# Patient Record
Sex: Female | Born: 1950
Health system: Southern US, Community
[De-identification: ages and names within clinical notes are randomized; demographics above are authoritative.]

## PROBLEM LIST (undated history)

## (undated) DIAGNOSIS — N189 Chronic kidney disease, unspecified: Secondary | ICD-10-CM

## (undated) DIAGNOSIS — E039 Hypothyroidism, unspecified: Secondary | ICD-10-CM

## (undated) DIAGNOSIS — F419 Anxiety disorder, unspecified: Secondary | ICD-10-CM

## (undated) DIAGNOSIS — I10 Essential (primary) hypertension: Secondary | ICD-10-CM

## (undated) DIAGNOSIS — R011 Cardiac murmur, unspecified: Secondary | ICD-10-CM

## (undated) DIAGNOSIS — K219 Gastro-esophageal reflux disease without esophagitis: Secondary | ICD-10-CM

## (undated) DIAGNOSIS — M109 Gout, unspecified: Secondary | ICD-10-CM

## (undated) DIAGNOSIS — K859 Acute pancreatitis without necrosis or infection, unspecified: Secondary | ICD-10-CM

## (undated) DIAGNOSIS — Z972 Presence of dental prosthetic device (complete) (partial): Secondary | ICD-10-CM

## (undated) DIAGNOSIS — Z973 Presence of spectacles and contact lenses: Secondary | ICD-10-CM

## (undated) DIAGNOSIS — M1990 Unspecified osteoarthritis, unspecified site: Secondary | ICD-10-CM

## (undated) DIAGNOSIS — E119 Type 2 diabetes mellitus without complications: Secondary | ICD-10-CM

## (undated) DIAGNOSIS — I350 Nonrheumatic aortic (valve) stenosis: Secondary | ICD-10-CM

## (undated) HISTORY — DX: Nonrheumatic aortic (valve) stenosis: I35.0

## (undated) HISTORY — DX: Type 2 diabetes mellitus without complications: E11.9

## (undated) HISTORY — PX: COLONOSCOPY: SHX174

## (undated) HISTORY — PX: BACK SURGERY: SHX140

## (undated) HISTORY — PX: CHOLECYSTECTOMY: SHX55

## (undated) HISTORY — DX: Morbid (severe) obesity due to excess calories: E66.01

---

## 1998-12-20 ENCOUNTER — Other Ambulatory Visit: Admission: RE | Admit: 1998-12-20 | Discharge: 1998-12-20 | Payer: Self-pay | Admitting: Gynecology

## 1999-11-08 ENCOUNTER — Other Ambulatory Visit: Admission: RE | Admit: 1999-11-08 | Discharge: 1999-11-08 | Payer: Self-pay | Admitting: Gynecology

## 2002-12-31 ENCOUNTER — Ambulatory Visit (HOSPITAL_COMMUNITY): Admission: RE | Admit: 2002-12-31 | Discharge: 2002-12-31 | Payer: Self-pay | Admitting: Gastroenterology

## 2005-02-03 ENCOUNTER — Ambulatory Visit: Payer: Self-pay | Admitting: *Deleted

## 2005-02-03 ENCOUNTER — Ambulatory Visit (HOSPITAL_COMMUNITY): Admission: RE | Admit: 2005-02-03 | Discharge: 2005-02-03 | Payer: Self-pay | Admitting: Family Medicine

## 2005-02-07 ENCOUNTER — Ambulatory Visit: Payer: Self-pay | Admitting: *Deleted

## 2005-02-16 ENCOUNTER — Encounter (HOSPITAL_COMMUNITY): Admission: RE | Admit: 2005-02-16 | Discharge: 2005-02-17 | Payer: Self-pay | Admitting: *Deleted

## 2005-02-17 ENCOUNTER — Ambulatory Visit: Payer: Self-pay | Admitting: Cardiology

## 2005-02-28 ENCOUNTER — Encounter (HOSPITAL_COMMUNITY): Admission: RE | Admit: 2005-02-28 | Discharge: 2005-03-01 | Payer: Self-pay | Admitting: Family Medicine

## 2005-03-03 ENCOUNTER — Ambulatory Visit: Payer: Self-pay | Admitting: *Deleted

## 2005-03-31 ENCOUNTER — Encounter (HOSPITAL_COMMUNITY): Admission: RE | Admit: 2005-03-31 | Discharge: 2005-06-29 | Payer: Self-pay | Admitting: Endocrinology

## 2008-01-24 ENCOUNTER — Ambulatory Visit (HOSPITAL_COMMUNITY): Admission: RE | Admit: 2008-01-24 | Discharge: 2008-01-24 | Payer: Self-pay | Admitting: Family Medicine

## 2009-10-29 ENCOUNTER — Ambulatory Visit (HOSPITAL_COMMUNITY): Admission: RE | Admit: 2009-10-29 | Discharge: 2009-10-29 | Payer: Self-pay | Admitting: Family Medicine

## 2009-11-10 ENCOUNTER — Ambulatory Visit: Admission: RE | Admit: 2009-11-10 | Discharge: 2009-11-10 | Payer: Self-pay | Admitting: Family Medicine

## 2009-11-17 ENCOUNTER — Ambulatory Visit (HOSPITAL_COMMUNITY): Admission: RE | Admit: 2009-11-17 | Discharge: 2009-11-17 | Payer: Self-pay | Admitting: Family Medicine

## 2010-08-05 NOTE — Op Note (Signed)
   NAME:  Sabrina Deleon, Sabrina Deleon                           ACCOUNT NO.:  [id number]   MEDICAL RECORD NO.:  [id number]                   PATIENT TYPE:  AMB   LOCATION:  ENDO                                 FACILITY:  [facility]   PHYSICIAN:  Graylin Shiver, M.D.                DATE OF BIRTH:  12/30/1950   DATE OF PROCEDURE:  12/31/2002  DATE OF DISCHARGE:                                 OPERATIVE REPORT   PROCEDURE:  Colonoscopy.   INDICATIONS FOR PROCEDURE:  Rectal bleeding, screening.   Informed consent was obtained after explanation of the risks of bleeding,  infection and perforation.   PREMEDICATION:  Fentanyl 75 mcg IV, Versed 9 mg IV.   DESCRIPTION OF PROCEDURE:  With the patient in the left lateral decubitus  position, a rectal exam was performed and no masses were felt. The Olympus  colonoscope was then inserted into the rectum and advanced around the colon  to the cecum. Cecal landmarks were identified. The cecum and ascending colon  were normal. The transverse colon was normal.  The descending colon, sigmoid  and rectum were normal. The scope was retroflexed in the rectum, some small  internal hemorrhoids were noted. The patient also had a couple of small  external hemorrhoidal tags. She tolerated the procedure well without  complications.   IMPRESSION:  Normal colonoscopy with the presence of hemorrhoids.                                               Graylin Shiver, M.D.    [name]  D:  12/31/2002  T:  12/31/2002  Job:  [id number]   cc:   Meredith Staggers, M.D.  [address], [address]  [place]  [place] [address]  Fax: [phone]   Scott A. Gerda Diss, M.D.  [address], [address]  [place]  [place] [address]  Fax: [phone]

## 2010-08-05 NOTE — Procedures (Signed)
[name], [name]                 ACCOUNT NO.:  [id number]   MEDICAL RECORD NO.:  [id number]          PATIENT TYPE:  OUT   LOCATION:  RAD                           FACILITY:  [facility]   PHYSICIAN:  Vida Roller, M.D.   DATE OF BIRTH:  [birth year]   DATE OF PROCEDURE:  02/03/2005  DATE OF DISCHARGE:                                  ECHOCARDIOGRAM   PRIMARY CARE PHYSICIAN:  Scott A. Gerda Diss, M.D.   Tape number [id number].  Tape count [id number].   INDICATIONS:  This is a 60 year old woman with shortness of breath.  Technical of the study is poor.  There is multiple nonstandard views.   M-MODE TRACINGS:  The aorta is 33 mm.   Left atrium 46 mm.   Septum is 13 mm.   Posterior wall 13 mm.   Left ventricular diastolic dimension is 56 mm.   Left ventricular systolic dimension 38 mm.   Two-dimensional echocardiogram and Doppler imaging:  The left ventricle is  normal size.  There is preserved LV systolic function and estimated ejection  fraction 55-60%.  No wall motion abnormalities.  Mild LVH is seen which is  concentric.   Right ventricle is normal size with normal systolic function.   Both atria are enlarged.   The aortic valve is sclerotic with mild aortic stenosis peak gradient of 22  mmHg.  Technical quality of the study was poor and therefore an aortic valve  area was not obtained.   The mitral valve has and moderate to severe annular calcification with mild  regurgitation.   Tricuspid valve with moderate regurgitation.  No RVSP was calculated.   There is no pericardial effusion.   Inferior vena cava appeared to be normal size.      Vida Roller, M.D.  Electronically Signed     JH/MEDQ  D:  02/03/2005  T:  02/03/2005  Job:  [id number]

## 2011-10-27 ENCOUNTER — Other Ambulatory Visit: Payer: Self-pay | Admitting: Orthopedic Surgery

## 2011-10-30 ENCOUNTER — Encounter (HOSPITAL_BASED_OUTPATIENT_CLINIC_OR_DEPARTMENT_OTHER): Payer: Self-pay | Admitting: *Deleted

## 2011-10-30 NOTE — Progress Notes (Signed)
To come in for labs and ekg-to bring all meds dos Does not use a cpap-moderate osa

## 2011-10-31 ENCOUNTER — Ambulatory Visit (HOSPITAL_BASED_OUTPATIENT_CLINIC_OR_DEPARTMENT_OTHER)
Admission: RE | Admit: 2011-10-31 | Discharge: 2011-10-31 | Disposition: A | Discharge status: Home / Self Care | Payer: BC Managed Care – PPO | Source: Ambulatory Visit | Attending: Orthopedic Surgery | Admitting: Orthopedic Surgery

## 2011-10-31 DIAGNOSIS — Z01810 Encounter for preprocedural cardiovascular examination: Secondary | ICD-10-CM | POA: Insufficient documentation

## 2011-10-31 LAB — BASIC METABOLIC PANEL
BUN: 31 mg/dL — ABNORMAL HIGH (ref 6–23)
CO2: 29 mEq/L (ref 19–32)
Chloride: 99 mEq/L (ref 96–112)
Creatinine, Ser: 1.16 mg/dL — ABNORMAL HIGH (ref 0.50–1.10)
GFR calc non Af Amer: 50 mL/min — ABNORMAL LOW (ref >90)
Glucose, Bld: 127 mg/dL — ABNORMAL HIGH (ref 70–99)
Potassium: 4.2 mEq/L (ref 3.5–5.1)
Sodium: 137 mEq/L (ref 135–145)

## 2011-11-02 ENCOUNTER — Encounter (HOSPITAL_BASED_OUTPATIENT_CLINIC_OR_DEPARTMENT_OTHER): Payer: Self-pay | Admitting: *Deleted

## 2011-11-02 ENCOUNTER — Ambulatory Visit (HOSPITAL_BASED_OUTPATIENT_CLINIC_OR_DEPARTMENT_OTHER)
Admission: RE | Admit: 2011-11-02 | Discharge: 2011-11-02 | Disposition: A | Discharge status: Home / Self Care | Payer: BC Managed Care – PPO | Source: Ambulatory Visit | Attending: Orthopedic Surgery | Admitting: Orthopedic Surgery

## 2011-11-02 ENCOUNTER — Ambulatory Visit (HOSPITAL_BASED_OUTPATIENT_CLINIC_OR_DEPARTMENT_OTHER): Payer: BC Managed Care – PPO | Admitting: *Deleted

## 2011-11-02 ENCOUNTER — Encounter (HOSPITAL_BASED_OUTPATIENT_CLINIC_OR_DEPARTMENT_OTHER)
Admission: RE | Disposition: A | Discharge status: Home / Self Care | Payer: Self-pay | Source: Ambulatory Visit | Attending: Orthopedic Surgery

## 2011-11-02 ENCOUNTER — Encounter (HOSPITAL_BASED_OUTPATIENT_CLINIC_OR_DEPARTMENT_OTHER): Payer: Self-pay

## 2011-11-02 DIAGNOSIS — G4730 Sleep apnea, unspecified: Secondary | ICD-10-CM | POA: Insufficient documentation

## 2011-11-02 DIAGNOSIS — Z01812 Encounter for preprocedural laboratory examination: Secondary | ICD-10-CM | POA: Insufficient documentation

## 2011-11-02 DIAGNOSIS — I10 Essential (primary) hypertension: Secondary | ICD-10-CM | POA: Insufficient documentation

## 2011-11-02 DIAGNOSIS — G5600 Carpal tunnel syndrome, unspecified upper limb: Secondary | ICD-10-CM | POA: Insufficient documentation

## 2011-11-02 DIAGNOSIS — E119 Type 2 diabetes mellitus without complications: Secondary | ICD-10-CM | POA: Insufficient documentation

## 2011-11-02 HISTORY — DX: Gout, unspecified: M10.9

## 2011-11-02 HISTORY — DX: Gastro-esophageal reflux disease without esophagitis: K21.9

## 2011-11-02 HISTORY — DX: Presence of dental prosthetic device (complete) (partial): Z97.2

## 2011-11-02 HISTORY — DX: Unspecified osteoarthritis, unspecified site: M19.90

## 2011-11-02 HISTORY — DX: Essential (primary) hypertension: I10

## 2011-11-02 HISTORY — DX: Hypothyroidism, unspecified: E03.9

## 2011-11-02 HISTORY — DX: Presence of spectacles and contact lenses: Z97.3

## 2011-11-02 HISTORY — PX: CARPAL TUNNEL RELEASE: SHX101

## 2011-11-02 SURGERY — CARPAL TUNNEL RELEASE
Anesthesia: Choice | Site: Hand | Laterality: Right | Wound class: Clean

## 2011-11-02 MED ORDER — HYDROMORPHONE HCL PF 1 MG/ML IJ SOLN: 0.2500 mg | INTRAMUSCULAR | Status: DC | PRN

## 2011-11-02 MED ORDER — MIDAZOLAM HCL 5 MG/5ML IJ SOLN
INTRAMUSCULAR | Status: DC | PRN
  Administered 2011-11-02: 1 mg via INTRAVENOUS

## 2011-11-02 MED ORDER — BUPIVACAINE HCL (PF) 0.25 % IJ SOLN
INTRAMUSCULAR | Status: DC | PRN
  Administered 2011-11-02: 10 mL

## 2011-11-02 MED ORDER — ONDANSETRON HCL 4 MG/2ML IJ SOLN
INTRAMUSCULAR | Status: DC | PRN
  Administered 2011-11-02: 4 mg via INTRAVENOUS

## 2011-11-02 MED ORDER — PROPOFOL 10 MG/ML IV EMUL
INTRAVENOUS | Status: DC | PRN
  Administered 2011-11-02: 200 ug/kg/min via INTRAVENOUS

## 2011-11-02 MED ORDER — ONDANSETRON HCL 4 MG/2ML IJ SOLN: 4.0000 mg | Freq: Once | INTRAMUSCULAR | Status: DC | PRN

## 2011-11-02 MED ORDER — FENTANYL CITRATE 0.05 MG/ML IJ SOLN
INTRAMUSCULAR | Status: DC | PRN
  Administered 2011-11-02: 50 ug via INTRAVENOUS
  Administered 2011-11-02: 25 ug via INTRAVENOUS

## 2011-11-02 MED ORDER — OXYCODONE-ACETAMINOPHEN 5-325 MG PO TABS: ORAL_TABLET | ORAL | Status: AC

## 2011-11-02 MED ORDER — LIDOCAINE HCL (CARDIAC) 20 MG/ML IV SOLN
INTRAVENOUS | Status: DC | PRN
  Administered 2011-11-02: 20 mg via INTRAVENOUS

## 2011-11-02 MED ORDER — CEFAZOLIN SODIUM-DEXTROSE 2-3 GM-% IV SOLR
2.0000 g | Freq: Once | INTRAVENOUS | Status: AC
  Administered 2011-11-02: 2 g via INTRAVENOUS

## 2011-11-02 MED ORDER — LACTATED RINGERS IV SOLN
INTRAVENOUS | Status: DC
  Administered 2011-11-02: 09:00:00 via INTRAVENOUS

## 2011-11-02 SURGICAL SUPPLY — 36 items
BANDAGE ELASTIC 3 VELCRO ST LF (GAUZE/BANDAGES/DRESSINGS) ×2 IMPLANT
BANDAGE GAUZE ELAST BULKY 4 IN (GAUZE/BANDAGES/DRESSINGS) ×2 IMPLANT
BLADE MINI RND TIP GREEN BEAV (BLADE) IMPLANT
BLADE SURG 15 STRL LF DISP TIS (BLADE) ×2 IMPLANT
BLADE SURG 15 STRL SS (BLADE) ×4
BNDG CMPR 9X4 STRL LF SNTH (GAUZE/BANDAGES/DRESSINGS)
BNDG ESMARK 4X9 LF (GAUZE/BANDAGES/DRESSINGS) IMPLANT
CHLORAPREP W/TINT 26ML (MISCELLANEOUS) ×2 IMPLANT
CLOTH BEACON ORANGE TIMEOUT ST (SAFETY) ×2 IMPLANT
CORDS BIPOLAR (ELECTRODE) ×2 IMPLANT
COVER MAYO STAND STRL (DRAPES) ×2 IMPLANT
COVER TABLE BACK 60X90 (DRAPES) ×2 IMPLANT
CUFF TOURNIQUET SINGLE 18IN (TOURNIQUET CUFF) ×2 IMPLANT
DRAPE EXTREMITY T 121X128X90 (DRAPE) ×2 IMPLANT
DRAPE SURG 17X23 STRL (DRAPES) ×2 IMPLANT
DRSG PAD ABDOMINAL 8X10 ST (GAUZE/BANDAGES/DRESSINGS) ×2 IMPLANT
GAUZE XEROFORM 1X8 LF (GAUZE/BANDAGES/DRESSINGS) ×2 IMPLANT
GLOVE BIO SURGEON STRL SZ7.5 (GLOVE) ×2 IMPLANT
GLOVE BIOGEL PI IND STRL 8 (GLOVE) IMPLANT
GLOVE BIOGEL PI INDICATOR 8 (GLOVE) ×1
GOWN PREVENTION PLUS XLARGE (GOWN DISPOSABLE) ×2 IMPLANT
GOWN STRL REIN XL XLG (GOWN DISPOSABLE) ×2 IMPLANT
NDL HYPO 25X1 1.5 SAFETY (NEEDLE) IMPLANT
NEEDLE HYPO 25X1 1.5 SAFETY (NEEDLE) ×2 IMPLANT
NS IRRIG 1000ML POUR BTL (IV SOLUTION) ×2 IMPLANT
PACK BASIN DAY SURGERY FS (CUSTOM PROCEDURE TRAY) ×2 IMPLANT
PADDING CAST ABS 4INX4YD NS (CAST SUPPLIES)
PADDING CAST ABS COTTON 4X4 ST (CAST SUPPLIES) ×1 IMPLANT
SPONGE GAUZE 4X4 12PLY (GAUZE/BANDAGES/DRESSINGS) ×2 IMPLANT
STOCKINETTE 4X48 STRL (DRAPES) ×2 IMPLANT
SUT ETHILON 4 0 PS 2 18 (SUTURE) ×2 IMPLANT
SYR BULB 3OZ (MISCELLANEOUS) ×2 IMPLANT
SYR CONTROL 10ML LL (SYRINGE) ×1 IMPLANT
TOWEL OR 17X24 6PK STRL BLUE (TOWEL DISPOSABLE) ×4 IMPLANT
UNDERPAD 30X30 INCONTINENT (UNDERPADS AND DIAPERS) ×2 IMPLANT
WATER STERILE IRR 1000ML POUR (IV SOLUTION) ×1 IMPLANT

## 2011-11-02 NOTE — Anesthesia Procedure Notes (Addendum)
Procedure Name: MAC Date/Time: 11/02/2011 10:12 AM Performed by: Meyer Russel Pre-anesthesia Checklist: Patient identified, Emergency Drugs available, Suction available, Patient being monitored and Timeout performed Patient Re-evaluated:Patient Re-evaluated prior to inductionOxygen Delivery Method: Simple face mask Preoxygenation: Pre-oxygenation with 100% oxygen Ventilation: Mask ventilation without difficulty   Anesthesia Regional Block:  Bier block (IV Regional)  Pre-Anesthetic Checklist: ,, timeout performed, Correct Patient, Correct Site, Correct Laterality, Correct Procedure,, site marked, surgical consent,, at surgeon's request   Prep: chloraprep       Needles:  Injection technique: Single-shot  Needle Type: Other   (20 ga IV cath)    Needle Gauge: 22 and 22 G    Additional Needles: Bier block (IV Regional) Narrative:   Performed by: Personally

## 2011-11-02 NOTE — Anesthesia Preprocedure Evaluation (Addendum)
Anesthesia Evaluation  Patient identified by MRN, date of birth, ID band Patient awake    Reviewed: Allergy & Precautions, H&P , NPO status , Patient's Chart, lab work & pertinent test results  Airway Mallampati: I TM Distance: >3 FB Neck ROM: Full    Dental  (+) Teeth Intact, Partial Upper and Dental Advisory Given   Pulmonary sleep apnea (no cpap) ,  breath sounds clear to auscultation        Cardiovascular hypertension, Pt. on medications Rhythm:Regular Rate:Normal     Neuro/Psych    GI/Hepatic   Endo/Other  Morbid obesity  Renal/GU      Musculoskeletal   Abdominal   Peds  Hematology   Anesthesia Other Findings   Reproductive/Obstetrics                           Anesthesia Physical Anesthesia Plan  ASA: III  Anesthesia Plan: General   Post-op Pain Management:    Induction: Intravenous  Airway Management Planned: Simple Face Mask  Additional Equipment:   Intra-op Plan:   Post-operative Plan: Extubation in OR  Informed Consent: I have reviewed the patients History and Physical, chart, labs and discussed the procedure including the risks, benefits and alternatives for the proposed anesthesia with the patient or authorized representative who has indicated his/her understanding and acceptance.   Dental advisory given  Plan Discussed with: CRNA, Anesthesiologist and Surgeon  Anesthesia Plan Comments:         Anesthesia Quick Evaluation

## 2011-11-02 NOTE — H&P (Signed)
Sabrina Deleon is an 61 y.o. female.   Chief Complaint: carpal tunnel syndrome HPI: 61 yo female with pins and needles sensation in bilateral hands x several years.  Nocturnal symptoms nightly.  Nerve conduction studies positive for bilateral carpal tunnel syndrome.  Past Medical History  Diagnosis Date  . Hypertension   . Diabetes mellitus   . Hypothyroidism   . Arthritis   . GERD (gastroesophageal reflux disease)   . Sleep apnea     moderate -does not use a cpap-is supposed to  . Gout   . Shortness of breath   . Wears glasses   . Wears partial dentures     upper    Past Surgical History  Procedure Date  . Colonoscopy   . Cholecystectomy   . Back surgery     lumb lam    History reviewed. No pertinent family history. Social History:  reports that she quit smoking about 30 years ago. She does not have any smokeless tobacco history on file. She reports that she drinks alcohol. She reports that she does not use illicit drugs.  Allergies: No Known Allergies  Medications Prior to Admission  Medication Sig Dispense Refill  . allopurinol (ZYLOPRIM) 100 MG tablet Take 100 mg by mouth daily.      Marland Kitchen aspirin 81 MG tablet Take 81 mg by mouth daily.      . citalopram (CELEXA) 20 MG tablet Take 20 mg by mouth daily.      . furosemide (LASIX) 40 MG tablet Take 40 mg by mouth daily.      Marland Kitchen glipiZIDE (GLUCOTROL) 10 MG tablet Take 10 mg by mouth 2 (two) times daily before a meal.      . HYDROcodone-acetaminophen (VICODIN) 5-500 MG per tablet Take 1 tablet by mouth every 6 (six) hours as needed.      . indomethacin (INDOCIN) 50 MG capsule Take 50 mg by mouth as needed.      Marland Kitchen levothyroxine (SYNTHROID, LEVOTHROID) 150 MCG tablet Take 150 mcg by mouth daily.      Marland Kitchen lisinopril (PRINIVIL,ZESTRIL) 20 MG tablet Take 20 mg by mouth daily.      . metFORMIN (GLUCOPHAGE) 500 MG tablet Take 500 mg by mouth 2 (two) times daily with a meal.      . nabumetone (RELAFEN) 500 MG tablet Take 500 mg by mouth  daily.      Marland Kitchen omeprazole (PRILOSEC) 20 MG capsule Take 20 mg by mouth daily.      . pravastatin (PRAVACHOL) 20 MG tablet Take 40 mg by mouth daily.       Marland Kitchen zolpidem (AMBIEN) 10 MG tablet Take 10 mg by mouth at bedtime as needed. Takes 1/2        Results for orders placed during the hospital encounter of 11/02/11 (from the past 48 hour(s))  GLUCOSE, CAPILLARY     Status: Abnormal   Collection Time   11/02/11  8:57 AM      Component Value Range Comment   Glucose-Capillary 128 (*) 70 - 99 mg/dL   POCT HEMOGLOBIN-HEMACUE     Status: Normal   Collection Time   11/02/11  8:59 AM      Component Value Range Comment   Hemoglobin 13.4  12.0 - 15.0 g/dL     No results found.   A comprehensive review of systems was negative except for: Eyes: positive for contacts/glasses Ears, nose, mouth, throat, and face: positive for tinnitus Behavioral/Psych: positive for depression  Blood pressure 136/85,  pulse 79, temperature 98.2 F (36.8 C), temperature source Oral, resp. rate 20, height 5\' 9"  (1.753 m), weight 131.543 kg (290 lb), SpO2 98.00%.  General appearance: alert, cooperative and appears stated age Head: Normocephalic, without obvious abnormality, atraumatic Neck: supple, symmetrical, trachea midline Resp: clear to auscultation bilaterally Cardio: regular rate and rhythm GI: soft, non-tender; bowel sounds normal; no masses,  no organomegaly Extremities: light touch sensation and capillary refill intact all digits.  +epl/fpl/io.  positive tinels, phalens, durkins. Pulses: 2+ and symmetric Skin: Skin color, texture, turgor normal. No rashes or lesions Neurologic: Grossly normal Incision/Wound: na  Assessment/Plan Bilateral carpal tunnel syndrome.  Discussed non operative and operative treatment options with patient.  She wishes to proceed with right carpal tunnel release.  Risks, benefits, and alternatives of surgery were discussed and the patient agrees with the plan of  care.   Sheilyn Boehlke R 11/02/2011, 9:53 AM

## 2011-11-02 NOTE — Transfer of Care (Signed)
Immediate Anesthesia Transfer of Care Note  Patient: Sabrina Deleon  Procedure(s) Performed: Procedure(s) (LRB): CARPAL TUNNEL RELEASE (Right)  Patient Location: PACU  Anesthesia Type: Bier block  Level of Consciousness: awake, alert  and oriented  Airway & Oxygen Therapy: Patient Spontanous Breathing  Post-op Assessment: Report given to PACU RN, Post -op Vital signs reviewed and stable and Patient moving all extremities  Post vital signs: Reviewed and stable  Complications: No apparent anesthesia complications

## 2011-11-02 NOTE — Op Note (Signed)
Dictation [phone]

## 2011-11-02 NOTE — Anesthesia Postprocedure Evaluation (Signed)
  Anesthesia Post-op Note  Patient: Sabrina Deleon  Procedure(s) Performed: Procedure(s) (LRB): CARPAL TUNNEL RELEASE (Right)  Patient Location: PACU  Anesthesia Type: General  Level of Consciousness: awake, alert  and oriented  Airway and Oxygen Therapy: Patient Spontanous Breathing and Patient connected to face mask oxygen  Post-op Pain: mild  Post-op Assessment: Post-op Vital signs reviewed  Post-op Vital Signs: Reviewed  Complications: No apparent anesthesia complications

## 2011-11-03 NOTE — Op Note (Signed)
Sabrina Deleon, Sabrina Deleon                ACCOUNT NO.:  [id number]  MEDICAL RECORD NO.:  [id number]  LOCATION:                                 FACILITY:  PHYSICIAN:  Betha Loa, MD             DATE OF BIRTH:  DATE OF PROCEDURE:  11/02/2011 DATE OF DISCHARGE:                              OPERATIVE REPORT   PREOPERATIVE DIAGNOSIS:  Right carpal tunnel syndrome.  POSTOPERATIVE DIAGNOSIS:  Right carpal tunnel syndrome.  PROCEDURE:  Right carpal tunnel release.  SURGEON:  Betha Loa, MD  ASSISTANT:  None.  ANESTHESIA:  General, Bier block with sedation.  IV FLUIDS:  Per anesthesia flow sheet.  ESTIMATED BLOOD LOSS:  Minimal.  COMPLICATIONS:  None.  SPECIMENS:  None.  TOURNIQUET TIME:  26 minutes.  DISPOSITION:  Stable to PACU.  INDICATIONS:  Sabrina Deleon is a 61 year old right-hand-dominant female, who has noted pins and needle sensation in bilateral hands for many years. She has nocturnal symptoms every night.  It wakes her and cause her to shake her hand to get relief.  She has positive nerve conduction studies for carpal tunnel syndrome.  She wishes to have a carpal tunnel release for management of symptoms of the right hand.  Risks, benefits, and alternatives pf surgery were discussed including risk of blood loss, infection, damage to nerves, vessels, tendons, ligaments, bone; failure of surgery; need for additional surgery, complications, wound healing, continued pain, continued carpal tunnel syndrome.  She voiced understanding of these risks and elected to proceed.  OPERATIVE COURSE:  After being identified preoperatively by myself, the patient and I agreed upon procedure and site procedure.  The surgical site was marked.  The risks, benefits, and alternatives of surgery were reviewed and she wished to proceed.  Surgical consent had been signed. She was given 2 g of IV Ancef as preoperative antibiotic prophylaxis. She was transferred to the operating room and placed on  the operating table in supine position with the right upper extremity on arm board. Bier block anesthesia was induced by anesthesiologist.  The right upper extremity was prepped and draped in normal sterile orthopedic fashion. Surgical pause was performed between surgeons, anesthesia, operating staff, and all were in agreement as to the patient, procedure, and site of procedure.  Tourniquet at the proximal aspect of the forearm had been inflated for the Bier block.  The patient was under sedation.  A incision was made over the transverse carpal ligament and carried into subcutaneous tissues by spreading technique.  Bipolar electrocautery was used to obtain hemostasis.  The transverse carpal ligament was identified and incised sharply.  It was incised in its distal direction. Care was taken to ensure complete release of the transverse carpal ligament.  It was incised in its proximal direction and the distal aspect of the volar antebrachial fascia was split using the scissors.  A finger was placed into the wound to ensure appropriate decompression, which was the case.  The ulnar nerve was hyperemic and irritated looking.  The motor branch was identified and was intact.  The wound was copiously irrigated with sterile saline.  It was closed with 4-0 nylon in  a horizontal mattress fashion.  It was injected with 10 mL of 0.25% plain Marcaine to aid in postoperative analgesia.  The wound was then dressed with sterile Xeroform, 4x4s, and ABD and wrapped with Kerlix and Ace bandage.  Tourniquet deflated at 26 minutes.  The fingertips were pink with brisk capillary refill after deflation of tourniquet.  Operative drapes were broken down.  The patient was awoken from anesthesia safely. She was transferred back to stretcher and taken to PACU in stable condition.  I will see her back in the office in 1 week for postoperative followup.  I will give Percocet 5/325 mg 1-2 p.o. q.6 hours p.r.n. pain,  dispensed.     Betha Loa, MD     KK/MEDQ  D:  11/02/2011  T:  11/03/2011  Job:  [id number]

## 2011-11-06 ENCOUNTER — Encounter (HOSPITAL_BASED_OUTPATIENT_CLINIC_OR_DEPARTMENT_OTHER): Payer: Self-pay | Admitting: Orthopedic Surgery

## 2011-12-26 ENCOUNTER — Other Ambulatory Visit: Payer: Self-pay | Admitting: Orthopedic Surgery

## 2012-01-25 ENCOUNTER — Encounter (HOSPITAL_BASED_OUTPATIENT_CLINIC_OR_DEPARTMENT_OTHER): Admission: RE | Payer: Self-pay | Source: Ambulatory Visit

## 2012-01-25 ENCOUNTER — Ambulatory Visit (HOSPITAL_BASED_OUTPATIENT_CLINIC_OR_DEPARTMENT_OTHER)
Admission: RE | Admit: 2012-01-25 | Payer: BC Managed Care – PPO | Source: Ambulatory Visit | Admitting: Orthopedic Surgery

## 2012-01-25 SURGERY — CARPAL TUNNEL RELEASE
Anesthesia: Regional | Laterality: Left

## 2012-02-12 ENCOUNTER — Encounter (HOSPITAL_BASED_OUTPATIENT_CLINIC_OR_DEPARTMENT_OTHER): Payer: Self-pay

## 2012-02-12 ENCOUNTER — Ambulatory Visit (HOSPITAL_BASED_OUTPATIENT_CLINIC_OR_DEPARTMENT_OTHER): Admit: 2012-02-12 | Payer: Self-pay | Admitting: Orthopedic Surgery

## 2012-02-12 SURGERY — CARPAL TUNNEL RELEASE
Anesthesia: Choice | Laterality: Right

## 2012-06-04 ENCOUNTER — Encounter: Payer: Self-pay | Admitting: Nurse Practitioner

## 2012-06-04 ENCOUNTER — Ambulatory Visit (HOSPITAL_COMMUNITY)
Admission: RE | Admit: 2012-06-04 | Discharge: 2012-06-04 | Disposition: A | Discharge status: Home / Self Care | Payer: 59 | Source: Ambulatory Visit | Attending: Nurse Practitioner | Admitting: Nurse Practitioner

## 2012-06-04 ENCOUNTER — Ambulatory Visit (INDEPENDENT_AMBULATORY_CARE_PROVIDER_SITE_OTHER): Payer: 59 | Admitting: Nurse Practitioner

## 2012-06-04 VITALS — BP 132/80 | HR 70 | Temp 98.3°F

## 2012-06-04 DIAGNOSIS — M25522 Pain in left elbow: Secondary | ICD-10-CM

## 2012-06-04 DIAGNOSIS — M109 Gout, unspecified: Secondary | ICD-10-CM

## 2012-06-04 DIAGNOSIS — M25529 Pain in unspecified elbow: Secondary | ICD-10-CM | POA: Insufficient documentation

## 2012-06-04 DIAGNOSIS — M25629 Stiffness of unspecified elbow, not elsewhere classified: Secondary | ICD-10-CM | POA: Insufficient documentation

## 2012-06-04 NOTE — Patient Instructions (Addendum)
Your physician recommends that you continue on your current medications as directed. Please refer to the Current Medication list given to you today.     

## 2012-06-04 NOTE — Progress Notes (Signed)
  Subjective:    Patient ID: Sabrina Deleon, female    DOB: May 19, 1950, 62 y.o.   MRN: [id number]  HPI presents with complaints of severe left elbow pain for the past 2 days. No specific history of injury. Has gradually gotten worse, now cannot move elbow at all. No neck or shoulder pain. No wrist pain. Short-term relief with indomethacin and hydrocodone.    Review of Systems has a history of gout. Currently on allopurinol. No erythema warmth of the joint. No fever. No other joint involvement.     Objective:   Physical Exam NAD. Alert, oriented. Lungs clear. Heart regular rate rhythm. Good ROM of the left wrist without tenderness. Limited ROM of the left shoulder due to elbow pain. No shoulder joint line tenderness. Generalized tenderness in the elbow, more so towards the lateral epicondyle. Very limited movement of the elbow. Cannot fully extend.       Assessment & Plan:  Assessment: #1 left elbow pain #2 gout Plan: #1 obtain uric acid level. X-ray of the left elbow. Continue indomethacin and Vicodin as directed (patient has refills). Further followup based on test results.

## 2012-06-05 ENCOUNTER — Other Ambulatory Visit: Payer: Self-pay | Admitting: Nurse Practitioner

## 2012-06-05 LAB — URIC ACID: Uric Acid, Serum: 9.2 mg/dL — ABNORMAL HIGH (ref 2.4–7.0)

## 2012-06-05 MED ORDER — PREDNISONE 20 MG PO TABS: ORAL_TABLET | ORAL | Status: DC

## 2012-06-05 NOTE — Addendum Note (Signed)
Addended by: Campbell Riches on: 06/05/2012 11:11 AM   Modules accepted: Orders

## 2012-06-10 ENCOUNTER — Encounter: Payer: Self-pay | Admitting: *Deleted

## 2012-06-10 ENCOUNTER — Other Ambulatory Visit: Payer: Self-pay | Admitting: *Deleted

## 2012-06-10 DIAGNOSIS — E785 Hyperlipidemia, unspecified: Secondary | ICD-10-CM | POA: Insufficient documentation

## 2012-06-10 DIAGNOSIS — IMO0001 Reserved for inherently not codable concepts without codable children: Secondary | ICD-10-CM

## 2012-06-10 DIAGNOSIS — E038 Other specified hypothyroidism: Secondary | ICD-10-CM

## 2012-06-10 DIAGNOSIS — E039 Hypothyroidism, unspecified: Secondary | ICD-10-CM | POA: Insufficient documentation

## 2012-06-10 DIAGNOSIS — I10 Essential (primary) hypertension: Secondary | ICD-10-CM

## 2012-06-10 MED ORDER — METFORMIN HCL 500 MG PO TABS: 500.0000 mg | ORAL_TABLET | Freq: Two times a day (BID) | ORAL | Status: DC

## 2012-07-10 ENCOUNTER — Other Ambulatory Visit: Payer: Self-pay | Admitting: Family Medicine

## 2012-07-17 ENCOUNTER — Other Ambulatory Visit (HOSPITAL_COMMUNITY): Payer: Self-pay | Admitting: Family Medicine

## 2012-08-02 ENCOUNTER — Other Ambulatory Visit (HOSPITAL_COMMUNITY): Payer: Self-pay | Admitting: Family Medicine

## 2012-08-14 ENCOUNTER — Other Ambulatory Visit (HOSPITAL_COMMUNITY): Payer: Self-pay | Admitting: Family Medicine

## 2012-08-14 MED ORDER — LEVOTHYROXINE SODIUM 112 MCG PO TABS: 112.0000 ug | ORAL_TABLET | Freq: Every day | ORAL | Status: DC

## 2012-08-30 ENCOUNTER — Other Ambulatory Visit (HOSPITAL_COMMUNITY): Payer: Self-pay | Admitting: Family Medicine

## 2012-08-31 ENCOUNTER — Other Ambulatory Visit: Payer: Self-pay | Admitting: Family Medicine

## 2012-08-31 ENCOUNTER — Other Ambulatory Visit (HOSPITAL_COMMUNITY): Payer: Self-pay | Admitting: Family Medicine

## 2012-09-09 ENCOUNTER — Other Ambulatory Visit (HOSPITAL_COMMUNITY): Payer: Self-pay | Admitting: Family Medicine

## 2012-09-12 ENCOUNTER — Other Ambulatory Visit (HOSPITAL_COMMUNITY): Payer: Self-pay | Admitting: Family Medicine

## 2012-09-15 ENCOUNTER — Other Ambulatory Visit (HOSPITAL_COMMUNITY): Payer: Self-pay | Admitting: Family Medicine

## 2012-09-27 ENCOUNTER — Other Ambulatory Visit: Payer: Self-pay | Admitting: Family Medicine

## 2012-09-27 NOTE — Telephone Encounter (Signed)
Ok this and 4 refills 

## 2012-09-27 NOTE — Telephone Encounter (Signed)
RX called in .

## 2012-10-28 ENCOUNTER — Other Ambulatory Visit (HOSPITAL_COMMUNITY): Payer: Self-pay | Admitting: Family Medicine

## 2012-12-13 ENCOUNTER — Emergency Department (HOSPITAL_COMMUNITY): Payer: 59

## 2012-12-13 ENCOUNTER — Encounter (HOSPITAL_COMMUNITY): Payer: Self-pay

## 2012-12-13 ENCOUNTER — Other Ambulatory Visit: Payer: Self-pay

## 2012-12-13 ENCOUNTER — Other Ambulatory Visit: Payer: Self-pay | Admitting: Family Medicine

## 2012-12-13 ENCOUNTER — Emergency Department (HOSPITAL_COMMUNITY)
Admission: EM | Admit: 2012-12-13 | Discharge: 2012-12-13 | Disposition: A | Discharge status: Home / Self Care | Payer: 59 | Attending: Emergency Medicine | Admitting: Emergency Medicine

## 2012-12-13 DIAGNOSIS — Z79899 Other long term (current) drug therapy: Secondary | ICD-10-CM | POA: Insufficient documentation

## 2012-12-13 DIAGNOSIS — Z7982 Long term (current) use of aspirin: Secondary | ICD-10-CM | POA: Insufficient documentation

## 2012-12-13 DIAGNOSIS — K219 Gastro-esophageal reflux disease without esophagitis: Secondary | ICD-10-CM | POA: Insufficient documentation

## 2012-12-13 DIAGNOSIS — Z87891 Personal history of nicotine dependence: Secondary | ICD-10-CM | POA: Insufficient documentation

## 2012-12-13 DIAGNOSIS — M109 Gout, unspecified: Secondary | ICD-10-CM | POA: Insufficient documentation

## 2012-12-13 DIAGNOSIS — E119 Type 2 diabetes mellitus without complications: Secondary | ICD-10-CM | POA: Insufficient documentation

## 2012-12-13 DIAGNOSIS — Y92410 Unspecified street and highway as the place of occurrence of the external cause: Secondary | ICD-10-CM | POA: Insufficient documentation

## 2012-12-13 DIAGNOSIS — S20219A Contusion of unspecified front wall of thorax, initial encounter: Secondary | ICD-10-CM | POA: Insufficient documentation

## 2012-12-13 DIAGNOSIS — S20222A Contusion of left back wall of thorax, initial encounter: Secondary | ICD-10-CM

## 2012-12-13 DIAGNOSIS — Y9389 Activity, other specified: Secondary | ICD-10-CM | POA: Insufficient documentation

## 2012-12-13 DIAGNOSIS — I10 Essential (primary) hypertension: Secondary | ICD-10-CM | POA: Insufficient documentation

## 2012-12-13 DIAGNOSIS — G4730 Sleep apnea, unspecified: Secondary | ICD-10-CM | POA: Insufficient documentation

## 2012-12-13 MED ORDER — HYDROCODONE-ACETAMINOPHEN 5-325 MG PO TABS: 1.0000 | ORAL_TABLET | ORAL | Status: DC | PRN

## 2012-12-13 MED ORDER — HYDROCODONE-ACETAMINOPHEN 5-325 MG PO TABS
1.0000 | ORAL_TABLET | Freq: Once | ORAL | Status: AC
  Administered 2012-12-13: 1 via ORAL
  Filled 2012-12-13: qty 1

## 2012-12-13 NOTE — ED Notes (Addendum)
Alert, talking, Pain lt ribs, shoulder, NO LOC, no N/V  MAE.  Contusion to lt upper chest and upper lt back

## 2012-12-13 NOTE — ED Notes (Signed)
Pt became sweaty during EKG,, thinks her  bs is low,  Family gave her drink and crackers
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
## 2012-12-13 NOTE — ED Notes (Signed)
Alert, talking, Dr Manus Gunning  In to see pt with  [name].  Family at bedside.
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
## 2012-12-13 NOTE — ED Notes (Signed)
Sitting up in chair , no distress

## 2012-12-14 ENCOUNTER — Other Ambulatory Visit: Payer: Self-pay | Admitting: Nurse Practitioner

## 2012-12-16 NOTE — Telephone Encounter (Signed)
May refill times one 

## 2012-12-17 ENCOUNTER — Encounter: Payer: Self-pay | Admitting: Family Medicine

## 2012-12-17 DIAGNOSIS — E11319 Type 2 diabetes mellitus with unspecified diabetic retinopathy without macular edema: Secondary | ICD-10-CM | POA: Insufficient documentation

## 2013-01-23 ENCOUNTER — Other Ambulatory Visit (HOSPITAL_COMMUNITY): Payer: Self-pay | Admitting: Family Medicine

## 2013-02-13 ENCOUNTER — Other Ambulatory Visit (HOSPITAL_COMMUNITY): Payer: Self-pay | Admitting: Family Medicine

## 2013-02-24 ENCOUNTER — Telehealth: Payer: Self-pay | Admitting: Family Medicine

## 2013-02-24 ENCOUNTER — Other Ambulatory Visit: Payer: Self-pay | Admitting: *Deleted

## 2013-02-24 MED ORDER — HYDROCODONE-ACETAMINOPHEN 5-325 MG PO TABS: ORAL_TABLET | ORAL | Status: DC

## 2013-02-24 NOTE — Telephone Encounter (Signed)
#  1-she may have a refill of hydrocodone. #2 explained to the patient that hydrocodone is now a schedule 2 drugs he does require office visit every 3 months when new prescriptions are needed. #3 she is overdue for a standard office visit she ought to do this within the next 30-60 days.

## 2013-02-24 NOTE — Telephone Encounter (Signed)
Patient needs refill on hydrocodone 5/325. °

## 2013-02-24 NOTE — Telephone Encounter (Signed)
Pt notified needs appt for further refills

## 2013-03-10 ENCOUNTER — Other Ambulatory Visit (HOSPITAL_COMMUNITY): Payer: Self-pay | Admitting: Family Medicine

## 2013-03-14 ENCOUNTER — Other Ambulatory Visit (HOSPITAL_COMMUNITY): Payer: Self-pay | Admitting: Family Medicine

## 2013-04-03 ENCOUNTER — Other Ambulatory Visit: Payer: Self-pay | Admitting: Family Medicine

## 2013-04-04 NOTE — Telephone Encounter (Signed)
May approve x2 this patient is a diabetic it is in her best interest to have an appointment within the next 4-6 weeks.

## 2013-04-06 ENCOUNTER — Other Ambulatory Visit (HOSPITAL_COMMUNITY): Payer: Self-pay | Admitting: Family Medicine

## 2013-05-01 ENCOUNTER — Other Ambulatory Visit (HOSPITAL_COMMUNITY): Payer: Self-pay | Admitting: Family Medicine

## 2013-05-01 ENCOUNTER — Other Ambulatory Visit: Payer: Self-pay | Admitting: *Deleted

## 2013-05-12 ENCOUNTER — Encounter: Payer: Self-pay | Admitting: Family Medicine

## 2013-05-12 ENCOUNTER — Ambulatory Visit (INDEPENDENT_AMBULATORY_CARE_PROVIDER_SITE_OTHER): Payer: BC Managed Care – PPO | Admitting: Family Medicine

## 2013-05-12 VITALS — BP 120/78 | Ht 68.5 in | Wt 280.5 lb

## 2013-05-12 DIAGNOSIS — IMO0001 Reserved for inherently not codable concepts without codable children: Secondary | ICD-10-CM

## 2013-05-12 DIAGNOSIS — E119 Type 2 diabetes mellitus without complications: Secondary | ICD-10-CM

## 2013-05-12 DIAGNOSIS — E038 Other specified hypothyroidism: Secondary | ICD-10-CM

## 2013-05-12 DIAGNOSIS — E1165 Type 2 diabetes mellitus with hyperglycemia: Secondary | ICD-10-CM

## 2013-05-12 DIAGNOSIS — I059 Rheumatic mitral valve disease, unspecified: Secondary | ICD-10-CM

## 2013-05-12 DIAGNOSIS — Z79899 Other long term (current) drug therapy: Secondary | ICD-10-CM

## 2013-05-12 DIAGNOSIS — I340 Nonrheumatic mitral (valve) insufficiency: Secondary | ICD-10-CM

## 2013-05-12 DIAGNOSIS — E785 Hyperlipidemia, unspecified: Secondary | ICD-10-CM

## 2013-05-12 DIAGNOSIS — M109 Gout, unspecified: Secondary | ICD-10-CM | POA: Insufficient documentation

## 2013-05-12 DIAGNOSIS — I10 Essential (primary) hypertension: Secondary | ICD-10-CM

## 2013-05-12 LAB — POCT GLYCOSYLATED HEMOGLOBIN (HGB A1C): HEMOGLOBIN A1C: 6.5

## 2013-05-12 NOTE — Progress Notes (Signed)
Subjective:    Patient ID: Sabrina Deleon, female    DOB: 08/02/1950, 63 y.o.   MRN: [id number]  Diabetes She presents for her follow-up diabetic visit. She has type 2 diabetes mellitus. Her disease course has been stable. There are no hypoglycemic associated symptoms. Pertinent negatives for hypoglycemia include no headaches. There are no diabetic associated symptoms. Pertinent negatives for diabetes include no blurred vision, no chest pain, no fatigue and no weakness. There are no hypoglycemic complications. Symptoms are stable. There are no diabetic complications. Pertinent negatives for diabetic complications include no CVA. There are no known risk factors for coronary artery disease. Current diabetic treatment includes oral agent (monotherapy). She is compliant with treatment all of the time.  Hyperlipidemia This is a chronic problem. The current episode started more than 1 year ago. The problem is controlled. Recent lipid tests were reviewed and are variable. Exacerbating diseases include diabetes, hypothyroidism and obesity. She has no history of liver disease. There are no known factors aggravating her hyperlipidemia. Pertinent negatives include no chest pain, focal weakness or leg pain. Current antihyperlipidemic treatment includes diet change, exercise and statins. The current treatment provides moderate improvement of lipids. There are no compliance problems.   Hypertension This is a chronic problem. The current episode started more than 1 year ago. The problem is unchanged. The problem is controlled. Pertinent negatives include no anxiety, blurred vision, chest pain, headaches or neck pain. There are no associated agents to hypertension. Risk factors for coronary artery disease include diabetes mellitus, dyslipidemia, family history, obesity and sedentary lifestyle. Past treatments include ACE inhibitors. The current treatment provides mild improvement. There are no compliance problems.  There  is no history of angina, kidney disease, CVA, heart failure or left ventricular hypertrophy.  Arthritis Presents for follow-up visit. The disease course has been stable. She complains of pain. Affected locations include the right knee and left knee. Her pain is at a severity of 5/10. Associated symptoms include pain while resting. Pertinent negatives include no diarrhea, dry eyes, fatigue, fever, pain at night or rash. Her past medical history is significant for osteoarthritis. There is no history of rheumatoid arthritis.  Past treatments include NSAIDs and an opioid. The treatment provided significant relief. Compliance with prior treatments has been good. Compliance with total regimen is 76-100%.  Patient has no concerns at this time. A1C today is 6.5.    Review of Systems  Constitutional: Negative for fever, activity change, appetite change and fatigue.  HENT: Negative for congestion, ear discharge and rhinorrhea.   Eyes: Negative for blurred vision and discharge.  Respiratory: Negative for cough, chest tightness and wheezing.   Cardiovascular: Negative for chest pain.  Gastrointestinal: Negative for vomiting, abdominal pain and diarrhea.  Genitourinary: Negative for frequency and difficulty urinating.  Musculoskeletal: Positive for arthritis. Negative for neck pain.  Skin: Negative for rash.  Allergic/Immunologic: Negative for environmental allergies and food allergies.  Neurological: Negative for focal weakness, weakness and headaches.  Psychiatric/Behavioral: Negative for behavioral problems and agitation.       Objective:   Physical Exam  Constitutional: She is oriented to person, place, and time. She appears well-developed and well-nourished.  HENT:  Head: Normocephalic.  Neck: Normal range of motion. No thyromegaly present.  Cardiovascular: Normal rate, regular rhythm, normal heart sounds and intact distal pulses.   No murmur heard. Pulmonary/Chest: Effort normal and breath  sounds normal. No respiratory distress. She has no wheezes.  Abdominal: Soft. Bowel sounds are normal. She exhibits no distension  and no mass. There is no tenderness.  Musculoskeletal: Normal range of motion. She exhibits no edema and no tenderness.  Lymphadenopathy:    She has no cervical adenopathy.  Neurological: She is alert and oriented to person, place, and time. She exhibits normal muscle tone.  Skin: Skin is warm and dry.  Psychiatric: She has a normal mood and affect. Her behavior is normal.   It should be noted on physical examination there was a murmur. It was a harsh murmur 3/6 holosystolic. She had a previous echo done in 2006 which showed mild mitral regurgitation       Assessment & Plan:  Colonoscopy sheet-patient was encouraged to get this done by fall of this year DM-her A1c looks good she is continue current measures I encourage her to increase activity also watching diet. She is also get yearly eye exam. HTN-under good control Will check lab work including urine micro-protein continue ACE inhibitors watch diet minimize salt increase activity Hyperlip-she is to continue to watch her diet stay physically active and do her lab work Hypothyr-she is instructed to continue her medication we'll check lab work Heart murmur- Echo, this will be done followup on results Gout-stable but check uric acid level  Followup in 6 months

## 2013-05-16 ENCOUNTER — Ambulatory Visit (HOSPITAL_COMMUNITY)
Admission: RE | Admit: 2013-05-16 | Discharge: 2013-05-16 | Disposition: A | Discharge status: Home / Self Care | Payer: BC Managed Care – PPO | Source: Ambulatory Visit | Attending: Family Medicine | Admitting: Family Medicine

## 2013-05-16 ENCOUNTER — Encounter: Payer: Self-pay | Admitting: Family Medicine

## 2013-05-16 DIAGNOSIS — I059 Rheumatic mitral valve disease, unspecified: Secondary | ICD-10-CM | POA: Insufficient documentation

## 2013-05-16 DIAGNOSIS — I359 Nonrheumatic aortic valve disorder, unspecified: Secondary | ICD-10-CM

## 2013-05-16 DIAGNOSIS — I340 Nonrheumatic mitral (valve) insufficiency: Secondary | ICD-10-CM

## 2013-05-16 DIAGNOSIS — I519 Heart disease, unspecified: Secondary | ICD-10-CM | POA: Insufficient documentation

## 2013-05-16 DIAGNOSIS — E119 Type 2 diabetes mellitus without complications: Secondary | ICD-10-CM | POA: Insufficient documentation

## 2013-05-16 DIAGNOSIS — E785 Hyperlipidemia, unspecified: Secondary | ICD-10-CM | POA: Insufficient documentation

## 2013-05-16 DIAGNOSIS — R011 Cardiac murmur, unspecified: Secondary | ICD-10-CM | POA: Insufficient documentation

## 2013-05-16 DIAGNOSIS — I10 Essential (primary) hypertension: Secondary | ICD-10-CM | POA: Insufficient documentation

## 2013-05-16 DIAGNOSIS — Z87891 Personal history of nicotine dependence: Secondary | ICD-10-CM | POA: Insufficient documentation

## 2013-05-16 DIAGNOSIS — E669 Obesity, unspecified: Secondary | ICD-10-CM | POA: Insufficient documentation

## 2013-05-16 DIAGNOSIS — Z6841 Body Mass Index (BMI) 40.0 and over, adult: Secondary | ICD-10-CM | POA: Insufficient documentation

## 2013-05-16 DIAGNOSIS — I350 Nonrheumatic aortic (valve) stenosis: Secondary | ICD-10-CM | POA: Insufficient documentation

## 2013-05-16 NOTE — Progress Notes (Signed)
Dr. Nicki Reaper spoke with pt about results.

## 2013-05-16 NOTE — Progress Notes (Signed)
*  PRELIMINARY RESULTS* Echocardiogram 2D Echocardiogram has been performed.  [place], Parrott 05/16/2013, 11:28 AM

## 2013-05-18 ENCOUNTER — Other Ambulatory Visit: Payer: Self-pay | Admitting: Family Medicine

## 2013-05-18 DIAGNOSIS — I350 Nonrheumatic aortic (valve) stenosis: Secondary | ICD-10-CM

## 2013-05-19 ENCOUNTER — Encounter: Payer: Self-pay | Admitting: Family Medicine

## 2013-06-05 ENCOUNTER — Ambulatory Visit: Payer: BC Managed Care – PPO | Admitting: Cardiology

## 2013-06-05 ENCOUNTER — Encounter: Payer: Self-pay | Admitting: Cardiology

## 2013-06-05 ENCOUNTER — Ambulatory Visit (INDEPENDENT_AMBULATORY_CARE_PROVIDER_SITE_OTHER): Payer: BC Managed Care – PPO | Admitting: Cardiology

## 2013-06-05 VITALS — BP 119/63 | HR 75 | Ht 69.0 in | Wt 279.0 lb

## 2013-06-05 DIAGNOSIS — I350 Nonrheumatic aortic (valve) stenosis: Secondary | ICD-10-CM

## 2013-06-05 DIAGNOSIS — I359 Nonrheumatic aortic valve disorder, unspecified: Secondary | ICD-10-CM

## 2013-06-05 DIAGNOSIS — I519 Heart disease, unspecified: Secondary | ICD-10-CM

## 2013-06-05 DIAGNOSIS — I10 Essential (primary) hypertension: Secondary | ICD-10-CM

## 2013-06-05 NOTE — Patient Instructions (Signed)
Your physician wants you to follow-up in: 1 year You will receive a reminder letter in the mail two months in advance. If you don't receive a letter, please call our office to schedule the follow-up appointment.    Your physician recommends that you continue on your current medications as directed. Please refer to the Current Medication list given to you today.     Thank you for choosing [facility] !  

## 2013-06-05 NOTE — Assessment & Plan Note (Signed)
Overall mild, appears to be a trileaflet valve, mean gradient 17 mm mercury. She is asymptomatic at this point. Murmur on examination consistent with this diagnosis. For now continue basic risk factor modification strategies. We will see her back in one year for clinical exam.

## 2013-06-05 NOTE — Assessment & Plan Note (Signed)
Mild, grade 1, would be consistent with hypertension and aortic stenosis. Continue medical treatment and basic risk factor modification.

## 2013-06-05 NOTE — Assessment & Plan Note (Signed)
Blood pressure normal today. Keep followup with Dr. Wolfgang Phoenix.

## 2013-06-05 NOTE — Progress Notes (Signed)
Clinical Summary Ms. Mcquown is a 63 y.o.female referred for cardiology consultation by Dr. Wolfgang Phoenix. She is here with her husband. She reports a history of heart murmur for a least the last 20-25 years. Recently she was referred for echocardiogram for further assessment.  Echocardiogram from February of this year reported severe LVH with LVEF 18-29%, grade 1 diastolic dysfunction, mild aortic stenosis with mean gradient 17 mm of mercury, MAC, mild left atrial enlargement. I reviewed the images myself, LVH is in the mild to moderate range, aortic valve is trileaflet with mild to moderate calcification and mild stenosis. There is moderate MAC. ECG from September 2014 showed sinus rhythm with poor R-wave progression.  Today we discussed the results of her echocardiogram, at this point it is unlikely that her aortic stenosis is of any major clinical significance. We did discuss serial examinations and eventually plan for a followup echocardiogram down the road. She reports NYHA class II dyspnea at baseline, no exertional chest pain, no palpitations or syncope.   No Known Allergies  Current Outpatient Prescriptions  Medication Sig Dispense Refill  . allopurinol (ZYLOPRIM) 100 MG tablet Take 100 mg by mouth daily.      Marland Kitchen aspirin 81 MG tablet Take 81 mg by mouth daily.      . citalopram (CELEXA) 20 MG tablet Take 20 mg by mouth daily.      . furosemide (LASIX) 40 MG tablet Take 40 mg by mouth daily.      Marland Kitchen glipiZIDE (GLUCOTROL) 10 MG tablet Take 10 mg by mouth 2 (two) times daily with a meal.      . HYDROcodone-acetaminophen (NORCO/VICODIN) 5-325 MG per tablet TAKE 1 TABLET BY MOUTH EVERY 4 TO 6 HOURS AS NEEDED FOR PAIN  30 tablet  0  . indomethacin (INDOCIN) 50 MG capsule Take 50 mg by mouth as needed.      Marland Kitchen levothyroxine (SYNTHROID, LEVOTHROID) 112 MCG tablet TAKE 1 TABLET BY MOUTH EVERY DAY  90 tablet  5  . lisinopril (PRINIVIL,ZESTRIL) 20 MG tablet Take 20 mg by mouth daily.      . metFORMIN  (GLUCOPHAGE) 500 MG tablet Take 500 mg by mouth 2 (two) times daily.      . nabumetone (RELAFEN) 500 MG tablet TAKE 1 TABLET BY MOUTH TWICE A DAY DO NOT TAKE WITH INDOMETHACIN  60 tablet  3  . omeprazole (PRILOSEC) 20 MG capsule Take 20 mg by mouth daily.      . pravastatin (PRAVACHOL) 40 MG tablet Take 40 mg by mouth daily.      Marland Kitchen zolpidem (AMBIEN) 10 MG tablet TAKE 1 TABLET AT BEDTIME  30 tablet  2   No current facility-administered medications for this visit.    Past Medical History  Diagnosis Date  . Hypertension   . Type 2 diabetes mellitus   . Hypothyroidism   . Arthritis   . GERD (gastroesophageal reflux disease)   . Sleep apnea     Moderate - noncompliant with CPAP  . Gout   . Wears glasses   . Wears partial dentures     Upper  . Aortic stenosis     Past Surgical History  Procedure Laterality Date  . Colonoscopy    . Cholecystectomy    . Back surgery      Lumbar  . Carpal tunnel release  11/02/2011    Procedure: CARPAL TUNNEL RELEASE;  Surgeon: Tennis Must, MD;  Location: [facility];  Service: Orthopedics;  Laterality: Right;  History reviewed. No pertinent family history.  Social History Ms. Rydberg reports that she quit smoking about 31 years ago. Her smoking use included Cigarettes. She smoked 0.00 packs per day. She does not have any smokeless tobacco history on file. Ms. Gusman reports that she drinks alcohol.  Review of Systems No orthopnea or PND, no palpitations or syncope. Stable appetite. Otherwise negative.  Physical Examination Filed Vitals:   06/05/13 1304  BP: 119/63  Pulse: 75   Filed Weights   06/05/13 1304  Weight: 279 lb (126.554 kg)   Obese woman, appears comfortable at rest. HEENT: Conjunctiva and lids normal, oropharynx clear. Neck: Supple, no elevated JVP or carotid bruits, no thyromegaly. Lungs: Clear to auscultation, nonlabored breathing at rest. Cardiac: Regular rate and rhythm, no S3, 3-7/8 systolic murmur at  the base, no pericardial rub. Abdomen: Soft, nontender, bowel sounds present, no guarding or rebound. Extremities: No pitting edema, distal pulses 2+. Skin: Warm and dry. Musculoskeletal: No kyphosis. Neuropsychiatric: Alert and oriented x3, affect grossly appropriate.   Problem List and Plan   Calcific aortic stenosis Overall mild, appears to be a trileaflet valve, mean gradient 17 mm mercury. She is asymptomatic at this point. Murmur on examination consistent with this diagnosis. For now continue basic risk factor modification strategies. We will see her back in one year for clinical exam.  Hypertension Blood pressure normal today. Keep followup with Dr. Wolfgang Phoenix.  Diastolic dysfunction, left ventricle Mild, grade 1, would be consistent with hypertension and aortic stenosis. Continue medical treatment and basic risk factor modification.    Satira Sark, M.D., F.A.C.C.

## 2013-06-25 ENCOUNTER — Telehealth: Payer: Self-pay | Admitting: Family Medicine

## 2013-06-25 NOTE — Telephone Encounter (Signed)
Patient had labs done at [facility] .please review labs.

## 2013-06-26 NOTE — Telephone Encounter (Signed)
Notified patient labs overall good, slight increase in 1 liver enzyme, continue all meds recheck A1C and Lipid/ liver in 3 months with follow up ov. Patient transferred to front desk to schedule appointment.

## 2013-06-26 NOTE — Telephone Encounter (Signed)
Labs overall good, slight increase in 1 liver enzyme, continue all meds recheck A1C and Lipid/ liver in 3 months with follow up ov

## 2013-07-04 ENCOUNTER — Other Ambulatory Visit (HOSPITAL_COMMUNITY): Payer: Self-pay | Admitting: Family Medicine

## 2013-07-14 ENCOUNTER — Encounter: Payer: Self-pay | Admitting: Family Medicine

## 2013-07-18 ENCOUNTER — Telehealth: Payer: Self-pay | Admitting: *Deleted

## 2013-07-18 MED ORDER — ZOLPIDEM TARTRATE 10 MG PO TABS: ORAL_TABLET | ORAL | Status: DC

## 2013-07-18 NOTE — Telephone Encounter (Signed)
Ok plus three ref 

## 2013-07-18 NOTE — Telephone Encounter (Signed)
Last seen 05/12/13. Requesting refill on ambien. [facility] [place]

## 2013-07-30 ENCOUNTER — Other Ambulatory Visit: Payer: Self-pay | Admitting: Nurse Practitioner

## 2013-07-30 ENCOUNTER — Telehealth: Payer: Self-pay | Admitting: *Deleted

## 2013-07-30 MED ORDER — HYDROCODONE-ACETAMINOPHEN 5-325 MG PO TABS: ORAL_TABLET | ORAL | Status: DC

## 2013-07-30 NOTE — Telephone Encounter (Signed)
Needs refill on hydrocodone. Last seen 06/04/13. Please give script to autumn if you approve refill.

## 2013-08-01 ENCOUNTER — Other Ambulatory Visit: Payer: Self-pay | Admitting: Family Medicine

## 2013-09-02 ENCOUNTER — Telehealth: Payer: Self-pay | Admitting: *Deleted

## 2013-09-02 ENCOUNTER — Other Ambulatory Visit: Payer: Self-pay | Admitting: *Deleted

## 2013-09-02 DIAGNOSIS — Z79899 Other long term (current) drug therapy: Secondary | ICD-10-CM

## 2013-09-02 DIAGNOSIS — M109 Gout, unspecified: Secondary | ICD-10-CM

## 2013-09-02 DIAGNOSIS — E119 Type 2 diabetes mellitus without complications: Secondary | ICD-10-CM

## 2013-09-02 MED ORDER — INDOMETHACIN 50 MG PO CAPS: 50.0000 mg | ORAL_CAPSULE | Freq: Two times a day (BID) | ORAL | Status: DC | PRN

## 2013-09-02 NOTE — Telephone Encounter (Signed)
Does pt need bloodwork.  06/13/13 liver, lipid, bmp 02/23 lipid, liver, bmp, tsh, microalb urine, a1c, uric acid

## 2013-09-02 NOTE — Telephone Encounter (Signed)
Patient needs uric acid level, metabolic 7, hemoglobin G9M, liver profile. Cholesterol does not need to be repeated currently. It would be best that the patient does her blood work before office visit if possible.

## 2013-09-03 NOTE — Addendum Note (Signed)
Addended by: Carmelina Noun on: 09/03/2013 09:21 AM   Modules accepted: Orders

## 2013-09-03 NOTE — Telephone Encounter (Signed)
bloodwork orders ready. Pt notified.  

## 2013-09-23 LAB — HEPATIC FUNCTION PANEL
ALT: 25 U/L (ref 0–35)
AST: 39 U/L — AB (ref 0–37)
Albumin: 4.3 g/dL (ref 3.5–5.2)
Alkaline Phosphatase: 58 U/L (ref 39–117)
BILIRUBIN INDIRECT: 0.4 mg/dL (ref 0.2–1.2)
BILIRUBIN TOTAL: 0.5 mg/dL (ref 0.2–1.2)
Bilirubin, Direct: 0.1 mg/dL (ref 0.0–0.3)
Total Protein: 7 g/dL (ref 6.0–8.3)

## 2013-09-23 LAB — BASIC METABOLIC PANEL
BUN: 27 mg/dL — AB (ref 6–23)
CALCIUM: 9.3 mg/dL (ref 8.4–10.5)
CO2: 28 mEq/L (ref 19–32)
Chloride: 101 mEq/L (ref 96–112)
Creat: 1.47 mg/dL — ABNORMAL HIGH (ref 0.50–1.10)
Glucose, Bld: 109 mg/dL — ABNORMAL HIGH (ref 70–99)
POTASSIUM: 5.3 meq/L (ref 3.5–5.3)
SODIUM: 139 meq/L (ref 135–145)

## 2013-09-23 LAB — URIC ACID: URIC ACID, SERUM: 8.8 mg/dL — AB (ref 2.4–7.0)

## 2013-09-23 LAB — HEMOGLOBIN A1C
Hgb A1c MFr Bld: 7.1 % — ABNORMAL HIGH (ref <5.7)
Mean Plasma Glucose: 157 mg/dL — ABNORMAL HIGH (ref <117)

## 2013-09-24 ENCOUNTER — Ambulatory Visit: Payer: BC Managed Care – PPO | Admitting: Family Medicine

## 2013-10-03 ENCOUNTER — Ambulatory Visit (INDEPENDENT_AMBULATORY_CARE_PROVIDER_SITE_OTHER): Payer: BC Managed Care – PPO | Admitting: Family Medicine

## 2013-10-03 ENCOUNTER — Encounter: Payer: Self-pay | Admitting: Family Medicine

## 2013-10-03 VITALS — BP 134/76 | Ht 68.0 in | Wt 274.0 lb

## 2013-10-03 DIAGNOSIS — M109 Gout, unspecified: Secondary | ICD-10-CM

## 2013-10-03 DIAGNOSIS — E785 Hyperlipidemia, unspecified: Secondary | ICD-10-CM

## 2013-10-03 DIAGNOSIS — M542 Cervicalgia: Secondary | ICD-10-CM

## 2013-10-03 DIAGNOSIS — I10 Essential (primary) hypertension: Secondary | ICD-10-CM

## 2013-10-03 DIAGNOSIS — IMO0001 Reserved for inherently not codable concepts without codable children: Secondary | ICD-10-CM

## 2013-10-03 DIAGNOSIS — E038 Other specified hypothyroidism: Secondary | ICD-10-CM

## 2013-10-03 DIAGNOSIS — E1165 Type 2 diabetes mellitus with hyperglycemia: Secondary | ICD-10-CM

## 2013-10-03 MED ORDER — ALLOPURINOL 300 MG PO TABS: 300.0000 mg | ORAL_TABLET | Freq: Every day | ORAL | Status: DC

## 2013-10-03 NOTE — Progress Notes (Signed)
   Subjective:    Patient ID: Sabrina Deleon, female    DOB: 1951-02-16, 63 y.o.   MRN: [id number]  Diabetes She presents for her follow-up diabetic visit. She has type 2 diabetes mellitus. Pertinent negatives for hypoglycemia include no confusion. Pertinent negatives for diabetes include no chest pain, no fatigue, no polydipsia, no polyphagia and no weakness. Current diabetic treatment includes oral agent (dual therapy). She is following a diabetic diet. She rarely participates in exercise. She does not see a podiatrist.Eye exam is current.   Long discussion held with patient regarding dietary measures her lab results as well as the importance of exercise and weight reduction.   Review of Systems  Constitutional: Negative for activity change, appetite change and fatigue.  HENT: Negative for congestion.   Respiratory: Negative for shortness of breath and wheezing.   Cardiovascular: Positive for leg swelling. Negative for chest pain.  Gastrointestinal: Negative for abdominal pain.  Endocrine: Negative for polydipsia and polyphagia.  Genitourinary: Negative for frequency and difficulty urinating.  Musculoskeletal: Positive for arthralgias, back pain and neck pain. Negative for joint swelling and myalgias.  Neurological: Negative for weakness.  Psychiatric/Behavioral: Negative for confusion.       Objective:   Physical Exam  Vitals reviewed. Constitutional: She appears well-nourished. No distress.  Cardiovascular: Normal rate, regular rhythm and normal heart sounds.   No murmur heard. Pulmonary/Chest: Effort normal and breath sounds normal. No respiratory distress.  Musculoskeletal: She exhibits no edema.  Lymphadenopathy:    She has no cervical adenopathy.  Neurological: She is alert. She exhibits normal muscle tone.  Psychiatric: Her behavior is normal.          Assessment & Plan:  Legs restless-certainly if this continues we need to add medication today we've chosen not to  add any additional medicine   Diabetes-her diabetes overall decent control. She is to continue medication watch diet. Try to get A1c below 7 if possible.   Renal insuff-stop anti-inflammatory. Recheck metabolic 7 and 8 weeks. Tylenol for pain may end up having he uses hydrocodone.  Gout-she needs to check her uric acid level. May need to be on a different medicine increase allopurinol 300 mg a day she will check this lab work in 8 weeks'   Neuropathy-I truly doubt she has neuropathy her diabetic foot exam is good.  Post cervical pain-patient relates she gets pain in the back of her head when she seems fine no because of the effort to take. On examination just tenderness if this persists may need MRI but for now plain x-ray would be fine

## 2013-10-04 LAB — MICROALBUMIN, URINE: Microalb, Ur: 0.5 mg/dL (ref 0.00–1.89)

## 2013-10-17 ENCOUNTER — Other Ambulatory Visit (HOSPITAL_COMMUNITY): Payer: Self-pay | Admitting: Family Medicine

## 2013-10-17 ENCOUNTER — Telehealth: Payer: Self-pay | Admitting: *Deleted

## 2013-10-17 MED ORDER — HYDROCODONE-ACETAMINOPHEN 5-325 MG PO TABS: ORAL_TABLET | ORAL | Status: DC

## 2013-10-17 NOTE — Telephone Encounter (Signed)
Ok times one 

## 2013-10-17 NOTE — Telephone Encounter (Signed)
Requesting refill on vicodan. Seen this month for check up. Did not get script at visit.

## 2013-10-17 NOTE — Telephone Encounter (Signed)
Script ready for pickup. Pt notiifed.

## 2013-11-05 ENCOUNTER — Other Ambulatory Visit (HOSPITAL_COMMUNITY): Payer: Self-pay | Admitting: Family Medicine

## 2013-11-05 ENCOUNTER — Telehealth: Payer: Self-pay

## 2013-11-05 NOTE — Telephone Encounter (Signed)
Patient states that ever since her Relafen was stopped, she has been experiencing pain from head to toe all the time. States when she was taking the Relafen, she never had problems with pain. What else do you recommend at this point to help her pain?

## 2013-11-05 NOTE — Telephone Encounter (Signed)
Patient said she will stick with Tylenol for now and will call back if she changes her mind.

## 2013-11-05 NOTE — Telephone Encounter (Signed)
Reason Relafen was stopped was because her kidney functions are abnormal. #1 please change her med list in the record to reflect that this medicine was stopped. #2 she doesn't have many choices plane Tylenol 500 mg 2 by mouth q. 8 hours can be tried, other options tramadol 3 times a day or hydrocodone 3 times a day. Both of those can cause potential habitual use. Anti-inflammatories are not wise because of her kidney functions. If we use controlled medicines I will issue a prescription and follow her up in a few weeks

## 2013-11-21 ENCOUNTER — Telehealth: Payer: Self-pay | Admitting: Family Medicine

## 2013-11-21 ENCOUNTER — Other Ambulatory Visit: Payer: Self-pay | Admitting: Nurse Practitioner

## 2013-11-21 MED ORDER — HYDROCODONE-ACETAMINOPHEN 5-325 MG PO TABS: ORAL_TABLET | ORAL | Status: DC

## 2013-11-21 NOTE — Telephone Encounter (Signed)
Patient needs refill hydrocodone 5/325

## 2014-01-01 ENCOUNTER — Other Ambulatory Visit: Payer: Self-pay | Admitting: *Deleted

## 2014-01-01 ENCOUNTER — Telehealth: Payer: Self-pay | Admitting: Nurse Practitioner

## 2014-01-01 MED ORDER — PREDNISONE 20 MG PO TABS: ORAL_TABLET | ORAL | Status: DC

## 2014-01-01 NOTE — Telephone Encounter (Signed)
Prednisone can help but it will make her sugars go up significantly while she is on it. Another option is colchicine 0.6 mg twice daily until relief. That would not cause her sugar to go up. Please discuss it with her see what she would like to do.

## 2014-01-01 NOTE — Telephone Encounter (Signed)
Patient had a flare up of her gout in her left foot. Wants to know if we can call in prednisone.    [facility]

## 2014-01-01 NOTE — Telephone Encounter (Signed)
Pred, 20 mg, #18, 3qd for 3d then 2qd for 3d then 1qd for 3d

## 2014-01-01 NOTE — Telephone Encounter (Signed)
Discussed with pt. She would like prednisone.

## 2014-01-01 NOTE — Telephone Encounter (Signed)
Med sent to pharm. Pt notified.  

## 2014-01-09 ENCOUNTER — Other Ambulatory Visit: Payer: Self-pay | Admitting: Nurse Practitioner

## 2014-01-09 ENCOUNTER — Ambulatory Visit: Payer: BC Managed Care – PPO | Admitting: Family Medicine

## 2014-01-09 ENCOUNTER — Telehealth: Payer: Self-pay | Admitting: *Deleted

## 2014-01-09 ENCOUNTER — Ambulatory Visit: Payer: BC Managed Care – PPO | Admitting: Nurse Practitioner

## 2014-01-09 MED ORDER — PREDNISONE 20 MG PO TABS: ORAL_TABLET | ORAL | Status: DC

## 2014-01-09 NOTE — Telephone Encounter (Signed)
Spoke with husband; repeat prednisone but office visit if persists. Continue Allopurinol for now

## 2014-01-09 NOTE — Telephone Encounter (Signed)
Last Friday, pt was put on Prednisone for gout. Last pill was yesterday and pain is back. She can hardly walk on it.  What do you advise?

## 2014-01-10 ENCOUNTER — Other Ambulatory Visit (HOSPITAL_COMMUNITY): Payer: Self-pay | Admitting: Family Medicine

## 2014-01-11 ENCOUNTER — Other Ambulatory Visit: Payer: Self-pay | Admitting: Family Medicine

## 2014-01-12 ENCOUNTER — Other Ambulatory Visit: Payer: Self-pay | Admitting: *Deleted

## 2014-01-12 MED ORDER — GLIPIZIDE 10 MG PO TABS: ORAL_TABLET | ORAL | Status: DC

## 2014-01-12 MED ORDER — METFORMIN HCL 500 MG PO TABS: ORAL_TABLET | ORAL | Status: DC

## 2014-01-14 ENCOUNTER — Other Ambulatory Visit (HOSPITAL_COMMUNITY): Payer: Self-pay | Admitting: Family Medicine

## 2014-01-15 ENCOUNTER — Telehealth: Payer: Self-pay

## 2014-01-15 ENCOUNTER — Other Ambulatory Visit: Payer: Self-pay | Admitting: Nurse Practitioner

## 2014-01-15 MED ORDER — HYDROCODONE-ACETAMINOPHEN 5-325 MG PO TABS: ORAL_TABLET | ORAL | Status: DC

## 2014-01-15 NOTE — Telephone Encounter (Signed)
Patient needs a refill on her hydrocodone 5/325 mg 1 tab q 4-6 hours prn. Patient has an appointment next week with Hoyle Sauer.

## 2014-01-16 ENCOUNTER — Ambulatory Visit (INDEPENDENT_AMBULATORY_CARE_PROVIDER_SITE_OTHER): Payer: BC Managed Care – PPO | Admitting: Nurse Practitioner

## 2014-01-16 ENCOUNTER — Ambulatory Visit (HOSPITAL_COMMUNITY)
Admission: RE | Admit: 2014-01-16 | Discharge: 2014-01-16 | Disposition: A | Discharge status: Home / Self Care | Payer: BC Managed Care – PPO | Source: Ambulatory Visit | Attending: Nurse Practitioner | Admitting: Nurse Practitioner

## 2014-01-16 ENCOUNTER — Encounter: Payer: Self-pay | Admitting: Nurse Practitioner

## 2014-01-16 VITALS — BP 122/80 | Ht 68.0 in | Wt 262.0 lb

## 2014-01-16 DIAGNOSIS — M25572 Pain in left ankle and joints of left foot: Secondary | ICD-10-CM

## 2014-01-16 DIAGNOSIS — M1000 Idiopathic gout, unspecified site: Secondary | ICD-10-CM

## 2014-01-16 DIAGNOSIS — M7989 Other specified soft tissue disorders: Secondary | ICD-10-CM | POA: Diagnosis not present

## 2014-01-16 DIAGNOSIS — M109 Gout, unspecified: Secondary | ICD-10-CM | POA: Insufficient documentation

## 2014-01-16 DIAGNOSIS — M79672 Pain in left foot: Secondary | ICD-10-CM

## 2014-01-16 MED ORDER — COLCHICINE 0.6 MG PO TABS: ORAL_TABLET | ORAL | Status: DC

## 2014-01-19 ENCOUNTER — Other Ambulatory Visit: Payer: Self-pay | Admitting: Family Medicine

## 2014-01-20 ENCOUNTER — Telehealth: Payer: Self-pay

## 2014-01-20 DIAGNOSIS — M79672 Pain in left foot: Secondary | ICD-10-CM

## 2014-01-20 LAB — BASIC METABOLIC PANEL
BUN: 27 mg/dL — ABNORMAL HIGH (ref 6–23)
CHLORIDE: 94 meq/L — AB (ref 96–112)
CO2: 27 mEq/L (ref 19–32)
Calcium: 9.6 mg/dL (ref 8.4–10.5)
Creat: 1.38 mg/dL — ABNORMAL HIGH (ref 0.50–1.10)
GLUCOSE: 182 mg/dL — AB (ref 70–99)
POTASSIUM: 5.3 meq/L (ref 3.5–5.3)
Sodium: 137 mEq/L (ref 135–145)

## 2014-01-20 LAB — MICROALBUMIN, URINE: MICROALB UR: 1.7 mg/dL (ref <2.0)

## 2014-01-20 LAB — URIC ACID: Uric Acid, Serum: 6.4 mg/dL (ref 2.4–7.0)

## 2014-01-20 NOTE — Telephone Encounter (Signed)
Patient is having severe foot pain due to the erosion of her bone due to gout. Unable to walk and pain is constant. Current treatments are not working. Please advise.

## 2014-01-20 NOTE — Telephone Encounter (Signed)
The patient has ongoing pain from gout and bone erosion. I would recommend Colcyrs serial 0.6 mg should be done twice a day ongoing until pain relief, if she once to do this you can call in 60 pills. As for her uric acid level it is reasonable but she would have less frequency of occurrence if the uric acid level was lower than 5.5. A low purine diet is very important. In addition to this we could switch her from allopurinol to Uloric which typically can do better at lowering her uric acid level.the x-ray shows bony erosion which means she will always have arthritis in that joint. Certainly it will hurt worse with a flareup of gout. If she is interested in the switch let us now. I was informed that this patient is seen podiatry today which is perfectly fine. I would recommend that you discussed with the patient these recommendations then she can choose what she would like to do.

## 2014-01-20 NOTE — Telephone Encounter (Signed)
Patient was notified and she will call back if she decides to do these changes after seeing the podiatrist today.

## 2014-01-21 ENCOUNTER — Encounter: Payer: Self-pay | Admitting: Nurse Practitioner

## 2014-01-21 NOTE — Progress Notes (Signed)
Subjective:  Presents complaints of pain in the left ankle/foot area increasingly severe over the past several days. To can nine-day taper of prednisone about 3 weeks ago for gout, symptoms much improved. On 01/01/2014 began having symptoms again. Note her allopurinol dose was increased and July due to gout flareup. Having severe pain in the same place as her previous gout exacerbation. No fever. No other joint involvement. No history of injury. Currently completing a second prednisone taper started on 10/23. Has been drinking cherry juice and eating cherries to see if this will help her symptoms.  Objective:   BP 122/80 mmHg  Ht 5\' 8"  (1.727 m)  Wt 262 lb (118.842 kg)  BMI 39.85 kg/m2 NAD. Alert, oriented. Lungs clear. Heart regular rate rhythm. Extreme tenderness to palpation noted along the lateral edge of the left foot into the small toe area.  See x-ray reports. X-ray of the foot shows suspected gout arthropathy with some erosion in the metatarsals. Ankle x-ray unremarkable.  Assessment:  Problem List Items Addressed This Visit      Other   Gout    Other Visit Diagnoses    Left foot pain    -  Primary    Relevant Orders       DG Foot Complete Left (Completed)    Left ankle pain        Relevant Orders       DG Ankle Complete Left (Completed)      Plan:  Meds ordered this encounter  Medications  . colchicine 0.6 MG tablet    Sig: 2 po first dose then one po 1 hr later    Dispense:  3 tablet    Refill:  0    Order Specific Question:  Supervising Provider    Answer:  Maggie Font   Encourage patient to get uric acid level as previously ordered by Dr. Nicki Reaper. Take colchicine as directed. Further follow-up based on lab results.

## 2014-01-23 ENCOUNTER — Other Ambulatory Visit: Payer: Self-pay | Admitting: *Deleted

## 2014-01-23 ENCOUNTER — Ambulatory Visit: Payer: BC Managed Care – PPO | Admitting: Nurse Practitioner

## 2014-01-23 MED ORDER — ZOLPIDEM TARTRATE 10 MG PO TABS: ORAL_TABLET | ORAL | Status: DC

## 2014-01-23 MED ORDER — FEBUXOSTAT 40 MG PO TABS: 40.0000 mg | ORAL_TABLET | Freq: Every day | ORAL | Status: DC

## 2014-01-23 NOTE — Telephone Encounter (Signed)
Ok 6 mo worth 

## 2014-01-23 NOTE — Telephone Encounter (Signed)
Last seen 01/16/14 for gout Last med check 10/03/13

## 2014-01-28 ENCOUNTER — Other Ambulatory Visit: Payer: Self-pay | Admitting: Family Medicine

## 2014-01-28 ENCOUNTER — Other Ambulatory Visit: Payer: Self-pay | Admitting: *Deleted

## 2014-01-28 MED ORDER — PRAVASTATIN SODIUM 40 MG PO TABS: ORAL_TABLET | ORAL | Status: DC

## 2014-02-15 ENCOUNTER — Other Ambulatory Visit: Payer: Self-pay | Admitting: Family Medicine

## 2014-03-01 ENCOUNTER — Other Ambulatory Visit: Payer: Self-pay | Admitting: Family Medicine

## 2014-03-02 NOTE — Telephone Encounter (Signed)
Patient states that this medication is $180 a month and she can't afford this. Patient wants to know can something cheaper be prescribed?

## 2014-03-16 ENCOUNTER — Encounter: Payer: Self-pay | Admitting: Family Medicine

## 2014-03-16 ENCOUNTER — Ambulatory Visit (INDEPENDENT_AMBULATORY_CARE_PROVIDER_SITE_OTHER): Payer: BC Managed Care – PPO | Admitting: Nurse Practitioner

## 2014-03-16 ENCOUNTER — Encounter: Payer: Self-pay | Admitting: Nurse Practitioner

## 2014-03-16 VITALS — BP 132/82 | Temp 98.7°F | Ht 68.0 in | Wt 267.1 lb

## 2014-03-16 DIAGNOSIS — J209 Acute bronchitis, unspecified: Secondary | ICD-10-CM

## 2014-03-16 DIAGNOSIS — J329 Chronic sinusitis, unspecified: Secondary | ICD-10-CM

## 2014-03-16 DIAGNOSIS — H6502 Acute serous otitis media, left ear: Secondary | ICD-10-CM

## 2014-03-16 MED ORDER — AMOXICILLIN-POT CLAVULANATE 875-125 MG PO TABS: 1.0000 | ORAL_TABLET | Freq: Two times a day (BID) | ORAL | Status: DC

## 2014-03-16 NOTE — Progress Notes (Signed)
Subjective:  Presents for complaints of cough for more than 2 weeks. Worse at night. Runny nose. Now producing yellow mucus. Minimal wheezing at times. According to patient does not need inhaler. No fever. No headache. No ear pain. Sore throat. Acid reflux stable. No abdominal pain. Hoarseness. No difficulty swallowing.  Objective:   BP 132/82 mmHg  Temp(Src) 98.7 F (37.1 C) (Oral)  Ht 5\' 8"  (1.727 m)  Wt 267 lb 2 oz (121.167 kg)  BMI 40.63 kg/m2 NAD. Alert, oriented. Right TM clear effusion. Left TM dull with moderate erythema. Pharynx mildly erythematous with PND noted. Neck supple with mild soft anterior adenopathy. Lungs scattered faint extra crackles, no wheezing or tachypnea. Heart regular rate rhythm.  Assessment: Rhinosinusitis  Acute bronchitis, unspecified organism  Acute serous otitis media of left ear, recurrence not specified   Plan: Meds ordered this encounter  Medications  . amoxicillin-clavulanate (AUGMENTIN) 875-125 MG per tablet    Sig: Take 1 tablet by mouth 2 (two) times daily.    Dispense:  20 tablet    Refill:  0    Order Specific Question:  Supervising Provider    Answer:  Mikey Kirschner [[id number]]   OTC meds as directed. Call back if worsens or persists.

## 2014-03-19 ENCOUNTER — Telehealth: Payer: Self-pay

## 2014-03-19 NOTE — Telephone Encounter (Signed)
Pt notified and verbalized understanding.

## 2014-03-19 NOTE — Telephone Encounter (Signed)
Patient on Amoxil since Monday. Throat still very sore, hurts worse at night, very hoarse. Please advise.

## 2014-03-19 NOTE — Telephone Encounter (Signed)
On Augmentin which is a very strong antibiotic great for strep throat and sinus. Recommend OTC meds as directed for head congestion; this is most likely due to drainage and possibly reflux.

## 2014-03-30 ENCOUNTER — Other Ambulatory Visit: Payer: Self-pay

## 2014-03-30 ENCOUNTER — Telehealth: Payer: Self-pay

## 2014-03-30 MED ORDER — MELOXICAM 15 MG PO TABS: 15.0000 mg | ORAL_TABLET | Freq: Every day | ORAL | Status: DC

## 2014-03-30 NOTE — Telephone Encounter (Signed)
Last seen 03/16/14

## 2014-04-04 ENCOUNTER — Other Ambulatory Visit: Payer: Self-pay | Admitting: *Deleted

## 2014-04-04 MED ORDER — FEBUXOSTAT 40 MG PO TABS: 40.0000 mg | ORAL_TABLET | Freq: Every day | ORAL | Status: DC

## 2014-04-16 ENCOUNTER — Telehealth: Payer: Self-pay | Admitting: Family Medicine

## 2014-04-16 DIAGNOSIS — M109 Gout, unspecified: Secondary | ICD-10-CM

## 2014-04-16 NOTE — Telephone Encounter (Signed)
2 choices: try Uloric at lower dose or restart Allopurinol at lower dose. Let me know which she wants to do.

## 2014-04-16 NOTE — Telephone Encounter (Signed)
Patient states that she wants to go back on the allopurinol and colchicine.

## 2014-04-16 NOTE — Telephone Encounter (Signed)
Pt's ULORIC is being denied, tried to get covered last week, insurance states med is covered but due to being a step therapy drug, pt must use a lower strength first, please advise.

## 2014-04-17 MED ORDER — COLCHICINE 0.6 MG PO TABS: 0.6000 mg | ORAL_TABLET | Freq: Every day | ORAL | Status: DC

## 2014-04-17 MED ORDER — ALLOPURINOL 100 MG PO TABS: 100.0000 mg | ORAL_TABLET | Freq: Every day | ORAL | Status: DC

## 2014-04-17 NOTE — Telephone Encounter (Signed)
Start with allopurinol 100 mg qd, #30, 2 refills. And colchicine 0.6 mg daily, #30, 1 refill. Check Met 7 and uric acid in 2 to 3  weeks.

## 2014-04-17 NOTE — Telephone Encounter (Signed)
Family notified

## 2014-04-18 ENCOUNTER — Other Ambulatory Visit: Payer: Self-pay | Admitting: Family Medicine

## 2014-04-20 NOTE — Telephone Encounter (Signed)
Pt's COLCHICINE is requiring Prior Auth, please see form & denial in red folder, please advise

## 2014-04-24 ENCOUNTER — Other Ambulatory Visit: Payer: Self-pay | Admitting: *Deleted

## 2014-04-24 LAB — HM DIABETES EYE EXAM

## 2014-04-24 MED ORDER — KETOCONAZOLE 2 % EX CREA
1.0000 | TOPICAL_CREAM | Freq: Two times a day (BID) | CUTANEOUS | Status: DC | PRN
Start: 2014-04-24 — End: 2016-03-07

## 2014-04-26 NOTE — Telephone Encounter (Signed)
Completion of the letter was done on the second page of the request. Sabrina Deleon will forward to insurance company.

## 2014-04-27 NOTE — Telephone Encounter (Signed)
Faxed new PA to [facility]

## 2014-04-28 ENCOUNTER — Telehealth: Payer: Self-pay

## 2014-04-28 ENCOUNTER — Telehealth: Payer: Self-pay | Admitting: Family Medicine

## 2014-04-28 MED ORDER — HYDROCODONE-ACETAMINOPHEN 5-325 MG PO TABS: ORAL_TABLET | ORAL | Status: DC

## 2014-04-28 NOTE — Telephone Encounter (Signed)
Patient needs a refill on her hydrocodone  

## 2014-04-28 NOTE — Telephone Encounter (Signed)
Patient was notified.

## 2014-04-28 NOTE — Telephone Encounter (Signed)
Amazingly the colchicine was turned down. But apparently Mitigare is covered. Apparently this is a different brand name for the same medicine. Had we been notified by the insurance company of this then the situation could've been resolved sooner. Please send in 30 with 3 refills. The patient is to start this with the allopurinol for for the first 4 weeks. I would recommend checking metabolic 7 as well as uric acid level in approximately 3 weeks. (If following their directions results in this being denied as well, then I will pull my hair out.)

## 2014-04-28 NOTE — Telephone Encounter (Signed)
System is saying the Mitigare is the same thing as colchicine. Please advise.

## 2014-04-28 NOTE — Telephone Encounter (Signed)
Rx was DENIED, sent denial letter to Dr. Nicki Reaper, ? Suggestions

## 2014-04-28 NOTE — Telephone Encounter (Signed)
Script ready and patient was notified.

## 2014-04-28 NOTE — Telephone Encounter (Signed)
Hand write script hopefully will approve but insurance company has the last say so

## 2014-04-28 NOTE — Telephone Encounter (Signed)
May refill times one 

## 2014-04-29 ENCOUNTER — Other Ambulatory Visit: Payer: Self-pay | Admitting: Family Medicine

## 2014-04-29 NOTE — Telephone Encounter (Signed)
May refill each 1 with 5 additional refills

## 2014-04-30 NOTE — Telephone Encounter (Signed)
Per Dr. Nicki Reaper - Mitigare was hand written on Rx and given to patient to have filled at her pharmacy.  Could not add to med list due to it kept coming up as colchicine (which apparently is the same thing but Mitigare requires failure before Rx plan will cover colchicine)

## 2014-05-09 LAB — BASIC METABOLIC PANEL
BUN: 30 mg/dL — ABNORMAL HIGH (ref 6–23)
CALCIUM: 9.6 mg/dL (ref 8.4–10.5)
CHLORIDE: 103 meq/L (ref 96–112)
CO2: 28 mEq/L (ref 19–32)
Creat: 1.35 mg/dL — ABNORMAL HIGH (ref 0.50–1.10)
Glucose, Bld: 101 mg/dL — ABNORMAL HIGH (ref 70–99)
Potassium: 5 mEq/L (ref 3.5–5.3)
SODIUM: 141 meq/L (ref 135–145)

## 2014-05-09 LAB — URIC ACID: URIC ACID, SERUM: 9 mg/dL — AB (ref 2.4–7.0)

## 2014-05-15 ENCOUNTER — Ambulatory Visit (INDEPENDENT_AMBULATORY_CARE_PROVIDER_SITE_OTHER): Payer: 59 | Admitting: Nurse Practitioner

## 2014-05-15 ENCOUNTER — Encounter: Payer: Self-pay | Admitting: Nurse Practitioner

## 2014-05-15 VITALS — BP 128/80 | Ht 68.0 in | Wt 266.0 lb

## 2014-05-15 DIAGNOSIS — E119 Type 2 diabetes mellitus without complications: Secondary | ICD-10-CM | POA: Diagnosis not present

## 2014-05-15 DIAGNOSIS — M1000 Idiopathic gout, unspecified site: Secondary | ICD-10-CM | POA: Diagnosis not present

## 2014-05-15 LAB — POCT GLYCOSYLATED HEMOGLOBIN (HGB A1C): HEMOGLOBIN A1C: 6.2

## 2014-05-15 MED ORDER — ALLOPURINOL 100 MG PO TABS: 100.0000 mg | ORAL_TABLET | Freq: Every day | ORAL | Status: DC

## 2014-05-15 NOTE — Patient Instructions (Signed)
Increase Allopurinol to 2 pills per day (200 mg) Repeat lab in 7-10 days All regular labs in 3 months Mitigare is the same colchicine

## 2014-05-19 ENCOUNTER — Encounter: Payer: Self-pay | Admitting: Nurse Practitioner

## 2014-05-19 NOTE — Progress Notes (Signed)
Subjective:  Presents to discuss her recent lab work for her gout. At this point flareup's have calmed down. Could not afford Uloric. Currently on allopurinol 100 mg per day. When patient was on 300 mg or more per day, cause a flareup of her gout. Also taking mitigare daily. Questions whether she should continue this.  Objective:   BP 128/80 mmHg  Ht 5' 8"  (1.727 m)  Wt 266 lb (120.657 kg)  BMI 40.45 kg/m2 NAD. Alert, oriented. Lungs clear. Heart regular rate rhythm. Results for orders placed or performed in visit on 05/15/14  POCT glycosylated hemoglobin (Hb A1C)  Result Value Ref Range   Hemoglobin A1C 6.2    Uric acid level on 2/19 was 9.0. Creatinine and BUN elevated but stable.  Assessment: Acute idiopathic gout, unspecified site - Plan: Basic metabolic panel  Type 2 diabetes mellitus without complication - Plan: POCT glycosylated hemoglobin (Hb A1C)  Plan:  Meds ordered this encounter  Medications  . Colchicine 0.6 MG CAPS    Sig: Take by mouth.  Marland Kitchen allopurinol (ZYLOPRIM) 100 MG tablet    Sig: Take 1 tablet (100 mg total) by mouth daily.    Dispense:  60 tablet    Refill:  2    Order Specific Question:  Supervising Provider    Answer:  Mikey Kirschner [[id number]]   Increase allopurinol to 100 mg twice a day. Our goal is to get her uric acid level below 6 if possible. Since patient cannot afford other meds, will try to slowly increase allopurinol if tolerated. Repeat met 7 and 7-10 days because of increase in allopurinol dose. Plan to repeat uric acid level at next visit. Also continue Mitigare as directed, explained this is the same medicine as colchicine which is what her insurance will cover. Return in about 3 months (around 08/13/2014), or if symptoms worsen or fail to improve.

## 2014-06-09 ENCOUNTER — Other Ambulatory Visit: Payer: Self-pay

## 2014-06-09 MED ORDER — ALLOPURINOL 100 MG PO TABS: 100.0000 mg | ORAL_TABLET | Freq: Two times a day (BID) | ORAL | Status: DC

## 2014-07-14 ENCOUNTER — Encounter: Payer: Self-pay | Admitting: Family Medicine

## 2014-07-20 ENCOUNTER — Other Ambulatory Visit: Payer: Self-pay | Admitting: Family Medicine

## 2014-07-26 ENCOUNTER — Other Ambulatory Visit: Payer: Self-pay | Admitting: Family Medicine

## 2014-07-28 ENCOUNTER — Other Ambulatory Visit: Payer: Self-pay | Admitting: Family Medicine

## 2014-08-03 ENCOUNTER — Other Ambulatory Visit: Payer: Self-pay | Admitting: *Deleted

## 2014-08-03 ENCOUNTER — Telehealth: Payer: Self-pay | Admitting: Family Medicine

## 2014-08-03 MED ORDER — HYDROCODONE-ACETAMINOPHEN 5-325 MG PO TABS
ORAL_TABLET | ORAL | Status: DC
Start: 2014-08-03 — End: 2014-12-10

## 2014-08-03 NOTE — Telephone Encounter (Signed)
Script ready. Pt notified.  

## 2014-08-03 NOTE — Telephone Encounter (Signed)
May have one refill 

## 2014-08-03 NOTE — Telephone Encounter (Signed)
Patient needs refill on hydrocodone.she last seen [place].

## 2014-08-20 ENCOUNTER — Other Ambulatory Visit: Payer: Self-pay | Admitting: Family Medicine

## 2014-08-20 NOTE — Telephone Encounter (Signed)
Ok for 6 mo worth

## 2014-08-24 ENCOUNTER — Other Ambulatory Visit: Payer: Self-pay | Admitting: *Deleted

## 2014-08-24 ENCOUNTER — Telehealth: Payer: Self-pay | Admitting: *Deleted

## 2014-08-24 MED ORDER — ZOLPIDEM TARTRATE 10 MG PO TABS: ORAL_TABLET | ORAL | Status: DC

## 2014-08-24 NOTE — Telephone Encounter (Signed)
Med faxed to pharm. Pt notified.  

## 2014-08-24 NOTE — Telephone Encounter (Signed)
May have this +4 additional refills 

## 2014-08-24 NOTE — Telephone Encounter (Signed)
Refill on [employer]. Last seen 05/15/14

## 2014-09-22 ENCOUNTER — Other Ambulatory Visit: Payer: Self-pay | Admitting: *Deleted

## 2014-09-25 ENCOUNTER — Other Ambulatory Visit: Payer: Self-pay | Admitting: Nurse Practitioner

## 2014-09-25 ENCOUNTER — Telehealth: Payer: Self-pay | Admitting: *Deleted

## 2014-09-25 MED ORDER — MUPIROCIN 2 % EX OINT
1.0000 | TOPICAL_OINTMENT | Freq: Two times a day (BID) | CUTANEOUS | Status: DC
Start: 2014-09-25 — End: 2014-12-04

## 2014-09-25 NOTE — Telephone Encounter (Signed)
Requesting [employer].

## 2014-10-10 ENCOUNTER — Other Ambulatory Visit: Payer: Self-pay | Admitting: Nurse Practitioner

## 2014-10-10 ENCOUNTER — Other Ambulatory Visit: Payer: Self-pay | Admitting: Family Medicine

## 2014-10-23 ENCOUNTER — Other Ambulatory Visit: Payer: Self-pay | Admitting: Family Medicine

## 2014-10-30 ENCOUNTER — Other Ambulatory Visit: Payer: Self-pay | Admitting: Family Medicine

## 2014-11-03 ENCOUNTER — Other Ambulatory Visit: Payer: Self-pay | Admitting: Family Medicine

## 2014-11-03 MED ORDER — ALLOPURINOL 100 MG PO TABS: ORAL_TABLET | ORAL | Status: DC

## 2014-11-03 NOTE — Telephone Encounter (Signed)
allopurinol (ZYLOPRIM) 100 MG tablet  Pt states this [facility] [place] [facility] But for what ever reason they did not fill it for BID it was filled For qday, can we call or resend a corrected [facility] for her?

## 2014-11-03 NOTE — Telephone Encounter (Signed)
Med sent to pharmacy correctly. Patient was notified.

## 2014-11-05 ENCOUNTER — Telehealth: Payer: Self-pay

## 2014-11-05 MED ORDER — PREDNISONE 20 MG PO TABS: ORAL_TABLET | ORAL | Status: DC

## 2014-11-05 NOTE — Telephone Encounter (Signed)
Patient is having flare of sciatica in right leg. Can you call in Prednisone or does she need to be seen?

## 2014-11-05 NOTE — Telephone Encounter (Signed)
Notified patient med sent to pharmacy.  

## 2014-11-05 NOTE — Telephone Encounter (Signed)
Short course prednisone, #12, 3 daily for 2 days, 2 daily's for 2 days, 1 daily for 2 days

## 2014-11-09 ENCOUNTER — Telehealth: Payer: Self-pay | Admitting: Family Medicine

## 2014-11-09 ENCOUNTER — Other Ambulatory Visit: Payer: Self-pay | Admitting: Nurse Practitioner

## 2014-11-09 DIAGNOSIS — I10 Essential (primary) hypertension: Secondary | ICD-10-CM

## 2014-11-09 DIAGNOSIS — Z79899 Other long term (current) drug therapy: Secondary | ICD-10-CM

## 2014-11-09 DIAGNOSIS — E119 Type 2 diabetes mellitus without complications: Secondary | ICD-10-CM

## 2014-11-09 DIAGNOSIS — E785 Hyperlipidemia, unspecified: Secondary | ICD-10-CM

## 2014-11-09 DIAGNOSIS — E039 Hypothyroidism, unspecified: Secondary | ICD-10-CM

## 2014-11-09 DIAGNOSIS — M1000 Idiopathic gout, unspecified site: Secondary | ICD-10-CM

## 2014-11-09 NOTE — Telephone Encounter (Signed)
Patient needs BW ordered for upcoming appointment on 12/04/2014.

## 2014-11-09 NOTE — Telephone Encounter (Signed)
done

## 2014-11-10 NOTE — Telephone Encounter (Signed)
[facility] 11/10/14 

## 2014-11-10 NOTE — Telephone Encounter (Signed)
Called patient and informed patient per Pearson Forster, NP that labs were ordered. Patient verbalized understanding.

## 2014-11-12 LAB — BASIC METABOLIC PANEL
BUN/Creatinine Ratio: 26 (ref 11–26)
BUN: 40 mg/dL — AB (ref 8–27)
CALCIUM: 9.8 mg/dL (ref 8.7–10.3)
CHLORIDE: 97 mmol/L (ref 97–108)
CO2: 24 mmol/L (ref 18–29)
Creatinine, Ser: 1.56 mg/dL — ABNORMAL HIGH (ref 0.57–1.00)
GFR calc Af Amer: 40 mL/min/{1.73_m2} — ABNORMAL LOW (ref >59)
GFR calc non Af Amer: 35 mL/min/{1.73_m2} — ABNORMAL LOW (ref >59)
GLUCOSE: 102 mg/dL — AB (ref 65–99)
Potassium: 4.9 mmol/L (ref 3.5–5.2)
Sodium: 140 mmol/L (ref 134–144)

## 2014-11-12 LAB — LIPID PANEL
CHOL/HDL RATIO: 5.6 ratio — AB (ref 0.0–4.4)
CHOLESTEROL TOTAL: 202 mg/dL — AB (ref 100–199)
HDL: 36 mg/dL — AB (ref >39)
LDL CALC: 121 mg/dL — AB (ref 0–99)
TRIGLYCERIDES: 227 mg/dL — AB (ref 0–149)
VLDL Cholesterol Cal: 45 mg/dL — ABNORMAL HIGH (ref 5–40)

## 2014-11-12 LAB — HEPATIC FUNCTION PANEL
ALBUMIN: 4.7 g/dL (ref 3.6–4.8)
ALT: 29 IU/L (ref 0–32)
AST: 35 IU/L (ref 0–40)
Alkaline Phosphatase: 62 IU/L (ref 39–117)
Bilirubin Total: 0.4 mg/dL (ref 0.0–1.2)
Bilirubin, Direct: 0.12 mg/dL (ref 0.00–0.40)
TOTAL PROTEIN: 7.4 g/dL (ref 6.0–8.5)

## 2014-11-12 LAB — HEMOGLOBIN A1C
ESTIMATED AVERAGE GLUCOSE: 143 mg/dL
HEMOGLOBIN A1C: 6.6 % — AB (ref 4.8–5.6)

## 2014-11-12 LAB — TSH: TSH: 3.31 u[IU]/mL (ref 0.450–4.500)

## 2014-11-27 ENCOUNTER — Ambulatory Visit: Payer: 59 | Admitting: Family Medicine

## 2014-11-29 ENCOUNTER — Other Ambulatory Visit: Payer: Self-pay | Admitting: Family Medicine

## 2014-12-02 ENCOUNTER — Other Ambulatory Visit: Payer: Self-pay | Admitting: Family Medicine

## 2014-12-04 ENCOUNTER — Encounter: Payer: Self-pay | Admitting: Nurse Practitioner

## 2014-12-04 ENCOUNTER — Ambulatory Visit (INDEPENDENT_AMBULATORY_CARE_PROVIDER_SITE_OTHER): Payer: 59 | Admitting: Nurse Practitioner

## 2014-12-04 VITALS — BP 122/84 | Ht 70.0 in | Wt 268.0 lb

## 2014-12-04 DIAGNOSIS — F418 Other specified anxiety disorders: Secondary | ICD-10-CM

## 2014-12-04 DIAGNOSIS — G5602 Carpal tunnel syndrome, left upper limb: Secondary | ICD-10-CM

## 2014-12-04 DIAGNOSIS — Z23 Encounter for immunization: Secondary | ICD-10-CM | POA: Diagnosis not present

## 2014-12-04 DIAGNOSIS — F329 Major depressive disorder, single episode, unspecified: Secondary | ICD-10-CM

## 2014-12-04 DIAGNOSIS — F419 Anxiety disorder, unspecified: Principal | ICD-10-CM

## 2014-12-04 MED ORDER — MUPIROCIN 2 % EX OINT: 1.0000 "application " | TOPICAL_OINTMENT | Freq: Two times a day (BID) | CUTANEOUS | Status: DC

## 2014-12-04 NOTE — Patient Instructions (Signed)
Tanzeum or Bydureon once a week

## 2014-12-05 ENCOUNTER — Encounter: Payer: Self-pay | Admitting: Nurse Practitioner

## 2014-12-05 NOTE — Progress Notes (Signed)
Subjective:  Presents for c/o emotional lability, fatigue, moodiness, short term memory loss and difficulty focusing. Very busy lifestyle with minimal down time. No suicidal or homicidal thoughts or ideation. Currently on Celexa 20 mg qd. Also c/o left wrist pain. Waking her up at night. Works on [employer]. Has had surgery on right wrist for CTS.  Objective:   BP 122/84 mmHg  Ht 5\' 10"  (1.778 m)  Wt 268 lb (121.564 kg)  BMI 38.45 kg/m2 NAD. Alert, oriented. Lungs clear. Heart RRR. Tenderness with ROM of lef wrist. Phalen and Tinel's positive.   Assessment: Anxiety and depression  Carpal tunnel syndrome of left wrist  Encounter for immunization  Plan:  Meds ordered this encounter  Medications  . mupirocin ointment (BACTROBAN) 2 %    Sig: Place 1 application into the nose 2 (two) times daily.    Dispense:  22 g    Refill:  0    Order Specific Question:  Supervising Karlis Cregg    Answer:  Mikey Kirschner [[id number]]  . ONE TOUCH ULTRA TEST test strip    Sig:     Refill:  0   Refer to Dr. Fredna Dow for evaluation. Increase Celexa to 1 1/2 tabs po qd. Lengthy discussion on stress reduction. Over 40 minutes spent with patient. Return in about 1 month (around 01/03/2015) for diabetes check up. Call back sooner if any problems.

## 2014-12-08 ENCOUNTER — Other Ambulatory Visit: Payer: Self-pay | Admitting: *Deleted

## 2014-12-08 DIAGNOSIS — G5602 Carpal tunnel syndrome, left upper limb: Secondary | ICD-10-CM

## 2014-12-10 ENCOUNTER — Other Ambulatory Visit: Payer: Self-pay | Admitting: Nurse Practitioner

## 2014-12-10 ENCOUNTER — Telehealth: Payer: Self-pay | Admitting: Family Medicine

## 2014-12-10 MED ORDER — HYDROCODONE-ACETAMINOPHEN 5-325 MG PO TABS: ORAL_TABLET | ORAL | Status: DC

## 2014-12-10 NOTE — Telephone Encounter (Signed)
(  Message for Carolyn)Patient needs refill on hydrocodone 5/325.

## 2014-12-19 ENCOUNTER — Other Ambulatory Visit: Payer: Self-pay | Admitting: Family Medicine

## 2014-12-25 ENCOUNTER — Other Ambulatory Visit: Payer: Self-pay | Admitting: Family Medicine

## 2014-12-30 ENCOUNTER — Other Ambulatory Visit: Payer: Self-pay | Admitting: Family Medicine

## 2014-12-30 ENCOUNTER — Other Ambulatory Visit: Payer: Self-pay | Admitting: Orthopedic Surgery

## 2015-01-01 ENCOUNTER — Encounter: Payer: Self-pay | Admitting: Nurse Practitioner

## 2015-01-01 ENCOUNTER — Ambulatory Visit (INDEPENDENT_AMBULATORY_CARE_PROVIDER_SITE_OTHER): Payer: 59 | Admitting: Nurse Practitioner

## 2015-01-01 VITALS — BP 134/80 | Ht 70.0 in | Wt 267.0 lb

## 2015-01-01 DIAGNOSIS — E118 Type 2 diabetes mellitus with unspecified complications: Secondary | ICD-10-CM | POA: Diagnosis not present

## 2015-01-01 DIAGNOSIS — E785 Hyperlipidemia, unspecified: Secondary | ICD-10-CM | POA: Diagnosis not present

## 2015-01-01 MED ORDER — CITALOPRAM HYDROBROMIDE 20 MG PO TABS: ORAL_TABLET | ORAL | Status: DC

## 2015-01-01 NOTE — Progress Notes (Signed)
Subjective:  Presents for routine follow-up. No chest pain/ischemic type pain or shortness of breath. Has not had her Zostavax vaccine. Has had her flu vaccine, has had a pneumonia vaccine in the past but unsure about date. Fairly active with her grandchildren. No changes in her diet.  Objective:   BP 134/80 mmHg  Ht 5\' 10"  (1.778 m)  Wt 267 lb (121.11 kg)  BMI 38.31 kg/m2 NAD. Alert, oriented. Lungs clear. Heart regular rate rhythm. Diabetic Foot Exam - Simple   Simple Foot Form  Diabetic Foot exam was performed with the following findings:  Yes 01/01/2015  9:30 AM  Visual Inspection  No deformities, no ulcerations, no other skin breakdown bilaterally:  Yes  Sensation Testing  Intact to touch and monofilament testing bilaterally:  Yes  Pulse Check  See comments:  Yes  Comments  DP pulse present bilat; normal cap refill. Several hammertoes on right foot.        Assessment:  Problem List Items Addressed This Visit      Endocrine   Type 2 diabetes mellitus ([place]) - Primary   Relevant Orders   Microalbumin / creatinine urine ratio     Other   Hyperlipidemia   Relevant Orders   Lipid panel   Hepatic function panel     Plan:  Meds ordered this encounter  Medications  . citalopram (CELEXA) 20 MG tablet    Sig: 1 1/2 tabs po qd    Dispense:  45 tablet    Refill:  5    Order Specific Question:  Supervising Provider    Answer:  Maggie Font   Last prescription for Celexa was not correct amount, new prescription sent in. Encourage weight loss and regular activity. Given prescription for Zostavax. Recommend daily omega-3 supplement. Repeat lab work including urine microalbumin late November/early December. Return in about 3 months (around 04/03/2015) for recheck.

## 2015-01-01 NOTE — Patient Instructions (Signed)
Take 2 Omega Red's with Pravastatin daily.

## 2015-01-07 ENCOUNTER — Encounter (HOSPITAL_BASED_OUTPATIENT_CLINIC_OR_DEPARTMENT_OTHER): Payer: Self-pay | Admitting: *Deleted

## 2015-01-08 ENCOUNTER — Encounter (HOSPITAL_BASED_OUTPATIENT_CLINIC_OR_DEPARTMENT_OTHER)
Admission: RE | Admit: 2015-01-08 | Discharge: 2015-01-08 | Disposition: A | Discharge status: Home / Self Care | Payer: 59 | Source: Ambulatory Visit | Attending: Orthopedic Surgery | Admitting: Orthopedic Surgery

## 2015-01-08 DIAGNOSIS — Z7984 Long term (current) use of oral hypoglycemic drugs: Secondary | ICD-10-CM | POA: Diagnosis not present

## 2015-01-08 DIAGNOSIS — Z7982 Long term (current) use of aspirin: Secondary | ICD-10-CM | POA: Diagnosis not present

## 2015-01-08 DIAGNOSIS — I10 Essential (primary) hypertension: Secondary | ICD-10-CM | POA: Diagnosis not present

## 2015-01-08 DIAGNOSIS — Z87891 Personal history of nicotine dependence: Secondary | ICD-10-CM | POA: Diagnosis not present

## 2015-01-08 DIAGNOSIS — Z79891 Long term (current) use of opiate analgesic: Secondary | ICD-10-CM | POA: Diagnosis not present

## 2015-01-08 DIAGNOSIS — E039 Hypothyroidism, unspecified: Secondary | ICD-10-CM | POA: Diagnosis not present

## 2015-01-08 DIAGNOSIS — M1990 Unspecified osteoarthritis, unspecified site: Secondary | ICD-10-CM | POA: Diagnosis not present

## 2015-01-08 DIAGNOSIS — K219 Gastro-esophageal reflux disease without esophagitis: Secondary | ICD-10-CM | POA: Diagnosis not present

## 2015-01-08 DIAGNOSIS — Z6838 Body mass index (BMI) 38.0-38.9, adult: Secondary | ICD-10-CM | POA: Diagnosis not present

## 2015-01-08 DIAGNOSIS — E119 Type 2 diabetes mellitus without complications: Secondary | ICD-10-CM | POA: Diagnosis not present

## 2015-01-08 DIAGNOSIS — Z79899 Other long term (current) drug therapy: Secondary | ICD-10-CM | POA: Diagnosis not present

## 2015-01-08 DIAGNOSIS — G5602 Carpal tunnel syndrome, left upper limb: Secondary | ICD-10-CM | POA: Diagnosis not present

## 2015-01-08 DIAGNOSIS — M109 Gout, unspecified: Secondary | ICD-10-CM | POA: Diagnosis not present

## 2015-01-08 LAB — BASIC METABOLIC PANEL
Anion gap: 8 (ref 5–15)
BUN: 29 mg/dL — AB (ref 6–20)
CHLORIDE: 101 mmol/L (ref 101–111)
CO2: 28 mmol/L (ref 22–32)
Calcium: 9.4 mg/dL (ref 8.9–10.3)
Creatinine, Ser: 1.51 mg/dL — ABNORMAL HIGH (ref 0.44–1.00)
GFR calc Af Amer: 41 mL/min — ABNORMAL LOW (ref >60)
GFR calc non Af Amer: 36 mL/min — ABNORMAL LOW (ref >60)
GLUCOSE: 115 mg/dL — AB (ref 65–99)
POTASSIUM: 4.8 mmol/L (ref 3.5–5.1)
Sodium: 137 mmol/L (ref 135–145)

## 2015-01-14 ENCOUNTER — Encounter (HOSPITAL_BASED_OUTPATIENT_CLINIC_OR_DEPARTMENT_OTHER)
Admission: RE | Disposition: A | Discharge status: Home / Self Care | Payer: Self-pay | Source: Ambulatory Visit | Attending: Orthopedic Surgery

## 2015-01-14 ENCOUNTER — Ambulatory Visit (HOSPITAL_BASED_OUTPATIENT_CLINIC_OR_DEPARTMENT_OTHER): Payer: 59 | Admitting: Anesthesiology

## 2015-01-14 ENCOUNTER — Ambulatory Visit (HOSPITAL_BASED_OUTPATIENT_CLINIC_OR_DEPARTMENT_OTHER)
Admission: RE | Admit: 2015-01-14 | Discharge: 2015-01-14 | Disposition: A | Discharge status: Home / Self Care | Payer: 59 | Source: Ambulatory Visit | Attending: Orthopedic Surgery | Admitting: Orthopedic Surgery

## 2015-01-14 ENCOUNTER — Encounter (HOSPITAL_BASED_OUTPATIENT_CLINIC_OR_DEPARTMENT_OTHER): Payer: Self-pay

## 2015-01-14 DIAGNOSIS — I10 Essential (primary) hypertension: Secondary | ICD-10-CM | POA: Insufficient documentation

## 2015-01-14 DIAGNOSIS — M109 Gout, unspecified: Secondary | ICD-10-CM | POA: Insufficient documentation

## 2015-01-14 DIAGNOSIS — E119 Type 2 diabetes mellitus without complications: Secondary | ICD-10-CM | POA: Insufficient documentation

## 2015-01-14 DIAGNOSIS — Z79891 Long term (current) use of opiate analgesic: Secondary | ICD-10-CM | POA: Insufficient documentation

## 2015-01-14 DIAGNOSIS — E039 Hypothyroidism, unspecified: Secondary | ICD-10-CM | POA: Insufficient documentation

## 2015-01-14 DIAGNOSIS — Z79899 Other long term (current) drug therapy: Secondary | ICD-10-CM | POA: Insufficient documentation

## 2015-01-14 DIAGNOSIS — Z87891 Personal history of nicotine dependence: Secondary | ICD-10-CM | POA: Insufficient documentation

## 2015-01-14 DIAGNOSIS — G5602 Carpal tunnel syndrome, left upper limb: Secondary | ICD-10-CM | POA: Diagnosis not present

## 2015-01-14 DIAGNOSIS — Z6838 Body mass index (BMI) 38.0-38.9, adult: Secondary | ICD-10-CM | POA: Insufficient documentation

## 2015-01-14 DIAGNOSIS — M1990 Unspecified osteoarthritis, unspecified site: Secondary | ICD-10-CM | POA: Insufficient documentation

## 2015-01-14 DIAGNOSIS — K219 Gastro-esophageal reflux disease without esophagitis: Secondary | ICD-10-CM | POA: Insufficient documentation

## 2015-01-14 DIAGNOSIS — Z7984 Long term (current) use of oral hypoglycemic drugs: Secondary | ICD-10-CM | POA: Insufficient documentation

## 2015-01-14 DIAGNOSIS — Z7982 Long term (current) use of aspirin: Secondary | ICD-10-CM | POA: Insufficient documentation

## 2015-01-14 HISTORY — PX: CARPAL TUNNEL RELEASE: SHX101

## 2015-01-14 LAB — GLUCOSE, CAPILLARY
GLUCOSE-CAPILLARY: 108 mg/dL — AB (ref 65–99)
Glucose-Capillary: 107 mg/dL — ABNORMAL HIGH (ref 65–99)

## 2015-01-14 SURGERY — CARPAL TUNNEL RELEASE
Anesthesia: Monitor Anesthesia Care | Site: Wrist | Laterality: Left

## 2015-01-14 MED ORDER — ONDANSETRON HCL 4 MG/2ML IJ SOLN
INTRAMUSCULAR | Status: AC
  Filled 2015-01-14: qty 2

## 2015-01-14 MED ORDER — SCOPOLAMINE 1 MG/3DAYS TD PT72: 1.0000 | MEDICATED_PATCH | Freq: Once | TRANSDERMAL | Status: DC | PRN

## 2015-01-14 MED ORDER — FENTANYL CITRATE (PF) 100 MCG/2ML IJ SOLN
INTRAMUSCULAR | Status: DC | PRN
  Administered 2015-01-14 (×2): 50 ug via INTRAVENOUS

## 2015-01-14 MED ORDER — HYDROCODONE-ACETAMINOPHEN 5-325 MG PO TABS: ORAL_TABLET | ORAL | Status: DC

## 2015-01-14 MED ORDER — ONDANSETRON HCL 4 MG/2ML IJ SOLN
INTRAMUSCULAR | Status: DC | PRN
  Administered 2015-01-14: 4 mg via INTRAVENOUS

## 2015-01-14 MED ORDER — FENTANYL CITRATE (PF) 100 MCG/2ML IJ SOLN
INTRAMUSCULAR | Status: AC
  Filled 2015-01-14: qty 4

## 2015-01-14 MED ORDER — PROPOFOL 500 MG/50ML IV EMUL
INTRAVENOUS | Status: DC | PRN
  Administered 2015-01-14: 75 ug/kg/min via INTRAVENOUS

## 2015-01-14 MED ORDER — LACTATED RINGERS IV SOLN
INTRAVENOUS | Status: DC
  Administered 2015-01-14: 08:00:00 via INTRAVENOUS

## 2015-01-14 MED ORDER — CEFAZOLIN SODIUM-DEXTROSE 2-3 GM-% IV SOLR
2.0000 g | INTRAVENOUS | Status: AC
  Administered 2015-01-14: 2 g via INTRAVENOUS

## 2015-01-14 MED ORDER — CHLORHEXIDINE GLUCONATE 4 % EX LIQD: 60.0000 mL | Freq: Once | CUTANEOUS | Status: DC

## 2015-01-14 MED ORDER — MIDAZOLAM HCL 5 MG/5ML IJ SOLN
INTRAMUSCULAR | Status: DC | PRN
  Administered 2015-01-14 (×2): 1 mg via INTRAVENOUS

## 2015-01-14 MED ORDER — PROMETHAZINE HCL 25 MG/ML IJ SOLN: 6.2500 mg | INTRAMUSCULAR | Status: DC | PRN

## 2015-01-14 MED ORDER — HYDROMORPHONE HCL 1 MG/ML IJ SOLN: 0.2500 mg | INTRAMUSCULAR | Status: DC | PRN

## 2015-01-14 MED ORDER — MIDAZOLAM HCL 2 MG/2ML IJ SOLN
INTRAMUSCULAR | Status: AC
  Filled 2015-01-14: qty 4

## 2015-01-14 MED ORDER — PROPOFOL 10 MG/ML IV BOLUS
INTRAVENOUS | Status: AC
  Filled 2015-01-14: qty 20

## 2015-01-14 MED ORDER — HYDROCODONE-ACETAMINOPHEN 7.5-325 MG PO TABS: 1.0000 | ORAL_TABLET | Freq: Once | ORAL | Status: DC | PRN

## 2015-01-14 MED ORDER — GLYCOPYRROLATE 0.2 MG/ML IJ SOLN: 0.2000 mg | Freq: Once | INTRAMUSCULAR | Status: DC | PRN

## 2015-01-14 MED ORDER — CEFAZOLIN SODIUM-DEXTROSE 2-3 GM-% IV SOLR
INTRAVENOUS | Status: AC
  Filled 2015-01-14: qty 50

## 2015-01-14 MED ORDER — BUPIVACAINE HCL (PF) 0.25 % IJ SOLN
INTRAMUSCULAR | Status: DC | PRN
  Administered 2015-01-14: 10 mL

## 2015-01-14 SURGICAL SUPPLY — 36 items
BANDAGE ELASTIC 3 VELCRO ST LF (GAUZE/BANDAGES/DRESSINGS) ×3 IMPLANT
BLADE SURG 15 STRL LF DISP TIS (BLADE) ×2 IMPLANT
BLADE SURG 15 STRL SS (BLADE) ×6
BNDG CMPR 9X4 STRL LF SNTH (GAUZE/BANDAGES/DRESSINGS) ×1
BNDG ESMARK 4X9 LF (GAUZE/BANDAGES/DRESSINGS) ×2 IMPLANT
BNDG GAUZE ELAST 4 BULKY (GAUZE/BANDAGES/DRESSINGS) ×3 IMPLANT
CHLORAPREP W/TINT 26ML (MISCELLANEOUS) ×3 IMPLANT
CORDS BIPOLAR (ELECTRODE) ×3 IMPLANT
COVER BACK TABLE 60X90IN (DRAPES) ×3 IMPLANT
COVER MAYO STAND STRL (DRAPES) ×3 IMPLANT
CUFF TOURNIQUET SINGLE 18IN (TOURNIQUET CUFF) ×3 IMPLANT
DRAPE EXTREMITY T 121X128X90 (DRAPE) ×3 IMPLANT
DRAPE SURG 17X23 STRL (DRAPES) ×3 IMPLANT
DRSG PAD ABDOMINAL 8X10 ST (GAUZE/BANDAGES/DRESSINGS) ×3 IMPLANT
GAUZE SPONGE 4X4 12PLY STRL (GAUZE/BANDAGES/DRESSINGS) ×3 IMPLANT
GAUZE XEROFORM 1X8 LF (GAUZE/BANDAGES/DRESSINGS) ×3 IMPLANT
GLOVE BIO SURGEON STRL SZ7.5 (GLOVE) ×3 IMPLANT
GLOVE BIOGEL M STRL SZ7.5 (GLOVE) ×2 IMPLANT
GLOVE BIOGEL PI IND STRL 8 (GLOVE) ×1 IMPLANT
GLOVE BIOGEL PI INDICATOR 8 (GLOVE) ×2
GLOVE EXAM NITRILE EXT CUFF MD (GLOVE) ×2 IMPLANT
GOWN STRL REUS W/ TWL LRG LVL3 (GOWN DISPOSABLE) ×1 IMPLANT
GOWN STRL REUS W/TWL LRG LVL3 (GOWN DISPOSABLE) ×3
GOWN STRL REUS W/TWL XL LVL3 (GOWN DISPOSABLE) ×3 IMPLANT
NDL HYPO 25X1 1.5 SAFETY (NEEDLE) IMPLANT
NEEDLE HYPO 25X1 1.5 SAFETY (NEEDLE) ×3 IMPLANT
NS IRRIG 1000ML POUR BTL (IV SOLUTION) ×3 IMPLANT
PACK BASIN DAY SURGERY FS (CUSTOM PROCEDURE TRAY) ×3 IMPLANT
PADDING CAST ABS 4INX4YD NS (CAST SUPPLIES) ×2
PADDING CAST ABS COTTON 4X4 ST (CAST SUPPLIES) ×1 IMPLANT
STOCKINETTE 4X48 STRL (DRAPES) ×3 IMPLANT
SUT ETHILON 4 0 PS 2 18 (SUTURE) ×3 IMPLANT
SYR BULB 3OZ (MISCELLANEOUS) ×3 IMPLANT
SYR CONTROL 10ML LL (SYRINGE) ×2 IMPLANT
TOWEL OR 17X24 6PK STRL BLUE (TOWEL DISPOSABLE) ×6 IMPLANT
UNDERPAD 30X30 (UNDERPADS AND DIAPERS) ×3 IMPLANT

## 2015-01-14 NOTE — Transfer of Care (Signed)
Immediate Anesthesia Transfer of Care Note  Patient: Sabrina Deleon  Procedure(s) Performed: Procedure(s): LEFT CARPAL TUNNEL RELEASE (Left)  Patient Location: PACU  Anesthesia Type:Bier block  Level of Consciousness: awake, alert  and oriented  Airway & Oxygen Therapy: Patient Spontanous Breathing and Patient connected to face mask oxygen  Post-op Assessment: Report given to RN and Post -op Vital signs reviewed and stable  Post vital signs: Reviewed and stable  Last Vitals:  Filed Vitals:   01/14/15 0805  BP: 135/71  Pulse: 70  Temp: 36.8 C  Resp: 18    Complications: No apparent anesthesia complications

## 2015-01-14 NOTE — Op Note (Signed)
[id number] 

## 2015-01-14 NOTE — H&P (Signed)
Sabrina Deleon is an 64 y.o. female.   Chief Complaint: left carpal tunnel syndrome HPI: 64 yo female with pins and needles sensation in left hand x many years.  This wakes her at night.  Positive nerve conduction studies.  She has had right carpal tunnel release and wishes to have left carpal tunnel release.  Past Medical History  Diagnosis Date  . Hypertension   . Type 2 diabetes mellitus ([place])   . Hypothyroidism   . Arthritis   . GERD (gastroesophageal reflux disease)   . Gout   . Wears glasses   . Wears partial dentures     Upper  . Aortic stenosis     Past Surgical History  Procedure Laterality Date  . Colonoscopy    . Cholecystectomy    . Back surgery      Lumbar  . Carpal tunnel release  11/02/2011    Procedure: CARPAL TUNNEL RELEASE;  Surgeon: Tennis Must, MD;  Location: [place];  Service: Orthopedics;  Laterality: Right;    History reviewed. No pertinent family history. Social History:  reports that she quit smoking about 33 years ago. Her smoking use included Cigarettes. She does not have any smokeless tobacco history on file. She reports that she drinks alcohol. She reports that she does not use illicit drugs.  Allergies: No Known Allergies  Medications Prior to Admission  Medication Sig Dispense Refill  . allopurinol (ZYLOPRIM) 100 MG tablet TAKE 1 TABLET (100 MG TOTAL) BY MOUTH 2 (TWO) TIMES DAILY. 60 tablet 2  . aspirin 81 MG tablet Take 81 mg by mouth daily.    . citalopram (CELEXA) 20 MG tablet 1 1/2 tabs po qd 45 tablet 5  . furosemide (LASIX) 40 MG tablet take 1 tablet once daily 30 tablet 0  . glipiZIDE (GLUCOTROL) 10 MG tablet take 1 tablet twice a day with food 180 tablet 1  . HYDROcodone-acetaminophen (NORCO/VICODIN) 5-325 MG per tablet TAKE 1 TABLET BY MOUTH EVERY 4 TO 6 HOURS AS NEEDED FOR PAIN 30 tablet 0  . ketoconazole (NIZORAL) 2 % cream Apply 1 application topically 2 (two) times daily as needed for irritation. 60 g 2  .  levothyroxine (SYNTHROID, LEVOTHROID) 112 MCG tablet take 1 tablet by mouth once daily 30 tablet 2  . lisinopril (PRINIVIL,ZESTRIL) 20 MG tablet take 1 tablet once daily 90 tablet 1  . meloxicam (MOBIC) 15 MG tablet TAKE 1 TABLET (15 MG TOTAL) BY MOUTH DAILY. 30 tablet 5  . metFORMIN (GLUCOPHAGE) 500 MG tablet take 1 tablet by mouth twice a day with food 180 tablet 1  . MITIGARE 0.6 MG CAPS TAKE ONE CAPSULE BY MOUTH EVERY DAY 30 capsule 3  . mupirocin ointment (BACTROBAN) 2 % Place 1 application into the nose 2 (two) times daily. 22 g 0  . omeprazole (PRILOSEC) 20 MG capsule Take 20 mg by mouth daily.    . ONE TOUCH ULTRA TEST test strip   0  . pravastatin (PRAVACHOL) 40 MG tablet take 1 tablet once daily 90 tablet 1  . zolpidem (AMBIEN) 10 MG tablet TAKE 1 TABLET AT BEDTIME 30 tablet 4    Results for orders placed or performed during the hospital encounter of 01/14/15 (from the past 48 hour(s))  Glucose, capillary     Status: Abnormal   Collection Time: 01/14/15  8:22 AM  Result Value Ref Range   Glucose-Capillary 108 (H) 65 - 99 mg/dL    No results found.   A  comprehensive review of systems was negative except for: Eyes: positive for contacts/glasses Ears, nose, mouth, throat, and face: positive for tinnitus Behavioral/Psych: positive for depression  Blood pressure 135/71, pulse 70, temperature 98.3 F (36.8 C), temperature source Oral, resp. rate 18, height 5\' 10"  (1.778 m), weight 121.281 kg (267 lb 6 oz), SpO2 100 %.  General appearance: alert, cooperative and appears stated age Head: Normocephalic, without obvious abnormality, atraumatic Neck: supple, symmetrical, trachea midline Resp: clear to auscultation bilaterally Cardio: regular rate and rhythm GI: non tender Extremities: intact sensation and capillary refill all digits.  +epl/fpl/io.  no wounds. Pulses: 2+ and symmetric Skin: Skin color, texture, turgor normal. No rashes or lesions Neurologic: Grossly  normal Incision/Wound: none  Assessment/Plan Left carpal tunnel syndrome.  Non operative and operative treatment options were discussed with the patient and patient wishes to proceed with operative treatment. Risks, benefits, and alternatives of surgery were discussed and the patient agrees with the plan of care.   Lyan Holck R 01/14/2015, 8:31 AM

## 2015-01-14 NOTE — Brief Op Note (Signed)
01/14/2015  9:15 AM  PATIENT:  Sabrina Deleon  64 y.o. female  PRE-OPERATIVE DIAGNOSIS:  LEFT CARPAL TUNNEL SYNDROME  POST-OPERATIVE DIAGNOSIS:  LEFT CARPAL TUNNEL SYNDROME  PROCEDURE:  Procedure(s): LEFT CARPAL TUNNEL RELEASE (Left)  SURGEON:  Surgeon(s) and Role:    * Leanora Cover, MD - Primary  PHYSICIAN ASSISTANT:   ASSISTANTS: none   ANESTHESIA:   Bier block with sedation  EBL:     BLOOD ADMINISTERED:none  DRAINS: none   LOCAL MEDICATIONS USED:  MARCAINE     SPECIMEN:  No Specimen  DISPOSITION OF SPECIMEN:  N/A  COUNTS:  YES  TOURNIQUET:   Total Tourniquet Time Documented: Forearm (Left) - 25 minutes Total: Forearm (Left) - 25 minutes   DICTATION: .Other Dictation: Dictation Number [id number]  PLAN OF CARE: Discharge to home after PACU  PATIENT DISPOSITION:  PACU - hemodynamically stable.

## 2015-01-14 NOTE — Anesthesia Postprocedure Evaluation (Signed)
  Anesthesia Post-op Note  Patient: Sabrina Deleon  Procedure(s) Performed: Procedure(s): LEFT CARPAL TUNNEL RELEASE (Left)  Patient Location: PACU  Anesthesia Type:MAC and Bier block  Level of Consciousness: awake and alert   Airway and Oxygen Therapy: Patient Spontanous Breathing  Post-op Pain: none  Post-op Assessment: Post-op Vital signs reviewed              Post-op Vital Signs: Reviewed  Last Vitals:  Filed Vitals:   01/14/15 0933  BP:   Pulse: 61  Temp:   Resp: 16    Complications: No apparent anesthesia complications

## 2015-01-14 NOTE — Op Note (Signed)
Sabrina Deleon, Sabrina Deleon                ACCOUNT NO.:  [id number]  MEDICAL RECORD NO.:  [id number]  LOCATION:                                 FACILITY:  PHYSICIAN:  Leanora Cover, MD             DATE OF BIRTH:  DATE OF PROCEDURE:  01/14/2015 DATE OF DISCHARGE:                              OPERATIVE REPORT   PREOPERATIVE DIAGNOSIS:  Left carpal tunnel syndrome.  POSTOPERATIVE DIAGNOSIS:  Left carpal tunnel syndrome.  PROCEDURE:  Left carpal tunnel release.  SURGEON:  Leanora Cover, MD  ASSISTANT:  None.  ANESTHESIA:  Bier block with sedation.  IV FLUIDS:  Per anesthesia flow sheet.  ESTIMATED BLOOD LOSS:  Minimal.  COMPLICATIONS:  None.  SPECIMENS:  None.  TOURNIQUET TIME:  25 minutes.  DISPOSITION:  Stable to PACU.  INDICATIONS:  Sabrina Deleon is a 64 year old female who has had pins and needle sensation in her fingers for several years.  She has had a right carpal tunnel release.  She wishes to have a left carpal tunnel release. Risks, benefits, and alternatives of surgery were reviewed including the risk of blood loss; infection; damage to nerves, vessels, tendons, ligaments, bone; failure of surgery; need for additional surgery; complications with wound healing, continued pain, recurrence of carpal tunnel syndrome; and damage to motor branch.  She voiced understanding of these risks and elected to proceed.  OPERATIVE COURSE:  After being identified preoperatively by myself, the patient and I agreed upon procedure and site of procedure.  Surgical site was marked.  The risks, benefits, and alternatives of surgery were reviewed and she wished to proceed.  Surgical consent had been signed. She was given IV Ancef as preoperative antibiotic prophylaxis.  She was transferred to the operating room and placed on the operating table in a supine position with the left upper extremity on arm board.  Bier block anesthesia was induced by Anesthesiology.  Left upper extremity was prepped  and draped in normal sterile orthopedic fashion.  Surgical pause was performed between surgeons, anesthesia, and operating room staff, and all were in agreement as to the patient, procedure, and site of procedure.  Tourniquet at the proximal aspect of the forearm had been inflated for the Bier block.  Incision was made over the transverse carpal ligament and carried into subcutaneous tissues by spreading technique.  Bipolar electrocautery was used to obtain hemostasis.  The palmar fascia was sharply incised.  The transverse carpal ligament was identified and incised sharply.  It was incised distally first.  Care was taken to ensure complete decompression distally.  It was then incised proximally.  Scissors were used to split the distal aspect of the volar antebrachial fascia.  A finger was placed into the wound to ensure complete decompression, which was the case.  The nerve was inspected.  It was adherent to the radial leaflet.  The motor branch was identified and it was intact.  The wound was copiously irrigated with sterile saline and closed with 4-0 nylon in a horizontal mattress fashion.  It was injected with 10 mL of 0.25% plain Marcaine to aid in postoperative analgesia.  It was then dressed with  sterile Xeroform, 4x4s, and ABD and wrapped with Kerlix and an Ace bandage.  Tourniquet was deflated at 25 minutes.  Fingertips were pink with brisk capillary refill after deflation of tourniquet.  Operative drapes were broken down.  The patient was awoken from anesthesia safely.  She was transferred back to stretcher and taken to PACU in stable condition.  I will see her back in the office in 1 week for postoperative followup.  I will give her [place], one to two p.o. q.6 hours p.r.n. pain, dispensed #30.     Leanora Cover, MD     KK/MEDQ  D:  01/14/2015  T:  01/14/2015  Job:  [id number]

## 2015-01-14 NOTE — Anesthesia Preprocedure Evaluation (Addendum)
Anesthesia Evaluation  Patient identified by MRN, date of birth, ID band Patient awake    Reviewed: Allergy & Precautions, NPO status , Patient's Chart, lab work & pertinent test results  Airway Mallampati: II  TM Distance: >3 FB Neck ROM: Full    Dental   Pulmonary neg pulmonary ROS, former smoker,    breath sounds clear to auscultation       Cardiovascular hypertension, Pt. on medications + Valvular Problems/Murmurs (Mild AS) AS  Rhythm:Regular Rate:Normal + Systolic murmurs    Neuro/Psych negative neurological ROS     GI/Hepatic Neg liver ROS, GERD  ,  Endo/Other  diabetes, Type 2, Oral Hypoglycemic AgentsHypothyroidism Morbid obesity  Renal/GU Renal InsufficiencyRenal disease     Musculoskeletal   Abdominal   Peds  Hematology negative hematology ROS (+)   Anesthesia Other Findings   Reproductive/Obstetrics                           Anesthesia Physical Anesthesia Plan  ASA: III  Anesthesia Plan: MAC and Bier Block   Post-op Pain Management:    Induction: Intravenous  Airway Management Planned: Simple Face Mask and Natural Airway  Additional Equipment:   Intra-op Plan:   Post-operative Plan:   Informed Consent: I have reviewed the patients History and Physical, chart, labs and discussed the procedure including the risks, benefits and alternatives for the proposed anesthesia with the patient or authorized representative who has indicated his/her understanding and acceptance.     Plan Discussed with: CRNA  Anesthesia Plan Comments:         Anesthesia Quick Evaluation

## 2015-01-14 NOTE — Discharge Instructions (Addendum)

## 2015-01-15 ENCOUNTER — Other Ambulatory Visit: Payer: Self-pay | Admitting: Nurse Practitioner

## 2015-01-15 ENCOUNTER — Encounter (HOSPITAL_BASED_OUTPATIENT_CLINIC_OR_DEPARTMENT_OTHER): Payer: Self-pay | Admitting: Orthopedic Surgery

## 2015-01-15 MED ORDER — CITALOPRAM HYDROBROMIDE 20 MG PO TABS: ORAL_TABLET | ORAL | Status: DC

## 2015-01-30 ENCOUNTER — Other Ambulatory Visit: Payer: Self-pay | Admitting: Family Medicine

## 2015-02-12 ENCOUNTER — Other Ambulatory Visit: Payer: Self-pay | Admitting: Family Medicine

## 2015-02-15 ENCOUNTER — Other Ambulatory Visit: Payer: Self-pay | Admitting: *Deleted

## 2015-02-15 ENCOUNTER — Other Ambulatory Visit: Payer: Self-pay | Admitting: Family Medicine

## 2015-02-15 MED ORDER — ALLOPURINOL 100 MG PO TABS: 100.0000 mg | ORAL_TABLET | Freq: Two times a day (BID) | ORAL | Status: DC

## 2015-02-25 ENCOUNTER — Other Ambulatory Visit: Payer: Self-pay | Admitting: Family Medicine

## 2015-02-26 NOTE — Telephone Encounter (Signed)
This and 4 refills 

## 2015-03-20 LAB — LIPID PANEL
CHOLESTEROL TOTAL: 179 mg/dL (ref 100–199)
Chol/HDL Ratio: 5.4 ratio units — ABNORMAL HIGH (ref 0.0–4.4)
HDL: 33 mg/dL — ABNORMAL LOW (ref >39)
LDL CALC: 102 mg/dL — AB (ref 0–99)
Triglycerides: 221 mg/dL — ABNORMAL HIGH (ref 0–149)
VLDL Cholesterol Cal: 44 mg/dL — ABNORMAL HIGH (ref 5–40)

## 2015-03-20 LAB — HEPATIC FUNCTION PANEL
ALBUMIN: 4.2 g/dL (ref 3.6–4.8)
ALT: 22 IU/L (ref 0–32)
AST: 41 IU/L — AB (ref 0–40)
Alkaline Phosphatase: 63 IU/L (ref 39–117)
BILIRUBIN TOTAL: 0.3 mg/dL (ref 0.0–1.2)
Bilirubin, Direct: 0.08 mg/dL (ref 0.00–0.40)
TOTAL PROTEIN: 6.6 g/dL (ref 6.0–8.5)

## 2015-03-20 LAB — MICROALBUMIN / CREATININE URINE RATIO
Creatinine, Urine: 112.5 mg/dL
Microalbumin, Urine: <3 ug/mL

## 2015-04-09 ENCOUNTER — Ambulatory Visit: Payer: 59 | Admitting: Family Medicine

## 2015-04-13 ENCOUNTER — Other Ambulatory Visit: Payer: Self-pay | Admitting: Nurse Practitioner

## 2015-04-13 MED ORDER — COLCHICINE 0.6 MG PO TABS: 0.6000 mg | ORAL_TABLET | Freq: Every day | ORAL | Status: DC

## 2015-04-20 ENCOUNTER — Telehealth: Payer: Self-pay

## 2015-04-20 MED ORDER — HYDROCODONE-ACETAMINOPHEN 5-325 MG PO TABS: ORAL_TABLET | ORAL | Status: DC

## 2015-04-20 NOTE — Telephone Encounter (Signed)
May have refill

## 2015-04-20 NOTE — Telephone Encounter (Signed)
Patient was notified.

## 2015-04-20 NOTE — Telephone Encounter (Signed)
Patient needs a refill on hydrocodone. Last filled #30 11/2014 and has appointment with Hoyle Sauer 04/23/15.

## 2015-04-21 ENCOUNTER — Other Ambulatory Visit: Payer: Self-pay | Admitting: Family Medicine

## 2015-04-23 ENCOUNTER — Ambulatory Visit (INDEPENDENT_AMBULATORY_CARE_PROVIDER_SITE_OTHER): Payer: BLUE CROSS/BLUE SHIELD | Admitting: Nurse Practitioner

## 2015-04-23 ENCOUNTER — Encounter: Payer: Self-pay | Admitting: Nurse Practitioner

## 2015-04-23 VITALS — BP 122/84 | Ht 68.5 in | Wt 265.0 lb

## 2015-04-23 DIAGNOSIS — Z780 Asymptomatic menopausal state: Secondary | ICD-10-CM

## 2015-04-23 DIAGNOSIS — M791 Myalgia, unspecified site: Secondary | ICD-10-CM

## 2015-04-23 DIAGNOSIS — M2550 Pain in unspecified joint: Secondary | ICD-10-CM | POA: Diagnosis not present

## 2015-04-23 DIAGNOSIS — R252 Cramp and spasm: Secondary | ICD-10-CM | POA: Diagnosis not present

## 2015-04-23 DIAGNOSIS — Z79899 Other long term (current) drug therapy: Secondary | ICD-10-CM | POA: Diagnosis not present

## 2015-04-23 DIAGNOSIS — E118 Type 2 diabetes mellitus with unspecified complications: Secondary | ICD-10-CM

## 2015-04-23 DIAGNOSIS — I10 Essential (primary) hypertension: Secondary | ICD-10-CM

## 2015-04-23 DIAGNOSIS — R5382 Chronic fatigue, unspecified: Secondary | ICD-10-CM

## 2015-04-23 NOTE — Progress Notes (Signed)
Subjective:  Presents for routine follow-up. Limited activity. Migratory diffuse joint pain. Worse in the left hip and knee area. Has a history of osteoarthritis. Also diffuse muscle aches and fatigue. Her gout has been stable. Takes daily vitamin D. Continues to have stress, doing fairly well on Celexa. Some cramping in the lower extremities at times. Takes omeprazole. Is due for a DEXA scan.  Objective:   BP 122/84 mmHg  Ht 5' 8.5" (1.74 m)  Wt 265 lb (120.203 kg)  BMI 39.70 kg/m2  NAD. Alert, oriented. Mildly fatigued in appearance. Lungs clear. Heart regular rate rhythm. Pressure points positive upper and lower body both sides. Significant osteoarthritis changes to both hands.  Assessment:  Problem List Items Addressed This Visit      Cardiovascular and Mediastinum   Hypertension - Primary   Relevant Orders   Basic metabolic panel     Endocrine   Type 2 diabetes mellitus ([place])   Relevant Orders   Hemoglobin A1c    Other Visit Diagnoses    Myalgia        Relevant Orders    Sedimentation rate    Rheumatoid factor    ANA    Magnesium    Arthralgia        Relevant Orders    Sedimentation rate    Rheumatoid factor    ANA    Magnesium    Chronic fatigue        Cramp of both lower extremities        Relevant Orders    Magnesium    Basic metabolic panel    Post-menopause        Relevant Orders    DG Bone Density    High risk medication use        Relevant Orders    Basic metabolic panel      Plan:  Labs pending. Strongly encourage activity as tolerated. Stress reduction. Consider orthopedic referral if arthritic pain continues. Return in about 3 months (around 07/21/2015).

## 2015-04-24 LAB — BASIC METABOLIC PANEL
BUN / CREAT RATIO: 21 (ref 11–26)
BUN: 37 mg/dL — AB (ref 8–27)
CO2: 24 mmol/L (ref 18–29)
CREATININE: 1.78 mg/dL — AB (ref 0.57–1.00)
Calcium: 9.6 mg/dL (ref 8.7–10.3)
Chloride: 100 mmol/L (ref 96–106)
GFR calc Af Amer: 35 mL/min/{1.73_m2} — ABNORMAL LOW (ref >59)
GFR, EST NON AFRICAN AMERICAN: 30 mL/min/{1.73_m2} — AB (ref >59)
GLUCOSE: 111 mg/dL — AB (ref 65–99)
Potassium: 4.9 mmol/L (ref 3.5–5.2)
Sodium: 140 mmol/L (ref 134–144)

## 2015-04-24 LAB — RHEUMATOID FACTOR: Rhuematoid fact SerPl-aCnc: <10 IU/mL (ref 0.0–13.9)

## 2015-04-24 LAB — SEDIMENTATION RATE: SED RATE: 11 mm/h (ref 0–40)

## 2015-04-24 LAB — HEMOGLOBIN A1C
ESTIMATED AVERAGE GLUCOSE: 140 mg/dL
Hgb A1c MFr Bld: 6.5 % — ABNORMAL HIGH (ref 4.8–5.6)

## 2015-04-24 LAB — MAGNESIUM: Magnesium: 2 mg/dL (ref 1.6–2.3)

## 2015-04-24 LAB — ANA: Anti Nuclear Antibody(ANA): NEGATIVE

## 2015-04-28 ENCOUNTER — Other Ambulatory Visit: Payer: Self-pay | Admitting: Nurse Practitioner

## 2015-04-28 ENCOUNTER — Telehealth: Payer: Self-pay | Admitting: Nurse Practitioner

## 2015-04-28 ENCOUNTER — Other Ambulatory Visit: Payer: Self-pay

## 2015-04-28 DIAGNOSIS — R7989 Other specified abnormal findings of blood chemistry: Secondary | ICD-10-CM

## 2015-04-28 DIAGNOSIS — R799 Abnormal finding of blood chemistry, unspecified: Secondary | ICD-10-CM

## 2015-04-28 NOTE — Telephone Encounter (Signed)
Received verbal orders from [facility] for patient to do 24hr urine Creatinine clearance. Orders in epic and patient notified.

## 2015-04-29 ENCOUNTER — Other Ambulatory Visit: Payer: Self-pay | Admitting: *Deleted

## 2015-04-29 MED ORDER — GLUCOSE BLOOD VI STRP: ORAL_STRIP | Status: DC

## 2015-04-30 ENCOUNTER — Ambulatory Visit (HOSPITAL_COMMUNITY)
Admission: RE | Admit: 2015-04-30 | Discharge: 2015-04-30 | Disposition: A | Discharge status: Home / Self Care | Payer: BLUE CROSS/BLUE SHIELD | Source: Ambulatory Visit | Attending: Nurse Practitioner | Admitting: Nurse Practitioner

## 2015-04-30 DIAGNOSIS — Z780 Asymptomatic menopausal state: Secondary | ICD-10-CM | POA: Diagnosis present

## 2015-05-01 ENCOUNTER — Other Ambulatory Visit: Payer: Self-pay | Admitting: Family Medicine

## 2015-05-05 ENCOUNTER — Telehealth: Payer: Self-pay | Admitting: Family Medicine

## 2015-05-05 MED ORDER — PREDNISONE 20 MG PO TABS: ORAL_TABLET | ORAL | Status: DC

## 2015-05-05 MED ORDER — LEVOTHYROXINE SODIUM 112 MCG PO TABS: 112.0000 ug | ORAL_TABLET | Freq: Every day | ORAL | Status: DC

## 2015-05-05 NOTE — Telephone Encounter (Signed)
done

## 2015-05-05 NOTE — Telephone Encounter (Signed)
May give 4 refills on medication, may go ahead with prescription and testing supplies as requested

## 2015-05-05 NOTE — Telephone Encounter (Signed)
Needs script for contour Next Monitor & testing supplies, strips (insurance will pay for this brand)  Also needs refill on Levothyroxine   And requesting a script for prednisone for a flare up of gout  [facility]

## 2015-05-14 ENCOUNTER — Other Ambulatory Visit: Payer: Self-pay | Admitting: *Deleted

## 2015-05-14 DIAGNOSIS — Z79899 Other long term (current) drug therapy: Secondary | ICD-10-CM

## 2015-05-14 MED ORDER — HYDROCODONE-ACETAMINOPHEN 10-325 MG PO TABS: 1.0000 | ORAL_TABLET | ORAL | Status: DC | PRN

## 2015-05-19 LAB — CREATININE CLEARANCE, URINE, 24 HOUR
CREATININE: 1.49 mg/dL — AB (ref 0.57–1.00)
Creatinine Clearance: 64 mL/min — ABNORMAL LOW (ref 88–128)
Creatinine, 24H Ur: 1378 mg/24 hr (ref 800–1800)
Creatinine, Urine: 55.1 mg/dL
GFR, EST AFRICAN AMERICAN: 43 mL/min/{1.73_m2} — AB (ref >59)
GFR, EST NON AFRICAN AMERICAN: 37 mL/min/{1.73_m2} — AB (ref >59)

## 2015-05-19 LAB — BASIC METABOLIC PANEL
BUN/Creatinine Ratio: 16 (ref 11–26)
BUN: 24 mg/dL (ref 8–27)
CALCIUM: 9.6 mg/dL (ref 8.7–10.3)
CHLORIDE: 98 mmol/L (ref 96–106)
CO2: 25 mmol/L (ref 18–29)
CREATININE: 1.54 mg/dL — AB (ref 0.57–1.00)
GFR, EST AFRICAN AMERICAN: 41 mL/min/{1.73_m2} — AB (ref >59)
GFR, EST NON AFRICAN AMERICAN: 36 mL/min/{1.73_m2} — AB (ref >59)
Glucose: 160 mg/dL — ABNORMAL HIGH (ref 65–99)
Potassium: 4.9 mmol/L (ref 3.5–5.2)
Sodium: 138 mmol/L (ref 134–144)

## 2015-06-01 ENCOUNTER — Telehealth: Payer: Self-pay

## 2015-06-01 NOTE — Telephone Encounter (Signed)
Sure (plz avoid this in the future)

## 2015-06-01 NOTE — Telephone Encounter (Signed)
Patient accidentally lost her hydrocodone prescription. Can another one be printed for the patient?

## 2015-06-02 MED ORDER — HYDROCODONE-ACETAMINOPHEN 10-325 MG PO TABS: 1.0000 | ORAL_TABLET | ORAL | Status: DC | PRN

## 2015-06-02 NOTE — Telephone Encounter (Signed)
Done

## 2015-06-03 ENCOUNTER — Encounter: Payer: Self-pay | Admitting: Nurse Practitioner

## 2015-06-03 DIAGNOSIS — N1832 Chronic kidney disease, stage 3b: Secondary | ICD-10-CM | POA: Insufficient documentation

## 2015-06-03 DIAGNOSIS — N183 Chronic kidney disease, stage 3 (moderate): Secondary | ICD-10-CM

## 2015-06-04 LAB — HM DIABETES EYE EXAM

## 2015-06-08 ENCOUNTER — Encounter: Payer: Self-pay | Admitting: *Deleted

## 2015-06-15 ENCOUNTER — Other Ambulatory Visit: Payer: Self-pay | Admitting: Family Medicine

## 2015-06-23 ENCOUNTER — Other Ambulatory Visit: Payer: Self-pay | Admitting: Family Medicine

## 2015-06-27 ENCOUNTER — Other Ambulatory Visit: Payer: Self-pay | Admitting: Family Medicine

## 2015-07-08 ENCOUNTER — Other Ambulatory Visit: Payer: Self-pay | Admitting: Family Medicine

## 2015-07-09 ENCOUNTER — Other Ambulatory Visit: Payer: Self-pay | Admitting: *Deleted
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
## 2015-07-23 ENCOUNTER — Ambulatory Visit: Payer: BLUE CROSS/BLUE SHIELD | Admitting: Nurse Practitioner

## 2015-07-23 ENCOUNTER — Ambulatory Visit: Payer: BLUE CROSS/BLUE SHIELD | Admitting: Family Medicine

## 2015-08-09 ENCOUNTER — Encounter: Payer: Self-pay | Admitting: Family Medicine

## 2015-08-12 ENCOUNTER — Other Ambulatory Visit: Payer: Self-pay | Admitting: Family Medicine

## 2015-08-23 ENCOUNTER — Other Ambulatory Visit: Payer: Self-pay | Admitting: *Deleted

## 2015-08-23 ENCOUNTER — Telehealth: Payer: Self-pay | Admitting: Family Medicine

## 2015-08-23 MED ORDER — HYDROCODONE-ACETAMINOPHEN 5-325 MG PO TABS: ORAL_TABLET | ORAL | Status: DC

## 2015-08-23 MED ORDER — PREDNISONE 20 MG PO TABS: ORAL_TABLET | ORAL | Status: DC

## 2015-08-23 NOTE — Telephone Encounter (Signed)
May have 1 refill each But will need ov before further vicodin rells bcz schedule 2 dryg

## 2015-08-23 NOTE — Telephone Encounter (Signed)
Patient needs refill on hydrocodone 10/325 and prednisone 20 mg for gout.

## 2015-08-23 NOTE — Telephone Encounter (Signed)
Discussed with pt. Prednisone sent to pharm. Hydrocodone ready for pickup.

## 2015-09-17 ENCOUNTER — Encounter: Payer: Self-pay | Admitting: Nurse Practitioner

## 2015-09-17 ENCOUNTER — Ambulatory Visit (INDEPENDENT_AMBULATORY_CARE_PROVIDER_SITE_OTHER): Payer: BLUE CROSS/BLUE SHIELD | Admitting: Nurse Practitioner

## 2015-09-17 VITALS — BP 124/80 | Ht 68.0 in | Wt 256.0 lb

## 2015-09-17 DIAGNOSIS — E119 Type 2 diabetes mellitus without complications: Secondary | ICD-10-CM

## 2015-09-17 DIAGNOSIS — N1832 Chronic kidney disease, stage 3b: Secondary | ICD-10-CM

## 2015-09-17 DIAGNOSIS — E039 Hypothyroidism, unspecified: Secondary | ICD-10-CM

## 2015-09-17 DIAGNOSIS — K921 Melena: Secondary | ICD-10-CM

## 2015-09-17 DIAGNOSIS — E785 Hyperlipidemia, unspecified: Secondary | ICD-10-CM | POA: Diagnosis not present

## 2015-09-17 DIAGNOSIS — I10 Essential (primary) hypertension: Secondary | ICD-10-CM

## 2015-09-17 DIAGNOSIS — N183 Chronic kidney disease, stage 3 (moderate): Secondary | ICD-10-CM

## 2015-09-17 LAB — POCT GLYCOSYLATED HEMOGLOBIN (HGB A1C): Hemoglobin A1C: 6

## 2015-09-17 MED ORDER — HYDROCODONE-ACETAMINOPHEN 5-325 MG PO TABS: ORAL_TABLET | ORAL | Status: DC

## 2015-09-19 LAB — CBC WITH DIFFERENTIAL/PLATELET
BASOS ABS: 0 10*3/uL (ref 0.0–0.2)
Basos: 0 %
EOS (ABSOLUTE): 0.3 10*3/uL (ref 0.0–0.4)
EOS: 4 %
HEMATOCRIT: 28.5 % — AB (ref 34.0–46.6)
HEMOGLOBIN: 9.3 g/dL — AB (ref 11.1–15.9)
IMMATURE GRANS (ABS): 0 10*3/uL (ref 0.0–0.1)
Immature Granulocytes: 1 %
LYMPHS: 37 %
Lymphocytes Absolute: 2.8 10*3/uL (ref 0.7–3.1)
MCH: 29.4 pg (ref 26.6–33.0)
MCHC: 32.6 g/dL (ref 31.5–35.7)
MCV: 90 fL (ref 79–97)
MONOCYTES: 7 %
Monocytes Absolute: 0.5 10*3/uL (ref 0.1–0.9)
NEUTROS ABS: 3.9 10*3/uL (ref 1.4–7.0)
Neutrophils: 51 %
Platelets: 253 10*3/uL (ref 150–379)
RBC: 3.16 x10E6/uL — AB (ref 3.77–5.28)
RDW: 14.6 % (ref 12.3–15.4)
WBC: 7.6 10*3/uL (ref 3.4–10.8)

## 2015-09-19 LAB — LIPID PANEL
CHOLESTEROL TOTAL: 157 mg/dL (ref 100–199)
Chol/HDL Ratio: 3.8 ratio units (ref 0.0–4.4)
HDL: 41 mg/dL (ref >39)
LDL CALC: 89 mg/dL (ref 0–99)
TRIGLYCERIDES: 135 mg/dL (ref 0–149)
VLDL CHOLESTEROL CAL: 27 mg/dL (ref 5–40)

## 2015-09-19 LAB — HEPATIC FUNCTION PANEL
ALBUMIN: 4.2 g/dL (ref 3.6–4.8)
ALT: 17 IU/L (ref 0–32)
AST: 32 IU/L (ref 0–40)
Alkaline Phosphatase: 70 IU/L (ref 39–117)
Bilirubin Total: 0.3 mg/dL (ref 0.0–1.2)
Bilirubin, Direct: 0.1 mg/dL (ref 0.00–0.40)
Total Protein: 6.5 g/dL (ref 6.0–8.5)

## 2015-09-19 LAB — TSH: TSH: 5.21 u[IU]/mL — ABNORMAL HIGH (ref 0.450–4.500)

## 2015-09-20 ENCOUNTER — Encounter: Payer: Self-pay | Admitting: Nurse Practitioner

## 2015-09-20 ENCOUNTER — Encounter: Payer: Self-pay | Admitting: Family Medicine

## 2015-09-20 NOTE — Progress Notes (Signed)
Subjective:  Presents for complaints of very dark black stools for the past 1-2 weeks. No abdominal pain. No fever. Is also due for her regular labs. Is due for a colonoscopy. Takes daily omeprazole for reflux which has been stable. Compliant with medications. No chest pain/ischemic type pain or shortness of breath. Requesting a refill on her pain medication, has had increased pain recently after prolonged walking during her vacation.  Objective:   BP 124/80 mmHg  Ht 5\' 8"  (1.727 m)  Wt 256 lb (116.121 kg)  BMI 38.93 kg/m2 NAD. Alert, oriented. Lungs clear. Heart regular rate rhythm. Abdomen mildly obese soft nondistended nontender. No obvious masses but exam limited due to abdominal girth.  Assessment:  Problem List Items Addressed This Visit      Cardiovascular and Mediastinum   Hypertension   Relevant Orders   CBC with Differential/Platelet (Completed)   Lipid panel (Completed)   Hepatic function panel (Completed)   TSH (Completed)   POC Hemoccult Bld/Stl (3-Cd Home Screen)     Endocrine   Hypothyroidism   Relevant Orders   CBC with Differential/Platelet (Completed)   Lipid panel (Completed)   Hepatic function panel (Completed)   TSH (Completed)   POC Hemoccult Bld/Stl (3-Cd Home Screen)   Type 2 diabetes mellitus ([facility]) - Primary   Relevant Orders   POCT glycosylated hemoglobin (Hb A1C) (Completed)   CBC with Differential/Platelet (Completed)   Lipid panel (Completed)   Hepatic function panel (Completed)   TSH (Completed)   POC Hemoccult Bld/Stl (3-Cd Home Screen)     Genitourinary   Chronic kidney disease (CKD) stage G3b/A1, moderately decreased glomerular filtration rate (GFR) between 30-44 mL/min/1.73 square meter and albuminuria creatinine ratio less than 30 mg/g   Relevant Orders   CBC with Differential/Platelet (Completed)   Lipid panel (Completed)   Hepatic function panel (Completed)   TSH (Completed)   POC Hemoccult Bld/Stl (3-Cd Home Screen)     Other   Hyperlipidemia   Relevant Orders   CBC with Differential/Platelet (Completed)   Lipid panel (Completed)   Hepatic function panel (Completed)   TSH (Completed)   POC Hemoccult Bld/Stl (3-Cd Home Screen)    Other Visit Diagnoses    Melena        Relevant Orders    CBC with Differential/Platelet (Completed)    Lipid panel (Completed)    Hepatic function panel (Completed)    TSH (Completed)    POC Hemoccult Bld/Stl (3-Cd Home Screen)    Ambulatory referral to Gastroenterology      Plan:  Meds ordered this encounter  Medications  . HYDROcodone-acetaminophen (NORCO/VICODIN) 5-325 MG tablet    Sig: TAKE 1 TABLET BY MOUTH EVERY 4 TO 6 HOURS AS NEEDED FOR PAIN    Dispense:  30 tablet    Refill:  0    Order Specific Question:  Supervising Provider    Answer:  Maggie Font   Lab work pending. Refer back to gastroenterology for evaluation of her stools as well as to discuss her colonoscopy. Warning signs reviewed. Patient to call back sooner if any problems. Return in about 4 months (around 01/17/2016) for recheck.

## 2015-09-21 ENCOUNTER — Encounter: Payer: Self-pay | Admitting: Nurse Practitioner

## 2015-09-22 ENCOUNTER — Other Ambulatory Visit: Payer: Self-pay | Admitting: Nurse Practitioner

## 2015-09-22 ENCOUNTER — Other Ambulatory Visit: Payer: Self-pay

## 2015-09-22 DIAGNOSIS — E119 Type 2 diabetes mellitus without complications: Secondary | ICD-10-CM

## 2015-09-22 DIAGNOSIS — E039 Hypothyroidism, unspecified: Secondary | ICD-10-CM

## 2015-09-22 DIAGNOSIS — K921 Melena: Secondary | ICD-10-CM

## 2015-09-22 DIAGNOSIS — E785 Hyperlipidemia, unspecified: Secondary | ICD-10-CM

## 2015-09-22 DIAGNOSIS — I10 Essential (primary) hypertension: Secondary | ICD-10-CM

## 2015-09-22 DIAGNOSIS — N183 Chronic kidney disease, stage 3 (moderate): Secondary | ICD-10-CM

## 2015-09-22 DIAGNOSIS — N1832 Chronic kidney disease, stage 3b: Secondary | ICD-10-CM

## 2015-09-22 LAB — POC HEMOCCULT BLD/STL (HOME/3-CARD/SCREEN)
FECAL OCCULT BLD: NEGATIVE
FECAL OCCULT BLD: NEGATIVE
FECAL OCCULT BLD: NEGATIVE

## 2015-09-22 MED ORDER — LEVOTHYROXINE SODIUM 125 MCG PO TABS: 125.0000 ug | ORAL_TABLET | Freq: Every day | ORAL | Status: DC

## 2015-09-23 ENCOUNTER — Ambulatory Visit (INDEPENDENT_AMBULATORY_CARE_PROVIDER_SITE_OTHER): Payer: BLUE CROSS/BLUE SHIELD | Admitting: Internal Medicine

## 2015-09-23 ENCOUNTER — Other Ambulatory Visit (INDEPENDENT_AMBULATORY_CARE_PROVIDER_SITE_OTHER): Payer: Self-pay | Admitting: Internal Medicine

## 2015-09-23 ENCOUNTER — Encounter (INDEPENDENT_AMBULATORY_CARE_PROVIDER_SITE_OTHER): Payer: Self-pay | Admitting: *Deleted

## 2015-09-23 ENCOUNTER — Encounter (INDEPENDENT_AMBULATORY_CARE_PROVIDER_SITE_OTHER): Payer: Self-pay | Admitting: Internal Medicine

## 2015-09-23 ENCOUNTER — Other Ambulatory Visit (INDEPENDENT_AMBULATORY_CARE_PROVIDER_SITE_OTHER): Payer: Self-pay | Admitting: *Deleted

## 2015-09-23 VITALS — BP 138/52 | HR 60 | Temp 96.8°F | Ht 68.0 in | Wt 255.4 lb

## 2015-09-23 DIAGNOSIS — K921 Melena: Secondary | ICD-10-CM

## 2015-09-23 DIAGNOSIS — R195 Other fecal abnormalities: Secondary | ICD-10-CM | POA: Diagnosis not present

## 2015-09-23 MED ORDER — PEG 3350-KCL-NA BICARB-NACL 420 G PO SOLR: 4000.0000 mL | Freq: Once | ORAL | Status: DC

## 2015-09-23 NOTE — Telephone Encounter (Signed)
Patient needs trilyte 

## 2015-09-23 NOTE — Patient Instructions (Signed)
EGD/Colonoscopy. The risks and benefits such as perforation, bleeding, and infection were reviewed with the patient and is agreeable. 

## 2015-09-23 NOTE — Progress Notes (Addendum)
Subjective:    Patient ID: Sabrina Deleon, female    DOB: 05/06/1950, 65 y.o.   MRN: [id number]  HPI Referred by Dr. Sallee Lange for melena. No prior hx of melena.  She says 2 weeks ago she had black stool for 2 weeks. She was not taking Iron during this time. She says she had epigastric pain after she had the black stools. Pain lasted 2 days and then resolved. She just recently started Iron this week.  Three stool cards were negative. Recent Hemoglobin 71/2017 showed hemoglobin of 9.3. No iron studies.  Acid reflux controlled with Omeprazole.   Rarely takes Meloxican unless she has a gout flare. Her last colonoscopy was unknown by Dr. Laural Golden. Appetite has not been good. She has lost about 8 pound over the past 3 weeks. Stools are Popescu in color.            Review of Systems Past Medical History  Diagnosis Date  . Hypertension   . Type 2 diabetes mellitus ([place])   . Hypothyroidism   . Arthritis   . GERD (gastroesophageal reflux disease)   . Gout   . Wears glasses   . Wears partial dentures     Upper  . Aortic stenosis     Past Surgical History  Procedure Laterality Date  . Colonoscopy    . Cholecystectomy    . Back surgery      Lumbar  . Carpal tunnel release  11/02/2011    Procedure: CARPAL TUNNEL RELEASE;  Surgeon: Tennis Must, MD;  Location: [place];  Service: Orthopedics;  Laterality: Right;  . Carpal tunnel release Left 01/14/2015    Procedure: LEFT CARPAL TUNNEL RELEASE;  Surgeon: Leanora Cover, MD;  Location: [place];  Service: Orthopedics;  Laterality: Left;    No Known Allergies  Current Outpatient Prescriptions on File Prior to Visit  Medication Sig Dispense Refill  . allopurinol (ZYLOPRIM) 100 MG tablet TAKE ONE TABLET TWICE A DAY 60 tablet 5  . aspirin 81 MG tablet Take 81 mg by mouth daily.    . citalopram (CELEXA) 20 MG tablet 1 1/2 tabs po qd 45 tablet 5  . COLCRYS 0.6 MG tablet take 1 tablet by mouth once  daily 30 tablet 5  . furosemide (LASIX) 40 MG tablet take 1 tablet once daily 30 tablet 0  . glipiZIDE (GLUCOTROL) 10 MG tablet take 1 tablet by mouth twice a day with food 180 tablet 1  . HYDROcodone-acetaminophen (NORCO/VICODIN) 5-325 MG tablet TAKE 1 TABLET BY MOUTH EVERY 4 TO 6 HOURS AS NEEDED FOR PAIN 30 tablet 0  . ketoconazole (NIZORAL) 2 % cream Apply 1 application topically 2 (two) times daily as needed for irritation. 60 g 2  . levothyroxine (SYNTHROID, LEVOTHROID) 125 MCG tablet Take 1 tablet (125 mcg total) by mouth daily. (Patient taking differently: Take 112 mcg by mouth daily. ) 90 tablet 1  . lisinopril (PRINIVIL,ZESTRIL) 20 MG tablet take 1 tablet by mouth once daily 90 tablet 1  . meloxicam (MOBIC) 15 MG tablet take 1 tablet by mouth once daily (Patient taking differently: as needed) 30 tablet 3  . metFORMIN (GLUCOPHAGE) 500 MG tablet take 1 tablet by mouth twice a day with food 180 tablet 1  . mupirocin ointment (BACTROBAN) 2 % Place 1 application into the nose 2 (two) times daily. 22 g 0  . omeprazole (PRILOSEC) 20 MG capsule Take 20 mg by mouth daily.    . pravastatin (  PRAVACHOL) 40 MG tablet take 1 tablet by mouth once daily 90 tablet 1  . zolpidem (AMBIEN) 10 MG tablet take 1 tablet by mouth at bedtime 30 tablet 4   No current facility-administered medications on file prior to visit.        Objective:   Physical Exam Blood pressure 138/52, pulse 60, temperature 96.8 F (36 C), height 5\' 8"  (1.727 m), weight 255 lb 6.4 oz (115.849 kg).  Alert and oriented. Skin warm and dry. Oral mucosa is moist.   . Sclera anicteric, conjunctivae is pink. Thyroid not enlarged. No cervical lymphadenopathy. Lungs clear. Heart regular rate and rhythm. Loud murmur heard.  Abdomen is soft. Bowel sounds are positive. No hepatomegaly. No abdominal masses felt. No tenderness.  No edema to lower extremities.  Stool Dusza and guaiac positive.        Assessment & Plan:  Melena: PUD needs to  be ruled. Guaiac positive stool: Colonic neoplasm needs to be ruled out. The risks and benefits such as perforation, bleeding, and infection were reviewed with the patient and is agreeable. CBC today

## 2015-09-24 LAB — CBC WITH DIFFERENTIAL/PLATELET
BASOS PCT: 0 %
Basophils Absolute: 0 cells/uL (ref 0–200)
EOS ABS: 268 {cells}/uL (ref 15–500)
Eosinophils Relative: 4 %
HCT: 27.6 % — ABNORMAL LOW (ref 35.0–45.0)
Hemoglobin: 9 g/dL — ABNORMAL LOW (ref 11.7–15.5)
Lymphocytes Relative: 25 %
Lymphs Abs: 1675 cells/uL (ref 850–3900)
MCH: 28.8 pg (ref 27.0–33.0)
MCHC: 32.6 g/dL (ref 32.0–36.0)
MCV: 88.2 fL (ref 80.0–100.0)
MONO ABS: 402 {cells}/uL (ref 200–950)
MONOS PCT: 6 %
MPV: 10.3 fL (ref 7.5–12.5)
NEUTROS ABS: 4355 {cells}/uL (ref 1500–7800)
Neutrophils Relative %: 65 %
PLATELETS: 269 10*3/uL (ref 140–400)
RBC: 3.13 MIL/uL — AB (ref 3.80–5.10)
RDW: 14.5 % (ref 11.0–15.0)
WBC: 6.7 10*3/uL (ref 3.8–10.8)

## 2015-09-25 ENCOUNTER — Other Ambulatory Visit: Payer: Self-pay | Admitting: Family Medicine

## 2015-09-27 ENCOUNTER — Other Ambulatory Visit: Payer: Self-pay | Admitting: Nurse Practitioner

## 2015-09-27 DIAGNOSIS — Z79899 Other long term (current) drug therapy: Secondary | ICD-10-CM

## 2015-09-27 MED ORDER — MELOXICAM 15 MG PO TABS: 15.0000 mg | ORAL_TABLET | Freq: Every day | ORAL | Status: DC

## 2015-09-27 NOTE — Progress Notes (Signed)
Patient still having significant pain in hip. Has received injections. Had no problem while on Mobic. Restart med and recheck kidney function in 2-3 weeks.

## 2015-09-27 NOTE — Telephone Encounter (Signed)
May have this and 4 refills 

## 2015-09-27 NOTE — Addendum Note (Signed)
Addended by: Butch Penny on: 09/27/2015 04:29 PM   Modules accepted: Medications

## 2015-09-28 ENCOUNTER — Other Ambulatory Visit: Payer: Self-pay | Admitting: Orthopedic Surgery

## 2015-09-28 DIAGNOSIS — M545 Low back pain: Secondary | ICD-10-CM

## 2015-10-01 ENCOUNTER — Ambulatory Visit
Admission: RE | Admit: 2015-10-01 | Discharge: 2015-10-01 | Disposition: A | Discharge status: Home / Self Care | Payer: BLUE CROSS/BLUE SHIELD | Source: Ambulatory Visit | Attending: Orthopedic Surgery | Admitting: Orthopedic Surgery

## 2015-10-01 ENCOUNTER — Other Ambulatory Visit: Payer: Self-pay | Admitting: *Deleted

## 2015-10-01 ENCOUNTER — Telehealth: Payer: Self-pay | Admitting: Family Medicine

## 2015-10-01 DIAGNOSIS — M545 Low back pain: Secondary | ICD-10-CM

## 2015-10-01 MED ORDER — HYDROCODONE-ACETAMINOPHEN 5-325 MG PO TABS: ORAL_TABLET | ORAL | Status: DC

## 2015-10-01 NOTE — Telephone Encounter (Signed)
Script ready. Pt notified.  

## 2015-10-01 NOTE — Telephone Encounter (Signed)
Pt is requesting a refill on her hydrocodone.  

## 2015-10-01 NOTE — Telephone Encounter (Signed)
May have refill

## 2015-10-04 ENCOUNTER — Encounter (INDEPENDENT_AMBULATORY_CARE_PROVIDER_SITE_OTHER): Payer: Self-pay | Admitting: *Deleted

## 2015-10-04 ENCOUNTER — Telehealth (INDEPENDENT_AMBULATORY_CARE_PROVIDER_SITE_OTHER): Payer: Self-pay | Admitting: *Deleted

## 2015-10-04 DIAGNOSIS — D649 Anemia, unspecified: Secondary | ICD-10-CM

## 2015-10-04 NOTE — Telephone Encounter (Signed)
.  Per Sabrina Deleon patient is to have labs in 2 weeks.

## 2015-10-15 ENCOUNTER — Telehealth: Payer: Self-pay | Admitting: Family Medicine

## 2015-10-15 NOTE — Telephone Encounter (Signed)
Patient needs lancets and strips for contour next glucometer.     [name]

## 2015-10-15 NOTE — Telephone Encounter (Signed)
Please do one year refill

## 2015-10-15 NOTE — Telephone Encounter (Signed)
Script faxed to pharm. Pt notified.  

## 2015-10-15 NOTE — Telephone Encounter (Signed)
Script printed awaiting signature.  

## 2015-10-18 LAB — HEMOGLOBIN AND HEMATOCRIT, BLOOD
HCT: 28.8 % — ABNORMAL LOW (ref 35.0–45.0)
Hemoglobin: 9.5 g/dL — ABNORMAL LOW (ref 11.7–15.5)

## 2015-10-20 ENCOUNTER — Other Ambulatory Visit (INDEPENDENT_AMBULATORY_CARE_PROVIDER_SITE_OTHER): Payer: Self-pay | Admitting: *Deleted

## 2015-10-20 DIAGNOSIS — D649 Anemia, unspecified: Secondary | ICD-10-CM

## 2015-10-20 DIAGNOSIS — R195 Other fecal abnormalities: Secondary | ICD-10-CM

## 2015-10-22 ENCOUNTER — Encounter (INDEPENDENT_AMBULATORY_CARE_PROVIDER_SITE_OTHER): Payer: Self-pay | Admitting: *Deleted

## 2015-10-26 LAB — BASIC METABOLIC PANEL
BUN/Creatinine Ratio: 19 (ref 12–28)
BUN: 30 mg/dL — ABNORMAL HIGH (ref 8–27)
CALCIUM: 9.5 mg/dL (ref 8.7–10.3)
CHLORIDE: 97 mmol/L (ref 96–106)
CO2: 27 mmol/L (ref 18–29)
Creatinine, Ser: 1.61 mg/dL — ABNORMAL HIGH (ref 0.57–1.00)
GFR, EST AFRICAN AMERICAN: 39 mL/min/{1.73_m2} — AB (ref >59)
GFR, EST NON AFRICAN AMERICAN: 34 mL/min/{1.73_m2} — AB (ref >59)
Glucose: 92 mg/dL (ref 65–99)
POTASSIUM: 4.8 mmol/L (ref 3.5–5.2)
SODIUM: 140 mmol/L (ref 134–144)

## 2015-10-29 ENCOUNTER — Encounter: Payer: Self-pay | Admitting: Nurse Practitioner

## 2015-10-29 ENCOUNTER — Ambulatory Visit (INDEPENDENT_AMBULATORY_CARE_PROVIDER_SITE_OTHER): Payer: BLUE CROSS/BLUE SHIELD | Admitting: Nurse Practitioner

## 2015-10-29 VITALS — BP 128/74 | Ht 68.0 in | Wt 262.1 lb

## 2015-10-29 DIAGNOSIS — N1832 Chronic kidney disease, stage 3b: Secondary | ICD-10-CM

## 2015-10-29 DIAGNOSIS — E039 Hypothyroidism, unspecified: Secondary | ICD-10-CM | POA: Diagnosis not present

## 2015-10-29 DIAGNOSIS — N183 Chronic kidney disease, stage 3 (moderate): Secondary | ICD-10-CM

## 2015-10-29 DIAGNOSIS — M25551 Pain in right hip: Secondary | ICD-10-CM

## 2015-10-29 DIAGNOSIS — G8929 Other chronic pain: Secondary | ICD-10-CM | POA: Diagnosis not present

## 2015-10-29 MED ORDER — HYDROCODONE-ACETAMINOPHEN 7.5-325 MG PO TABS: 1.0000 | ORAL_TABLET | Freq: Four times a day (QID) | ORAL | 0 refills | Status: DC | PRN

## 2015-10-29 NOTE — Progress Notes (Signed)
Subjective:  Presents for routine follow-up. Has an EGD and colonoscopy scheduled later this month. Seeing her GI specialist for anemia. Blood sugars have been running very well, sometimes a little low at nighttime. Has her machine with her today. Unsure about what time the sugars were taken by all or below 200 most of them are below 130. Is not taking her glipizide. Still struggling with her weight. Taking her levothyroxine. Very limited activity due to severe right hip pain. States she's been told by her orthopedic specialist that she will need surgery but is trying to lose weight first. Also has to get anemia corrected. States she has been told that her hip joint is "bone on bone". Causing severe pain with difficulty sleeping and performing normal daily activities.  Objective:   BP 128/74   Ht 5\' 8"  (1.727 m)   Wt 262 lb 2 oz (118.9 kg)   BMI 39.86 kg/m  NAD. Alert, oriented. Lungs clear. Heart regular rate rhythm. Hemoglobin A1c on 7/3 was 6.0. Using a cane to help her balance due to hip pain.  Assessment:  Problem List Items Addressed This Visit      Endocrine   Hypothyroidism     Genitourinary   Chronic kidney disease (CKD) stage G3b/A1, moderately decreased glomerular filtration rate (GFR) between 30-44 mL/min/1.73 square meter and albuminuria creatinine ratio less than 30 mg/g - Primary     Other   Chronic right hip pain    Other Visit Diagnoses   None.    Plan:  Meds ordered this encounter  Medications  . HYDROcodone-acetaminophen (NORCO) 7.5-325 MG tablet    Sig: Take 1 tablet by mouth every 6 (six) hours as needed for moderate pain.    Dispense:  30 tablet    Refill:  0    Order Specific Question:   Supervising Provider    Answer:   Mikey Kirschner [[id number]]   Reminded patient that she is due for a repeat TSH in October. Hold on glipizide for now. Reviewed signs and symptoms with treatment for hypoglycemia. Encourage small frequent meals. Will refer to dietitian for  further education for diabetes and weight loss. The visit was complete with patient approached me in the hallway for a refill on her hydrocodone, pain has been very intense and she has to limit anti-inflammatory medicines due to her kidney function. We will increase the strength 7.5 mg but same number of pills per month at this point. To keep [place] posted on how well this controls her pain. Return if symptoms worsen or fail to improve. A review of the [place] CSRS shows patient filled 2 Rx of hydrocodone 5 mg; first 7/10 and second 7/18. Will observe to see how long current new Rx lasts with increase in strength.Marland Kitchen

## 2015-11-03 ENCOUNTER — Other Ambulatory Visit (INDEPENDENT_AMBULATORY_CARE_PROVIDER_SITE_OTHER): Payer: Self-pay | Admitting: *Deleted

## 2015-11-03 DIAGNOSIS — R195 Other fecal abnormalities: Secondary | ICD-10-CM

## 2015-11-03 DIAGNOSIS — D649 Anemia, unspecified: Secondary | ICD-10-CM

## 2015-11-03 LAB — CBC
HCT: 31.8 % — ABNORMAL LOW (ref 35.0–45.0)
Hemoglobin: 10.3 g/dL — ABNORMAL LOW (ref 11.7–15.5)
MCH: 28.1 pg (ref 27.0–33.0)
MCHC: 32.4 g/dL (ref 32.0–36.0)
MCV: 86.9 fL (ref 80.0–100.0)
MPV: 10.8 fL (ref 7.5–12.5)
PLATELETS: 233 10*3/uL (ref 140–400)
RBC: 3.66 MIL/uL — AB (ref 3.80–5.10)
RDW: 14.5 % (ref 11.0–15.0)
WBC: 5.9 10*3/uL (ref 3.8–10.8)

## 2015-11-04 ENCOUNTER — Telehealth (INDEPENDENT_AMBULATORY_CARE_PROVIDER_SITE_OTHER): Payer: Self-pay | Admitting: Internal Medicine

## 2015-11-04 DIAGNOSIS — D500 Iron deficiency anemia secondary to blood loss (chronic): Secondary | ICD-10-CM

## 2015-11-04 NOTE — Telephone Encounter (Signed)
Results of CBC given to patient. Am going to get iron studies.

## 2015-11-05 LAB — IRON AND TIBC
%SAT: 13 % (ref 11–50)
Iron: 52 ug/dL (ref 45–160)
TIBC: 393 ug/dL (ref 250–450)
UIBC: 341 ug/dL (ref 125–400)

## 2015-11-05 LAB — FERRITIN: Ferritin: 24 ng/mL (ref 20–288)

## 2015-11-12 ENCOUNTER — Encounter (HOSPITAL_COMMUNITY): Payer: Self-pay | Admitting: *Deleted

## 2015-11-12 ENCOUNTER — Encounter (HOSPITAL_COMMUNITY)
Admission: RE | Disposition: A | Discharge status: Home / Self Care | Payer: Self-pay | Source: Ambulatory Visit | Attending: Internal Medicine

## 2015-11-12 ENCOUNTER — Ambulatory Visit (HOSPITAL_COMMUNITY)
Admission: RE | Admit: 2015-11-12 | Discharge: 2015-11-12 | Disposition: A | Discharge status: Home / Self Care | Payer: BLUE CROSS/BLUE SHIELD | Source: Ambulatory Visit | Attending: Internal Medicine | Admitting: Internal Medicine

## 2015-11-12 DIAGNOSIS — Z9049 Acquired absence of other specified parts of digestive tract: Secondary | ICD-10-CM | POA: Diagnosis not present

## 2015-11-12 DIAGNOSIS — I10 Essential (primary) hypertension: Secondary | ICD-10-CM | POA: Insufficient documentation

## 2015-11-12 DIAGNOSIS — Z79899 Other long term (current) drug therapy: Secondary | ICD-10-CM | POA: Diagnosis not present

## 2015-11-12 DIAGNOSIS — Z7984 Long term (current) use of oral hypoglycemic drugs: Secondary | ICD-10-CM | POA: Diagnosis not present

## 2015-11-12 DIAGNOSIS — D649 Anemia, unspecified: Secondary | ICD-10-CM | POA: Diagnosis not present

## 2015-11-12 DIAGNOSIS — R195 Other fecal abnormalities: Secondary | ICD-10-CM | POA: Diagnosis not present

## 2015-11-12 DIAGNOSIS — E039 Hypothyroidism, unspecified: Secondary | ICD-10-CM | POA: Diagnosis not present

## 2015-11-12 DIAGNOSIS — E119 Type 2 diabetes mellitus without complications: Secondary | ICD-10-CM | POA: Insufficient documentation

## 2015-11-12 DIAGNOSIS — D62 Acute posthemorrhagic anemia: Secondary | ICD-10-CM | POA: Insufficient documentation

## 2015-11-12 DIAGNOSIS — Z87891 Personal history of nicotine dependence: Secondary | ICD-10-CM | POA: Insufficient documentation

## 2015-11-12 DIAGNOSIS — K317 Polyp of stomach and duodenum: Secondary | ICD-10-CM | POA: Insufficient documentation

## 2015-11-12 DIAGNOSIS — K219 Gastro-esophageal reflux disease without esophagitis: Secondary | ICD-10-CM | POA: Insufficient documentation

## 2015-11-12 DIAGNOSIS — Z982 Presence of cerebrospinal fluid drainage device: Secondary | ICD-10-CM | POA: Diagnosis not present

## 2015-11-12 DIAGNOSIS — M109 Gout, unspecified: Secondary | ICD-10-CM | POA: Diagnosis not present

## 2015-11-12 DIAGNOSIS — K921 Melena: Secondary | ICD-10-CM

## 2015-11-12 DIAGNOSIS — K644 Residual hemorrhoidal skin tags: Secondary | ICD-10-CM | POA: Insufficient documentation

## 2015-11-12 DIAGNOSIS — K228 Other specified diseases of esophagus: Secondary | ICD-10-CM | POA: Diagnosis not present

## 2015-11-12 DIAGNOSIS — Q438 Other specified congenital malformations of intestine: Secondary | ICD-10-CM | POA: Diagnosis not present

## 2015-11-12 HISTORY — PX: COLONOSCOPY: SHX5424

## 2015-11-12 HISTORY — PX: ESOPHAGOGASTRODUODENOSCOPY: SHX5428

## 2015-11-12 LAB — GLUCOSE, CAPILLARY: Glucose-Capillary: 161 mg/dL — ABNORMAL HIGH (ref 65–99)

## 2015-11-12 SURGERY — COLONOSCOPY
Anesthesia: Moderate Sedation

## 2015-11-12 MED ORDER — MEPERIDINE HCL 50 MG/ML IJ SOLN
INTRAMUSCULAR | Status: DC | PRN
  Administered 2015-11-12 (×4): 25 mg via INTRAVENOUS

## 2015-11-12 MED ORDER — MEPERIDINE HCL 50 MG/ML IJ SOLN
INTRAMUSCULAR | Status: DC
Start: 2015-11-12 — End: 2015-11-12
  Filled 2015-11-12: qty 1

## 2015-11-12 MED ORDER — SODIUM CHLORIDE 0.9 % IV SOLN
INTRAVENOUS | Status: DC
  Administered 2015-11-12: 1000 mL via INTRAVENOUS

## 2015-11-12 MED ORDER — MIDAZOLAM HCL 5 MG/5ML IJ SOLN
INTRAMUSCULAR | Status: AC
  Filled 2015-11-12: qty 10

## 2015-11-12 MED ORDER — MIDAZOLAM HCL 5 MG/5ML IJ SOLN
INTRAMUSCULAR | Status: DC | PRN
  Administered 2015-11-12 (×7): 2 mg via INTRAVENOUS

## 2015-11-12 MED ORDER — MIDAZOLAM HCL 5 MG/5ML IJ SOLN
INTRAMUSCULAR | Status: AC
  Filled 2015-11-12: qty 5

## 2015-11-12 MED ORDER — STERILE WATER FOR IRRIGATION IR SOLN
Status: DC | PRN
  Administered 2015-11-12: 11:00:00

## 2015-11-12 NOTE — Op Note (Signed)
[facility] Patient Name: Sabrina Deleon Procedure Date: 11/12/2015 10:52 AM MRN: [id number] Date of Birth: 1951/01/27 Attending MD: Hildred Laser , MD CSN: [id number] Age: 65 Admit Type: Outpatient Procedure:                Colonoscopy Indications:              Heme positive stool, Anemia Providers:                Hildred Laser, MD, Gwenlyn Fudge RN, RN, Randa Spike, Technician Referring MD:             Emilee Hero, MD Medicines:                Meperidine 25 mg IV, Midazolam 6 mg IV Complications:            No immediate complications. Estimated Blood Loss:     Estimated blood loss: none. Procedure:                Pre-Anesthesia Assessment:                           - Prior to the procedure, a History and Physical                            was performed, and patient medications and                            allergies were reviewed. The patient's tolerance of                            previous anesthesia was also reviewed. The risks                            and benefits of the procedure and the sedation                            options and risks were discussed with the patient.                            All questions were answered, and informed consent                            was obtained. Prior Anticoagulants: The patient                            last took aspirin 3 days and previous NSAID                            medication 1 day prior to the procedure. ASA Grade                            Assessment: II - A patient with mild systemic  disease. After reviewing the risks and benefits,                            the patient was deemed in satisfactory condition to                            undergo the procedure.                           After obtaining informed consent, the colonoscope                            was passed under direct vision. Throughout the                            procedure, the patient's  blood pressure, pulse, and                            oxygen saturations were monitored continuously. The                            [id number] scope was introduced through                            the anus and advanced to the the cecum, identified                            by appendiceal orifice and ileocecal valve. The                            colonoscopy was technically difficult and complex                            due to a tortuous colon. Successful completion of                            the procedure was aided by changing the patient to                            a supine position and applying abdominal pressure.                            The patient tolerated the procedure well. The                            quality of the bowel preparation was adequate. The                            ileocecal valve and rectum were photographed.                            appendiceal orifice picture not taken. Scope In: 10:55:03 AM Scope Out: 11:35:20 AM Scope Withdrawal Time: 0 hours 7 minutes 32 seconds  Total  Procedure Duration: 0 hours 40 minutes 17 seconds  Findings:      The sigmoid colon and hepatic flexure were tortuous.      The colon (entire examined portion) appeared normal.      External hemorrhoids were found during retroflexion. The hemorrhoids       were small. Impression:               - Tortuous colon.                           - The entire examined colon is normal.                           - Small external hemorrhoids.                           - No specimens collected. Moderate Sedation:      Moderate (conscious) sedation was administered by the endoscopy nurse       and supervised by the endoscopist. The following parameters were       monitored: oxygen saturation, heart rate, blood pressure, CO2       capnography and response to care. Total physician intraservice time was       40 minutes. Recommendation:           - Patient has a contact number  available for                            emergencies. The signs and symptoms of potential                            delayed complications were discussed with the                            patient. Return to normal activities tomorrow.                            Written discharge instructions were provided to the                            patient.                           - Resume previous diet today.                           - Continue present medications.                           - Resume aspirin at prior dose today.                           - Repeat colonoscopy in 10 years for screening                            purposes. Procedure Code(s):        --- Professional ---  [phone], Colonoscopy, flexible; diagnostic, including                            collection of specimen(s) by brushing or washing,                            when performed (separate procedure)                           99152, Moderate sedation services provided by the                            same physician or other qualified health care                            professional performing the diagnostic or                            therapeutic service that the sedation supports,                            requiring the presence of an independent trained                            observer to assist in the monitoring of the                            patient's level of consciousness and physiological                            status; initial 15 minutes of intraservice time,                            patient age 8 years or older                           [phone], Moderate sedation services; each additional                            15 minutes intraservice time                           99153, Moderate sedation services; each additional                            15 minutes intraservice time Diagnosis Code(s):        --- Professional ---                           R19.5, Other fecal  abnormalities                           D64.9, Anemia, unspecified                           Q43.8, Other specified congenital malformations  of                            intestine CPT copyright 2016 American [employer]. All rights reserved. The codes documented in this report are preliminary and upon coder review may  be revised to meet current compliance requirements. Hildred Laser, MD Hildred Laser, MD 11/12/2015 12:00:11 PM This report has been signed electronically. Number of Addenda: 0

## 2015-11-12 NOTE — H&P (Signed)
Sabrina Deleon is an 65 y.o. female.   Chief Complaint: Patient is here for EGD and colonoscopy. HPI: She is 65 year old Caucasian female who presents with recent history of melena which has cleared. She was also noted to have heme-positive stool. She denies abdominal pain nausea vomiting dysphagia or rectal bleeding. She says heartburns well controlled with therapy. Last colonoscopy was in October 2004. Family history is negative for CRC. Hemoglobin was 9 g on 09/23/2015 Hemoglobin is 10.3 g on 11/03/2015.  Past Medical History:  Diagnosis Date  . Aortic stenosis   . Arthritis   . GERD (gastroesophageal reflux disease)   . Gout   . Hypertension   . Hypothyroidism   . Type 2 diabetes mellitus ([place])   . Wears glasses   . Wears partial dentures    Upper    Past Surgical History:  Procedure Laterality Date  . BACK SURGERY     Lumbar  . CARPAL TUNNEL RELEASE  11/02/2011   Procedure: CARPAL TUNNEL RELEASE;  Surgeon: Tennis Must, MD;  Location: [facility];  Service: Orthopedics;  Laterality: Right;  . CARPAL TUNNEL RELEASE Left 01/14/2015   Procedure: LEFT CARPAL TUNNEL RELEASE;  Surgeon: Leanora Cover, MD;  Location: [facility];  Service: Orthopedics;  Laterality: Left;  . CHOLECYSTECTOMY    . COLONOSCOPY      History reviewed. No pertinent family history. Social History:  reports that she quit smoking about 34 years ago. Her smoking use included Cigarettes. She has never used smokeless tobacco. She reports that she drinks alcohol. She reports that she does not use drugs.  Allergies: No Known Allergies  Medications Prior to Admission  Medication Sig Dispense Refill  . allopurinol (ZYLOPRIM) 100 MG tablet TAKE ONE TABLET TWICE A DAY 60 tablet 5  . aspirin 81 MG tablet Take 81 mg by mouth daily.    . citalopram (CELEXA) 20 MG tablet 1 1/2 tabs po qd 45 tablet 5  . COLCRYS 0.6 MG tablet take 1 tablet by mouth once daily 30 tablet 5  . ferrous sulfate  325 (65 FE) MG tablet Take 325 mg by mouth daily with breakfast.    . furosemide (LASIX) 40 MG tablet take 1 tablet once daily 30 tablet 0  . HYDROcodone-acetaminophen (NORCO) 7.5-325 MG tablet Take 1 tablet by mouth every 6 (six) hours as needed for moderate pain. 30 tablet 0  . levothyroxine (SYNTHROID, LEVOTHROID) 125 MCG tablet Take 1 tablet (125 mcg total) by mouth daily. 90 tablet 1  . lisinopril (PRINIVIL,ZESTRIL) 20 MG tablet take 1 tablet by mouth once daily 90 tablet 1  . meloxicam (MOBIC) 15 MG tablet Take 1 tablet (15 mg total) by mouth daily. 30 tablet 2  . metFORMIN (GLUCOPHAGE) 500 MG tablet take 1 tablet by mouth twice a day with food 180 tablet 1  . omeprazole (PRILOSEC) 20 MG capsule Take 20 mg by mouth daily.    . pravastatin (PRAVACHOL) 40 MG tablet take 1 tablet by mouth once daily 90 tablet 1  . zolpidem (AMBIEN) 10 MG tablet take 1 tablet by mouth at bedtime 30 tablet 4  . ketoconazole (NIZORAL) 2 % cream Apply 1 application topically 2 (two) times daily as needed for irritation. 60 g 2    Results for orders placed or performed during the hospital encounter of 11/12/15 (from the past 48 hour(s))  Glucose, capillary     Status: Abnormal   Collection Time: 11/12/15  9:51 AM  Result Value  Ref Range   Glucose-Capillary 161 (H) 65 - 99 mg/dL   No results found.  ROS  Blood pressure (!) 171/95, pulse 85, temperature 97.6 F (36.4 C), temperature source Oral, resp. rate 17, height 5\' 8"  (1.727 m), weight 262 lb (118.8 kg), SpO2 100 %. Physical Exam  Constitutional:  Well-developed obese Caucasian female in NAD.  HENT:  Mouth/Throat: Oropharynx is clear and moist.  Eyes: Conjunctivae are normal. No scleral icterus.  Neck: No thyromegaly present.  Cardiovascular: Normal rate and regular rhythm.   Murmur (grade 3/6 systolic ejection murmur heard at aortic area and left sternal border.) heard. Respiratory: Effort normal and breath sounds normal.  GI: Soft. She exhibits  no distension and no mass. There is no tenderness.  Musculoskeletal: She exhibits no edema.  Lymphadenopathy:    She has no cervical adenopathy.  Neurological: She is alert.  Skin: Skin is warm and dry.     Assessment/Plan Melena and anemia. Diagnostic EGD and colonoscopy.  Hildred Laser, MD 11/12/2015, 10:25 AM

## 2015-11-12 NOTE — Op Note (Signed)
[facility] Patient Name: Sabrina Deleon Procedure Date: 11/12/2015 10:06 AM MRN: [id number] Date of Birth: 1950/11/07 Attending MD: Hildred Laser , MD CSN: [id number] Age: 65 Admit Type: Outpatient Procedure:                Upper GI endoscopy Indications:              Acute post hemorrhagic anemia, Melena Providers:                Hildred Laser, MD, [place] B. Gwenlyn Perking RN, RN, Randa Spike, Technician Referring MD:              Medicines:                Cetacaine spray, Meperidine 75 mg IV, Midazolam 8                            mg IV Complications:            No immediate complications. Estimated Blood Loss:     Estimated blood loss was minimal. Procedure:                Pre-Anesthesia Assessment:                           - Prior to the procedure, a History and Physical                            was performed, and patient medications and                            allergies were reviewed. The patient's tolerance of                            previous anesthesia was also reviewed. The risks                            and benefits of the procedure and the sedation                            options and risks were discussed with the patient.                            All questions were answered, and informed consent                            was obtained. Prior Anticoagulants: The patient                            last took aspirin 3 days and previous NSAID                            medication 1 day prior to the procedure. ASA Grade  Assessment: II - A patient with mild systemic                            disease. After reviewing the risks and benefits,                            the patient was deemed in satisfactory condition to                            undergo the procedure.                           After obtaining informed consent, the endoscope was                            passed under direct vision. Throughout the                             procedure, the patient's blood pressure, pulse, and                            oxygen saturations were monitored continuously. The                            [id number]) scope was introduced through the                            mouth, and advanced to the second part of duodenum.                            The upper GI endoscopy was accomplished without                            difficulty. The patient tolerated the procedure                            well. Scope In: 10:43:06 AM Scope Out: 10:49:37 AM Total Procedure Duration: 0 hours 6 minutes 31 seconds  Findings:      The examined esophagus was normal.      The Z-line was irregular and was found 39 cm from the incisors.      Multiple 3 to 6 mm sessile polyps with no stigmata of recent bleeding       were found in the gastric fundus and in the gastric body. Biopsies were       taken with a cold forceps for histology.      The exam of the stomach was otherwise normal.      The duodenal bulb and second portion of the duodenum were normal. Impression:               - Normal esophagus.                           - Z-line irregular, 39 cm from the incisors.                           -  Multiple gastric polyps. Biopsied.                           - Normal duodenal bulb and second portion of the                            duodenum. Moderate Sedation:      Moderate (conscious) sedation was administered by the endoscopy nurse       and supervised by the endoscopist. The following parameters were       monitored: oxygen saturation, heart rate, blood pressure, CO2       capnography and response to care. Total physician intraservice time was       17 minutes. Recommendation:           - Patient has a contact number available for                            emergencies. The signs and symptoms of potential                            delayed complications were discussed with the                             patient. Return to normal activities tomorrow.                            Written discharge instructions were provided to the                            patient.                           - Resume previous diet today.                           - Continue present medications.                           - Await pathology results.                           - See the other procedure note for documentation of                            additional recommendations. Procedure Code(s):        --- Professional ---                           [phone], Esophagogastroduodenoscopy, flexible,                            transoral; with biopsy, single or multiple                           99152, Moderate sedation services provided by the  same physician or other qualified health care                            professional performing the diagnostic or                            therapeutic service that the sedation supports,                            requiring the presence of an independent trained                            observer to assist in the monitoring of the                            patient's level of consciousness and physiological                            status; initial 15 minutes of intraservice time,                            patient age 52 years or older Diagnosis Code(s):        --- Professional ---                           K22.8, Other specified diseases of esophagus                           K31.7, Polyp of stomach and duodenum                           D62, Acute posthemorrhagic anemia                           K92.1, Melena (includes Hematochezia) CPT copyright 2016 American Medical Association. All rights reserved. The codes documented in this report are preliminary and upon coder review may  be revised to meet current compliance requirements. Hildred Laser, MD Hildred Laser, MD 11/12/2015 11:52:48 AM This report has been signed electronically. Number of  Addenda: 0

## 2015-11-12 NOTE — Discharge Instructions (Signed)
Take meloxicam on as-needed basis rather than every day. Resume other medications and diet as before. No driving for 24 hours. Will check CBC and stool guaiac in one month. Physician will call with biopsy results.      Esophagogastroduodenoscopy, Care After Refer to this sheet in the next few weeks. These instructions provide you with information about caring for yourself after your procedure. Your health care provider may also give you more specific instructions. Your treatment has been planned according to current medical practices, but problems sometimes occur. Call your health care provider if you have any problems or questions after your procedure. WHAT TO EXPECT AFTER THE PROCEDURE After your procedure, it is typical to feel:  Soreness in your throat.  Pain with swallowing.  Sick to your stomach (nauseous).  Bloated.  Dizzy.  Fatigued. HOME CARE INSTRUCTIONS  Do not eat or drink anything until the numbing medicine (local anesthetic) has worn off and your gag reflex has returned. You will know that the local anesthetic has worn off when you can swallow comfortably.  Do not drive or operate machinery until directed by your health care provider.  Take medicines only as directed by your health care provider. SEEK MEDICAL CARE IF:   You cannot stop coughing.  You are not urinating at all or less than usual. SEEK IMMEDIATE MEDICAL CARE IF:  You have difficulty swallowing.  You cannot eat or drink.  You have worsening throat or chest pain.  You have dizziness or lightheadedness or you faint.  You have nausea or vomiting.  You have chills.  You have a fever.  You have severe abdominal pain.  You have black, tarry, or bloody stools.   This information is not intended to replace advice given to you by your health care provider. Make sure you discuss any questions you have with your health care provider.   Document Released: 02/21/2012 Document Revised:  03/27/2014 Document Reviewed: 02/21/2012 Elsevier Interactive Patient Education 2016 [employer].    Colonoscopy, Care After These instructions give you information on caring for yourself after your procedure. Your doctor may also give you more specific instructions. Call your doctor if you have any problems or questions after your procedure. HOME CARE  Do not drive for 24 hours.  Do not sign important papers or use machinery for 24 hours.  You may shower.  You may go back to your usual activities, but go slower for the first 24 hours.  Take rest breaks often during the first 24 hours.  Walk around or use warm packs on your belly (abdomen) if you have belly cramping or gas.  Drink enough fluids to keep your pee (urine) clear or pale yellow.  Resume your normal diet. Avoid heavy or fried foods.  Avoid drinking alcohol for 24 hours or as told by your doctor.  Only take medicines as told by your doctor. If a tissue sample (biopsy) was taken during the procedure:   Do not take aspirin or blood thinners for 7 days, or as told by your doctor.  Do not drink alcohol for 7 days, or as told by your doctor.  Eat soft foods for the first 24 hours. GET HELP IF: You still have a small amount of blood in your poop (stool) 2-3 days after the procedure. GET HELP RIGHT AWAY IF:  You have more than a small amount of blood in your poop.  You see clumps of tissue (blood clots) in your poop.  Your belly is puffy (swollen).  You feel sick to your stomach (nauseous) or throw up (vomit).  You have a fever.  You have belly pain that gets worse and medicine does not help. MAKE SURE YOU:  Understand these instructions.  Will watch your condition.  Will get help right away if you are not doing well or get worse.   This information is not intended to replace advice given to you by your health care provider. Make sure you discuss any questions you have with your health care provider.     Document Released: 04/08/2010 Document Revised: 03/11/2013 Document Reviewed: 11/11/2012 Elsevier Interactive Patient Education 2016 [employer].    Hemorrhoids Hemorrhoids are swollen veins around the rectum or anus. There are two types of hemorrhoids:   Internal hemorrhoids. These occur in the veins just inside the rectum. They may poke through to the outside and become irritated and painful.  External hemorrhoids. These occur in the veins outside the anus and can be felt as a painful swelling or hard lump near the anus. CAUSES  Pregnancy.   Obesity.   Constipation or diarrhea.   Straining to have a bowel movement.   Sitting for long periods on the toilet.  Heavy lifting or other activity that caused you to strain.  Anal intercourse. SYMPTOMS   Pain.   Anal itching or irritation.   Rectal bleeding.   Fecal leakage.   Anal swelling.   One or more lumps around the anus.  DIAGNOSIS  Your caregiver may be able to diagnose hemorrhoids by visual examination. Other examinations or tests that may be performed include:   Examination of the rectal area with a gloved hand (digital rectal exam).   Examination of anal canal using a small tube (scope).   A blood test if you have lost a significant amount of blood.  A test to look inside the colon (sigmoidoscopy or colonoscopy). TREATMENT Most hemorrhoids can be treated at home. However, if symptoms do not seem to be getting better or if you have a lot of rectal bleeding, your caregiver may perform a procedure to help make the hemorrhoids get smaller or remove them completely. Possible treatments include:   Placing a rubber band at the base of the hemorrhoid to cut off the circulation (rubber band ligation).   Injecting a chemical to shrink the hemorrhoid (sclerotherapy).   Using a tool to burn the hemorrhoid (infrared light therapy).   Surgically removing the hemorrhoid (hemorrhoidectomy).    Stapling the hemorrhoid to block blood flow to the tissue (hemorrhoid stapling).  HOME CARE INSTRUCTIONS   Eat foods with fiber, such as whole grains, beans, nuts, fruits, and vegetables. Ask your doctor about taking products with added fiber in them (fibersupplements).  Increase fluid intake. Drink enough water and fluids to keep your urine clear or pale yellow.   Exercise regularly.   Go to the bathroom when you have the urge to have a bowel movement. Do not wait.   Avoid straining to have bowel movements.   Keep the anal area dry and clean. Use wet toilet paper or moist towelettes after a bowel movement.   Medicated creams and suppositories may be used or applied as directed.   Only take over-the-counter or prescription medicines as directed by your caregiver.   Take warm sitz baths for 15-20 minutes, 3-4 times a day to ease pain and discomfort.   Place ice packs on the hemorrhoids if they are tender and swollen. Using ice packs between sitz baths may be helpful.  Put ice in a plastic bag.   Place a towel between your skin and the bag.   Leave the ice on for 15-20 minutes, 3-4 times a day.   Do not use a donut-shaped pillow or sit on the toilet for long periods. This increases blood pooling and pain.  SEEK MEDICAL CARE IF:  You have increasing pain and swelling that is not controlled by treatment or medicine.  You have uncontrolled bleeding.  You have difficulty or you are unable to have a bowel movement.  You have pain or inflammation outside the area of the hemorrhoids. MAKE SURE YOU:  Understand these instructions.  Will watch your condition.  Will get help right away if you are not doing well or get worse.   This information is not intended to replace advice given to you by your health care provider. Make sure you discuss any questions you have with your health care provider.   Document Released: 03/03/2000 Document Revised: 02/21/2012  Document Reviewed: 01/09/2012 Elsevier Interactive Patient Education [employer].

## 2015-11-15 ENCOUNTER — Other Ambulatory Visit (INDEPENDENT_AMBULATORY_CARE_PROVIDER_SITE_OTHER): Payer: Self-pay | Admitting: *Deleted

## 2015-11-15 ENCOUNTER — Encounter (INDEPENDENT_AMBULATORY_CARE_PROVIDER_SITE_OTHER): Payer: Self-pay | Admitting: *Deleted

## 2015-11-15 DIAGNOSIS — K921 Melena: Secondary | ICD-10-CM

## 2015-11-16 ENCOUNTER — Encounter (HOSPITAL_COMMUNITY): Payer: Self-pay | Admitting: Internal Medicine

## 2015-11-24 ENCOUNTER — Other Ambulatory Visit: Payer: Self-pay | Admitting: Family Medicine

## 2015-11-24 MED ORDER — HYDROCODONE-ACETAMINOPHEN 7.5-325 MG PO TABS: 1.0000 | ORAL_TABLET | Freq: Four times a day (QID) | ORAL | 0 refills | Status: DC | PRN

## 2015-12-17 ENCOUNTER — Other Ambulatory Visit: Payer: Self-pay | Admitting: Nurse Practitioner

## 2015-12-17 ENCOUNTER — Telehealth: Payer: Self-pay | Admitting: Nurse Practitioner

## 2015-12-17 LAB — CBC
HCT: 34.7 % — ABNORMAL LOW (ref 35.0–45.0)
Hemoglobin: 11.4 g/dL — ABNORMAL LOW (ref 11.7–15.5)
MCH: 27.3 pg (ref 27.0–33.0)
MCHC: 32.9 g/dL (ref 32.0–36.0)
MCV: 83.2 fL (ref 80.0–100.0)
MPV: 11.5 fL (ref 7.5–12.5)
Platelets: 213 K/uL (ref 140–400)
RBC: 4.17 MIL/uL (ref 3.80–5.10)
RDW: 14.7 % (ref 11.0–15.0)
WBC: 5.4 K/uL (ref 3.8–10.8)

## 2015-12-17 MED ORDER — HYDROCODONE-ACETAMINOPHEN 7.5-325 MG PO TABS: 1.0000 | ORAL_TABLET | Freq: Four times a day (QID) | ORAL | 0 refills | Status: DC | PRN

## 2015-12-17 NOTE — Telephone Encounter (Signed)
Patient requesting a refill of pain med. Has one pill left which indicates she is running out a few days early. Since this has become a regular request, patient to schedule an appointment for pain management and start our pain protocol. Also need to assess amount of pain and how well med is working.

## 2015-12-20 ENCOUNTER — Other Ambulatory Visit (INDEPENDENT_AMBULATORY_CARE_PROVIDER_SITE_OTHER): Payer: Self-pay | Admitting: *Deleted

## 2015-12-20 DIAGNOSIS — K921 Melena: Secondary | ICD-10-CM

## 2015-12-20 DIAGNOSIS — K625 Hemorrhage of anus and rectum: Secondary | ICD-10-CM

## 2015-12-26 ENCOUNTER — Other Ambulatory Visit: Payer: Self-pay | Admitting: Family Medicine

## 2015-12-29 ENCOUNTER — Other Ambulatory Visit: Payer: Self-pay | Admitting: Family Medicine

## 2016-01-13 ENCOUNTER — Encounter (INDEPENDENT_AMBULATORY_CARE_PROVIDER_SITE_OTHER): Payer: Self-pay | Admitting: *Deleted

## 2016-01-13 ENCOUNTER — Other Ambulatory Visit (INDEPENDENT_AMBULATORY_CARE_PROVIDER_SITE_OTHER): Payer: Self-pay | Admitting: *Deleted

## 2016-01-13 DIAGNOSIS — K625 Hemorrhage of anus and rectum: Secondary | ICD-10-CM

## 2016-01-13 DIAGNOSIS — K921 Melena: Secondary | ICD-10-CM

## 2016-01-14 ENCOUNTER — Other Ambulatory Visit: Payer: Self-pay | Admitting: Nurse Practitioner

## 2016-01-19 ENCOUNTER — Telehealth: Payer: Self-pay | Admitting: Family Medicine

## 2016-01-19 ENCOUNTER — Other Ambulatory Visit: Payer: Self-pay | Admitting: *Deleted

## 2016-01-19 DIAGNOSIS — E785 Hyperlipidemia, unspecified: Secondary | ICD-10-CM

## 2016-01-19 DIAGNOSIS — D509 Iron deficiency anemia, unspecified: Secondary | ICD-10-CM

## 2016-01-19 DIAGNOSIS — E119 Type 2 diabetes mellitus without complications: Secondary | ICD-10-CM

## 2016-01-19 DIAGNOSIS — Z79899 Other long term (current) drug therapy: Secondary | ICD-10-CM

## 2016-01-19 MED ORDER — HYDROCODONE-ACETAMINOPHEN 7.5-325 MG PO TABS: 1.0000 | ORAL_TABLET | Freq: Four times a day (QID) | ORAL | 0 refills | Status: DC | PRN

## 2016-01-19 NOTE — Telephone Encounter (Signed)
May have one refill 

## 2016-01-19 NOTE — Telephone Encounter (Signed)
We received a pre-operative clearance form for Sabrina Deleon from [place] for her to have surgery on 03/22/16 for Left Hip: THA w/wo autograft/allograft.  She was last seen on 10/29/15.  Will the patient need an appointment to have this completed?

## 2016-01-19 NOTE — Telephone Encounter (Signed)
Script ready for pickup. Pt notified.  

## 2016-01-19 NOTE — Telephone Encounter (Signed)
Requesting refill on hydrocodone.   °

## 2016-01-20 ENCOUNTER — Ambulatory Visit: Payer: Self-pay | Admitting: Orthopedic Surgery

## 2016-01-20 NOTE — Telephone Encounter (Signed)
Several things #1 this patient will need cardiology visit for cardiac preoperative clearance. If she needs a referral please do so. She is Arty seen Dr.Konersherwan #2 patient will need a office visit with [place] somewhere in the second half of November or the first 2 weeks of December. The patient needs to do lab work before that office visit so that we can make sure blood pressure as well as CBC etc. are optimum before surgery please do the following labs CBC, metabolic 7, lipid liver, ferritin, hemoglobin A1c-diagnosis diabetes, iron deficient anemia, hyperlipidemia #3 we can do surgical clearance at that time. The patient made progress forward with cardiology visit at any time in order to give them adequate time to do any test may need to do before surgery

## 2016-01-20 NOTE — Telephone Encounter (Signed)
Discussed with pt. Pt verbalized understanding. bw orders ready.

## 2016-01-21 ENCOUNTER — Other Ambulatory Visit (INDEPENDENT_AMBULATORY_CARE_PROVIDER_SITE_OTHER): Payer: Self-pay | Admitting: *Deleted

## 2016-01-21 ENCOUNTER — Telehealth (INDEPENDENT_AMBULATORY_CARE_PROVIDER_SITE_OTHER): Payer: Self-pay | Admitting: *Deleted

## 2016-01-21 DIAGNOSIS — D509 Iron deficiency anemia, unspecified: Secondary | ICD-10-CM | POA: Diagnosis not present

## 2016-01-21 DIAGNOSIS — K921 Melena: Secondary | ICD-10-CM | POA: Diagnosis not present

## 2016-01-21 LAB — CBC
HCT: 36.8 % (ref 35.0–45.0)
HEMOGLOBIN: 12 g/dL (ref 11.7–15.5)
MCH: 27.5 pg (ref 27.0–33.0)
MCHC: 32.6 g/dL (ref 32.0–36.0)
MCV: 84.2 fL (ref 80.0–100.0)
MPV: 10.4 fL (ref 7.5–12.5)
Platelets: 230 10*3/uL (ref 140–400)
RBC: 4.37 MIL/uL (ref 3.80–5.10)
RDW: 15.4 % — ABNORMAL HIGH (ref 11.0–15.0)
WBC: 7.7 10*3/uL (ref 3.8–10.8)

## 2016-01-21 NOTE — Telephone Encounter (Signed)
   Diagnosis:    Result(s)   Card 1: Negative:          Completed by: Wylder Macomber,LPN   HEMOCCULT SENSA DEVELOPER: [id number]   EXPIRATION DATE: 2020-5   HEMOCCULT SENSA CARD:  [id number]   EXPIRATION DATE: 03/20   CARD CONTROL RESULTS:  POSITIVE:Positive  NEGATIVE: Negative    ADDITIONAL COMMENTS: Patient was made aware of her results.

## 2016-01-24 ENCOUNTER — Other Ambulatory Visit (INDEPENDENT_AMBULATORY_CARE_PROVIDER_SITE_OTHER): Payer: Self-pay | Admitting: *Deleted

## 2016-01-24 ENCOUNTER — Ambulatory Visit: Payer: BLUE CROSS/BLUE SHIELD | Admitting: Cardiology

## 2016-01-24 DIAGNOSIS — K625 Hemorrhage of anus and rectum: Secondary | ICD-10-CM

## 2016-01-24 NOTE — Telephone Encounter (Signed)
Stool quite is negative.

## 2016-01-28 NOTE — Progress Notes (Signed)
Cardiology Office Note  Date: 02/02/2016   ID: Sabrina, Deleon 08-Dec-1950, MRN [id number]  PCP: Sabrina Lange, MD  Primary Cardiologist: Sabrina Lesches, MD   Chief Complaint  Patient presents with  . Aortic Stenosis  . Preoperative evaluation    History of Present Illness: Sabrina Deleon is a 65 y.o. female that I saw in consultation back in 2015. She has mild aortic stenosis based on echocardiogram from 2015. She is scheduled for left total hip arthroplasty with Dr. Wynelle Deleon soon. She is here today with her husband. Denies any exertional chest pain or syncope. Main limitation is related to left hip pain, she is using a walker, still working full-time.  Echocardiogram from approximately 2 years ago showed evidence of mild aortic stenosis, mean gradient was 17 mmHg. She has not had a follow-up study.  I reviewed her ECG today which shows sinus rhythm with leftward axis and low voltage.  He continues to follow with Sabrina Deleon. Medications are stable and reviewed below. She is on aspirin, ACE inhibitor and statin therapy.  Past Medical History:  Diagnosis Date  . Aortic stenosis   . Arthritis   . GERD (gastroesophageal reflux disease)   . Gout   . Hypertension   . Hypothyroidism   . Type 2 diabetes mellitus ([place])   . Wears glasses   . Wears partial dentures    Upper    Past Surgical History:  Procedure Laterality Date  . BACK SURGERY     Lumbar  . CARPAL TUNNEL RELEASE  11/02/2011   Procedure: CARPAL TUNNEL RELEASE;  Surgeon: Tennis Must, MD;  Location: [facility];  Service: Orthopedics;  Laterality: Right;  . CARPAL TUNNEL RELEASE Left 01/14/2015   Procedure: LEFT CARPAL TUNNEL RELEASE;  Surgeon: Leanora Cover, MD;  Location: [facility];  Service: Orthopedics;  Laterality: Left;  . CHOLECYSTECTOMY    . COLONOSCOPY    . COLONOSCOPY N/A 11/12/2015   Procedure: COLONOSCOPY;  Surgeon: Rogene Houston, MD;  [facility];  Service:  Endoscopy;  Laterality: N/A;  9:30  . ESOPHAGOGASTRODUODENOSCOPY N/A 11/12/2015   Procedure: ESOPHAGOGASTRODUODENOSCOPY (EGD);  Surgeon: Rogene Houston, MD;  [facility];  Service: Endoscopy;  Laterality: N/A;    Current Outpatient Prescriptions  Medication Sig Dispense Refill  . allopurinol (ZYLOPRIM) 100 MG tablet TAKE ONE TABLET TWICE A DAY 60 tablet 5  . aspirin 81 MG tablet Take 81 mg by mouth daily.    . citalopram (CELEXA) 20 MG tablet 1 1/2 tabs po qd 45 tablet 5  . COLCRYS 0.6 MG tablet take 1 tablet by mouth once daily 90 tablet 1  . ferrous sulfate 325 (65 FE) MG tablet Take 325 mg by mouth daily with breakfast.    . furosemide (LASIX) 40 MG tablet take 1 tablet once daily 30 tablet 0  . HYDROcodone-acetaminophen (NORCO) 7.5-325 MG tablet Take 1 tablet by mouth every 6 (six) hours as needed for moderate pain. 30 tablet 0  . ketoconazole (NIZORAL) 2 % cream Apply 1 application topically 2 (two) times daily as needed for irritation. 60 g 2  . levothyroxine (SYNTHROID, LEVOTHROID) 125 MCG tablet Take 1 tablet (125 mcg total) by mouth daily. 90 tablet 1  . lisinopril (PRINIVIL,ZESTRIL) 20 MG tablet take 1 tablet by mouth once daily 90 tablet 1  . meloxicam (MOBIC) 15 MG tablet take 1 tablet by mouth once daily 30 tablet 2  . metFORMIN (GLUCOPHAGE) 500 MG  tablet take 1 tablet by mouth twice a day with food 180 tablet 1  . omeprazole (PRILOSEC) 20 MG capsule Take 20 mg by mouth daily.    . pravastatin (PRAVACHOL) 40 MG tablet take 1 tablet by mouth once daily 90 tablet 1  . zolpidem (AMBIEN) 10 MG tablet take 1 tablet by mouth at bedtime 30 tablet 4   No current facility-administered medications for this visit.    Allergies:  Patient has no known allergies.   Social History: The patient  reports that she quit smoking about 34 years ago. Her smoking use included Cigarettes. She has never used smokeless tobacco. She reports that she drinks alcohol. She reports that she does  not use drugs.   ROS:  Please see the history of present illness. Otherwise, complete review of systems is positive for left hip pain.  All other systems are reviewed and negative.   Physical Exam: VS:  BP 125/78   Pulse 65   Ht 5\' 9"  (1.753 m)   Wt 248 lb (112.5 kg)   SpO2 100%   BMI 36.62 kg/m , BMI Body mass index is 36.62 kg/m.  Wt Readings from Last 3 Encounters:  02/02/16 248 lb (112.5 kg)  11/12/15 262 lb (118.8 kg)  10/29/15 262 lb 2 oz (118.9 kg)    General: Patient appears comfortable at rest. HEENT: Conjunctiva and lids normal, oropharynx clear. Neck: Supple, no elevated JVP or carotid bruits, no thyromegaly. Lungs: Clear to auscultation, nonlabored breathing at rest. Cardiac: Regular rate and rhythm, no S3, 3/6 systolic murmur, no pericardial rub. Abdomen: Soft, nontender, bowel sounds present, no guarding or rebound. Extremities: No pitting edema, distal pulses 2+. Skin: Warm and dry. Musculoskeletal: No kyphosis. Neuropsychiatric: Alert and oriented x3, affect grossly appropriate.  ECG: I personally reviewed the tracing from 01/08/2015 which showed normal sinus rhythm.  Recent Labwork: 04/23/2015: Magnesium 2.0 09/18/2015: ALT 17; AST 32; TSH 5.210 10/25/2015: BUN 30; Creatinine, Ser 1.61; Potassium 4.8; Sodium 140 01/21/2016: Hemoglobin 12.0; Platelets 230     Component Value Date/Time   CHOL 157 09/18/2015 0934   TRIG 135 09/18/2015 0934   HDL 41 09/18/2015 0934   CHOLHDL 3.8 09/18/2015 0934   LDLCALC 89 09/18/2015 0934    Other Studies Reviewed Today:  Echocardiogram 05/16/2013: Study Conclusions  - Procedure narrative: Transthoracic echocardiography. Image quality was suboptimal. The study was technically difficult, as a result of poor sound wave transmission and body habitus. - Left ventricle: The cavity size was normal. Wall thickness was increased in a pattern of severe LVH. Systolic function was normal. The estimated ejection fraction  was in the range of 60% to 65%. Wall motion was normal; there were no regional wall motion abnormalities. There was an increased relative contribution of atrial contraction to ventricular filling. Doppler parameters are consistent with abnormal left ventricular relaxation (grade 1 diastolic dysfunction). - Aortic valve: Mild aortic stenosis is seen. Mildly calcified annulus. Mildly thickened leaflets. Peak velocity: 289cm/s (S). Mean gradient: 43mm Hg (S). - Mitral valve: Calcified annulus. - Left atrium: The atrium was mildly dilated.  Assessment and Plan:  1. Preoperative evaluation of a 65 year old woman with history of aortic stenosis graded as mild and 2015, essential hypertension, anti-2 diabetes mellitus. I reviewed her ECG, she does not report any angina symptoms or progressive functional decline other than that related to progressive left hip pain. She does have a fairly prominent cardiac murmur and we will obtain a follow-up echocardiogram to ensure no major progression in aortic  stenosis. Further recognitions to follow.  2. Essential hypertension, blood pressure is well controlled today.  3. Hyperlipidemia, on Pravachol with interval LDL 89.  4. Aortic stenosis, mild in 2015 with mean gradient 17 mmHg.  Current medicines were reviewed with the patient today.   Orders Placed This Encounter  Procedures  . EKG 12-Lead  . ECHOCARDIOGRAM COMPLETE    Disposition: Call with results. Routine follow-up in one year.  Signed, Satira Sark, MD, [facility] 02/02/2016 4:10 PM    [facility] [place], [place], [place] [address] Phone: [phone]; Fax: [phone]

## 2016-02-02 ENCOUNTER — Ambulatory Visit (INDEPENDENT_AMBULATORY_CARE_PROVIDER_SITE_OTHER): Payer: BLUE CROSS/BLUE SHIELD | Admitting: Cardiology

## 2016-02-02 ENCOUNTER — Ambulatory Visit: Payer: BLUE CROSS/BLUE SHIELD | Admitting: Cardiology

## 2016-02-02 ENCOUNTER — Encounter: Payer: Self-pay | Admitting: *Deleted

## 2016-02-02 VITALS — BP 125/78 | HR 65 | Ht 69.0 in | Wt 248.0 lb

## 2016-02-02 DIAGNOSIS — I350 Nonrheumatic aortic (valve) stenosis: Secondary | ICD-10-CM

## 2016-02-02 DIAGNOSIS — Z01810 Encounter for preprocedural cardiovascular examination: Secondary | ICD-10-CM | POA: Diagnosis not present

## 2016-02-02 DIAGNOSIS — E782 Mixed hyperlipidemia: Secondary | ICD-10-CM

## 2016-02-02 NOTE — Patient Instructions (Signed)
Your physician wants you to follow-up in: 1 YEAR WITH DR MCDOWELL You will receive a reminder letter in the mail two months in advance. If you don't receive a letter, please call our office to schedule the follow-up appointment.  Your physician recommends that you continue on your current medications as directed. Please refer to the Current Medication list given to you today.  Your physician has requested that you have an echocardiogram. Echocardiography is a painless test that uses sound waves to create images of your heart. It provides your doctor with information about the size and shape of your heart and how well your heart's chambers and valves are working. This procedure takes approximately one hour. There are no restrictions for this procedure.  Thank you for choosing [facility]!!    

## 2016-02-17 ENCOUNTER — Other Ambulatory Visit: Payer: Self-pay

## 2016-02-17 ENCOUNTER — Ambulatory Visit (INDEPENDENT_AMBULATORY_CARE_PROVIDER_SITE_OTHER): Payer: BLUE CROSS/BLUE SHIELD

## 2016-02-17 DIAGNOSIS — I350 Nonrheumatic aortic (valve) stenosis: Secondary | ICD-10-CM | POA: Diagnosis not present

## 2016-02-17 DIAGNOSIS — Z01810 Encounter for preprocedural cardiovascular examination: Secondary | ICD-10-CM | POA: Diagnosis not present

## 2016-02-17 LAB — ECHOCARDIOGRAM COMPLETE
AO mean calculated velocity dopler: 241 cm/s
AOPV: 0.35 m/s
AOVTI: 84.2 cm
AV Area VTI index: 0.49 cm2/m2
AV Area VTI: 1.11 cm2
AV Mean grad: 27 mmHg
AV Peak grad: 44 mmHg
AV peak Index: 0.49
AV pk vel: 331 cm/s
AVAREAMEANV: 1.03 cm2
AVAREAMEANVIN: 0.46 cm2/m2
AVCELMEANRAT: 0.33
AVLVOTPG: 5 mmHg
CHL CUP AV VALUE AREA INDEX: 0.49
CHL CUP AV VEL: 1.11
CHL CUP DOP CALC LVOT VTI: 29.8 cm
CHL CUP MV DEC (S): 225
CHL CUP RV SYS PRESS: 13 mmHg
CHL CUP STROKE VOLUME: 45 mL
E decel time: 225 msec
E/e' ratio: 14.87
FS: 32 % (ref 28–44)
IVS/LV PW RATIO, ED: 0.79
LA ID, A-P, ES: 36 mm
LA vol: 57.7 mL
LADIAMINDEX: 1.59 cm/m2
LAVOLA4C: 50 mL
LAVOLIN: 25.5 mL/m2
LDCA: 3.14 cm2
LEFT ATRIUM END SYS DIAM: 36 mm
LV E/e' medial: 14.87
LV E/e'average: 14.87
LV SIMPSON'S DISK: 71
LV TDI E'MEDIAL: 6.27
LV dias vol index: 28 mL/m2
LV dias vol: 64 mL (ref 46–106)
LVELAT: 8.07 cm/s
LVOT SV: 94 mL
LVOT diameter: 20 mm
LVOTPV: 117 cm/s
LVOTVTI: 0.35 cm
LVSYSVOL: 19 mL (ref 14–42)
LVSYSVOLIN: 8 mL/m2
MVPG: 6 mmHg
MVPKAVEL: 159 m/s
MVPKEVEL: 120 m/s
PW: 13.9 mm — AB (ref 0.6–1.1)
Reg peak vel: 156 cm/s
TAPSE: 24.9 mm
TDI e' lateral: 8.07
TRMAXVEL: 156 cm/s
Valve area: 1.11 cm2

## 2016-02-20 ENCOUNTER — Other Ambulatory Visit: Payer: Self-pay | Admitting: Nurse Practitioner

## 2016-02-20 ENCOUNTER — Other Ambulatory Visit: Payer: Self-pay | Admitting: Family Medicine

## 2016-02-21 ENCOUNTER — Encounter: Payer: Self-pay | Admitting: *Deleted

## 2016-02-21 NOTE — Telephone Encounter (Signed)
May refill this time 6

## 2016-02-21 NOTE — Telephone Encounter (Signed)
The patient may have 6 refills on allopurinol. As for furosemide please talk with patient to confirm that she is taking this I do not trust the Epic list. If she is taking this she may have 6 refills

## 2016-02-25 ENCOUNTER — Telehealth: Payer: Self-pay | Admitting: Family Medicine

## 2016-02-25 ENCOUNTER — Ambulatory Visit (INDEPENDENT_AMBULATORY_CARE_PROVIDER_SITE_OTHER): Payer: BLUE CROSS/BLUE SHIELD | Admitting: Family Medicine

## 2016-02-25 ENCOUNTER — Encounter: Payer: Self-pay | Admitting: Family Medicine

## 2016-02-25 VITALS — BP 138/74 | Ht 69.0 in | Wt 243.0 lb

## 2016-02-25 DIAGNOSIS — I350 Nonrheumatic aortic (valve) stenosis: Secondary | ICD-10-CM | POA: Diagnosis not present

## 2016-02-25 DIAGNOSIS — I519 Heart disease, unspecified: Secondary | ICD-10-CM

## 2016-02-25 DIAGNOSIS — N1832 Chronic kidney disease, stage 3b: Secondary | ICD-10-CM

## 2016-02-25 DIAGNOSIS — I10 Essential (primary) hypertension: Secondary | ICD-10-CM | POA: Diagnosis not present

## 2016-02-25 DIAGNOSIS — N183 Chronic kidney disease, stage 3 (moderate): Secondary | ICD-10-CM

## 2016-02-25 DIAGNOSIS — E1122 Type 2 diabetes mellitus with diabetic chronic kidney disease: Secondary | ICD-10-CM | POA: Diagnosis not present

## 2016-02-25 LAB — CBC WITH DIFFERENTIAL/PLATELET
Basophils Absolute: 0 10*3/uL (ref 0.0–0.2)
Basos: 0 %
EOS (ABSOLUTE): 0.6 10*3/uL — AB (ref 0.0–0.4)
Eos: 9 %
Hematocrit: 34.9 % (ref 34.0–46.6)
Hemoglobin: 11.4 g/dL (ref 11.1–15.9)
Immature Grans (Abs): 0 10*3/uL (ref 0.0–0.1)
Immature Granulocytes: 0 %
LYMPHS ABS: 1.5 10*3/uL (ref 0.7–3.1)
LYMPHS: 25 %
MCH: 28.1 pg (ref 26.6–33.0)
MCHC: 32.7 g/dL (ref 31.5–35.7)
MCV: 86 fL (ref 79–97)
MONOS ABS: 0.4 10*3/uL (ref 0.1–0.9)
Monocytes: 7 %
NEUTROS ABS: 3.6 10*3/uL (ref 1.4–7.0)
Neutrophils: 59 %
PLATELETS: 207 10*3/uL (ref 150–379)
RBC: 4.05 x10E6/uL (ref 3.77–5.28)
RDW: 16.5 % — AB (ref 12.3–15.4)
WBC: 6.1 10*3/uL (ref 3.4–10.8)

## 2016-02-25 LAB — LIPID PANEL
Chol/HDL Ratio: 3.9 ratio units (ref 0.0–4.4)
Cholesterol, Total: 156 mg/dL (ref 100–199)
HDL: 40 mg/dL (ref >39)
LDL Calculated: 85 mg/dL (ref 0–99)
Triglycerides: 154 mg/dL — ABNORMAL HIGH (ref 0–149)
VLDL Cholesterol Cal: 31 mg/dL (ref 5–40)

## 2016-02-25 LAB — HEPATIC FUNCTION PANEL
ALBUMIN: 4.5 g/dL (ref 3.6–4.8)
ALK PHOS: 68 IU/L (ref 39–117)
ALT: 14 IU/L (ref 0–32)
AST: 30 IU/L (ref 0–40)
BILIRUBIN TOTAL: 0.6 mg/dL (ref 0.0–1.2)
Bilirubin, Direct: 0.17 mg/dL (ref 0.00–0.40)
TOTAL PROTEIN: 6.9 g/dL (ref 6.0–8.5)

## 2016-02-25 LAB — HEMOGLOBIN A1C
ESTIMATED AVERAGE GLUCOSE: 154 mg/dL
Hgb A1c MFr Bld: 7 % — ABNORMAL HIGH (ref 4.8–5.6)

## 2016-02-25 LAB — BASIC METABOLIC PANEL
BUN / CREAT RATIO: 20 (ref 12–28)
BUN: 32 mg/dL — ABNORMAL HIGH (ref 8–27)
CHLORIDE: 96 mmol/L (ref 96–106)
CO2: 26 mmol/L (ref 18–29)
Calcium: 9.9 mg/dL (ref 8.7–10.3)
Creatinine, Ser: 1.64 mg/dL — ABNORMAL HIGH (ref 0.57–1.00)
GFR calc Af Amer: 38 mL/min/{1.73_m2} — ABNORMAL LOW (ref >59)
GFR calc non Af Amer: 33 mL/min/{1.73_m2} — ABNORMAL LOW (ref >59)
Glucose: 143 mg/dL — ABNORMAL HIGH (ref 65–99)
Potassium: 5.1 mmol/L (ref 3.5–5.2)
SODIUM: 138 mmol/L (ref 134–144)

## 2016-02-25 LAB — FERRITIN: Ferritin: 133 ng/mL (ref 15–150)

## 2016-02-25 MED ORDER — LINAGLIPTIN 5 MG PO TABS: 5.0000 mg | ORAL_TABLET | Freq: Every day | ORAL | 0 refills | Status: DC

## 2016-02-25 MED ORDER — HYDROCODONE-ACETAMINOPHEN 7.5-325 MG PO TABS: 1.0000 | ORAL_TABLET | ORAL | 0 refills | Status: DC | PRN

## 2016-02-25 MED ORDER — FUROSEMIDE 40 MG PO TABS: 40.0000 mg | ORAL_TABLET | Freq: Every day | ORAL | 5 refills | Status: DC

## 2016-02-25 NOTE — Telephone Encounter (Signed)
Spoke with patient and informed her per Dr.Scott Luking-She may stay on the metformin until the other medication is approved. She may come by and pick up a prescription on her pain medicine a new prescription was printed-this can be filled today-if she starts to run low she needs to let me know we will give her an additional prescription- stop meloxicam. Patient verbalized understanding.

## 2016-02-25 NOTE — Telephone Encounter (Signed)
Patient says she was told to stop taking her Metformin and her Glipizide today by Dr. Nicki Reaper.  She was put on a new medication linagliptin, but the pharmacy told her that it needs prior authorization.  Because that can take a few days, she is wanting to know if she should continue taking her Metformin until she can get that medication filled?  Also, she said that Dr. Nicki Reaper was going to increase the dosage on her hydrocodone, but she didn't get the Rx today and the pharmacy is telling her its too early to fill.  Please advise.

## 2016-02-25 NOTE — Telephone Encounter (Signed)
She may stay on the metformin until the other medication is approved. She may come by and pick up a prescription on her pain medicine a new prescription was printed-this can be filled today-if she starts to run low she needs to let me know we will give her an additional prescription- stop melozicam

## 2016-02-25 NOTE — Patient Instructions (Signed)
Start tradjenta 5mg  one daily. Stop metformin Stop mobic Use hyrdrocodone for pain

## 2016-02-25 NOTE — Progress Notes (Signed)
   Subjective:    Patient ID: Sabrina Deleon, female    DOB: 03-29-1950, 65 y.o.   MRN: [id number]  HPI pt arrives for surgical clearance. Having hip replacement on jan 3rd. bloodwork done yesterday.  This patient has severe osteoarthritis of her left hip she is scheduled to have an hip replacement coming up in January. In addition to this she has multiple health problems She is followed for diabetes as well as hypertension and obesity and hyperlipidemia. Patient does have uses pain medicine help her hip she uses it sparingly She has been using anti-inflammatory as well as metformin Over the past year her creatinine has been rising. She does state her sugars have been mildly elevated. Review of Systems She relates hip pain she denies chest pain or shortness of breath she denies numbness in her feet denies nausea vomiting diarrhea fever chills    Objective:   Physical Exam Neck no masses lungs are clear no crackles heart with murmur regular abdomen soft obese left hip subjective pain and discomfort  Her labs were reviewed with her. Hemoglobin A1c is 7.0 hemoglobin is good at 11.4 creatinine is mildly mildly elevated Liver functions are good       Assessment & Plan:  The patient is medically cleared for surgery. She does have some risk factors for this. But I do believe she will do well  Patient has been seen by cardiology and cleared. She does have some aortic stenosis but they felt that this was manageable  Patient was advised to be off of all anti-inflammatories 1 week before surgery. No aspirin one week before surgery.  Patient was advised to stop Mobic  Renal insufficiency patient was advised to stop metformin. She has low creatinine clearance I recommend Tradjenta 5 mg daily The patient will repeat metabolic 7 in approximately 2-3 weeks  Diabetes fair control  Blood pressure decent control  Significant obesity patient working to try to reduce weight  Hyperlipidemia lipid  profiles reviewed continue current measures  We will discuss case with nephrology regarding creatinine also allopurinol use and lisinopril use.

## 2016-02-25 NOTE — Telephone Encounter (Signed)
Scott, I am walking into the middle of this. Please advise. Thanks.

## 2016-02-29 ENCOUNTER — Encounter: Payer: Self-pay | Admitting: Family Medicine

## 2016-03-02 ENCOUNTER — Telehealth: Payer: Self-pay

## 2016-03-02 ENCOUNTER — Ambulatory Visit: Payer: Self-pay | Admitting: Orthopedic Surgery

## 2016-03-02 ENCOUNTER — Telehealth: Payer: Self-pay | Admitting: Family Medicine

## 2016-03-02 DIAGNOSIS — Z79899 Other long term (current) drug therapy: Secondary | ICD-10-CM

## 2016-03-02 NOTE — Telephone Encounter (Signed)
Patient's Sabrina Deleon was denied. Please see letter in folder in your office.

## 2016-03-02 NOTE — H&P (Signed)
Marland Kitchen DOB: 1950-11-11 Married / Language: English / Race: White Female Date of Admission:  03/22/2016 CC: Left Hip Pain History of Present Illness  The patient is a 65 year old female who comes in for a preoperative History and Physical. The patient is scheduled for a left total hip arthroplasty (anterior appraoch) to be performed by Dr. Dione Plover. Aluisio, MD at [facility] on 03/22/2016 . The patient is a 65 year old female who presented for follow up of their hip. The patient is being followed for their left hip pain and osteoarthritis. Symptoms reported include: pain, aching, stiffness, grinding, difficulty ambulating and difficulty arising from chair. The patient feels that they are doing poorly and report their pain level to be severe. The following medication has been used for pain control: antiinflammatory medication (meloxicam) and Hydrocodone (7.5; takes 1/2 tab, mainly at night). Chryl has had problems with this hip for about the year or two, but much worse over the past six months. She is at a stage where the hip is hurting her all the time. It is limiting what she can and cannot do. She has activity pain as well as night pain. She cannot do her activities of daily living. AP pelvis and AP and lateral of the left hip from few months ago, showed she is bone non bone in the hip with essentially zero joint space left. She has got advanced end stage arthritis, left hip with progressive pain and dysfunction. At this point, the most predictable means of improving her pain and function is total hip arthroplasty. They have been treated conservatively in the past for the above stated problem and despite conservative measures, they continue to have progressive pain and severe functional limitations and dysfunction. They have failed non-operative management including home exercise, medications. It is felt that they would benefit from undergoing total joint replacement. Risks and benefits of the  procedure have been discussed with the patient and they elect to proceed with surgery. There are no active contraindications to surgery such as ongoing infection or rapidly progressive neurological disease.   Problem List/Past Medical  Primary osteoarthritis of left hip (M16.12)  Aortic stenosis, moderate (I35.0) (recent ECHO as per patient) Anemia  Anxiety Disorder  Diabetes Mellitus, Type II  Hiatal Hernia  Gastroesophageal Reflux Disease  Gout  Heart murmur  High blood pressure  Hypercholesterolemia  Osteoarthritis  Hemorrhoids  Irritable bowel syndrome  Pancreatitis  Past History Urinary Incontinence   Allergies No Known Drug Allergies   Family History Cancer  Maternal Grandmother, Mother. Diabetes Mellitus  Maternal Grandmother. Hypertension  Father. Osteoarthritis  Mother. Severe allergy  Mother.  Social History Children  1 Current drinker  12/22/2015: Currently drinks beer only occasionally per week Current work status  working full time Exercise  Exercises rarely; does other Living situation  live with spouse Marital status  married No history of drug/alcohol rehab  Not under pain contract  Tobacco / smoke exposure  12/22/2015: yes  Medication History Vitamin D Active. Omeprazole (20MG  Tablet DR, Oral) Active. Aspirin (81MG  Tablet, 1 (one) Oral) Active. Ketoconazole (2% Cream, External) Active. Citalopram Hydrobromide (20MG  Tablet, Oral) Active. Levothyroxine Sodium (112MCG Tablet, Oral) Active. Ferrous Sulfate Active. Zolpidem Tartrate (10MG  Tab Sublingual, Sublingual) Active. Pravastatin Sodium (40MG  Tablet, Oral) Active. MetFORMIN HCl (500MG  Tablet, Oral) Active. Colcrys (0.6MG  Tablet, Oral) Active. Lisinopril (20MG  Tablet, Oral) Active. Meloxicam (15MG  Tablet, Oral) Active. Hydrocodone-Acetaminophen (7.5-325MG  Tablet, Oral) Active. Furosemide (40MG  Tablet, Oral) Active. Allopurinol (100MG  Tablet, Oral)  Active.  Past Surgical History  Carpal Tunnel Repair  bilateral Dilation and Curettage of Uterus  Gallbladder Surgery  laporoscopic Back Surgery  Date: 54.   Review of Systems General Not Present- Chills, Fatigue, Fever, Memory Loss, Night Sweats, Weight Gain and Weight Loss. Skin Not Present- Eczema, Hives, Itching, Lesions and Rash. HEENT Present- Tinnitus. Not Present- Dentures, Double Vision, Headache, Hearing Loss and Visual Loss. Respiratory Not Present- Allergies, Chronic Cough, Coughing up blood, Shortness of breath at rest and Shortness of breath with exertion. Cardiovascular Present- Murmur. Not Present- Chest Pain, Difficulty Breathing Lying Down, Palpitations, Racing/skipping heartbeats and Swelling. Gastrointestinal Not Present- Abdominal Pain, Bloody Stool, Constipation, Diarrhea, Difficulty Swallowing, Heartburn, Jaundice, Loss of appetitie, Nausea and Vomiting. Female Genitourinary Not Present- Blood in Urine, Discharge, Flank Pain, Incontinence, Painful Urination, Urgency, Urinary frequency, Urinary Retention, Urinating at Night and Weak urinary stream. Musculoskeletal Present- Back Pain, Joint Pain and Morning Stiffness. Not Present- Joint Swelling, Muscle Pain, Muscle Weakness and Spasms. Neurological Not Present- Blackout spells, Difficulty with balance, Dizziness, Paralysis, Tremor and Weakness. Psychiatric Not Present- Insomnia.  Vitals  Weight: 239 lb Height: 68in Weight was reported by patient. Height was reported by patient. Body Surface Area: 2.2 m Body Mass Index: 36.34 kg/m  Pulse: 72 (Regular)  BP: 126/78 (Sitting, Left Arm, Standard)   Physical Exam General Mental Status -Alert, cooperative and good historian. General Appearance-pleasant, Not in acute distress. Orientation-Oriented X3. Build & Nutrition-Well nourished and Well developed.  Head and Neck Head-normocephalic, atraumatic . Neck Global Assessment - supple, no  bruit auscultated on the right, no bruit auscultated on the left.  Eye Pupil - Bilateral-Regular and Round. Motion - Bilateral-EOMI.  Chest and Lung Exam Auscultation Breath sounds - clear at anterior chest wall and clear at posterior chest wall. Adventitious sounds - No Adventitious sounds.  Cardiovascular Auscultation Rhythm - Regular rate and rhythm. Heart Sounds - S1 WNL and S2 WNL. Murmurs & Other Heart Sounds: Murmur 1 - Location - Aortic Area, Pulmonic Area and Sternal Border - Left. Timing - Mid-systolic. Grade - III/VI. Character - Crescendo/Decrescendo. Radiation - Carotids(both carotid arteries).  Abdomen Palpation/Percussion Tenderness - Abdomen is non-tender to palpation. Rigidity (guarding) - Abdomen is soft. Auscultation Auscultation of the abdomen reveals - Bowel sounds normal. Abdominal Bruit - Epigastrium(very faint, likely referred aortic stenotic murmur).  Female Genitourinary Note: Not done, not pertinent to present illness   Musculoskeletal Note: On exam, well-developed female, in no distress. Her left hip can be flexed to 90, no rotation internally, minimal rotation externally and only about 20 degrees abduction. Right hip has normal range of motion. She has significantly antalgic gait pattern.  IMAGING AP pelvis and AP and lateral of the left hip from few months ago, showed she is bone non bone in the hip with essentially zero joint space left.  Assessment & Plan  Primary osteoarthritis of left hip (M16.12)  Note:Surgical Plans: Left Total Hip Replacement - Anterior Approach  Disposition: Home  PCP: Dr. Sallee Lange - Patient has been seen preoperatively and felt to be stable for surgery.  IV TXA  Anesthesia Issues: None  Signed electronically by Ok Edwards, III PA-C

## 2016-03-02 NOTE — Telephone Encounter (Signed)
Please let the patient know that her insurance will not pay for Tradjenta but will a  For Onglyza this medication can be used with patients who have renal impairment but had a dose of 2.5 mg 1 daily. Discontinued Tradjenta order. May order Onglyza 2.5 mg daily-#30, 3 refills, it is necessary to recheck metabolic 7 in 2 weeks time. This is to make sure that her kidneys are getting along with this medicine.

## 2016-03-02 NOTE — Telephone Encounter (Signed)
I did research regarding medications. Onglyza is a medication that can be use with renal impairment at a dose of 2.5 mg daily. Periodic metabolic sevens will be necessary. See previous message.

## 2016-03-03 MED ORDER — SAXAGLIPTIN HCL 2.5 MG PO TABS: 2.5000 mg | ORAL_TABLET | Freq: Every day | ORAL | 3 refills | Status: DC

## 2016-03-03 NOTE — Telephone Encounter (Signed)
Notified patient that her insurance will not pay for Tradjenta but will pay for Onglyza this medication can be used with patients who have renal impairment but had a dose of 2.5 mg 1 daily. Discontinued Tradjenta order. May order Onglyza 2.5 mg daily-#30, 3 refills, it is necessary to recheck metabolic 7 in 2 weeks time. This is to make sure that her kidneys are getting along with this medicine. Patient verbalized understanding. Med sent to pharmacy. Bloodwork ordered.

## 2016-03-10 ENCOUNTER — Telehealth: Payer: Self-pay | Admitting: Nurse Practitioner

## 2016-03-10 ENCOUNTER — Other Ambulatory Visit: Payer: Self-pay | Admitting: Nurse Practitioner

## 2016-03-10 MED ORDER — AMOXICILLIN-POT CLAVULANATE 875-125 MG PO TABS
1.0000 | ORAL_TABLET | Freq: Two times a day (BID) | ORAL | 0 refills | Status: DC
Start: 2016-03-10 — End: 2016-03-24

## 2016-03-10 NOTE — Telephone Encounter (Signed)
Pt notified on voicemail  °

## 2016-03-10 NOTE — Telephone Encounter (Signed)
Patient has a sinus infection, with green drainage and blood.  She was seen 02/25/16 with Dr. Nicki Reaper.  Can we sent in antibiotic?   Sabrina Deleon

## 2016-03-10 NOTE — Telephone Encounter (Signed)
Antibiotic called in. Office visit if no improvement.

## 2016-03-14 NOTE — Patient Instructions (Addendum)
Sabrina Deleon  03/14/2016   Your procedure is scheduled on: 03-22-16  Report to [facility] Main  Entrance take [facility]  elevators to 3rd floor to  [place] at   1030 AM.  Call this number if you have problems the morning of surgery [phone]   Remember: ONLY 1 PERSON MAY GO WITH YOU TO SHORT STAY TO GET  READY MORNING OF [place].  Do not eat food or drink liquids :After Midnight.Exception, may have Clear Liquids 12 midnight to 0700 AM, then nothing.   CLEAR LIQUID DIET   Foods Allowed                                                                     NOT Allowed  Coffee and tea, regular and decaf                             liquids that you cannot  Plain Jell-O in any flavor                                             see through such as: Fruit ices (not with fruit pulp)                                     milk, soups, orange juice  Iced Popsicles                                    All solid food Carbonated beverages, regular and diet                                    Cranberry, grape and apple juices Sports drinks like Gatorade Lightly seasoned clear broth or consume(fat free) Sugar, honey syrup _____________________________________________________________________       Take these medicines the morning of surgery with A SIP OF WATER: Allopurinol. Citalopram. Colcrys. Levothyroxine. Omeprazole. Hydrocodone. Omeprazole.Complete Augmentin as prescribed. DO NOT TAKE ANY DIABETIC MEDICATIONS DAY OF YOUR SURGERY                               You may not have any metal on your body including hair pins and              piercings  Do not wear jewelry, make-up, lotions, powders or perfumes, deodorant             Do not wear nail polish.  Do not shave  48 hours prior to surgery.              Men may shave face and neck.   Do not bring valuables to the hospital. [facility] IS NOT  RESPONSIBLE   FOR VALUABLES.  Contacts, dentures or  bridgework may not be worn into surgery.  Leave suitcase in the car. After surgery it may be brought to your room.     Patients discharged the day of surgery will not be allowed to drive home.  Name and phone number of your driver:[name] [phone] cell  Special Instructions: N/A              Please read over the following fact sheets you were given: _____________________________________________________________________             [facility] - Preparing for Surgery Before surgery, you can play an important role.  Because skin is not sterile, your skin needs to be as free of germs as possible.  You can reduce the number of germs on your skin by washing with CHG (chlorahexidine gluconate) soap before surgery.  CHG is an antiseptic cleaner which kills germs and bonds with the skin to continue killing germs even after washing. Please DO NOT use if you have an allergy to CHG or antibacterial soaps.  If your skin becomes reddened/irritated stop using the CHG and inform your nurse when you arrive at Short Stay. Do not shave (including legs and underarms) for at least 48 hours prior to the first CHG shower.  You may shave your face/neck. Please follow these instructions carefully:  1.  Shower with CHG Soap the night before surgery and the  morning of Surgery.  2.  If you choose to wash your hair, wash your hair first as usual with your  normal  shampoo.  3.  After you shampoo, rinse your hair and body thoroughly to remove the  shampoo.                           4.  Use CHG as you would any other liquid soap.  You can apply chg directly  to the skin and wash                       Gently with a scrungie or clean washcloth.  5.  Apply the CHG Soap to your body ONLY FROM THE NECK DOWN.   Do not use on face/ open                           Wound or open sores. Avoid contact with eyes, ears mouth and genitals (private parts).                       Wash face,  Genitals (private parts) with your  normal soap.             6.  Wash thoroughly, paying special attention to the area where your surgery  will be performed.  7.  Thoroughly rinse your body with warm water from the neck down.  8.  DO NOT shower/wash with your normal soap after using and rinsing off  the CHG Soap.                9.  Pat yourself dry with a clean towel.            10.  Wear clean pajamas.            11.  Place clean sheets on your bed the night of your first shower and do not  sleep with pets. Day  of Surgery : Do not apply any lotions/deodorants the morning of surgery.  Please wear clean clothes to the hospital/surgery center.  FAILURE TO FOLLOW THESE INSTRUCTIONS MAY RESULT IN THE CANCELLATION OF YOUR SURGERY   How to Manage Your Diabetes Before and After Surgery  Why is it important to control my blood sugar before and after surgery? . Improving blood sugar levels before and after surgery helps healing and can limit problems. . A way of improving blood sugar control is eating a healthy diet by: o  Eating less sugar and carbohydrates o  Increasing activity/exercise o  Talking with your doctor about reaching your blood sugar goals . High blood sugars (greater than 180 mg/dL) can raise your risk of infections and slow your recovery, so you will need to focus on controlling your diabetes during the weeks before surgery. . Make sure that the doctor who takes care of your diabetes knows about your planned surgery including the date and location.  How do I manage my blood sugar before surgery? . Check your blood sugar at least 4 times a day, starting 2 days before surgery, to make sure that the level is not too high or low. o Check your blood sugar the morning of your surgery when you wake up and every 2 hours until you get to the Short Stay unit. . If your blood sugar is less than 70 mg/dL, you will need to treat for low blood sugar: o Do not take insulin. o Treat a low blood sugar (less than 70 mg/dL) with   cup of clear juice (cranberry or apple), 4 glucose tablets, OR glucose gel. o Recheck blood sugar in 15 minutes after treatment (to make sure it is greater than 70 mg/dL). If your blood sugar is not greater than 70 mg/dL on recheck, call [phone] for further instructions. . Report your blood sugar to the short stay nurse when you get to Short Stay.  . If you are admitted to the hospital after surgery: o Your blood sugar will be checked by the staff and you will probably be given insulin after surgery (instead of oral diabetes medicines) to make sure you have good blood sugar levels. o The goal for blood sugar control after surgery is 80-180 mg/dL.   WHAT DO I DO ABOUT MY DIABETES MEDICATION?  Marland Kitchen Do not take oral diabetes medicines (pills) the morning of surgery.   Patient Signature:  Date:   Nurse Signature:  Date:   Reviewed and Endorsed by [facility] Patient Education Committee, August 2015 ________________________________________________________________________   Incentive Spirometer  An incentive spirometer is a tool that can help keep your lungs clear and active. This tool measures how well you are filling your lungs with each breath. Taking long deep breaths may help reverse or decrease the chance of developing breathing (pulmonary) problems (especially infection) following:  A long period of time when you are unable to move or be active. BEFORE THE PROCEDURE   If the spirometer includes an indicator to show your best effort, your nurse or respiratory therapist will set it to a desired goal.  If possible, sit up straight or lean slightly forward. Try not to slouch.  Hold the incentive spirometer in an upright position. INSTRUCTIONS FOR USE  1. Sit on the edge of your bed if possible, or sit up as far as you can in bed or on a chair. 2. Hold the incentive spirometer in an upright position. 3. Breathe out normally. 4. Place the mouthpiece  in your mouth and seal your lips  tightly around it. 5. Breathe in slowly and as deeply as possible, raising the piston or the ball toward the top of the column. 6. Hold your breath for 3-5 seconds or for as long as possible. Allow the piston or ball to fall to the bottom of the column. 7. Remove the mouthpiece from your mouth and breathe out normally. 8. Rest for a few seconds and repeat Steps 1 through 7 at least 10 times every 1-2 hours when you are awake. Take your time and take a few normal breaths between deep breaths. 9. The spirometer may include an indicator to show your best effort. Use the indicator as a goal to work toward during each repetition. 10. After each set of 10 deep breaths, practice coughing to be sure your lungs are clear. If you have an incision (the cut made at the time of surgery), support your incision when coughing by placing a pillow or rolled up towels firmly against it. Once you are able to get out of bed, walk around indoors and cough well. You may stop using the incentive spirometer when instructed by your caregiver.  RISKS AND COMPLICATIONS  Take your time so you do not get dizzy or light-headed.  If you are in pain, you may need to take or ask for pain medication before doing incentive spirometry. It is harder to take a deep breath if you are having pain. AFTER USE  Rest and breathe slowly and easily.  It can be helpful to keep track of a log of your progress. Your caregiver can provide you with a simple table to help with this. If you are using the spirometer at home, follow these instructions: [place] IF:   You are having difficultly using the spirometer.  You have trouble using the spirometer as often as instructed.  Your pain medication is not giving enough relief while using the spirometer.  You develop fever of 100.5 F (38.1 C) or higher. SEEK IMMEDIATE MEDICAL CARE IF:   You cough up bloody sputum that had not been present before.  You develop fever of 102 F  (38.9 C) or greater.  You develop worsening pain at or near the incision site. MAKE SURE YOU:   Understand these instructions.  Will watch your condition.  Will get help right away if you are not doing well or get worse. Document Released: 07/17/2006 Document Revised: 05/29/2011 Document Reviewed: 09/17/2006 Exit[employer] Patient Information 2014 [employer], [place].   ________________________________________________________________________  WHAT IS A BLOOD TRANSFUSION? Blood Transfusion Information  A transfusion is the replacement of blood or some of its parts. Blood is made up of multiple cells which provide different functions.  Red blood cells carry oxygen and are used for blood loss replacement.  White blood cells fight against infection.  Platelets control bleeding.  Plasma helps clot blood.  Other blood products are available for specialized needs, such as hemophilia or other clotting disorders. BEFORE THE TRANSFUSION  Who gives blood for transfusions?   Healthy volunteers who are fully evaluated to make sure their blood is safe. This is blood bank blood. Transfusion therapy is the safest it has ever been in the practice of medicine. Before blood is taken from a donor, a complete history is taken to make sure that person has no history of diseases nor engages in risky social behavior (examples are intravenous drug use or sexual activity with multiple partners). The donor's travel history is screened to minimize  risk of transmitting infections, such as malaria. The donated blood is tested for signs of infectious diseases, such as HIV and hepatitis. The blood is then tested to be sure it is compatible with you in order to minimize the chance of a transfusion reaction. If you or a relative donates blood, this is often done in anticipation of surgery and is not appropriate for emergency situations. It takes many days to process the donated blood. RISKS AND COMPLICATIONS Although  transfusion therapy is very safe and saves many lives, the main dangers of transfusion include:   Getting an infectious disease.  Developing a transfusion reaction. This is an allergic reaction to something in the blood you were given. Every precaution is taken to prevent this. The decision to have a blood transfusion has been considered carefully by your caregiver before blood is given. Blood is not given unless the benefits outweigh the risks. AFTER THE TRANSFUSION  Right after receiving a blood transfusion, you will usually feel much better and more energetic. This is especially true if your red blood cells have gotten low (anemic). The transfusion raises the level of the red blood cells which carry oxygen, and this usually causes an energy increase.  The nurse administering the transfusion will monitor you carefully for complications. HOME CARE INSTRUCTIONS  No special instructions are needed after a transfusion. You may find your energy is better. Speak with your caregiver about any limitations on activity for underlying diseases you may have. SEEK MEDICAL CARE IF:   Your condition is not improving after your transfusion.  You develop redness or irritation at the intravenous (IV) site. SEEK IMMEDIATE MEDICAL CARE IF:  Any of the following symptoms occur over the next 12 hours:  Shaking chills.  You have a temperature by mouth above 102 F (38.9 C), not controlled by medicine.  Chest, back, or muscle pain.  People around you feel you are not acting correctly or are confused.  Shortness of breath or difficulty breathing.  Dizziness and fainting.  You get a rash or develop hives.  You have a decrease in urine output.  Your urine turns a dark color or changes to pink, red, or [place]. Any of the following symptoms occur over the next 10 days:  You have a temperature by mouth above 102 F (38.9 C), not controlled by medicine.  Shortness of breath.  Weakness after normal  activity.  The white part of the eye turns yellow (jaundice).  You have a decrease in the amount of urine or are urinating less often.  Your urine turns a dark color or changes to pink, red, or [place]. Document Released: 03/03/2000 Document Revised: 05/29/2011 Document Reviewed: 10/21/2007 [facility] Patient Information 2014 [employer], [place].  _______________________________________________________________________

## 2016-03-15 ENCOUNTER — Encounter (HOSPITAL_COMMUNITY)
Admission: RE | Admit: 2016-03-15 | Discharge: 2016-03-15 | Disposition: A | Discharge status: Home / Self Care | Payer: BLUE CROSS/BLUE SHIELD | Source: Ambulatory Visit | Attending: Orthopedic Surgery | Admitting: Orthopedic Surgery

## 2016-03-15 ENCOUNTER — Encounter (HOSPITAL_COMMUNITY): Payer: Self-pay

## 2016-03-15 DIAGNOSIS — M1612 Unilateral primary osteoarthritis, left hip: Secondary | ICD-10-CM | POA: Diagnosis not present

## 2016-03-15 DIAGNOSIS — Z01812 Encounter for preprocedural laboratory examination: Secondary | ICD-10-CM | POA: Insufficient documentation

## 2016-03-15 DIAGNOSIS — Z01818 Encounter for other preprocedural examination: Secondary | ICD-10-CM | POA: Diagnosis not present

## 2016-03-15 DIAGNOSIS — Z0183 Encounter for blood typing: Secondary | ICD-10-CM | POA: Diagnosis not present

## 2016-03-15 HISTORY — DX: Chronic kidney disease, unspecified: N18.9

## 2016-03-15 LAB — CBC
HEMATOCRIT: 34.6 % — AB (ref 36.0–46.0)
HEMOGLOBIN: 11.4 g/dL — AB (ref 12.0–15.0)
MCH: 28.8 pg (ref 26.0–34.0)
MCHC: 32.9 g/dL (ref 30.0–36.0)
MCV: 87.4 fL (ref 78.0–100.0)
Platelets: 233 10*3/uL (ref 150–400)
RBC: 3.96 MIL/uL (ref 3.87–5.11)
RDW: 14.9 % (ref 11.5–15.5)
WBC: 6.7 10*3/uL (ref 4.0–10.5)

## 2016-03-15 LAB — URINALYSIS, ROUTINE W REFLEX MICROSCOPIC
BACTERIA UA: NONE SEEN
Bilirubin Urine: NEGATIVE
GLUCOSE, UA: NEGATIVE mg/dL
HGB URINE DIPSTICK: NEGATIVE
KETONES UR: NEGATIVE mg/dL
NITRITE: NEGATIVE
PH: 6 (ref 5.0–8.0)
PROTEIN: NEGATIVE mg/dL
Specific Gravity, Urine: 1.016 (ref 1.005–1.030)

## 2016-03-15 LAB — COMPREHENSIVE METABOLIC PANEL
ALBUMIN: 4.4 g/dL (ref 3.5–5.0)
ALK PHOS: 56 U/L (ref 38–126)
ALT: 19 U/L (ref 14–54)
AST: 38 U/L (ref 15–41)
Anion gap: 8 (ref 5–15)
BILIRUBIN TOTAL: 0.5 mg/dL (ref 0.3–1.2)
BUN: 32 mg/dL — AB (ref 6–20)
CALCIUM: 9.9 mg/dL (ref 8.9–10.3)
CO2: 29 mmol/L (ref 22–32)
CREATININE: 1.57 mg/dL — AB (ref 0.44–1.00)
Chloride: 102 mmol/L (ref 101–111)
GFR calc Af Amer: 39 mL/min — ABNORMAL LOW (ref >60)
GFR, EST NON AFRICAN AMERICAN: 34 mL/min — AB (ref >60)
GLUCOSE: 139 mg/dL — AB (ref 65–99)
POTASSIUM: 4.9 mmol/L (ref 3.5–5.1)
Sodium: 139 mmol/L (ref 135–145)
TOTAL PROTEIN: 7.7 g/dL (ref 6.5–8.1)

## 2016-03-15 LAB — APTT: APTT: 27 s (ref 24–36)

## 2016-03-15 LAB — SURGICAL PCR SCREEN
MRSA, PCR: NEGATIVE
Staphylococcus aureus: NEGATIVE

## 2016-03-15 LAB — PROTIME-INR
INR: 1.04
PROTHROMBIN TIME: 13.7 s (ref 11.4–15.2)

## 2016-03-15 LAB — ABO/RH: ABO/RH(D): O POS

## 2016-03-15 LAB — GLUCOSE, CAPILLARY: GLUCOSE-CAPILLARY: 150 mg/dL — AB (ref 65–99)

## 2016-03-15 NOTE — Progress Notes (Signed)
Left message with Dr. Rhona Leavens office.

## 2016-03-15 NOTE — Progress Notes (Signed)
03-15-16 Labs viewable in Epic-note CMP results-pt has history of some renal functions issues being followed by Dr. Tami Ribas.

## 2016-03-15 NOTE — Pre-Procedure Instructions (Addendum)
EKG 11'17, labs 02-24-16 Epic-CBC/d,CMP(BUN -abnormal) A1C=7.0 Epic. 03-15-16 1010 Dr. Cheri Kearns. Fitzgerald reviewed labs of 02-24-16 Epic- states repeat  today.

## 2016-03-16 ENCOUNTER — Other Ambulatory Visit: Payer: Self-pay | Admitting: Family Medicine

## 2016-03-17 NOTE — Telephone Encounter (Signed)
May have this +3 refills 

## 2016-03-21 ENCOUNTER — Other Ambulatory Visit: Payer: Self-pay | Admitting: Nurse Practitioner

## 2016-03-22 ENCOUNTER — Inpatient Hospital Stay (HOSPITAL_COMMUNITY): Payer: BLUE CROSS/BLUE SHIELD

## 2016-03-22 ENCOUNTER — Encounter: Payer: Self-pay | Admitting: Family Medicine

## 2016-03-22 ENCOUNTER — Inpatient Hospital Stay (HOSPITAL_COMMUNITY)
Admission: RE | Admit: 2016-03-22 | Discharge: 2016-03-24 | DRG: 470 | Disposition: A | Discharge status: Home / Self Care | Payer: BLUE CROSS/BLUE SHIELD | Source: Ambulatory Visit | Attending: Orthopedic Surgery | Admitting: Orthopedic Surgery

## 2016-03-22 ENCOUNTER — Encounter (HOSPITAL_COMMUNITY): Payer: Self-pay | Admitting: *Deleted

## 2016-03-22 ENCOUNTER — Encounter (HOSPITAL_COMMUNITY)
Admission: RE | Disposition: A | Discharge status: Home / Self Care | Payer: Self-pay | Source: Ambulatory Visit | Attending: Orthopedic Surgery

## 2016-03-22 ENCOUNTER — Inpatient Hospital Stay (HOSPITAL_COMMUNITY): Payer: BLUE CROSS/BLUE SHIELD | Admitting: Anesthesiology

## 2016-03-22 DIAGNOSIS — Z973 Presence of spectacles and contact lenses: Secondary | ICD-10-CM

## 2016-03-22 DIAGNOSIS — Z833 Family history of diabetes mellitus: Secondary | ICD-10-CM | POA: Diagnosis not present

## 2016-03-22 DIAGNOSIS — Z7984 Long term (current) use of oral hypoglycemic drugs: Secondary | ICD-10-CM | POA: Diagnosis not present

## 2016-03-22 DIAGNOSIS — I129 Hypertensive chronic kidney disease with stage 1 through stage 4 chronic kidney disease, or unspecified chronic kidney disease: Secondary | ICD-10-CM | POA: Diagnosis present

## 2016-03-22 DIAGNOSIS — M109 Gout, unspecified: Secondary | ICD-10-CM | POA: Diagnosis present

## 2016-03-22 DIAGNOSIS — Z6835 Body mass index (BMI) 35.0-35.9, adult: Secondary | ICD-10-CM | POA: Diagnosis not present

## 2016-03-22 DIAGNOSIS — M1612 Unilateral primary osteoarthritis, left hip: Principal | ICD-10-CM | POA: Diagnosis present

## 2016-03-22 DIAGNOSIS — R011 Cardiac murmur, unspecified: Secondary | ICD-10-CM | POA: Diagnosis present

## 2016-03-22 DIAGNOSIS — D631 Anemia in chronic kidney disease: Secondary | ICD-10-CM | POA: Diagnosis present

## 2016-03-22 DIAGNOSIS — M25552 Pain in left hip: Secondary | ICD-10-CM | POA: Diagnosis present

## 2016-03-22 DIAGNOSIS — I350 Nonrheumatic aortic (valve) stenosis: Secondary | ICD-10-CM | POA: Diagnosis present

## 2016-03-22 DIAGNOSIS — Z87891 Personal history of nicotine dependence: Secondary | ICD-10-CM | POA: Diagnosis not present

## 2016-03-22 DIAGNOSIS — E1122 Type 2 diabetes mellitus with diabetic chronic kidney disease: Secondary | ICD-10-CM | POA: Diagnosis present

## 2016-03-22 DIAGNOSIS — Z972 Presence of dental prosthetic device (complete) (partial): Secondary | ICD-10-CM | POA: Diagnosis not present

## 2016-03-22 DIAGNOSIS — K219 Gastro-esophageal reflux disease without esophagitis: Secondary | ICD-10-CM | POA: Diagnosis present

## 2016-03-22 DIAGNOSIS — Z8249 Family history of ischemic heart disease and other diseases of the circulatory system: Secondary | ICD-10-CM

## 2016-03-22 DIAGNOSIS — E7800 Pure hypercholesterolemia, unspecified: Secondary | ICD-10-CM | POA: Diagnosis present

## 2016-03-22 DIAGNOSIS — Z7982 Long term (current) use of aspirin: Secondary | ICD-10-CM | POA: Diagnosis not present

## 2016-03-22 DIAGNOSIS — Z8261 Family history of arthritis: Secondary | ICD-10-CM | POA: Diagnosis not present

## 2016-03-22 DIAGNOSIS — E669 Obesity, unspecified: Secondary | ICD-10-CM | POA: Diagnosis present

## 2016-03-22 DIAGNOSIS — Z791 Long term (current) use of non-steroidal anti-inflammatories (NSAID): Secondary | ICD-10-CM | POA: Diagnosis not present

## 2016-03-22 DIAGNOSIS — Z96649 Presence of unspecified artificial hip joint: Secondary | ICD-10-CM

## 2016-03-22 DIAGNOSIS — E039 Hypothyroidism, unspecified: Secondary | ICD-10-CM | POA: Diagnosis present

## 2016-03-22 DIAGNOSIS — Z79899 Other long term (current) drug therapy: Secondary | ICD-10-CM

## 2016-03-22 DIAGNOSIS — M169 Osteoarthritis of hip, unspecified: Secondary | ICD-10-CM | POA: Diagnosis present

## 2016-03-22 DIAGNOSIS — N189 Chronic kidney disease, unspecified: Secondary | ICD-10-CM | POA: Diagnosis present

## 2016-03-22 HISTORY — PX: TOTAL HIP ARTHROPLASTY: SHX124

## 2016-03-22 LAB — GLUCOSE, CAPILLARY
Glucose-Capillary: 143 mg/dL — ABNORMAL HIGH (ref 65–99)
Glucose-Capillary: 182 mg/dL — ABNORMAL HIGH (ref 65–99)
Glucose-Capillary: 320 mg/dL — ABNORMAL HIGH (ref 65–99)

## 2016-03-22 LAB — TYPE AND SCREEN
ABO/RH(D): O POS
ANTIBODY SCREEN: NEGATIVE

## 2016-03-22 SURGERY — ARTHROPLASTY, HIP, TOTAL, ANTERIOR APPROACH
Anesthesia: General | Site: Hip | Laterality: Left

## 2016-03-22 MED ORDER — ONDANSETRON HCL 4 MG/2ML IJ SOLN
INTRAMUSCULAR | Status: DC | PRN
  Administered 2016-03-22: 4 mg via INTRAVENOUS

## 2016-03-22 MED ORDER — SODIUM CHLORIDE 0.9 % IV SOLN
INTRAVENOUS | Status: DC
  Administered 2016-03-23: 01:00:00 via INTRAVENOUS

## 2016-03-22 MED ORDER — ROCURONIUM BROMIDE 50 MG/5ML IV SOSY
PREFILLED_SYRINGE | INTRAVENOUS | Status: AC
  Filled 2016-03-22: qty 5

## 2016-03-22 MED ORDER — FUROSEMIDE 40 MG PO TABS
40.0000 mg | ORAL_TABLET | Freq: Every day | ORAL | Status: DC
  Administered 2016-03-23 – 2016-03-24 (×2): 40 mg via ORAL
  Filled 2016-03-22 (×2): qty 1

## 2016-03-22 MED ORDER — MIDAZOLAM HCL 2 MG/2ML IJ SOLN
INTRAMUSCULAR | Status: AC
  Filled 2016-03-22: qty 2

## 2016-03-22 MED ORDER — MENTHOL 3 MG MT LOZG: 1.0000 | LOZENGE | OROMUCOSAL | Status: DC | PRN

## 2016-03-22 MED ORDER — PROPOFOL 10 MG/ML IV BOLUS
INTRAVENOUS | Status: AC
  Filled 2016-03-22: qty 20

## 2016-03-22 MED ORDER — ACETAMINOPHEN 10 MG/ML IV SOLN
INTRAVENOUS | Status: AC
  Filled 2016-03-22: qty 100

## 2016-03-22 MED ORDER — METOCLOPRAMIDE HCL 5 MG/ML IJ SOLN: 5.0000 mg | Freq: Three times a day (TID) | INTRAMUSCULAR | Status: DC | PRN

## 2016-03-22 MED ORDER — ACETAMINOPHEN 500 MG PO TABS
1000.0000 mg | ORAL_TABLET | Freq: Four times a day (QID) | ORAL | Status: AC
  Administered 2016-03-22 – 2016-03-23 (×4): 1000 mg via ORAL
  Filled 2016-03-22 (×4): qty 2

## 2016-03-22 MED ORDER — DEXAMETHASONE SODIUM PHOSPHATE 10 MG/ML IJ SOLN
10.0000 mg | Freq: Once | INTRAMUSCULAR | Status: AC
  Administered 2016-03-22: 10 mg via INTRAVENOUS

## 2016-03-22 MED ORDER — DOCUSATE SODIUM 100 MG PO CAPS
100.0000 mg | ORAL_CAPSULE | Freq: Two times a day (BID) | ORAL | Status: DC
  Administered 2016-03-22 – 2016-03-24 (×4): 100 mg via ORAL
  Filled 2016-03-22 (×4): qty 1

## 2016-03-22 MED ORDER — SUGAMMADEX SODIUM 500 MG/5ML IV SOLN
INTRAVENOUS | Status: DC | PRN
  Administered 2016-03-22: 225 mg via INTRAVENOUS

## 2016-03-22 MED ORDER — POLYETHYLENE GLYCOL 3350 17 G PO PACK: 17.0000 g | PACK | Freq: Every day | ORAL | Status: DC | PRN

## 2016-03-22 MED ORDER — DEXTROSE 5 % IV SOLN
INTRAVENOUS | Status: DC | PRN
  Administered 2016-03-22: 40 ug/min via INTRAVENOUS

## 2016-03-22 MED ORDER — STERILE WATER FOR IRRIGATION IR SOLN
Status: DC | PRN
  Administered 2016-03-22: 2000 mL

## 2016-03-22 MED ORDER — BISACODYL 10 MG RE SUPP: 10.0000 mg | Freq: Every day | RECTAL | Status: DC | PRN

## 2016-03-22 MED ORDER — PHENYLEPHRINE 40 MCG/ML (10ML) SYRINGE FOR IV PUSH (FOR BLOOD PRESSURE SUPPORT)
PREFILLED_SYRINGE | INTRAVENOUS | Status: AC
  Filled 2016-03-22: qty 10

## 2016-03-22 MED ORDER — PROPOFOL 10 MG/ML IV BOLUS
INTRAVENOUS | Status: DC | PRN
  Administered 2016-03-22: 120 mg via INTRAVENOUS

## 2016-03-22 MED ORDER — ROCURONIUM BROMIDE 10 MG/ML (PF) SYRINGE
PREFILLED_SYRINGE | INTRAVENOUS | Status: DC | PRN
  Administered 2016-03-22: 50 mg via INTRAVENOUS

## 2016-03-22 MED ORDER — LACTATED RINGERS IV SOLN
INTRAVENOUS | Status: DC | PRN
  Administered 2016-03-22 (×2): via INTRAVENOUS

## 2016-03-22 MED ORDER — LIDOCAINE 2% (20 MG/ML) 5 ML SYRINGE
INTRAMUSCULAR | Status: DC | PRN
  Administered 2016-03-22: 80 mg via INTRAVENOUS

## 2016-03-22 MED ORDER — FENTANYL CITRATE (PF) 100 MCG/2ML IJ SOLN
INTRAMUSCULAR | Status: AC
  Filled 2016-03-22: qty 4

## 2016-03-22 MED ORDER — METOCLOPRAMIDE HCL 5 MG PO TABS: 5.0000 mg | ORAL_TABLET | Freq: Three times a day (TID) | ORAL | Status: DC | PRN

## 2016-03-22 MED ORDER — ACETAMINOPHEN 10 MG/ML IV SOLN
1000.0000 mg | Freq: Once | INTRAVENOUS | Status: AC
  Administered 2016-03-22: 1000 mg via INTRAVENOUS

## 2016-03-22 MED ORDER — METOCLOPRAMIDE HCL 5 MG/ML IJ SOLN: 10.0000 mg | Freq: Once | INTRAMUSCULAR | Status: DC | PRN

## 2016-03-22 MED ORDER — CEFAZOLIN SODIUM-DEXTROSE 2-4 GM/100ML-% IV SOLN
2.0000 g | Freq: Four times a day (QID) | INTRAVENOUS | Status: AC
  Administered 2016-03-22 – 2016-03-23 (×2): 2 g via INTRAVENOUS
  Filled 2016-03-22 (×2): qty 100

## 2016-03-22 MED ORDER — ONDANSETRON HCL 4 MG PO TABS
4.0000 mg | ORAL_TABLET | Freq: Four times a day (QID) | ORAL | Status: DC | PRN
  Administered 2016-03-24: 08:00:00 4 mg via ORAL
  Filled 2016-03-22: qty 1

## 2016-03-22 MED ORDER — CEFAZOLIN SODIUM-DEXTROSE 2-4 GM/100ML-% IV SOLN
2.0000 g | INTRAVENOUS | Status: AC
  Administered 2016-03-22: 2 g via INTRAVENOUS

## 2016-03-22 MED ORDER — 0.9 % SODIUM CHLORIDE (POUR BTL) OPTIME
TOPICAL | Status: DC | PRN
  Administered 2016-03-22: 1000 mL

## 2016-03-22 MED ORDER — DIPHENHYDRAMINE HCL 12.5 MG/5ML PO ELIX: 12.5000 mg | ORAL_SOLUTION | ORAL | Status: DC | PRN

## 2016-03-22 MED ORDER — LIDOCAINE 2% (20 MG/ML) 5 ML SYRINGE
INTRAMUSCULAR | Status: AC
  Filled 2016-03-22: qty 5

## 2016-03-22 MED ORDER — PHENYLEPHRINE HCL 10 MG/ML IJ SOLN
INTRAMUSCULAR | Status: AC
  Filled 2016-03-22: qty 1

## 2016-03-22 MED ORDER — BUPIVACAINE HCL (PF) 0.25 % IJ SOLN
INTRAMUSCULAR | Status: AC
  Filled 2016-03-22: qty 30

## 2016-03-22 MED ORDER — BUPIVACAINE HCL (PF) 0.25 % IJ SOLN
INTRAMUSCULAR | Status: DC | PRN
  Administered 2016-03-22: 30 mL

## 2016-03-22 MED ORDER — OXYCODONE HCL 5 MG PO TABS
5.0000 mg | ORAL_TABLET | ORAL | Status: DC | PRN
  Administered 2016-03-22 – 2016-03-23 (×3): 10 mg via ORAL
  Administered 2016-03-23: 5 mg via ORAL
  Administered 2016-03-23: 10 mg via ORAL
  Administered 2016-03-24 (×2): 5 mg via ORAL
  Filled 2016-03-22 (×3): qty 2
  Filled 2016-03-22 (×2): qty 1
  Filled 2016-03-22: qty 2
  Filled 2016-03-22: qty 1
  Filled 2016-03-22: qty 2

## 2016-03-22 MED ORDER — CITALOPRAM HYDROBROMIDE 20 MG PO TABS
30.0000 mg | ORAL_TABLET | Freq: Every day | ORAL | Status: DC
  Administered 2016-03-23 – 2016-03-24 (×2): 30 mg via ORAL
  Filled 2016-03-22 (×2): qty 2

## 2016-03-22 MED ORDER — FERROUS SULFATE 325 (65 FE) MG PO TABS
325.0000 mg | ORAL_TABLET | Freq: Every day | ORAL | Status: DC
  Administered 2016-03-23 – 2016-03-24 (×2): 325 mg via ORAL
  Filled 2016-03-22 (×2): qty 1

## 2016-03-22 MED ORDER — ACETAMINOPHEN 325 MG PO TABS
650.0000 mg | ORAL_TABLET | Freq: Four times a day (QID) | ORAL | Status: DC | PRN
  Administered 2016-03-24: 06:00:00 650 mg via ORAL
  Filled 2016-03-22: qty 2

## 2016-03-22 MED ORDER — APIXABAN 2.5 MG PO TABS
2.5000 mg | ORAL_TABLET | Freq: Two times a day (BID) | ORAL | Status: DC
  Administered 2016-03-23 – 2016-03-24 (×3): 2.5 mg via ORAL
  Filled 2016-03-22 (×3): qty 1

## 2016-03-22 MED ORDER — FENTANYL CITRATE (PF) 100 MCG/2ML IJ SOLN
INTRAMUSCULAR | Status: DC | PRN
  Administered 2016-03-22 (×2): 50 ug via INTRAVENOUS
  Administered 2016-03-22: 100 ug via INTRAVENOUS

## 2016-03-22 MED ORDER — LACTATED RINGERS IV SOLN: INTRAVENOUS | Status: DC

## 2016-03-22 MED ORDER — DEXAMETHASONE SODIUM PHOSPHATE 10 MG/ML IJ SOLN: 10.0000 mg | Freq: Once | INTRAMUSCULAR | Status: DC

## 2016-03-22 MED ORDER — PANTOPRAZOLE SODIUM 40 MG PO TBEC
40.0000 mg | DELAYED_RELEASE_TABLET | Freq: Every day | ORAL | Status: DC
  Administered 2016-03-23: 09:00:00 40 mg via ORAL
  Filled 2016-03-22: qty 1

## 2016-03-22 MED ORDER — LACTATED RINGERS IV SOLN
INTRAVENOUS | Status: DC
  Administered 2016-03-22: 12:00:00 via INTRAVENOUS

## 2016-03-22 MED ORDER — LABETALOL HCL 5 MG/ML IV SOLN
INTRAVENOUS | Status: AC
  Filled 2016-03-22: qty 4

## 2016-03-22 MED ORDER — METHOCARBAMOL 500 MG PO TABS
500.0000 mg | ORAL_TABLET | Freq: Four times a day (QID) | ORAL | Status: DC | PRN
  Administered 2016-03-23 – 2016-03-24 (×2): 500 mg via ORAL
  Filled 2016-03-22 (×2): qty 1

## 2016-03-22 MED ORDER — ONDANSETRON HCL 4 MG/2ML IJ SOLN: 4.0000 mg | Freq: Four times a day (QID) | INTRAMUSCULAR | Status: DC | PRN

## 2016-03-22 MED ORDER — MEPERIDINE HCL 50 MG/ML IJ SOLN: 6.2500 mg | INTRAMUSCULAR | Status: DC | PRN

## 2016-03-22 MED ORDER — HYDROMORPHONE HCL 1 MG/ML IJ SOLN
INTRAMUSCULAR | Status: DC | PRN
  Administered 2016-03-22 (×4): 0.5 mg via INTRAVENOUS

## 2016-03-22 MED ORDER — ALLOPURINOL 100 MG PO TABS
100.0000 mg | ORAL_TABLET | Freq: Two times a day (BID) | ORAL | Status: DC
  Administered 2016-03-23 – 2016-03-24 (×3): 100 mg via ORAL
  Filled 2016-03-22 (×3): qty 1

## 2016-03-22 MED ORDER — TRANEXAMIC ACID 1000 MG/10ML IV SOLN
1000.0000 mg | INTRAVENOUS | Status: AC
  Administered 2016-03-22: 1000 mg via INTRAVENOUS
  Filled 2016-03-22: qty 10

## 2016-03-22 MED ORDER — PRAVASTATIN SODIUM 20 MG PO TABS
40.0000 mg | ORAL_TABLET | Freq: Every day | ORAL | Status: DC
Start: 2016-03-23 — End: 2016-03-24
  Administered 2016-03-23 – 2016-03-24 (×2): 40 mg via ORAL
  Filled 2016-03-22 (×2): qty 2

## 2016-03-22 MED ORDER — INSULIN ASPART 100 UNIT/ML ~~LOC~~ SOLN
0.0000 [IU] | Freq: Three times a day (TID) | SUBCUTANEOUS | Status: DC
  Administered 2016-03-23 (×2): 2 [IU] via SUBCUTANEOUS
  Administered 2016-03-23: 09:00:00 3 [IU] via SUBCUTANEOUS

## 2016-03-22 MED ORDER — DEXAMETHASONE SODIUM PHOSPHATE 10 MG/ML IJ SOLN
INTRAMUSCULAR | Status: AC
  Filled 2016-03-22: qty 1

## 2016-03-22 MED ORDER — CHLORHEXIDINE GLUCONATE 4 % EX LIQD: 60.0000 mL | Freq: Once | CUTANEOUS | Status: DC

## 2016-03-22 MED ORDER — FLEET ENEMA 7-19 GM/118ML RE ENEM: 1.0000 | ENEMA | Freq: Once | RECTAL | Status: DC | PRN

## 2016-03-22 MED ORDER — CEFAZOLIN SODIUM-DEXTROSE 2-4 GM/100ML-% IV SOLN
INTRAVENOUS | Status: AC
  Filled 2016-03-22: qty 100

## 2016-03-22 MED ORDER — LINAGLIPTIN 5 MG PO TABS
5.0000 mg | ORAL_TABLET | Freq: Every day | ORAL | Status: DC
  Administered 2016-03-23 – 2016-03-24 (×2): 5 mg via ORAL
  Filled 2016-03-22 (×2): qty 1

## 2016-03-22 MED ORDER — DEXTROSE 5 % IV SOLN
500.0000 mg | Freq: Four times a day (QID) | INTRAVENOUS | Status: DC | PRN
  Administered 2016-03-22: 500 mg via INTRAVENOUS
  Filled 2016-03-22: qty 5
  Filled 2016-03-22: qty 550

## 2016-03-22 MED ORDER — COLCHICINE 0.6 MG PO TABS
0.6000 mg | ORAL_TABLET | Freq: Every day | ORAL | Status: DC
  Administered 2016-03-23 – 2016-03-24 (×2): 0.6 mg via ORAL
  Filled 2016-03-22 (×2): qty 1

## 2016-03-22 MED ORDER — PHENOL 1.4 % MT LIQD: 1.0000 | OROMUCOSAL | Status: DC | PRN

## 2016-03-22 MED ORDER — HYDROMORPHONE HCL 1 MG/ML IJ SOLN
INTRAMUSCULAR | Status: AC
  Administered 2016-03-22: 0.5 mg via INTRAVENOUS
  Filled 2016-03-22: qty 1

## 2016-03-22 MED ORDER — ONDANSETRON HCL 4 MG/2ML IJ SOLN
INTRAMUSCULAR | Status: AC
  Filled 2016-03-22: qty 2

## 2016-03-22 MED ORDER — MORPHINE SULFATE (PF) 2 MG/ML IV SOLN: 1.0000 mg | INTRAVENOUS | Status: DC | PRN

## 2016-03-22 MED ORDER — LEVOTHYROXINE SODIUM 125 MCG PO TABS
125.0000 ug | ORAL_TABLET | Freq: Every day | ORAL | Status: DC
  Administered 2016-03-23 – 2016-03-24 (×2): 125 ug via ORAL
  Filled 2016-03-22 (×2): qty 1

## 2016-03-22 MED ORDER — HYDROMORPHONE HCL 2 MG/ML IJ SOLN
INTRAMUSCULAR | Status: AC
  Filled 2016-03-22: qty 1

## 2016-03-22 MED ORDER — ACETAMINOPHEN 650 MG RE SUPP: 650.0000 mg | Freq: Four times a day (QID) | RECTAL | Status: DC | PRN

## 2016-03-22 MED ORDER — ZOLPIDEM TARTRATE 10 MG PO TABS
10.0000 mg | ORAL_TABLET | Freq: Every day | ORAL | Status: DC
  Administered 2016-03-22 – 2016-03-23 (×2): 10 mg via ORAL
  Filled 2016-03-22 (×2): qty 1

## 2016-03-22 MED ORDER — SUGAMMADEX SODIUM 500 MG/5ML IV SOLN
INTRAVENOUS | Status: AC
  Filled 2016-03-22: qty 5

## 2016-03-22 MED ORDER — HYDROMORPHONE HCL 1 MG/ML IJ SOLN
0.2500 mg | INTRAMUSCULAR | Status: DC | PRN
  Administered 2016-03-22: 0.5 mg via INTRAVENOUS

## 2016-03-22 SURGICAL SUPPLY — 38 items
BAG SPEC THK2 15X12 ZIP CLS (MISCELLANEOUS) ×1
BAG ZIPLOCK 12X15 (MISCELLANEOUS) ×2 IMPLANT
BLADE SAG 18X100X1.27 (BLADE) ×3 IMPLANT
CAPT HIP TOTAL 2 ×2 IMPLANT
CLOSURE WOUND 1/2 X4 (GAUZE/BANDAGES/DRESSINGS) ×2
CLOTH BEACON ORANGE TIMEOUT ST (SAFETY) ×3 IMPLANT
COVER PERINEAL POST (MISCELLANEOUS) ×3 IMPLANT
DRAPE STERI IOBAN 125X83 (DRAPES) ×3 IMPLANT
DRAPE U-SHAPE 47X51 STRL (DRAPES) ×6 IMPLANT
DRSG ADAPTIC 3X8 NADH LF (GAUZE/BANDAGES/DRESSINGS) ×3 IMPLANT
DRSG MEPILEX BORDER 4X4 (GAUZE/BANDAGES/DRESSINGS) ×3 IMPLANT
DRSG MEPILEX BORDER 4X8 (GAUZE/BANDAGES/DRESSINGS) ×3 IMPLANT
DURAPREP 26ML APPLICATOR (WOUND CARE) ×3 IMPLANT
ELECT REM PT RETURN 9FT ADLT (ELECTROSURGICAL) ×3
ELECTRODE REM PT RTRN 9FT ADLT (ELECTROSURGICAL) ×1 IMPLANT
EVACUATOR 1/8 PVC DRAIN (DRAIN) ×3 IMPLANT
GLOVE BIO SURGEON STRL SZ7.5 (GLOVE) ×3 IMPLANT
GLOVE BIO SURGEON STRL SZ8 (GLOVE) ×8 IMPLANT
GLOVE BIOGEL PI IND STRL 7.0 (GLOVE) IMPLANT
GLOVE BIOGEL PI IND STRL 7.5 (GLOVE) IMPLANT
GLOVE BIOGEL PI IND STRL 8 (GLOVE) ×2 IMPLANT
GLOVE BIOGEL PI INDICATOR 7.0 (GLOVE) ×4
GLOVE BIOGEL PI INDICATOR 7.5 (GLOVE) ×4
GLOVE BIOGEL PI INDICATOR 8 (GLOVE) ×4
GLOVE SURG SS PI 7.5 STRL IVOR (GLOVE) ×2 IMPLANT
GOWN SPEC L3 XXLG W/TWL (GOWN DISPOSABLE) ×2 IMPLANT
GOWN STRL REUS W/TWL LRG LVL3 (GOWN DISPOSABLE) ×3 IMPLANT
GOWN STRL REUS W/TWL XL LVL3 (GOWN DISPOSABLE) ×5 IMPLANT
PACK ANTERIOR HIP CUSTOM (KITS) ×3 IMPLANT
STRIP CLOSURE SKIN 1/2X4 (GAUZE/BANDAGES/DRESSINGS) ×3 IMPLANT
SUT ETHIBOND NAB CT1 #1 30IN (SUTURE) ×3 IMPLANT
SUT MNCRL AB 4-0 PS2 18 (SUTURE) ×3 IMPLANT
SUT VIC AB 2-0 CT1 27 (SUTURE) ×9
SUT VIC AB 2-0 CT1 TAPERPNT 27 (SUTURE) ×2 IMPLANT
SUT VLOC 180 0 24IN GS25 (SUTURE) ×3 IMPLANT
SYR 50ML LL SCALE MARK (SYRINGE) IMPLANT
TRAY FOLEY CATH SILVER 14FR (SET/KITS/TRAYS/PACK) ×2 IMPLANT
YANKAUER SUCT BULB TIP 10FT TU (MISCELLANEOUS) ×3 IMPLANT

## 2016-03-22 NOTE — Interval H&P Note (Signed)
History and Physical Interval Note:  03/22/2016 1:20 PM  Sabrina Deleon  has presented today for surgery, with the diagnosis of left hip OA  The various methods of treatment have been discussed with the patient and family. After consideration of risks, benefits and other options for treatment, the patient has consented to  Procedure(s): LEFT TOTAL HIP ARTHROPLASTY ANTERIOR APPROACH (Left) as a surgical intervention .  The patient's history has been reviewed, patient examined, no change in status, stable for surgery.  I have reviewed the patient's chart and labs.  Questions were answered to the patient's satisfaction.     [name]

## 2016-03-22 NOTE — Transfer of Care (Signed)
Immediate Anesthesia Transfer of Care Note  Patient: Sabrina Deleon  Procedure(s) Performed: Procedure(s): LEFT TOTAL HIP ARTHROPLASTY ANTERIOR APPROACH (Left)  Patient Location: PACU  Anesthesia Type:General  Level of Consciousness:  sedated, patient cooperative and responds to stimulation  Airway & Oxygen Therapy:Patient Spontanous Breathing and Patient connected to face mask oxgen  Post-op Assessment:  Report given to PACU RN and Post -op Vital signs reviewed and stable  Post vital signs:  Reviewed and stable  Last Vitals:  Vitals:   03/22/16 1014  BP: 133/82  Pulse: 76  Resp: 18  Temp: [id number] C    Complications: No apparent anesthesia complications

## 2016-03-22 NOTE — Anesthesia Postprocedure Evaluation (Signed)
Anesthesia Post Note  Patient: Talaysia R Carapia  Procedure(s) Performed: Procedure(s) (LRB): LEFT TOTAL HIP ARTHROPLASTY ANTERIOR APPROACH (Left)  Patient location during evaluation: PACU Anesthesia Type: General Level of consciousness: awake and alert Pain management: pain level controlled Vital Signs Assessment: post-procedure vital signs reviewed and stable Respiratory status: spontaneous breathing, nonlabored ventilation, respiratory function stable and patient connected to nasal cannula oxygen Cardiovascular status: blood pressure returned to baseline and stable Postop Assessment: no signs of nausea or vomiting Anesthetic complications: no       Last Vitals:  Vitals:   03/22/16 1645 03/22/16 1700  BP: (!) 147/71 137/67  Pulse: 83 75  Resp: 16 11  Temp:      Last Pain:  Vitals:   03/22/16 1645  TempSrc:   PainSc: Asleep                 Catalina Gravel

## 2016-03-22 NOTE — H&P (View-Only) (Signed)
Marland Kitchen DOB: [birth year] Married / Language: English / Race: White Female Date of Admission:  03/22/2016 CC: Left Hip Pain History of Present Illness  The patient is a 66 year old female who comes in for a preoperative History and Physical. The patient is scheduled for a left total hip arthroplasty (anterior appraoch) to be performed by Dr. Dione Plover. Aluisio, MD at [facility] on 03/22/2016 . The patient is a 66 year old female who presented for follow up of their hip. The patient is being followed for their left hip pain and osteoarthritis. Symptoms reported include: pain, aching, stiffness, grinding, difficulty ambulating and difficulty arising from chair. The patient feels that they are doing poorly and report their pain level to be severe. The following medication has been used for pain control: antiinflammatory medication (meloxicam) and Hydrocodone (7.5; takes 1/2 tab, mainly at night). Chryl has had problems with this hip for about the year or two, but much worse over the past six months. She is at a stage where the hip is hurting her all the time. It is limiting what she can and cannot do. She has activity pain as well as night pain. She cannot do her activities of daily living. AP pelvis and AP and lateral of the left hip from few months ago, showed she is bone non bone in the hip with essentially zero joint space left. She has got advanced end stage arthritis, left hip with progressive pain and dysfunction. At this point, the most predictable means of improving her pain and function is total hip arthroplasty. They have been treated conservatively in the past for the above stated problem and despite conservative measures, they continue to have progressive pain and severe functional limitations and dysfunction. They have failed non-operative management including home exercise, medications. It is felt that they would benefit from undergoing total joint replacement. Risks and benefits of the  procedure have been discussed with the patient and they elect to proceed with surgery. There are no active contraindications to surgery such as ongoing infection or rapidly progressive neurological disease.   Problem List/Past Medical  Primary osteoarthritis of left hip (M16.12)  Aortic stenosis, moderate (I35.0) (recent ECHO as per patient) Anemia  Anxiety Disorder  Diabetes Mellitus, Type II  Hiatal Hernia  Gastroesophageal Reflux Disease  Gout  Heart murmur  High blood pressure  Hypercholesterolemia  Osteoarthritis  Hemorrhoids  Irritable bowel syndrome  Pancreatitis  Past History Urinary Incontinence   Allergies No Known Drug Allergies   Family History Cancer  Maternal Grandmother, Mother. Diabetes Mellitus  Maternal Grandmother. Hypertension  Father. Osteoarthritis  Mother. Severe allergy  Mother.  Social History Children  1 Current drinker  12/22/2015: Currently drinks beer only occasionally per week Current work status  working full time Exercise  Exercises rarely; does other Living situation  live with spouse Marital status  married No history of drug/alcohol rehab  Not under pain contract  Tobacco / smoke exposure  12/22/2015: yes  Medication History Vitamin D Active. Omeprazole (20MG  Tablet DR, Oral) Active. Aspirin (81MG  Tablet, 1 (one) Oral) Active. Ketoconazole (2% Cream, External) Active. Citalopram Hydrobromide (20MG  Tablet, Oral) Active. Levothyroxine Sodium (112MCG Tablet, Oral) Active. Ferrous Sulfate Active. Zolpidem Tartrate (10MG  Tab Sublingual, Sublingual) Active. Pravastatin Sodium (40MG  Tablet, Oral) Active. MetFORMIN HCl (500MG  Tablet, Oral) Active. Colcrys (0.6MG  Tablet, Oral) Active. Lisinopril (20MG  Tablet, Oral) Active. Meloxicam (15MG  Tablet, Oral) Active. Hydrocodone-Acetaminophen (7.5-325MG  Tablet, Oral) Active. Furosemide (40MG  Tablet, Oral) Active. Allopurinol (100MG  Tablet, Oral)  Active.  Past Surgical History  Carpal Tunnel Repair  bilateral Dilation and Curettage of Uterus  Gallbladder Surgery  laporoscopic Back Surgery  Date: 50.   Review of Systems General Not Present- Chills, Fatigue, Fever, Memory Loss, Night Sweats, Weight Gain and Weight Loss. Skin Not Present- Eczema, Hives, Itching, Lesions and Rash. HEENT Present- Tinnitus. Not Present- Dentures, Double Vision, Headache, Hearing Loss and Visual Loss. Respiratory Not Present- Allergies, Chronic Cough, Coughing up blood, Shortness of breath at rest and Shortness of breath with exertion. Cardiovascular Present- Murmur. Not Present- Chest Pain, Difficulty Breathing Lying Down, Palpitations, Racing/skipping heartbeats and Swelling. Gastrointestinal Not Present- Abdominal Pain, Bloody Stool, Constipation, Diarrhea, Difficulty Swallowing, Heartburn, Jaundice, Loss of appetitie, Nausea and Vomiting. Female Genitourinary Not Present- Blood in Urine, Discharge, Flank Pain, Incontinence, Painful Urination, Urgency, Urinary frequency, Urinary Retention, Urinating at Night and Weak urinary stream. Musculoskeletal Present- Back Pain, Joint Pain and Morning Stiffness. Not Present- Joint Swelling, Muscle Pain, Muscle Weakness and Spasms. Neurological Not Present- Blackout spells, Difficulty with balance, Dizziness, Paralysis, Tremor and Weakness. Psychiatric Not Present- Insomnia.  Vitals  Weight: 239 lb Height: 68in Weight was reported by patient. Height was reported by patient. Body Surface Area: 2.2 m Body Mass Index: 36.34 kg/m  Pulse: 72 (Regular)  BP: 126/78 (Sitting, Left Arm, Standard)   Physical Exam General Mental Status -Alert, cooperative and good historian. General Appearance-pleasant, Not in acute distress. Orientation-Oriented X3. Build & Nutrition-Well nourished and Well developed.  Head and Neck Head-normocephalic, atraumatic . Neck Global Assessment - supple, no  bruit auscultated on the right, no bruit auscultated on the left.  Eye Pupil - Bilateral-Regular and Round. Motion - Bilateral-EOMI.  Chest and Lung Exam Auscultation Breath sounds - clear at anterior chest wall and clear at posterior chest wall. Adventitious sounds - No Adventitious sounds.  Cardiovascular Auscultation Rhythm - Regular rate and rhythm. Heart Sounds - S1 WNL and S2 WNL. Murmurs & Other Heart Sounds: Murmur 1 - Location - Aortic Area, Pulmonic Area and Sternal Border - Left. Timing - Mid-systolic. Grade - III/VI. Character - Crescendo/Decrescendo. Radiation - Carotids(both carotid arteries).  Abdomen Palpation/Percussion Tenderness - Abdomen is non-tender to palpation. Rigidity (guarding) - Abdomen is soft. Auscultation Auscultation of the abdomen reveals - Bowel sounds normal. Abdominal Bruit - Epigastrium(very faint, likely referred aortic stenotic murmur).  Female Genitourinary Note: Not done, not pertinent to present illness   Musculoskeletal Note: On exam, well-developed female, in no distress. Her left hip can be flexed to 90, no rotation internally, minimal rotation externally and only about 20 degrees abduction. Right hip has normal range of motion. She has significantly antalgic gait pattern.  IMAGING AP pelvis and AP and lateral of the left hip from few months ago, showed she is bone non bone in the hip with essentially zero joint space left.  Assessment & Plan  Primary osteoarthritis of left hip (M16.12)  Note:Surgical Plans: Left Total Hip Replacement - Anterior Approach  Disposition: Home  PCP: Dr. Sallee Lange - Patient has been seen preoperatively and felt to be stable for surgery.  IV TXA  Anesthesia Issues: None  Signed electronically by Ok Edwards, III PA-C

## 2016-03-22 NOTE — Op Note (Signed)
OPERATIVE REPORT- TOTAL HIP ARTHROPLASTY   PREOPERATIVE DIAGNOSIS: Osteoarthritis of the Left hip.   POSTOPERATIVE DIAGNOSIS: Osteoarthritis of the Left  hip.   PROCEDURE: Left total hip arthroplasty, anterior approach.   SURGEON: Gaynelle Arabian, MD   ASSISTANT: Arlee Muslim, PA-C  ANESTHESIA:  General  ESTIMATED BLOOD LOSS:-500 ml    DRAINS: Hemovac x1.   COMPLICATIONS: None   CONDITION: PACU - hemodynamically stable.   BRIEF CLINICAL NOTE: Sabrina Deleon is a 66 y.o. female who has advanced end-  stage arthritis of their Left  hip with progressively worsening pain and  dysfunction.The patient has failed nonoperative management and presents for  total hip arthroplasty.   PROCEDURE IN DETAIL: After successful administration of spinal  anesthetic, the traction boots for the [facility] bed were placed on both  feet and the patient was placed onto the [facility] bed, boots placed into the leg  holders. The Left hip was then isolated from the perineum with plastic  drapes and prepped and draped in the usual sterile fashion. ASIS and  greater trochanter were marked and a oblique incision was made, starting  at about 1 cm lateral and 2 cm distal to the ASIS and coursing towards  the anterior cortex of the femur. The skin was cut with a 10 blade  through subcutaneous tissue to the level of the fascia overlying the  tensor fascia lata muscle. The fascia was then incised in line with the  incision at the junction of the anterior third and posterior 2/3rd. The  muscle was teased off the fascia and then the interval between the TFL  and the rectus was developed. The Hohmann retractor was then placed at  the top of the femoral neck over the capsule. The vessels overlying the  capsule were cauterized and the fat on top of the capsule was removed.  A Hohmann retractor was then placed anterior underneath the rectus  femoris to give exposure to the entire anterior capsule. A T-shaped   capsulotomy was performed. The edges were tagged and the femoral head  was identified.       Osteophytes are removed off the superior acetabulum.  The femoral neck was then cut in situ with an oscillating saw. Traction  was then applied to the left lower extremity utilizing the [facility]  traction. The femoral head was then removed. Retractors were placed  around the acetabulum and then circumferential removal of the labrum was  performed. Osteophytes were also removed. Reaming starts at 45 mm to  medialize and  Increased in 2 mm increments to 47 mm. We reamed in  approximately 40 degrees of abduction, 20 degrees anteversion. A 48 mm  pinnacle acetabular shell was then impacted in anatomic position under  fluoroscopic guidance with excellent purchase. We did not need to place  any additional dome screws. A 28 mm neutral + 4 marathon liner was then  placed into the acetabular shell.       The femoral lift was then placed along the lateral aspect of the femur  just distal to the vastus ridge. The leg was  externally rotated and capsule  was stripped off the inferior aspect of the femoral neck down to the  level of the lesser trochanter, this was done with electrocautery. The femur was lifted after this was performed. The  leg was then placed in an extended and adducted position essentially delivering the femur. We also removed the capsule superiorly and the piriformis from the piriformis  fossa to gain excellent exposure of the  proximal femur. Rongeur was used to remove some cancellous bone to get  into the lateral portion of the proximal femur for placement of the  initial starter reamer. The starter broaches was placed  the starter broach  and was shown to go down the center of the canal. Broaching  with the  Corail system was then performed starting at size 8, coursing  Up to size 11. A size 11 had excellent torsional and rotational  and axial stability. The trial high offset neck was then  placed  with a 28 + 1.5 trial head. The hip was then reduced. We confirmed that  the stem was in the canal both on AP and lateral x-rays. It also has excellent sizing. The hip was reduced with outstanding stability through full extension and full external rotation.. AP pelvis was taken and the leg lengths were measured and found to be equal. Hip was then dislocated again and the femoral head and neck removed. The  femoral broach was removed. Size 11 Corail stem with a high offset  neck was then impacted into the femur following native anteversion. Has  excellent purchase in the canal. Excellent torsional and rotational and  axial stability. It is confirmed to be in the canal on AP and lateral  fluoroscopic views. The 28 + 1.5 ceramic head was placed and the hip  reduced with outstanding stability. Again AP pelvis was taken and it  confirmed that the leg lengths were equal. The wound was then copiously  irrigated with saline solution and the capsule reattached and repaired  with Ethibond suture. 30 ml of .25% Bupivicaine was  injected into the capsule and into the edge of the tensor fascia lata as well as subcutaneous tissue. The fascia overlying the tensor fascia lata was then closed with a running #1 V-Loc. Subcu was closed with interrupted 2-0 Vicryl and subcuticular running 4-0 Monocryl. Incision was cleaned  and dried. Steri-Strips and a bulky sterile dressing applied. Hemovac  drain was hooked to suction and then the patient was awakened and transported to  recovery in stable condition.        Please note that a surgical assistant was a medical necessity for this procedure to perform it in a safe and expeditious manner. Assistant was necessary to provide appropriate retraction of vital neurovascular structures and to prevent femoral fracture and allow for anatomic placement of the prosthesis.  Gaynelle Arabian, M.D.

## 2016-03-22 NOTE — Anesthesia Procedure Notes (Signed)
Procedure Name: Intubation Date/Time: 03/22/2016 1:30 PM Performed by: Anne Fu Pre-anesthesia Checklist: Patient identified, Emergency Drugs available, Suction available and Patient being monitored Patient Re-evaluated:Patient Re-evaluated prior to inductionOxygen Delivery Method: Circle system utilized Preoxygenation: Pre-oxygenation with 100% oxygen Intubation Type: IV induction Ventilation: Mask ventilation without difficulty Laryngoscope Size: Mac and 4 Grade View: Grade II Tube type: Oral Number of attempts: 1 Airway Equipment and Method: Stylet and Oral airway Placement Confirmation: ETT inserted through vocal cords under direct vision,  positive ETCO2 and breath sounds checked- equal and bilateral Secured at: 21 cm Tube secured with: Tape Dental Injury: Teeth and Oropharynx as per pre-operative assessment  Comments: Performed by Arnoldo Hooker, CRNA

## 2016-03-22 NOTE — Anesthesia Preprocedure Evaluation (Addendum)
Anesthesia Evaluation  Patient identified by MRN, date of birth, ID band Patient awake    Reviewed: Allergy & Precautions, NPO status , Patient's Chart, lab work & pertinent test results  Airway Mallampati: I  TM Distance: >3 FB Neck ROM: Full    Dental  (+) Caps, Missing, Dental Advisory Given   Pulmonary former smoker,    Pulmonary exam normal breath sounds clear to auscultation       Cardiovascular hypertension, Pt. on medications Normal cardiovascular exam+ Valvular Problems/Murmurs AS  Rhythm:Regular Rate:Normal     Neuro/Psych negative neurological ROS  negative psych ROS   GI/Hepatic GERD  Controlled and Medicated,  Endo/Other  diabetes, Well Controlled, Type 2, Oral Hypoglycemic AgentsHypothyroidism Obesity  Renal/GU Renal InsufficiencyRenal disease  negative genitourinary   Musculoskeletal  (+) Arthritis , Osteoarthritis,    Abdominal (+) + obese,   Peds  Hematology  (+) anemia ,   Anesthesia Other Findings   Reproductive/Obstetrics                             Lab Results  Component Value Date   WBC 6.7 03/15/2016   HGB 11.4 (L) 03/15/2016   HCT 34.6 (L) 03/15/2016   MCV 87.4 03/15/2016   PLT 233 03/15/2016     Chemistry      Component Value Date/Time   NA 139 03/15/2016 1004   NA 138 02/24/2016 0833   K 4.9 03/15/2016 1004   CL 102 03/15/2016 1004   CO2 29 03/15/2016 1004   BUN 32 (H) 03/15/2016 1004   BUN 32 (H) 02/24/2016 0833   CREATININE 1.57 (H) 03/15/2016 1004   CREATININE 1.35 (H) 05/08/2014 0751      Component Value Date/Time   CALCIUM 9.9 03/15/2016 1004   ALKPHOS 56 03/15/2016 1004   AST 38 03/15/2016 1004   ALT 19 03/15/2016 1004   BILITOT 0.5 03/15/2016 1004   BILITOT 0.6 02/24/2016 0833     EKG: normal sinus rhythm, LAD. Echo:  - Left ventricle: The cavity size was normal. Wall thickness was   increased in a pattern of mild LVH. Systolic  function was   vigorous. The estimated ejection fraction was in the range of 65%   to 70%. Doppler parameters are consistent with abnormal left   ventricular relaxation (grade 1 diastolic dysfunction). Doppler   parameters are consistent with high ventricular filling pressure. - Aortic valve: Mildly to moderately calcified annulus. Trileaflet;   moderately thickened leaflets. There was moderate stenosis. Mean   gradient (S): 27 mm Hg. Valve area (VTI): 1.11 cm^2. Valve area   (Vmax): 1.11 cm^2. Valve area (Vmean): 1.03 cm^2. - Mitral valve: Mildly to moderately calcified annulus. Mildly   thickened leaflets . - Atrial septum: No defect or patent foramen ovale was identified. - Technically adequate study. Anesthesia Physical Anesthesia Plan  ASA: III  Anesthesia Plan: General   Post-op Pain Management:    Induction: Intravenous, Rapid sequence and Cricoid pressure planned  Airway Management Planned: Oral ETT  Additional Equipment: Arterial line  Intra-op Plan:   Post-operative Plan: Extubation in OR  Informed Consent: I have reviewed the patients History and Physical, chart, labs and discussed the procedure including the risks, benefits and alternatives for the proposed anesthesia with the patient or authorized representative who has indicated his/her understanding and acceptance.   Dental advisory given  Plan Discussed with: CRNA, Anesthesiologist and Surgeon  Anesthesia Plan Comments:  Anesthesia Quick Evaluation  

## 2016-03-23 ENCOUNTER — Encounter (HOSPITAL_COMMUNITY): Payer: Self-pay | Admitting: Orthopedic Surgery

## 2016-03-23 LAB — GLUCOSE, CAPILLARY
GLUCOSE-CAPILLARY: 150 mg/dL — AB (ref 65–99)
GLUCOSE-CAPILLARY: 139 mg/dL — AB (ref 65–99)
Glucose-Capillary: 193 mg/dL — ABNORMAL HIGH (ref 65–99)
Glucose-Capillary: 172 mg/dL — ABNORMAL HIGH (ref 65–99)

## 2016-03-23 LAB — CBC
HEMATOCRIT: 28 % — AB (ref 36.0–46.0)
Hemoglobin: 9.3 g/dL — ABNORMAL LOW (ref 12.0–15.0)
MCH: 29.1 pg (ref 26.0–34.0)
MCHC: 33.2 g/dL (ref 30.0–36.0)
MCV: 87.5 fL (ref 78.0–100.0)
PLATELETS: 184 10*3/uL (ref 150–400)
RBC: 3.2 MIL/uL — ABNORMAL LOW (ref 3.87–5.11)
RDW: 14.6 % (ref 11.5–15.5)
WBC: 8.1 10*3/uL (ref 4.0–10.5)

## 2016-03-23 LAB — BASIC METABOLIC PANEL
Anion gap: 8 (ref 5–15)
Anion gap: 10 (ref 5–15)
BUN: 24 mg/dL — AB (ref 6–20)
BUN: 25 mg/dL — AB (ref 6–20)
CALCIUM: 9.1 mg/dL (ref 8.9–10.3)
CALCIUM: 9.1 mg/dL (ref 8.9–10.3)
CO2: 27 mmol/L (ref 22–32)
CO2: 24 mmol/L (ref 22–32)
CREATININE: 1.41 mg/dL — AB (ref 0.44–1.00)
CREATININE: 1.7 mg/dL — AB (ref 0.44–1.00)
Chloride: 101 mmol/L (ref 101–111)
Chloride: 99 mmol/L — ABNORMAL LOW (ref 101–111)
GFR calc Af Amer: 45 mL/min — ABNORMAL LOW (ref >60)
GFR calc non Af Amer: 31 mL/min — ABNORMAL LOW (ref >60)
GFR, EST AFRICAN AMERICAN: 36 mL/min — AB (ref >60)
GFR, EST NON AFRICAN AMERICAN: 38 mL/min — AB (ref >60)
GLUCOSE: 245 mg/dL — AB (ref 65–99)
Glucose, Bld: 154 mg/dL — ABNORMAL HIGH (ref 65–99)
Potassium: 5.2 mmol/L — ABNORMAL HIGH (ref 3.5–5.1)
Potassium: 4 mmol/L (ref 3.5–5.1)
SODIUM: 133 mmol/L — AB (ref 135–145)
Sodium: 136 mmol/L (ref 135–145)

## 2016-03-23 MED ORDER — OMEPRAZOLE 20 MG PO CPDR
20.0000 mg | DELAYED_RELEASE_CAPSULE | Freq: Every day | ORAL | Status: DC
  Administered 2016-03-24: 09:00:00 20 mg via ORAL
  Filled 2016-03-23: qty 1

## 2016-03-23 NOTE — Discharge Instructions (Addendum)
° °Dr. Frank Aluisio °Total Joint Specialist °[facility] °[address] °[place], [place] [address] °[phone] ° °ANTERIOR APPROACH TOTAL HIP REPLACEMENT POSTOPERATIVE DIRECTIONS ° ° °Hip Rehabilitation, Guidelines Following Surgery  °The results of a hip operation are greatly improved after range of motion and muscle strengthening exercises. Follow all safety measures which are given to protect your hip. If any of these exercises cause increased pain or swelling in your joint, decrease the amount until you are comfortable again. Then slowly increase the exercises. Call your caregiver if you have problems or questions.  ° °HOME CARE INSTRUCTIONS  °Remove items at home which could result in a fall. This includes throw rugs or furniture in walking pathways.  °· ICE to the affected hip every three hours for 30 minutes at a time and then as needed for pain and swelling.  Continue to use ice on the hip for pain and swelling from surgery. You may notice swelling that will progress down to the foot and ankle.  This is normal after surgery.  Elevate the leg when you are not up walking on it.   °· Continue to use the breathing machine which will help keep your temperature down.  It is common for your temperature to cycle up and down following surgery, especially at night when you are not up moving around and exerting yourself.  The breathing machine keeps your lungs expanded and your temperature down. ° ° °DIET °You may resume your previous home diet once your are discharged from the hospital. ° °DRESSING / WOUND CARE / SHOWERING °You may shower 3 days after surgery, but keep the wounds dry during showering.  You may use an occlusive plastic wrap (Press'n Seal for example), NO SOAKING/SUBMERGING IN THE BATHTUB.  If the bandage gets wet, change with a clean dry gauze.  If the incision gets wet, pat the wound dry with a clean towel. °You may start showering once you are discharged home but do not  submerge the incision under water. Just pat the incision dry and apply a dry gauze dressing on daily. °Change the surgical dressing daily and reapply a dry dressing each time. ° °ACTIVITY °Walk with your walker as instructed. °Use walker as long as suggested by your caregivers. °Avoid periods of inactivity such as sitting longer than an hour when not asleep. This helps prevent blood clots.  °You may resume a sexual relationship in one month or when given the OK by your doctor.  °You may return to work once you are cleared by your doctor.  °Do not drive a car for 6 weeks or until released by you surgeon.  °Do not drive while taking narcotics. ° °WEIGHT BEARING °Weight bearing as tolerated with assist device (walker, cane, etc) as directed, use it as long as suggested by your surgeon or therapist, typically at least 4-6 weeks. ° °POSTOPERATIVE CONSTIPATION PROTOCOL °Constipation - defined medically as fewer than three stools per week and severe constipation as less than one stool per week. ° °One of the most common issues patients have following surgery is constipation.  Even if you have a regular bowel pattern at home, your normal regimen is likely to be disrupted due to multiple reasons following surgery.  Combination of anesthesia, postoperative narcotics, change in appetite and fluid intake all can affect your bowels.  In order to avoid complications following surgery, here are some recommendations in order to help you during your recovery period. ° °Colace (docusate) - Pick up an over-the-counter   form of Colace or another stool softener and take twice a day as long as you are requiring postoperative pain medications.  Take with a full glass of water daily.  If you experience loose stools or diarrhea, hold the colace until you stool forms back up.  If your symptoms do not get better within 1 week or if they get worse, check with your doctor. ° °Dulcolax (bisacodyl) - Pick up over-the-counter and take as directed  by the product packaging as needed to assist with the movement of your bowels.  Take with a full glass of water.  Use this product as needed if not relieved by Colace only.  ° °MiraLax (polyethylene glycol) - Pick up over-the-counter to have on hand.  MiraLax is a solution that will increase the amount of water in your bowels to assist with bowel movements.  Take as directed and can mix with a glass of water, juice, soda, coffee, or tea.  Take if you go more than two days without a movement. °Do not use MiraLax more than once per day. Call your doctor if you are still constipated or irregular after using this medication for 7 days in a row. ° °If you continue to have problems with postoperative constipation, please contact the office for further assistance and recommendations.  If you experience "the worst abdominal pain ever" or develop nausea or vomiting, please contact the office immediatly for further recommendations for treatment. ° °ITCHING ° If you experience itching with your medications, try taking only a single pain pill, or even half a pain pill at a time.  You can also use Benadryl over the counter for itching or also to help with sleep.  ° °TED HOSE STOCKINGS °Wear the elastic stockings on both legs for three weeks following surgery during the day but you may remove then at night for sleeping. ° °MEDICATIONS °See your medication summary on the “After Visit Summary” that the nursing staff will review with you prior to discharge.  You may have some home medications which will be placed on hold until you complete the course of blood thinner medication.  It is important for you to complete the blood thinner medication as prescribed by your surgeon.  Continue your approved medications as instructed at time of discharge. ° °PRECAUTIONS °If you experience chest pain or shortness of breath - call 911 immediately for transfer to the hospital emergency department.  °If you develop a fever greater that 101 F,  purulent drainage from wound, increased redness or drainage from wound, foul odor from the wound/dressing, or calf pain - CONTACT YOUR SURGEON.   °                                                °FOLLOW-UP APPOINTMENTS °Make sure you keep all of your appointments after your operation with your surgeon and caregivers. You should call the office at the above phone number and make an appointment for approximately two weeks after the date of your surgery or on the date instructed by your surgeon outlined in the "After Visit Summary". ° °RANGE OF MOTION AND STRENGTHENING EXERCISES  °These exercises are designed to help you keep full movement of your hip joint. Follow your caregiver's or physical therapist's instructions. Perform all exercises about fifteen times, three times per day or as directed. Exercise both hips, even if you   have had only one joint replacement. These exercises can be done on a training (exercise) mat, on the floor, on a table or on a bed. Use whatever works the best and is most comfortable for you. Use music or television while you are exercising so that the exercises are a pleasant break in your day. This will make your life better with the exercises acting as a break in routine you can look forward to.  Lying on your back, slowly slide your foot toward your buttocks, raising your knee up off the floor. Then slowly slide your foot back down until your leg is straight again.  Lying on your back spread your legs as far apart as you can without causing discomfort.  Lying on your side, raise your upper leg and foot straight up from the floor as far as is comfortable. Slowly lower the leg and repeat.  Lying on your back, tighten up the muscle in the front of your thigh (quadriceps muscles). You can do this by keeping your leg straight and trying to raise your heel off the floor. This helps strengthen the largest muscle supporting your knee.  Lying on your back, tighten up the muscles of your  buttocks both with the legs straight and with the knee bent at a comfortable angle while keeping your heel on the floor.   IF YOU ARE TRANSFERRED TO A SKILLED REHAB FACILITY If the patient is transferred to a skilled rehab facility following release from the hospital, a list of the current medications will be sent to the facility for the patient to continue.  When discharged from the skilled rehab facility, please have the facility set up the patient's [place] prior to being released. Also, the skilled facility will be responsible for providing the patient with their medications at time of release from the facility to include their pain medication, the muscle relaxants, and their blood thinner medication. If the patient is still at the rehab facility at time of the two week follow up appointment, the skilled rehab facility will also need to assist the patient in arranging follow up appointment in our office and any transportation needs.  MAKE SURE YOU:  Understand these instructions.  Get help right away if you are not doing well or get worse.    Pick up stool softner and laxative for home use following surgery while on pain medications. Do not submerge incision under water. Please use good hand washing techniques while changing dressing each day. May shower starting three days after surgery. Please use a clean towel to pat the incision dry following showers. Continue to use ice for pain and swelling after surgery. Do not use any lotions or creams on the incision until instructed by your surgeon.  Take Eliquis twice a day for two and a half more weeks, then discontinue Eliquis. Once the patient has completed the Eliquis, they may resume the 81 mg Aspirin.    Information on my medicine - ELIQUIS (apixaban)  This medication education was reviewed with me or my healthcare representative as part of my discharge preparation.  The pharmacist that spoke with me during my  hospital stay was:  Schul[place]  Why was Eliquis prescribed for you? Eliquis was prescribed for you to reduce the risk of blood clots forming after orthopedic surgery.    What do You need to know about Eliquis? Take your Eliquis TWICE DAILY - one tablet in the morning and one tablet in the evening with  or without food.  It would be best to take the dose about the same time each day.  If you have difficulty swallowing the tablet whole please discuss with your pharmacist how to take the medication safely.  Take Eliquis exactly as prescribed by your doctor and DO NOT stop taking Eliquis without talking to the doctor who prescribed the medication.  Stopping without other medication to take the place of Eliquis may increase your risk of developing a clot.  After discharge, you should have regular check-up appointments with your healthcare provider that is prescribing your Eliquis.  What do you do if you miss a dose? If a dose of ELIQUIS is not taken at the scheduled time, take it as soon as possible on the same day and twice-daily administration should be resumed.  The dose should not be doubled to make up for a missed dose.  Do not take more than one tablet of ELIQUIS at the same time.  Important Safety Information A possible side effect of Eliquis is bleeding. You should call your healthcare provider right away if you experience any of the following: ? Bleeding from an injury or your nose that does not stop. ? Unusual colored urine (red or dark Uber) or unusual colored stools (red or black). ? Unusual bruising for unknown reasons. ? A serious fall or if you hit your head (even if there is no bleeding).  Some medicines may interact with Eliquis and might increase your risk of bleeding or clotting while on Eliquis. To help avoid this, consult your healthcare provider or pharmacist prior to using any new prescription or non-prescription medications, including herbals, vitamins,  non-steroidal anti-inflammatory drugs (NSAIDs) and supplements.  This website has more information on Eliquis (apixaban): http://[email or web address].com/eliquis/home

## 2016-03-23 NOTE — Evaluation (Signed)
Occupational Therapy Evaluation Patient Details Name: Sabrina Deleon MRN: [id number] DOB: 03/12/1951 Today's Date: 03/23/2016    History of Present Illness s/p L DA THA   Clinical Impression   This 66 year old female was admitted for the above sx. She was independent with adls prior to admission and currently needs up to max A for LB adls and min A for SPT. She will benefit from continued OT in acute to further assess DME needs for bathroom, and to further educate on these transfers    Follow Up Recommendations  Supervision/Assistance - 24 hour    Equipment Recommendations   (to be further assessed: has comfort height commode)    Recommendations for Other Services       Precautions / Restrictions Precautions Precautions: Fall Restrictions Weight Bearing Restrictions: No Other Position/Activity Restrictions: WBAT      Mobility Bed Mobility Overal bed mobility: Needs Assistance Bed Mobility: Supine to Sit     Supine to sit: Mod assist     General bed mobility comments: NT   Transfers Overall transfer level: Needs assistance Equipment used: None Transfers: Sit to/from Stand Sit to Stand: Min assist         General transfer comment: cues for LE management and use of UEs to self assist    Balance Overall balance assessment: No apparent balance deficits (not formally assessed)                                          ADL Overall ADL's : Needs assistance/impaired             Lower Body Bathing: Moderate assistance;Sit to/from stand       Lower Body Dressing: Maximal assistance;Sit to/from stand   Toilet Transfer: Minimal assistance;Stand-pivot;RW (to chair)             General ADL Comments: this was pt's first time moving.  She is able to perform UB adls with set up. Husband will assist her with adls at home:  also explained use of reacher, if she wants to be more independent.  Pt still had catheter in place     Vision      Perception     Praxis      Pertinent Vitals/Pain Pain Assessment: 0-10 Pain Score: 3  Faces Pain Scale: Hurts a little bit Pain Location: L hip Pain Descriptors / Indicators: Sore Pain Intervention(s): Limited activity within patient's tolerance;Monitored during session;Premedicated before session;Ice applied     Hand Dominance     Extremity/Trunk Assessment Upper Extremity Assessment Upper Extremity Assessment: Overall WFL for tasks assessed      Cervical / Trunk Assessment Cervical / Trunk Assessment: Normal   Communication Communication Communication: No difficulties   Cognition Arousal/Alertness: Awake/alert Behavior During Therapy: WFL for tasks assessed/performed Overall Cognitive Status: Within Functional Limits for tasks assessed                     General Comments       Exercises      Shoulder Instructions      Home Living Family/patient expects to be discharged to:: Private residence Living Arrangements: Spouse/significant other Available Help at Discharge: Family Type of Home: Mobile home Home Access: Stairs to enter Entrance Stairs-Number of Steps: 4 Entrance Stairs-Rails: Right Home Layout: One level     Bathroom Shower/Tub: Occupational psychologist:  Handicapped height     Home Equipment: Shower seat - built in;Walker - standard;Cane - single point;Cane - quad          Prior Functioning/Environment Level of Independence: Independent                 OT Problem List: Decreased activity tolerance;Pain;Decreased knowledge of use of DME or AE;Decreased strength   OT Treatment/Interventions: Self-care/ADL training;DME and/or AE instruction;Patient/family education    OT Goals(Current goals can be found in the care plan section) Acute Rehab OT Goals Patient Stated Goal: Regain IND and walk without pain OT Goal Formulation: With patient Time For Goal Achievement: 03/30/16 Potential to Achieve Goals: Good ADL  Goals Pt Will Transfer to Toilet: with supervision;ambulating;bedside commode (vs high commode) Pt Will Perform Tub/Shower Transfer: Shower transfer;with min guard assist;ambulating;3 in 1;shower seat Additional ADL Goal #1: pt will verbalize use of AE for ADLs if needed  OT Frequency: Min 2X/week   Barriers to D/C:            Co-evaluation              End of Session    Activity Tolerance: Patient tolerated treatment well Patient left: in chair;with call bell/phone within reach;with family/visitor present   Time: [id number] OT Time Calculation (min): 30 min Charges:  OT General Charges $OT Visit: 1 Procedure OT Evaluation $OT Eval Low Complexity: 1 Procedure OT Treatments $Therapeutic Activity: 8-22 mins G-Codes:    Anibal Quinby 04-16-16, 1:15 PM  Lesle Chris, OTR/L [phone] Apr 16, 2016

## 2016-03-23 NOTE — Care Management Note (Signed)
Case Management Note  Patient Details  Name: Sabrina Deleon MRN: [id number] Date of Birth: 01/21/1951  Subjective/Objective:                  LEFT TOTAL HIP ARTHROPLASTY ANTERIOR APPROACH (Left) Action/Plan: Discharge planning Expected Discharge Date:  03/23/16               Expected Discharge Plan:  [place]  In-House Referral:     Discharge planning Services  CM Consult  Post Acute Care Choice:  Home Health Choice offered to:  Patient  DME Arranged:  N/A DME Agency:  NA  HH Arranged:  PT [place] Agency:  [facility] ([facility])  Status of Service:  Completed, signed off  If discussed at [employer], dates discussed:    Additional Comments: CM met with pt in room to offer choice of home health agency. Pt chooses [facility] to render HHPT. Referral given to [facility] rep, Tim.  Pt states she has all the DME needed at home.  NO other CM needs were communicated. Dellie Catholic, RN 03/23/2016, 11:33 AM

## 2016-03-23 NOTE — Evaluation (Signed)
Physical Therapy Evaluation Patient Details Name: Sabrina Deleon MRN: [id number] DOB: 1950/04/29 Today's Date: 03/23/2016   History of Present Illness  s/p L DA THA  Clinical Impression  Pt s/p L THR and presents with decreased L LE strength/ROM and post op pain limiting functional mobility.  Pt should progress to dc home with family assist and HHPT follow up.    Follow Up Recommendations Home health PT    Equipment Recommendations  None recommended by PT    Recommendations for Other Services OT consult     Precautions / Restrictions Precautions Precautions: Fall Restrictions Weight Bearing Restrictions: No Other Position/Activity Restrictions: WBAT      Mobility  Bed Mobility Overal bed mobility: Needs Assistance Bed Mobility: Supine to Sit     Supine to sit: Mod assist     General bed mobility comments: NT   Transfers Overall transfer level: Needs assistance Equipment used: None Transfers: Sit to/from Stand Sit to Stand: Min assist         General transfer comment: cues for LE management and use of UEs to self assist  Ambulation/Gait Ambulation/Gait assistance: Min assist;Min guard Ambulation Distance (Feet): 140 Feet Assistive device: Standard walker Gait Pattern/deviations: Step-to pattern;Decreased step length - right;Decreased step length - left;Shuffle;Trunk flexed Gait velocity: decr Gait velocity interpretation: Below normal speed for age/gender General Gait Details: cues for sequence, posture and position from [employer]            Wheelchair Mobility    Modified Rankin (Stroke Patients Only)       Balance Overall balance assessment: No apparent balance deficits (not formally assessed)                                           Pertinent Vitals/Pain Pain Assessment: 0-10 Pain Score: 3  Faces Pain Scale: Hurts a little bit Pain Location: L hip Pain Descriptors / Indicators: Sore Pain Intervention(s): Limited  activity within patient's tolerance;Monitored during session;Premedicated before session;Ice applied    Home Living Family/patient expects to be discharged to:: Private residence Living Arrangements: Spouse/significant other Available Help at Discharge: Family Type of Home: Mobile home Home Access: Stairs to enter Entrance Stairs-Rails: Right Entrance Stairs-Number of Steps: 4 Home Layout: One level Home Equipment: Shower seat - built in;Walker - standard;Cane - single point;Cane - quad      Prior Function Level of Independence: Independent               Hand Dominance        Extremity/Trunk Assessment   Upper Extremity Assessment Upper Extremity Assessment: Overall WFL for tasks assessed    Lower Extremity Assessment Lower Extremity Assessment: LLE deficits/detail LLE Deficits / Details: Strength at hip 2+/5 with AAROM at hip to 75 flex and 15 abd    Cervical / Trunk Assessment Cervical / Trunk Assessment: Normal  Communication   Communication: No difficulties  Cognition Arousal/Alertness: Awake/alert Behavior During Therapy: WFL for tasks assessed/performed Overall Cognitive Status: Within Functional Limits for tasks assessed                      General Comments      Exercises Total Joint Exercises Ankle Circles/Pumps: AROM;Both;Supine;20 reps Quad Sets: AROM;Both;10 reps;Supine Heel Slides: AAROM;Left;20 reps;Supine Hip ABduction/ADduction: AAROM;Left;15 reps;Supine   Assessment/Plan    PT Assessment Patient needs continued PT services  PT Problem List  Decreased strength;Decreased range of motion;Decreased activity tolerance;Decreased mobility;Decreased knowledge of use of DME;Obesity;Pain          PT Treatment Interventions DME instruction;Gait training;Stair training;Functional mobility training;Therapeutic activities;Therapeutic exercise;Patient/family education    PT Goals (Current goals can be found in the Care Plan section)  Acute  Rehab PT Goals Patient Stated Goal: Regain IND and walk without pain PT Goal Formulation: With patient Time For Goal Achievement: 03/25/16 Potential to Achieve Goals: Good    Frequency 7X/week   Barriers to discharge        Co-evaluation               End of Session Equipment Utilized During Treatment: Gait belt Activity Tolerance: Patient tolerated treatment well Patient left: in chair;with call bell/phone within reach;with family/visitor present Nurse Communication: Mobility status         Time: [id number] PT Time Calculation (min) (ACUTE ONLY): 38 min   Charges:   PT Evaluation $PT Eval Low Complexity: 1 Procedure PT Treatments $Gait Training: 8-22 mins $Therapeutic Exercise: 8-22 mins   PT G Codes:        Osby Sweetin 04-04-2016, 12:54 PM

## 2016-03-23 NOTE — Progress Notes (Addendum)
   Subjective: 1 Day Post-Op Procedure(s) (LRB): LEFT TOTAL HIP ARTHROPLASTY ANTERIOR APPROACH (Left) Patient reports pain as mild.   Patient seen in rounds by Dr. Wynelle Link. Patient is well, but has had some minor complaints of pain in the hip, requiring pain medications We will start therapy today.  Plan is to go Home after hospital stay.  Plan home tomorrow.  Objective: Vital signs in last 24 hours: Temp:  [97.8 F (36.6 C)-98.8 F (37.1 C)] 98.4 F (36.9 C) (01/04 0538) Pulse Rate:  [69-97] 69 (01/04 0538) Resp:  [11-18] 16 (01/04 0538) BP: (133-165)/(66-90) 137/66 (01/04 0538) SpO2:  [97 %-100 %] 99 % (01/04 0538) Arterial Line BP: (160-186)/(66-80) 160/66 (01/03 1615) Weight:  [107.5 kg (237 lb)] 107.5 kg (237 lb) (01/03 1815)  Intake/Output from previous day:  Intake/Output Summary (Last 24 hours) at 03/23/16 0904 Last data filed at 03/23/16 0800  Gross per 24 hour  Intake             3275 ml  Output             2455 ml  Net              820 ml    Intake/Output this shift: Total I/O In: 360 [P.O.:360] Out: -   Labs:  Recent Labs  03/23/16 0431  HGB 9.3*    Recent Labs  03/23/16 0431  WBC 8.1  RBC 3.20*  HCT 28.0*  PLT 184    Recent Labs  03/23/16 0431  NA 136  K 5.2*  CL 101  CO2 27  BUN 24*  CREATININE 1.41*  GLUCOSE 245*  CALCIUM 9.1   No results for input(s): LABPT, INR in the last 72 hours.  EXAM General - Patient is Alert, Appropriate and Oriented Extremity - Neurovascular intact Sensation intact distally Intact pulses distally Dorsiflexion/Plantar flexion intact Dressing - dressing C/D/I Motor Function - intact, moving foot and toes well on exam.  Hemovac pulled without difficulty.  Past Medical History:  Diagnosis Date  . Aortic stenosis    Dr. Domenic Polite follows- LOV notes 02-02-16 Epic.  . Arthritis   . Chronic kidney disease    Dr. Tami Ribas- noted increase kidney function levels- took pt off Metformin and started on  Onglyza for Blood sugar control-pt being followed for this.  Marland Kitchen GERD (gastroesophageal reflux disease)   . Gout   . Hypertension   . Hypothyroidism   . Type 2 diabetes mellitus ([place])   . Wears glasses   . Wears partial dentures    Upper    Assessment/Plan: 1 Day Post-Op Procedure(s) (LRB): LEFT TOTAL HIP ARTHROPLASTY ANTERIOR APPROACH (Left) Principal Problem:   OA (osteoarthritis) of hip  Estimated body mass index is 35 kg/m as calculated from the following:   Height as of this encounter: 5\' 9"  (1.753 m).   Weight as of this encounter: 107.5 kg (237 lb). Advance diet Up with therapy Plan for discharge tomorrow  DVT Prophylaxis - Eliquis Weight Bearing As Tolerated left Leg Hemovac Pulled Begin Therapy  HGB - 9.3 K+ - 5.2, ? Hemolysis - Recheck BMET today BUN - 24 - Known CKD Creat - 1.41 - Known CKD BUN and Creat are both better than preop  Arlee Muslim, PA-C Orthopaedic Surgery 03/23/2016, 9:04 AM

## 2016-03-23 NOTE — Progress Notes (Signed)
Physical Therapy Treatment Patient Details Name: Sabrina Deleon MRN: [id number] DOB: 1950/04/07 Today's Date: 03/23/2016    History of Present Illness s/p L DA THA    PT Comments    Pt progressing well with mobility.  Pt ambulated in hall, reviewed bed mobility and assisted with donning underwear.  Follow Up Recommendations  Home health PT     Equipment Recommendations  None recommended by PT    Recommendations for Other Services OT consult     Precautions / Restrictions Precautions Precautions: Fall Restrictions Weight Bearing Restrictions: No Other Position/Activity Restrictions: WBAT    Mobility  Bed Mobility Overal bed mobility: Needs Assistance Bed Mobility: Sit to Supine     Supine to sit: Mod assist Sit to supine: Min assist   General bed mobility comments: cues for sequence and use of R LE to self assist  Transfers Overall transfer level: Needs assistance Equipment used: None Transfers: Sit to/from Stand Sit to Stand: Min assist         General transfer comment: cues for LE management and use of UEs to self assist  Ambulation/Gait Ambulation/Gait assistance: Min assist;Min guard Ambulation Distance (Feet): 160 Feet Assistive device: Standard walker Gait Pattern/deviations: Step-to pattern;Decreased step length - right;Decreased step length - left;Shuffle;Trunk flexed Gait velocity: decr Gait velocity interpretation: Below normal speed for age/gender General Gait Details: cues for sequence, posture and position from [employer]            Wheelchair Mobility    Modified Rankin (Stroke Patients Only)       Balance Overall balance assessment: No apparent balance deficits (not formally assessed)                                  Cognition Arousal/Alertness: Awake/alert Behavior During Therapy: WFL for tasks assessed/performed Overall Cognitive Status: Within Functional Limits for tasks assessed                       Exercises Total Joint Exercises Ankle Circles/Pumps: AROM;Both;Supine;20 reps Quad Sets: AROM;Both;10 reps;Supine Heel Slides: AAROM;Left;20 reps;Supine Hip ABduction/ADduction: AAROM;Left;15 reps;Supine    General Comments        Pertinent Vitals/Pain Pain Assessment: 0-10 Pain Score: 4  Faces Pain Scale: Hurts a little bit Pain Location: L hip Pain Descriptors / Indicators: Sore Pain Intervention(s): Limited activity within patient's tolerance;Monitored during session;Premedicated before session;Ice applied    Home Living Family/patient expects to be discharged to:: Private residence Living Arrangements: Spouse/significant other Available Help at Discharge: Family Type of Home: Mobile home Home Access: Stairs to enter Entrance Stairs-Rails: Right Home Layout: One level Home Equipment: Shower seat - built in;Walker - standard;Cane - single point;Cane - quad      Prior Function Level of Independence: Independent          PT Goals (current goals can now be found in the care plan section) Acute Rehab PT Goals Patient Stated Goal: Regain IND and walk without pain PT Goal Formulation: With patient Time For Goal Achievement: 03/25/16 Potential to Achieve Goals: Good Progress towards PT goals: Progressing toward goals    Frequency    7X/week      PT Plan Current plan remains appropriate    Co-evaluation             End of Session Equipment Utilized During Treatment: Gait belt Activity Tolerance: Patient tolerated treatment well Patient left: in bed;with call  bell/phone within reach;with family/visitor present     Time: 1353-1420 PT Time Calculation (min) (ACUTE ONLY): 27 min  Charges:  $Gait Training: 8-22 mins $Therapeutic Exercise: 8-22 mins $Therapeutic Activity: 8-22 mins                    G Codes:      Shakeel Disney March 29, 2016, 2:27 PM

## 2016-03-24 LAB — BASIC METABOLIC PANEL
Anion gap: 10 (ref 5–15)
BUN: 26 mg/dL — ABNORMAL HIGH (ref 6–20)
CHLORIDE: 100 mmol/L — AB (ref 101–111)
CO2: 29 mmol/L (ref 22–32)
Calcium: 9.3 mg/dL (ref 8.9–10.3)
Creatinine, Ser: 1.5 mg/dL — ABNORMAL HIGH (ref 0.44–1.00)
GFR calc Af Amer: 41 mL/min — ABNORMAL LOW (ref >60)
GFR, EST NON AFRICAN AMERICAN: 36 mL/min — AB (ref >60)
Glucose, Bld: 163 mg/dL — ABNORMAL HIGH (ref 65–99)
POTASSIUM: 4.2 mmol/L (ref 3.5–5.1)
SODIUM: 139 mmol/L (ref 135–145)

## 2016-03-24 LAB — GLUCOSE, CAPILLARY: Glucose-Capillary: 158 mg/dL — ABNORMAL HIGH (ref 65–99)

## 2016-03-24 LAB — CBC
HCT: 28.3 % — ABNORMAL LOW (ref 36.0–46.0)
HEMOGLOBIN: 9.4 g/dL — AB (ref 12.0–15.0)
MCH: 29.5 pg (ref 26.0–34.0)
MCHC: 33.2 g/dL (ref 30.0–36.0)
MCV: 88.7 fL (ref 78.0–100.0)
PLATELETS: 202 10*3/uL (ref 150–400)
RBC: 3.19 MIL/uL — AB (ref 3.87–5.11)
RDW: 14.7 % (ref 11.5–15.5)
WBC: 10.8 10*3/uL — ABNORMAL HIGH (ref 4.0–10.5)

## 2016-03-24 MED ORDER — OXYCODONE HCL 5 MG PO TABS: 5.0000 mg | ORAL_TABLET | ORAL | 0 refills | Status: DC | PRN

## 2016-03-24 MED ORDER — METHOCARBAMOL 500 MG PO TABS: 500.0000 mg | ORAL_TABLET | Freq: Four times a day (QID) | ORAL | 0 refills | Status: DC | PRN

## 2016-03-24 MED ORDER — APIXABAN 2.5 MG PO TABS: 2.5000 mg | ORAL_TABLET | Freq: Two times a day (BID) | ORAL | 0 refills | Status: DC

## 2016-03-24 NOTE — Progress Notes (Signed)
   Subjective: 2 Days Post-Op Procedure(s) (LRB): LEFT TOTAL HIP ARTHROPLASTY ANTERIOR APPROACH (Left) Patient reports pain as mild.   Patient seen in rounds with Dr. Wynelle Link.  Doing better today. Patient is well, but has had some minor complaints of pain in the hip, requiring pain medications Patient is ready to go home later today.  Objective: Vital signs in last 24 hours: Temp:  [98.4 F (36.9 C)-100.5 F (38.1 C)] 100.5 F (38.1 C) (01/05 0546) Pulse Rate:  [79-88] 88 (01/05 0546) Resp:  [16-17] 17 (01/05 0546) BP: (124-139)/(54-70) 139/69 (01/05 0546) SpO2:  [95 %-100 %] 97 % (01/05 0546)  Intake/Output from previous day:  Intake/Output Summary (Last 24 hours) at 03/24/16 0847 Last data filed at 03/23/16 2158  Gross per 24 hour  Intake              720 ml  Output             3500 ml  Net            -2780 ml    Intake/Output this shift: No intake/output data recorded.  Labs:  Recent Labs  03/23/16 0431 03/24/16 0443  HGB 9.3* 9.4*    Recent Labs  03/23/16 0431 03/24/16 0443  WBC 8.1 10.8*  RBC 3.20* 3.19*  HCT 28.0* 28.3*  PLT 184 202    Recent Labs  03/23/16 1425 03/24/16 0443  NA 133* 139  K 4.0 4.2  CL 99* 100*  CO2 24 29  BUN 25* 26*  CREATININE 1.70* 1.50*  GLUCOSE 154* 163*  CALCIUM 9.1 9.3   No results for input(s): LABPT, INR in the last 72 hours.  EXAM: General - Patient is Alert, Appropriate and Oriented Extremity - Neurovascular intact Sensation intact distally Intact pulses distally Incision - clean, dry, no drainage Motor Function - intact, moving foot and toes well on exam.   Assessment/Plan: 2 Days Post-Op Procedure(s) (LRB): LEFT TOTAL HIP ARTHROPLASTY ANTERIOR APPROACH (Left) Procedure(s) (LRB): LEFT TOTAL HIP ARTHROPLASTY ANTERIOR APPROACH (Left) Past Medical History:  Diagnosis Date  . Aortic stenosis    Dr. Domenic Polite follows- LOV notes 02-02-16 Epic.  . Arthritis   . Chronic kidney disease    Dr. Tami Ribas-  noted increase kidney function levels- took pt off Metformin and started on Onglyza for Blood sugar control-pt being followed for this.  Marland Kitchen GERD (gastroesophageal reflux disease)   . Gout   . Hypertension   . Hypothyroidism   . Type 2 diabetes mellitus ([place])   . Wears glasses   . Wears partial dentures    Upper   Principal Problem:   OA (osteoarthritis) of hip  Estimated body mass index is 35 kg/m as calculated from the following:   Height as of this encounter: 5\' 9"  (1.753 m).   Weight as of this encounter: 107.5 kg (237 lb). Up with therapy Discharge home with home health Diet - Cardiac diet, Diabetic diet and Renal diet Follow up - in 2 weeks Activity - WBAT Disposition - Home Condition Upon Discharge - Good D/C Meds - See DC Summary DVT Prophylaxis - Eliquis  Arlee Muslim, PA-C Orthopaedic Surgery 03/24/2016, 8:47 AM

## 2016-03-24 NOTE — Progress Notes (Signed)
Physical Therapy Treatment Patient Details Name: CATI UHLE MRN: [id number] DOB: Nov 24, 1950 Today's Date: 03/24/2016    History of Present Illness s/p L DA THA    PT Comments    Pt progressing well and eager for dc home.  Husband present and reviewed stairs and car transfers  Follow Up Recommendations  Home health PT     Equipment Recommendations  None recommended by PT    Recommendations for Other Services OT consult     Precautions / Restrictions Precautions Precautions: Fall Restrictions Weight Bearing Restrictions: No Other Position/Activity Restrictions: WBAT    Mobility  Bed Mobility Overal bed mobility: Needs Assistance Bed Mobility: Supine to Sit     Supine to sit: Min guard Sit to supine: Supervision   General bed mobility comments: Pt assisting L LE with R LE  Transfers Overall transfer level: Needs assistance Equipment used: Rolling walker (2 wheeled) Transfers: Sit to/from Stand Sit to Stand: Supervision         General transfer comment: min cues for LE management and use of UEs to self assist  Ambulation/Gait Ambulation/Gait assistance: Min guard;Supervision Ambulation Distance (Feet): 222 Feet Assistive device: Standard walker Gait Pattern/deviations: Step-to pattern;Decreased step length - right;Decreased step length - left;Shuffle;Trunk flexed Gait velocity: decr Gait velocity interpretation: Below normal speed for age/gender General Gait Details: cues for sequence, posture and position from RW   Stairs Stairs: Yes   Stair Management: One rail Left;Step to pattern;Forwards;With cane Number of Stairs: 4 General stair comments: cues for sequence and foot/QC placement  Wheelchair Mobility    Modified Rankin (Stroke Patients Only)       Balance                                    Cognition Arousal/Alertness: Awake/alert Behavior During Therapy: WFL for tasks assessed/performed Overall Cognitive Status: Within  Functional Limits for tasks assessed                      Exercises Total Joint Exercises Ankle Circles/Pumps: AROM;Both;Supine;20 reps Quad Sets: AROM;Both;10 reps;Supine Heel Slides: AAROM;Left;20 reps;Supine Hip ABduction/ADduction: AAROM;Left;15 reps;Supine    General Comments        Pertinent Vitals/Pain Pain Assessment: 0-10 Pain Score: 2  Pain Location: L hip Pain Descriptors / Indicators: Sore Pain Intervention(s): Limited activity within patient's tolerance;Monitored during session;Premedicated before session    Home Living                      Prior Function            PT Goals (current goals can now be found in the care plan section) Acute Rehab PT Goals Patient Stated Goal: Regain IND and walk without pain PT Goal Formulation: With patient Time For Goal Achievement: 03/25/16 Potential to Achieve Goals: Good Progress towards PT goals: Progressing toward goals    Frequency    7X/week      PT Plan Current plan remains appropriate    Co-evaluation             End of Session Equipment Utilized During Treatment: Gait belt Activity Tolerance: Patient tolerated treatment well Patient left: Other (comment) (EOB)     Time: [id number] PT Time Calculation (min) (ACUTE ONLY): 33 min  Charges:  $Gait Training: 8-22 mins $Therapeutic Exercise: 8-22 mins $Therapeutic Activity: 8-22 mins  G Codes:      Dossie Swor April 18, 2016, 12:25 PM

## 2016-03-24 NOTE — Progress Notes (Signed)
Physical Therapy Treatment Patient Details Name: Sabrina Deleon MRN: [id number] DOB: [birth year] Today's Date: 03/29/16    History of Present Illness s/p L DA THA    PT Comments    Pt progressing well with mobility and eager for dc home.  Deferred stairs to husbands arrival.  Follow Up Recommendations  Home health PT     Equipment Recommendations  None recommended by PT    Recommendations for Other Services OT consult     Precautions / Restrictions Precautions Precautions: Fall Restrictions Weight Bearing Restrictions: No Other Position/Activity Restrictions: WBAT    Mobility  Bed Mobility Overal bed mobility: Needs Assistance Bed Mobility: Supine to Sit     Supine to sit: Min guard Sit to supine: Supervision   General bed mobility comments: L LE with R LE  Transfers Overall transfer level: Needs assistance Equipment used: Rolling walker (2 wheeled) Transfers: Sit to/from Stand Sit to Stand: Supervision         General transfer comment: cues for LE management and use of UEs to self assist  Ambulation/Gait Ambulation/Gait assistance: Min guard Ambulation Distance (Feet): 160 Feet Assistive device: Standard walker Gait Pattern/deviations: Step-to pattern;Decreased step length - right;Decreased step length - left;Shuffle;Trunk flexed Gait velocity: decr Gait velocity interpretation: Below normal speed for age/gender General Gait Details: cues for sequence, posture and position from [employer]            Wheelchair Mobility    Modified Rankin (Stroke Patients Only)       Balance                                    Cognition Arousal/Alertness: Awake/alert Behavior During Therapy: WFL for tasks assessed/performed Overall Cognitive Status: Within Functional Limits for tasks assessed                      Exercises Total Joint Exercises Ankle Circles/Pumps: AROM;Both;Supine;20 reps Quad Sets: AROM;Both;10  reps;Supine Heel Slides: AAROM;Left;20 reps;Supine Hip ABduction/ADduction: AAROM;Left;15 reps;Supine    General Comments        Pertinent Vitals/Pain Pain Assessment: 0-10 Pain Score: 2  Pain Location: L hip Pain Descriptors / Indicators: Sore Pain Intervention(s): Limited activity within patient's tolerance;Monitored during session;Premedicated before session;Ice applied    Home Living                      Prior Function            PT Goals (current goals can now be found in the care plan section) Acute Rehab PT Goals Patient Stated Goal: Regain IND and walk without pain PT Goal Formulation: With patient Time For Goal Achievement: 03/25/16 Potential to Achieve Goals: Good Progress towards PT goals: Progressing toward goals    Frequency    7X/week      PT Plan Current plan remains appropriate    Co-evaluation             End of Session Equipment Utilized During Treatment: Gait belt Activity Tolerance: Patient tolerated treatment well Patient left: in chair;with call bell/phone within reach     Time: 0825-0855 PT Time Calculation (min) (ACUTE ONLY): 30 min  Charges:  $Gait Training: 8-22 mins $Therapeutic Exercise: 8-22 mins                    G Codes:      Davie Sagona 03-29-16, 12:20  PM   

## 2016-03-24 NOTE — Discharge Summary (Signed)
Physician Discharge Summary   Patient ID: Sabrina Deleon MRN: [id number] DOB/AGE: June 25, 1950 66 y.o.  Admit date: 03/22/2016 Discharge date: 03-24-2016  Primary Diagnosis:  Osteoarthritis of the Left hip.   Admission Diagnoses:  Past Medical History:  Diagnosis Date  . Aortic stenosis    Dr. Domenic Polite follows- LOV notes 02-02-16 Epic.  . Arthritis   . Chronic kidney disease    Dr. Tami Ribas- noted increase kidney function levels- took pt off Metformin and started on Onglyza for Blood sugar control-pt being followed for this.  Marland Kitchen GERD (gastroesophageal reflux disease)   . Gout   . Hypertension   . Hypothyroidism   . Type 2 diabetes mellitus ([place])   . Wears glasses   . Wears partial dentures    Upper   Discharge Diagnoses:   Principal Problem:   OA (osteoarthritis) of hip  Estimated body mass index is 35 kg/m as calculated from the following:   Height as of this encounter: 5' 9"  (1.753 m).   Weight as of this encounter: 107.5 kg (237 lb).  Procedure(s) (LRB): LEFT TOTAL HIP ARTHROPLASTY ANTERIOR APPROACH (Left)   Consults: None  HPI: Sabrina Deleon is a 66 y.o. female who has advanced end-  stage arthritis of their Left  hip with progressively worsening pain and  dysfunction.The patient has failed nonoperative management and presents for  total hip arthroplasty.   Laboratory Data: Admission on 03/22/2016  Component Date Value Ref Range Status  . Glucose-Capillary 03/22/2016 143* 65 - 99 mg/dL Final  . Comment 1 03/22/2016 Notify RN   Final  . Glucose-Capillary 03/22/2016 182* 65 - 99 mg/dL Final  . WBC 03/23/2016 8.1  4.0 - 10.5 K/uL Final  . RBC 03/23/2016 3.20* 3.87 - 5.11 MIL/uL Final  . Hemoglobin 03/23/2016 9.3* 12.0 - 15.0 g/dL Final  . HCT 03/23/2016 28.0* 36.0 - 46.0 % Final  . MCV 03/23/2016 87.5  78.0 - 100.0 fL Final  . MCH 03/23/2016 29.1  26.0 - 34.0 pg Final  . MCHC 03/23/2016 33.2  30.0 - 36.0 g/dL Final  . RDW 03/23/2016 14.6  11.5 - 15.5 % Final  .  Platelets 03/23/2016 184  150 - 400 K/uL Final  . Sodium 03/23/2016 136  135 - 145 mmol/L Final  . Potassium 03/23/2016 5.2* 3.5 - 5.1 mmol/L Final  . Chloride 03/23/2016 101  101 - 111 mmol/L Final  . CO2 03/23/2016 27  22 - 32 mmol/L Final  . Glucose, Bld 03/23/2016 245* 65 - 99 mg/dL Final  . BUN 03/23/2016 24* 6 - 20 mg/dL Final  . Creatinine, Ser 03/23/2016 1.41* 0.44 - 1.00 mg/dL Final  . Calcium 03/23/2016 9.1  8.9 - 10.3 mg/dL Final  . GFR calc non Af Amer 03/23/2016 38* >60 mL/min Final  . GFR calc Af Amer 03/23/2016 45* >60 mL/min Final   Comment: (NOTE) The eGFR has been calculated using the CKD EPI equation. This calculation has not been validated in all clinical situations. eGFR's persistently <60 mL/min signify possible Chronic Kidney Disease.   . Anion gap 03/23/2016 8  5 - 15 Final  . Glucose-Capillary 03/22/2016 320* 65 - 99 mg/dL Final  . Glucose-Capillary 03/23/2016 193* 65 - 99 mg/dL Final  . Sodium 03/23/2016 133* 135 - 145 mmol/L Final  . Potassium 03/23/2016 4.0  3.5 - 5.1 mmol/L Final  . Chloride 03/23/2016 99* 101 - 111 mmol/L Final  . CO2 03/23/2016 24  22 - 32 mmol/L Final  . Glucose, Bld 03/23/2016 154*  65 - 99 mg/dL Final  . BUN 03/23/2016 25* 6 - 20 mg/dL Final  . Creatinine, Ser 03/23/2016 1.70* 0.44 - 1.00 mg/dL Final  . Calcium 03/23/2016 9.1  8.9 - 10.3 mg/dL Final  . GFR calc non Af Amer 03/23/2016 31* >60 mL/min Final  . GFR calc Af Amer 03/23/2016 36* >60 mL/min Final   Comment: (NOTE) The eGFR has been calculated using the CKD EPI equation. This calculation has not been validated in all clinical situations. eGFR's persistently <60 mL/min signify possible Chronic Kidney Disease.   . Anion gap 03/23/2016 10  5 - 15 Final  . Glucose-Capillary 03/23/2016 150* 65 - 99 mg/dL Final  . WBC 03/24/2016 10.8* 4.0 - 10.5 K/uL Final  . RBC 03/24/2016 3.19* 3.87 - 5.11 MIL/uL Final  . Hemoglobin 03/24/2016 9.4* 12.0 - 15.0 g/dL Final  . HCT 03/24/2016  28.3* 36.0 - 46.0 % Final  . MCV 03/24/2016 88.7  78.0 - 100.0 fL Final  . MCH 03/24/2016 29.5  26.0 - 34.0 pg Final  . MCHC 03/24/2016 33.2  30.0 - 36.0 g/dL Final  . RDW 03/24/2016 14.7  11.5 - 15.5 % Final  . Platelets 03/24/2016 202  150 - 400 K/uL Final  . Sodium 03/24/2016 139  135 - 145 mmol/L Final  . Potassium 03/24/2016 4.2  3.5 - 5.1 mmol/L Final  . Chloride 03/24/2016 100* 101 - 111 mmol/L Final  . CO2 03/24/2016 29  22 - 32 mmol/L Final  . Glucose, Bld 03/24/2016 163* 65 - 99 mg/dL Final  . BUN 03/24/2016 26* 6 - 20 mg/dL Final  . Creatinine, Ser 03/24/2016 1.50* 0.44 - 1.00 mg/dL Final  . Calcium 03/24/2016 9.3  8.9 - 10.3 mg/dL Final  . GFR calc non Af Amer 03/24/2016 36* >60 mL/min Final  . GFR calc Af Amer 03/24/2016 41* >60 mL/min Final   Comment: (NOTE) The eGFR has been calculated using the CKD EPI equation. This calculation has not been validated in all clinical situations. eGFR's persistently <60 mL/min signify possible Chronic Kidney Disease.   . Anion gap 03/24/2016 10  5 - 15 Final  . Glucose-Capillary 03/23/2016 139* 65 - 99 mg/dL Final  . Glucose-Capillary 03/23/2016 172* 65 - 99 mg/dL Final  . Glucose-Capillary 03/24/2016 158* 65 - 99 mg/dL Final  Hospital Outpatient Visit on 03/15/2016  Component Date Value Ref Range Status  . Glucose-Capillary 03/15/2016 150* 65 - 99 mg/dL Final  . MRSA, PCR 03/15/2016 NEGATIVE  NEGATIVE Final  . Staphylococcus aureus 03/15/2016 NEGATIVE  NEGATIVE Final   Comment:        The Xpert SA Assay (FDA approved for NASAL specimens in patients over 33 years of age), is one component of a comprehensive surveillance program.  Test performance has been validated by [facility] for patients greater than or equal to 98 year old. It is not intended to diagnose infection nor to guide or monitor treatment.   Marland Kitchen aPTT 03/15/2016 27  24 - 36 seconds Final  . WBC 03/15/2016 6.7  4.0 - 10.5 K/uL Final  . RBC 03/15/2016 3.96  3.87  - 5.11 MIL/uL Final  . Hemoglobin 03/15/2016 11.4* 12.0 - 15.0 g/dL Final  . HCT 03/15/2016 34.6* 36.0 - 46.0 % Final  . MCV 03/15/2016 87.4  78.0 - 100.0 fL Final  . MCH 03/15/2016 28.8  26.0 - 34.0 pg Final  . MCHC 03/15/2016 32.9  30.0 - 36.0 g/dL Final  . RDW 03/15/2016 14.9  11.5 - 15.5 %  Final  . Platelets 03/15/2016 233  150 - 400 K/uL Final  . Sodium 03/15/2016 139  135 - 145 mmol/L Final  . Potassium 03/15/2016 4.9  3.5 - 5.1 mmol/L Final  . Chloride 03/15/2016 102  101 - 111 mmol/L Final  . CO2 03/15/2016 29  22 - 32 mmol/L Final  . Glucose, Bld 03/15/2016 139* 65 - 99 mg/dL Final  . BUN 03/15/2016 32* 6 - 20 mg/dL Final  . Creatinine, Ser 03/15/2016 1.57* 0.44 - 1.00 mg/dL Final  . Calcium 03/15/2016 9.9  8.9 - 10.3 mg/dL Final  . Total Protein 03/15/2016 7.7  6.5 - 8.1 g/dL Final  . Albumin 03/15/2016 4.4  3.5 - 5.0 g/dL Final  . AST 03/15/2016 38  15 - 41 U/L Final  . ALT 03/15/2016 19  14 - 54 U/L Final  . Alkaline Phosphatase 03/15/2016 56  38 - 126 U/L Final  . Total Bilirubin 03/15/2016 0.5  0.3 - 1.2 mg/dL Final  . GFR calc non Af Amer 03/15/2016 34* >60 mL/min Final  . GFR calc Af Amer 03/15/2016 39* >60 mL/min Final   Comment: (NOTE) The eGFR has been calculated using the CKD EPI equation. This calculation has not been validated in all clinical situations. eGFR's persistently <60 mL/min signify possible Chronic Kidney Disease.   . Anion gap 03/15/2016 8  5 - 15 Final  . Prothrombin Time 03/15/2016 13.7  11.4 - 15.2 seconds Final  . INR 03/15/2016 1.04   Final  . ABO/RH(D) 03/22/2016 O POS   Final  . Antibody Screen 03/22/2016 NEG   Final  . Sample Expiration 03/22/2016 03/25/2016   Final  . Extend sample reason 03/22/2016 NO TRANSFUSIONS OR PREGNANCY IN THE PAST 3 MONTHS   Final  . Color, Urine 03/15/2016 YELLOW  YELLOW Final  . APPearance 03/15/2016 CLEAR  CLEAR Final  . Specific Gravity, Urine 03/15/2016 1.016  1.005 - 1.030 Final  . pH 03/15/2016 6.0   5.0 - 8.0 Final  . Glucose, UA 03/15/2016 NEGATIVE  NEGATIVE mg/dL Final  . Hgb urine dipstick 03/15/2016 NEGATIVE  NEGATIVE Final  . Bilirubin Urine 03/15/2016 NEGATIVE  NEGATIVE Final  . Ketones, ur 03/15/2016 NEGATIVE  NEGATIVE mg/dL Final  . Protein, ur 03/15/2016 NEGATIVE  NEGATIVE mg/dL Final  . Nitrite 03/15/2016 NEGATIVE  NEGATIVE Final  . Leukocytes, UA 03/15/2016 TRACE* NEGATIVE Final  . RBC / HPF 03/15/2016 0-5  0 - 5 RBC/hpf Final  . WBC, UA 03/15/2016 0-5  0 - 5 WBC/hpf Final  . Bacteria, UA 03/15/2016 NONE SEEN  NONE SEEN Final  . Squamous Epithelial / LPF 03/15/2016 0-5* NONE SEEN Final  . Mucous 03/15/2016 PRESENT   Final  . Hyaline Casts, UA 03/15/2016 PRESENT   Final  . ABO/RH(D) 03/15/2016 O POS   Final  Appointment on 02/17/2016  Component Date Value Ref Range Status  . AV vel 02/17/2016 1.11   Final  . LV PW d 02/17/2016 13.9* 0.6 - 1.1 mm Final  . FS 02/17/2016 32  28 - 44 % Final  . LA vol 02/17/2016 57.7  mL Final  . LA ID, A-P, ES 02/17/2016 36  mm Final  . IVS/LV PW RATIO, ED 02/17/2016 .79   Final  . Stroke v 02/17/2016 45  ml Final  . LVOT VTI 02/17/2016 29.8  cm Final  . Reg peak vel 02/17/2016 156  cm/s Final  . RV sys press 02/17/2016 13  mmHg Final  . LV e' LATERAL 02/17/2016 8.07  cm/s Final  . LV E/e' medial 02/17/2016 14.87   Final  . LV E/e'average 02/17/2016 14.87   Final  . AV pk vel 02/17/2016 331  cm/s Final  . AV Area VTI index 02/17/2016 .49  cm2/m2 Final  . AV Area VTI 02/17/2016 1.11  cm2 Final  . AV VEL mean LVOT/AV 02/17/2016 .33   Final  . AV Area mean vel 02/17/2016 1.03  cm2 Final  . AV area mean vel ind 02/17/2016 .46  cm2/m2 Final  . LA diam index 02/17/2016 1.59  cm/m2 Final  . LA vol A4C 02/17/2016 50  ml Final  . Mean grad 02/17/2016 27  mmHg Final  . Valve area 02/17/2016 1.11  cm2 Final  . LVOT peak grad rest 02/17/2016 5  mmHg Final  . E decel time 02/17/2016 225  msec Final  . LVOT diameter 02/17/2016 20  mm Final  .  LVOT area 02/17/2016 3.14  cm2 Final  . LVOT peak vel 02/17/2016 117  cm/s Final  . LVOT peak VTI 02/17/2016 .35  cm Final  . Ao pk vel 02/17/2016 .35  m/s Final  . VTI 02/17/2016 84.2  cm Final  . LVOT SV 02/17/2016 94.00  mL Final  . Peak grad 02/17/2016 44  mmHg Final  . Peak grad 02/17/2016 6  mmHg Final  . E/e' ratio 02/17/2016 14.87   Final  . AO mean calculated velocity dopler 02/17/2016 241  cm/s Final  . MV pk E vel 02/17/2016 120  m/s Final  . TR max vel 02/17/2016 156  cm/s Final  . MV pk A vel 02/17/2016 159  m/s Final  . LV sys vol 02/17/2016 19  14 - 42 mL Final  . LV sys vol index 02/17/2016 8.0  mL/m2 Final  . LV dias vol 02/17/2016 64  46 - 106 mL Final  . LV dias vol index 02/17/2016 28.0  mL/m2 Final  . LA vol index 02/17/2016 25.5  mL/m2 Final  . Valve area index 02/17/2016 .49   Final  . AV peak Index 02/17/2016 .49   Final  . MV Dec 02/17/2016 225   Final  . LA diam end sys 02/17/2016 36.00  mm Final  . Simpson's disk 02/17/2016 71.00   Final  . TDI e' medial 02/17/2016 6.27   Final  . TDI e' lateral 02/17/2016 8.07   Final  . TAPSE 02/17/2016 24.90  mm Final     X-Rays:Dg Pelvis Portable  Result Date: 03/22/2016 CLINICAL DATA:  Left anterior total hip replacement. EXAM: PORTABLE PELVIS 1-2 VIEWS COMPARISON:  None. FINDINGS: A left total hip arthroplasty is noted. A single AP view is submitted. There is no radiographic evidence for complication. A drain is in place. IMPRESSION: Left total hip arthroplasty without radiographic evidence for complication. Electronically Signed   By: San Morelle M.D.   On: 03/22/2016 16:09   Dg C-arm 1-60 Min-no Report  Result Date: 03/22/2016 There is no Radiologist interpretation  for this exam.   EKG: Orders placed or performed in visit on 02/02/16  . EKG 12-Lead     Hospital Course: Patient was admitted to [facility] and taken to the OR and underwent the above state procedure without complications.   Patient tolerated the procedure well and was later transferred to the recovery room and then to the orthopaedic floor for postoperative care.  They were given PO and IV analgesics for pain control following their surgery.  They were given 24 hours of  postoperative antibiotics of  Anti-infectives    Start     Dose/Rate Route Frequency Ordered Stop   03/22/16 2000  ceFAZolin (ANCEF) IVPB 2g/100 mL premix     2 g 200 mL/hr over 30 Minutes Intravenous Every 6 hours 03/22/16 1823 03/23/16 0234   03/22/16 1006  ceFAZolin (ANCEF) IVPB 2g/100 mL premix     2 g 200 mL/hr over 30 Minutes Intravenous On call to O.R. 03/22/16 1006 03/22/16 1345     and started on DVT prophylaxis in the form of Eliquis.   PT and OT were ordered for total hip protocol.  The patient was allowed to be WBAT with therapy. Discharge planning was consulted to help with postop disposition and equipment needs.  Patient had a tough night on the evening of surgery but doing better the next morning.  They started to get up OOB with therapy on day one.  Hemovac drain was pulled without difficulty.  Continued to work with therapy into day two.  Dressing was changed on day two and the incision was healing well.  Patient was seen in rounds on POD 2 and was ready to go home.  Discharge home with home health Diet - Cardiac diet, Diabetic diet and Renal diet Follow up - in 2 weeks Activity - WBAT Disposition - Home Condition Upon Discharge - Good D/C Meds - See DC Summary DVT Prophylaxis - Eliquis  Discharge Instructions    Call MD / Call 911    Complete by:  As directed    If you experience chest pain or shortness of breath, CALL 911 and be transported to the hospital emergency room.  If you develope a fever above 101 F, pus (white drainage) or increased drainage or redness at the wound, or calf pain, call your surgeon's office.   Change dressing    Complete by:  As directed    You may change your dressing dressing daily with sterile 4  x 4 inch gauze dressing and paper tape.  Do not submerge the incision under water.   Constipation Prevention    Complete by:  As directed    Drink plenty of fluids.  Prune juice may be helpful.  You may use a stool softener, such as Colace (over the counter) 100 mg twice a day.  Use MiraLax (over the counter) for constipation as needed.   Diet - low sodium heart healthy    Complete by:  As directed    Diet Carb Modified    Complete by:  As directed    Discharge instructions    Complete by:  As directed    Pick up stool softner and laxative for home use following surgery while on pain medications. Do not submerge incision under water. Please use good hand washing techniques while changing dressing each day. May shower starting three days after surgery. Please use a clean towel to pat the incision dry following showers. Continue to use ice for pain and swelling after surgery. Do not use any lotions or creams on the incision until instructed by your surgeon.  Wear both TED hose on both legs during the day every day for three weeks, but may have off at night at home.  Postoperative Constipation Protocol  Constipation - defined medically as fewer than three stools per week and severe constipation as less than one stool per week.  One of the most common issues patients have following surgery is constipation.  Even if you have a regular bowel pattern at home,  your normal regimen is likely to be disrupted due to multiple reasons following surgery.  Combination of anesthesia, postoperative narcotics, change in appetite and fluid intake all can affect your bowels.  In order to avoid complications following surgery, here are some recommendations in order to help you during your recovery period.  Colace (docusate) - Pick up an over-the-counter form of Colace or another stool softener and take twice a day as long as you are requiring postoperative pain medications.  Take with a full glass of water  daily.  If you experience loose stools or diarrhea, hold the colace until you stool forms back up.  If your symptoms do not get better within 1 week or if they get worse, check with your doctor.  Dulcolax (bisacodyl) - Pick up over-the-counter and take as directed by the product packaging as needed to assist with the movement of your bowels.  Take with a full glass of water.  Use this product as needed if not relieved by Colace only.   MiraLax (polyethylene glycol) - Pick up over-the-counter to have on hand.  MiraLax is a solution that will increase the amount of water in your bowels to assist with bowel movements.  Take as directed and can mix with a glass of water, juice, soda, coffee, or tea.  Take if you go more than two days without a movement. Do not use MiraLax more than once per day. Call your doctor if you are still constipated or irregular after using this medication for 7 days in a row.  If you continue to have problems with postoperative constipation, please contact the office for further assistance and recommendations.  If you experience "the worst abdominal pain ever" or develop nausea or vomiting, please contact the office immediatly for further recommendations for treatment.   Take Eliquis twice a day for two and a half more weeks, then discontinue Eliquis. Once the patient has completed the Eliquis, they may resume the 81 mg Aspirin.    Do not sit on low chairs, stoools or toilet seats, as it may be difficult to get up from low surfaces    Complete by:  As directed    Driving restrictions    Complete by:  As directed    No driving until released by the physician.   Increase activity slowly as tolerated    Complete by:  As directed    Lifting restrictions    Complete by:  As directed    No lifting until released by the physician.   Patient may shower    Complete by:  As directed    You may shower without a dressing once there is no drainage.  Do not wash over the wound.  If  drainage remains, do not shower until drainage stops.   TED hose    Complete by:  As directed    Use stockings (TED hose) for 3 weeks on both leg(s).  You may remove them at night for sleeping.   Weight bearing as tolerated    Complete by:  As directed    Laterality:  left   Extremity:  Lower     Allergies as of 03/24/2016   No Known Allergies     Medication List    STOP taking these medications   amoxicillin-clavulanate 875-125 MG tablet Commonly known as:  AUGMENTIN   aspirin 81 MG tablet   cholecalciferol 1000 units tablet Commonly known as:  VITAMIN D   HYDROcodone-acetaminophen 7.5-325 MG tablet Commonly known as:  NORCO     TAKE these medications   allopurinol 100 MG tablet Commonly known as:  ZYLOPRIM take 1 tablet by mouth twice a day   apixaban 2.5 MG Tabs tablet Commonly known as:  ELIQUIS Take 1 tablet (2.5 mg total) by mouth every 12 (twelve) hours. Take Eliquid twice a day for two and a half more weeks, then discontinue the Eliquis Once the patient has completed the Eliquis, they may resume the 81 mg Aspirin.   citalopram 20 MG tablet Commonly known as:  CELEXA take 1 1/2 tablet by mouth once daily   COLCRYS 0.6 MG tablet Generic drug:  colchicine take 1 tablet by mouth once daily   ferrous sulfate 325 (65 FE) MG tablet Take 325 mg by mouth daily with breakfast.   furosemide 40 MG tablet Commonly known as:  LASIX Take 1 tablet (40 mg total) by mouth daily.   levothyroxine 125 MCG tablet Commonly known as:  SYNTHROID, LEVOTHROID take 1 tablet by mouth once daily   lisinopril 20 MG tablet Commonly known as:  PRINIVIL,ZESTRIL take 1 tablet by mouth once daily   methocarbamol 500 MG tablet Commonly known as:  ROBAXIN Take 1 tablet (500 mg total) by mouth every 6 (six) hours as needed for muscle spasms.   omeprazole 20 MG capsule Commonly known as:  PRILOSEC Take 20 mg by mouth daily.   oxyCODONE 5 MG immediate release tablet Commonly known  as:  Oxy IR/ROXICODONE Take 1-2 tablets (5-10 mg total) by mouth every 4 (four) hours as needed for moderate pain or severe pain.   pravastatin 40 MG tablet Commonly known as:  PRAVACHOL take 1 tablet by mouth once daily What changed:  See the new instructions.   saxagliptin HCl 2.5 MG Tabs tablet Commonly known as:  ONGLYZA Take 1 tablet (2.5 mg total) by mouth daily.   zolpidem 10 MG tablet Commonly known as:  AMBIEN take 1 tablet by mouth at bedtime      Follow-up Information    [facility] Follow up.   Specialty:  Home Health Services Why:  home health physical therapy Contact information: [place] [address] [phone]        [name], MD. Schedule an appointment as soon as possible for a visit on 04/04/2016.   Specialty:  Orthopedic Surgery Contact information: [address] [place] [address] [phone]           Signed: Arlee Muslim, PA-C Orthopaedic Surgery 03/24/2016, 8:55 AM

## 2016-03-24 NOTE — Progress Notes (Signed)
Occupational Therapy Treatment Patient Details Name: Sabrina Deleon MRN: [id number] DOB: 04-30-1950 Today's Date: 03/24/2016    History of present illness s/p L DA THA   OT comments  All education completed this session  Follow Up Recommendations  Supervision/Assistance - 24 hour    Equipment Recommendations  None recommended by OT    Recommendations for Other Services      Precautions / Restrictions Precautions Precautions: Fall Restrictions Weight Bearing Restrictions: No Other Position/Activity Restrictions: WBAT       Mobility Bed Mobility           Sit to supine: Supervision   General bed mobility comments: used RLE to assist L back to bed  Transfers       Sit to Stand: Supervision              Balance                                   ADL Overall ADL's : Needs assistance/impaired                                 Tub/ Shower Transfer: Min guard;Walk-in shower;Ambulation     General ADL Comments: practiced shower transfer.  Pt did not need to use commode, but based on clinical judgment this is supervision level (like back to bed).  Pt has a reacher, but she feels like she will just have her husband assist her.  Practiced back to bed at supervision level.  Recommended she use a single sturdy stepstool as her bed is high      Tourist information centre manager   Behavior During Therapy: WFL for tasks assessed/performed Overall Cognitive Status: Within Functional Limits for tasks assessed                       Extremity/Trunk Assessment               Exercises     Shoulder Instructions       General Comments      Pertinent Vitals/ Pain       Pain Score: 2  Pain Location: L hip Pain Descriptors / Indicators: Sore Pain Intervention(s): Limited activity within patient's tolerance;Monitored during session;Premedicated before session;Repositioned  Home  Living                                          Prior Functioning/Environment              Frequency           Progress Toward Goals  OT Goals(current goals can now be found in the care plan section)  Progress towards OT goals: Goals met/education completed, patient discharged from [place]                 End of Session     Activity Tolerance Patient tolerated treatment well   Patient Left with call bell/phone within reach;in bed   Nurse Communication  Time: 4270-6237 OT Time Calculation (min): 20 min  Charges: OT General Charges $OT Visit: 1 Procedure OT Treatments $Self Care/Home Management : 8-22 mins  Grayson White 03/24/2016, 9:49 AM Lesle Chris, OTR/L [phone] 03/24/2016

## 2016-03-31 ENCOUNTER — Telehealth: Payer: Self-pay | Admitting: Family Medicine

## 2016-03-31 NOTE — Telephone Encounter (Signed)
Patient diabetic meds were changed at last visit and sugars have been running high since the change . She had hip replacement 03/22/16.Sugar running 175-200

## 2016-03-31 NOTE — Telephone Encounter (Signed)
This patient may have to be placed on insulin. Currently right now the numbers are not high enough to be highly worrisome but certainly it could end up needing to be on insulin. I recommend the patient check her sugars twice daily both in the morning and before supper for the next several days then call that in to [place] next week and we can review those numbers then make a decision. Very important to eat healthy

## 2016-03-31 NOTE — Telephone Encounter (Signed)
Patient's daughter-in-law was advised.

## 2016-03-31 NOTE — Telephone Encounter (Signed)
Please advise recommendations

## 2016-04-11 ENCOUNTER — Telehealth: Payer: Self-pay | Admitting: Family Medicine

## 2016-04-11 MED ORDER — KETOCONAZOLE 2 % EX CREA: 1.0000 "application " | TOPICAL_CREAM | Freq: Two times a day (BID) | CUTANEOUS | 2 refills | Status: DC

## 2016-04-11 NOTE — Telephone Encounter (Signed)
Requesting Rx for ketoconazole cream to [facility].  The tape and bandaging around the area of her hip are irritating her skin.

## 2016-04-11 NOTE — Telephone Encounter (Signed)
Patient was advised and voiced understanding 

## 2016-04-11 NOTE — Telephone Encounter (Signed)
Ketoconazole cream apply twice a day, 30 g tube, 2 refills-consider switching to paper tape to lessen irritation

## 2016-04-13 ENCOUNTER — Other Ambulatory Visit (INDEPENDENT_AMBULATORY_CARE_PROVIDER_SITE_OTHER): Payer: Self-pay | Admitting: *Deleted

## 2016-04-13 ENCOUNTER — Encounter (INDEPENDENT_AMBULATORY_CARE_PROVIDER_SITE_OTHER): Payer: Self-pay | Admitting: *Deleted

## 2016-04-13 DIAGNOSIS — K625 Hemorrhage of anus and rectum: Secondary | ICD-10-CM

## 2016-04-25 LAB — HEMOGLOBIN AND HEMATOCRIT, BLOOD
HEMATOCRIT: 33.6 % — AB (ref 35.0–45.0)
Hemoglobin: 10.8 g/dL — ABNORMAL LOW (ref 11.7–15.5)

## 2016-05-01 ENCOUNTER — Other Ambulatory Visit (INDEPENDENT_AMBULATORY_CARE_PROVIDER_SITE_OTHER): Payer: Self-pay | Admitting: *Deleted

## 2016-05-01 DIAGNOSIS — K625 Hemorrhage of anus and rectum: Secondary | ICD-10-CM

## 2016-05-15 ENCOUNTER — Encounter (INDEPENDENT_AMBULATORY_CARE_PROVIDER_SITE_OTHER): Payer: Self-pay | Admitting: *Deleted

## 2016-05-15 ENCOUNTER — Other Ambulatory Visit (INDEPENDENT_AMBULATORY_CARE_PROVIDER_SITE_OTHER): Payer: Self-pay | Admitting: *Deleted

## 2016-05-15 DIAGNOSIS — K625 Hemorrhage of anus and rectum: Secondary | ICD-10-CM

## 2016-06-02 ENCOUNTER — Telehealth: Payer: Self-pay | Admitting: Family Medicine

## 2016-06-02 ENCOUNTER — Other Ambulatory Visit: Payer: Self-pay | Admitting: Nurse Practitioner

## 2016-06-02 MED ORDER — CEFDINIR 300 MG PO CAPS: 300.0000 mg | ORAL_CAPSULE | Freq: Two times a day (BID) | ORAL | 0 refills | Status: DC

## 2016-06-02 NOTE — Telephone Encounter (Signed)
Message for Sabrina Deleon- patient has sinus infection and requesting Z-pack called into [facility]

## 2016-06-02 NOTE — Telephone Encounter (Signed)
Spoke with patient's daughter inlaw and informed her per Linzie Collin- Could not send in Zpak due to potential interaction with celexa. Sent in antibiotics as requested. Office visit next week if no better. Patient's daughter in law verbalized understanding.

## 2016-06-02 NOTE — Telephone Encounter (Signed)
Could not send in Niland due to potential interaction with Celexa. Sent in antibiotics as requested. Office visit next week if no better.

## 2016-06-17 ENCOUNTER — Other Ambulatory Visit: Payer: Self-pay | Admitting: Family Medicine

## 2016-06-19 ENCOUNTER — Other Ambulatory Visit: Payer: Self-pay | Admitting: Family Medicine

## 2016-06-29 ENCOUNTER — Other Ambulatory Visit: Payer: Self-pay | Admitting: Family Medicine

## 2016-07-06 ENCOUNTER — Other Ambulatory Visit: Payer: Self-pay | Admitting: Family Medicine

## 2016-07-16 ENCOUNTER — Other Ambulatory Visit: Payer: Self-pay | Admitting: Family Medicine

## 2016-07-17 NOTE — Telephone Encounter (Signed)
May have refill on Ambien with 3 refills. Also tell pharmacy metformin was stopped months ago

## 2016-07-18 ENCOUNTER — Other Ambulatory Visit: Payer: Self-pay

## 2016-07-18 MED ORDER — ZOLPIDEM TARTRATE 10 MG PO TABS: 10.0000 mg | ORAL_TABLET | Freq: Every day | ORAL | 3 refills | Status: DC

## 2016-07-19 ENCOUNTER — Other Ambulatory Visit: Payer: Self-pay | Admitting: *Deleted

## 2016-07-19 ENCOUNTER — Telehealth: Payer: Self-pay | Admitting: *Deleted

## 2016-07-19 ENCOUNTER — Other Ambulatory Visit: Payer: Self-pay | Admitting: Nurse Practitioner

## 2016-07-19 MED ORDER — HYDROCODONE-ACETAMINOPHEN 5-325 MG PO TABS: 1.0000 | ORAL_TABLET | ORAL | 0 refills | Status: DC | PRN

## 2016-07-19 MED ORDER — ZOLPIDEM TARTRATE 10 MG PO TABS: 10.0000 mg | ORAL_TABLET | Freq: Every day | ORAL | 3 refills | Status: DC

## 2016-07-19 NOTE — Telephone Encounter (Signed)
Requesting refill on hydrocodone 5/325. Pt would like today.

## 2016-07-19 NOTE — Telephone Encounter (Signed)
Done. Patient takes very sparingly. Not on any other pain meds.

## 2016-08-11 ENCOUNTER — Encounter: Payer: Self-pay | Admitting: Family Medicine

## 2016-08-11 ENCOUNTER — Telehealth: Payer: Self-pay | Admitting: Nurse Practitioner

## 2016-08-11 LAB — HM DIABETES EYE EXAM

## 2016-08-11 NOTE — Telephone Encounter (Signed)
Requesting blood work orders for 08/18/16 for appointment with Hoyle Sauer.

## 2016-08-13 ENCOUNTER — Other Ambulatory Visit: Payer: Self-pay | Admitting: Family Medicine

## 2016-08-18 ENCOUNTER — Encounter: Payer: Self-pay | Admitting: Family Medicine

## 2016-08-18 ENCOUNTER — Ambulatory Visit: Payer: BLUE CROSS/BLUE SHIELD | Admitting: Nurse Practitioner

## 2016-08-18 DIAGNOSIS — Z1231 Encounter for screening mammogram for malignant neoplasm of breast: Secondary | ICD-10-CM | POA: Diagnosis not present

## 2016-08-21 ENCOUNTER — Other Ambulatory Visit: Payer: Self-pay | Admitting: Nurse Practitioner

## 2016-08-21 DIAGNOSIS — E039 Hypothyroidism, unspecified: Secondary | ICD-10-CM

## 2016-08-21 DIAGNOSIS — E1122 Type 2 diabetes mellitus with diabetic chronic kidney disease: Secondary | ICD-10-CM

## 2016-08-21 DIAGNOSIS — N183 Chronic kidney disease, stage 3 (moderate): Secondary | ICD-10-CM

## 2016-08-21 DIAGNOSIS — N1832 Chronic kidney disease, stage 3b: Secondary | ICD-10-CM

## 2016-08-21 DIAGNOSIS — E785 Hyperlipidemia, unspecified: Secondary | ICD-10-CM

## 2016-08-21 DIAGNOSIS — M1000 Idiopathic gout, unspecified site: Secondary | ICD-10-CM

## 2016-08-21 DIAGNOSIS — I10 Essential (primary) hypertension: Secondary | ICD-10-CM

## 2016-08-21 NOTE — Telephone Encounter (Signed)
Pt.notified

## 2016-08-21 NOTE — Telephone Encounter (Signed)
Labs ordered.

## 2016-08-22 ENCOUNTER — Other Ambulatory Visit: Payer: Self-pay | Admitting: Family Medicine

## 2016-09-08 ENCOUNTER — Ambulatory Visit (INDEPENDENT_AMBULATORY_CARE_PROVIDER_SITE_OTHER): Payer: Medicare Other | Admitting: Nurse Practitioner

## 2016-09-08 ENCOUNTER — Encounter: Payer: Self-pay | Admitting: Nurse Practitioner

## 2016-09-08 VITALS — BP 120/72 | Ht 68.0 in | Wt 233.0 lb

## 2016-09-08 DIAGNOSIS — E1122 Type 2 diabetes mellitus with diabetic chronic kidney disease: Secondary | ICD-10-CM | POA: Diagnosis not present

## 2016-09-08 DIAGNOSIS — Z23 Encounter for immunization: Secondary | ICD-10-CM

## 2016-09-08 DIAGNOSIS — N183 Chronic kidney disease, stage 3 (moderate): Secondary | ICD-10-CM

## 2016-09-08 DIAGNOSIS — I10 Essential (primary) hypertension: Secondary | ICD-10-CM

## 2016-09-08 DIAGNOSIS — E785 Hyperlipidemia, unspecified: Secondary | ICD-10-CM | POA: Diagnosis not present

## 2016-09-08 DIAGNOSIS — E039 Hypothyroidism, unspecified: Secondary | ICD-10-CM | POA: Diagnosis not present

## 2016-09-08 NOTE — Progress Notes (Signed)
Subjective:  Presents for recheck on chronic health problems. Had labs drawn this am. Active lifestyle. Has maintained her weight loss. BS on average range 129-180. Has been off Celexa for 2 weeks; doing fine. Wants to stop med. Gout has been stable. Has been taking Colcrys daily. Taking vitamin D daily. No CP/ischemic type pain or SOB. No edema.   Objective:   BP 120/72   Ht 5\' 8"  (1.727 m)   Wt 233 lb (105.7 kg)   BMI 35.43 kg/m  NAD. Alert, oriented. Cheerful affect. Lungs clear. Heart: RRR with Gr 3/6 soft, blowing murmur. (Known murmur; has been evaluated by cardiology).  Diabetic Foot Exam - Simple   Simple Foot Form Diabetic Foot exam was performed with the following findings:  Yes 09/08/2016  9:14 AM  Visual Inspection See comments:  Yes Sensation Testing Intact to touch and monofilament testing bilaterally:  Yes Pulse Check See comments:  Yes Comments Significant bunions both feet with hammer toes bilat. Strong DP pulses bilat with normal cap refill. Some thickened yellow nails noted. No skin breakdown.       Assessment:   Problem List Items Addressed This Visit      Cardiovascular and Mediastinum   Hypertension     Endocrine   Type 2 diabetes mellitus ([place]) - Primary    Other Visit Diagnoses    Need for vaccination       Relevant Orders   Pneumococcal polysaccharide vaccine 23-valent greater than or equal to 2yo subcutaneous/IM (Completed)       Plan:   Meds ordered this encounter  Medications  . glucose blood test strip    Sig: 1 each by Other route as needed for other. Use as instructed   Hold on Colcrys daily; use prn gout. Hold on Celexa. Labs pending. Continue activity and daily vitamin D. Pneumovax today. Given Rx for Shingrix. Strongly recommend PE.  Return in about 4 months (around 01/08/2017) for recheck; consider physical.

## 2016-09-09 ENCOUNTER — Other Ambulatory Visit: Payer: Self-pay | Admitting: Family Medicine

## 2016-09-09 LAB — CBC WITH DIFFERENTIAL/PLATELET
Basophils Absolute: 0 10*3/uL (ref 0.0–0.2)
Basos: 0 %
EOS (ABSOLUTE): 0.3 10*3/uL (ref 0.0–0.4)
EOS: 5 %
HEMATOCRIT: 33.2 % — AB (ref 34.0–46.6)
HEMOGLOBIN: 11.3 g/dL (ref 11.1–15.9)
Immature Grans (Abs): 0 10*3/uL (ref 0.0–0.1)
Immature Granulocytes: 0 %
LYMPHS ABS: 1.4 10*3/uL (ref 0.7–3.1)
Lymphs: 25 %
MCH: 29 pg (ref 26.6–33.0)
MCHC: 34 g/dL (ref 31.5–35.7)
MCV: 85 fL (ref 79–97)
MONOS ABS: 0.4 10*3/uL (ref 0.1–0.9)
Monocytes: 7 %
NEUTROS ABS: 3.6 10*3/uL (ref 1.4–7.0)
Neutrophils: 63 %
Platelets: 207 10*3/uL (ref 150–379)
RBC: 3.9 x10E6/uL (ref 3.77–5.28)
RDW: 15.8 % — AB (ref 12.3–15.4)
WBC: 5.7 10*3/uL (ref 3.4–10.8)

## 2016-09-09 LAB — HEMOGLOBIN A1C
Est. average glucose Bld gHb Est-mCnc: 157 mg/dL
Hgb A1c MFr Bld: 7.1 % — ABNORMAL HIGH (ref 4.8–5.6)

## 2016-09-09 LAB — BASIC METABOLIC PANEL
BUN / CREAT RATIO: 21 (ref 12–28)
BUN: 32 mg/dL — ABNORMAL HIGH (ref 8–27)
CO2: 25 mmol/L (ref 20–29)
CREATININE: 1.56 mg/dL — AB (ref 0.57–1.00)
Calcium: 9.8 mg/dL (ref 8.7–10.3)
Chloride: 98 mmol/L (ref 96–106)
GFR calc non Af Amer: 35 mL/min/{1.73_m2} — ABNORMAL LOW (ref >59)
GFR, EST AFRICAN AMERICAN: 40 mL/min/{1.73_m2} — AB (ref >59)
GLUCOSE: 134 mg/dL — AB (ref 65–99)
Potassium: 4.2 mmol/L (ref 3.5–5.2)
SODIUM: 137 mmol/L (ref 134–144)

## 2016-09-09 LAB — LIPID PANEL
CHOLESTEROL TOTAL: 179 mg/dL (ref 100–199)
Chol/HDL Ratio: 5.1 ratio — ABNORMAL HIGH (ref 0.0–4.4)
HDL: 35 mg/dL — ABNORMAL LOW (ref >39)
LDL Calculated: 114 mg/dL — ABNORMAL HIGH (ref 0–99)
Triglycerides: 148 mg/dL (ref 0–149)
VLDL Cholesterol Cal: 30 mg/dL (ref 5–40)

## 2016-09-09 LAB — HEPATIC FUNCTION PANEL
ALK PHOS: 72 IU/L (ref 39–117)
ALT: 16 IU/L (ref 0–32)
AST: 39 IU/L (ref 0–40)
Albumin: 4.6 g/dL (ref 3.6–4.8)
BILIRUBIN TOTAL: 0.5 mg/dL (ref 0.0–1.2)
BILIRUBIN, DIRECT: 0.13 mg/dL (ref 0.00–0.40)
Total Protein: 7.2 g/dL (ref 6.0–8.5)

## 2016-09-09 LAB — TSH: TSH: 0.703 u[IU]/mL (ref 0.450–4.500)

## 2016-09-09 LAB — URIC ACID: URIC ACID: 6.6 mg/dL (ref 2.5–7.1)

## 2016-09-12 ENCOUNTER — Other Ambulatory Visit: Payer: Self-pay | Admitting: Family Medicine

## 2016-09-22 ENCOUNTER — Other Ambulatory Visit: Payer: Self-pay | Admitting: Family Medicine

## 2016-10-02 ENCOUNTER — Telehealth: Payer: Self-pay | Admitting: Family Medicine

## 2016-10-02 MED ORDER — CITALOPRAM HYDROBROMIDE 20 MG PO TABS: 20.0000 mg | ORAL_TABLET | Freq: Every day | ORAL | 6 refills | Status: DC

## 2016-10-02 MED ORDER — HYDROCODONE-ACETAMINOPHEN 5-325 MG PO TABS: 1.0000 | ORAL_TABLET | Freq: Three times a day (TID) | ORAL | 0 refills | Status: DC | PRN

## 2016-10-02 NOTE — Telephone Encounter (Signed)
Requesting refill of Celexa and Hydrocodone to [facility].

## 2016-10-02 NOTE — Telephone Encounter (Signed)
Prescriptions were sent to pharmacy and patient is aware.

## 2016-10-02 NOTE — Telephone Encounter (Signed)
She may have Celexa completed/refill by 6 times-may have hydrocodone 30 tablets 3 times a day when necessary with this medication be regulated the patient will need to do every 3 month visits-part of pain medication protocols put forth by the state and the DEA- in order for medication to be given on a regular basis

## 2016-11-05 ENCOUNTER — Other Ambulatory Visit: Payer: Self-pay | Admitting: Family Medicine

## 2016-11-21 ENCOUNTER — Encounter: Payer: Self-pay | Admitting: Family Medicine

## 2016-11-21 ENCOUNTER — Ambulatory Visit (INDEPENDENT_AMBULATORY_CARE_PROVIDER_SITE_OTHER): Payer: Medicare Other | Admitting: Family Medicine

## 2016-11-21 VITALS — BP 122/82 | Ht 68.0 in | Wt 238.0 lb

## 2016-11-21 DIAGNOSIS — M778 Other enthesopathies, not elsewhere classified: Secondary | ICD-10-CM

## 2016-11-21 DIAGNOSIS — M7581 Other shoulder lesions, right shoulder: Secondary | ICD-10-CM

## 2016-11-21 MED ORDER — HYDROCODONE-ACETAMINOPHEN 5-325 MG PO TABS: 1.0000 | ORAL_TABLET | Freq: Three times a day (TID) | ORAL | 0 refills | Status: DC | PRN

## 2016-11-21 NOTE — Progress Notes (Signed)
   Subjective:    Patient ID: Sabrina Deleon, female    DOB: [birth year], 66 y.o.   MRN: [id number]  Shoulder Pain   Pain location: right shoulder. This is a new problem. Episode onset: 3 weeks.  Pain in the right shoulder present for about 3 weeks now to the point where she raise her arm she describes pain anterior shoulder side of the shoulder denies numbness or tingling into the hand denies any other particular troubles currently PMH benign   Review of Systems Denies breathing issues chest pain shortness of breath sweats chills fevers    Objective:   Physical Exam Lungs clear no crackles heart is regular pulse normal she does have tenderness in right shoulder with decreased range of motion unable to bring her arm up because of pain in the anterior deltoid region       Assessment & Plan:  Shoulder tendinitis I believe this morbid deltoid tendinitis Patient has elevated kidney function Cannot take anti-inflammatories May use pain medicine when necessary Gentle range of motion excises Injection of steroids given along the anterior deltoid tendons Care was taken not to inject the tendon If patient does not see significant improvement over the course of next 4-5 days referral to orthopedics for further evaluation and physical therapy

## 2016-12-17 ENCOUNTER — Other Ambulatory Visit: Payer: Self-pay | Admitting: Family Medicine

## 2016-12-17 ENCOUNTER — Other Ambulatory Visit: Payer: Self-pay | Admitting: Nurse Practitioner

## 2016-12-18 ENCOUNTER — Telehealth: Payer: Self-pay | Admitting: Family Medicine

## 2016-12-18 DIAGNOSIS — Z1159 Encounter for screening for other viral diseases: Secondary | ICD-10-CM

## 2016-12-18 DIAGNOSIS — Z114 Encounter for screening for human immunodeficiency virus [HIV]: Secondary | ICD-10-CM

## 2016-12-18 DIAGNOSIS — Z79899 Other long term (current) drug therapy: Secondary | ICD-10-CM

## 2016-12-18 DIAGNOSIS — E785 Hyperlipidemia, unspecified: Secondary | ICD-10-CM

## 2016-12-18 DIAGNOSIS — E119 Type 2 diabetes mellitus without complications: Secondary | ICD-10-CM

## 2016-12-18 NOTE — Telephone Encounter (Signed)
Orders ready. Pt notified.  

## 2016-12-18 NOTE — Telephone Encounter (Signed)
Last labs 09/08/16 lipid, lvier, uric acid, tsh, bmp, cbc, a1c

## 2016-12-18 NOTE — Telephone Encounter (Signed)
Metabolic 7, lipid, liver, hemoglobin A1c, urine ACR-renal insufficiency hyperlipidemia diabetes Also hep C antibody, HIV antibody for screening purposes

## 2016-12-18 NOTE — Telephone Encounter (Signed)
Pt is requesting lab orders to be sent over for an upcoming appt.

## 2016-12-22 DIAGNOSIS — Z1159 Encounter for screening for other viral diseases: Secondary | ICD-10-CM | POA: Diagnosis not present

## 2016-12-22 DIAGNOSIS — E119 Type 2 diabetes mellitus without complications: Secondary | ICD-10-CM | POA: Diagnosis not present

## 2016-12-22 DIAGNOSIS — E785 Hyperlipidemia, unspecified: Secondary | ICD-10-CM | POA: Diagnosis not present

## 2016-12-22 DIAGNOSIS — Z79899 Other long term (current) drug therapy: Secondary | ICD-10-CM | POA: Diagnosis not present

## 2016-12-23 LAB — MICROALBUMIN / CREATININE URINE RATIO
Creatinine, Urine: 47.3 mg/dL
Microalb/Creat Ratio: <6.3 mg/g creat (ref 0.0–30.0)
Microalbumin, Urine: <3 ug/mL

## 2016-12-23 LAB — BASIC METABOLIC PANEL
BUN/Creatinine Ratio: 23 (ref 12–28)
BUN: 37 mg/dL — ABNORMAL HIGH (ref 8–27)
CHLORIDE: 103 mmol/L (ref 96–106)
CO2: 25 mmol/L (ref 20–29)
Calcium: 9.6 mg/dL (ref 8.7–10.3)
Creatinine, Ser: 1.61 mg/dL — ABNORMAL HIGH (ref 0.57–1.00)
GFR calc Af Amer: 38 mL/min/{1.73_m2} — ABNORMAL LOW (ref >59)
GFR, EST NON AFRICAN AMERICAN: 33 mL/min/{1.73_m2} — AB (ref >59)
Glucose: 132 mg/dL — ABNORMAL HIGH (ref 65–99)
POTASSIUM: 5.4 mmol/L — AB (ref 3.5–5.2)
SODIUM: 141 mmol/L (ref 134–144)

## 2016-12-23 LAB — HIV ANTIBODY (ROUTINE TESTING W REFLEX): HIV SCREEN 4TH GENERATION: NONREACTIVE

## 2016-12-23 LAB — LIPID PANEL
CHOLESTEROL TOTAL: 164 mg/dL (ref 100–199)
Chol/HDL Ratio: 4.6 ratio — ABNORMAL HIGH (ref 0.0–4.4)
HDL: 36 mg/dL — AB (ref >39)
LDL CALC: 101 mg/dL — AB (ref 0–99)
Triglycerides: 134 mg/dL (ref 0–149)
VLDL CHOLESTEROL CAL: 27 mg/dL (ref 5–40)

## 2016-12-23 LAB — HEPATIC FUNCTION PANEL
ALBUMIN: 4.3 g/dL (ref 3.6–4.8)
ALK PHOS: 73 IU/L (ref 39–117)
ALT: 12 IU/L (ref 0–32)
AST: 31 IU/L (ref 0–40)
Bilirubin Total: 0.4 mg/dL (ref 0.0–1.2)
Bilirubin, Direct: 0.11 mg/dL (ref 0.00–0.40)
Total Protein: 7.2 g/dL (ref 6.0–8.5)

## 2016-12-23 LAB — HEPATITIS C ANTIBODY

## 2016-12-23 LAB — HEMOGLOBIN A1C
ESTIMATED AVERAGE GLUCOSE: 163 mg/dL
Hgb A1c MFr Bld: 7.3 % — ABNORMAL HIGH (ref 4.8–5.6)

## 2016-12-29 ENCOUNTER — Encounter: Payer: BLUE CROSS/BLUE SHIELD | Admitting: Nurse Practitioner

## 2017-01-02 ENCOUNTER — Other Ambulatory Visit (HOSPITAL_COMMUNITY)
Admission: RE | Admit: 2017-01-02 | Discharge: 2017-01-02 | Disposition: A | Discharge status: Home / Self Care | Payer: Medicare Other | Source: Intra-hospital | Attending: Family Medicine | Admitting: Family Medicine

## 2017-01-02 ENCOUNTER — Encounter: Payer: Self-pay | Admitting: Family Medicine

## 2017-01-02 ENCOUNTER — Ambulatory Visit (INDEPENDENT_AMBULATORY_CARE_PROVIDER_SITE_OTHER): Payer: Medicare Other | Admitting: Family Medicine

## 2017-01-02 ENCOUNTER — Telehealth: Payer: Self-pay | Admitting: Family Medicine

## 2017-01-02 VITALS — BP 100/68 | Temp 98.7°F | Ht 68.0 in | Wt 235.0 lb

## 2017-01-02 DIAGNOSIS — R1013 Epigastric pain: Secondary | ICD-10-CM | POA: Insufficient documentation

## 2017-01-02 DIAGNOSIS — K921 Melena: Secondary | ICD-10-CM

## 2017-01-02 DIAGNOSIS — K859 Acute pancreatitis without necrosis or infection, unspecified: Secondary | ICD-10-CM | POA: Diagnosis not present

## 2017-01-02 LAB — CBC WITH DIFFERENTIAL/PLATELET
BASOS PCT: 0 %
Basophils Absolute: 0 10*3/uL (ref 0.0–0.1)
Eosinophils Absolute: 0.2 10*3/uL (ref 0.0–0.7)
Eosinophils Relative: 2 %
HEMATOCRIT: 32.4 % — AB (ref 36.0–46.0)
HEMOGLOBIN: 10.4 g/dL — AB (ref 12.0–15.0)
LYMPHS PCT: 19 %
Lymphs Abs: 1.7 10*3/uL (ref 0.7–4.0)
MCH: 28.7 pg (ref 26.0–34.0)
MCHC: 32.1 g/dL (ref 30.0–36.0)
MCV: 89.3 fL (ref 78.0–100.0)
MONO ABS: 0.9 10*3/uL (ref 0.1–1.0)
MONOS PCT: 10 %
NEUTROS ABS: 6.1 10*3/uL (ref 1.7–7.7)
NEUTROS PCT: 69 %
Platelets: 220 10*3/uL (ref 150–400)
RBC: 3.63 MIL/uL — ABNORMAL LOW (ref 3.87–5.11)
RDW: 14.1 % (ref 11.5–15.5)
WBC: 8.9 10*3/uL (ref 4.0–10.5)

## 2017-01-02 LAB — HEPATIC FUNCTION PANEL
ALBUMIN: 4.1 g/dL (ref 3.5–5.0)
ALK PHOS: 63 U/L (ref 38–126)
ALT: 13 U/L — ABNORMAL LOW (ref 14–54)
AST: 32 U/L (ref 15–41)
BILIRUBIN TOTAL: 0.7 mg/dL (ref 0.3–1.2)
Bilirubin, Direct: 0.1 mg/dL (ref 0.1–0.5)
Indirect Bilirubin: 0.6 mg/dL (ref 0.3–0.9)
Total Protein: 7.9 g/dL (ref 6.5–8.1)

## 2017-01-02 LAB — BASIC METABOLIC PANEL
Anion gap: 12 (ref 5–15)
BUN: 35 mg/dL — AB (ref 6–20)
CHLORIDE: 100 mmol/L — AB (ref 101–111)
CO2: 25 mmol/L (ref 22–32)
Calcium: 9.5 mg/dL (ref 8.9–10.3)
Creatinine, Ser: 1.77 mg/dL — ABNORMAL HIGH (ref 0.44–1.00)
GFR calc Af Amer: 34 mL/min — ABNORMAL LOW (ref >60)
GFR calc non Af Amer: 29 mL/min — ABNORMAL LOW (ref >60)
Glucose, Bld: 131 mg/dL — ABNORMAL HIGH (ref 65–99)
Potassium: 4.4 mmol/L (ref 3.5–5.1)
SODIUM: 137 mmol/L (ref 135–145)

## 2017-01-02 LAB — LIPASE, BLOOD: Lipase: 41 U/L (ref 11–51)

## 2017-01-02 MED ORDER — HYDROCODONE-ACETAMINOPHEN 5-325 MG PO TABS: 1.0000 | ORAL_TABLET | Freq: Three times a day (TID) | ORAL | 0 refills | Status: DC | PRN

## 2017-01-02 MED ORDER — GLIPIZIDE 5 MG PO TABS: ORAL_TABLET | ORAL | 5 refills | Status: DC

## 2017-01-02 NOTE — Progress Notes (Signed)
   Subjective:    Patient ID: Sabrina Deleon, female    DOB: Apr 03, 1950, 66 y.o.   MRN: [id number]  Abdominal Pain  This is a new problem. The current episode started in the past 7 days.   Patient states pain in mid abd goes from one side to the other. Feel like she had last year when she had pancreatitis. Has some nausea, no vomiting, no diarrhea . Says is a constant pain. Says she has a wellness visit with you tomorrow, but could not wait another day as the pain was so bad. This patient relates intermittent abdominal pain does not radiate to her back. Feels some nausea no vomiting or diarrhea states the pain does intermittently wake her up at night she denies heartburn issues. She denies taking anti-inflammatories. Review of Systems  Gastrointestinal: Positive for abdominal pain.  She denies cough wheezing vomiting she denies diarrhea rectal bleeding denies chest tightness pressure pain denies back pain     Objective:   Physical Exam HEENT benign neck no masses lungs are clear no crackle restaurant rate is normal heart is regular no murmurs pulses normal abdomen is soft mild epigastric tenderness no guarding or rebound       Assessment & Plan:  Abdominal pressure discomfort I believe the patient may well have gastritis or pancreatitis we need to do stat lab work await the results may end up needing to have CT scan or ultrasound. Admission [facility] go ahead with PPI await the results may need EGD or gastroenterology consult if ongoing troubles

## 2017-01-02 NOTE — Telephone Encounter (Signed)
Pt to hold onglyza- start Glipizide 5 mg use 1/2 twice a day,#30,4 [facility]

## 2017-01-02 NOTE — Patient Instructions (Signed)
Stop onglyza

## 2017-01-02 NOTE — Addendum Note (Signed)
Addended by: Launa Grill on: 01/02/2017 04:42 PM   Modules accepted: Orders

## 2017-01-02 NOTE — Telephone Encounter (Signed)
Medication sent into pharmacy  

## 2017-01-03 ENCOUNTER — Ambulatory Visit: Payer: Medicare Other | Admitting: Nurse Practitioner

## 2017-01-03 ENCOUNTER — Other Ambulatory Visit: Payer: Self-pay

## 2017-01-03 ENCOUNTER — Other Ambulatory Visit: Payer: Self-pay | Admitting: Family Medicine

## 2017-01-03 DIAGNOSIS — K859 Acute pancreatitis without necrosis or infection, unspecified: Secondary | ICD-10-CM

## 2017-01-03 MED ORDER — PANTOPRAZOLE SODIUM 40 MG PO TBEC: 40.0000 mg | DELAYED_RELEASE_TABLET | Freq: Every day | ORAL | 5 refills | Status: DC

## 2017-01-03 MED ORDER — SUCRALFATE 1 G PO TABS: ORAL_TABLET | ORAL | 0 refills | Status: DC

## 2017-01-03 NOTE — Addendum Note (Signed)
Addended by: Karle Barr on: 01/03/2017 09:19 AM   Modules accepted: Orders

## 2017-01-05 ENCOUNTER — Telehealth: Payer: Self-pay | Admitting: Family Medicine

## 2017-01-05 ENCOUNTER — Ambulatory Visit (HOSPITAL_COMMUNITY)
Admission: RE | Admit: 2017-01-05 | Discharge: 2017-01-05 | Disposition: A | Discharge status: Home / Self Care | Payer: Medicare Other | Source: Ambulatory Visit | Attending: Family Medicine | Admitting: Family Medicine

## 2017-01-05 ENCOUNTER — Other Ambulatory Visit: Payer: Self-pay | Admitting: *Deleted

## 2017-01-05 DIAGNOSIS — K859 Acute pancreatitis without necrosis or infection, unspecified: Secondary | ICD-10-CM | POA: Diagnosis not present

## 2017-01-05 DIAGNOSIS — Z9049 Acquired absence of other specified parts of digestive tract: Secondary | ICD-10-CM | POA: Insufficient documentation

## 2017-01-05 DIAGNOSIS — R1013 Epigastric pain: Secondary | ICD-10-CM

## 2017-01-05 DIAGNOSIS — K921 Melena: Secondary | ICD-10-CM

## 2017-01-05 LAB — IFOBT (OCCULT BLOOD): IFOBT: NEGATIVE

## 2017-01-05 NOTE — Telephone Encounter (Signed)
Please see patient Abdominal Ultrasound results from today. Dr.Scott wanted me to have your result in his absence today.

## 2017-01-09 ENCOUNTER — Ambulatory Visit: Payer: Medicare Other | Admitting: Family Medicine

## 2017-01-19 ENCOUNTER — Other Ambulatory Visit: Payer: Self-pay | Admitting: Family Medicine

## 2017-01-19 NOTE — Telephone Encounter (Signed)
May have this +5 additional refills 

## 2017-01-31 DIAGNOSIS — M79641 Pain in right hand: Secondary | ICD-10-CM | POA: Diagnosis not present

## 2017-01-31 DIAGNOSIS — M180 Bilateral primary osteoarthritis of first carpometacarpal joints: Secondary | ICD-10-CM | POA: Diagnosis not present

## 2017-01-31 DIAGNOSIS — M79642 Pain in left hand: Secondary | ICD-10-CM | POA: Diagnosis not present

## 2017-01-31 DIAGNOSIS — M542 Cervicalgia: Secondary | ICD-10-CM | POA: Diagnosis not present

## 2017-01-31 DIAGNOSIS — M5412 Radiculopathy, cervical region: Secondary | ICD-10-CM | POA: Diagnosis not present

## 2017-02-02 ENCOUNTER — Telehealth: Payer: Self-pay | Admitting: Family Medicine

## 2017-02-02 MED ORDER — HYDROCODONE-ACETAMINOPHEN 5-325 MG PO TABS: 1.0000 | ORAL_TABLET | Freq: Three times a day (TID) | ORAL | 0 refills | Status: DC | PRN

## 2017-02-02 NOTE — Telephone Encounter (Signed)
Received verbal orders per Dr.Steve Luking- we may refill Hydrocodone time 1 prescription. Patient notified and verbalized understanding.

## 2017-02-02 NOTE — Telephone Encounter (Signed)
Pt is requesting a refill on HYDROcodone-acetaminophen (NORCO/VICODIN) 5-325 MG tablet   

## 2017-02-05 NOTE — Progress Notes (Signed)
Cardiology Office Note  Date: 02/06/2017   ID: Sabrina, Deleon August 25, 1950, MRN [id number]  PCP: Kathyrn Drown, MD   Primary Cardiologist: Rozann Lesches, MD   Chief Complaint  Patient presents with  . Aortic Stenosis    History of Present Illness: Sabrina Deleon is a 66 y.o. female last seen in November 2017.  She presents today for a routine follow-up visit.  She does not report any obvious increasing dyspnea on exertion, no angina, palpitations, or syncope.  She is having trouble with arthritic pain, following with an orthopedist.  Echocardiogram from November of last year showed LVEF 65-70% with overall moderate aortic stenosis, mean gradient 27 mmHg.  We went over these results today and discussed obtaining a follow-up study.  Also went over the natural history of aortic stenosis.  I personally reviewed her ECG today which shows sinus rhythm.   She continues to follow with Dr. Wolfgang Phoenix.  I reviewed her medications.  Past Medical History:  Diagnosis Date  . Aortic stenosis   . Arthritis   . Chronic kidney disease    Dr. Tami Ribas- noted increase kidney function levels- took pt off Metformin and started on Onglyza for Blood sugar control-pt being followed for this.  Marland Kitchen GERD (gastroesophageal reflux disease)   . Gout   . Hypertension   . Hypothyroidism   . Type 2 diabetes mellitus ([place])   . Wears glasses   . Wears partial dentures    Upper    Past Surgical History:  Procedure Laterality Date  . BACK SURGERY     Lumbar  . CARPAL TUNNEL RELEASE  11/02/2011   Procedure: CARPAL TUNNEL RELEASE;  Surgeon: Tennis Must, MD;  Location: [facility];  Service: Orthopedics;  Laterality: Right;  . CARPAL TUNNEL RELEASE Left 01/14/2015   Procedure: LEFT CARPAL TUNNEL RELEASE;  Surgeon: Leanora Cover, MD;  Location: [facility];  Service: Orthopedics;  Laterality: Left;  . CHOLECYSTECTOMY     laparoscopic  . COLONOSCOPY    . COLONOSCOPY N/A  11/12/2015   Procedure: COLONOSCOPY;  Surgeon: Rogene Houston, MD;  [facility];  Service: Endoscopy;  Laterality: N/A;  9:30  . ESOPHAGOGASTRODUODENOSCOPY N/A 11/12/2015   Procedure: ESOPHAGOGASTRODUODENOSCOPY (EGD);  Surgeon: Rogene Houston, MD;  Location: [facility];  Service: Endoscopy;  Laterality: N/A;  . TOTAL HIP ARTHROPLASTY Left 03/22/2016   Procedure: LEFT TOTAL HIP ARTHROPLASTY ANTERIOR APPROACH;  Surgeon: Gaynelle Arabian, MD;  Location: [facility];  Service: Orthopedics;  Laterality: Left;    Current Outpatient Medications  Medication Sig Dispense Refill  . allopurinol (ZYLOPRIM) 100 MG tablet take 1 tablet by mouth twice a day 60 tablet 2  . citalopram (CELEXA) 20 MG tablet Take 1 tablet (20 mg total) by mouth daily. 45 tablet 6  . colchicine 0.6 MG tablet Take 0.6 mg by mouth as needed.    . ferrous sulfate 325 (65 FE) MG tablet Take 325 mg by mouth daily with breakfast.    . furosemide (LASIX) 40 MG tablet take 1 tablet by mouth once daily 30 tablet 5  . glipiZIDE (GLUCOTROL) 5 MG tablet Take 1/2 tablet by mouth twice daily. 30 tablet 5  . glucose blood test strip 1 each by Other route as needed for other. Use as instructed    . HYDROcodone-acetaminophen (NORCO/VICODIN) 5-325 MG tablet Take 1 tablet 3 (three) times daily as needed by mouth. 30 tablet 0  . ketoconazole (NIZORAL) 2 % cream  Apply 1 application topically as needed for irritation.    Marland Kitchen levothyroxine (SYNTHROID, LEVOTHROID) 125 MCG tablet take 1 tablet by mouth once daily 90 tablet 1  . lisinopril (PRINIVIL,ZESTRIL) 20 MG tablet take 1 tablet by mouth once daily 90 tablet 0  . pantoprazole (PROTONIX) 40 MG tablet Take 1 tablet (40 mg total) by mouth daily. 30 tablet 5  . pravastatin (PRAVACHOL) 40 MG tablet take 1 tablet by mouth once daily 90 tablet 1  . sucralfate (CARAFATE) 1 g tablet Take 1 tablet by mouth three times a day for 14 days. 42 tablet 0  . zolpidem (AMBIEN) 10 MG tablet take 1 tablet by mouth  at bedtime for sleep 30 tablet 5   No current facility-administered medications for this visit.    Allergies:  Patient has no known allergies.   Social History: The patient  reports that she quit smoking about 35 years ago. Her smoking use included cigarettes. she has never used smokeless tobacco. She reports that she drinks alcohol. She reports that she does not use drugs.   ROS:  Please see the history of present illness. Otherwise, complete review of systems is positive for arthritic pains.  All other systems are reviewed and negative.   Physical Exam: VS:  BP 114/62   Pulse 69   Ht 5\' 9"  (1.753 m)   Wt 239 lb 6.4 oz (108.6 kg)   SpO2 97%   BMI 35.35 kg/m , BMI Body mass index is 35.35 kg/m.  Wt Readings from Last 3 Encounters:  02/06/17 239 lb 6.4 oz (108.6 kg)  01/02/17 235 lb (106.6 kg)  11/21/16 238 lb (108 kg)    General: Patient appears comfortable at rest. HEENT: Conjunctiva and lids normal, oropharynx clear. Neck: Supple, no elevated JVP or carotid bruits, no thyromegaly. Lungs: Clear to auscultation, nonlabored breathing at rest. Cardiac: Regular rate and rhythm, no S3, 3/6 systolic murmur, no pericardial rub. Abdomen: Soft, nontender, bowel sounds present, no guarding or rebound. Extremities: No pitting edema, distal pulses 2+. Skin: Warm and dry. Musculoskeletal: No kyphosis. Neuropsychiatric: Alert and oriented x3, affect grossly appropriate.  ECG: I personally reviewed the tracing from 02/02/2016 which showed sinus rhythm with leftward axis and nonspecific T wave changes.  Recent Labwork: 09/08/2016: TSH 0.703 01/02/2017: ALT 13; AST 32; BUN 35; Creatinine, Ser 1.77; Hemoglobin 10.4; Platelets 220; Potassium 4.4; Sodium 137     Component Value Date/Time   CHOL 164 12/22/2016 1016   TRIG 134 12/22/2016 1016   HDL 36 (L) 12/22/2016 1016   CHOLHDL 4.6 (H) 12/22/2016 1016   LDLCALC 101 (H) 12/22/2016 1016    Other Studies Reviewed Today:  Echocardiogram  02/17/2016: Study Conclusions  - Left ventricle: The cavity size was normal. Wall thickness was   increased in a pattern of mild LVH. Systolic function was   vigorous. The estimated ejection fraction was in the range of 65%   to 70%. Doppler parameters are consistent with abnormal left   ventricular relaxation (grade 1 diastolic dysfunction). Doppler   parameters are consistent with high ventricular filling pressure. - Aortic valve: Mildly to moderately calcified annulus. Trileaflet;   moderately thickened leaflets. There was moderate stenosis. Mean   gradient (S): 27 mm Hg. Valve area (VTI): 1.11 cm^2. Valve area   (Vmax): 1.11 cm^2. Valve area (Vmean): 1.03 cm^2. - Mitral valve: Mildly to moderately calcified annulus. Mildly   thickened leaflets . - Atrial septum: No defect or patent foramen ovale was identified. - Technically adequate study.  Assessment and Plan:  1.  Aortic stenosis, moderate based on echocardiogram from last year.  Plan to obtain a follow-up echocardiogram for reevaluation.  No obvious change in stamina or symptoms.  2.  Essential hypertension, on lisinopril.  Blood pressure is well controlled today.  3.  Hyperlipidemia, on Pravachol.  She continues to follow with Dr. Wolfgang Phoenix.  4.  Type 2 diabetes mellitus.  Recent hemoglobin A1c 7.3.  Current medicines were reviewed with the patient today.   Orders Placed This Encounter  Procedures  . EKG 12-Lead  . ECHOCARDIOGRAM COMPLETE    Disposition: Follow-up in 1 year.  Signed, Satira Sark, MD, [facility] 02/06/2017 2:18 PM    [facility] [place], [place],  [address] Phone: [phone]; Fax: [phone]

## 2017-02-06 ENCOUNTER — Encounter: Payer: Self-pay | Admitting: Cardiology

## 2017-02-06 ENCOUNTER — Telehealth: Payer: Self-pay | Admitting: Cardiology

## 2017-02-06 ENCOUNTER — Ambulatory Visit: Payer: BLUE CROSS/BLUE SHIELD | Admitting: Cardiology

## 2017-02-06 VITALS — BP 114/62 | HR 69 | Ht 69.0 in | Wt 239.4 lb

## 2017-02-06 DIAGNOSIS — E119 Type 2 diabetes mellitus without complications: Secondary | ICD-10-CM

## 2017-02-06 DIAGNOSIS — I10 Essential (primary) hypertension: Secondary | ICD-10-CM

## 2017-02-06 DIAGNOSIS — E782 Mixed hyperlipidemia: Secondary | ICD-10-CM

## 2017-02-06 DIAGNOSIS — I350 Nonrheumatic aortic (valve) stenosis: Secondary | ICD-10-CM

## 2017-02-06 NOTE — Patient Instructions (Signed)
Medication Instructions:  Your physician recommends that you continue on your current medications as directed. Please refer to the Current Medication list given to you today.  Labwork: NONE  Testing/Procedures: Your physician has requested that you have an echocardiogram. Echocardiography is a painless test that uses sound waves to create images of your heart. It provides your doctor with information about the size and shape of your heart and how well your heart's chambers and valves are working. This procedure takes approximately one hour. There are no restrictions for this procedure.  Follow-Up: Your physician wants you to follow-up in: 1 YEAR WITH DR. MCDOWELL. You will receive a reminder letter in the mail two months in advance. If you don't receive a letter, please call our office to schedule the follow-up appointment.  Any Other Special Instructions Will Be Listed Below (If Applicable).  If you need a refill on your cardiac medications before your next appointment, please call your pharmacy. 

## 2017-02-06 NOTE — Telephone Encounter (Signed)
Pre-cert Verification for the following procedure   Echo scheduled for 02/22/17

## 2017-02-16 DIAGNOSIS — M50323 Other cervical disc degeneration at C6-C7 level: Secondary | ICD-10-CM | POA: Diagnosis not present

## 2017-02-16 DIAGNOSIS — M4682 Other specified inflammatory spondylopathies, cervical region: Secondary | ICD-10-CM | POA: Diagnosis not present

## 2017-02-16 DIAGNOSIS — M50322 Other cervical disc degeneration at C5-C6 level: Secondary | ICD-10-CM | POA: Diagnosis not present

## 2017-02-16 DIAGNOSIS — M542 Cervicalgia: Secondary | ICD-10-CM | POA: Diagnosis not present

## 2017-02-22 ENCOUNTER — Ambulatory Visit (INDEPENDENT_AMBULATORY_CARE_PROVIDER_SITE_OTHER): Payer: Medicare Other

## 2017-02-22 ENCOUNTER — Other Ambulatory Visit: Payer: Self-pay

## 2017-02-22 ENCOUNTER — Telehealth: Payer: Self-pay

## 2017-02-22 DIAGNOSIS — I350 Nonrheumatic aortic (valve) stenosis: Secondary | ICD-10-CM

## 2017-02-22 NOTE — Telephone Encounter (Signed)
-----   Message from Merlene Laughter, LPN sent at 68/0/3212  3:06 PM EST -----   ----- Message ----- From: Satira Sark, MD Sent: 02/22/2017   2:20 PM To: Kathyrn Drown, MD, Merlene Laughter, LPN  Results reviewed.  Study indicates aortic stenosis in the moderate to severe range which is an increase compared to her previous study.  She was doing well clinically at the recent office visit.  Would recommend a follow-up office visit in 6 months with echocardiogram just prior to that visit for review. A copy of this test should be forwarded to Kathyrn Drown, MD.

## 2017-02-22 NOTE — Telephone Encounter (Signed)
Patient notified. Routed to PCP. Echo order placed and sent to scheduling for June. Recall placed

## 2017-02-23 ENCOUNTER — Telehealth: Payer: Self-pay

## 2017-02-23 DIAGNOSIS — M542 Cervicalgia: Secondary | ICD-10-CM | POA: Diagnosis not present

## 2017-02-23 MED ORDER — ZOLPIDEM TARTRATE 10 MG PO TABS: ORAL_TABLET | ORAL | 5 refills | Status: DC

## 2017-02-23 NOTE — Telephone Encounter (Signed)
It appears that a refill was done back on January 19, 2017 with 5 additional refills but please send it again 5 additional refills!!

## 2017-02-23 NOTE — Telephone Encounter (Signed)
Patient requesting a refill on her Ambien to be sent to [facility]. Last seen in 12/2016 for acute pancreatitis. Please advise.

## 2017-02-23 NOTE — Telephone Encounter (Signed)
I sent the medication to [facility]

## 2017-03-03 ENCOUNTER — Other Ambulatory Visit: Payer: Self-pay | Admitting: Nurse Practitioner

## 2017-03-05 ENCOUNTER — Telehealth: Payer: Self-pay | Admitting: Family Medicine

## 2017-03-05 ENCOUNTER — Other Ambulatory Visit: Payer: Self-pay | Admitting: Family Medicine

## 2017-03-05 MED ORDER — HYDROCODONE-ACETAMINOPHEN 5-325 MG PO TABS: 1.0000 | ORAL_TABLET | Freq: Three times a day (TID) | ORAL | 0 refills | Status: DC | PRN

## 2017-03-05 NOTE — Telephone Encounter (Signed)
Pt is aware.  

## 2017-03-05 NOTE — Telephone Encounter (Signed)
Requesting refill on hydrocodone 5-325 mg tab.

## 2017-03-05 NOTE — Telephone Encounter (Signed)
30 tablet prescription was written patient may use on a as needed basis patient will need follow-up office visit before next prescription per state guidelines

## 2017-03-06 DIAGNOSIS — M542 Cervicalgia: Secondary | ICD-10-CM | POA: Diagnosis not present

## 2017-03-06 DIAGNOSIS — M50323 Other cervical disc degeneration at C6-C7 level: Secondary | ICD-10-CM | POA: Diagnosis not present

## 2017-03-06 DIAGNOSIS — M4682 Other specified inflammatory spondylopathies, cervical region: Secondary | ICD-10-CM | POA: Diagnosis not present

## 2017-03-06 DIAGNOSIS — M50322 Other cervical disc degeneration at C5-C6 level: Secondary | ICD-10-CM | POA: Diagnosis not present

## 2017-03-09 ENCOUNTER — Other Ambulatory Visit: Payer: Self-pay

## 2017-03-09 ENCOUNTER — Ambulatory Visit (HOSPITAL_COMMUNITY): Payer: Medicare Other | Attending: Orthopedic Surgery

## 2017-03-09 ENCOUNTER — Encounter (HOSPITAL_COMMUNITY): Payer: Self-pay

## 2017-03-09 DIAGNOSIS — G8929 Other chronic pain: Secondary | ICD-10-CM | POA: Insufficient documentation

## 2017-03-09 DIAGNOSIS — M25512 Pain in left shoulder: Secondary | ICD-10-CM | POA: Insufficient documentation

## 2017-03-09 DIAGNOSIS — R29898 Other symptoms and signs involving the musculoskeletal system: Secondary | ICD-10-CM | POA: Diagnosis not present

## 2017-03-09 DIAGNOSIS — M542 Cervicalgia: Secondary | ICD-10-CM | POA: Diagnosis not present

## 2017-03-09 DIAGNOSIS — M6281 Muscle weakness (generalized): Secondary | ICD-10-CM | POA: Insufficient documentation

## 2017-03-09 DIAGNOSIS — M25511 Pain in right shoulder: Secondary | ICD-10-CM | POA: Insufficient documentation

## 2017-03-09 NOTE — Therapy (Addendum)
[facility] [address] [place], [name], [address] Phone: [phone]   Fax:  [phone]  Physical Therapy Evaluation  Patient Details  Name: Sabrina Deleon MRN: [id number] Date of Birth: 11/09/1950 Referring Provider: Melina Schools, MD   Encounter Date: 03/09/2017  PT End of Session - 03/09/17 1558    Visit Number  1    Number of Visits  13    Date for PT Re-Evaluation  03/30/17 mini reassess    Authorization Type  [employer] Medicare    Authorization Time Period  03/09/17 to 04/20/17    Authorization - Visit Number  1    Authorization - Number of Visits  10    PT Start Time  4401    PT Stop Time  1545    PT Time Calculation (min)  73 min    Activity Tolerance  Patient tolerated treatment well;Patient limited by pain    Behavior During Therapy  [facility] for tasks assessed/performed       Past Medical History:  Diagnosis Date  . Aortic stenosis   . Arthritis   . Chronic kidney disease    Dr. Tami Ribas- noted increase kidney function levels- took pt off Metformin and started on Onglyza for Blood sugar control-pt being followed for this.  Marland Kitchen GERD (gastroesophageal reflux disease)   . Gout   . Hypertension   . Hypothyroidism   . Type 2 diabetes mellitus ([place])   . Wears glasses   . Wears partial dentures    Upper    Past Surgical History:  Procedure Laterality Date  . BACK SURGERY     Lumbar  . CARPAL TUNNEL RELEASE  11/02/2011   Procedure: CARPAL TUNNEL RELEASE;  Surgeon: Tennis Must, MD;  Location: [facility];  Service: Orthopedics;  Laterality: Right;  . CARPAL TUNNEL RELEASE Left 01/14/2015   Procedure: LEFT CARPAL TUNNEL RELEASE;  Surgeon: Leanora Cover, MD;  Location: [facility];  Service: Orthopedics;  Laterality: Left;  . CHOLECYSTECTOMY     laparoscopic  . COLONOSCOPY    . COLONOSCOPY N/A 11/12/2015   Procedure: COLONOSCOPY;  Surgeon: Rogene Houston, MD;  [facility];  Service:  Endoscopy;  Laterality: N/A;  9:30  . ESOPHAGOGASTRODUODENOSCOPY N/A 11/12/2015   Procedure: ESOPHAGOGASTRODUODENOSCOPY (EGD);  Surgeon: Rogene Houston, MD;  [facility];  Service: Endoscopy;  Laterality: N/A;  . TOTAL HIP ARTHROPLASTY Left 03/22/2016   Procedure: LEFT TOTAL HIP ARTHROPLASTY ANTERIOR APPROACH;  Surgeon: Gaynelle Arabian, MD;  Location: [facility];  Service: Orthopedics;  Laterality: Left;    There were no vitals filed for this visit.   Subjective Assessment - 03/09/17 1441    Subjective  Pt states that most of the time she has pain down her L shoulder. She has cream for her arthritis which will help with some of her pain. She reports significant weakness in both of her arms. She's had an x-ray and MRI. She denies any n/t. She has difficulty hooking her bra, drying her hair, wiping herself; anything when she has to do [facility] or behind her back she has extreme difficulty with it. She does report that she's been having incontinence at night while sleeping which started a few months ago but it is not every night. She denies any saddle paresthesia. She went back to work in April 2018 and then in May 2018 her arms and hands started hurting and started getting weak. 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 Standing helps relieve her pain. Sleeping is difficult for her to do. She's had neck pain for over a year which started insidiously. She drops things a lot and has difficulty opening bottles.     Limitations  Sitting;House hold activities    How long can you sit comfortably?  30 mins and has to get up    How long can you stand comfortably?  position of comfort for neck but bothers her neck; 45-60 mins     How long can you walk comfortably?  no issues with neck    Diagnostic tests  x-ray, MRI    Patient Stated Goals  no pain, get back to what I'm supposed to do    Currently in Pain?  Yes    Pain Score  6  based off VAS    Pain Location  Arm    Pain Orientation  Left     Pain Descriptors / Indicators  Dull;Aching    Pain Type  Chronic pain    Pain Onset  More than a month ago    Pain Frequency  Constant    Aggravating Factors   sitting for too long, HBB, anything OH    Pain Relieving Factors  standing, higher up chairs    Effect of Pain on Daily Activities  significant impact         [facility] PT Assessment - 03/09/17 0001      Assessment   Medical Diagnosis  L shoulder pain, Degenerative intervertebral disc at C5-6 and C6-7; facet arthritis of cervical region    Referring Provider  Melina Schools, MD    Onset Date/Surgical Date  -- May 2018    Next MD Visit  04/06/17 with Dr. Stann Mainland (shoulder MD)    Prior Therapy  PT for hip      Precautions   Precautions  Anterior Hip    Precaution Comments  L hip THA on 03/22/16      Balance Screen   Has the patient fallen in the past 6 months  Yes    How many times?  1    Has the patient had a decrease in activity level because of a fear of falling?   Yes    Is the patient reluctant to leave their home because of a fear of falling?   No      Prior Function   Level of Independence  Independent    Vocation  Full time employment    Software engineer    Leisure  plays with grandkids (dance, scouts, etc.), chruch, piano      Cognition   Overall Cognitive Status  Within Functional Limits for tasks assessed      Observation/Other Assessments   Focus on Therapeutic Outcomes (FOTO)   59% limitation      Sensation   Light Touch  Appears Intact      Posture/Postural Control   Posture/Postural Control  Postural limitations    Postural Limitations  Rounded Shoulders;Forward head;Increased thoracic kyphosis;Increased lumbar lordosis      ROM / Strength   AROM / PROM / Strength  AROM;Strength;PROM      AROM   Overall AROM Comments  pain throughout all L GH AROM     AROM Assessment Site  Cervical;Shoulder    Right/Left Shoulder  Left;Right    Right Shoulder Flexion  145 Degrees    Right Shoulder  ABduction  150 Degrees    Right Shoulder Internal  Rotation  -- L3, increased trunk lean for compensation     Right Shoulder External Rotation  -- T3, compensated with cervical flexion    Left Shoulder Extension  -- assess next visit    Left Shoulder Flexion  110 Degrees    Left Shoulder ABduction  85 Degrees    Left Shoulder Internal Rotation  -- L3, increased trunk lean for compensation     Left Shoulder External Rotation  -- C7 but min GH flexion/did not take hand over head    Cervical Flexion  29    Cervical Extension  37    Cervical - Right Side Bend  28    Cervical - Left Side Bend  28    Cervical - Right Rotation  50 pain L>R    Cervical - Left Rotation  45 L shoulder pain      PROM   PROM Assessment Site  Shoulder    Right/Left Shoulder  Left    Left Shoulder Extension  -- assess next visit    Left Shoulder Flexion  90 Degrees very guarded, approximately 90 deg    Left Shoulder ABduction  90 Degrees minimal pain during PROM compared to AROM    Left Shoulder Internal Rotation  60 Degrees    Left Shoulder External Rotation  6 Degrees painful and guarded      Strength   Right Shoulder Flexion  4/5    Right Shoulder ABduction  4/5    Right Shoulder Internal Rotation  4+/5    Right Shoulder External Rotation  4/5    Left Shoulder Flexion  2+/5 unable to raise through full ROM    Left Shoulder ABduction  2+/5 unable to raise through full ROM    Left Shoulder Internal Rotation  4/5    Left Shoulder External Rotation  4+/5    Right Elbow Flexion  5/5    Right Elbow Extension  5/5    Left Elbow Flexion  5/5    Left Elbow Extension  5/5    Right Wrist Flexion  4+/5    Right Wrist Extension  5/5    Left Wrist Flexion  4+/5    Left Wrist Extension  4+/5    Right Hand Gross Grasp  Impaired    Right Hand Grip (lbs)  15    Right Hand Lateral Pinch  7 lbs    Left Hand Gross Grasp  Impaired    Left Hand Grip (lbs)  10    Left Hand Lateral Pinch  6 lbs      Palpation   Palpation  comment  increased soft tissue restrictions and tenderness to palpation throughout cervical and thoracic paraspinals, periscapular musculature, biceps and triceps mucle bellies L>R throughout as well as pec minor insertion on L      Special Tests    Special Tests  Cervical;Rotator Cuff Impingement;Laxity/Instability Tests    Cervical Tests  Spurling's;Dictraction    Rotator Cuff Impingment tests  Neer impingement test;Hawkins- Kennedy test;Lift- off test;Lag signs at 90 degrees;Drop Arm test      Spurling's   Findings  Negative    Comment  Spurling's to the R: felt into L shoulder; Spurling's on the L: felt a little bit on the R; no sharp shooting/radicular symptoms down respective limb when testing      Distraction Test   Findngs  Positive    Comment  no increased pain      Neer Impingement test    Findings  Positive  Side  Left    Comments  decreased pain, increased ROM      Hawkins-Kennedy test   Findings  Negative    Side  Left      Lift-Off test   Findings  Positive    Side  Left    Comment  immediate pain      Lag signs at 90 degrees    Findings  Negative    Side  Left      Drop Arm test   Findings  Negative    Side  Left    Comment  able to lower, no drop of arm occurred, min pain           Objective measurements completed on examination: See above findings.        PT Education - 03/09/17 1558    Education provided  Yes    Education Details  exam findings, POC, HEP    Person(s) Educated  Patient    Methods  Explanation;Demonstration    Comprehension  Verbalized understanding;Returned demonstration       PT Short Term Goals - 03/09/17 1619      PT SHORT TERM GOAL #1   Title  Pt will be independent with HEP and perform consistently in order to maximize return to PLOF.    Time  3    Period  Weeks    Status  New    Target Date  03/30/17      PT SHORT TERM GOAL #2   Title  Pt will have at least 5# increase in her gross grip strength of BUE to  allow her to open bottles with greater ease.     Time  3    Period  Weeks    Status  New      PT SHORT TERM GOAL #3   Title  Pt will have improved LUE AROM by 15 deg or > throughout to decrease pain and maximize her ability to dress herself.     Time  3    Period  Weeks    Status  New        PT Long Term Goals - 03/09/17 1621      PT LONG TERM GOAL #1   Title  Pt will have improved LUE AROM to [facility] and with 3/10 shoulder pain or < to maximize pt's ability to get items in/out of cabinets [place] and to donn her bra with greater ease.     Time  6    Period  Weeks    Status  New    Target Date  04/20/17      PT LONG TERM GOAL #2   Title  Pt will have improved cervical AROM by 15 deg or more throughout all ranges in order to decrease her pain maximize her ability to drive.     Time  6    Period  Weeks    Status  New      PT LONG TERM GOAL #3   Title  Pt will be able to demo proper sitting posture with lumbar roll at least 75% of the time in order to allow her to sit for at least 1 hour to maximize her function at work.     Time  6    Period  Weeks    Status  New      PT LONG TERM GOAL #4   Title  Pt's MMT will be at least 4+/5 throughout all muscle groups tested  and her gross grip strength will improve to at least 25# bilaterally in order to decrease her pain and allow her to cook and perform household chores with greater ease.     Time  6    Period  Weeks    Status  New      PT LONG TERM GOAL #5   Title  Pt will score 14 or < on the NDI in order to demo improved overall perceived disability from her neck pain.    Time  6    Period  Weeks    Status  New             Plan - 03/09/17 1604    Clinical Impression Statement  Pt is pleasant 66 YO F who presents to OPPT with c/o chronic neck and bil shoulder pain, L>R. 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 Pt denies any n/t down either UE, but states that her whole arm,  including her hands, can be painful. She also reports significant difficulty with her grip strength since this started. She currently has deficits A/PROM of LUE, MMT, grip strength, and posture, as well as increased pain, difficulty performing functional tasks, and soft tissue restrictions. Pt had positive bear hug test but all other RTC special tests negative on LUE. Neer's sign positive as she had more ROM and less pain with L GH flexion, indicating poor scapular stabilization. Pt PROM was much less painful than AROM indicating muscular component to pt's pain. Pt also significantly limited in [facility] ER. Normative values for grip strength in women 65-69 are 33-59# and pt today was 15# on the R and 10# on the L. Pt needs skilled PT intervention to address these impairments in order to decrease pain and maximize return to PLOF.     History and Personal Factors relevant to plan of care:  HTN, DM Type II, Gout, chronic kidney disease, sits a lot for work     Clinical Presentation  Stable    Clinical Presentation due to:  A/PROM, MMT, soft tissue restrictions, pain, posture, difficulty with functional tasks, grip strength    Clinical Decision Making  Low    Rehab Potential  Fair    PT Frequency  2x / week    PT Duration  6 weeks    PT Treatment/Interventions  ADLs/Self Care Home Management;Cryotherapy;Electrical Stimulation;Moist Heat;Functional mobility training;Therapeutic activities;Therapeutic exercise;Balance training;Neuromuscular re-education;Patient/family education;Manual techniques;Passive range of motion;Dry needling;Energy conservation;Taping;Splinting    PT Next Visit Plan  review goals, assess median/ulnar/radial nerve glides; measure L shoulder extension ROM; complete NDI; begin supine cervical retractions, PROM LUE (especially L GH ER), AAROM supine, manual for soft tissue restrictions, postural education; can try pec/bices stretching as well as UT, LS, and scalene stretching and assess response     PT Home Exercise Plan  eval: yellow putty    Consulted and Agree with Plan of Care  Patient       Patient will benefit from skilled therapeutic intervention in order to improve the following deficits and impairments:  Decreased activity tolerance, Decreased range of motion, Decreased strength, Hypomobility, Increased muscle spasms, Impaired flexibility, Impaired UE functional use, Postural dysfunction, Obesity, Pain  Visit Diagnosis: Chronic left shoulder pain - Plan: PT plan of care cert/re-cert  Cervicalgia - Plan: PT plan of care cert/re-cert  Chronic right shoulder pain - Plan: PT plan of care cert/re-cert  Muscle weakness (generalized) - Plan: PT plan of care cert/re-cert  Other  symptoms and signs involving the musculoskeletal system - Plan: PT plan of care cert/re-cert     G-codes PT G-Codes  Functional Assessment Tool Used (Outpatient Only) clinical judgement, objective testing performed clinical judgement, objective testing performed  Functional Limitation Carrying, moving and handling objects Carrying, moving and handling objects  Carrying, Moving and Handling Objects Current Status (Z1696) At least 60 percent but less than 80 percent impaired, limited or restricted At least 60 percent but less than 80 percent impaired, limited or restricted  Carrying, Moving and Handling Objects Goal Status (V8938) At least 1 percent but less than 20 percent impaired, limited or restricted At least 1 percent but less than 20 percent impaired, limited or restricted  Carrying, Moving and Handling Objects Discharge Status [phone])          Problem List Patient Active Problem List   Diagnosis Date Noted  . OA (osteoarthritis) of hip 03/22/2016  . Chronic right hip pain 10/29/2015  . Chronic kidney disease (CKD) stage G3b/A1, moderately decreased glomerular filtration rate (GFR) between 30-44 mL/min/1.73 square meter and albuminuria creatinine ratio less than 30 mg/g (HCC) 06/03/2015  . Type  2 diabetes mellitus ([place]) 11/09/2014  . Diastolic dysfunction, left ventricle 05/16/2013  . Calcific aortic stenosis 05/16/2013  . Gout 05/12/2013  . Diabetic retinopathy ([place]) 12/17/2012  . Hypertension 06/10/2012  . Type II or unspecified type diabetes mellitus without mention of complication, uncontrolled 06/10/2012  . Hyperlipidemia 06/10/2012  . Hypothyroidism 06/10/2012       Geraldine Solar PT, [facility] [address] [place], [name], [address] Phone: [phone]   Fax:  [phone]  Name: EMRY TOBIN MRN: [id number] Date of Birth: Nov 11, 1950

## 2017-03-15 ENCOUNTER — Ambulatory Visit (HOSPITAL_COMMUNITY): Payer: Medicare Other | Admitting: Physical Therapy

## 2017-03-15 DIAGNOSIS — G8929 Other chronic pain: Secondary | ICD-10-CM

## 2017-03-15 DIAGNOSIS — M25511 Pain in right shoulder: Secondary | ICD-10-CM | POA: Diagnosis not present

## 2017-03-15 DIAGNOSIS — M542 Cervicalgia: Secondary | ICD-10-CM | POA: Diagnosis not present

## 2017-03-15 DIAGNOSIS — M25512 Pain in left shoulder: Principal | ICD-10-CM

## 2017-03-15 DIAGNOSIS — R29898 Other symptoms and signs involving the musculoskeletal system: Secondary | ICD-10-CM | POA: Diagnosis not present

## 2017-03-15 DIAGNOSIS — M6281 Muscle weakness (generalized): Secondary | ICD-10-CM | POA: Diagnosis not present

## 2017-03-15 NOTE — Therapy (Signed)
Sabrina Deleon, [name], [address] Phone: [phone]   Fax:  [phone]  Physical Therapy Treatment  Patient Details  Name: Sabrina Deleon MRN: [id number] Date of Birth: October 12, 1950 Referring Provider: Melina Schools, MD   Encounter Date: 03/15/2017  PT End of Session - 03/15/17 1300    Visit Number  2    Number of Visits  13    Date for PT Re-Evaluation  03/30/17 mini reassess    Authorization Type  [employer] Medicare    Authorization Time Period  03/09/17 to 04/20/17    Authorization - Visit Number  2    Authorization - Number of Visits  10    PT Start Time  0815    PT Stop Time  0900    PT Time Calculation (min)  45 min    Activity Tolerance  Patient tolerated treatment well;Patient limited by pain    Behavior During Therapy  [facility] for tasks assessed/performed       Past Medical History:  Diagnosis Date  . Aortic stenosis   . Arthritis   . Chronic kidney disease    Dr. Tami Ribas- noted increase kidney function levels- took pt off Metformin and started on Onglyza for Blood sugar control-pt being followed for this.  Marland Kitchen GERD (gastroesophageal reflux disease)   . Gout   . Hypertension   . Hypothyroidism   . Type 2 diabetes mellitus ([place])   . Wears glasses   . Wears partial dentures    Upper    Past Surgical History:  Procedure Laterality Date  . BACK SURGERY     Lumbar  . CARPAL TUNNEL RELEASE  11/02/2011   Procedure: CARPAL TUNNEL RELEASE;  Surgeon: Sabrina Must, MD;  Location: [place];  Service: Orthopedics;  Laterality: Right;  . CARPAL TUNNEL RELEASE Left 01/14/2015   Procedure: LEFT CARPAL TUNNEL RELEASE;  Surgeon: Sabrina Cover, MD;  Location: [place];  Service: Orthopedics;  Laterality: Left;  . CHOLECYSTECTOMY     laparoscopic  . COLONOSCOPY    . COLONOSCOPY N/A 11/12/2015   Procedure: COLONOSCOPY;  Surgeon: Sabrina Houston, MD;  [facility];  Service:  Endoscopy;  Laterality: N/A;  9:30  . ESOPHAGOGASTRODUODENOSCOPY N/A 11/12/2015   Procedure: ESOPHAGOGASTRODUODENOSCOPY (EGD);  Surgeon: Sabrina Houston, MD;  [facility];  Service: Endoscopy;  Laterality: N/A;  . TOTAL HIP ARTHROPLASTY Left 03/22/2016   Procedure: LEFT TOTAL HIP ARTHROPLASTY ANTERIOR APPROACH;  Surgeon: Sabrina Arabian, MD;  [facility];  Service: Orthopedics;  Laterality: Left;    There were no vitals filed for this visit.  Subjective Assessment - 03/15/17 0846    Subjective  Pt states she felt really good after last session, however after 2 days of cooking and cleaning around the holidays her pain intensified.  currently 8/10 pain into Lt lateral UE and into her elbow.  Pain is in the same location on the Rt but at 6/10.  Reports complaince with theraputty at home.    Currently in Pain?  Yes    Pain Score  8     Pain Location  Arm    Pain Orientation  Left    Pain Descriptors / Indicators  Aching    Pain Type  Chronic pain         [facility] PT Assessment - 03/15/17 0001      Assessment   Medical Diagnosis  L shoulder pain, Degenerative intervertebral disc at  C5-6 and C6-7; facet arthritis of cervical region      Observation/Other Assessments   Neck Disability Index   25      AROM   Right Shoulder Extension  32 Degrees    Left Shoulder Extension  28 Degrees                  [facility] Adult PT Treatment/Exercise - 03/15/17 0001      Shoulder Exercises: Supine   External Rotation  AAROM;Both;5 reps;Limitations    External Rotation Limitations  1# wand    Flexion  AAROM;Both;5 reps    Flexion Limitations  1# wand    ABduction  AAROM;Both;5 reps    ABduction Limitations  1# wand      Shoulder Exercises: Stretch   Corner Stretch Limitations  3X30"       Manual Therapy   Manual Therapy  Myofascial release;Soft tissue mobilization;Passive ROM    Manual therapy comments  completed seperately from all other skilled interventions    Myofascial  Release  to decrease tight fascia and encourage increased mobility    Passive ROM  to Lt shoulder in all directions             PT Education - 03/15/17 1307    Education provided  Yes    Education Details  goals per intiial evaluation and HEP    Person(s) Educated  Patient    Methods  Explanation;Demonstration;Tactile cues;Verbal cues;Handout    Comprehension  Verbalized understanding;Returned demonstration;Verbal cues required;Tactile cues required       PT Short Term Goals - 03/09/17 1619      PT SHORT TERM GOAL #1   Title  Pt will be independent with HEP and perform consistently in order to maximize return to PLOF.    Time  3    Period  Weeks    Status  New    Target Date  03/30/17      PT SHORT TERM GOAL #2   Title  Pt will have at least 5# increase in her gross grip strength of BUE to allow her to open bottles with greater ease.     Time  3    Period  Weeks    Status  New      PT SHORT TERM GOAL #3   Title  Pt will have improved LUE AROM by 15 deg or > throughout to decrease pain and maximize her ability to dress herself.     Time  3    Period  Weeks    Status  New        PT Long Term Goals - 03/09/17 1621      PT LONG TERM GOAL #1   Title  Pt will have improved LUE AROM to [facility] and with 3/10 shoulder pain or < to maximize pt's ability to get items in/out of cabinets [place] and to donn her bra with greater ease.     Time  6    Period  Weeks    Status  New    Target Date  04/20/17      PT LONG TERM GOAL #2   Title  Pt will have improved cervical AROM by 15 deg or more throughout all ranges in order to decrease her pain maximize her ability to drive.     Time  6    Period  Weeks    Status  New      PT LONG TERM GOAL #3   Title  Pt will be able to demo proper sitting posture with lumbar roll at least 75% of the time in order to allow her to sit for at least 1 hour to maximize her function at work.     Time  6    Period  Weeks    Status  New      PT LONG  TERM GOAL #4   Title  Pt's MMT will be at least 4+/5 throughout all muscle groups tested and her gross grip strength will improve to at least 25# bilaterally in order to decrease her pain and allow her to cook and perform household chores with greater ease.     Time  6    Period  Weeks    Status  New      PT LONG TERM GOAL #5   Title  Pt will score 14 or < on the NDI in order to demo improved overall perceived disability from her neck pain.    Time  6    Period  Weeks    Status  New            Plan - 03/15/17 1300    Clinical Impression Statement  Reveiwed initial evaluation and HEP with patient.  NDI completed with score of 25/50.  Provided with postural education and began PROM for Lt shoulder.  AAROM added using 1# wand.  Pt with limited ROM due to discomfort in all planes of motion.  manual, including myofascial techniques completed to decrease overall tightness in tissue and improve mobilty.  Pt tolerated well.      Rehab Potential  Fair    PT Frequency  2x / week    PT Duration  6 weeks    PT Treatment/Interventions  ADLs/Self Care Home Management;Cryotherapy;Electrical Stimulation;Moist Heat;Functional mobility training;Therapeutic activities;Therapeutic exercise;Balance training;Neuromuscular re-education;Patient/family education;Manual techniques;Passive range of motion;Dry needling;Energy conservation;Taping;Splinting    PT Next Visit Plan  Assess median/ulnar/radial nerve glides and begin supine cervical retractions. continue to educate on posture and add stretches as appropriate.    PT Home Exercise Plan  eval: yellow putty    Consulted and Agree with Plan of Care  Patient       Patient will benefit from skilled therapeutic intervention in order to improve the following deficits and impairments:  Decreased activity tolerance, Decreased range of motion, Decreased strength, Hypomobility, Increased muscle spasms, Impaired flexibility, Impaired UE functional use, Postural  dysfunction, Obesity, Pain  Visit Diagnosis: Chronic left shoulder pain  Cervicalgia  Chronic right shoulder pain     Problem List Patient Active Problem List   Diagnosis Date Noted  . OA (osteoarthritis) of hip 03/22/2016  . Chronic right hip pain 10/29/2015  . Chronic kidney disease (CKD) stage G3b/A1, moderately decreased glomerular filtration rate (GFR) between 30-44 mL/min/1.73 square meter and albuminuria creatinine ratio less than 30 mg/g (HCC) 06/03/2015  . Type 2 diabetes mellitus ([place]) 11/09/2014  . Diastolic dysfunction, left ventricle 05/16/2013  . Calcific aortic stenosis 05/16/2013  . Gout 05/12/2013  . Diabetic retinopathy ([place]) 12/17/2012  . Hypertension 06/10/2012  . Type II or unspecified type diabetes mellitus without mention of complication, uncontrolled 06/10/2012  . Hyperlipidemia 06/10/2012  . Hypothyroidism 06/10/2012   Teena Irani, PTA/CLT [phone]  Teena Irani 03/15/2017, 1:52 PM  [place] [address] [place], [name], [address] Phone: [phone]   Fax:  [phone]  Name: JAYSHA LASURE MRN: [id number] Date of Birth: 1950-04-10

## 2017-03-16 ENCOUNTER — Ambulatory Visit (HOSPITAL_COMMUNITY): Payer: Medicare Other | Admitting: Physical Therapy

## 2017-03-16 ENCOUNTER — Other Ambulatory Visit: Payer: Self-pay

## 2017-03-16 ENCOUNTER — Encounter (HOSPITAL_COMMUNITY): Payer: Self-pay | Admitting: Physical Therapy

## 2017-03-16 DIAGNOSIS — M6281 Muscle weakness (generalized): Secondary | ICD-10-CM | POA: Diagnosis not present

## 2017-03-16 DIAGNOSIS — R29898 Other symptoms and signs involving the musculoskeletal system: Secondary | ICD-10-CM | POA: Diagnosis not present

## 2017-03-16 DIAGNOSIS — G8929 Other chronic pain: Secondary | ICD-10-CM | POA: Diagnosis not present

## 2017-03-16 DIAGNOSIS — M25512 Pain in left shoulder: Secondary | ICD-10-CM | POA: Diagnosis not present

## 2017-03-16 DIAGNOSIS — M542 Cervicalgia: Secondary | ICD-10-CM

## 2017-03-16 DIAGNOSIS — M25511 Pain in right shoulder: Secondary | ICD-10-CM

## 2017-03-16 NOTE — Therapy (Signed)
[place], [name], [address] Phone: [phone]   Fax:  [phone]  Physical Therapy Treatment  Patient Details  Name: Sabrina Deleon MRN: [id number] Date of Birth: 01/21/1951 Referring Provider: Melina Schools, MD   Encounter Date: 03/16/2017  PT End of Session - 03/16/17 1003    Visit Number  3    Number of Visits  13    Date for PT Re-Evaluation  03/30/17 mini reassess    Authorization Type  [employer] Medicare    Authorization Time Period  03/09/17 to 04/20/17    Authorization - Visit Number  3    Authorization - Number of Visits  10    PT Start Time  0901    PT Stop Time  0946    PT Time Calculation (min)  45 min    Activity Tolerance  Patient tolerated treatment well;Patient limited by pain    Behavior During Therapy  [facility] for tasks assessed/performed       Past Medical History:  Diagnosis Date  . Aortic stenosis   . Arthritis   . Chronic kidney disease    Dr. Tami Ribas- noted increase kidney function levels- took pt off Metformin and started on Onglyza for Blood sugar control-pt being followed for this.  Marland Kitchen GERD (gastroesophageal reflux disease)   . Gout   . Hypertension   . Hypothyroidism   . Type 2 diabetes mellitus ([place])   . Wears glasses   . Wears partial dentures    Upper    Past Surgical History:  Procedure Laterality Date  . BACK SURGERY     Lumbar  . CARPAL TUNNEL RELEASE  11/02/2011   Procedure: CARPAL TUNNEL RELEASE;  Surgeon: Tennis Must, MD;  Location: [place];  Service: Orthopedics;  Laterality: Right;  . CARPAL TUNNEL RELEASE Left 01/14/2015   Procedure: LEFT CARPAL TUNNEL RELEASE;  Surgeon: Leanora Cover, MD;  Location: [place];  Service: Orthopedics;  Laterality: Left;  . CHOLECYSTECTOMY     laparoscopic  . COLONOSCOPY    . COLONOSCOPY N/A 11/12/2015   Procedure: COLONOSCOPY;  Surgeon: Rogene Houston, MD;  [facility];  Service:  Endoscopy;  Laterality: N/A;  9:30  . ESOPHAGOGASTRODUODENOSCOPY N/A 11/12/2015   Procedure: ESOPHAGOGASTRODUODENOSCOPY (EGD);  Surgeon: Rogene Houston, MD;  [facility];  Service: Endoscopy;  Laterality: N/A;  . TOTAL HIP ARTHROPLASTY Left 03/22/2016   Procedure: LEFT TOTAL HIP ARTHROPLASTY ANTERIOR APPROACH;  Surgeon: Gaynelle Arabian, MD;  [facility];  Service: Orthopedics;  Laterality: Left;    There were no vitals filed for this visit.  Subjective Assessment - 03/16/17 0954    Subjective  Patient stated that she was at a 7/10 pain level in her left arm when she started therapy today and that she has had some sinus drainage, but that she feels okay. During neck retraction with 1 inch liftoff patient reported some dizziness, but which subsided after sitting up for a minute. Patient denied any history of serious dizziness and said she thinks it was just some congestion. Patient denied any drop-attacks or dizziness normally, but did report that starting this year she has not been able to sing as high as she used to. Patient stated that she felt that the session helped and that she felt better at the end.    Limitations  Sitting;House hold activities    Diagnostic tests  x-ray, MRI    Patient Stated Goals  no pain, get back to what I'm supposed to do    Currently in Pain?  Yes    Pain Score  7     Pain Location  Arm    Pain Orientation  Left    Pain Descriptors / Indicators  Aching    Pain Onset  More than a month ago                      [facility] Adult PT Treatment/Exercise - 03/16/17 0001      Neck Exercises: Supine   Neck Retraction  10 reps;5 secs Supine chin tuck     Capital Flexion  5 reps;3 secs    Capital Flexion Limitations  -- Supine chin tucks with 1 inch lift; pt dizzy so stopped      Shoulder Exercises: Supine   External Rotation  AAROM;Both;5 reps;Limitations    External Rotation Limitations  1# wand    Flexion  AAROM;Both;5 reps    Flexion  Limitations  1# wand    ABduction  AAROM;Both;5 reps    ABduction Limitations  1# wand      Shoulder Exercises: Stretch   Corner Stretch Limitations  3X30"       Manual Therapy   Manual Therapy  Soft tissue mobilization    Manual therapy comments  completed seperately from all other skilled interventions    Soft tissue mobilization  Performed seated to upper thoracic paraspinals, upper trapezius and levator scapulae    Myofascial Release  To promote relaxation, decrease pain, and improve functional mobility as a result             PT Education - 03/16/17 1000    Education provided  Yes    Education Details  Discussed with patient goals of soft tissue massage and exercises as well as described purpose of check reflexes.     Person(s) Educated  Patient    Methods  Explanation    Comprehension  Verbalized understanding       PT Short Term Goals - 03/09/17 1619      PT SHORT TERM GOAL #1   Title  Pt will be independent with HEP and perform consistently in order to maximize return to PLOF.    Time  3    Period  Weeks    Status  New    Target Date  03/30/17      PT SHORT TERM GOAL #2   Title  Pt will have at least 5# increase in her gross grip strength of BUE to allow her to open bottles with greater ease.     Time  3    Period  Weeks    Status  New      PT SHORT TERM GOAL #3   Title  Pt will have improved LUE AROM by 15 deg or > throughout to decrease pain and maximize her ability to dress herself.     Time  3    Period  Weeks    Status  New        PT Long Term Goals - 03/09/17 1621      PT LONG TERM GOAL #1   Title  Pt will have improved LUE AROM to [facility] and with 3/10 shoulder pain or < to maximize pt's ability to get items in/out of cabinets [place] and to donn her bra with greater ease.     Time  6    Period  Weeks    Status  New    Target Date  04/20/17      PT LONG TERM GOAL #2   Title  Pt will have improved cervical AROM by 15 deg or more throughout all  ranges in order to decrease her pain maximize her ability to drive.     Time  6    Period  Weeks    Status  New      PT LONG TERM GOAL #3   Title  Pt will be able to demo proper sitting posture with lumbar roll at least 75% of the time in order to allow her to sit for at least 1 hour to maximize her function at work.     Time  6    Period  Weeks    Status  New      PT LONG TERM GOAL #4   Title  Pt's MMT will be at least 4+/5 throughout all muscle groups tested and her gross grip strength will improve to at least 25# bilaterally in order to decrease her pain and allow her to cook and perform household chores with greater ease.     Time  6    Period  Weeks    Status  New      PT LONG TERM GOAL #5   Title  Pt will score 14 or < on the NDI in order to demo improved overall perceived disability from her neck pain.    Time  6    Period  Weeks    Status  New            Plan - 03/16/17 1016    Clinical Impression Statement  This session began with a focus on active assisted range of motion using the 1 pound wand requiring tactile and verbal cueing in order to perform with the proper range of motion. Added supine chin tucks without lift off x 10 with 5 second holds which patient tolerated well. Progressed to chin tucks with 1 inch lift off, but after 5 reps patient stated that she was dizzy due to congestion. Patient was brought to sitting and reported a return to baseline without [place] or dizziness after about 1 minute. Patient was asked if she felt well enough to continue and she stated that she now felt fine. Patient was asked if she had a history of dizziness, drop-attacks, or trouble with swallowing or speaking. Patient denied this, but stated that she did notice she wasn't able to sing as high starting this year. Tested reflex with hoffman's test which was negative bilaterally. Attempted ulnar nerve glide 1 time but it was very difficult for patient to achieve desired ROM and was painful  so discontinued. Ended session with soft tissue mobilization to the upper thoracic paraspinal muscles, upper trapezius, and levator scapulae. Patient reported decreased pain at the end of the session and stated that the session had helped. Plan to follow up about patient's progress and perform further neurological screen if patient continued to have any dizziness.     Rehab Potential  Fair    PT Frequency  2x / week    PT Duration  6 weeks    PT Treatment/Interventions  ADLs/Self Care Home Management;Cryotherapy;Electrical Stimulation;Moist Heat;Functional mobility training;Therapeutic activities;Therapeutic exercise;Balance training;Neuromuscular re-education;Patient/family education;Manual techniques;Passive range of motion;Dry needling;Energy conservation;Taping;Splinting    PT Next Visit Plan  Continue with chin tucks, assess chin tucks with lift off to see if it provokes symptoms again, consider more extensive neurological screening if dizziness symptoms persist. Continue  to educate on posture and add stretches as appropriate.    PT Home Exercise Plan  eval: yellow putty    Consulted and Agree with Plan of Care  Patient       Patient will benefit from skilled therapeutic intervention in order to improve the following deficits and impairments:  Decreased activity tolerance, Decreased range of motion, Decreased strength, Hypomobility, Increased muscle spasms, Impaired flexibility, Impaired UE functional use, Postural dysfunction, Obesity, Pain  Visit Diagnosis: Chronic left shoulder pain  Cervicalgia  Chronic right shoulder pain  Muscle weakness (generalized)  Other symptoms and signs involving the musculoskeletal system     Problem List Patient Active Problem List   Diagnosis Date Noted  . OA (osteoarthritis) of hip 03/22/2016  . Chronic right hip pain 10/29/2015  . Chronic kidney disease (CKD) stage G3b/A1, moderately decreased glomerular filtration rate (GFR) between 30-44  mL/min/1.73 square meter and albuminuria creatinine ratio less than 30 mg/g (HCC) 06/03/2015  . Type 2 diabetes mellitus ([place]) 11/09/2014  . Diastolic dysfunction, left ventricle 05/16/2013  . Calcific aortic stenosis 05/16/2013  . Gout 05/12/2013  . Diabetic retinopathy ([place]) 12/17/2012  . Hypertension 06/10/2012  . Type II or unspecified type diabetes mellitus without mention of complication, uncontrolled 06/10/2012  . Hyperlipidemia 06/10/2012  . Hypothyroidism 06/10/2012    Clarene Critchley PT, DPT 10:29 AM, 03/16/17 [facility] [address] [place], [name], [address] Phone: [phone]   Fax:  [phone]  Name: Sabrina Deleon MRN: [id number] Date of Birth: Sep 11, 1950

## 2017-03-20 ENCOUNTER — Other Ambulatory Visit: Payer: Self-pay | Admitting: Family Medicine

## 2017-03-21 ENCOUNTER — Ambulatory Visit (HOSPITAL_COMMUNITY): Payer: Medicare Other | Attending: Orthopedic Surgery

## 2017-03-21 ENCOUNTER — Encounter (HOSPITAL_COMMUNITY): Payer: Self-pay

## 2017-03-21 DIAGNOSIS — G8929 Other chronic pain: Secondary | ICD-10-CM | POA: Diagnosis not present

## 2017-03-21 DIAGNOSIS — M6281 Muscle weakness (generalized): Secondary | ICD-10-CM | POA: Insufficient documentation

## 2017-03-21 DIAGNOSIS — M542 Cervicalgia: Secondary | ICD-10-CM | POA: Diagnosis not present

## 2017-03-21 DIAGNOSIS — R29898 Other symptoms and signs involving the musculoskeletal system: Secondary | ICD-10-CM | POA: Insufficient documentation

## 2017-03-21 DIAGNOSIS — M25511 Pain in right shoulder: Secondary | ICD-10-CM | POA: Insufficient documentation

## 2017-03-21 DIAGNOSIS — M25512 Pain in left shoulder: Secondary | ICD-10-CM | POA: Insufficient documentation

## 2017-03-21 NOTE — Therapy (Signed)
[facility] [address] [place], [name], [address] Phone: [phone]   Fax:  [phone]  Physical Therapy Treatment  Patient Details  Name: Sabrina Deleon MRN: [id number] Date of Birth: [birth year] Referring Provider: Melina Schools, MD   Encounter Date: 03/21/2017  PT End of Session - 03/21/17 0812    Visit Number  4    Number of Visits  13    Date for PT Re-Evaluation  03/30/17 mini reassess    Authorization Type  [employer] Medicare    Authorization Time Period  03/09/17 to 04/20/17    Authorization - Visit Number  4    Authorization - Number of Visits  10    PT Start Time  0815    PT Stop Time  5638    PT Time Calculation (min)  42 min    Activity Tolerance  Patient tolerated treatment well;Patient limited by pain    Behavior During Therapy  [facility] for tasks assessed/performed       Past Medical History:  Diagnosis Date  . Aortic stenosis   . Arthritis   . Chronic kidney disease    Dr. Tami Ribas- noted increase kidney function levels- took pt off Metformin and started on Onglyza for Blood sugar control-pt being followed for this.  Marland Kitchen GERD (gastroesophageal reflux disease)   . Gout   . Hypertension   . Hypothyroidism   . Type 2 diabetes mellitus ([place])   . Wears glasses   . Wears partial dentures    Upper    Past Surgical History:  Procedure Laterality Date  . BACK SURGERY     Lumbar  . CARPAL TUNNEL RELEASE  11/02/2011   Procedure: CARPAL TUNNEL RELEASE;  Surgeon: Tennis Must, MD;  Location: [facility];  Service: Orthopedics;  Laterality: Right;  . CARPAL TUNNEL RELEASE Left 01/14/2015   Procedure: LEFT CARPAL TUNNEL RELEASE;  Surgeon: Leanora Cover, MD;  Location: [facility];  Service: Orthopedics;  Laterality: Left;  . CHOLECYSTECTOMY     laparoscopic  . COLONOSCOPY    . COLONOSCOPY N/A 11/12/2015   Procedure: COLONOSCOPY;  Surgeon: Rogene Houston, MD;  [facility];  Service:  Endoscopy;  Laterality: N/A;  9:30  . ESOPHAGOGASTRODUODENOSCOPY N/A 11/12/2015   Procedure: ESOPHAGOGASTRODUODENOSCOPY (EGD);  Surgeon: Rogene Houston, MD;  [facility];  Service: Endoscopy;  Laterality: N/A;  . TOTAL HIP ARTHROPLASTY Left 03/22/2016   Procedure: LEFT TOTAL HIP ARTHROPLASTY ANTERIOR APPROACH;  Surgeon: Gaynelle Arabian, MD;  [facility];  Service: Orthopedics;  Laterality: Left;    There were no vitals filed for this visit.  Subjective Assessment - 03/21/17 0814    Subjective  Pt states that she is feeling pretty good today but she had a rough past couple of days. She said that she cleaned out closets Saturday and felt good, but then Sunday night and Monday night she was in such extreme pain that she could not hardly move it or sleep.     Limitations  Sitting;House hold activities    Diagnostic tests  x-ray, MRI    Patient Stated Goals  no pain, get back to what I'm supposed to do    Currently in Pain?  Yes    Pain Score  5     Pain Location  Shoulder    Pain Orientation  Left    Pain Descriptors / Indicators  Aching    Pain Type  Chronic pain  Pain Onset  More than a month ago    Pain Frequency  Constant    Aggravating Factors   sitting for too long, HBB, anything OH    Pain Relieving Factors  standing, hight up chairs    Effect of Pain on Daily Activities  significant impact            [facility] Adult PT Treatment/Exercise - 03/21/17 0001      Neck Exercises: Supine   Neck Retraction  10 reps;5 secs    Capital Flexion  5 reps;3 secs      Shoulder Exercises: ROM/Strengthening   UBE (Upper Arm Bike)  x3 mins retro for postural strengthening      Shoulder Exercises: Stretch   Other Shoulder Stretches  bil upper trap stretch 2x30" each      Manual Therapy   Manual Therapy  Joint mobilization;Soft tissue mobilization;Manual Traction    Manual therapy comments  completed seperately from all other skilled interventions    Joint Mobilization  Grade  I-II inferior and posterior GH joint mobs in approximately 20 deg abd each on LUE; Grade II central PAs to cervical and thoracic spine    Soft tissue mobilization  L teres minor/infraspinatus insertion point    Manual Traction  L GH distraction            PT Education - 03/21/17 0901    Education provided  Yes    Education Details  may have increased tenderness following manual; updated HEP    Person(s) Educated  Patient    Methods  Explanation;Handout;Demonstration    Comprehension  Verbalized understanding;Returned demonstration       PT Short Term Goals - 03/09/17 1619      PT SHORT TERM GOAL #1   Title  Pt will be independent with HEP and perform consistently in order to maximize return to PLOF.    Time  3    Period  Weeks    Status  New    Target Date  03/30/17      PT SHORT TERM GOAL #2   Title  Pt will have at least 5# increase in her gross grip strength of BUE to allow her to open bottles with greater ease.     Time  3    Period  Weeks    Status  New      PT SHORT TERM GOAL #3   Title  Pt will have improved LUE AROM by 15 deg or > throughout to decrease pain and maximize her ability to dress herself.     Time  3    Period  Weeks    Status  New        PT Long Term Goals - 03/09/17 1621      PT LONG TERM GOAL #1   Title  Pt will have improved LUE AROM to [facility] and with 3/10 shoulder pain or < to maximize pt's ability to get items in/out of cabinets [place] and to donn her bra with greater ease.     Time  6    Period  Weeks    Status  New    Target Date  04/20/17      PT LONG TERM GOAL #2   Title  Pt will have improved cervical AROM by 15 deg or more throughout all ranges in order to decrease her pain maximize her ability to drive.     Time  6    Period  Weeks  Status  New      PT LONG TERM GOAL #3   Title  Pt will be able to demo proper sitting posture with lumbar roll at least 75% of the time in order to allow her to sit for at least 1 hour to maximize  her function at work.     Time  6    Period  Weeks    Status  New      PT LONG TERM GOAL #4   Title  Pt's MMT will be at least 4+/5 throughout all muscle groups tested and her gross grip strength will improve to at least 25# bilaterally in order to decrease her pain and allow her to cook and perform household chores with greater ease.     Time  6    Period  Weeks    Status  New      PT LONG TERM GOAL #5   Title  Pt will score 14 or < on the NDI in order to demo improved overall perceived disability from her neck pain.    Time  6    Period  Weeks    Status  New            Plan - 03/21/17 0902    Clinical Impression Statement  Introduced pt to UBE with good tolerance this date. Pt reporting that the corner stretch recreated her same shoulder pain so discontinued it. Attempted doorway stretch but pt reported the same symptoms. Pt did not have any symptoms with capital flexion this date, but PT taking this exercise out of the plan as it is a little too advanced for her currently. Pt reported no pain with [place] distraction but was noted to be hypomobile with inferior and posterior [place] mobs, with pain due to PT's hand placement. Cervical and thoracic spine hypomobile and increased tenderness, especially of thoracic spine. Updated HEP. Continue POC as planned. Pt 0/10 pain at EOS    Rehab Potential  Fair    PT Frequency  2x / week    PT Duration  6 weeks    PT Treatment/Interventions  ADLs/Self Care Home Management;Cryotherapy;Electrical Stimulation;Moist Heat;Functional mobility training;Therapeutic activities;Therapeutic exercise;Balance training;Neuromuscular re-education;Patient/family education;Manual techniques;Passive range of motion;Dry needling;Energy conservation;Taping;Splinting    PT Next Visit Plan  continue manual for mobility, STM, pain control; cotninue GH PROM/AAROM in supine; Continue to educate on posture and add stretches as appropriate.    PT Home Exercise Plan  eval: yellow  putty; 1/2: supine cervical retractions, supine GH flexion AAROM with wand    Consulted and Agree with Plan of Care  Patient       Patient will benefit from skilled therapeutic intervention in order to improve the following deficits and impairments:  Decreased activity tolerance, Decreased range of motion, Decreased strength, Hypomobility, Increased muscle spasms, Impaired flexibility, Impaired UE functional use, Postural dysfunction, Obesity, Pain  Visit Diagnosis: Chronic left shoulder pain  Cervicalgia  Chronic right shoulder pain  Muscle weakness (generalized)  Other symptoms and signs involving the musculoskeletal system     Problem List Patient Active Problem List   Diagnosis Date Noted  . OA (osteoarthritis) of hip 03/22/2016  . Chronic right hip pain 10/29/2015  . Chronic kidney disease (CKD) stage G3b/A1, moderately decreased glomerular filtration rate (GFR) between 30-44 mL/min/1.73 square meter and albuminuria creatinine ratio less than 30 mg/g (HCC) 06/03/2015  . Type 2 diabetes mellitus ([place]) 11/09/2014  . Diastolic dysfunction, left ventricle 05/16/2013  . Calcific aortic stenosis 05/16/2013  .  Gout 05/12/2013  . Diabetic retinopathy ([place]) 12/17/2012  . Hypertension 06/10/2012  . Type II or unspecified type diabetes mellitus without mention of complication, uncontrolled 06/10/2012  . Hyperlipidemia 06/10/2012  . Hypothyroidism 06/10/2012       Geraldine Solar PT, [facility] [address] [place], [name], [address] Phone: [phone]   Fax:  [phone]  Name: Sabrina Deleon MRN: [id number] Date of Birth: Sep 24, 1950

## 2017-03-21 NOTE — Patient Instructions (Signed)
  CERVICAL CHIN TUCK  AND RETRACTION - SUPINE WITH TOWEL  While lying on your back with a small folded up towel under your head, tuck your chin towards your chest. Also, focus on putting pressure on the towel with the back of your head.   Maintain contact of head with the towel the entire time.   Perform 1x/day, 2-3 sets of 10 holding for 5 seconds each   Cane Active Shoulder Flexion in supine  Grasp the cane about shoulder width. Bring it up over your head, trying to stretch the affected shoulder, holding for a 2 count and then bring the cane back to the resting position.  Perform 1x/day, 2-3 sets of 10 reps

## 2017-03-23 ENCOUNTER — Ambulatory Visit (HOSPITAL_COMMUNITY): Payer: Medicare Other

## 2017-03-23 DIAGNOSIS — R29898 Other symptoms and signs involving the musculoskeletal system: Secondary | ICD-10-CM

## 2017-03-23 DIAGNOSIS — M6281 Muscle weakness (generalized): Secondary | ICD-10-CM | POA: Diagnosis not present

## 2017-03-23 DIAGNOSIS — G8929 Other chronic pain: Secondary | ICD-10-CM

## 2017-03-23 DIAGNOSIS — M25512 Pain in left shoulder: Secondary | ICD-10-CM | POA: Diagnosis not present

## 2017-03-23 DIAGNOSIS — M25511 Pain in right shoulder: Secondary | ICD-10-CM

## 2017-03-23 DIAGNOSIS — M542 Cervicalgia: Secondary | ICD-10-CM

## 2017-03-23 NOTE — Therapy (Signed)
[facility] [address] [place], [name], [address] Phone: [phone]   Fax:  [phone]  Physical Therapy Treatment  Patient Details  Name: Sabrina Deleon MRN: [id number] Date of Birth: 1950-12-20 Referring Provider: Melina Schools, MD   Encounter Date: 03/23/2017  PT End of Session - 03/23/17 1114    Visit Number  5    Number of Visits  13    Date for PT Re-Evaluation  03/30/17 mini reassess    Authorization Type  [employer] Medicare    Authorization Time Period  03/09/17 to 04/20/17    Authorization - Visit Number  5    Authorization - Number of Visits  10    PT Start Time  1117    PT Stop Time  1204    PT Time Calculation (min)  47 min    Activity Tolerance  Patient tolerated treatment well;Patient limited by pain    Behavior During Therapy  [facility] for tasks assessed/performed       Past Medical History:  Diagnosis Date  . Aortic stenosis   . Arthritis   . Chronic kidney disease    Dr. Tami Ribas- noted increase kidney function levels- took pt off Metformin and started on Onglyza for Blood sugar control-pt being followed for this.  Marland Kitchen GERD (gastroesophageal reflux disease)   . Gout   . Hypertension   . Hypothyroidism   . Type 2 diabetes mellitus ([place])   . Wears glasses   . Wears partial dentures    Upper    Past Surgical History:  Procedure Laterality Date  . BACK SURGERY     Lumbar  . CARPAL TUNNEL RELEASE  11/02/2011   Procedure: CARPAL TUNNEL RELEASE;  Surgeon: Tennis Must, MD;  Location: [facility];  Service: Orthopedics;  Laterality: Right;  . CARPAL TUNNEL RELEASE Left 01/14/2015   Procedure: LEFT CARPAL TUNNEL RELEASE;  Surgeon: Leanora Cover, MD;  Location: [facility];  Service: Orthopedics;  Laterality: Left;  . CHOLECYSTECTOMY     laparoscopic  . COLONOSCOPY    . COLONOSCOPY N/A 11/12/2015   Procedure: COLONOSCOPY;  Surgeon: Rogene Houston, MD;  [facility];  Service:  Endoscopy;  Laterality: N/A;  9:30  . ESOPHAGOGASTRODUODENOSCOPY N/A 11/12/2015   Procedure: ESOPHAGOGASTRODUODENOSCOPY (EGD);  Surgeon: Rogene Houston, MD;  [facility];  Service: Endoscopy;  Laterality: N/A;  . TOTAL HIP ARTHROPLASTY Left 03/22/2016   Procedure: LEFT TOTAL HIP ARTHROPLASTY ANTERIOR APPROACH;  Surgeon: Gaynelle Arabian, MD;  [facility];  Service: Orthopedics;  Laterality: Left;    There were no vitals filed for this visit.  Subjective Assessment - 03/23/17 1115    Subjective  Pt states that her back has felt the best it's ever felt and her L shoulder was sore and she had minimal pain following manual last session. However, her L shoulder is really bothering her this morning; she thinks it could be the weather.    Limitations  Sitting;House hold activities    Diagnostic tests  x-ray, MRI    Patient Stated Goals  no pain, get back to what I'm supposed to do    Currently in Pain?  Yes    Pain Score  5     Pain Location  Shoulder    Pain Orientation  Left    Pain Descriptors / Indicators  Aching    Pain Type  Chronic pain    Pain Onset  More than a month  ago    Pain Frequency  Constant    Aggravating Factors   sitting for too long, HBB, anything [place]    Pain Relieving Factors  standing, heigh up chairs    Effect of Pain on Daily Activities  significant impact            [facility] Adult PT Treatment/Exercise - 03/23/17 0001      Neck Exercises: Supine   Neck Retraction  10 reps;5 secs      Shoulder Exercises: ROM/Strengthening   UBE (Upper Arm Bike)  x4 mins retro for postural strengthening      Manual Therapy   Manual Therapy  Joint mobilization;Soft tissue mobilization;Manual Traction    Manual therapy comments  completed seperately from all other skilled interventions    Joint Mobilization  Grade I-II posterior GH joint mobs in approximately 20 deg abd each on LUE; Grade II central PAs to cervical and thoracic spine; Scapular mobs all directions     Soft tissue mobilization  L teres minor/infraspinatus insertion point    Manual Traction  L GH distraction            PT Education - 03/23/17 1115    Education provided  Yes    Person(s) Educated  Patient    Methods  Explanation;Demonstration    Comprehension  Verbalized understanding;Returned demonstration       PT Short Term Goals - 03/09/17 1619      PT SHORT TERM GOAL #1   Title  Pt will be independent with HEP and perform consistently in order to maximize return to PLOF.    Time  3    Period  Weeks    Status  New    Target Date  03/30/17      PT SHORT TERM GOAL #2   Title  Pt will have at least 5# increase in her gross grip strength of BUE to allow her to open bottles with greater ease.     Time  3    Period  Weeks    Status  New      PT SHORT TERM GOAL #3   Title  Pt will have improved LUE AROM by 15 deg or > throughout to decrease pain and maximize her ability to dress herself.     Time  3    Period  Weeks    Status  New        PT Long Term Goals - 03/09/17 1621      PT LONG TERM GOAL #1   Title  Pt will have improved LUE AROM to [facility] and with 3/10 shoulder pain or < to maximize pt's ability to get items in/out of cabinets [place] and to donn her bra with greater ease.     Time  6    Period  Weeks    Status  New    Target Date  04/20/17      PT LONG TERM GOAL #2   Title  Pt will have improved cervical AROM by 15 deg or more throughout all ranges in order to decrease her pain maximize her ability to drive.     Time  6    Period  Weeks    Status  New      PT LONG TERM GOAL #3   Title  Pt will be able to demo proper sitting posture with lumbar roll at least 75% of the time in order to allow her to sit for at least 1 hour to  maximize her function at work.     Time  6    Period  Weeks    Status  New      PT LONG TERM GOAL #4   Title  Pt's MMT will be at least 4+/5 throughout all muscle groups tested and her gross grip strength will improve to at least 25#  bilaterally in order to decrease her pain and allow her to cook and perform household chores with greater ease.     Time  6    Period  Weeks    Status  New      PT LONG TERM GOAL #5   Title  Pt will score 14 or < on the NDI in order to demo improved overall perceived disability from her neck pain.    Time  6    Period  Weeks    Status  New            Plan - 03/23/17 1214    Clinical Impression Statement  Pt presents to therapy with slightly increased pain this date, but, she states that following her treatment session on Wednesday, she felt great. Continued with manual this date since she responded so well to it last treatment. Pt continues with increased restrictions of teres minor/infraspinatus muscle bellies and insertion point. Pt's thoracic spine continues to be hypomobile. Assessed scapulothoracic joint this date and pt noted to by hypomobile bilaterally, L>R; will address this in future sessions. Attempted sidelying GH flexion but pt still with difficulty and pain performing, even with scapular stabilization. Pt reported feeling better at EOS.     Rehab Potential  Fair    PT Frequency  2x / week    PT Duration  6 weeks    PT Treatment/Interventions  ADLs/Self Care Home Management;Cryotherapy;Electrical Stimulation;Moist Heat;Functional mobility training;Therapeutic activities;Therapeutic exercise;Balance training;Neuromuscular re-education;Patient/family education;Manual techniques;Passive range of motion;Dry needling;Energy conservation;Taping;Splinting    PT Next Visit Plan  continue manual for mobility, STM, pain control; perform scapular mobs in all directions (LUE > RUE); trial adding suboccipital release + gentle traction; continue [place] PROM/AAROM in supine; Continue to educate on posture and add stretches as appropriate.    PT Home Exercise Plan  eval: yellow putty; 1/2: supine cervical retractions, supine GH flexion AAROM with wand    Consulted and Agree with Plan of Care   Patient       Patient will benefit from skilled therapeutic intervention in order to improve the following deficits and impairments:  Decreased activity tolerance, Decreased range of motion, Decreased strength, Hypomobility, Increased muscle spasms, Impaired flexibility, Impaired UE functional use, Postural dysfunction, Obesity, Pain  Visit Diagnosis: Chronic left shoulder pain  Cervicalgia  Chronic right shoulder pain  Muscle weakness (generalized)  Other symptoms and signs involving the musculoskeletal system     Problem List Patient Active Problem List   Diagnosis Date Noted  . OA (osteoarthritis) of hip 03/22/2016  . Chronic right hip pain 10/29/2015  . Chronic kidney disease (CKD) stage G3b/A1, moderately decreased glomerular filtration rate (GFR) between 30-44 mL/min/1.73 square meter and albuminuria creatinine ratio less than 30 mg/g (HCC) 06/03/2015  . Type 2 diabetes mellitus ([place]) 11/09/2014  . Diastolic dysfunction, left ventricle 05/16/2013  . Calcific aortic stenosis 05/16/2013  . Gout 05/12/2013  . Diabetic retinopathy ([place]) 12/17/2012  . Hypertension 06/10/2012  . Type II or unspecified type diabetes mellitus without mention of complication, uncontrolled 06/10/2012  . Hyperlipidemia 06/10/2012  . Hypothyroidism 06/10/2012       Sabrina Deleon  PT, DPT  [facility] [address] [place], [name], [address] Phone: [phone]   Fax:  [phone]  Name: Sabrina Deleon MRN: [id number] Date of Birth: 1950-08-19

## 2017-03-25 ENCOUNTER — Other Ambulatory Visit: Payer: Self-pay | Admitting: Family Medicine

## 2017-03-26 ENCOUNTER — Ambulatory Visit (HOSPITAL_COMMUNITY): Payer: Medicare Other | Admitting: Physical Therapy

## 2017-03-26 DIAGNOSIS — M25511 Pain in right shoulder: Secondary | ICD-10-CM

## 2017-03-26 DIAGNOSIS — R29898 Other symptoms and signs involving the musculoskeletal system: Secondary | ICD-10-CM | POA: Diagnosis not present

## 2017-03-26 DIAGNOSIS — G8929 Other chronic pain: Secondary | ICD-10-CM

## 2017-03-26 DIAGNOSIS — M6281 Muscle weakness (generalized): Secondary | ICD-10-CM | POA: Diagnosis not present

## 2017-03-26 DIAGNOSIS — M542 Cervicalgia: Secondary | ICD-10-CM

## 2017-03-26 DIAGNOSIS — M25512 Pain in left shoulder: Principal | ICD-10-CM

## 2017-03-26 NOTE — Therapy (Signed)
[facility] [address] [place], [name], [address] Phone: [phone]   Fax:  [phone]  Physical Therapy Treatment  Patient Details  Name: Sabrina Deleon MRN: [id number] Date of Birth: 12-Aug-1950 Referring Provider: Melina Schools, MD   Encounter Date: 03/26/2017  PT End of Session - 03/26/17 1740    Visit Number  6    Number of Visits  13    Date for PT Re-Evaluation  03/30/17 mini reassess    Authorization Type  [employer] Medicare    Authorization Time Period  03/09/17 to 04/20/17    Authorization - Visit Number  6    Authorization - Number of Visits  10    PT Start Time  4081    PT Stop Time  1735    PT Time Calculation (min)  45 min    Activity Tolerance  Patient tolerated treatment well;Patient limited by pain    Behavior During Therapy  [facility] for tasks assessed/performed       Past Medical History:  Diagnosis Date  . Aortic stenosis   . Arthritis   . Chronic kidney disease    Dr. Tami Ribas- noted increase kidney function levels- took pt off Metformin and started on Onglyza for Blood sugar control-pt being followed for this.  Marland Kitchen GERD (gastroesophageal reflux disease)   . Gout   . Hypertension   . Hypothyroidism   . Type 2 diabetes mellitus ([place])   . Wears glasses   . Wears partial dentures    Upper    Past Surgical History:  Procedure Laterality Date  . BACK SURGERY     Lumbar  . CARPAL TUNNEL RELEASE  11/02/2011   Procedure: CARPAL TUNNEL RELEASE;  Surgeon: Tennis Must, MD;  Location: [place];  Service: Orthopedics;  Laterality: Right;  . CARPAL TUNNEL RELEASE Left 01/14/2015   Procedure: LEFT CARPAL TUNNEL RELEASE;  Surgeon: Leanora Cover, MD;  Location: [place];  Service: Orthopedics;  Laterality: Left;  . CHOLECYSTECTOMY     laparoscopic  . COLONOSCOPY    . COLONOSCOPY N/A 11/12/2015   Procedure: COLONOSCOPY;  Surgeon: Rogene Houston, MD;  [facility];  Service:  Endoscopy;  Laterality: N/A;  9:30  . ESOPHAGOGASTRODUODENOSCOPY N/A 11/12/2015   Procedure: ESOPHAGOGASTRODUODENOSCOPY (EGD);  Surgeon: Rogene Houston, MD;  [facility];  Service: Endoscopy;  Laterality: N/A;  . TOTAL HIP ARTHROPLASTY Left 03/22/2016   Procedure: LEFT TOTAL HIP ARTHROPLASTY ANTERIOR APPROACH;  Surgeon: Gaynelle Arabian, MD;  [facility];  Service: Orthopedics;  Laterality: Left;    There were no vitals filed for this visit.  Subjective Assessment - 03/26/17 1705    Subjective  Pt states her Lt shoulder and neck are basically the same but having alot of problems with her hips/LB to the point she's having difficulty getting out of a chair    Currently in Pain?  Yes    Pain Score  4     Pain Location  Shoulder    Pain Orientation  Left    Pain Descriptors / Indicators  Aching    Pain Type  Chronic pain                      [facility] Adult PT Treatment/Exercise - 03/26/17 0001      Neck Exercises: Supine   Neck Retraction  10 reps;5 secs      Shoulder Exercises: Supine   External Rotation  AAROM;Both;Limitations;10 reps    External Rotation Limitations  2# wand    Flexion  AAROM;Both;10 reps    Flexion Limitations  2# wand    ABduction  AAROM;Both;10 reps    ABduction Limitations  2# wand      Shoulder Exercises: Sidelying   Other Sidelying Exercises  scapular mobes      Shoulder Exercises: Pulleys   Flexion  1 minute      Shoulder Exercises: ROM/Strengthening   UBE (Upper Arm Bike)  x4 mins retro for postural strengthening      Manual Therapy   Manual Therapy  Joint mobilization;Soft tissue mobilization;Manual Traction    Manual therapy comments  completed seperately from all other skilled interventions    Joint Mobilization  Grade I-II posterior GH joint mobs in approximately 20 deg abd each on LUE; Grade II central PAs to cervical and thoracic spine; Scapular mobs all directions    Soft tissue mobilization  L teres  minor/infraspinatus insertion point    Myofascial Release  To promote relaxation, decrease pain, and improve functional mobility as a result    Passive ROM  to Lt shoulder in all directions               PT Short Term Goals - 03/09/17 1619      PT SHORT TERM GOAL #1   Title  Pt will be independent with HEP and perform consistently in order to maximize return to PLOF.    Time  3    Period  Weeks    Status  New    Target Date  03/30/17      PT SHORT TERM GOAL #2   Title  Pt will have at least 5# increase in her gross grip strength of BUE to allow her to open bottles with greater ease.     Time  3    Period  Weeks    Status  New      PT SHORT TERM GOAL #3   Title  Pt will have improved LUE AROM by 15 deg or > throughout to decrease pain and maximize her ability to dress herself.     Time  3    Period  Weeks    Status  New        PT Long Term Goals - 03/09/17 1621      PT LONG TERM GOAL #1   Title  Pt will have improved LUE AROM to [facility] and with 3/10 shoulder pain or < to maximize pt's ability to get items in/out of cabinets OH and to donn her bra with greater ease.     Time  6    Period  Weeks    Status  New    Target Date  04/20/17      PT LONG TERM GOAL #2   Title  Pt will have improved cervical AROM by 15 deg or more throughout all ranges in order to decrease her pain maximize her ability to drive.     Time  6    Period  Weeks    Status  New      PT LONG TERM GOAL #3   Title  Pt will be able to demo proper sitting posture with lumbar roll at least 75% of the time in order to allow her to sit for at least 1 hour to maximize her function at work.     Time  6    Period  Weeks    Status  New      PT LONG TERM GOAL #4   Title  Pt's MMT will be at least 4+/5 throughout all muscle groups tested and her gross grip strength will improve to at least 25# bilaterally in order to decrease her pain and allow her to cook and perform household chores with greater ease.      Time  6    Period  Weeks    Status  New      PT LONG TERM GOAL #5   Title  Pt will score 14 or < on the NDI in order to demo improved overall perceived disability from her neck pain.    Time  6    Period  Weeks    Status  New            Plan - 03/26/17 1741    Clinical Impression Statement  Continued to focus on improving ROM of Lt shoulder and decreasing pain.  Resumed AAROM exericses using wand and continued with manual including myofascial tecniques and mobilizations.  Added sidelying scap mobes as well with good results.  Pt reported having no pain at EOS.     Rehab Potential  Fair    PT Frequency  2x / week    PT Duration  6 weeks    PT Treatment/Interventions  ADLs/Self Care Home Management;Cryotherapy;Electrical Stimulation;Moist Heat;Functional mobility training;Therapeutic activities;Therapeutic exercise;Balance training;Neuromuscular re-education;Patient/family education;Manual techniques;Passive range of motion;Dry needling;Energy conservation;Taping;Splinting    PT Next Visit Plan  continue manual for mobility, STM, pain control; perform scapular mobs in all directions (LUE > RUE); trial adding suboccipital release + gentle traction; continue [place] PROM/AAROM in supine; Continue to educate on posture and add stretches as appropriate.    PT Home Exercise Plan  eval: yellow putty; 1/2: supine cervical retractions, supine GH flexion AAROM with wand    Consulted and Agree with Plan of Care  Patient       Patient will benefit from skilled therapeutic intervention in order to improve the following deficits and impairments:  Decreased activity tolerance, Decreased range of motion, Decreased strength, Hypomobility, Increased muscle spasms, Impaired flexibility, Impaired UE functional use, Postural dysfunction, Obesity, Pain  Visit Diagnosis: Chronic left shoulder pain  Cervicalgia  Chronic right shoulder pain     Problem List Patient Active Problem List   Diagnosis Date  Noted  . OA (osteoarthritis) of hip 03/22/2016  . Chronic right hip pain 10/29/2015  . Chronic kidney disease (CKD) stage G3b/A1, moderately decreased glomerular filtration rate (GFR) between 30-44 mL/min/1.73 square meter and albuminuria creatinine ratio less than 30 mg/g (HCC) 06/03/2015  . Type 2 diabetes mellitus ([place]) 11/09/2014  . Diastolic dysfunction, left ventricle 05/16/2013  . Calcific aortic stenosis 05/16/2013  . Gout 05/12/2013  . Diabetic retinopathy ([place]) 12/17/2012  . Hypertension 06/10/2012  . Type II or unspecified type diabetes mellitus without mention of complication, uncontrolled 06/10/2012  . Hyperlipidemia 06/10/2012  . Hypothyroidism 06/10/2012   Teena Irani, PTA/CLT [phone]  Teena Irani 03/26/2017, 5:44 PM  [facility] [address] [place], [name], [address] Phone: [phone]   Fax:  [phone]  Name: Sabrina Deleon MRN: [id number] Date of Birth: [birth year]

## 2017-03-30 ENCOUNTER — Ambulatory Visit (HOSPITAL_COMMUNITY): Payer: Medicare Other

## 2017-03-30 ENCOUNTER — Encounter (HOSPITAL_COMMUNITY): Payer: Self-pay

## 2017-03-30 DIAGNOSIS — M6281 Muscle weakness (generalized): Secondary | ICD-10-CM

## 2017-03-30 DIAGNOSIS — G8929 Other chronic pain: Secondary | ICD-10-CM

## 2017-03-30 DIAGNOSIS — M542 Cervicalgia: Secondary | ICD-10-CM | POA: Diagnosis not present

## 2017-03-30 DIAGNOSIS — M25512 Pain in left shoulder: Principal | ICD-10-CM

## 2017-03-30 DIAGNOSIS — M25511 Pain in right shoulder: Secondary | ICD-10-CM | POA: Diagnosis not present

## 2017-03-30 DIAGNOSIS — R29898 Other symptoms and signs involving the musculoskeletal system: Secondary | ICD-10-CM

## 2017-03-30 NOTE — Therapy (Signed)
[name], [name], [address] Phone: [phone]   Fax:  [phone]  Physical Therapy Treatment  Patient Details  Name: Sabrina Deleon MRN: [id number] Date of Birth: [birth year] Referring Provider: Melina Schools, MD   Encounter Date: 03/30/2017  PT End of Session - 03/30/17 0902    Visit Number  7    Number of Visits  13    Date for PT Re-Evaluation  04/20/17    Authorization Type  [employer] Medicare    Authorization Time Period  03/09/17 to 04/20/17    Authorization - Visit Number  7    Authorization - Number of Visits  10    PT Start Time  0900    PT Stop Time  0940    PT Time Calculation (min)  40 min    Activity Tolerance  Patient tolerated treatment well;Patient limited by pain    Behavior During Therapy  [facility] for tasks assessed/performed       Past Medical History:  Diagnosis Date  . Aortic stenosis   . Arthritis   . Chronic kidney disease    Dr. Tami Ribas- noted increase kidney function levels- took pt off Metformin and started on Onglyza for Blood sugar control-pt being followed for this.  Marland Kitchen GERD (gastroesophageal reflux disease)   . Gout   . Hypertension   . Hypothyroidism   . Type 2 diabetes mellitus ([place])   . Wears glasses   . Wears partial dentures    Upper    Past Surgical History:  Procedure Laterality Date  . BACK SURGERY     Lumbar  . CARPAL TUNNEL RELEASE  11/02/2011   Procedure: CARPAL TUNNEL RELEASE;  Surgeon: Tennis Must, MD;  Location: [facility];  Service: Orthopedics;  Laterality: Right;  . CARPAL TUNNEL RELEASE Left 01/14/2015   Procedure: LEFT CARPAL TUNNEL RELEASE;  Surgeon: Leanora Cover, MD;  Location: [facility];  Service: Orthopedics;  Laterality: Left;  . CHOLECYSTECTOMY     laparoscopic  . COLONOSCOPY    . COLONOSCOPY N/A 11/12/2015   Procedure: COLONOSCOPY;  Surgeon: Rogene Houston, MD;  [facility];  Service: Endoscopy;   Laterality: N/A;  9:30  . ESOPHAGOGASTRODUODENOSCOPY N/A 11/12/2015   Procedure: ESOPHAGOGASTRODUODENOSCOPY (EGD);  Surgeon: Rogene Houston, MD;  [facility];  Service: Endoscopy;  Laterality: N/A;  . TOTAL HIP ARTHROPLASTY Left 03/22/2016   Procedure: LEFT TOTAL HIP ARTHROPLASTY ANTERIOR APPROACH;  Surgeon: Gaynelle Arabian, MD;  [facility];  Service: Orthopedics;  Laterality: Left;    There were no vitals filed for this visit.  Subjective Assessment - 03/30/17 0905    Subjective  Pt states that she can start to see some improvements overall. She has not had as intense pain but she still can't get her LUE to the back of her head.    Currently in Pain?  Yes    Pain Score  2     Pain Location  Elbow    Pain Orientation  Left    Pain Descriptors / Indicators  Aching    Pain Type  Chronic pain    Pain Onset  More than a month ago    Pain Frequency  Constant    Aggravating Factors   sitting for too long, HBB, anything OH    Pain Relieving Factors  standing, heigh up chairs    Effect of Pain on Daily Activities  significant impact  [facility] PT Assessment - 03/30/17 0001      Assessment   Medical Diagnosis  L shoulder pain, Degenerative intervertebral disc at C5-6 and C6-7; facet arthritis of cervical region    Referring Provider  Melina Schools, MD    Next MD Visit  04/06/17      Observation/Other Assessments   Neck Disability Index   -- was 25      AROM   Right Shoulder Extension  -- was 32    Left Shoulder Extension  45 Degrees was 28    Left Shoulder Flexion  112 Degrees 5/10 pain in L shoulder; was 110    Left Shoulder ABduction  75 Degrees increased pain; was 85    Left Shoulder Internal Rotation  -- L5, decreased compensation; was L3 + trunk compensation    Left Shoulder External Rotation  -- C7, able to go OH;was C7 but min GH flexion/hand not OH    Cervical Flexion  45 was 29    Cervical Extension  34 min pain; was 37    Cervical - Right Side Bend  28  was 28    Cervical - Left Side Bend  34 was 28    Cervical - Right Rotation  63 5/10 on R side neck; was 50 with L sided pain    Cervical - Left Rotation  56 5/10 on R side neck; was 45 with L shoulder pain      PROM   Left Shoulder Flexion  -- was 90    Left Shoulder ABduction  90 Degrees guarded, pain at end range; was 90    Left Shoulder External Rotation  15 Degrees was 6      Strength   Right Shoulder Flexion  4/5 was 4    Right Shoulder ABduction  4/5 was 4    Right Shoulder Internal Rotation  4+/5 was 4+    Right Shoulder External Rotation  4+/5 was 4    Left Shoulder Flexion  2+/5 stll can't get through full ROM; was 2+    Left Shoulder ABduction  2+/5 stll can't get through full ROM; was 2+    Left Shoulder Internal Rotation  4+/5 was 4    Left Shoulder External Rotation  4+/5 was 4+    Right Wrist Flexion  5/5 was 4+    Left Wrist Flexion  4+/5 was 4+    Left Wrist Extension  5/5 was 4+    Right Hand Grip (lbs)  23 was 15    Right Hand Lateral Pinch  -- was 7    Left Hand Grip (lbs)  15 was 10    Left Hand Lateral Pinch  -- was 6           PT Education - 03/30/17 0902    Education provided  Yes    Education Details  reassessment findings    Person(s) Educated  Patient    Methods  Explanation;Demonstration    Comprehension  Verbalized understanding;Returned demonstration       PT Short Term Goals - 03/30/17 0903      PT SHORT TERM GOAL #1   Title  Pt will be independent with HEP and perform consistently in order to maximize return to PLOF.    Baseline  1/11: performing them as much as she can but not every day    Time  3    Period  Weeks    Status  Partially Met      PT SHORT TERM  GOAL #2   Title  Pt will have at least 5# increase in her gross grip strength of BUE to allow her to open bottles with greater ease.     Baseline  1/11: R: 23#, L: 25#    Time  3    Period  Weeks    Status  Achieved      PT SHORT TERM GOAL #3   Title  Pt will have improved  LUE AROM by 15 deg or > throughout to decrease pain and maximize her ability to dress herself.     Baseline  1/11: L GH IR/ER improved as she had decreased trunk compensations    Time  3    Period  Weeks    Status  On-going        PT Long Term Goals - 03/30/17 3903      PT LONG TERM GOAL #1   Title  Pt will have improved LUE AROM to [facility] and with 3/10 shoulder pain or < to maximize pt's ability to get items in/out of cabinets OH and to donn her bra with greater ease.     Time  6    Period  Weeks    Status  On-going      PT LONG TERM GOAL #2   Title  Pt will have improved cervical AROM by 15 deg or more throughout all ranges in order to decrease her pain maximize her ability to drive.     Baseline  1/11: cervical flexion improved by >15 deg, all others improved but was <15 deg    Time  6    Period  Weeks    Status  On-going      PT LONG TERM GOAL #3   Title  Pt will be able to demo proper sitting posture with lumbar roll at least 75% of the time in order to allow her to sit for at least 1 hour to maximize her function at work.     Baseline  1/11: states she feels she can sit for 1 hour at work with proper posture and then has to get up    Time  6    Period  Weeks    Status  Achieved      PT LONG TERM GOAL #4   Title  Pt's MMT will be at least 4+/5 throughout all muscle groups tested and her gross grip strength will improve to at least 25# bilaterally in order to decrease her pain and allow her to cook and perform household chores with greater ease.     Baseline  1/11: has made some improvements     Time  6    Period  Weeks    Status  On-going      PT LONG TERM GOAL #5   Title  Pt will score 14 or < on the NDI in order to demo improved overall perceived disability from her neck pain.    Time  6    Period  Weeks    Status  New            Plan - 03/30/17 0944    Clinical Impression Statement  PT reassessed pt's goals and outcome measures this date. Pt states that she  can finally start to see some improvements as her constant pain has started to decrease as it is only when she is doing something with her LUE; she's not having pain at rest anymore. She states she still can't do anything behind  her back with her LUE due to pain. Her cervical AROM has improved in all directions, her grip strength has improved by at least 5# bil, and her LUE AROM was less painful this date. Overall, feel pt's current POC of P/AAROM, manual, and postural education/strengthening is effective and should be continued going forward.     Rehab Potential  Fair    PT Frequency  2x / week    PT Duration  6 weeks    PT Treatment/Interventions  ADLs/Self Care Home Management;Cryotherapy;Electrical Stimulation;Moist Heat;Functional mobility training;Therapeutic activities;Therapeutic exercise;Balance training;Neuromuscular re-education;Patient/family education;Manual techniques;Passive range of motion;Dry needling;Energy conservation;Taping;Splinting    PT Next Visit Plan  continue manual for mobility, STM, pain control; perform scapular mobs in all directions (LUE > RUE); trial adding suboccipital release + gentle traction; continue [place] PROM/AAROM in supine; Continue to educate on posture and add stretches as appropriate.    PT Home Exercise Plan  eval: yellow putty; 1/2: supine cervical retractions, supine GH flexion AAROM with wand    Consulted and Agree with Plan of Care  Patient       Patient will benefit from skilled therapeutic intervention in order to improve the following deficits and impairments:  Decreased activity tolerance, Decreased range of motion, Decreased strength, Hypomobility, Increased muscle spasms, Impaired flexibility, Impaired UE functional use, Postural dysfunction, Obesity, Pain  Visit Diagnosis: Chronic left shoulder pain  Cervicalgia  Chronic right shoulder pain  Muscle weakness (generalized)  Other symptoms and signs involving the musculoskeletal  system     Problem List Patient Active Problem List   Diagnosis Date Noted  . OA (osteoarthritis) of hip 03/22/2016  . Chronic right hip pain 10/29/2015  . Chronic kidney disease (CKD) stage G3b/A1, moderately decreased glomerular filtration rate (GFR) between 30-44 mL/min/1.73 square meter and albuminuria creatinine ratio less than 30 mg/g (HCC) 06/03/2015  . Type 2 diabetes mellitus ([place]) 11/09/2014  . Diastolic dysfunction, left ventricle 05/16/2013  . Calcific aortic stenosis 05/16/2013  . Gout 05/12/2013  . Diabetic retinopathy ([place]) 12/17/2012  . Hypertension 06/10/2012  . Type II or unspecified type diabetes mellitus without mention of complication, uncontrolled 06/10/2012  . Hyperlipidemia 06/10/2012  . Hypothyroidism 06/10/2012      Geraldine Solar PT, [facility] [address] [place], [name], [address] Phone: [phone]   Fax:  [phone]  Name: ARIANIS BOWDITCH MRN: [id number] Date of Birth: [birth year]

## 2017-03-31 ENCOUNTER — Other Ambulatory Visit: Payer: Self-pay | Admitting: Family Medicine

## 2017-04-02 ENCOUNTER — Ambulatory Visit (HOSPITAL_COMMUNITY): Payer: Medicare Other | Admitting: Physical Therapy

## 2017-04-02 ENCOUNTER — Telehealth (HOSPITAL_COMMUNITY): Payer: Self-pay | Admitting: Family Medicine

## 2017-04-02 ENCOUNTER — Other Ambulatory Visit: Payer: Self-pay | Admitting: *Deleted

## 2017-04-02 MED ORDER — LISINOPRIL 20 MG PO TABS: 20.0000 mg | ORAL_TABLET | Freq: Every day | ORAL | 0 refills | Status: DC

## 2017-04-02 NOTE — Telephone Encounter (Signed)
04/02/17  pt left a message to cx because she still doesnt have power

## 2017-04-04 ENCOUNTER — Ambulatory Visit (HOSPITAL_COMMUNITY): Payer: Medicare Other

## 2017-04-04 ENCOUNTER — Encounter (HOSPITAL_COMMUNITY): Payer: Self-pay

## 2017-04-04 DIAGNOSIS — M542 Cervicalgia: Secondary | ICD-10-CM | POA: Diagnosis not present

## 2017-04-04 DIAGNOSIS — M6281 Muscle weakness (generalized): Secondary | ICD-10-CM | POA: Diagnosis not present

## 2017-04-04 DIAGNOSIS — M25511 Pain in right shoulder: Secondary | ICD-10-CM | POA: Diagnosis not present

## 2017-04-04 DIAGNOSIS — M25512 Pain in left shoulder: Principal | ICD-10-CM

## 2017-04-04 DIAGNOSIS — R29898 Other symptoms and signs involving the musculoskeletal system: Secondary | ICD-10-CM

## 2017-04-04 DIAGNOSIS — G8929 Other chronic pain: Secondary | ICD-10-CM

## 2017-04-04 NOTE — Therapy (Signed)
[place], [name], [address] Phone: [phone]   Fax:  [phone]  Physical Therapy Treatment  Patient Details  Name: Sabrina Deleon MRN: [id number] Date of Birth: [birth year] Referring Provider: Melina Schools, MD   Encounter Date: 04/04/2017  PT End of Session - 04/04/17 0814    Visit Number  8    Number of Visits  13    Date for PT Re-Evaluation  04/20/17    Authorization Type  [employer] Medicare    Authorization Time Period  03/09/17 to 04/20/17    Authorization - Visit Number  8    Authorization - Number of Visits  13    PT Start Time  0815    PT Stop Time  0902    PT Time Calculation (min)  47 min    Activity Tolerance  Patient tolerated treatment well;Patient limited by pain    Behavior During Therapy  [facility] for tasks assessed/performed       Past Medical History:  Diagnosis Date  . Aortic stenosis   . Arthritis   . Chronic kidney disease    Dr. Tami Ribas- noted increase kidney function levels- took pt off Metformin and started on Onglyza for Blood sugar control-pt being followed for this.  Marland Kitchen GERD (gastroesophageal reflux disease)   . Gout   . Hypertension   . Hypothyroidism   . Type 2 diabetes mellitus ([place])   . Wears glasses   . Wears partial dentures    Upper    Past Surgical History:  Procedure Laterality Date  . BACK SURGERY     Lumbar  . CARPAL TUNNEL RELEASE  11/02/2011   Procedure: CARPAL TUNNEL RELEASE;  Surgeon: Tennis Must, MD;  Location: [place];  Service: Orthopedics;  Laterality: Right;  . CARPAL TUNNEL RELEASE Left 01/14/2015   Procedure: LEFT CARPAL TUNNEL RELEASE;  Surgeon: Leanora Cover, MD;  Location: [place];  Service: Orthopedics;  Laterality: Left;  . CHOLECYSTECTOMY     laparoscopic  . COLONOSCOPY    . COLONOSCOPY N/A 11/12/2015   Procedure: COLONOSCOPY;  Surgeon: Rogene Houston, MD;  [facility];  Service: Endoscopy;   Laterality: N/A;  9:30  . ESOPHAGOGASTRODUODENOSCOPY N/A 11/12/2015   Procedure: ESOPHAGOGASTRODUODENOSCOPY (EGD);  Surgeon: Rogene Houston, MD;  [facility];  Service: Endoscopy;  Laterality: N/A;  . TOTAL HIP ARTHROPLASTY Left 03/22/2016   Procedure: LEFT TOTAL HIP ARTHROPLASTY ANTERIOR APPROACH;  Surgeon: Gaynelle Arabian, MD;  [facility];  Service: Orthopedics;  Laterality: Left;    There were no vitals filed for this visit.  Subjective Assessment - 04/04/17 0817    Subjective  Pt states she finally got power back yesterday evening. She states that when she was driving this morning, she could tell that her neck/shoulder didn't hurt as bad when she was turning her head. She can tell that therapy is starting to help.    Currently in Pain?  No/denies    Pain Onset  More than a month ago          [facility] Adult PT Treatment/Exercise - 04/04/17 0001      Shoulder Exercises: Supine   External Rotation  AAROM;Both;Limitations;10 reps    External Rotation Limitations  2# wand    Flexion  AAROM;Both;10 reps    Flexion Limitations  2# wand      Shoulder Exercises: Seated   Other Seated Exercises  3D throacic excursions x10 reps  each      Shoulder Exercises: Standing   Extension  Both;10 reps;Theraband    Theraband Level (Shoulder Extension)  Level 2 (Red)    Retraction  Both;10 reps;Theraband    Theraband Level (Shoulder Retraction)  Level 2 (Red)      Shoulder Exercises: Pulleys   Flexion  1 minute    ABduction  1 minute      Shoulder Exercises: ROM/Strengthening   UBE (Upper Arm Bike)  L3 x4 mins retro for postural strengthening      Shoulder Exercises: Stretch   Internal Rotation Stretch Limitations  10x10" with towel      Manual Therapy   Manual Therapy  Soft tissue mobilization    Manual therapy comments  completed seperately from all other skilled interventions    Soft tissue mobilization  L teres minor/infraspinatus insertion point; biceps long head  tendon/anterior deltoid           PT Education - 04/04/17 0818    Education provided  Yes    Education Details  exercise technique    Person(s) Educated  Patient    Methods  Explanation;Demonstration    Comprehension  Verbalized understanding;Returned demonstration       PT Short Term Goals - 03/30/17 0903      PT SHORT TERM GOAL #1   Title  Pt will be independent with HEP and perform consistently in order to maximize return to PLOF.    Baseline  1/11: performing them as much as she can but not every day    Time  3    Period  Weeks    Status  Partially Met      PT SHORT TERM GOAL #2   Title  Pt will have at least 5# increase in her gross grip strength of BUE to allow her to open bottles with greater ease.     Baseline  1/11: R: 23#, L: 25#    Time  3    Period  Weeks    Status  Achieved      PT SHORT TERM GOAL #3   Title  Pt will have improved LUE AROM by 15 deg or > throughout to decrease pain and maximize her ability to dress herself.     Baseline  1/11: L GH IR/ER improved as she had decreased trunk compensations    Time  3    Period  Weeks    Status  On-going        PT Long Term Goals - 03/30/17 5638      PT LONG TERM GOAL #1   Title  Pt will have improved LUE AROM to [facility] and with 3/10 shoulder pain or < to maximize pt's ability to get items in/out of cabinets [place] and to donn her bra with greater ease.     Time  6    Period  Weeks    Status  On-going      PT LONG TERM GOAL #2   Title  Pt will have improved cervical AROM by 15 deg or more throughout all ranges in order to decrease her pain maximize her ability to drive.     Baseline  1/11: cervical flexion improved by >15 deg, all others improved but was <15 deg    Time  6    Period  Weeks    Status  On-going      PT LONG TERM GOAL #3   Title  Pt will be able to demo proper sitting posture  with lumbar roll at least 75% of the time in order to allow her to sit for at least 1 hour to maximize her function at  work.     Baseline  1/11: states she feels she can sit for 1 hour at work with proper posture and then has to get up    Time  6    Period  Weeks    Status  Achieved      PT LONG TERM GOAL #4   Title  Pt's MMT will be at least 4+/5 throughout all muscle groups tested and her gross grip strength will improve to at least 25# bilaterally in order to decrease her pain and allow her to cook and perform household chores with greater ease.     Baseline  1/11: has made some improvements     Time  6    Period  Weeks    Status  On-going      PT LONG TERM GOAL #5   Title  Pt will score 14 or < on the NDI in order to demo improved overall perceived disability from her neck pain.    Time  6    Period  Weeks    Status  New            Plan - 04/04/17 0904    Clinical Impression Statement  Continued with established POC, focusing on improving L GH mobility, pain, soft tissue restricitons, and posture. Pt continuing to steadily improve with each session. She did have difficulty with AAROM with wand for abd this date due to increased pain. Added thoracic mobility work and postural strengthening this date with no issues. Ended with manual to L GH musculature and she continues with tenderness at teres minor/infraspinatus insertion and long head biceps tendon. Continue as planned. Considering adding cervical traction to pt's POC so resent cert again this date as traction was not in initial POC.     Rehab Potential  Fair    PT Frequency  2x / week    PT Duration  6 weeks    PT Treatment/Interventions  ADLs/Self Care Home Management;Cryotherapy;Electrical Stimulation;Moist Heat;Functional mobility training;Therapeutic activities;Therapeutic exercise;Balance training;Neuromuscular re-education;Patient/family education;Manual techniques;Passive range of motion;Dry needling;Energy conservation;Taping;Splinting;Traction    PT Next Visit Plan  give pink putty; continue IR stretch with towel; continue manual for  mobility, STM, pain control; perform scapular mobs in all directions (LUE > RUE); continue suboccipital release + gentle traction; continue [place] PROM/AAROM in supine; Continue to educate on posture and add stretches as appropriate.    PT Home Exercise Plan  eval: yellow putty; 1/2: supine cervical retractions, supine GH flexion AAROM with wand    Consulted and Agree with Plan of Care  Patient       Patient will benefit from skilled therapeutic intervention in order to improve the following deficits and impairments:  Decreased activity tolerance, Decreased range of motion, Decreased strength, Hypomobility, Increased muscle spasms, Impaired flexibility, Impaired UE functional use, Postural dysfunction, Obesity, Pain  Visit Diagnosis: Chronic left shoulder pain - Plan: PT plan of care cert/re-cert  Cervicalgia - Plan: PT plan of care cert/re-cert  Chronic right shoulder pain - Plan: PT plan of care cert/re-cert  Muscle weakness (generalized) - Plan: PT plan of care cert/re-cert  Other symptoms and signs involving the musculoskeletal system - Plan: PT plan of care cert/re-cert     Problem List Patient Active Problem List   Diagnosis Date Noted  . OA (osteoarthritis) of hip 03/22/2016  . Chronic  right hip pain 10/29/2015  . Chronic kidney disease (CKD) stage G3b/A1, moderately decreased glomerular filtration rate (GFR) between 30-44 mL/min/1.73 square meter and albuminuria creatinine ratio less than 30 mg/g (HCC) 06/03/2015  . Type 2 diabetes mellitus ([place]) 11/09/2014  . Diastolic dysfunction, left ventricle 05/16/2013  . Calcific aortic stenosis 05/16/2013  . Gout 05/12/2013  . Diabetic retinopathy ([place]) 12/17/2012  . Hypertension 06/10/2012  . Type II or unspecified type diabetes mellitus without mention of complication, uncontrolled 06/10/2012  . Hyperlipidemia 06/10/2012  . Hypothyroidism 06/10/2012       Geraldine Solar PT, [place] [address] [place], [name], [address] Phone: [phone]   Fax:  [phone]  Name: BRAILEIGH LANDENBERGER MRN: [id number] Date of Birth: 1950-07-24

## 2017-04-05 ENCOUNTER — Telehealth: Payer: Self-pay | Admitting: *Deleted

## 2017-04-05 NOTE — Telephone Encounter (Signed)
Requesting refill on hydrocodone. Has appt when feb 8th.  [facility].

## 2017-04-06 ENCOUNTER — Other Ambulatory Visit: Payer: Self-pay | Admitting: *Deleted

## 2017-04-06 MED ORDER — HYDROCODONE-ACETAMINOPHEN 5-325 MG PO TABS: 1.0000 | ORAL_TABLET | Freq: Three times a day (TID) | ORAL | 0 refills | Status: DC | PRN

## 2017-04-06 NOTE — Telephone Encounter (Signed)
May refill hydrocodone- drug registry was checked

## 2017-04-06 NOTE — Telephone Encounter (Signed)
Script ready for pickup. Pt was notified.

## 2017-04-10 ENCOUNTER — Ambulatory Visit (HOSPITAL_COMMUNITY): Payer: Medicare Other | Admitting: Physical Therapy

## 2017-04-10 DIAGNOSIS — M6281 Muscle weakness (generalized): Secondary | ICD-10-CM

## 2017-04-10 DIAGNOSIS — R29898 Other symptoms and signs involving the musculoskeletal system: Secondary | ICD-10-CM

## 2017-04-10 DIAGNOSIS — M25512 Pain in left shoulder: Secondary | ICD-10-CM | POA: Diagnosis not present

## 2017-04-10 DIAGNOSIS — G8929 Other chronic pain: Secondary | ICD-10-CM | POA: Diagnosis not present

## 2017-04-10 DIAGNOSIS — M25511 Pain in right shoulder: Secondary | ICD-10-CM | POA: Diagnosis not present

## 2017-04-10 DIAGNOSIS — M542 Cervicalgia: Secondary | ICD-10-CM | POA: Diagnosis not present

## 2017-04-10 NOTE — Therapy (Signed)
[place], [name], [address] Phone: [phone]   Fax:  [phone]  Physical Therapy Treatment  Patient Details  Name: Sabrina Deleon MRN: [id number] Date of Birth: 11/07/1950 Referring Provider: Melina Schools, MD   Encounter Date: 04/10/2017  PT End of Session - 04/10/17 1746    Visit Number  9    Number of Visits  13    Date for PT Re-Evaluation  04/20/17    Authorization Type  [employer] Medicare    Authorization Time Period  03/09/17 to 04/20/17    Authorization - Visit Number  9    Authorization - Number of Visits  13    PT Start Time  1975    PT Stop Time  1742    PT Time Calculation (min)  50 min    Activity Tolerance  Patient tolerated treatment well;Patient limited by pain    Behavior During Therapy  [facility] for tasks assessed/performed       Past Medical History:  Diagnosis Date  . Aortic stenosis   . Arthritis   . Chronic kidney disease    Dr. Tami Deleon- noted increase kidney function levels- took pt off Metformin and started on Onglyza for Blood sugar control-pt being followed for this.  Sabrina Deleon GERD (gastroesophageal reflux disease)   . Gout   . Hypertension   . Hypothyroidism   . Type 2 diabetes mellitus ([place])   . Wears glasses   . Wears partial dentures    Upper    Past Surgical History:  Procedure Laterality Date  . BACK SURGERY     Lumbar  . CARPAL TUNNEL RELEASE  11/02/2011   Procedure: CARPAL TUNNEL RELEASE;  Surgeon: Tennis Must, MD;  Location: [place];  Service: Orthopedics;  Laterality: Right;  . CARPAL TUNNEL RELEASE Left 01/14/2015   Procedure: LEFT CARPAL TUNNEL RELEASE;  Surgeon: Leanora Cover, MD;  Location: [place];  Service: Orthopedics;  Laterality: Left;  . CHOLECYSTECTOMY     laparoscopic  . COLONOSCOPY    . COLONOSCOPY N/A 11/12/2015   Procedure: COLONOSCOPY;  Surgeon: Sabrina Houston, MD;  [facility];  Service: Endoscopy;   Laterality: N/A;  9:30  . ESOPHAGOGASTRODUODENOSCOPY N/A 11/12/2015   Procedure: ESOPHAGOGASTRODUODENOSCOPY (EGD);  Surgeon: Sabrina Houston, MD;  [facility];  Service: Endoscopy;  Laterality: N/A;  . TOTAL HIP ARTHROPLASTY Left 03/22/2016   Procedure: LEFT TOTAL HIP ARTHROPLASTY ANTERIOR APPROACH;  Surgeon: Sabrina Arabian, MD;  [facility];  Service: Orthopedics;  Laterality: Left;    There were no vitals filed for this visit.  Subjective Assessment - 04/10/17 1708    Subjective  Pt states she is good today but could barely get around yesterday due to her back.  States currently her Lt shoulder is hurting 2/10 when moved but at rest it doesn't hurt.    Currently in Pain?  No/denies                      [facility] Adult PT Treatment/Exercise - 04/10/17 0001      Shoulder Exercises: Supine   External Rotation  AAROM;Both;Limitations;10 reps    External Rotation Limitations  2# wand    Flexion  AAROM;Both;10 reps    Flexion Limitations  2# wand      Shoulder Exercises: Seated   Other Seated Exercises  3D throacic excursions x10 reps each      Shoulder Exercises: Standing  Extension  Both;10 reps;Theraband    Theraband Level (Shoulder Extension)  Level 2 (Red)    Retraction  Both;10 reps;Theraband    Theraband Level (Shoulder Retraction)  Level 2 (Red)      Shoulder Exercises: Pulleys   Flexion  1 minute    ABduction  1 minute      Shoulder Exercises: ROM/Strengthening   UBE (Upper Arm Bike)  L3 x4 mins retro for postural strengthening      Manual Therapy   Manual Therapy  Soft tissue mobilization    Manual therapy comments  completed seperately from all other skilled interventions    Soft tissue mobilization  L teres minor/infraspinatus insertion point; biceps long head tendon/anterior deltoid    Myofascial Release  To promote relaxation, decrease pain, and improve functional mobility as a result               PT Short Term Goals - 03/30/17  0903      PT SHORT TERM GOAL #1   Title  Pt will be independent with HEP and perform consistently in order to maximize return to PLOF.    Baseline  1/11: performing them as much as she can but not every day    Time  3    Period  Weeks    Status  Partially Met      PT SHORT TERM GOAL #2   Title  Pt will have at least 5# increase in her gross grip strength of BUE to allow her to open bottles with greater ease.     Baseline  1/11: R: 23#, L: 25#    Time  3    Period  Weeks    Status  Achieved      PT SHORT TERM GOAL #3   Title  Pt will have improved LUE AROM by 15 deg or > throughout to decrease pain and maximize her ability to dress herself.     Baseline  1/11: L GH IR/ER improved as she had decreased trunk compensations    Time  3    Period  Weeks    Status  On-going        PT Long Term Goals - 03/30/17 4097      PT LONG TERM GOAL #1   Title  Pt will have improved LUE AROM to [facility] and with 3/10 shoulder pain or < to maximize pt's ability to get items in/out of cabinets [place] and to donn her bra with greater ease.     Time  6    Period  Weeks    Status  On-going      PT LONG TERM GOAL #2   Title  Pt will have improved cervical AROM by 15 deg or more throughout all ranges in order to decrease her pain maximize her ability to drive.     Baseline  1/11: cervical flexion improved by >15 deg, all others improved but was <15 deg    Time  6    Period  Weeks    Status  On-going      PT LONG TERM GOAL #3   Title  Pt will be able to demo proper sitting posture with lumbar roll at least 75% of the time in order to allow her to sit for at least 1 hour to maximize her function at work.     Baseline  1/11: states she feels she can sit for 1 hour at work with proper posture and then has to get up  Time  6    Period  Weeks    Status  Achieved      PT LONG TERM GOAL #4   Title  Pt's MMT will be at least 4+/5 throughout all muscle groups tested and her gross grip strength will improve to  at least 25# bilaterally in order to decrease her pain and allow her to cook and perform household chores with greater ease.     Baseline  1/11: has made some improvements     Time  6    Period  Weeks    Status  On-going      PT LONG TERM GOAL #5   Title  Pt will score 14 or < on the NDI in order to demo improved overall perceived disability from her neck pain.    Time  6    Period  Weeks    Status  New            Plan - 04/10/17 1747    Clinical Impression Statement  Continued with focus on improving LT shoulder ROM through exercise and manual technqiues.  Pt with  "bad day" today, very tight, restictive motion with pain reported.  Myofascial completed at infraspinatus insertion with resultant reduction in pain.  ER remains most restricted.      Rehab Potential  Fair    PT Frequency  2x / week    PT Duration  6 weeks    PT Treatment/Interventions  ADLs/Self Care Home Management;Cryotherapy;Electrical Stimulation;Moist Heat;Functional mobility training;Therapeutic activities;Therapeutic exercise;Balance training;Neuromuscular re-education;Patient/family education;Manual techniques;Passive range of motion;Dry needling;Energy conservation;Taping;Splinting;Traction    PT Next Visit Plan  Continue with appropriate stretches and manual to elicit improved mobility.   Consider trial of cervical traction per last PT POC.      PT Home Exercise Plan  eval: yellow putty; 1/2: supine cervical retractions, supine GH flexion AAROM with wand  1/22:  updated with pink putty    Consulted and Agree with Plan of Care  Patient       Patient will benefit from skilled therapeutic intervention in order to improve the following deficits and impairments:  Decreased activity tolerance, Decreased range of motion, Decreased strength, Hypomobility, Increased muscle spasms, Impaired flexibility, Impaired UE functional use, Postural dysfunction, Obesity, Pain  Visit Diagnosis: Chronic left shoulder  pain  Cervicalgia  Chronic right shoulder pain  Muscle weakness (generalized)  Other symptoms and signs involving the musculoskeletal system     Problem List Patient Active Problem List   Diagnosis Date Noted  . OA (osteoarthritis) of hip 03/22/2016  . Chronic right hip pain 10/29/2015  . Chronic kidney disease (CKD) stage G3b/A1, moderately decreased glomerular filtration rate (GFR) between 30-44 mL/min/1.73 square meter and albuminuria creatinine ratio less than 30 mg/g (HCC) 06/03/2015  . Type 2 diabetes mellitus ([place]) 11/09/2014  . Diastolic dysfunction, left ventricle 05/16/2013  . Calcific aortic stenosis 05/16/2013  . Gout 05/12/2013  . Diabetic retinopathy ([place]) 12/17/2012  . Hypertension 06/10/2012  . Type II or unspecified type diabetes mellitus without mention of complication, uncontrolled 06/10/2012  . Hyperlipidemia 06/10/2012  . Hypothyroidism 06/10/2012   Teena Irani, PTA/CLT [phone]  Teena Irani 04/10/2017, 5:52 PM  [facility] [address] [place], [name], [address] Phone: [phone]   Fax:  [phone]  Name: ARSHI DUARTE MRN: [id number] Date of Birth: Mar 01, 1951

## 2017-04-12 ENCOUNTER — Ambulatory Visit (HOSPITAL_COMMUNITY): Payer: Medicare Other | Admitting: Physical Therapy

## 2017-04-12 DIAGNOSIS — M25512 Pain in left shoulder: Secondary | ICD-10-CM | POA: Diagnosis not present

## 2017-04-12 DIAGNOSIS — R29898 Other symptoms and signs involving the musculoskeletal system: Secondary | ICD-10-CM | POA: Diagnosis not present

## 2017-04-12 DIAGNOSIS — M6281 Muscle weakness (generalized): Secondary | ICD-10-CM | POA: Diagnosis not present

## 2017-04-12 DIAGNOSIS — M542 Cervicalgia: Secondary | ICD-10-CM

## 2017-04-12 DIAGNOSIS — G8929 Other chronic pain: Secondary | ICD-10-CM | POA: Diagnosis not present

## 2017-04-12 DIAGNOSIS — M25511 Pain in right shoulder: Secondary | ICD-10-CM | POA: Diagnosis not present

## 2017-04-12 NOTE — Therapy (Addendum)
Sabrina Dele[place], [name], [address] Phone: [phone]   Fax:  [phone]  Physical Therapy Treatment  Patient Details  Name: Sabrina Deleon MRN: [id number] Date of Birth: [birth year] Referring Provider: Melina Schools, MD   Encounter Date: 04/12/2017  PT End of Session - 04/12/17 0937    Visit Number  10    Number of Visits  13    Date for PT Re-Evaluation  04/20/17    Authorization Type  [employer] Medicare    Authorization Time Period  03/09/17 to 04/20/17    Authorization - Visit Number  10    Authorization - Number of Visits  13    PT Start Time  0905    PT Stop Time  0945    PT Time Calculation (min)  40 min    Activity Tolerance  Patient tolerated treatment well;Patient limited by pain    Behavior During Therapy  [facility] for tasks assessed/performed       Past Medical History:  Diagnosis Date  . Aortic stenosis   . Arthritis   . Chronic kidney disease    Dr. Tami Ribas- noted increase kidney function levels- took pt off Metformin and started on Onglyza for Blood sugar control-pt being followed for this.  Marland Kitchen GERD (gastroesophageal reflux disease)   . Gout   . Hypertension   . Hypothyroidism   . Type 2 diabetes mellitus ([place])   . Wears glasses   . Wears partial dentures    Upper    Past Surgical History:  Procedure Laterality Date  . BACK SURGERY     Lumbar  . CARPAL TUNNEL RELEASE  11/02/2011   Procedure: CARPAL TUNNEL RELEASE;  Surgeon: Tennis Must, MD;  Location: [facility];  Service: Orthopedics;  Laterality: Right;  . CARPAL TUNNEL RELEASE Left 01/14/2015   Procedure: LEFT CARPAL TUNNEL RELEASE;  Surgeon: Leanora Cover, MD;  Location: [facility];  Service: Orthopedics;  Laterality: Left;  . CHOLECYSTECTOMY     laparoscopic  . COLONOSCOPY    . COLONOSCOPY N/A 11/12/2015   Procedure: COLONOSCOPY;  Surgeon: Rogene Houston, MD;  [facility];  Service: Endoscopy;   Laterality: N/A;  9:30  . ESOPHAGOGASTRODUODENOSCOPY N/A 11/12/2015   Procedure: ESOPHAGOGASTRODUODENOSCOPY (EGD);  Surgeon: Rogene Houston, MD;  [facility];  Service: Endoscopy;  Laterality: N/A;  . TOTAL HIP ARTHROPLASTY Left 03/22/2016   Procedure: LEFT TOTAL HIP ARTHROPLASTY ANTERIOR APPROACH;  Surgeon: Gaynelle Arabian, MD;  [facility];  Service: Orthopedics;  Laterality: Left;    There were no vitals filed for this visit.  Subjective Assessment - 04/12/17 1157    Subjective  PT states she is having a good day.  Much better than last session. STates her pain conintues to limit her ROM in her Lt shoulder.  pain 0-2/10    Currently in Pain?  Yes    Pain Score  2     Pain Location  Shoulder    Pain Orientation  Left    Aggravating Factors   movement into end ROM         [facility] PT Assessment - 04/12/17 0001      Assessment   Medical Diagnosis  L shoulder pain, Degenerative intervertebral disc at C5-6 and C6-7; facet arthritis of cervical region      AROM   Left Shoulder Flexion  100 Degrees    Left Shoulder ABduction  65 Degrees  Left Shoulder Internal Rotation  85 Degrees    Left Shoulder External Rotation  20 Degrees                  [facility] Adult PT Treatment/Exercise - 04/12/17 0001      Shoulder Exercises: Supine   External Rotation  AAROM;Both;Limitations;10 reps    External Rotation Limitations  2# wand    Flexion  AAROM;Both;10 reps    Flexion Limitations  2# wand      Shoulder Exercises: Pulleys   Flexion  1 minute    ABduction  1 minute      Shoulder Exercises: ROM/Strengthening   UBE (Upper Arm Bike)  L3 x4 mins retro for postural strengthening      Shoulder Exercises: Stretch   External Rotation Stretch  5 reps;10 seconds doorway stretch    Other Shoulder Stretches  facing wall, sliding up into flexion 5 reps 10" holds      Manual Therapy   Manual Therapy  Soft tissue mobilization    Manual therapy comments  completed  seperately from all other skilled interventions    Soft tissue mobilization  L teres minor/infraspinatus insertion point; biceps long head tendon/anterior deltoid    Myofascial Release  To promote relaxation, decrease pain, and improve functional mobility as a result    Passive ROM  to Lt shoulder in all directions               PT Short Term Goals - 03/30/17 0903      PT SHORT TERM GOAL #1   Title  Pt will be independent with HEP and perform consistently in order to maximize return to PLOF.    Baseline  1/11: performing them as much as she can but not every day    Time  3    Period  Weeks    Status  Partially Met      PT SHORT TERM GOAL #2   Title  Pt will have at least 5# increase in her gross grip strength of BUE to allow her to open bottles with greater ease.     Baseline  1/11: R: 23#, L: 25#    Time  3    Period  Weeks    Status  Achieved      PT SHORT TERM GOAL #3   Title  Pt will have improved LUE AROM by 15 deg or > throughout to decrease pain and maximize her ability to dress herself.     Baseline  1/11: L GH IR/ER improved as she had decreased trunk compensations    Time  3    Period  Weeks    Status  On-going        PT Long Term Goals - 03/30/17 5732      PT LONG TERM GOAL #1   Title  Pt will have improved LUE AROM to [facility] and with 3/10 shoulder pain or < to maximize pt's ability to get items in/out of cabinets [place] and to donn her bra with greater ease.     Time  6    Period  Weeks    Status  On-going      PT LONG TERM GOAL #2   Title  Pt will have improved cervical AROM by 15 deg or more throughout all ranges in order to decrease her pain maximize her ability to drive.     Baseline  1/11: cervical flexion improved by >15 deg, all others improved but was <15 deg  Time  6    Period  Weeks    Status  On-going      PT LONG TERM GOAL #3   Title  Pt will be able to demo proper sitting posture with lumbar roll at least 75% of the time in order to allow  her to sit for at least 1 hour to maximize her function at work.     Baseline  1/11: states she feels she can sit for 1 hour at work with proper posture and then has to get up    Time  6    Period  Weeks    Status  Achieved      PT LONG TERM GOAL #4   Title  Pt's MMT will be at least 4+/5 throughout all muscle groups tested and her gross grip strength will improve to at least 25# bilaterally in order to decrease her pain and allow her to cook and perform household chores with greater ease.     Baseline  1/11: has made some improvements     Time  6    Period  Weeks    Status  On-going      PT LONG TERM GOAL #5   Title  Pt will score 14 or < on the NDI in order to demo improved overall perceived disability from her neck pain.    Time  6    Period  Weeks    Status  New            Plan - 04/12/17 1200    Clinical Impression Statement  Less discomfort today, howeer still limited ROM due to pain.  measured ROM thsi session in supine with AAROM. All motions more limited than measured last with ER most limited at 20 degrees.  Contiues wtih tightnessl/restictions in infraspinatus insertion region as well as deltoid.      Rehab Potential  Fair    PT Frequency  2x / week    PT Duration  6 weeks    PT Treatment/Interventions  ADLs/Self Care Home Management;Cryotherapy;Electrical Stimulation;Moist Heat;Functional mobility training;Therapeutic activities;Therapeutic exercise;Balance training;Neuromuscular re-education;Patient/family education;Manual techniques;Passive range of motion;Dry needling;Energy conservation;Taping;Splinting;Traction    PT Next Visit Plan  Continue with appropriate stretches and manual to elicit improved mobility.   To discuss decreasing ROM with evaluating therapist. Updated: spoke with brooke and trial of cervical traction next session per MRI results.   PT Home Exercise Plan  eval: yellow putty; 1/2: supine cervical retractions, supine GH flexion AAROM with wand  1/22:   updated with pink putty    Consulted and Agree with Plan of Care  Patient       Patient will benefit from skilled therapeutic intervention in order to improve the following deficits and impairments:  Decreased activity tolerance, Decreased range of motion, Decreased strength, Hypomobility, Increased muscle spasms, Impaired flexibility, Impaired UE functional use, Postural dysfunction, Obesity, Pain  Visit Diagnosis: Chronic left shoulder pain  Cervicalgia     Problem List Patient Active Problem List   Diagnosis Date Noted  . OA (osteoarthritis) of hip 03/22/2016  . Chronic right hip pain 10/29/2015  . Chronic kidney disease (CKD) stage G3b/A1, moderately decreased glomerular filtration rate (GFR) between 30-44 mL/min/1.73 square meter and albuminuria creatinine ratio less than 30 mg/g (HCC) 06/03/2015  . Type 2 diabetes mellitus ([place]) 11/09/2014  . Diastolic dysfunction, left ventricle 05/16/2013  . Calcific aortic stenosis 05/16/2013  . Gout 05/12/2013  . Diabetic retinopathy ([place]) 12/17/2012  . Hypertension 06/10/2012  . Type  II or unspecified type diabetes mellitus without mention of complication, uncontrolled 06/10/2012  . Hyperlipidemia 06/10/2012  . Hypothyroidism 06/10/2012    Teena Irani 04/12/2017, 12:03 PM  [place], [name], [address] Phone: [phone]   Fax:  [phone]  Name: TALIAH PORCHE MRN: [id number] Date of Birth: April 03, 1950

## 2017-04-18 ENCOUNTER — Ambulatory Visit (HOSPITAL_COMMUNITY): Payer: Medicare Other

## 2017-04-18 ENCOUNTER — Encounter (HOSPITAL_COMMUNITY): Payer: Self-pay

## 2017-04-18 DIAGNOSIS — M25511 Pain in right shoulder: Secondary | ICD-10-CM

## 2017-04-18 DIAGNOSIS — M542 Cervicalgia: Secondary | ICD-10-CM

## 2017-04-18 DIAGNOSIS — M25512 Pain in left shoulder: Secondary | ICD-10-CM | POA: Diagnosis not present

## 2017-04-18 DIAGNOSIS — M6281 Muscle weakness (generalized): Secondary | ICD-10-CM | POA: Diagnosis not present

## 2017-04-18 DIAGNOSIS — G8929 Other chronic pain: Secondary | ICD-10-CM

## 2017-04-18 DIAGNOSIS — R29898 Other symptoms and signs involving the musculoskeletal system: Secondary | ICD-10-CM | POA: Diagnosis not present

## 2017-04-18 NOTE — Therapy (Signed)
[place], [name], [address] Phone: [phone]   Fax:  [phone]  Physical Therapy Treatment  Patient Details  Name: Sabrina Deleon MRN: [id number] Date of Birth: Dec 12, 1950 Referring Provider: Melina Schools, MD   Encounter Date: 04/18/2017  PT End of Session - 04/18/17 1704    Visit Number  11    Number of Visits  13    Date for PT Re-Evaluation  04/20/17    Authorization Type  [employer] Medicare    Authorization Time Period  03/09/17 to 04/20/17    Authorization - Visit Number  11    Authorization - Number of Visits  13    PT Start Time  4742    PT Stop Time  1740    PT Time Calculation (min)  48 min    Activity Tolerance  Patient tolerated treatment well;Patient limited by pain;No increased pain    Behavior During Therapy  W[facility] for tasks assessed/performed       Past Medical History:  Diagnosis Date  . Aortic stenosis   . Arthritis   . Chronic kidney disease    Dr. Tami Ribas- noted increase kidney function levels- took pt off Metformin and started on Onglyza for Blood sugar control-pt being followed for this.  Marland Kitchen GERD (gastroesophageal reflux disease)   . Gout   . Hypertension   . Hypothyroidism   . Type 2 diabetes mellitus ([place])   . Wears glasses   . Wears partial dentures    Upper    Past Surgical History:  Procedure Laterality Date  . BACK SURGERY     Lumbar  . CARPAL TUNNEL RELEASE  11/02/2011   Procedure: CARPAL TUNNEL RELEASE;  Surgeon: Tennis Must, MD;  Location: [facility];  Service: Orthopedics;  Laterality: Right;  . CARPAL TUNNEL RELEASE Left 01/14/2015   Procedure: LEFT CARPAL TUNNEL RELEASE;  Surgeon: Leanora Cover, MD;  Location: [facility];  Service: Orthopedics;  Laterality: Left;  . CHOLECYSTECTOMY     laparoscopic  . COLONOSCOPY    . COLONOSCOPY N/A 11/12/2015   Procedure: COLONOSCOPY;  Surgeon: Rogene Houston, MD;  [facility];  Service:  Endoscopy;  Laterality: N/A;  9:30  . ESOPHAGOGASTRODUODENOSCOPY N/A 11/12/2015   Procedure: ESOPHAGOGASTRODUODENOSCOPY (EGD);  Surgeon: Rogene Houston, MD;  [facility];  Service: Endoscopy;  Laterality: N/A;  . TOTAL HIP ARTHROPLASTY Left 03/22/2016   Procedure: LEFT TOTAL HIP ARTHROPLASTY ANTERIOR APPROACH;  Surgeon: Gaynelle Arabian, MD;  [facility];  Service: Orthopedics;  Laterality: Left;    There were no vitals filed for this visit.  Subjective Assessment - 04/18/17 1657    Subjective  Pt stated she has had a good functional day.  Reports she was able to go 2 days this week without requiring pain medication, did have to take pain medication at 3:30 today.  Currently pain free but does have more pain in arm with movements    Patient Stated Goals  no pain, get back to what I'm supposed to do    Currently in Pain?  No/denies                      [facility] Adult PT Treatment/Exercise - 04/18/17 0001      Shoulder Exercises: Supine   External Rotation  AAROM;Both;Limitations;10 reps    External Rotation Limitations  wand    Flexion  AAROM;Both;10 reps    Flexion Limitations  wand and inferior GH joint mobs    ABduction  AAROM;Both;10 reps    ABduction Limitations  wand with inferior GH joint mobs      Shoulder Exercises: Sidelying   External Rotation  10 reps      Shoulder Exercises: Pulleys   Flexion  2 minutes standing with pillow behind body to improve form      Shoulder Exercises: ROM/Strengthening   UBE (Upper Arm Bike)  L3 x4 mins retro for postural strengthening      Shoulder Exercises: Stretch   External Rotation Stretch  5 reps;10 seconds    Other Shoulder Stretches  facing wall, sliding up into flexion 5 reps 10" holds    Other Shoulder Stretches  reverse wall pushup for anterior capsule stretch 5x 10"      Manual Therapy   Manual Therapy  Joint mobilization;Soft tissue mobilization    Manual therapy comments  completed seperately from all  other skilled interventions    Joint Mobilization  anterior, inferior and posterior GH joint mobs    Soft tissue mobilization  Pec minor, biceps long head and deltoid anterior and medial    Passive ROM  to Lt shoulder in all directions    Manual Traction  L GH offloading during PROM               PT Short Term Goals - 03/30/17 0903      PT SHORT TERM GOAL #1   Title  Pt will be independent with HEP and perform consistently in order to maximize return to PLOF.    Baseline  1/11: performing them as much as she can but not every day    Time  3    Period  Weeks    Status  Partially Met      PT SHORT TERM GOAL #2   Title  Pt will have at least 5# increase in her gross grip strength of BUE to allow her to open bottles with greater ease.     Baseline  1/11: R: 23#, L: 25#    Time  3    Period  Weeks    Status  Achieved      PT SHORT TERM GOAL #3   Title  Pt will have improved LUE AROM by 15 deg or > throughout to decrease pain and maximize her ability to dress herself.     Baseline  1/11: L GH IR/ER improved as she had decreased trunk compensations    Time  3    Period  Weeks    Status  On-going        PT Long Term Goals - 03/30/17 6295      PT LONG TERM GOAL #1   Title  Pt will have improved LUE AROM to [facility] and with 3/10 shoulder pain or < to maximize pt's ability to get items in/out of cabinets [place] and to donn her bra with greater ease.     Time  6    Period  Weeks    Status  On-going      PT LONG TERM GOAL #2   Title  Pt will have improved cervical AROM by 15 deg or more throughout all ranges in order to decrease her pain maximize her ability to drive.     Baseline  1/11: cervical flexion improved by >15 deg, all others improved but was <15 deg    Time  6    Period  Weeks    Status  On-going  PT LONG TERM GOAL #3   Title  Pt will be able to demo proper sitting posture with lumbar roll at least 75% of the time in order to allow her to sit for at least 1 hour to  maximize her function at work.     Baseline  1/11: states she feels she can sit for 1 hour at work with proper posture and then has to get up    Time  6    Period  Weeks    Status  Achieved      PT LONG TERM GOAL #4   Title  Pt's MMT will be at least 4+/5 throughout all muscle groups tested and her gross grip strength will improve to at least 25# bilaterally in order to decrease her pain and allow her to cook and perform household chores with greater ease.     Baseline  1/11: has made some improvements     Time  6    Period  Weeks    Status  On-going      PT LONG TERM GOAL #5   Title  Pt will score 14 or < on the NDI in order to demo improved overall perceived disability from her neck pain.    Time  6    Period  Weeks    Status  New            Plan - 04/18/17 1747    Clinical Impression Statement  Added inferior GH joint mobs with noted improved ROM with flexion and abduction and reports of improved tolerance with movement.  Pt tolerated well towards session with minimal reoprts of discomfort at end range especially with ER.  Continues to have limited mobility all directions.      Rehab Potential  Fair    PT Frequency  2x / week    PT Duration  6 weeks    PT Treatment/Interventions  ADLs/Self Care Home Management;Cryotherapy;Electrical Stimulation;Moist Heat;Functional mobility training;Therapeutic activities;Therapeutic exercise;Balance training;Neuromuscular re-education;Patient/family education;Manual techniques;Passive range of motion;Dry needling;Energy conservation;Taping;Splinting;Traction    PT Next Visit Plan  Reassess next session.  Continue with appropriate stretches and manual to elicit improved mobility.   To discuss decreasing ROM with evaluating therapist.    PT Home Exercise Plan  eval: yellow putty; 1/2: supine cervical retractions, supine GH flexion AAROM with wand  1/22:  updated with pink putty       Patient will benefit from skilled therapeutic intervention  in order to improve the following deficits and impairments:  Decreased activity tolerance, Decreased range of motion, Decreased strength, Hypomobility, Increased muscle spasms, Impaired flexibility, Impaired UE functional use, Postural dysfunction, Obesity, Pain  Visit Diagnosis: Chronic left shoulder pain  Cervicalgia  Chronic right shoulder pain  Muscle weakness (generalized)  Other symptoms and signs involving the musculoskeletal system     Problem List Patient Active Problem List   Diagnosis Date Noted  . OA (osteoarthritis) of hip 03/22/2016  . Chronic right hip pain 10/29/2015  . Chronic kidney disease (CKD) stage G3b/A1, moderately decreased glomerular filtration rate (GFR) between 30-44 mL/min/1.73 square meter and albuminuria creatinine ratio less than 30 mg/g (HCC) 06/03/2015  . Type 2 diabetes mellitus ([place]) 11/09/2014  . Diastolic dysfunction, left ventricle 05/16/2013  . Calcific aortic stenosis 05/16/2013  . Gout 05/12/2013  . Diabetic retinopathy ([place]) 12/17/2012  . Hypertension 06/10/2012  . Type II or unspecified type diabetes mellitus without mention of complication, uncontrolled 06/10/2012  . Hyperlipidemia 06/10/2012  . Hypothyroidism 06/10/2012   Myriam Jacobson  [place]; CBIS [phone]  Aldona Lento 04/18/2017, 5:50 PM  [place], [name], [address] Phone: [phone]   Fax:  [phone]  Name: ANNESSA SATRE MRN: [id number] Date of Birth: 29-Mar-1950

## 2017-04-20 ENCOUNTER — Ambulatory Visit (HOSPITAL_COMMUNITY): Payer: Medicare Other | Attending: Orthopedic Surgery

## 2017-04-20 ENCOUNTER — Encounter (HOSPITAL_COMMUNITY): Payer: Self-pay

## 2017-04-20 DIAGNOSIS — M6281 Muscle weakness (generalized): Secondary | ICD-10-CM | POA: Diagnosis not present

## 2017-04-20 DIAGNOSIS — M25512 Pain in left shoulder: Secondary | ICD-10-CM | POA: Diagnosis not present

## 2017-04-20 DIAGNOSIS — R29898 Other symptoms and signs involving the musculoskeletal system: Secondary | ICD-10-CM | POA: Insufficient documentation

## 2017-04-20 DIAGNOSIS — M542 Cervicalgia: Secondary | ICD-10-CM

## 2017-04-20 DIAGNOSIS — M25511 Pain in right shoulder: Secondary | ICD-10-CM | POA: Insufficient documentation

## 2017-04-20 DIAGNOSIS — G8929 Other chronic pain: Secondary | ICD-10-CM | POA: Insufficient documentation

## 2017-04-20 NOTE — Therapy (Addendum)
[place], [name], [address] Phone: [phone]   Fax:  [phone]   Addendum on 05/08/17: Pt called and requested to be d/c and appreciates all that we did for her, she is doing better but still needs to return to MD to get more information on this issue.  PHYSICAL THERAPY DISCHARGE SUMMARY  Visits from Start of Care: 12  Current functional level related to goals / functional outcomes: See below   Remaining deficits: See below   Education / Equipment: See below Plan: Patient agrees to discharge.  Patient goals were partially met. Patient is being discharged due to the patient's request.  ?????     Geraldine Solar PT, DPT   Physical Therapy Treatment/Reassessment  Patient Details  Name: Sabrina Deleon MRN: [id number] Date of Birth: Dec 26, 1950 Referring Provider: Melina Schools, MD   Encounter Date: 04/20/2017  PT End of Session - 04/20/17 0946    Visit Number  12    Number of Visits  21    Date for PT Re-Evaluation  05/18/17    Authorization Type  [employer] Medicare    Authorization Time Period  03/09/17 to 04/20/17; NEW: 04/20/17 to 05/18/17    Authorization - Visit Number  12    Authorization - Number of Visits  21    PT Start Time  0945    PT Stop Time  5400    PT Time Calculation (min)  43 min    Activity Tolerance  Patient tolerated treatment well;Patient limited by pain;No increased pain    Behavior During Therapy  WFL for tasks assessed/performed       Past Medical History:  Diagnosis Date  . Aortic stenosis   . Arthritis   . Chronic kidney disease    Dr. Tami Ribas- noted increase kidney function levels- took pt off Metformin and started on Onglyza for Blood sugar control-pt being followed for this.  Marland Kitchen GERD (gastroesophageal reflux disease)   . Gout   . Hypertension   . Hypothyroidism   . Type 2 diabetes mellitus ([place])   . Wears glasses   . Wears partial dentures    Upper    Past Surgical  History:  Procedure Laterality Date  . BACK SURGERY     Lumbar  . CARPAL TUNNEL RELEASE  11/02/2011   Procedure: CARPAL TUNNEL RELEASE;  Surgeon: Tennis Must, MD;  Location: [facility];  Service: Orthopedics;  Laterality: Right;  . CARPAL TUNNEL RELEASE Left 01/14/2015   Procedure: LEFT CARPAL TUNNEL RELEASE;  Surgeon: Leanora Cover, MD;  Location: [facility];  Service: Orthopedics;  Laterality: Left;  . CHOLECYSTECTOMY     laparoscopic  . COLONOSCOPY    . COLONOSCOPY N/A 11/12/2015   Procedure: COLONOSCOPY;  Surgeon: Rogene Houston, MD;  [facility];  Service: Endoscopy;  Laterality: N/A;  9:30  . ESOPHAGOGASTRODUODENOSCOPY N/A 11/12/2015   Procedure: ESOPHAGOGASTRODUODENOSCOPY (EGD);  Surgeon: Rogene Houston, MD;  [facility];  Service: Endoscopy;  Laterality: N/A;  . TOTAL HIP ARTHROPLASTY Left 03/22/2016   Procedure: LEFT TOTAL HIP ARTHROPLASTY ANTERIOR APPROACH;  Surgeon: Gaynelle Arabian, MD;  [facility];  Service: Orthopedics;  Laterality: Left;    There were no vitals filed for this visit.  Subjective Assessment - 04/20/17 0946    Subjective  Pt states that she is okay. She said that she had some really great days earlier this week but then had some bad days  towards the end of the week. Pain is still a 2/10 when she moves it, 0/10 pain at rest.    Patient Stated Goals  no pain, get back to what I'm supposed to do    Currently in Pain?  No/denies         [facility] PT Assessment - 04/20/17 0001      AROM   Left Shoulder Flexion  108 Degrees was 100    Left Shoulder ABduction  65 Degrees was 65    Left Shoulder Internal Rotation  60 Degrees measured in supine; when sitting L3; was L5    Left Shoulder External Rotation  23 Degrees measured in supine; when sitting C7, min GH flexion; was c7    Cervical Flexion  46 was 45    Cervical Extension  40 was 34    Cervical - Right Side Bend  26 was 28    Cervical - Left Side Bend  34  was 34    Cervical - Right Rotation  65 was 63    Cervical - Left Rotation  48 was 56      PROM   Left Shoulder Flexion  90 Degrees was 90    Left Shoulder ABduction  90 Degrees was 90    Left Shoulder External Rotation  -- was 15      Strength   Right Shoulder Flexion  4+/5 was 4    Right Shoulder ABduction  4+/5 was 4    Right Shoulder Internal Rotation  5/5 was 4+    Right Shoulder External Rotation  5/5 was 4+    Left Shoulder Flexion  2+/5 was 2+    Left Shoulder ABduction  2+/5 was 2+    Left Shoulder Internal Rotation  5/5 was 4+    Left Shoulder External Rotation  4+/5 was 4+    Left Wrist Flexion  5/5 was 4+    Right Hand Grip (lbs)  25 was 23    Left Hand Grip (lbs)  18 was 15         PT Education - 04/20/17 0946    Education provided  Yes    Education Details  reassessment findings    Person(s) Educated  Patient    Methods  Explanation    Comprehension  Verbalized understanding       PT Short Term Goals - 04/20/17 0950      PT SHORT TERM GOAL #1   Title  Pt will be independent with HEP and perform consistently in order to maximize return to PLOF.    Baseline  2/1: performs at least every other day    Time  3    Period  Weeks    Status  Achieved      PT SHORT TERM GOAL #2   Title  Pt will have at least 5# increase in her gross grip strength of BUE to allow her to open bottles with greater ease.     Baseline  2/1: 18# L, 25# R    Time  3    Period  Weeks    Status  Achieved      PT SHORT TERM GOAL #3   Title  Pt will have improved LUE AROM by 15 deg or > throughout to decrease pain and maximize her ability to dress herself.     Baseline  2/1: see AROM    Time  3    Period  Weeks    Status  On-going  PT Long Term Goals - 04/20/17 0951      PT LONG TERM GOAL #1   Title  Pt will have improved LUE AROM to [facility] and with 3/10 shoulder pain or < to maximize pt's ability to get items in/out of cabinets [place] and to donn her bra with greater ease.      Baseline  2/1: AROM improving, not WFL yet; still has difficulty with donning under shirt    Time  6    Period  Weeks    Status  On-going      PT LONG TERM GOAL #2   Title  Pt will have improved cervical AROM by 15 deg or more throughout all ranges in order to decrease her pain maximize her ability to drive.     Baseline  2/1: cervical flexion improved by >15 deg, all others improved but was <15 deg    Time  6    Period  Weeks    Status  On-going      PT LONG TERM GOAL #3   Title  Pt will be able to demo proper sitting posture with lumbar roll at least 75% of the time in order to allow her to sit for at least 1 hour to maximize her function at work.     Baseline  1/11: states she feels she can sit for 1 hour at work with proper posture and then has to get up    Time  6    Period  Weeks    Status  Achieved      PT LONG TERM GOAL #4   Title  Pt's MMT will be at least 4+/5 throughout all muscle groups tested and her gross grip strength will improve to at least 25# bilaterally in order to decrease her pain and allow her to cook and perform household chores with greater ease.     Baseline  2/1: see MMT; LUE flexion and abd still 2+/5     Time  6    Period  Weeks    Status  Partially Met      PT LONG TERM GOAL #5   Title  Pt will score 14 or < on the NDI in order to demo improved overall perceived disability from her neck pain.    Time  6    Period  Weeks    Status  New            Plan - 04/20/17 1225    Clinical Impression Statement  PT reassessed pt's goals and outcome measures this date. Pt has made progress towards goals as illustrated above. Her overall strength has improved in BUE, expect for L GH flexion and abd as she can still not raise it through the full ROM. However, her LUE AROM did improve from her last reassessment. Pt does feel like therapy is helping as she is not in constant pain anymore; it is intermittent and usually occurs when she actively moves the arm.  She's also having more good days than bad. Her main deficits remaining are her AROM, strength, endurance, and picking items up. Pt is able to pinpoint her pain location at her anterior/middle deltoid and states that the entire side of her arm aches. PT unsure if pt's pain is being referred from trigger points in the anterior/middle deltoid region or if it is cervical radiculopathy. Pt has no h/o cervical surgeries/fusions so PT recommends trialing cervical traction to increase cervical facet joint space to increase space for cervical  nerve roots in order to assess cause of LUE pain and determine if radicular or MSK in nature. PT recommending extension of 2x/week for 4 more weeks to continue to address deficits in order to decrease pain and improve overall function at home and work.     Rehab Potential  Fair    PT Frequency  2x / week    PT Duration  4 weeks    PT Treatment/Interventions  ADLs/Self Care Home Management;Cryotherapy;Electrical Stimulation;Moist Heat;Functional mobility training;Therapeutic activities;Therapeutic exercise;Balance training;Neuromuscular re-education;Patient/family education;Manual techniques;Passive range of motion;Dry needling;Energy conservation;Taping;Splinting;Traction;Ultrasound    PT Next Visit Plan  Cervical traction; Continue with appropriate stretches and manual to elicit improved mobility. LUE A/AAROM, wall walking, etc.    PT Home Exercise Plan  eval: yellow putty; 1/2: supine cervical retractions, supine GH flexion AAROM with wand  1/22:  updated with pink putty    Consulted and Agree with Plan of Care  Patient       Patient will benefit from skilled therapeutic intervention in order to improve the following deficits and impairments:  Decreased activity tolerance, Decreased range of motion, Decreased strength, Hypomobility, Increased muscle spasms, Impaired flexibility, Impaired UE functional use, Postural dysfunction, Obesity, Pain  Visit Diagnosis: Chronic  left shoulder pain - Plan: PT plan of care cert/re-cert  Cervicalgia - Plan: PT plan of care cert/re-cert  Chronic right shoulder pain - Plan: PT plan of care cert/re-cert  Muscle weakness (generalized) - Plan: PT plan of care cert/re-cert  Other symptoms and signs involving the musculoskeletal system - Plan: PT plan of care cert/re-cert     Problem List Patient Active Problem List   Diagnosis Date Noted  . OA (osteoarthritis) of hip 03/22/2016  . Chronic right hip pain 10/29/2015  . Chronic kidney disease (CKD) stage G3b/A1, moderately decreased glomerular filtration rate (GFR) between 30-44 mL/min/1.73 square meter and albuminuria creatinine ratio less than 30 mg/g (HCC) 06/03/2015  . Type 2 diabetes mellitus ([place]) 11/09/2014  . Diastolic dysfunction, left ventricle 05/16/2013  . Calcific aortic stenosis 05/16/2013  . Gout 05/12/2013  . Diabetic retinopathy ([place]) 12/17/2012  . Hypertension 06/10/2012  . Type II or unspecified type diabetes mellitus without mention of complication, uncontrolled 06/10/2012  . Hyperlipidemia 06/10/2012  . Hypothyroidism 06/10/2012       Geraldine Solar PT, [place] [address] [place], [name], [address] Phone: [phone]   Fax:  [phone]  Name: Sabrina Deleon MRN: [id number] Date of Birth: January 05, 1951

## 2017-04-23 ENCOUNTER — Telehealth: Payer: Self-pay | Admitting: Nurse Practitioner

## 2017-04-23 NOTE — Telephone Encounter (Signed)
Pt last had labs draw 01/02/2017 and they were Bmet,hepatic,lipase,cbc,hepatic fx panel.Please advise the same?

## 2017-04-23 NOTE — Telephone Encounter (Signed)
I called and left a message to r/c. 

## 2017-04-23 NOTE — Telephone Encounter (Signed)
Patient has an appt with Hoyle Sauer for a f/u on Friday.  Does she need bloodwork done in advance?

## 2017-04-23 NOTE — Telephone Encounter (Signed)
Her labs are up to date since [place] ordered them in October. We will do a A1C while she is here in the office.

## 2017-04-24 ENCOUNTER — Telehealth (HOSPITAL_COMMUNITY): Payer: Self-pay | Admitting: Family Medicine

## 2017-04-24 NOTE — Telephone Encounter (Signed)
Pt is aware per [place].

## 2017-04-24 NOTE — Telephone Encounter (Signed)
2/5/19pt cx she said she has the flu

## 2017-04-25 ENCOUNTER — Ambulatory Visit (HOSPITAL_COMMUNITY): Payer: Medicare Other | Admitting: Physical Therapy

## 2017-04-25 ENCOUNTER — Encounter: Payer: Self-pay | Admitting: Nurse Practitioner

## 2017-04-25 ENCOUNTER — Ambulatory Visit (INDEPENDENT_AMBULATORY_CARE_PROVIDER_SITE_OTHER): Payer: Medicare Other | Admitting: Nurse Practitioner

## 2017-04-25 VITALS — BP 118/82 | Temp 98.5°F | Ht 69.0 in | Wt 231.6 lb

## 2017-04-25 DIAGNOSIS — J069 Acute upper respiratory infection, unspecified: Secondary | ICD-10-CM | POA: Diagnosis not present

## 2017-04-25 MED ORDER — AMOXICILLIN-POT CLAVULANATE 875-125 MG PO TABS: 1.0000 | ORAL_TABLET | Freq: Two times a day (BID) | ORAL | 0 refills | Status: DC

## 2017-04-25 NOTE — Progress Notes (Signed)
SUBJECTIVE:  CHIEF COMPLAINT:   Sinusitis  HPI:   Patient has been experiencing cough, congestion, chills, fever, and sore throat.  Patients states that she was feeling generally unwell Sunday 2/6 and that symptoms started Monday evening and progressively worsened until Tuesday night;  She feels a little better today.  She reported her highest fever was 103.5 Tuesday evening; she took tylenol and her fever broke later that night.  She has also been experiencing a headache 3/10 across her forehead.  She states that her cough is constant throughout the day and has woken her up at night.  Talking and laughing aggravate the cough. She is sometimes short of breath after coughing episodes.  She has been coughing up yellow drainage and has yellow rhinorrhea. She reports that Mucinex has helped with her cough.   Past Medical History:  Diagnosis Date  . Aortic stenosis   . Arthritis   . Chronic kidney disease    Dr. Tami Ribas- noted increase kidney function levels- took pt off Metformin and started on Onglyza for Blood sugar control-pt being followed for this.  Marland Kitchen GERD (gastroesophageal reflux disease)   . Gout   . Hypertension   . Hypothyroidism   . Type 2 diabetes mellitus ([place])   . Wears glasses   . Wears partial dentures    Upper      Past Surgical History:  Procedure Laterality Date  . BACK SURGERY     Lumbar  . CARPAL TUNNEL RELEASE  11/02/2011   Procedure: CARPAL TUNNEL RELEASE;  Surgeon: Tennis Must, MD;  Location: [place];  Service: Orthopedics;  Laterality: Right;  . CARPAL TUNNEL RELEASE Left 01/14/2015   Procedure: LEFT CARPAL TUNNEL RELEASE;  Surgeon: Leanora Cover, MD;  Location: [facility];  Service: Orthopedics;  Laterality: Left;  . CHOLECYSTECTOMY     laparoscopic  . COLONOSCOPY    . COLONOSCOPY N/A 11/12/2015   Procedure: COLONOSCOPY;  Surgeon: Rogene Houston, MD;  [facility];  Service: Endoscopy;  Laterality: N/A;  9:30  .  ESOPHAGOGASTRODUODENOSCOPY N/A 11/12/2015   Procedure: ESOPHAGOGASTRODUODENOSCOPY (EGD);  Surgeon: Rogene Houston, MD;  [facility];  Service: Endoscopy;  Laterality: N/A;  . TOTAL HIP ARTHROPLASTY Left 03/22/2016   Procedure: LEFT TOTAL HIP ARTHROPLASTY ANTERIOR APPROACH;  Surgeon: Gaynelle Arabian, MD;  [facility];  Service: Orthopedics;  Laterality: Left;      ACCIDENTS/INJURIES:   Denies   IMMUNIZATIONS:  Up to date.   HOSPITALIZATIONS:  None recent   FAMILY HISTORY:  Mother- deceased; Father- deceased.   OCCUPATION:  secratary   LIVING ENVIRONMENT:   Lives with husband.  Social History   Tobacco Use  . Smoking status: Former Smoker    Types: Cigarettes    Last attempt to quit: 10/29/1981    Years since quitting: 35.5  . Smokeless tobacco: Never Used  Substance Use Topics  . Alcohol use: Yes    Comment: occ  . Drug use: No      DIET:   Patient states that she tries to eat healthy.     EXERCISE: Patient tries to take walks a few times a week.   TRAVEL:   Has not traveled out of the country in the last six months.   SEXUAL HEALTH: Patient denies vaginal, oral, and anal sex.   ALLERGIES:   NKA  CURRENT MEDICATIONS:    Current Meds  Medication Sig  . allopurinol (ZYLOPRIM) 100 MG tablet take 1 tablet  by mouth twice a day  . citalopram (CELEXA) 20 MG tablet Take 1 tablet (20 mg total) by mouth daily.  . colchicine 0.6 MG tablet Take 0.6 mg by mouth as needed.  . ferrous sulfate 325 (65 FE) MG tablet Take 325 mg by mouth daily with breakfast.  . furosemide (LASIX) 40 MG tablet take 1 tablet by mouth once daily  . glipiZIDE (GLUCOTROL) 5 MG tablet Take 1/2 tablet by mouth twice daily.  Marland Kitchen glucose blood test strip 1 each by Other route as needed for other. Use as instructed  . HYDROcodone-acetaminophen (NORCO/VICODIN) 5-325 MG tablet Take 1 tablet by mouth 3 (three) times daily as needed.  Marland Kitchen ketoconazole (NIZORAL) 2 % cream Apply 1 application topically  as needed for irritation.  Marland Kitchen levothyroxine (SYNTHROID, LEVOTHROID) 125 MCG tablet take 1 tablet by mouth once daily  . lisinopril (PRINIVIL,ZESTRIL) 20 MG tablet Take 1 tablet (20 mg total) by mouth daily.  . pantoprazole (PROTONIX) 40 MG tablet Take 1 tablet (40 mg total) by mouth daily.  . pravastatin (PRAVACHOL) 40 MG tablet take 1 tablet by mouth once daily  . sucralfate (CARAFATE) 1 g tablet Take 1 tablet by mouth three times a day for 14 days.  Marland Kitchen zolpidem (AMBIEN) 10 MG tablet take 1 tablet by mouth at bedtime for sleep      ROS:     General: Fatigue since 04/23/17, attests to fevers and chills; denies weight loss or gain.  Head:  See HPI; states that "head feels like it weighs a ton."  Eyes: denies pain, drainage, itchiness, blurry vision or changes in vision.  Ears: denies pain, drainage, and changes in hearing.  Nose:  See HPI;  Feels "stuffy," has trouble breathing through nose, no loss of smell.  Mouth:  No sores, pain, or change in taste.  Throat: See HPI;  No pain with swallowing;  Some pain with coughing;  Respiratory:  See HPI;  Attests to wheezing but states it goes away after coughing up mucus.  Cardiovascular:  Denies chest pain, irregular heart beat or palpitations.  GI:  Attests to a decreased appetite but states that she is drinking a lot of fluid.  GU: No change in urination.  MS:  No muscle aches.  OBJECTIVE:    Vitals:   04/25/17 0836  BP: 118/82  Temp: 98.5 F (36.9 C)   General: Ill appearing patient, well groomed, appears to be stated age.  Eyes: No drainage, sclera clear.  Ears:  No redness or swelling on outer or inner ears.  TM pearly gray with positive cone of light, bony landmarks visible.  Some serous fluid observed behind TM.  Nose:  Turbinates red and boggy, some clear drainage.  Mouth:  Lips symmetrical with no lesions, buccal membranes pink and moist with no ulcerations.  Tongue pink and moist with no ulcerations.  Throat:  Pharynx is red, irritated, and cloudy drainage is present.  Lymph nodes:  Anterior cervical lymph nodes palpable and mobile, less than 1 cm.  Respiratory:  Lungs clear, no wheezes, rales, or other adventitious lung sounds.  Cardiovascular: RRR, S1S2 auscultated, no murmurs or other adventitious sounds.  ASSESSMENT:   Acute upper respiratory infection    PLAN:   Treatments/medications:   Meds ordered this encounter  Medications  . amoxicillin-clavulanate (AUGMENTIN) 875-125 MG tablet    Sig: Take 1 tablet by mouth 2 (two) times daily.    Dispense:  20 tablet    Refill:  0    Order  Specific Question:   Supervising Provider    Answer:   Mikey Kirschner [[id number]]     Continue OTC tylenol and Mucinex as needed.  Patient teaching: Continue to rest and drink plenty of fluids, try to drink more water as opposed to fruit juice and soda. Continue taking your regularly prescribed medications.  Be sure to check your blood sugar at least twice a day.  Blood sugars can be higher during an illness, so it is important to continue taking your diabetes medications even if you are not eating as much. If you have nausea and vomiting that prevents you from eating or drinking anything go to the hospital.  Be sure to take all of the antibiotics prescribed, do not stop taking them when you start to feel better.  Follow-up appointments: Return if symptoms worsen or fail to improve.

## 2017-04-27 ENCOUNTER — Encounter: Payer: Self-pay | Admitting: Nurse Practitioner

## 2017-04-27 ENCOUNTER — Ambulatory Visit (INDEPENDENT_AMBULATORY_CARE_PROVIDER_SITE_OTHER): Payer: Medicare Other | Admitting: Nurse Practitioner

## 2017-04-27 VITALS — BP 130/88 | Ht 69.0 in | Wt 234.2 lb

## 2017-04-27 DIAGNOSIS — Z79891 Long term (current) use of opiate analgesic: Secondary | ICD-10-CM

## 2017-04-27 DIAGNOSIS — F329 Major depressive disorder, single episode, unspecified: Secondary | ICD-10-CM | POA: Diagnosis not present

## 2017-04-27 DIAGNOSIS — F5104 Psychophysiologic insomnia: Secondary | ICD-10-CM

## 2017-04-27 DIAGNOSIS — F419 Anxiety disorder, unspecified: Secondary | ICD-10-CM

## 2017-04-27 MED ORDER — HYDROCODONE-ACETAMINOPHEN 5-325 MG PO TABS: 1.0000 | ORAL_TABLET | Freq: Three times a day (TID) | ORAL | 0 refills | Status: DC | PRN

## 2017-04-27 MED ORDER — ESZOPICLONE 1 MG PO TABS: 1.0000 mg | ORAL_TABLET | Freq: Every evening | ORAL | 2 refills | Status: DC | PRN

## 2017-04-27 MED ORDER — CITALOPRAM HYDROBROMIDE 20 MG PO TABS
ORAL_TABLET | ORAL | 5 refills | Status: DC
Start: 2017-04-27 — End: 2017-08-24

## 2017-04-27 NOTE — Progress Notes (Signed)
Subjective: This patient was seen today for chronic pain  The medication list was reviewed and updated.   -Compliance with medication: takes prn  - Number patient states they take daily: varies depending on pain level; has only taken 1/2 pill since being sick this week; worse with mobility  -when was the last dose patient took? None today  The patient was advised the importance of maintaining medication and not using illegal substances with these.  Here for refills and follow up  The patient was educated that we can provide 3 monthly scripts for their medication, it is their responsibility to follow the instructions.  Side effects or complications from medications: none  Patient is aware that pain medications are meant to minimize the severity of the pain to allow their pain levels to improve to allow for better function. They are aware of that pain medications cannot totally remove their pain.  Due for UDT ( at least once per year) : Is due but hold for today since she has taken minimal medicine this week; plan to do this at next pain management visit.  PMP reviewed.  PT helps her shoulder left pain but she can no longer afford to go. Still doing exercises. Hard to lift her arm to comb her hair. Average pain is 6-7/10. Pain medication brings it down to 2-3/10. Needs a few extra pain pills to help this so she can continue work. Takes 1/2 pill most of the time.   Depression screen PHQ 2/9 04/27/2017  Decreased Interest 2  Down, Depressed, Hopeless 2  PHQ - 2 Score 4  Altered sleeping 3  Tired, decreased energy 2  Change in appetite 1  Feeling bad or failure about yourself  3  Trouble concentrating 2  Moving slowly or fidgety/restless 1  Suicidal thoughts 0  PHQ-9 Score 16   GAD 7 : Generalized Anxiety Score 04/27/2017  Nervous, Anxious, on Edge 2  Control/stop worrying 3  Worry too much - different things 3  Trouble relaxing 2  Restless 1  Easily annoyed or irritable 3  Afraid  - awful might happen 1  Total GAD 7 Score 15  Anxiety Difficulty Somewhat difficult   Increased stress mainly related to work. Very unhappy at her current job but feels she still needs to work for financial reasons. Ambien not working very well for sleep.  Objective: NAD. Alert, oriented. Mildly anxious affect. Crying at times when discussing her job. Thoughts logical, coherent and relevant. Dressed appropriately. Making good eye contact. Lungs clear. Heart RRR.   Assessment:  Problem List Items Addressed This Visit      Other   Anxiety and depression   Relevant Medications   citalopram (CELEXA) 20 MG tablet   Encounter for long-term opiate analgesic use - Primary   Psychophysiological insomnia     Plan:  Meds ordered this encounter  Medications  . citalopram (CELEXA) 20 MG tablet    Sig: Take 1 1/2 tabs po qd    Dispense:  45 tablet    Refill:  5    Order Specific Question:   Supervising Provider    Answer:   Mikey Kirschner [[id number]]  . DISCONTD: HYDROcodone-acetaminophen (NORCO/VICODIN) 5-325 MG tablet    Sig: Take 1 tablet by mouth 3 (three) times daily as needed.    Dispense:  45 tablet    Refill:  0    Order Specific Question:   Supervising Provider    Answer:   Mikey Kirschner [[id number]]  .  eszopiclone (LUNESTA) 1 MG TABS tablet    Sig: Take 1 tablet (1 mg total) by mouth at bedtime as needed for sleep.    Dispense:  30 tablet    Refill:  2    Order Specific Question:   Supervising Provider    Answer:   Mikey Kirschner [[id number]]  . DISCONTD: HYDROcodone-acetaminophen (NORCO/VICODIN) 5-325 MG tablet    Sig: Take 1 tablet by mouth 3 (three) times daily as needed.    Dispense:  45 tablet    Refill:  0    May fill 30 days from 04/27/17    Order Specific Question:   Supervising Provider    Answer:   Mikey Kirschner [[id number]]  . HYDROcodone-acetaminophen (NORCO/VICODIN) 5-325 MG tablet    Sig: Take 1 tablet by mouth 3 (three) times daily as needed.    Dispense:  45 tablet     Refill:  0    May fill 60 days from 04/27/17    Order Specific Question:   Supervising Provider    Answer:   Mikey Kirschner [[id number]]   Increase Celexa to 30 mg. Go back to 20 mg dose if any problems and notify office.  Stop Ambien. Start Lunesta as directed. Discussed potential adverse effects. Do not take within 4 hours of pain medication. DC if any problems.  Given 15 extra pain pills per month due to increased shoulder pain.  Discussed importance of stress reduction. Encouraged patient to look at other employment opportunities. Return in about 3 months (around 07/25/2017) for pain management.

## 2017-04-28 ENCOUNTER — Encounter: Payer: Self-pay | Admitting: Nurse Practitioner

## 2017-04-28 DIAGNOSIS — F419 Anxiety disorder, unspecified: Secondary | ICD-10-CM | POA: Insufficient documentation

## 2017-04-28 DIAGNOSIS — Z79891 Long term (current) use of opiate analgesic: Secondary | ICD-10-CM | POA: Insufficient documentation

## 2017-04-28 DIAGNOSIS — F5104 Psychophysiologic insomnia: Secondary | ICD-10-CM | POA: Insufficient documentation

## 2017-04-28 DIAGNOSIS — F329 Major depressive disorder, single episode, unspecified: Secondary | ICD-10-CM | POA: Insufficient documentation

## 2017-05-02 ENCOUNTER — Telehealth: Payer: Self-pay

## 2017-05-02 NOTE — Telephone Encounter (Signed)
Patient last seen on last Friday. She is now calling stating her joint and muscle pain had returned Sunday. Please advise.

## 2017-05-03 ENCOUNTER — Other Ambulatory Visit: Payer: Self-pay | Admitting: *Deleted

## 2017-05-03 DIAGNOSIS — M791 Myalgia, unspecified site: Secondary | ICD-10-CM

## 2017-05-03 DIAGNOSIS — R5383 Other fatigue: Secondary | ICD-10-CM

## 2017-05-03 DIAGNOSIS — M2550 Pain in unspecified joint: Secondary | ICD-10-CM

## 2017-05-03 DIAGNOSIS — D509 Iron deficiency anemia, unspecified: Secondary | ICD-10-CM

## 2017-05-03 NOTE — Telephone Encounter (Signed)
Discussed with pt. Pt verbalized understanding. Orders put in for bloodwork.

## 2017-05-03 NOTE — Telephone Encounter (Signed)
Many things could be adding to her pain based on her history: anemia, diabetes, but especially stress. Please order ANA, RA factor, sed rate and CBC with diff. This will look at anemia, rheumatoid arthritis and lupus. Diagnosis: myalgia, arthralgia, fatigue and iron def anemia. Thanks.

## 2017-05-04 DIAGNOSIS — D509 Iron deficiency anemia, unspecified: Secondary | ICD-10-CM | POA: Diagnosis not present

## 2017-05-04 DIAGNOSIS — M791 Myalgia, unspecified site: Secondary | ICD-10-CM | POA: Diagnosis not present

## 2017-05-04 DIAGNOSIS — R5383 Other fatigue: Secondary | ICD-10-CM | POA: Diagnosis not present

## 2017-05-04 DIAGNOSIS — M2550 Pain in unspecified joint: Secondary | ICD-10-CM | POA: Diagnosis not present

## 2017-05-05 LAB — ANA: Anti Nuclear Antibody(ANA): NEGATIVE

## 2017-05-05 LAB — CBC WITH DIFFERENTIAL/PLATELET
Basophils Absolute: 0 10*3/uL (ref 0.0–0.2)
Basos: 0 %
EOS (ABSOLUTE): 0.4 10*3/uL (ref 0.0–0.4)
Eos: 5 %
HEMOGLOBIN: 10 g/dL — AB (ref 11.1–15.9)
Hematocrit: 31 % — ABNORMAL LOW (ref 34.0–46.6)
IMMATURE GRANS (ABS): 0 10*3/uL (ref 0.0–0.1)
Immature Granulocytes: 0 %
LYMPHS: 31 %
Lymphocytes Absolute: 2.1 10*3/uL (ref 0.7–3.1)
MCH: 27.9 pg (ref 26.6–33.0)
MCHC: 32.3 g/dL (ref 31.5–35.7)
MCV: 86 fL (ref 79–97)
MONOCYTES: 8 %
Monocytes Absolute: 0.5 10*3/uL (ref 0.1–0.9)
NEUTROS ABS: 3.8 10*3/uL (ref 1.4–7.0)
NEUTROS PCT: 56 %
PLATELETS: 273 10*3/uL (ref 150–379)
RBC: 3.59 x10E6/uL — AB (ref 3.77–5.28)
RDW: 15.7 % — ABNORMAL HIGH (ref 12.3–15.4)
WBC: 6.9 10*3/uL (ref 3.4–10.8)

## 2017-05-05 LAB — SEDIMENTATION RATE: SED RATE: 31 mm/h (ref 0–40)

## 2017-05-05 LAB — RHEUMATOID FACTOR

## 2017-05-08 ENCOUNTER — Telehealth (HOSPITAL_COMMUNITY): Payer: Self-pay

## 2017-05-08 ENCOUNTER — Telehealth: Payer: Self-pay | Admitting: Cardiology

## 2017-05-08 NOTE — Telephone Encounter (Signed)
Echo schedule din [facility] on June19,2019

## 2017-05-08 NOTE — Telephone Encounter (Signed)
Pt requested to be d/c and appreciates all that we did for her, she is doing better but still needs to return to MD to get more information on this issue

## 2017-05-11 ENCOUNTER — Encounter (HOSPITAL_COMMUNITY): Payer: Medicare Other

## 2017-05-15 ENCOUNTER — Telehealth: Payer: Self-pay | Admitting: Family Medicine

## 2017-05-15 ENCOUNTER — Encounter (HOSPITAL_COMMUNITY): Payer: Medicare Other | Admitting: Physical Therapy

## 2017-05-15 ENCOUNTER — Other Ambulatory Visit: Payer: Self-pay

## 2017-05-15 MED ORDER — KETOCONAZOLE 2 % EX CREA: 1.0000 "application " | TOPICAL_CREAM | CUTANEOUS | 0 refills | Status: DC | PRN

## 2017-05-15 NOTE — Telephone Encounter (Signed)
Pt aware med sent in

## 2017-05-15 NOTE — Telephone Encounter (Signed)
Patient needs refill on Clobetasol cream called into [facility] [address]

## 2017-05-18 ENCOUNTER — Encounter (HOSPITAL_COMMUNITY): Payer: Medicare Other

## 2017-06-07 ENCOUNTER — Encounter (HOSPITAL_COMMUNITY): Payer: Self-pay | Admitting: *Deleted

## 2017-06-07 ENCOUNTER — Emergency Department (HOSPITAL_COMMUNITY): Payer: Medicare Other

## 2017-06-07 ENCOUNTER — Other Ambulatory Visit: Payer: Self-pay

## 2017-06-07 ENCOUNTER — Emergency Department (HOSPITAL_COMMUNITY)
Admission: EM | Admit: 2017-06-07 | Discharge: 2017-06-07 | Disposition: A | Discharge status: Home / Self Care | Payer: Medicare Other | Attending: Emergency Medicine | Admitting: Emergency Medicine

## 2017-06-07 DIAGNOSIS — E1122 Type 2 diabetes mellitus with diabetic chronic kidney disease: Secondary | ICD-10-CM | POA: Insufficient documentation

## 2017-06-07 DIAGNOSIS — I880 Nonspecific mesenteric lymphadenitis: Secondary | ICD-10-CM | POA: Insufficient documentation

## 2017-06-07 DIAGNOSIS — I10 Essential (primary) hypertension: Secondary | ICD-10-CM | POA: Diagnosis not present

## 2017-06-07 DIAGNOSIS — N281 Cyst of kidney, acquired: Secondary | ICD-10-CM | POA: Diagnosis not present

## 2017-06-07 DIAGNOSIS — Z87891 Personal history of nicotine dependence: Secondary | ICD-10-CM | POA: Insufficient documentation

## 2017-06-07 DIAGNOSIS — N183 Chronic kidney disease, stage 3 (moderate): Secondary | ICD-10-CM | POA: Insufficient documentation

## 2017-06-07 DIAGNOSIS — E039 Hypothyroidism, unspecified: Secondary | ICD-10-CM | POA: Insufficient documentation

## 2017-06-07 DIAGNOSIS — Z79899 Other long term (current) drug therapy: Secondary | ICD-10-CM | POA: Diagnosis not present

## 2017-06-07 DIAGNOSIS — R1013 Epigastric pain: Secondary | ICD-10-CM | POA: Diagnosis not present

## 2017-06-07 DIAGNOSIS — Z96642 Presence of left artificial hip joint: Secondary | ICD-10-CM | POA: Insufficient documentation

## 2017-06-07 DIAGNOSIS — I129 Hypertensive chronic kidney disease with stage 1 through stage 4 chronic kidney disease, or unspecified chronic kidney disease: Secondary | ICD-10-CM | POA: Insufficient documentation

## 2017-06-07 DIAGNOSIS — E11319 Type 2 diabetes mellitus with unspecified diabetic retinopathy without macular edema: Secondary | ICD-10-CM | POA: Insufficient documentation

## 2017-06-07 DIAGNOSIS — R140 Abdominal distension (gaseous): Secondary | ICD-10-CM | POA: Diagnosis not present

## 2017-06-07 HISTORY — DX: Acute pancreatitis without necrosis or infection, unspecified: K85.90

## 2017-06-07 LAB — CBC WITH DIFFERENTIAL/PLATELET
BASOS ABS: 0 10*3/uL (ref 0.0–0.1)
BASOS PCT: 0 %
EOS PCT: 2 %
Eosinophils Absolute: 0.2 10*3/uL (ref 0.0–0.7)
HEMATOCRIT: 33.4 % — AB (ref 36.0–46.0)
Hemoglobin: 10.5 g/dL — ABNORMAL LOW (ref 12.0–15.0)
Lymphocytes Relative: 14 %
Lymphs Abs: 1.4 10*3/uL (ref 0.7–4.0)
MCH: 28.1 pg (ref 26.0–34.0)
MCHC: 31.4 g/dL (ref 30.0–36.0)
MCV: 89.3 fL (ref 78.0–100.0)
MONO ABS: 0.5 10*3/uL (ref 0.1–1.0)
MONOS PCT: 5 %
Neutro Abs: 7.6 10*3/uL (ref 1.7–7.7)
Neutrophils Relative %: 79 %
PLATELETS: 198 10*3/uL (ref 150–400)
RBC: 3.74 MIL/uL — ABNORMAL LOW (ref 3.87–5.11)
RDW: 15.1 % (ref 11.5–15.5)
WBC: 9.7 10*3/uL (ref 4.0–10.5)

## 2017-06-07 LAB — CBG MONITORING, ED: GLUCOSE-CAPILLARY: 114 mg/dL — AB (ref 65–99)

## 2017-06-07 LAB — LIPASE, BLOOD: Lipase: 58 U/L — ABNORMAL HIGH (ref 11–51)

## 2017-06-07 LAB — COMPREHENSIVE METABOLIC PANEL
ALBUMIN: 4.3 g/dL (ref 3.5–5.0)
ALT: 16 U/L (ref 14–54)
AST: 34 U/L (ref 15–41)
Alkaline Phosphatase: 61 U/L (ref 38–126)
Anion gap: 12 (ref 5–15)
BUN: 39 mg/dL — AB (ref 6–20)
CHLORIDE: 103 mmol/L (ref 101–111)
CO2: 23 mmol/L (ref 22–32)
CREATININE: 1.5 mg/dL — AB (ref 0.44–1.00)
Calcium: 9.4 mg/dL (ref 8.9–10.3)
GFR calc Af Amer: 41 mL/min — ABNORMAL LOW (ref >60)
GFR, EST NON AFRICAN AMERICAN: 35 mL/min — AB (ref >60)
Glucose, Bld: 129 mg/dL — ABNORMAL HIGH (ref 65–99)
Potassium: 4.1 mmol/L (ref 3.5–5.1)
SODIUM: 138 mmol/L (ref 135–145)
Total Bilirubin: 0.5 mg/dL (ref 0.3–1.2)
Total Protein: 7.6 g/dL (ref 6.5–8.1)

## 2017-06-07 MED ORDER — OXYCODONE-ACETAMINOPHEN 5-325 MG PO TABS: 1.0000 | ORAL_TABLET | Freq: Four times a day (QID) | ORAL | 0 refills | Status: DC | PRN

## 2017-06-07 MED ORDER — PANTOPRAZOLE SODIUM 40 MG IV SOLR
40.0000 mg | Freq: Once | INTRAVENOUS | Status: AC
  Administered 2017-06-07: 40 mg via INTRAVENOUS
  Filled 2017-06-07: qty 40

## 2017-06-07 MED ORDER — ONDANSETRON 4 MG PO TBDP: ORAL_TABLET | ORAL | 0 refills | Status: DC

## 2017-06-07 MED ORDER — ONDANSETRON HCL 4 MG/2ML IJ SOLN
4.0000 mg | Freq: Once | INTRAMUSCULAR | Status: AC
  Administered 2017-06-07: 4 mg via INTRAVENOUS
  Filled 2017-06-07: qty 2

## 2017-06-07 MED ORDER — HYDROMORPHONE HCL 1 MG/ML IJ SOLN
1.0000 mg | Freq: Once | INTRAMUSCULAR | Status: AC
  Administered 2017-06-07: 1 mg via INTRAVENOUS
  Filled 2017-06-07: qty 1

## 2017-06-07 MED ORDER — IOPAMIDOL (ISOVUE-300) INJECTION 61%
75.0000 mL | Freq: Once | INTRAVENOUS | Status: AC | PRN
  Administered 2017-06-07: 75 mL via INTRAVENOUS

## 2017-06-07 NOTE — ED Provider Notes (Signed)
Care transferred to me.  CT scan shows possible mesenteric adenitis.  Could also be possible lymphoproliferative disease and so she will need follow-up with repeat CT scan.  I discussed following up with her gastroenterologist next week.  Otherwise, she is feeling much better and appears stable for discharge home.  Labs are unremarkable compared to baseline.  Results for orders placed or performed during the hospital encounter of 06/07/17  Comprehensive metabolic panel  Result Value Ref Range   Sodium 138 135 - 145 mmol/L   Potassium 4.1 3.5 - 5.1 mmol/L   Chloride 103 101 - 111 mmol/L   CO2 23 22 - 32 mmol/L   Glucose, Bld 129 (H) 65 - 99 mg/dL   BUN 39 (H) 6 - 20 mg/dL   Creatinine, Ser 1.50 (H) 0.44 - 1.00 mg/dL   Calcium 9.4 8.9 - 10.3 mg/dL   Total Protein 7.6 6.5 - 8.1 g/dL   Albumin 4.3 3.5 - 5.0 g/dL   AST 34 15 - 41 U/L   ALT 16 14 - 54 U/L   Alkaline Phosphatase 61 38 - 126 U/L   Total Bilirubin 0.5 0.3 - 1.2 mg/dL   GFR calc non Af Amer 35 (L) >60 mL/min   GFR calc Af Amer 41 (L) >60 mL/min   Anion gap 12 5 - 15  Lipase, blood  Result Value Ref Range   Lipase 58 (H) 11 - 51 U/L  CBC with Differential  Result Value Ref Range   WBC 9.7 4.0 - 10.5 K/uL   RBC 3.74 (L) 3.87 - 5.11 MIL/uL   Hemoglobin 10.5 (L) 12.0 - 15.0 g/dL   HCT 33.4 (L) 36.0 - 46.0 %   MCV 89.3 78.0 - 100.0 fL   MCH 28.1 26.0 - 34.0 pg   MCHC 31.4 30.0 - 36.0 g/dL   RDW 15.1 11.5 - 15.5 %   Platelets 198 150 - 400 K/uL   Neutrophils Relative % 79 %   Neutro Abs 7.6 1.7 - 7.7 K/uL   Lymphocytes Relative 14 %   Lymphs Abs 1.4 0.7 - 4.0 K/uL   Monocytes Relative 5 %   Monocytes Absolute 0.5 0.1 - 1.0 K/uL   Eosinophils Relative 2 %   Eosinophils Absolute 0.2 0.0 - 0.7 K/uL   Basophils Relative 0 %   Basophils Absolute 0.0 0.0 - 0.1 K/uL  CBG monitoring, ED  Result Value Ref Range   Glucose-Capillary 114 (H) 65 - 99 mg/dL   Ct Abdomen Pelvis W Contrast  Result Date: 06/07/2017 CLINICAL DATA:   Worsening abdominal distension EXAM: CT ABDOMEN AND PELVIS WITH CONTRAST TECHNIQUE: Multidetector CT imaging of the abdomen and pelvis was performed using the standard protocol following bolus administration of intravenous contrast. CONTRAST:  62mL ISOVUE-300 IOPAMIDOL (ISOVUE-300) INJECTION 61% COMPARISON:  Ultrasound 01/05/2017 FINDINGS: Lower chest: Lung bases demonstrate no acute consolidation or pleural effusion. Dense mitral calcification. Normal heart size. Hepatobiliary: No focal hepatic abnormality. Post cholecystectomy. Prominent extrahepatic bile duct, likely due to postsurgical change Pancreas: Unremarkable. No pancreatic ductal dilatation or surrounding inflammatory changes. Spleen: Normal in size without focal abnormality. Adrenals/Urinary Tract: Adrenal glands are within normal limits. No hydronephrosis. Coarse calcification upper pole right kidney with associated cystic density, measuring 19 mm. Hyperattenuating exophytic 11 mm lesion upper pole left kidney. Bladder unremarkable. Stomach/Bowel: The stomach is nonenlarged. No dilated small bowel. No colon wall thickening. Negative appendix. Vascular/Lymphatic: Mild aortic atherosclerosis. No aneurysmal dilatation. Multiple enlarged mesenteric lymph nodes, some with calcification, nodes measure up  to 2.2 cm. Reproductive: Uterus and bilateral adnexa are unremarkable. Other: Negative for free air or free fluid. Musculoskeletal: Degenerative changes of the spine. No acute or suspicious bone lesion. IMPRESSION: 1. Negative for pancreatitis or bowel obstruction 2. Hazy infiltration of the central mesentery with multiple enlarged appearing lymph nodes. Findings could be secondary to mesenteric panniculitis/adenitis; could also consider lymphoproliferative disease as a cause for multiple enlarged mesenteric nodes. Short interval CT follow-up suggested to evaluate for stability or progression. 3. Coarse calcification upper pole right kidney with adjacent  indeterminate cystic density. Hyperattenuating 11 mm left upper pole lesion. When the patient is clinically stable and able to follow directions and hold their breath (preferably as an outpatient) further evaluation with dedicated abdominal MRI should be considered. Electronically Signed   By: Donavan Foil M.D.   On: 06/07/2017 22:06      Sherwood Gambler, MD 06/07/17 [phone]

## 2017-06-07 NOTE — Discharge Instructions (Addendum)
Follow up with your md or dr. Wonda Amis next week for recheck.  Return if problems

## 2017-06-07 NOTE — ED Provider Notes (Signed)
[facility] EMERGENCY DEPARTMENT Provider Note   CSN: [id number] Arrival date & time: 06/07/17  1921     History   Chief Complaint Chief Complaint  Patient presents with  . Abdominal Pain    HPI Sabrina Deleon is a 67 y.o. female.  Patient complains of epigastric abdominal pain.  Patient has a history of pancreatitis  The history is provided by the patient.  Abdominal Pain   This is a recurrent problem. The current episode started 6 to 12 hours ago. The problem occurs constantly. The problem has not changed since onset.The pain is associated with an unknown factor. The pain is located in the epigastric region. The quality of the pain is aching. The pain is at a severity of 7/10. The pain is moderate. Pertinent negatives include belching, diarrhea, frequency, hematuria and headaches.    Past Medical History:  Diagnosis Date  . Aortic stenosis   . Arthritis   . Chronic kidney disease    Dr. Tami Ribas- noted increase kidney function levels- took pt off Metformin and started on Onglyza for Blood sugar control-pt being followed for this.  Marland Kitchen GERD (gastroesophageal reflux disease)   . Gout   . Hypertension   . Hypothyroidism   . Pancreatitis   . Type 2 diabetes mellitus ([place])   . Wears glasses   . Wears partial dentures    Upper    Patient Active Problem List   Diagnosis Date Noted  . Anxiety and depression 04/28/2017  . Encounter for long-term opiate analgesic use 04/28/2017  . Psychophysiological insomnia 04/28/2017  . OA (osteoarthritis) of hip 03/22/2016  . Chronic right hip pain 10/29/2015  . Chronic kidney disease (CKD) stage G3b/A1, moderately decreased glomerular filtration rate (GFR) between 30-44 mL/min/1.73 square meter and albuminuria creatinine ratio less than 30 mg/g (HCC) 06/03/2015  . Type 2 diabetes mellitus ([place]) 11/09/2014  . Diastolic dysfunction, left ventricle 05/16/2013  . Calcific aortic stenosis 05/16/2013  . Gout 05/12/2013  . Diabetic  retinopathy ([place]) 12/17/2012  . Hypertension 06/10/2012  . Type II or unspecified type diabetes mellitus without mention of complication, uncontrolled 06/10/2012  . Hyperlipidemia 06/10/2012  . Hypothyroidism 06/10/2012    Past Surgical History:  Procedure Laterality Date  . BACK SURGERY     Lumbar  . CARPAL TUNNEL RELEASE  11/02/2011   Procedure: CARPAL TUNNEL RELEASE;  Surgeon: Tennis Must, MD;  Location: [facility];  Service: Orthopedics;  Laterality: Right;  . CARPAL TUNNEL RELEASE Left 01/14/2015   Procedure: LEFT CARPAL TUNNEL RELEASE;  Surgeon: Leanora Cover, MD;  Location: [place];  Service: Orthopedics;  Laterality: Left;  . CHOLECYSTECTOMY     laparoscopic  . COLONOSCOPY    . COLONOSCOPY N/A 11/12/2015   Procedure: COLONOSCOPY;  Surgeon: Rogene Houston, MD;  [facility]: [facility];  Service: Endoscopy;  Laterality: N/A;  9:30  . ESOPHAGOGASTRODUODENOSCOPY N/A 11/12/2015   Procedure: ESOPHAGOGASTRODUODENOSCOPY (EGD);  Surgeon: Rogene Houston, MD;  Location: [facility];  Service: Endoscopy;  Laterality: N/A;  . TOTAL HIP ARTHROPLASTY Left 03/22/2016   Procedure: LEFT TOTAL HIP ARTHROPLASTY ANTERIOR APPROACH;  Surgeon: Gaynelle Arabian, MD;  Location: [facility];  Service: Orthopedics;  Laterality: Left;    OB History   None      Home Medications    Prior to Admission medications   Medication Sig Start Date End Date Taking? Authorizing Provider  allopurinol (ZYLOPRIM) 100 MG tablet take 1 tablet by mouth twice a day 03/21/17  Kathyrn Drown, MD  amoxicillin-clavulanate (AUGMENTIN) 875-125 MG tablet Take 1 tablet by mouth 2 (two) times daily. 04/25/17   Nilda Simmer, NP  citalopram (CELEXA) 20 MG tablet Take 1 1/2 tabs po qd 04/27/17   Pearson Forster C, NP  colchicine 0.6 MG tablet Take 0.6 mg by mouth as needed.    [provider]  eszopiclone (LUNESTA) 1 MG TABS tablet Take 1 tablet (1 mg total) by mouth at bedtime as needed for  sleep. 04/27/17   Nilda Simmer, NP  ferrous sulfate 325 (65 FE) MG tablet Take 325 mg by mouth daily with breakfast.    [provider]  furosemide (LASIX) 40 MG tablet take 1 tablet by mouth once daily 03/26/17   Luking, Elayne Snare, MD  glipiZIDE (GLUCOTROL) 5 MG tablet Take 1/2 tablet by mouth twice daily. 01/02/17   Kathyrn Drown, MD  glucose blood test strip 1 each by Other route as needed for other. Use as instructed    [provider]  HYDROcodone-acetaminophen (NORCO/VICODIN) 5-325 MG tablet Take 1 tablet by mouth 3 (three) times daily as needed. 04/27/17   Nilda Simmer, NP  ketoconazole (NIZORAL) 2 % cream Apply 1 application topically as needed for irritation. 05/15/17   Kathyrn Drown, MD  levothyroxine (SYNTHROID, LEVOTHROID) 125 MCG tablet take 1 tablet by mouth once daily 03/05/17   Kathyrn Drown, MD  lisinopril (PRINIVIL,ZESTRIL) 20 MG tablet Take 1 tablet (20 mg total) by mouth daily. 04/02/17   Kathyrn Drown, MD  ondansetron (ZOFRAN ODT) 4 MG disintegrating tablet 4mg  ODT q4 hours prn nausea/vomit 06/07/17   Milton Ferguson, MD  oxyCODONE-acetaminophen (PERCOCET/ROXICET) 5-325 MG tablet Take 1 tablet by mouth every 6 (six) hours as needed. 06/07/17   Milton Ferguson, MD  pantoprazole (PROTONIX) 40 MG tablet Take 1 tablet (40 mg total) by mouth daily. 01/03/17   Kathyrn Drown, MD  pravastatin (PRAVACHOL) 40 MG tablet take 1 tablet by mouth once daily 06/19/16   Luking, Elayne Snare, MD  sucralfate (CARAFATE) 1 g tablet Take 1 tablet by mouth three times a day for 14 days. 01/03/17   Kathyrn Drown, MD    Family History No family history on file.  Social History Social History   Tobacco Use  . Smoking status: Former Smoker    Types: Cigarettes    Last attempt to quit: 10/29/1981    Years since quitting: 35.6  . Smokeless tobacco: Never Used  Substance Use Topics  . Alcohol use: Yes    Comment: occ  . Drug use: No     Allergies   Patient has no known  allergies.   Review of Systems Review of Systems  Constitutional: Negative for appetite change and fatigue.  HENT: Negative for congestion, ear discharge and sinus pressure.   Eyes: Negative for discharge.  Respiratory: Negative for cough.   Cardiovascular: Negative for chest pain.  Gastrointestinal: Positive for abdominal pain. Negative for diarrhea.  Genitourinary: Negative for frequency and hematuria.  Musculoskeletal: Negative for back pain.  Skin: Negative for rash.  Neurological: Negative for seizures and headaches.  Psychiatric/Behavioral: Negative for hallucinations.     Physical Exam Updated Vital Signs BP (!) 145/86   Pulse 71   Temp 98.8 F (37.1 C) (Oral)   Resp 19   Ht 5' 8.5" (1.74 m)   Wt 106.1 kg (234 lb)   SpO2 95%   BMI 35.06 kg/m   Physical Exam  Constitutional: She is  oriented to person, place, and time. She appears well-developed.  HENT:  Head: Normocephalic.  Eyes: Conjunctivae and EOM are normal. No scleral icterus.  Neck: Neck supple. No thyromegaly present.  Cardiovascular: Normal rate and regular rhythm. Exam reveals no gallop and no friction rub.  No murmur heard. Pulmonary/Chest: No stridor. She has no wheezes. She has no rales. She exhibits no tenderness.  Abdominal: She exhibits no distension. There is tenderness. There is no rebound.  Musculoskeletal: Normal range of motion. She exhibits no edema.  Lymphadenopathy:    She has no cervical adenopathy.  Neurological: She is oriented to person, place, and time. She exhibits normal muscle tone. Coordination normal.  Skin: No rash noted. No erythema.  Psychiatric: She has a normal mood and affect. Her behavior is normal.     ED Treatments / Results  Labs (all labs ordered are listed, but only abnormal results are displayed) Labs Reviewed  COMPREHENSIVE METABOLIC PANEL - Abnormal; Notable for the following components:      Result Value   Glucose, Bld 129 (*)    BUN 39 (*)     Creatinine, Ser 1.50 (*)    GFR calc non Af Amer 35 (*)    GFR calc Af Amer 41 (*)    All other components within normal limits  LIPASE, BLOOD - Abnormal; Notable for the following components:   Lipase 58 (*)    All other components within normal limits  CBC WITH DIFFERENTIAL/PLATELET - Abnormal; Notable for the following components:   RBC 3.74 (*)    Hemoglobin 10.5 (*)    HCT 33.4 (*)    All other components within normal limits  CBG MONITORING, ED - Abnormal; Notable for the following components:   Glucose-Capillary 114 (*)    All other components within normal limits    EKG  EKG Interpretation  Date/Time:  Thursday June 07 2017 20:07:06 EDT Ventricular Rate:  56 PR Interval:    QRS Duration: 105 QT Interval:  471 QTC Calculation: 455 R Axis:   -16 Text Interpretation:  Sinus rhythm Borderline left axis deviation Confirmed by Milton Ferguson [phone]) on 06/07/2017 9:49:10 PM       Radiology No results found.  Procedures Procedures (including critical care time)  Medications Ordered in ED Medications  HYDROmorphone (DILAUDID) injection 1 mg (1 mg Intravenous Given 06/07/17 2051)  ondansetron (ZOFRAN) injection 4 mg (4 mg Intravenous Given 06/07/17 2051)  pantoprazole (PROTONIX) injection 40 mg (40 mg Intravenous Given 06/07/17 2051)  iopamidol (ISOVUE-300) 61 % injection 75 mL (75 mLs Intravenous Contrast Given 06/07/17 2128)     Initial Impression / Assessment and Plan / ED Course  I have reviewed the triage vital signs and the nursing notes.  Pertinent labs & imaging results that were available during my care of the patient were reviewed by me and considered in my medical decision making (see chart for details).     Labs show mild dehydration.  Also elevated glucose and anemia.  She has a mild elevation of her lipase.  CT scan pending.  Patient improving with medicines and will follow up with her PCP or her GI doctor  Final Clinical Impressions(s) / ED Diagnoses     Final diagnoses:  Epigastric pain    ED Discharge Orders        Ordered    ondansetron (ZOFRAN ODT) 4 MG disintegrating tablet     06/07/17 2147    oxyCODONE-acetaminophen (PERCOCET/ROXICET) 5-325 MG tablet  Every 6 hours PRN  06/07/17 2147       Milton Ferguson, MD 06/07/17 2154

## 2017-06-07 NOTE — ED Triage Notes (Signed)
Pt c/o abd pain with nausea that has gotten worse throughout the day, has hx of pancreatitis and states that this feels the same,

## 2017-06-07 NOTE — ED Notes (Signed)
Pt ambulatory to waiting room. Pt verbalized understanding of discharge instructions.   

## 2017-06-07 NOTE — ED Notes (Signed)
Patient transported to CT 

## 2017-06-13 ENCOUNTER — Telehealth: Payer: Self-pay | Admitting: Family Medicine

## 2017-06-13 NOTE — Telephone Encounter (Signed)
Please let the patient know that I did review over her ER visit on March 21.  Her CAT scan did show some enlarged lymph nodes pains will need close follow-up.  The patient needs to schedule an office visit to discuss this with gastroenterology or with [place] within the next 2 weeks.  More than likely will need a follow-up scan this spring.  The patient definitely needs to follow-up regarding this.  Please document she understands this.

## 2017-06-14 NOTE — Telephone Encounter (Signed)
I called and left a message to r/c. 

## 2017-06-20 ENCOUNTER — Other Ambulatory Visit: Payer: Self-pay | Admitting: Family Medicine

## 2017-06-20 NOTE — Telephone Encounter (Signed)
Left message to return call 

## 2017-06-24 ENCOUNTER — Other Ambulatory Visit: Payer: Self-pay | Admitting: Family Medicine

## 2017-06-25 ENCOUNTER — Other Ambulatory Visit: Payer: Self-pay | Admitting: *Deleted

## 2017-06-25 MED ORDER — FUROSEMIDE 40 MG PO TABS: 40.0000 mg | ORAL_TABLET | Freq: Every day | ORAL | 0 refills | Status: DC

## 2017-06-25 NOTE — Telephone Encounter (Signed)
Patient is following up on this with Dr. Corbin Ade and has an appt scheduled with them on 06/29/2017.

## 2017-07-02 ENCOUNTER — Telehealth: Payer: Self-pay | Admitting: Nurse Practitioner

## 2017-07-02 ENCOUNTER — Other Ambulatory Visit: Payer: Self-pay | Admitting: Nurse Practitioner

## 2017-07-02 MED ORDER — HYDROCODONE-ACETAMINOPHEN 5-325 MG PO TABS: 1.0000 | ORAL_TABLET | Freq: Three times a day (TID) | ORAL | 0 refills | Status: DC | PRN

## 2017-07-02 NOTE — Telephone Encounter (Signed)
Patient requesting refill on her hydrocodone 5-325 mg.

## 2017-07-02 NOTE — Progress Notes (Signed)
Left message to return call 

## 2017-07-26 ENCOUNTER — Telehealth: Payer: Self-pay | Admitting: Nurse Practitioner

## 2017-07-26 ENCOUNTER — Other Ambulatory Visit: Payer: Self-pay | Admitting: Family Medicine

## 2017-07-26 NOTE — Telephone Encounter (Signed)
Done

## 2017-07-26 NOTE — Telephone Encounter (Signed)
Requesting refill of Ambien to [facility] [address]

## 2017-07-26 NOTE — Telephone Encounter (Signed)
Left message on voicemail letting pt know med was sent in

## 2017-08-14 ENCOUNTER — Other Ambulatory Visit: Payer: Self-pay | Admitting: Family Medicine

## 2017-08-15 ENCOUNTER — Ambulatory Visit (INDEPENDENT_AMBULATORY_CARE_PROVIDER_SITE_OTHER): Payer: Medicare Other | Admitting: Nurse Practitioner

## 2017-08-15 ENCOUNTER — Encounter: Payer: Self-pay | Admitting: Nurse Practitioner

## 2017-08-15 VITALS — BP 134/80 | Ht 68.5 in | Wt 239.0 lb

## 2017-08-15 DIAGNOSIS — E1122 Type 2 diabetes mellitus with diabetic chronic kidney disease: Secondary | ICD-10-CM

## 2017-08-15 DIAGNOSIS — E785 Hyperlipidemia, unspecified: Secondary | ICD-10-CM | POA: Diagnosis not present

## 2017-08-15 DIAGNOSIS — N183 Chronic kidney disease, stage 3 (moderate): Secondary | ICD-10-CM

## 2017-08-15 DIAGNOSIS — Z79891 Long term (current) use of opiate analgesic: Secondary | ICD-10-CM

## 2017-08-15 LAB — POCT GLYCOSYLATED HEMOGLOBIN (HGB A1C): Hemoglobin A1C: 6 % — AB (ref 4.0–5.6)

## 2017-08-15 MED ORDER — HYDROCODONE-ACETAMINOPHEN 5-325 MG PO TABS: 1.0000 | ORAL_TABLET | Freq: Three times a day (TID) | ORAL | 0 refills | Status: DC | PRN

## 2017-08-15 MED ORDER — ESZOPICLONE 1 MG PO TABS: 1.0000 mg | ORAL_TABLET | Freq: Every evening | ORAL | 2 refills | Status: DC | PRN

## 2017-08-16 ENCOUNTER — Encounter: Payer: Self-pay | Admitting: Nurse Practitioner

## 2017-08-16 NOTE — Addendum Note (Signed)
Addended by: Nilda Simmer on: 08/16/2017 01:16 PM   Modules accepted: Level of Service

## 2017-08-16 NOTE — Progress Notes (Addendum)
Subjective:  Presents for diabetes check up and chronic pain management.  Adherent to medication regimen.  Limited exercise.  Continues to work on her weight. Denies CP/ischemic type pain or SOB. No visual changes. No difficulty speaking or swallowing. No numbness or weakness of the face, arms or legs.  Does not check her blood sugar on a regular basis at home.  Continues to have difficulty sleeping, states she woke up 4 times last night.  Ambien does not seem to be working.  Was prescribed Lunesta at last visit but never received this.  Plans to make an appointment for her yearly eye exam.  Tries to take half of her hydrocodone 5 mg at a time which usually relieves her pain for several hours.  Her last dose was about 8 hours ago which is still helping.  Now having some left shoulder pain.  Difficulty raising or rotating her arm.  Difficulty calming the back of her hair or putting on her blouse.  Defers referral to specialist at this point due to lack of finances.  Denies any adverse effects with pain med.  Tries to take it only with severe pain. Attempted to review PMP but unavailable.   Objective:   BP 134/80   Ht 5' 8.5" (1.74 m)   Wt 239 lb (108.4 kg)   BMI 35.81 kg/m  NAD.  Alert, oriented.  Lungs clear.  Heart regular rate and rhythm.  Lower extremities no edema. Results for orders placed or performed in visit on 08/15/17  POCT glycosylated hemoglobin (Hb A1C)  Result Value Ref Range   Hemoglobin A1C 6.0 (A) 4.0 - 5.6 %   HbA1c, POC (prediabetic range)  5.7 - 6.4 %   HbA1c, POC (controlled diabetic range)  0.0 - 7.0 %   Diabetic Foot Exam - Simple   Simple Foot Form Diabetic Foot exam was performed with the following findings:  Yes 08/15/2017  9:30 AM  Visual Inspection See comments:  Yes Sensation Testing Intact to touch and monofilament testing bilaterally:  Yes Pulse Check See comments:  Yes Comments DP pulses present bilat. Toes warm with cap refill. Multiple areas of calluses  noted. Hammer toes several toes both sides.       Assessment:   Problem List Items Addressed This Visit      Endocrine   Type 2 diabetes mellitus ([place]) - Primary   Relevant Orders   POCT glycosylated hemoglobin (Hb A1C) (Completed)   Hepatic function panel     Other   Encounter for long-term opiate analgesic use   Relevant Orders   Hepatic function panel   Hyperlipidemia   Relevant Orders   Lipid Profile        Plan:   Meds ordered this encounter  Medications  . eszopiclone (LUNESTA) 1 MG TABS tablet    Sig: Take 1 tablet (1 mg total) by mouth at bedtime as needed for sleep.    Dispense:  30 tablet    Refill:  2    Order Specific Question:   Supervising Provider    Answer:   Mikey Kirschner [[id number]]  . DISCONTD: HYDROcodone-acetaminophen (NORCO/VICODIN) 5-325 MG tablet    Sig: Take 1 tablet by mouth 3 (three) times daily as needed.    Dispense:  45 tablet    Refill:  0    Order Specific Question:   Supervising Provider    Answer:   Mikey Kirschner [[id number]]  . DISCONTD: HYDROcodone-acetaminophen (NORCO/VICODIN) 5-325 MG tablet    Sig: Take  1 tablet by mouth 3 (three) times daily as needed.    Dispense:  45 tablet    Refill:  0    May fill 30 days from 08/15/17    Order Specific Question:   Supervising Provider    Answer:   Mikey Kirschner [[id number]]  . HYDROcodone-acetaminophen (NORCO/VICODIN) 5-325 MG tablet    Sig: Take 1 tablet by mouth 3 (three) times daily as needed.    Dispense:  45 tablet    Refill:  0    May fill 60 days from 08/15/17    Order Specific Question:   Supervising Provider    Answer:   Mikey Kirschner [[id number]]   Trial of Lunesta.  Do not take within 4 hours of taking her pain medication.  Encouraged regular walking program healthy diet and continued weight loss efforts.  Recommend urine drug screen and paperwork for chronic pain management at next visit. Return in about 4 months (around 12/16/2017). 25 minutes was spent with the patient.  This  statement verifies that 25 minutes was indeed spent with the patient. Greater than half the time was spent in discussion, counseling and answering questions  regarding the issues that the patient came in for today as reflected in the diagnosis (s) please refer to documentation for further details.

## 2017-08-22 ENCOUNTER — Other Ambulatory Visit: Payer: Self-pay | Admitting: Family Medicine

## 2017-08-24 ENCOUNTER — Other Ambulatory Visit: Payer: Self-pay | Admitting: Nurse Practitioner

## 2017-08-29 ENCOUNTER — Other Ambulatory Visit: Payer: Medicare Other

## 2017-09-05 ENCOUNTER — Other Ambulatory Visit: Payer: Medicare Other

## 2017-09-06 ENCOUNTER — Telehealth: Payer: Self-pay

## 2017-09-06 ENCOUNTER — Other Ambulatory Visit: Payer: Self-pay

## 2017-09-06 ENCOUNTER — Ambulatory Visit (INDEPENDENT_AMBULATORY_CARE_PROVIDER_SITE_OTHER): Payer: Medicare Other

## 2017-09-06 DIAGNOSIS — I350 Nonrheumatic aortic (valve) stenosis: Secondary | ICD-10-CM | POA: Diagnosis not present

## 2017-09-06 NOTE — Telephone Encounter (Signed)
Patient notified. Routed to PCP. Appointment scheduled.

## 2017-09-06 NOTE — Telephone Encounter (Signed)
-----   Message from Erma Heritage, [place] sent at 09/06/2017 10:37 AM EDT ----- Covering for Dr. Domenic Polite. Please let the patient know her echocardiogram showed normal pumping function of the heart with a preserved EF of 60-65% and no regional WMA. Her aortic stenosis remains severe and has slightly worsened since prior imaging in 02/2017 by review of valve gradients (blood flow across the valve). She was suppose to have 62-month follow-up with Dr. Domenic Polite but it does not look like this has been scheduled. Please arrange for her to see him in [place] within the next several weeks to further discuss her echo report in detail and plans for further management. Thank you.

## 2017-09-13 ENCOUNTER — Other Ambulatory Visit: Payer: Self-pay | Admitting: Family Medicine

## 2017-09-14 DIAGNOSIS — Z1231 Encounter for screening mammogram for malignant neoplasm of breast: Secondary | ICD-10-CM | POA: Diagnosis not present

## 2017-09-18 ENCOUNTER — Telehealth: Payer: Self-pay | Admitting: Family Medicine

## 2017-09-18 NOTE — Telephone Encounter (Signed)
Review screening mammogram results from [facility] in results folder.

## 2017-09-21 ENCOUNTER — Other Ambulatory Visit: Payer: Self-pay | Admitting: Family Medicine

## 2017-09-21 NOTE — Telephone Encounter (Signed)
.  nl

## 2017-10-05 ENCOUNTER — Telehealth: Payer: Self-pay

## 2017-10-05 ENCOUNTER — Other Ambulatory Visit: Payer: Self-pay | Admitting: Nurse Practitioner

## 2017-10-05 LAB — HM DIABETES EYE EXAM

## 2017-10-05 NOTE — Telephone Encounter (Signed)
Please have her check. She has at least one more Rx on file at [facility].

## 2017-10-05 NOTE — Telephone Encounter (Signed)
Tried calling Number busy.

## 2017-10-05 NOTE — Telephone Encounter (Signed)
Pt did have a rx already. No need for any further rx at this time.

## 2017-10-05 NOTE — Telephone Encounter (Signed)
Patient would like to have you get her a rx for her Hydrocodone 5-325.

## 2017-10-05 NOTE — Telephone Encounter (Signed)
Pt is aware and will call the drugstore to check.

## 2017-10-10 NOTE — H&P (View-Only) (Signed)
Cardiology Office Note  Date: 10/12/2017   ID: Maebel, Marasco [birth year], MRN [id number]  PCP: Kathyrn Drown, MD  Primary Cardiologist: Rozann Lesches, MD   Chief Complaint  Patient presents with  . Aortic Stenosis    History of Present Illness: Sabrina Deleon is a 67 y.o. female last seen in November 2018.  She is here today for a routine visit.  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  She reports NYHA class II dyspnea with most activities, does get fatigued by the end of the day and has to pace herself during the day.  She does not report active angina, has had no palpitations or syncope.  Follow-up echocardiogram in June revealed moderate LVH with LVEF 60 to 65%, severe aortic stenosis with mean gradient 44 mmHg.  Gradient has increased in comparison to December 2018.  I went over the results with her today and discussed the implications.  She continues on medical therapy as outlined below.  Today we discussed outpatient right and left heart catheterization to further evaluate her aortic stenosis and help time surgical management.  She would like to talk to her husband and then make plans from there.  Past Medical History:  Diagnosis Date  . Aortic stenosis   . Arthritis   . Chronic kidney disease    Dr. Tami Ribas- noted increase kidney function levels- took pt off Metformin and started on Onglyza for Blood sugar control-pt being followed for this.  Marland Kitchen GERD (gastroesophageal reflux disease)   . Gout   . Hypertension   . Hypothyroidism   . Pancreatitis   . Type 2 diabetes mellitus ([place])   . Wears glasses   . Wears partial dentures    Upper    Past Surgical History:  Procedure Laterality Date  . BACK SURGERY     Lumbar  . CARPAL TUNNEL RELEASE  11/02/2011   Procedure: CARPAL TUNNEL RELEASE;  Surgeon: Tennis Must, MD;  Location: [facility];  Service: Orthopedics;  Laterality: Right;  . CARPAL  TUNNEL RELEASE Left 01/14/2015   Procedure: LEFT CARPAL TUNNEL RELEASE;  Surgeon: Leanora Cover, MD;  Location: [facility];  Service: Orthopedics;  Laterality: Left;  . CHOLECYSTECTOMY     laparoscopic  . COLONOSCOPY    . COLONOSCOPY N/A 11/12/2015   Procedure: COLONOSCOPY;  Surgeon: Rogene Houston, MD;  [facility];  Service: Endoscopy;  Laterality: N/A;  9:30  . ESOPHAGOGASTRODUODENOSCOPY N/A 11/12/2015   Procedure: ESOPHAGOGASTRODUODENOSCOPY (EGD);  Surgeon: Rogene Houston, MD;  [facility];  Service: Endoscopy;  Laterality: N/A;  . TOTAL HIP ARTHROPLASTY Left 03/22/2016   Procedure: LEFT TOTAL HIP ARTHROPLASTY ANTERIOR APPROACH;  Surgeon: Gaynelle Arabian, MD;  [facility];  Service: Orthopedics;  Laterality: Left;    Current Outpatient Medications  Medication Sig Dispense Refill  . allopurinol (ZYLOPRIM) 100 MG tablet TAKE 1 TABLET BY MOUTH TWICE A DAY 180 tablet 2  . citalopram (CELEXA) 20 MG tablet TAKE 1 AND 1/2 TABLETS BY MOUTH ONCE DAILY 135 tablet 1  . colchicine 0.6 MG tablet Take 0.6 mg by mouth as needed.    . ferrous sulfate 325 (65 FE) MG tablet Take 325 mg by mouth daily with breakfast.    . furosemide (LASIX) 40 MG tablet TAKE 1 TABLET BY MOUTH ONCE DAILY 90 tablet 0  . glipiZIDE (GLUCOTROL) 5 MG tablet TAKE 1/2 TABLET BY MOUTH TWICE A DAY  90 tablet 0  . glucose blood test strip 1 each by Other route as needed for other. Use as instructed    . HYDROcodone-acetaminophen (NORCO/VICODIN) 5-325 MG tablet Take 1 tablet by mouth 3 (three) times daily as needed. 45 tablet 0  . ketoconazole (NIZORAL) 2 % cream Apply 1 application topically as needed for irritation. 15 g 0  . levothyroxine (SYNTHROID, LEVOTHROID) 125 MCG tablet TAKE 1 TABLET BY MOUTH ONCE DAILY 90 tablet 1  . lisinopril (PRINIVIL,ZESTRIL) 20 MG tablet TAKE 1 TABLET ONCE DAILY 90 tablet 1  . ondansetron (ZOFRAN ODT) 4 MG disintegrating tablet 4mg  ODT q4 hours prn nausea/vomit 12  tablet 0  . pantoprazole (PROTONIX) 40 MG tablet TAKE 1 TABLET BY MOUTH ONCE DAILY 150 tablet 0  . pravastatin (PRAVACHOL) 40 MG tablet TAKE 1 TABLET BY MOUTH EVERY DAY 90 tablet 1  . sucralfate (CARAFATE) 1 g tablet Take 1 tablet by mouth three times a day for 14 days. 42 tablet 0  . zolpidem (AMBIEN) 10 MG tablet TAKE 1 TABLET BY MOUTH AT BEDTIME 30 tablet 2   No current facility-administered medications for this visit.    Allergies:  Patient has no known allergies.   Social History: The patient  reports that she quit smoking about 35 years ago. Her smoking use included cigarettes. She has never used smokeless tobacco. She reports that she drinks alcohol. She reports that she does not use drugs.   Family History: The patient's family history is not on file.   ROS:  Please see the history of present illness. Otherwise, complete review of systems is positive for stable arthritic stiffness.  All other systems are reviewed and negative.   Physical Exam: VS:  BP 104/68   Pulse 63   Ht 5\' 8"  (1.727 m)   Wt 246 lb (111.6 kg)   SpO2 97%   BMI 37.40 kg/m , BMI Body mass index is 37.4 kg/m.  Wt Readings from Last 3 Encounters:  10/12/17 246 lb (111.6 kg)  08/15/17 239 lb (108.4 kg)  06/07/17 234 lb (106.1 kg)    General: Patient appears comfortable at rest. HEENT: Conjunctiva and lids normal, oropharynx clear. Neck: Supple, no elevated JVP or carotid bruits, no thyromegaly. Lungs: Clear to auscultation, nonlabored breathing at rest. Cardiac: Regular rate and rhythm, no S3, 3/6 systolic murmur, no pericardial rub. Abdomen: Soft, nontender, bowel sounds present, no guarding or rebound. Extremities: No pitting edema, distal pulses 2+. Skin: Warm and dry. Musculoskeletal: No kyphosis. Neuropsychiatric: Alert and oriented x3, affect grossly appropriate.  ECG: I personally reviewed the tracing from 06/07/2017 which showed sinus rhythm with leftward axis.  Recent Labwork: 06/07/2017: ALT  16; AST 34; BUN 39; Creatinine, Ser 1.50; Hemoglobin 10.5; Platelets 198; Potassium 4.1; Sodium 138     Component Value Date/Time   CHOL 164 12/22/2016 1016   TRIG 134 12/22/2016 1016   HDL 36 (L) 12/22/2016 1016   CHOLHDL 4.6 (H) 12/22/2016 1016   LDLCALC 101 (H) 12/22/2016 1016    Other Studies Reviewed Today:  Echocardiogram 09/06/2017: Study Conclusions  - Left ventricle: The cavity size was normal. Wall thickness was   increased in a pattern of moderate LVH. Systolic function was   normal. The estimated ejection fraction was in the range of 60%   to 65%. Wall motion was normal; there were no regional wall   motion abnormalities. Doppler parameters are consistent with   abnormal left ventricular relaxation (grade 1 diastolic   dysfunction). Doppler parameters are  consistent with high   ventricular filling pressure. - Aortic valve: Severely calcified annulus. Trileaflet; severely   thickened leaflets. There was severe stenosis. There was mild   regurgitation. Mean gradient (S): 44 mm Hg. Valve area (VTI):   0.89 cm^2. Valve area (Vmax): 0.87 cm^2. Valve area (Vmean): 0.83   cm^2. - Mitral valve: Severely calcified annulus. Moderately thickened   leaflets . The findings are consistent with mild stenosis. Valve   area by continuity equation (using LVOT flow): 1.71 cm^2. - Left atrium: The atrium was mildly dilated. - Technically difficult study.  Assessment and Plan:  1.  Severe aortic stenosis with increased gradients in comparison to previous study from December 2018.  She does not report angina or necessarily worsening dyspnea on exertion with low level activities, but does have fatigue at the end of the day and has to pace herself.  We have discussed arranging a right and left heart catheterization as an outpatient for further evaluation and she plans to discuss this with her husband first.  Anticipate getting the procedure scheduled within the next few weeks.  2.   Essential hypertension, blood pressure well controlled today on lisinopril.  3.  Mixed hyperlipidemia, on Pravachol.  Last LDL 101.  She follows with Dr. Wolfgang Phoenix.  4.  CKD stage 3, creatinine 1.5 in March.  Current medicines were reviewed with the patient today.  Disposition: Patient plans to call back with timing for scheduling of outpatient cardiac catheterization.  Plan to see her back after the procedure.  Signed, Satira Sark, MD, [facility] 10/12/2017 9:37 AM    [facility] at [place], [place], [place] [address] Phone: [phone]; Fax: [phone]

## 2017-10-10 NOTE — Progress Notes (Signed)
Cardiology Office Note  Date: 10/12/2017   ID: Devita, Nies 03/25/1950, MRN [id number]  PCP: Kathyrn Drown, MD  Primary Cardiologist: Rozann Lesches, MD   Chief Complaint  Patient presents with  . Aortic Stenosis    History of Present Illness: Sabrina Deleon is a 67 y.o. female last seen in November 2018.  She is here today for a routine visit.  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  She reports NYHA class II dyspnea with most activities, does get fatigued by the end of the day and has to pace herself during the day.  She does not report active angina, has had no palpitations or syncope.  Follow-up echocardiogram in June revealed moderate LVH with LVEF 60 to 65%, severe aortic stenosis with mean gradient 44 mmHg.  Gradient has increased in comparison to December 2018.  I went over the results with her today and discussed the implications.  She continues on medical therapy as outlined below.  Today we discussed outpatient right and left heart catheterization to further evaluate her aortic stenosis and help time surgical management.  She would like to talk to her husband and then make plans from there.  Past Medical History:  Diagnosis Date  . Aortic stenosis   . Arthritis   . Chronic kidney disease    Dr. Tami Ribas- noted increase kidney function levels- took pt off Metformin and started on Onglyza for Blood sugar control-pt being followed for this.  Marland Kitchen GERD (gastroesophageal reflux disease)   . Gout   . Hypertension   . Hypothyroidism   . Pancreatitis   . Type 2 diabetes mellitus ([place])   . Wears glasses   . Wears partial dentures    Upper    Past Surgical History:  Procedure Laterality Date  . BACK SURGERY     Lumbar  . CARPAL TUNNEL RELEASE  11/02/2011   Procedure: CARPAL TUNNEL RELEASE;  Surgeon: Tennis Must, MD;  Location: [facility];  Service: Orthopedics;  Laterality: Right;  . CARPAL  TUNNEL RELEASE Left 01/14/2015   Procedure: LEFT CARPAL TUNNEL RELEASE;  Surgeon: Leanora Cover, MD;  Location: [facility];  Service: Orthopedics;  Laterality: Left;  . CHOLECYSTECTOMY     laparoscopic  . COLONOSCOPY    . COLONOSCOPY N/A 11/12/2015   Procedure: COLONOSCOPY;  Surgeon: Rogene Houston, MD;  [facility];  Service: Endoscopy;  Laterality: N/A;  9:30  . ESOPHAGOGASTRODUODENOSCOPY N/A 11/12/2015   Procedure: ESOPHAGOGASTRODUODENOSCOPY (EGD);  Surgeon: Rogene Houston, MD;  [facility];  Service: Endoscopy;  Laterality: N/A;  . TOTAL HIP ARTHROPLASTY Left 03/22/2016   Procedure: LEFT TOTAL HIP ARTHROPLASTY ANTERIOR APPROACH;  Surgeon: Gaynelle Arabian, MD;  [facility];  Service: Orthopedics;  Laterality: Left;    Current Outpatient Medications  Medication Sig Dispense Refill  . allopurinol (ZYLOPRIM) 100 MG tablet TAKE 1 TABLET BY MOUTH TWICE A DAY 180 tablet 2  . citalopram (CELEXA) 20 MG tablet TAKE 1 AND 1/2 TABLETS BY MOUTH ONCE DAILY 135 tablet 1  . colchicine 0.6 MG tablet Take 0.6 mg by mouth as needed.    . ferrous sulfate 325 (65 FE) MG tablet Take 325 mg by mouth daily with breakfast.    . furosemide (LASIX) 40 MG tablet TAKE 1 TABLET BY MOUTH ONCE DAILY 90 tablet 0  . glipiZIDE (GLUCOTROL) 5 MG tablet TAKE 1/2 TABLET BY MOUTH TWICE A DAY  90 tablet 0  . glucose blood test strip 1 each by Other route as needed for other. Use as instructed    . HYDROcodone-acetaminophen (NORCO/VICODIN) 5-325 MG tablet Take 1 tablet by mouth 3 (three) times daily as needed. 45 tablet 0  . ketoconazole (NIZORAL) 2 % cream Apply 1 application topically as needed for irritation. 15 g 0  . levothyroxine (SYNTHROID, LEVOTHROID) 125 MCG tablet TAKE 1 TABLET BY MOUTH ONCE DAILY 90 tablet 1  . lisinopril (PRINIVIL,ZESTRIL) 20 MG tablet TAKE 1 TABLET ONCE DAILY 90 tablet 1  . ondansetron (ZOFRAN ODT) 4 MG disintegrating tablet 4mg  ODT q4 hours prn nausea/vomit 12  tablet 0  . pantoprazole (PROTONIX) 40 MG tablet TAKE 1 TABLET BY MOUTH ONCE DAILY 150 tablet 0  . pravastatin (PRAVACHOL) 40 MG tablet TAKE 1 TABLET BY MOUTH EVERY DAY 90 tablet 1  . sucralfate (CARAFATE) 1 g tablet Take 1 tablet by mouth three times a day for 14 days. 42 tablet 0  . zolpidem (AMBIEN) 10 MG tablet TAKE 1 TABLET BY MOUTH AT BEDTIME 30 tablet 2   No current facility-administered medications for this visit.    Allergies:  Patient has no known allergies.   Social History: The patient  reports that she quit smoking about 35 years ago. Her smoking use included cigarettes. She has never used smokeless tobacco. She reports that she drinks alcohol. She reports that she does not use drugs.   Family History: The patient's family history is not on file.   ROS:  Please see the history of present illness. Otherwise, complete review of systems is positive for stable arthritic stiffness.  All other systems are reviewed and negative.   Physical Exam: VS:  BP 104/68   Pulse 63   Ht 5\' 8"  (1.727 m)   Wt 246 lb (111.6 kg)   SpO2 97%   BMI 37.40 kg/m , BMI Body mass index is 37.4 kg/m.  Wt Readings from Last 3 Encounters:  10/12/17 246 lb (111.6 kg)  08/15/17 239 lb (108.4 kg)  06/07/17 234 lb (106.1 kg)    General: Patient appears comfortable at rest. HEENT: Conjunctiva and lids normal, oropharynx clear. Neck: Supple, no elevated JVP or carotid bruits, no thyromegaly. Lungs: Clear to auscultation, nonlabored breathing at rest. Cardiac: Regular rate and rhythm, no S3, 3/6 systolic murmur, no pericardial rub. Abdomen: Soft, nontender, bowel sounds present, no guarding or rebound. Extremities: No pitting edema, distal pulses 2+. Skin: Warm and dry. Musculoskeletal: No kyphosis. Neuropsychiatric: Alert and oriented x3, affect grossly appropriate.  ECG: I personally reviewed the tracing from 06/07/2017 which showed sinus rhythm with leftward axis.  Recent Labwork: 06/07/2017: ALT  16; AST 34; BUN 39; Creatinine, Ser 1.50; Hemoglobin 10.5; Platelets 198; Potassium 4.1; Sodium 138     Component Value Date/Time   CHOL 164 12/22/2016 1016   TRIG 134 12/22/2016 1016   HDL 36 (L) 12/22/2016 1016   CHOLHDL 4.6 (H) 12/22/2016 1016   LDLCALC 101 (H) 12/22/2016 1016    Other Studies Reviewed Today:  Echocardiogram 09/06/2017: Study Conclusions  - Left ventricle: The cavity size was normal. Wall thickness was   increased in a pattern of moderate LVH. Systolic function was   normal. The estimated ejection fraction was in the range of 60%   to 65%. Wall motion was normal; there were no regional wall   motion abnormalities. Doppler parameters are consistent with   abnormal left ventricular relaxation (grade 1 diastolic   dysfunction). Doppler parameters are  consistent with high   ventricular filling pressure. - Aortic valve: Severely calcified annulus. Trileaflet; severely   thickened leaflets. There was severe stenosis. There was mild   regurgitation. Mean gradient (S): 44 mm Hg. Valve area (VTI):   0.89 cm^2. Valve area (Vmax): 0.87 cm^2. Valve area (Vmean): 0.83   cm^2. - Mitral valve: Severely calcified annulus. Moderately thickened   leaflets . The findings are consistent with mild stenosis. Valve   area by continuity equation (using LVOT flow): 1.71 cm^2. - Left atrium: The atrium was mildly dilated. - Technically difficult study.  Assessment and Plan:  1.  Severe aortic stenosis with increased gradients in comparison to previous study from December 2018.  She does not report angina or necessarily worsening dyspnea on exertion with low level activities, but does have fatigue at the end of the day and has to pace herself.  We have discussed arranging a right and left heart catheterization as an outpatient for further evaluation and she plans to discuss this with her husband first.  Anticipate getting the procedure scheduled within the next few weeks.  2.   Essential hypertension, blood pressure well controlled today on lisinopril.  3.  Mixed hyperlipidemia, on Pravachol.  Last LDL 101.  She follows with Dr. Wolfgang Phoenix.  4.  CKD stage 3, creatinine 1.5 in March.  Current medicines were reviewed with the patient today.  Disposition: Patient plans to call back with timing for scheduling of outpatient cardiac catheterization.  Plan to see her back after the procedure.  Signed, Satira Sark, MD, [facility] 10/12/2017 9:37 AM    [facility] at [place], [place], [place] [address] Phone: [phone]; Fax: [phone]

## 2017-10-12 ENCOUNTER — Encounter: Payer: Self-pay | Admitting: Nurse Practitioner

## 2017-10-12 ENCOUNTER — Ambulatory Visit: Payer: Medicare Other | Admitting: Cardiology

## 2017-10-12 ENCOUNTER — Other Ambulatory Visit: Payer: Self-pay

## 2017-10-12 ENCOUNTER — Ambulatory Visit (INDEPENDENT_AMBULATORY_CARE_PROVIDER_SITE_OTHER): Payer: Medicare Other | Admitting: Nurse Practitioner

## 2017-10-12 ENCOUNTER — Encounter: Payer: Self-pay | Admitting: Cardiology

## 2017-10-12 VITALS — BP 104/68 | HR 63 | Ht 68.0 in | Wt 246.0 lb

## 2017-10-12 VITALS — BP 128/74 | Ht 68.0 in | Wt 245.4 lb

## 2017-10-12 DIAGNOSIS — B372 Candidiasis of skin and nail: Secondary | ICD-10-CM

## 2017-10-12 DIAGNOSIS — D509 Iron deficiency anemia, unspecified: Secondary | ICD-10-CM | POA: Diagnosis not present

## 2017-10-12 DIAGNOSIS — N183 Chronic kidney disease, stage 3 unspecified: Secondary | ICD-10-CM

## 2017-10-12 DIAGNOSIS — E782 Mixed hyperlipidemia: Secondary | ICD-10-CM

## 2017-10-12 DIAGNOSIS — I10 Essential (primary) hypertension: Secondary | ICD-10-CM

## 2017-10-12 DIAGNOSIS — I350 Nonrheumatic aortic (valve) stenosis: Secondary | ICD-10-CM | POA: Diagnosis not present

## 2017-10-12 DIAGNOSIS — L304 Erythema intertrigo: Secondary | ICD-10-CM | POA: Diagnosis not present

## 2017-10-12 MED ORDER — HYDROXYZINE HCL 25 MG PO TABS: ORAL_TABLET | ORAL | 2 refills | Status: DC

## 2017-10-12 MED ORDER — KETOCONAZOLE 2 % EX CREA: 1.0000 "application " | TOPICAL_CREAM | CUTANEOUS | 0 refills | Status: DC | PRN

## 2017-10-12 MED ORDER — FLUCONAZOLE 100 MG PO TABS
ORAL_TABLET | ORAL | 0 refills | Status: DC
Start: 2017-10-12 — End: 2017-12-13

## 2017-10-12 MED ORDER — CLOBETASOL PROPIONATE 0.05 % EX CREA: 1.0000 "application " | TOPICAL_CREAM | Freq: Two times a day (BID) | CUTANEOUS | 0 refills | Status: DC

## 2017-10-12 NOTE — Patient Instructions (Addendum)
Medication Instructions:   Your physician recommends that you continue on your current medications as directed. Please refer to the Current Medication list given to you today.  Labwork:  Pending heart cath  Testing/Procedures:  Pending heart cath  Follow-Up:  Your physician recommends that you schedule a follow-up appointment in: pending heart cath.  Any Other Special Instructions Will Be Listed Below (If Applicable).  Please contact our office with your decision about having a right and left heart cath.  If you need a refill on your cardiac medications before your next appointment, please call your pharmacy.

## 2017-10-12 NOTE — Progress Notes (Signed)
Subjective: Presents for complaints of a recurrent now chronic rash that began over 2 years ago.  Much worse lately.  Describes as itching and burning along the upper back thigh area into the rectal and private area externally.  Some calming with Bactroban and barrier cream but does not resolve.  No fever.  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  Denies any trouble swallowing or chronic sore throat.  Defers referral at this point since she has some serious cardiac issues that is being addressed at this time.  Patient to contact her office if or when she is ready for an ENT referral.  Has some difficulty with her reflux, has had an endoscopy, her reflux has been followed by gastroenterology.  Has a history of chronic anemia, has noticed some restless leg syndrome particularly at night.  Continues to have difficulty sleeping, could not get her Lunesta approved by insurance and Ambien does not seem to work as well.   Objective:   BP 128/74   Ht 5\' 8"  (1.727 m)   Wt 245 lb 6.4 oz (111.3 kg)   BMI 37.31 kg/m  NAD.  Alert, oriented.  Lungs clear.  Heart regular rate and rhythm.  Confluent dry hyperpigmented/pink slightly raised rash noted covering most of the posterior upper thigh area.  This extends into the external GU and rectal area externally.  Assessment:   Problem List Items Addressed This Visit      Other   Iron deficiency anemia   Relevant Orders   Ferritin (Completed)    Other Visit Diagnoses    Yeast dermatitis    -  Primary   Relevant Medications   fluconazole (DIFLUCAN) 100 MG tablet   ketoconazole (NIZORAL) 2 % cream   Eczema intertrigo           Plan:   Meds ordered this encounter  Medications  . fluconazole (DIFLUCAN) 100 MG tablet    Sig: Take 2 po today then one po qd x 14 d; do not take with colchicine    Dispense:  16 tablet    Refill:  0    Order Specific Question:   Supervising  Provider    Answer:   Mikey Kirschner [[id number]]  . clobetasol cream (TEMOVATE) 0.05 %    Sig: Apply 1 application topically 2 (two) times daily. Prn up to 2 weeks    Dispense:  60 g    Refill:  0    Order Specific Question:   Supervising Provider    Answer:   Mikey Kirschner [[id number]]  . ketoconazole (NIZORAL) 2 % cream    Sig: Apply 1 application topically as needed for irritation.    Dispense:  30 g    Refill:  0    Order Specific Question:   Supervising Provider    Answer:   Mikey Kirschner [[id number]]  . hydrOXYzine (ATARAX/VISTARIL) 25 MG tablet    Sig: Take one po QHS prn itching or sleep    Dispense:  30 tablet    Refill:  2    Order Specific Question:   Supervising Provider    Answer:   Mikey Kirschner [[id number]]    Due to the extensiveness of her rash, start oral Diflucan and apply clobetasol with ketoconazole together twice a day.  Trial of hydroxyzine to help both sleep and itching from her rash.  Call back if symptoms worsen or persist.  Ferritin level pending, if this is low, will address at that time.  Recheck if restless leg syndrome persists. 25 minutes was spent with the patient.  This statement verifies that 25 minutes was indeed spent with the patient.  More than 50% of this visit-total duration of the visit-was spent in counseling and coordination of care. The issues that the patient came in for today as reflected in the diagnosis (s) please refer to documentation for further details.

## 2017-10-13 ENCOUNTER — Encounter: Payer: Self-pay | Admitting: Nurse Practitioner

## 2017-10-13 DIAGNOSIS — D509 Iron deficiency anemia, unspecified: Secondary | ICD-10-CM | POA: Insufficient documentation

## 2017-10-13 LAB — FERRITIN: Ferritin: 250 ng/mL — ABNORMAL HIGH (ref 15–150)

## 2017-10-15 ENCOUNTER — Other Ambulatory Visit: Payer: Self-pay | Admitting: Cardiology

## 2017-10-15 ENCOUNTER — Telehealth: Payer: Self-pay | Admitting: Cardiology

## 2017-10-15 ENCOUNTER — Encounter: Payer: Self-pay | Admitting: *Deleted

## 2017-10-15 ENCOUNTER — Telehealth: Payer: Self-pay | Admitting: *Deleted

## 2017-10-15 ENCOUNTER — Other Ambulatory Visit (HOSPITAL_COMMUNITY)
Admission: RE | Admit: 2017-10-15 | Discharge: 2017-10-15 | Disposition: A | Discharge status: Home / Self Care | Payer: Medicare Other | Source: Ambulatory Visit | Attending: Cardiology | Admitting: Cardiology

## 2017-10-15 DIAGNOSIS — I350 Nonrheumatic aortic (valve) stenosis: Secondary | ICD-10-CM

## 2017-10-15 DIAGNOSIS — Z01810 Encounter for preprocedural cardiovascular examination: Secondary | ICD-10-CM

## 2017-10-15 LAB — CBC
HCT: 33 % — ABNORMAL LOW (ref 36.0–46.0)
Hemoglobin: 10.5 g/dL — ABNORMAL LOW (ref 12.0–15.0)
MCH: 29.5 pg (ref 26.0–34.0)
MCHC: 31.8 g/dL (ref 30.0–36.0)
MCV: 92.7 fL (ref 78.0–100.0)
PLATELETS: 196 10*3/uL (ref 150–400)
RBC: 3.56 MIL/uL — ABNORMAL LOW (ref 3.87–5.11)
RDW: 14.2 % (ref 11.5–15.5)
WBC: 8.2 10*3/uL (ref 4.0–10.5)

## 2017-10-15 LAB — BASIC METABOLIC PANEL
Anion gap: 5 (ref 5–15)
BUN: 43 mg/dL — AB (ref 8–23)
CO2: 28 mmol/L (ref 22–32)
CREATININE: 1.86 mg/dL — AB (ref 0.44–1.00)
Calcium: 9.3 mg/dL (ref 8.9–10.3)
Chloride: 104 mmol/L (ref 98–111)
GFR calc Af Amer: 31 mL/min — ABNORMAL LOW (ref >60)
GFR, EST NON AFRICAN AMERICAN: 27 mL/min — AB (ref >60)
GLUCOSE: 108 mg/dL — AB (ref 70–99)
Potassium: 4.7 mmol/L (ref 3.5–5.1)
SODIUM: 137 mmol/L (ref 135–145)

## 2017-10-15 NOTE — Telephone Encounter (Signed)
Instructions read to patient. Patient will come to office today to pick up lab orders and instructions.

## 2017-10-15 NOTE — Telephone Encounter (Signed)
right and left heart cath dx: severe aortic stenosis scheduled for Thursday, October 18, 2017 @12 :00 with Dr. Angelena Form

## 2017-10-15 NOTE — Telephone Encounter (Signed)
 @  [facility] [name] [address] Dept: [phone] Loc: [phone]  KEARAH GAYDEN  10/15/2017  You are scheduled for a Cardiac Catheterization on Thursday, August 1 with Dr. Lauree Chandler.  1. Please arrive at the [facility] (Main Entrance A) at [facility]: [address] [place], [place] [address] at 10:00 AM (This time is two hours before your procedure to ensure your preparation). Free valet parking service is available.   Special note: Every effort is made to have your procedure done on time. Please understand that emergencies sometimes delay scheduled procedures.  2. Diet: Do not eat solid foods after midnight.  The patient may have clear liquids until 5am upon the day of the procedure.  3. Labs: You will need to have blood drawn on Monday, July 29 at [facility]. [facility] for BMET & CBC. You do not need to be fasting.  4. Medication instructions in preparation for your procedure: Please hold your glipizide and furosemide the morning of your procedure.    On the morning of your procedure, take your Aspirin 81 mg and any morning medicines NOT listed above.  You may use sips of water.  5. Plan for one night stay--bring personal belongings. 6. Bring a current list of your medications and current insurance cards. 7. You MUST have a responsible person to drive you home. 8. Someone MUST be with you the first 24 hours after you arrive home or your discharge will be delayed. 9. Please wear clothes that are easy to get on and off and wear slip-on shoes.  Thank you for allowing [place] to care for you!   -- [facility]

## 2017-10-16 ENCOUNTER — Telehealth: Payer: Self-pay | Admitting: *Deleted

## 2017-10-16 NOTE — Telephone Encounter (Signed)
Pt contacted pre-catheterization scheduled at [facility] for: Thursday October 18, 2017 12 noon Verified arrival time and place: [facility] Entrance A at: 7 AM for pre-procedure hydration.  No solid food after midnight prior to cath, clear liquids until 5 AM day of procedure.  Hold: Furosemide day before and day of procedure. Lisinopril day before and day of procedure. Glipizide AM of procedure.  Except hold medications AM meds can be  taken pre-cath with sip of water including: ASA 81 mg  Confirmed patient has responsible person to drive home post procedure and for 24 hours after you arrive home: yes

## 2017-10-16 NOTE — Telephone Encounter (Signed)
Patient informed. 

## 2017-10-16 NOTE — Telephone Encounter (Signed)
-----   Message from Satira Sark, MD sent at 10/15/2017 12:26 PM EDT ----- Results reviewed.  Anemia is stable.  Creatinine 1.8 with normal potassium.  Should still be able to proceed with coronary angiography with limited contrast dye. A copy of this test should be forwarded to Kathyrn Drown, MD.

## 2017-10-17 ENCOUNTER — Ambulatory Visit: Payer: Medicare Other | Admitting: Nurse Practitioner

## 2017-10-18 ENCOUNTER — Encounter (HOSPITAL_COMMUNITY)
Admission: RE | Disposition: A | Discharge status: Home / Self Care | Payer: Self-pay | Source: Ambulatory Visit | Attending: Cardiovascular Disease

## 2017-10-18 ENCOUNTER — Ambulatory Visit (HOSPITAL_COMMUNITY)
Admission: RE | Admit: 2017-10-18 | Discharge: 2017-10-18 | Disposition: A | Discharge status: Home / Self Care | Payer: Medicare Other | Source: Ambulatory Visit | Attending: Cardiovascular Disease | Admitting: Cardiovascular Disease

## 2017-10-18 DIAGNOSIS — N183 Chronic kidney disease, stage 3 (moderate): Secondary | ICD-10-CM | POA: Insufficient documentation

## 2017-10-18 DIAGNOSIS — E1122 Type 2 diabetes mellitus with diabetic chronic kidney disease: Secondary | ICD-10-CM | POA: Insufficient documentation

## 2017-10-18 DIAGNOSIS — I2510 Atherosclerotic heart disease of native coronary artery without angina pectoris: Secondary | ICD-10-CM | POA: Insufficient documentation

## 2017-10-18 DIAGNOSIS — E782 Mixed hyperlipidemia: Secondary | ICD-10-CM | POA: Insufficient documentation

## 2017-10-18 DIAGNOSIS — Z87891 Personal history of nicotine dependence: Secondary | ICD-10-CM | POA: Insufficient documentation

## 2017-10-18 DIAGNOSIS — E039 Hypothyroidism, unspecified: Secondary | ICD-10-CM | POA: Insufficient documentation

## 2017-10-18 DIAGNOSIS — Z7984 Long term (current) use of oral hypoglycemic drugs: Secondary | ICD-10-CM | POA: Diagnosis not present

## 2017-10-18 DIAGNOSIS — I350 Nonrheumatic aortic (valve) stenosis: Secondary | ICD-10-CM

## 2017-10-18 DIAGNOSIS — I129 Hypertensive chronic kidney disease with stage 1 through stage 4 chronic kidney disease, or unspecified chronic kidney disease: Secondary | ICD-10-CM | POA: Diagnosis not present

## 2017-10-18 DIAGNOSIS — M109 Gout, unspecified: Secondary | ICD-10-CM | POA: Insufficient documentation

## 2017-10-18 DIAGNOSIS — K219 Gastro-esophageal reflux disease without esophagitis: Secondary | ICD-10-CM | POA: Insufficient documentation

## 2017-10-18 DIAGNOSIS — M1990 Unspecified osteoarthritis, unspecified site: Secondary | ICD-10-CM | POA: Insufficient documentation

## 2017-10-18 HISTORY — PX: RIGHT/LEFT HEART CATH AND CORONARY ANGIOGRAPHY: CATH118266

## 2017-10-18 LAB — POCT I-STAT 3, ART BLOOD GAS (G3+)
Acid-base deficit: 1 mmol/L (ref 0.0–2.0)
BICARBONATE: 23.6 mmol/L (ref 20.0–28.0)
O2 Saturation: 99 %
PH ART: 7.421 (ref 7.350–7.450)
TCO2: 25 mmol/L (ref 22–32)
pCO2 arterial: 36.3 mmHg (ref 32.0–48.0)
pO2, Arterial: 141 mmHg — ABNORMAL HIGH (ref 83.0–108.0)

## 2017-10-18 LAB — POCT I-STAT 3, VENOUS BLOOD GAS (G3P V)
Acid-base deficit: 1 mmol/L (ref 0.0–2.0)
Bicarbonate: 24.4 mmol/L (ref 20.0–28.0)
O2 Saturation: 72 %
PCO2 VEN: 42 mmHg — AB (ref 44.0–60.0)
PO2 VEN: 39 mmHg (ref 32.0–45.0)
TCO2: 26 mmol/L (ref 22–32)
pH, Ven: 7.372 (ref 7.250–7.430)

## 2017-10-18 LAB — GLUCOSE, CAPILLARY: Glucose-Capillary: 106 mg/dL — ABNORMAL HIGH (ref 70–99)

## 2017-10-18 SURGERY — RIGHT/LEFT HEART CATH AND CORONARY ANGIOGRAPHY
Anesthesia: LOCAL

## 2017-10-18 MED ORDER — HEPARIN SODIUM (PORCINE) 1000 UNIT/ML IJ SOLN
INTRAMUSCULAR | Status: AC
  Filled 2017-10-18: qty 1

## 2017-10-18 MED ORDER — FENTANYL CITRATE (PF) 100 MCG/2ML IJ SOLN
INTRAMUSCULAR | Status: DC | PRN
  Administered 2017-10-18 (×2): 25 ug via INTRAVENOUS

## 2017-10-18 MED ORDER — SODIUM CHLORIDE 0.9% FLUSH: 3.0000 mL | Freq: Two times a day (BID) | INTRAVENOUS | Status: DC

## 2017-10-18 MED ORDER — VERAPAMIL HCL 2.5 MG/ML IV SOLN
INTRAVENOUS | Status: AC
  Filled 2017-10-18: qty 2

## 2017-10-18 MED ORDER — SODIUM CHLORIDE 0.9 % IV SOLN: 250.0000 mL | INTRAVENOUS | Status: DC | PRN

## 2017-10-18 MED ORDER — LIDOCAINE HCL (PF) 1 % IJ SOLN
INTRAMUSCULAR | Status: DC | PRN
  Administered 2017-10-18 (×2): 2 mL via INTRADERMAL

## 2017-10-18 MED ORDER — ASPIRIN 81 MG PO CHEW: 81.0000 mg | CHEWABLE_TABLET | ORAL | Status: DC

## 2017-10-18 MED ORDER — MIDAZOLAM HCL 2 MG/2ML IJ SOLN
INTRAMUSCULAR | Status: DC | PRN
  Administered 2017-10-18 (×2): 1 mg via INTRAVENOUS

## 2017-10-18 MED ORDER — ACETAMINOPHEN 325 MG PO TABS: 650.0000 mg | ORAL_TABLET | ORAL | Status: DC | PRN

## 2017-10-18 MED ORDER — HEPARIN SODIUM (PORCINE) 1000 UNIT/ML IJ SOLN
INTRAMUSCULAR | Status: DC | PRN
  Administered 2017-10-18: 5500 [IU] via INTRAVENOUS

## 2017-10-18 MED ORDER — IOPAMIDOL (ISOVUE-370) INJECTION 76%
INTRAVENOUS | Status: AC
  Filled 2017-10-18: qty 100

## 2017-10-18 MED ORDER — SODIUM CHLORIDE 0.9% FLUSH: 3.0000 mL | INTRAVENOUS | Status: DC | PRN

## 2017-10-18 MED ORDER — FENTANYL CITRATE (PF) 100 MCG/2ML IJ SOLN
INTRAMUSCULAR | Status: AC
  Filled 2017-10-18: qty 2

## 2017-10-18 MED ORDER — HEPARIN (PORCINE) IN NACL 1000-0.9 UT/500ML-% IV SOLN
INTRAVENOUS | Status: DC | PRN
  Administered 2017-10-18 (×2): 500 mL

## 2017-10-18 MED ORDER — MIDAZOLAM HCL 2 MG/2ML IJ SOLN
INTRAMUSCULAR | Status: AC
  Filled 2017-10-18: qty 2

## 2017-10-18 MED ORDER — HEPARIN (PORCINE) IN NACL 2-0.9 UNITS/ML
INTRAMUSCULAR | Status: DC | PRN
  Administered 2017-10-18: 13:00:00 via INTRA_ARTERIAL

## 2017-10-18 MED ORDER — ONDANSETRON HCL 4 MG/2ML IJ SOLN
4.0000 mg | Freq: Four times a day (QID) | INTRAMUSCULAR | Status: DC | PRN
Start: 2017-10-18 — End: 2017-10-18

## 2017-10-18 MED ORDER — IOPAMIDOL (ISOVUE-370) INJECTION 76%
INTRAVENOUS | Status: DC | PRN
  Administered 2017-10-18: 90 mL via INTRA_ARTERIAL

## 2017-10-18 MED ORDER — HEPARIN (PORCINE) IN NACL 1000-0.9 UT/500ML-% IV SOLN
INTRAVENOUS | Status: AC
  Filled 2017-10-18: qty 1000

## 2017-10-18 MED ORDER — SODIUM CHLORIDE 0.9 % IV SOLN: INTRAVENOUS | Status: DC

## 2017-10-18 MED ORDER — LIDOCAINE HCL (PF) 1 % IJ SOLN
INTRAMUSCULAR | Status: AC
  Filled 2017-10-18: qty 30

## 2017-10-18 MED ORDER — SODIUM CHLORIDE 0.9 % IV SOLN
INTRAVENOUS | Status: DC
  Administered 2017-10-18: 08:00:00 via INTRAVENOUS

## 2017-10-18 SURGICAL SUPPLY — 15 items
CATH 5FR JL3.5 JR4 ANG PIG MP (CATHETERS) ×1 IMPLANT
CATH BALLN WEDGE 5F 110CM (CATHETERS) ×1 IMPLANT
CATH INFINITI 5 FR AL2 (CATHETERS) ×1 IMPLANT
CATH INFINITI 5FR AL1 (CATHETERS) ×1 IMPLANT
DEVICE RAD COMP TR BAND LRG (VASCULAR PRODUCTS) ×1 IMPLANT
GLIDESHEATH SLEND SS 6F .021 (SHEATH) ×1 IMPLANT
GUIDEWIRE INQWIRE 1.5J.035X260 (WIRE) IMPLANT
INQWIRE 1.5J .035X260CM (WIRE) ×2
KIT HEART LEFT (KITS) ×2 IMPLANT
PACK CARDIAC CATHETERIZATION (CUSTOM PROCEDURE TRAY) ×2 IMPLANT
SHEATH GLIDE SLENDER 4/5FR (SHEATH) ×1 IMPLANT
TRANSDUCER W/STOPCOCK (MISCELLANEOUS) ×2 IMPLANT
TUBING CIL FLEX 10 FLL-RA (TUBING) ×2 IMPLANT
WIRE EMERALD ST .035X150CM (WIRE) ×1 IMPLANT
WIRE MICROINTRODUCER 60CM (WIRE) ×1 IMPLANT

## 2017-10-18 NOTE — Interval H&P Note (Signed)
History and Physical Interval Note:  10/18/2017 11:06 AM  Senetra R Haver  has presented today for cardiac cath with the diagnosis of severe AS. The various methods of treatment have been discussed with the patient and family. After consideration of risks, benefits and other options for treatment, the patient has consented to  Procedure(s): RIGHT/LEFT HEART CATH AND CORONARY ANGIOGRAPHY (N/A) as a surgical intervention .  The patient's history has been reviewed, patient examined, no change in status, stable for surgery.  I have reviewed the patient's chart and labs.  Questions were answered to the patient's satisfaction.    Cath Lab Visit (complete for each Cath Lab visit)  Clinical Evaluation Leading to the Procedure:   ACS: No.  Non-ACS:    Anginal Classification: CCS II  Anti-ischemic medical therapy: No Therapy  Non-Invasive Test Results: No non-invasive testing performed  Prior CABG: No previous CABG         Lauree Chandler

## 2017-10-18 NOTE — Discharge Instructions (Signed)
° °  DRINK PLENTY OF FLUIDS FOR THE NEXT 2-3 DAYS TO KEEP HYDRATED. ° °Radial Site Care °Refer to this sheet in the next few weeks. These instructions provide you with information about caring for yourself after your procedure. Your health care provider may also give you more specific instructions. Your treatment has been planned according to current medical practices, but problems sometimes occur. Call your health care provider if you have any problems or questions after your procedure. °What can I expect after the procedure? °After your procedure, it is typical to have the following: °· Bruising at the radial site that usually fades within 1-2 weeks. °· Blood collecting in the tissue (hematoma) that may be painful to the touch. It should usually decrease in size and tenderness within 1-2 weeks. ° °Follow these instructions at home: °· Take medicines only as directed by your health care provider. °· You may shower 24-48 hours after the procedure or as directed by your health care provider. Remove the bandage (dressing) and gently wash the site with plain soap and water. Pat the area dry with a clean towel. Do not rub the site, because this may cause bleeding. °· Do not take baths, swim, or use a hot tub until your health care provider approves. °· Check your insertion site every day for redness, swelling, or drainage. °· Do not apply powder or lotion to the site. °· Do not flex or bend the affected arm for 24 hours or as directed by your health care provider. °· Do not push or pull heavy objects with the affected arm for 24 hours or as directed by your health care provider. °· Do not lift over 10 lb (4.5 kg) for 5 days after your procedure or as directed by your health care provider. °· Ask your health care provider when it is okay to: °? Return to work or school. °? Resume usual physical activities or sports. °? Resume sexual activity. °· Do not drive home if you are discharged the same day as the procedure. Have  someone else drive you. °· You may drive 24 hours after the procedure unless otherwise instructed by your health care provider. °· Do not operate machinery or power tools for 24 hours after the procedure. °· If your procedure was done as an outpatient procedure, which means that you went home the same day as your procedure, a responsible adult should be with you for the first 24 hours after you arrive home. °· Keep all follow-up visits as directed by your health care provider. This is important. °Contact a health care provider if: °· You have a fever. °· You have chills. °· You have increased bleeding from the radial site. Hold pressure on the site. °Get help right away if: °· You have unusual pain at the radial site. °· You have redness, warmth, or swelling at the radial site. °· You have drainage (other than a small amount of blood on the dressing) from the radial site. °· The radial site is bleeding, and the bleeding does not stop after 30 minutes of holding steady pressure on the site. °· Your arm or hand becomes pale, cool, tingly, or numb. °This information is not intended to replace advice given to you by your health care provider. Make sure you discuss any questions you have with your health care provider. °Document Released: 04/08/2010 Document Revised: 08/12/2015 Document Reviewed: 09/22/2013 °[employer]vier Interactive Patient Education © 2018 [employer] ° °

## 2017-10-19 ENCOUNTER — Encounter (HOSPITAL_COMMUNITY): Payer: Self-pay | Admitting: Cardiovascular Disease

## 2017-10-20 ENCOUNTER — Other Ambulatory Visit: Payer: Self-pay | Admitting: Nurse Practitioner

## 2017-10-24 ENCOUNTER — Encounter: Payer: Medicare Other | Admitting: Surgery

## 2017-10-25 ENCOUNTER — Other Ambulatory Visit: Payer: Self-pay | Admitting: Nurse Practitioner

## 2017-10-26 NOTE — Telephone Encounter (Signed)
May have 4 refills 

## 2017-11-08 ENCOUNTER — Other Ambulatory Visit: Payer: Self-pay | Admitting: Family Medicine

## 2017-11-14 ENCOUNTER — Encounter: Payer: Medicare Other | Admitting: Surgery

## 2017-11-16 ENCOUNTER — Other Ambulatory Visit: Payer: Self-pay

## 2017-11-16 DIAGNOSIS — I350 Nonrheumatic aortic (valve) stenosis: Secondary | ICD-10-CM

## 2017-11-16 DIAGNOSIS — R0609 Other forms of dyspnea: Secondary | ICD-10-CM

## 2017-11-20 ENCOUNTER — Other Ambulatory Visit: Payer: Self-pay

## 2017-11-20 ENCOUNTER — Telehealth: Payer: Self-pay | Admitting: Cardiology

## 2017-11-20 DIAGNOSIS — N289 Disorder of kidney and ureter, unspecified: Secondary | ICD-10-CM

## 2017-11-20 DIAGNOSIS — I350 Nonrheumatic aortic (valve) stenosis: Secondary | ICD-10-CM

## 2017-11-20 NOTE — Telephone Encounter (Signed)
I am certainly happy to see her, but it is really for a quick follow-up of her cardiac catheterization.  We will not have any more details about anticipated AVR until she sees Dr. Cyndia Bent.

## 2017-11-20 NOTE — Telephone Encounter (Signed)
Patient would like to know if she needs to reschedule appointment with Dr Domenic Polite 9/6 since she has not seen Dr Cyndia Bent yet.  He had to reschedule her appointment she had with him

## 2017-11-20 NOTE — Telephone Encounter (Signed)
11/23/2017 office visit is her 1 month post cath visit - will send to provider for clarification.

## 2017-11-20 NOTE — Telephone Encounter (Signed)
I contacted the pt in regards to further cardiac testing prior to evaluation with Dr Cyndia Bent.  The pt inquired about Dr Domenic Polite office visit and I made her aware of his response.  The pt's cath site is "doing fine" and I advised the pt that she did not have to keep this apt.  She asked me to cancel the apt at this time.

## 2017-11-23 ENCOUNTER — Ambulatory Visit: Payer: Medicare Other | Admitting: Cardiology

## 2017-11-26 ENCOUNTER — Telehealth: Payer: Self-pay

## 2017-11-26 NOTE — Telephone Encounter (Signed)
I contacted the pt in regards to her My Chart message.  The pt would like to cancel pending tests on 9/23 and PT eval on 9/26.  The pt has decided that she would like to see Dr Cyndia Bent in consultation before having any further testing performed.  The pt will keep her pending appointment with Dr Cyndia Bent on 9/26 at 4:00 for further evaluation of Aortic Stenosis.  The pt will contact me with any additional questions or concerns.

## 2017-12-05 ENCOUNTER — Other Ambulatory Visit: Payer: Self-pay | Admitting: Family Medicine

## 2017-12-05 ENCOUNTER — Telehealth: Payer: Self-pay

## 2017-12-05 ENCOUNTER — Telehealth: Payer: Self-pay | Admitting: Family Medicine

## 2017-12-05 MED ORDER — HYDROCODONE-ACETAMINOPHEN 5-325 MG PO TABS: 1.0000 | ORAL_TABLET | Freq: Two times a day (BID) | ORAL | 0 refills | Status: DC | PRN

## 2017-12-05 NOTE — Telephone Encounter (Signed)
Please inform patient prescription was sent into her pharmacy please keep follow-up office visit

## 2017-12-05 NOTE — Telephone Encounter (Signed)
Pt.notified

## 2017-12-05 NOTE — Telephone Encounter (Signed)
The drug registry was checked, prescription sent in, keep follow-up appointment

## 2017-12-05 NOTE — Telephone Encounter (Signed)
Patient is requesting a refill on her Hydrocodone has an appt 12/14/2017. Uses [facility] [place].

## 2017-12-10 ENCOUNTER — Ambulatory Visit (HOSPITAL_COMMUNITY): Payer: Medicare Other

## 2017-12-10 ENCOUNTER — Inpatient Hospital Stay (HOSPITAL_COMMUNITY): Admission: RE | Admit: 2017-12-10 | Payer: Medicare Other | Source: Ambulatory Visit

## 2017-12-10 ENCOUNTER — Encounter (HOSPITAL_COMMUNITY): Payer: Medicare Other

## 2017-12-13 ENCOUNTER — Other Ambulatory Visit: Payer: Self-pay

## 2017-12-13 ENCOUNTER — Institutional Professional Consult (permissible substitution): Payer: Medicare Other | Admitting: Surgery

## 2017-12-13 ENCOUNTER — Encounter: Payer: Self-pay | Admitting: Surgery

## 2017-12-13 ENCOUNTER — Ambulatory Visit: Payer: Medicare Other | Admitting: Physical Therapy

## 2017-12-13 VITALS — BP 116/64 | HR 70 | Resp 16 | Ht 68.5 in | Wt 244.0 lb

## 2017-12-13 DIAGNOSIS — I350 Nonrheumatic aortic (valve) stenosis: Secondary | ICD-10-CM | POA: Diagnosis not present

## 2017-12-13 NOTE — Progress Notes (Signed)
Patient ID: Sabrina Deleon, female   DOB: May 03, 1950, 67 y.o.   MRN: [id number]  [facility] CONSULTATION REPORT  Referring Provider is Satira Sark, MD PCP is Kathyrn Drown, MD  Chief Complaint  Patient presents with  . Aortic Stenosis    eval for SAVR vs TAVR...ECHO 09/06/17 CATH 10/18/17    HPI:  The patient is a 67 year old woman with a history of hypertension, hypothyroidism, type 2 diabetes, stage IIIb-IV chronic kidney disease, degenerative arthritis, and known aortic stenosis that has been followed by Dr. Domenic Polite.  She had an echocardiogram on 02/22/2017 which showed a trileaflet aortic valve with severely thickened leaflets with a mean gradient of 39 mmHg and a dimensionless index of 0.28 consistent with moderate to severe aortic stenosis.  Her most recent echo on 09/06/2017 showed an increase in the mean aortic valve gradient to 44 mmHg with a peak gradient of 66 mmHg.  The dimensionless index was 0.31.  Aortic valve area was 0.8 cm.  Left ventricular ejection fraction was 60 to 65% with grade 1 diastolic dysfunction and moderate concentric left ventricular hypertrophy.  The mitral annulus was severely calcified with moderately thickened leaflets with a mean gradient of 4 mmHg and a peak gradient of 8 mmHg consistent with mild mitral stenosis.  She underwent cardiac catheterization on 10/18/2017 showing mild nonobstructive coronary disease.  The mean gradient across aortic valve was 26 mmHg with a peak to peak gradient of 34 mmHg.  The patient is here today with her husband.  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  She is usually up at 5:30 in the morning and goes to work all day and then spends time with her grandchildren running them around to different activities.  She denies any exertional fatigue and only reports minimal shortness of  breath with activity but said that she gets tired at the end of the day.  She does not have to walk up any hills or steps during the day.  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  She denies any chest discomfort, dizziness, or syncope.  She occasionally has some peripheral edema in her ankles.  Past Medical History:  Diagnosis Date  . Aortic stenosis   . Arthritis   . Chronic kidney disease    Dr. Tami Ribas- noted increase kidney function levels- took pt off Metformin and started on Onglyza for Blood sugar control-pt being followed for this.  Marland Kitchen GERD (gastroesophageal reflux disease)   . Gout   . Hypertension   . Hypothyroidism   . Pancreatitis   . Type 2 diabetes mellitus ([place])   . Wears glasses   . Wears partial dentures    Upper    Past Surgical History:  Procedure Laterality Date  . BACK SURGERY     Lumbar  . CARPAL TUNNEL RELEASE  11/02/2011   Procedure: CARPAL TUNNEL RELEASE;  Surgeon: Tennis Must, MD;  Location: [facility];  Service: Orthopedics;  Laterality: Right;  . CARPAL TUNNEL RELEASE Left 01/14/2015   Procedure: LEFT CARPAL TUNNEL RELEASE;  Surgeon: Leanora Cover, MD;  Location: [facility];  Service: Orthopedics;  Laterality: Left;  . CHOLECYSTECTOMY     laparoscopic  . COLONOSCOPY    . COLONOSCOPY N/A 11/12/2015   Procedure: COLONOSCOPY;  Surgeon: Rogene Houston, MD;  [facility];  Service: Endoscopy;  Laterality: N/A;  9:30  . ESOPHAGOGASTRODUODENOSCOPY N/A 11/12/2015   Procedure: ESOPHAGOGASTRODUODENOSCOPY (EGD);  Surgeon: Rogene Houston, MD;  [facility];  Service: Endoscopy;  Laterality: N/A;  . RIGHT/LEFT HEART CATH AND CORONARY ANGIOGRAPHY N/A 10/18/2017   Procedure: RIGHT/LEFT HEART CATH AND CORONARY ANGIOGRAPHY;  Surgeon: Burnell Blanks, MD;  Location: [facility];  Service: Cardiovascular;  Laterality: N/A;  . TOTAL HIP ARTHROPLASTY Left 03/22/2016   Procedure: LEFT TOTAL HIP  ARTHROPLASTY ANTERIOR APPROACH;  Surgeon: Gaynelle Arabian, MD;  Location: [facility];  Service: Orthopedics;  Laterality: Left;    History reviewed. No pertinent family history.  Social History   Socioeconomic History  . Marital status: Married    Spouse name: Not on file  . Number of children: Not on file  . Years of education: Not on file  . Highest education level: Not on file  Occupational History  . Not on file  Social Needs  . Financial resource strain: Not on file  . Food insecurity:    Worry: Not on file    Inability: Not on file  . Transportation needs:    Medical: Not on file    Non-medical: Not on file  Tobacco Use  . Smoking status: Former Smoker    Types: Cigarettes    Last attempt to quit: 10/29/1981    Years since quitting: 36.1  . Smokeless tobacco: Never Used  Substance and Sexual Activity  . Alcohol use: Yes    Comment: occ  . Drug use: No  . Sexual activity: Never  Lifestyle  . Physical activity:    Days per week: Not on file    Minutes per session: Not on file  . Stress: Not on file  Relationships  . Social connections:    Talks on phone: Not on file    Gets together: Not on file    Attends religious service: Not on file    Active member of club or organization: Not on file    Attends meetings of clubs or organizations: Not on file    Relationship status: Not on file  . Intimate partner violence:    Fear of current or ex partner: Not on file    Emotionally abused: Not on file    Physically abused: Not on file    Forced sexual activity: Not on file  Other Topics Concern  . Not on file  Social History Narrative  . Not on file    Current Outpatient Medications  Medication Sig Dispense Refill  . allopurinol (ZYLOPRIM) 100 MG tablet TAKE 1 TABLET BY MOUTH TWICE A DAY 180 tablet 2  . aspirin EC 81 MG tablet Take 81 mg by mouth daily.    . Cholecalciferol (VITAMIN D PO) Take by mouth daily.    . citalopram (CELEXA) 20 MG tablet TAKE 1 AND 1/2  TABLETS BY MOUTH ONCE DAILY 135 tablet 1  . colchicine 0.6 MG tablet Take 0.6 mg by mouth daily as needed (gout flares).     . ferrous sulfate 325 (65 FE) MG tablet Take 325 mg by mouth daily with breakfast.    . furosemide (LASIX) 40 MG tablet TAKE 1 TABLET BY MOUTH ONCE DAILY 90 tablet 0  . glipiZIDE (GLUCOTROL) 5 MG tablet TAKE 1/2 TABLET BY MOUTH TWICE A DAY 90 tablet 0  . glucose blood test strip 1 each by Other route as needed for other. Use as instructed    . HYDROcodone-acetaminophen (NORCO/VICODIN) 5-325 MG tablet Take 1  tablet by mouth 2 (two) times daily as needed for severe pain. (Patient taking differently: Take 0.5 tablets by mouth every 8 (eight) hours as needed for severe pain. ) 45 tablet 0  . ketoconazole (NIZORAL) 2 % cream Apply 1 application topically as needed for irritation. 30 g 0  . levothyroxine (SYNTHROID, LEVOTHROID) 125 MCG tablet TAKE 1 TABLET BY MOUTH ONCE DAILY 90 tablet 1  . lisinopril (PRINIVIL,ZESTRIL) 20 MG tablet TAKE 1 TABLET ONCE DAILY 90 tablet 1  . Multiple Vitamins-Minerals (MULTIVITAMIN ADULTS PO) Take 1 tablet by mouth daily.    . pantoprazole (PROTONIX) 40 MG tablet TAKE 1 TABLET BY MOUTH ONCE DAILY 90 tablet 1  . pravastatin (PRAVACHOL) 40 MG tablet TAKE 1 TABLET BY MOUTH EVERY DAY 90 tablet 1  . zolpidem (AMBIEN) 10 MG tablet TAKE 1 TABLET BY MOUTH AT BEDTIME 30 tablet 3   No current facility-administered medications for this visit.     No Known Allergies    Review of Systems:   General:  normal appetite, normal energy, no weight gain, no weight loss, no fever  Cardiac:  no chest pain with exertion, no chest pain at rest, minimal SOB with  exertion, no resting SOB, no PND, no orthopnea, no palpitations, no arrhythmia, no atrial fibrillation, occasional mild LE edema, no dizzy spells, no syncope  Respiratory:  minimal shortness of breath, no home oxygen, no productive cough, no dry cough, no bronchitis, no wheezing, no hemoptysis, no asthma, no  pain with inspiration or cough, no sleep apnea, no CPAP at night  GI:   no difficulty swallowing, + reflux, no frequent heartburn, + hiatal hernia, no abdominal pain, no constipation, no diarrhea, no hematochezia, no hematemesis, no melena  GU:   no dysuria,  no frequency, no urinary tract infection, no hematuria, no kidney stones, +chronic kidney disease  Vascular:  no pain suggestive of claudication, no pain in feet, no leg cramps, no varicose veins, no DVT, no non-healing foot ulcer  Neuro:   no stroke, no TIA's, no seizures, no headaches, no temporary blindness one eye,  no slurred speech, no peripheral neuropathy, no chronic pain, no instability of gait, no memory/cognitive dysfunction  Musculoskeletal: + arthritis, + joint swelling, + myalgias, no difficulty walking, normal mobility   Skin:   no rash, no itching, no skin infections, no pressure sores or ulcerations  Psych:   + anxiety, no depression, no nervousness, no unusual recent stress  Eyes:   no blurry vision, no floaters, no recent vision changes,  wears glasses or contacts  ENT:   + hearing loss, no loose or painful teeth, partial dentures, last saw dentist 09/2017  Hematologic:  no easy bruising, no abnormal bleeding, no clotting disorder, no frequent epistaxis  Endocrine:  + diabetes, does check CBG's at home    Physical Exam:   BP 116/64 (BP Location: Left Arm, Patient Position: Sitting, Cuff Size: Large) Comment (Cuff Size): MANUALLY  Pulse 70   Resp 16   Ht 5' 8.5" (1.74 m)   Wt 244 lb (110.7 kg)   SpO2 97% Comment: ON RA  BMI 36.56 kg/m   General:  Obese,  well-appearing, in no distress  HEENT:  Unremarkable, NCAT, PERLA, EOMI, oropharynx clear  Neck:   no JVD, no bruits, no adenopathy or thyromegaly  Chest:   clear to auscultation, symmetrical breath sounds, no wheezes, no rhonchi   CV:   RRR, grade III/VI crescendo/decrescendo murmur heard best at RSB,  no diastolic murmur  Abdomen:  soft, non-tender, no masses    Extremities:  warm, well-perfused, pulses palpable, no LE edema  Rectal/GU  Deferred  Neuro:   Grossly non-focal and symmetrical throughout  Skin:   Clean and dry, no rashes, no breakdown   Diagnostic Tests:                             *[facility]*                   [address]                           [place], [place] [address]                            [phone]  ------------------------------------------------------------------- Transthoracic Echocardiography  Patient:    Ralph, Benavidez MR #:       [id number] Study Date: 09/06/2017 Gender:     F Age:        5 Height:     174 cm Weight:     108.4 kg BSA:        2.33 m^2 Pt. Status: Room:   ATTENDING    Rozann Lesches, M.D.  ORDERING     Rozann Lesches, M.D.  REFERRING    Rozann Lesches, M.D.  SONOGRAPHER  [address], RVT, RDCS  PERFORMING   [facility]  cc:  ------------------------------------------------------------------- LV EF: 60% -   65%  ------------------------------------------------------------------- History:   PMH:  Acquired from the patient and from the patient&'s chart. 3/6. Patient says dyspnea is stable.  Dyspnea and systolic murmur.  Aortic valve disease.  Risk factors:  Former tobacco use. Hypertension. Diabetes mellitus. Dyslipidemia.  ------------------------------------------------------------------- Study Conclusions  - Left ventricle: The cavity size was normal. Wall thickness was   increased in a pattern of moderate LVH. Systolic function was   normal. The estimated ejection fraction was in the range of 60%   to 65%. Wall motion was normal; there were no regional wall   motion abnormalities. Doppler parameters are consistent with   abnormal left ventricular relaxation (grade 1 diastolic   dysfunction). Doppler parameters are consistent with high   ventricular filling pressure. - Aortic valve: Severely calcified annulus. Trileaflet; severely   thickened  leaflets. There was severe stenosis. There was mild   regurgitation. Mean gradient (S): 44 mm Hg. Valve area (VTI):   0.89 cm^2. Valve area (Vmax): 0.87 cm^2. Valve area (Vmean): 0.83   cm^2. - Mitral valve: Severely calcified annulus. Moderately thickened   leaflets . The findings are consistent with mild stenosis. Valve   area by continuity equation (using LVOT flow): 1.71 cm^2. - Left atrium: The atrium was mildly dilated. - Technically difficult study.  ------------------------------------------------------------------- Study data:  Comparison was made to the study of 02/22/2017. Previous Echo showed LV EF 60-65%, no RWMA, moderately calcified Ao valve annulus, trileaflet with moderate to severe stenosis (mean gradient 39 mmHg).  Study status:  Routine.  Procedure: Transthoracic echocardiography. Image quality was adequate.  Study completion:  There were no complications.          Transthoracic echocardiography.  M-mode, complete 2D, spectral Doppler, and color Doppler.  Birthdate:  Patient birthdate: [birth year].  Age:  Patient is 67 yr old.  Sex:  Gender: female.    BMI: 35.8 kg/m^2.  Blood pressure:  130/80  Patient status:  Outpatient.  Study date: Study date: 09/06/2017. Study time: 08:04 AM.  -------------------------------------------------------------------  ------------------------------------------------------------------- Left ventricle:  The cavity size was normal. Wall thickness was increased in a pattern of moderate LVH. Systolic function was normal. The estimated ejection fraction was in the range of 60% to 65%. Wall motion was normal; there were no regional wall motion abnormalities. Doppler parameters are consistent with abnormal left ventricular relaxation (grade 1 diastolic dysfunction). Doppler parameters are consistent with high ventricular filling pressure.   ------------------------------------------------------------------- Aortic valve:   Severely  calcified annulus. Trileaflet; severely thickened leaflets.  Doppler:   There was severe stenosis.   There was mild regurgitation.    VTI ratio of LVOT to aortic valve: 0.31. Valve area (VTI): 0.89 cm^2. Indexed valve area (VTI): 0.38 cm^2/m^2. Peak velocity ratio of LVOT to aortic valve: 0.31. Valve area (Vmax): 0.87 cm^2. Indexed valve area (Vmax): 0.37 cm^2/m^2. Mean velocity ratio of LVOT to aortic valve: 0.29. Valve area (Vmean): 0.83 cm^2. Indexed valve area (Vmean): 0.36 cm^2/m^2. Mean gradient (S): 44 mm Hg. Peak gradient (S): 66 mm Hg.  ------------------------------------------------------------------- Aorta:  Aortic root: The aortic root was normal in size.  ------------------------------------------------------------------- Mitral valve:   Severely calcified annulus. Moderately thickened leaflets .  Doppler:   The findings are consistent with mild stenosis.      Valve area by continuity equation (using LVOT flow): 1.71 cm^2. Indexed valve area by continuity equation (using LVOT flow): 0.73 cm^2/m^2.    Mean gradient (D): 4 mm Hg. Peak gradient (D): 8 mm Hg.  ------------------------------------------------------------------- Left atrium:  Poorly visualized. The atrium was mildly dilated.  ------------------------------------------------------------------- Atrial septum:  Poorly visualized.  ------------------------------------------------------------------- Right ventricle:  The cavity size was normal. Wall thickness was normal. Systolic function was normal.  ------------------------------------------------------------------- Pulmonic valve:   Not well visualized.  Doppler:   There was no evidence for stenosis.   There was no significant regurgitation.   ------------------------------------------------------------------- Tricuspid valve:  Not well visualized.  Doppler:   There was no evidence for stenosis.   There was no significant regurgitation.     ------------------------------------------------------------------- Pulmonary artery:    Systolic pressure could not be accurately estimated.   Inadequate TR jet.  ------------------------------------------------------------------- Right atrium:  The atrium was normal in size.  ------------------------------------------------------------------- Pericardium:  There was no pericardial effusion.  ------------------------------------------------------------------- Systemic veins: Inferior vena cava: The vessel was normal in size. The respirophasic diameter changes were in the normal range (>= 50%), consistent with normal central venous pressure.  ------------------------------------------------------------------- Measurements   Left ventricle                            Value          Reference  LV ID, ED, PLAX chordal                   48.6  mm       43 - 52  LV ID, ES, PLAX chordal                   35.1  mm       23 - 38  LV PW thickness, ED                       12.8  mm       ---------  IVS/LV PW ratio, ED  0.94           <=1.3    Ventricular septum                        Value          Reference  IVS thickness, ED                         12    mm       ---------    LVOT                                      Value          Reference  LVOT ID, S                                19    mm       ---------  LVOT area                                 2.84  cm^2     ---------  LVOT peak velocity, S                     125   cm/s     ---------  LVOT mean velocity, S                     91.4  cm/s     ---------  LVOT VTI, S                               33.5  cm       ---------  LVOT peak gradient, S                     6     mm Hg    ---------    Aortic valve                              Value          Reference  Aortic valve peak velocity, S             406   cm/s     ---------  Aortic valve mean velocity, S             312   cm/s     ---------  Aortic valve  VTI, S                       107   cm       ---------  Aortic mean gradient, S                   44    mm Hg    ---------  Aortic peak gradient, S                   66    mm Hg    ---------  VTI ratio, LVOT/AV  0.31           ---------  Aortic valve area, VTI                    0.89  cm^2     ---------  Aortic valve area/bsa, VTI                0.38  cm^2/m^2 ---------  Velocity ratio, peak, LVOT/AV             0.31           ---------  Aortic valve area, peak velocity          0.87  cm^2     ---------  Aortic valve area/bsa, peak               0.37  cm^2/m^2 ---------  velocity  Velocity ratio, mean, LVOT/AV             0.29           ---------  Aortic valve area, mean velocity          0.83  cm^2     ---------  Aortic valve area/bsa, mean               0.36  cm^2/m^2 ---------  velocity  Aortic regurg pressure half-time          972   ms       ---------    Aorta                                     Value          Reference  Aortic root ID, ED                        30    mm       ---------    Left atrium                               Value          Reference  LA ID, A-P, ES                            51    mm       ---------  LA ID/bsa, A-P                            2.19  cm/m^2   <=2.2  LA volume/bsa, S                          29.6  ml/m^2   ---------    Mitral valve                              Value          Reference  Mitral E-wave peak velocity               144   cm/s     ---------  Mitral A-wave peak velocity               180   cm/s     ---------  Mitral mean gradient, D                   4     mm Hg    ---------  Mitral peak gradient, D                   8     mm Hg    ---------  Mitral E/A ratio, peak                    0.8            ---------  Mitral valve area, LVOT                   1.71  cm^2     ---------  continuity  Mitral valve area/bsa, LVOT               0.73  cm^2/m^2 ---------  continuity  Mitral annulus VTI, D                     55.6   cm       ---------    Right ventricle                           Value          Reference  TAPSE                                     25.3  mm       ---------  RV s&', lateral, S                         13.2  cm/s     ---------  Legend: (L)  and  (H)  mark values outside specified reference range.  ------------------------------------------------------------------- Prepared and Electronically Authenticated by  Kerry Hough, M.D. 2019-06-20T10:17:59    Physicians   Panel Physicians Referring Physician Case Authorizing Physician  Burnell Blanks, MD (Primary)    Procedures   RIGHT/LEFT HEART CATH AND CORONARY ANGIOGRAPHY  Conclusion     Prox LAD lesion is 20% stenosed.  Prox RCA to Mid RCA lesion is 20% stenosed.  Hemodynamic findings consistent with mild pulmonary hypertension.   1. Mild non-obstructive CAD 2. Severe aortic stenosis with mean gradient of 26 mmHg, peak gradient 34 mmHg. (By echo the mean gradient is 59mmHg and the leaflets are thickened with poor mobility).   Recommendations: She will need AVR. Her risk for surgical AVR seems to be low. I will make a referral to see Dr. Cyndia Bent or Dr. Roxy Manns in the [place] surgery office to evaluate for surgical AVR. We will discuss her case at our valve team meeting as there is a possibility of TAVR. Findings relayed to Dr. Domenic Polite.     Indications   Severe aortic stenosis [I35.0 (ICD-10-CM)]  Procedural Details/Technique   Technical Details Indication: 67 yo female with history of DM, HTN, stage three chronic kidney disease with severe aortic stenosis.  Procedure: The risks, benefits, complications, treatment options, and expected outcomes were discussed with the patient. The patient and/or family concurred with the proposed plan, giving informed consent. The patient was brought to the cath lab after IV hydration was given. The patient was sedated with Versed and Fentanyl. The antecubital IV in the right arm  was prepped and draped and change for a 5 [place] sheath. Right heart cath performed with a balloon tipped catheter. The right wrist was prepped and draped in a sterile fashion. 1% lidocaine was used for local anesthesia. Using the modified Seldinger access technique, a 5 French sheath was placed in the right radial artery. 3 mg Verapamil was given through the sheath. 5500 units IV heparin was given. Standard diagnostic catheters were used to perform selective coronary angiography. The RCA had a high anterior takeoff and an AL-1 catheter was used to engage this vessel. I crossed the aortic valve with an AL-1 catheter and a straight wire. LV pressures measured. The sheath was removed from the right radial artery and a Terumo hemostasis band was applied at the arteriotomy site on the right wrist.     Estimated blood loss <50 mL.  During this procedure the patient was administered the following to achieve and maintain moderate conscious sedation: Versed 2 mg, Fentanyl 50 mcg, while the patient's heart rate, blood pressure, and oxygen saturation were continuously monitored. The period of conscious sedation was 39 minutes, of which I was present face-to-face 100% of this time.  Complications   Complications documented before study signed (10/18/2017 1:22 PM EDT)    RIGHT/LEFT HEART CATH AND CORONARY ANGIOGRAPHY   None Documented by Burnell Blanks, MD 10/18/2017 1:19 PM EDT  Time Range: Intraprocedure      Coronary Findings   Diagnostic  Dominance: Right  Left Anterior Descending  Vessel is large.  Prox LAD lesion 20% stenosed  Prox LAD lesion is 20% stenosed.  First Diagonal Branch  Vessel is small in size.  Second Diagonal Branch  Vessel is moderate in size.  Left Circumflex  First Obtuse Marginal Branch  Vessel is moderate in size.  Third Obtuse Marginal Branch  Vessel is moderate in size.  Right Coronary Artery  Prox RCA to Mid RCA lesion 20% stenosed  Prox RCA to Mid RCA  lesion is 20% stenosed.  Intervention   No interventions have been documented.  Right Heart   Right Heart Pressures Hemodynamic findings consistent with mild pulmonary hypertension. Elevated LV EDP consistent with volume overload.  Coronary Diagrams   Diagnostic Diagram       Implants    No implant documentation for this case.  MERGE Images   Show images for CARDIAC CATHETERIZATION   Link to Procedure Log   Procedure Log    Hemo Data    Most Recent Value  Fick Cardiac Output 7.76 L/min  Fick Cardiac Output Index 3.45 (L/min)/BSA  Aortic Mean Gradient 26 mmHg  Aortic Peak Gradient 34 mmHg  Aortic Valve Area 1.59  Aortic Value Area Index 0.71 cm2/BSA  RA A Wave 13 mmHg  RA V Wave 10 mmHg  RA Mean 7 mmHg  RV Systolic Pressure 38 mmHg  RV Diastolic Pressure 5 mmHg  RV EDP 12 mmHg  PA Systolic Pressure 40 mmHg  PA Diastolic Pressure 21 mmHg  PA Mean 30 mmHg  PW A Wave 23 mmHg  PW V Wave 24 mmHg  PW Mean 19 mmHg  AO Systolic Pressure 240 mmHg  AO Diastolic Pressure 64 mmHg  AO Mean 88 mmHg  LV Systolic Pressure 973 mmHg  LV Diastolic Pressure 8 mmHg  LV EDP 16 mmHg  AOp Systolic Pressure 532 mmHg  AOp Diastolic Pressure 62 mmHg  AOp Mean Pressure 88 mmHg  LVp Systolic Pressure 992 mmHg  LVp Diastolic Pressure 7 mmHg  LVp EDP Pressure 15  mmHg  QP/QS 1  TPVR Index 8.7 HRUI  TSVR Index 25.52 HRUI  PVR SVR Ratio 0.14  TPVR/TSVR Ratio 0.34    RISK SCORES Procedure: Isolated AVR CALCULATE  Risk of Mortality:  1.704%   Renal Failure:  2.752%   Permanent Stroke:  0.970%   Prolonged Ventilation:  8.335%   DSW Infection:  0.117%   Reoperation:  2.766%   Morbidity or Mortality:  13.237%   Short Length of Stay:  45.109%   Long Length of Stay:  3.641%    Impression:  This 67 year old woman has what is probably stage D, severe, symptomatic aortic stenosis with New York Heart Association class l- II symptoms of exertional shortness of breath which she says is  minimal and does not affect her ability to do what she wants to do.  She denies fatigue but does get tired at the end of the day which would not be unusual.  I have personally reviewed her echocardiogram and cardiac catheterization.  Her echo shows a trileaflet aortic valve that is heavily calcified with marked thickening and restricted mobility.  The mean gradient is 44 mmHg consistent with severe aortic stenosis.  The left ventricular ejection fraction is normal.  Her cardiac catheterization shows mild nonobstructive coronary disease.  I think aortic valve replacement is indicated in this patient to prevent progression of symptoms and deterioration in left ventricular function.  She said that she feels fine with minimal symptoms so I do not think there is any urgency to proceed with valve replacement.  I discussed the options of open surgical aortic valve replacement and transcatheter aortic valve replacement with her and her husband.  We discussed the pros and cons of both.  Her operative risk by the STS risk calculator is 1.7% putting her into the low risk group.  I think she would probably have a prolonged recovery due to obesity, diabetes, and stage III-IV chronic kidney disease and would have an increased risk of postoperative worsening of renal function with open surgery.  I discussed the unknown long-term risk of structural valve deterioration with a transcatheter valve which would be my greatest concern in doing transcatheter aortic valve replacement in a 67 year old patient.  This is balance by the increased risk of open surgery in her case.  She would like to think about this a little longer before deciding about proceeding with a CT scan work-up for transcatheter aortic valve replacement.   Plan:  The patient will think about the options of open surgical aortic valve replacement and transcatheter aortic valve replacement and will call us with any further questions or after she makes a decision  whether to proceed with further work-up for transcatheter valve replacement.  I spent 60 minutes performing this consultation and > 50% of this time was spent face to face counseling and coordinating the care of this patient's severe symptomatic aortic stenosis.  Gaye Pollack, MD 12/13/2017 1:32 PM

## 2017-12-14 ENCOUNTER — Ambulatory Visit: Payer: Medicare Other | Admitting: Family Medicine

## 2017-12-18 ENCOUNTER — Telehealth: Payer: Self-pay | Admitting: Family Medicine

## 2017-12-18 MED ORDER — PREDNISONE 20 MG PO TABS: ORAL_TABLET | ORAL | 0 refills | Status: DC

## 2017-12-18 NOTE — Telephone Encounter (Signed)
Prednisone 20 mg , #18 , 3qd for 3d then 2qd for 3d then 1qd for 3d

## 2017-12-18 NOTE — Addendum Note (Signed)
Addended by: Karle Barr on: 12/18/2017 05:24 PM   Modules accepted: Orders

## 2017-12-18 NOTE — Telephone Encounter (Signed)
I sent in to requested pharmacy and tried to call pt her vm is full.

## 2017-12-18 NOTE — Telephone Encounter (Signed)
Pt is having a flare up of gout on her big toe. Pt wanted to know she can have a prescription of Prednisone. Please advise. Thanks

## 2017-12-19 NOTE — Telephone Encounter (Signed)
Pt is aware and has picked up medication

## 2017-12-21 ENCOUNTER — Encounter: Payer: Self-pay | Admitting: Family Medicine

## 2017-12-21 ENCOUNTER — Ambulatory Visit (INDEPENDENT_AMBULATORY_CARE_PROVIDER_SITE_OTHER): Payer: Medicare Other | Admitting: Family Medicine

## 2017-12-21 VITALS — BP 122/80 | Ht 68.5 in | Wt 248.0 lb

## 2017-12-21 DIAGNOSIS — Z23 Encounter for immunization: Secondary | ICD-10-CM

## 2017-12-21 DIAGNOSIS — I10 Essential (primary) hypertension: Secondary | ICD-10-CM | POA: Diagnosis not present

## 2017-12-21 DIAGNOSIS — F32A Depression, unspecified: Secondary | ICD-10-CM

## 2017-12-21 DIAGNOSIS — E039 Hypothyroidism, unspecified: Secondary | ICD-10-CM

## 2017-12-21 DIAGNOSIS — F419 Anxiety disorder, unspecified: Secondary | ICD-10-CM

## 2017-12-21 DIAGNOSIS — E119 Type 2 diabetes mellitus without complications: Secondary | ICD-10-CM

## 2017-12-21 DIAGNOSIS — E785 Hyperlipidemia, unspecified: Secondary | ICD-10-CM

## 2017-12-21 DIAGNOSIS — F329 Major depressive disorder, single episode, unspecified: Secondary | ICD-10-CM

## 2017-12-21 LAB — POCT GLYCOSYLATED HEMOGLOBIN (HGB A1C): Hemoglobin A1C: 5.2 % (ref 4.0–5.6)

## 2017-12-21 MED ORDER — LISINOPRIL 20 MG PO TABS: 20.0000 mg | ORAL_TABLET | Freq: Every day | ORAL | 0 refills | Status: DC

## 2017-12-21 MED ORDER — CITALOPRAM HYDROBROMIDE 20 MG PO TABS: 30.0000 mg | ORAL_TABLET | Freq: Every day | ORAL | 0 refills | Status: DC

## 2017-12-21 MED ORDER — PANTOPRAZOLE SODIUM 40 MG PO TBEC: 40.0000 mg | DELAYED_RELEASE_TABLET | Freq: Every day | ORAL | 0 refills | Status: DC

## 2017-12-21 MED ORDER — GLIPIZIDE 5 MG PO TABS: ORAL_TABLET | ORAL | 1 refills | Status: DC

## 2017-12-21 MED ORDER — LEVOTHYROXINE SODIUM 125 MCG PO TABS: 125.0000 ug | ORAL_TABLET | Freq: Every day | ORAL | 0 refills | Status: DC

## 2017-12-21 MED ORDER — ALLOPURINOL 100 MG PO TABS: 100.0000 mg | ORAL_TABLET | Freq: Two times a day (BID) | ORAL | 0 refills | Status: DC

## 2017-12-21 MED ORDER — FUROSEMIDE 40 MG PO TABS: 40.0000 mg | ORAL_TABLET | Freq: Every day | ORAL | 0 refills | Status: DC

## 2017-12-21 MED ORDER — PRAVASTATIN SODIUM 40 MG PO TABS: 40.0000 mg | ORAL_TABLET | Freq: Every day | ORAL | 0 refills | Status: DC

## 2017-12-21 NOTE — Progress Notes (Signed)
Subjective:    Patient ID: Sabrina Deleon, female    DOB: April 13, 1950, 67 y.o.   MRN: [id number]  Diabetes  She presents for her follow-up diabetic visit. She has type 2 diabetes mellitus. Pertinent negatives for diabetes include no chest pain, no fatigue, no polydipsia, no polyphagia and no polyuria. Current diabetic treatments: glipizide. She is compliant with treatment all of the time. Home blood sugar record trend: running in the 100's. She does not see a podiatrist.Eye exam is current.   Reports occasionally waking up at night sweating, does not check her blood sugar when this occurs, states she just gets up and eats a banana.   Pt states no concerns today.   Results for orders placed or performed in visit on 12/21/17  POCT glycosylated hemoglobin (Hb A1C)  Result Value Ref Range   Hemoglobin A1C 5.2 4.0 - 5.6 %   HbA1c POC (<> result, manual entry)     HbA1c, POC (prediabetic range)     HbA1c, POC (controlled diabetic range)     Pain management: Hydrocodone takes about 1/2 tablet once per day for hip and knee pain. Helps control pain to increase her functionality. No hx of abuse. PMP checked. She does not need refills at this time.  HTN: compliant with meds, no known elevated pressures Patient states she does try to watch her diet to some degree unable to do much activity  Thyroid: compliant with meds, occasionally skips a morning dose. Denies any adverse effects.  States energy level overall doing well and is periodically monitored by labs  Depression: well controlled on current med, mood is stable, phq-9 reviewed denies any severe depression symptoms no crying spells suicidal etc.  GERD: well-controlled on current meds, occasional symptoms, does try to take every other day but has a lot of burping on those days.  He denies any severe heartburn issues no dysphagia  Hyperlipidemia: compliant with current meds, denies adverse effects watches diet as best she can takes her medicine  on a regular basis  Review of Systems  Constitutional: Negative for fatigue.  Respiratory: Negative for shortness of breath.   Cardiovascular: Negative for chest pain and leg swelling.  Endocrine: Negative for polydipsia, polyphagia and polyuria.  Musculoskeletal: Positive for arthralgias (bilateral knee pain ).  Psychiatric/Behavioral: Negative for dysphoric mood and suicidal ideas.       Objective:   Physical Exam  Constitutional: She is oriented to person, place, and time. She appears well-developed and well-nourished. No distress.  HENT:  Head: Normocephalic and atraumatic.  Neck: Neck supple. No thyromegaly present.  Cardiovascular: Normal rate and regular rhythm.  Murmur (known aortic stenosis) heard. Pulmonary/Chest: Effort normal and breath sounds normal. No respiratory distress. She has no wheezes.  Musculoskeletal: She exhibits no edema.  Lymphadenopathy:    She has no cervical adenopathy.  Neurological: She is alert and oriented to person, place, and time. Coordination normal.  Skin: Skin is warm and dry.  Psychiatric: She has a normal mood and affect.  Nursing note and vitals reviewed.      Assessment & Plan:  1. Diabetes: Well-controlled with A1C of 5.2 today. The patient was seen today as part of a comprehensive visit for diabetes.  We will decrease her dose of glipizide to half a tablet daily.  The risk of low sugar is present with her current A1c.  Encouraged her to check her blood sugars when she feels any symptoms of hypoglycemia and let us know.  Encourage regular physical activity  and healthy diet.  Urine microalbumin done today.  She will follow-up in 6 months.  Diabetic foot exam due at next visit.  Encouraged continued compliance with yearly eye exam.  2. Hypertension: well controlled on current medications will refill and follow-up in 6 months   3.  Hyperlipidemia: Well-controlled on current medications will refill recommended she take this at night.   Follow-up in 6 months.  4.  Hypothyroidism: Well-controlled, will refill medication.  Follow-up in 6 months.  5.  Depression: Well controlled on current medication mood is stable.  PHQ 9 reviewed with patient.  Will refill medication follow-up in 6 months.  6.  GERD: Well controlled, occasional symptoms.  Will refill medication.  7.  Pain management: Well-controlled with current dose of hydrocodone.  Patient takes half a tablet once a day.  This helps reduce her pain and increases her function.  She does not need refills at this time.  She will follow-up with Dr. Nicki Reaper in January and at that time we will get a urine drug screen.  25 minutes was spent with the patient.  This statement verifies that 25 minutes was indeed spent with the patient.  More than 50% of this visit-total duration of the visit-was spent in counseling and coordination of care. The issues that the patient came in for today as reflected in the diagnosis (s) please refer to documentation for further details.  As attending physician to this patient visit, this patient was seen in conjunction with the nurse practitioner.  The history,physical and treatment plan was reviewed with the nurse practitioner and pertinent findings were verified with the patient.  Also the treatment plan was reviewed with the patient while they were present. SAL

## 2017-12-22 LAB — SPECIMEN STATUS REPORT

## 2017-12-22 LAB — MICROALBUMIN / CREATININE URINE RATIO
CREATININE, UR: 23.4 mg/dL
Microalb/Creat Ratio: <12.8 mg/g creat (ref 0.0–30.0)

## 2017-12-31 ENCOUNTER — Telehealth: Payer: Self-pay | Admitting: Family Medicine

## 2017-12-31 NOTE — Telephone Encounter (Signed)
Sabrina Deleon with [facility] calling with Results on Sabrina Deleon. perficial artial disease did come back positivie moderate. Left foot is 0.71 Right foot 0.82, no symptoms. She is taking asprin. Her A1C 6.2.   The only recommendation they make is for further testing and to take the pt off of lisinopril with her having chronic kidney disease stage 3.

## 2017-12-31 NOTE — Telephone Encounter (Signed)
Please see below.

## 2018-01-04 ENCOUNTER — Telehealth: Payer: Self-pay | Admitting: Family Medicine

## 2018-01-04 DIAGNOSIS — I10 Essential (primary) hypertension: Secondary | ICD-10-CM

## 2018-01-04 NOTE — Telephone Encounter (Signed)
Please also let the patient know that I would like for her to do a metabolic 7 midday when she is well hydrated to get true reflection of her creatinine  Diagnosis renal insufficiency

## 2018-01-04 NOTE — Addendum Note (Signed)
Addended by: Karle Barr on: 01/04/2018 11:43 AM   Modules accepted: Orders

## 2018-01-04 NOTE — Telephone Encounter (Signed)
Labs placed and tried calling pt not excepting calls at this time.

## 2018-01-04 NOTE — Telephone Encounter (Signed)
Tried calling pt phone not taking calls at this time.

## 2018-01-04 NOTE — Telephone Encounter (Signed)
Please find out from the patient the studies I did to look at her blood flow with her legs was that a test they did in the home or at the hospital? Also find out from the patient is she having any leg pain or calf pain with walking to where she has to stop because of these issues? It is quite possible she is developing peripheral vascular disease and may need further evaluation Let her know it is important to get the information on this you will relay this to myself and we will get back in contact with her next week

## 2018-01-07 NOTE — Telephone Encounter (Signed)
Pt returned call and informed that labs were placed. Pt verbalized understanding.

## 2018-01-07 NOTE — Telephone Encounter (Signed)
Pt states the test was done at home. Pt is not having any leg or calf pain at this time. Please advise. Thank you

## 2018-01-10 ENCOUNTER — Other Ambulatory Visit: Payer: Self-pay

## 2018-01-10 DIAGNOSIS — R0602 Shortness of breath: Secondary | ICD-10-CM

## 2018-01-10 DIAGNOSIS — I350 Nonrheumatic aortic (valve) stenosis: Secondary | ICD-10-CM

## 2018-01-20 NOTE — Telephone Encounter (Signed)
We will hold off on any type of testing currently

## 2018-01-22 ENCOUNTER — Other Ambulatory Visit: Payer: Self-pay

## 2018-01-22 DIAGNOSIS — N289 Disorder of kidney and ureter, unspecified: Secondary | ICD-10-CM

## 2018-01-22 DIAGNOSIS — I350 Nonrheumatic aortic (valve) stenosis: Secondary | ICD-10-CM

## 2018-01-22 NOTE — Patient Outreach (Signed)
[facility]) Care Management  01/22/2018  Sabrina Deleon 09-16-1950 [id number]   Medication Adherence call to Sabrina Deleon spoke with patient she is only taking 1/2 tablet on Glipizide 5 mg and has plenty at this time, she explain her a1c was showing low last time she went to the doctor and now he is trying 1/2 tablet  to keep an eye on her sugar.Sabrina Deleon is showing past due under [facility].   [facility] Direct Dial [phone]  Fax [phone] [email or web address]

## 2018-02-01 ENCOUNTER — Encounter (HOSPITAL_COMMUNITY): Payer: Self-pay

## 2018-02-01 ENCOUNTER — Ambulatory Visit (HOSPITAL_COMMUNITY)
Admission: RE | Admit: 2018-02-01 | Discharge: 2018-02-01 | Disposition: A | Discharge status: Home / Self Care | Payer: Medicare Other | Source: Ambulatory Visit | Attending: Surgery | Admitting: Surgery

## 2018-02-01 ENCOUNTER — Ambulatory Visit (HOSPITAL_BASED_OUTPATIENT_CLINIC_OR_DEPARTMENT_OTHER)
Admission: RE | Admit: 2018-02-01 | Discharge: 2018-02-01 | Disposition: A | Discharge status: Home / Self Care | Payer: Medicare Other | Source: Ambulatory Visit | Attending: Surgery | Admitting: Surgery

## 2018-02-01 DIAGNOSIS — I350 Nonrheumatic aortic (valve) stenosis: Secondary | ICD-10-CM

## 2018-02-01 DIAGNOSIS — R0602 Shortness of breath: Secondary | ICD-10-CM

## 2018-02-01 DIAGNOSIS — I6523 Occlusion and stenosis of bilateral carotid arteries: Secondary | ICD-10-CM | POA: Diagnosis not present

## 2018-02-01 DIAGNOSIS — I517 Cardiomegaly: Secondary | ICD-10-CM | POA: Insufficient documentation

## 2018-02-01 DIAGNOSIS — N289 Disorder of kidney and ureter, unspecified: Secondary | ICD-10-CM

## 2018-02-01 LAB — PULMONARY FUNCTION TEST
DL/VA % pred: 87 %
DL/VA: 4.68 ml/min/mmHg/L
DLCO unc % pred: 83 %
DLCO unc: 26.03 ml/min/mmHg
FEF 25-75 PRE: 3.1 L/s
FEF 25-75 Post: 2.83 L/sec
FEF2575-%Change-Post: -8 %
FEF2575-%Pred-Post: 118 %
FEF2575-%Pred-Pre: 130 %
FEV1-%Change-Post: -1 %
FEV1-%PRED-POST: 96 %
FEV1-%PRED-PRE: 98 %
FEV1-POST: 2.79 L
FEV1-PRE: 2.85 L
FEV1FVC-%Change-Post: -3 %
FEV1FVC-%Pred-Pre: 106 %
FEV6-%CHANGE-POST: 2 %
FEV6-%PRED-PRE: 95 %
FEV6-%Pred-Post: 97 %
FEV6-POST: 3.54 L
FEV6-Pre: 3.45 L
FEV6FVC-%Change-Post: 0 %
FEV6FVC-%PRED-POST: 103 %
FEV6FVC-%Pred-Pre: 103 %
FVC-%Change-Post: 1 %
FVC-%PRED-PRE: 92 %
FVC-%Pred-Post: 94 %
FVC-POST: 3.54 L
FVC-Pre: 3.48 L
POST FEV6/FVC RATIO: 100 %
Post FEV1/FVC ratio: 79 %
Pre FEV1/FVC ratio: 82 %
Pre FEV6/FVC Ratio: 100 %
RV % pred: 91 %
RV: 2.15 L
TLC % PRED: 99 %
TLC: 5.76 L

## 2018-02-01 MED ORDER — SODIUM BICARBONATE BOLUS VIA INFUSION
INTRAVENOUS | Status: AC
  Administered 2018-02-01: 75 meq via INTRAVENOUS
  Filled 2018-02-01: qty 1

## 2018-02-01 MED ORDER — SODIUM BICARBONATE 8.4 % IV SOLN
INTRAVENOUS | Status: DC
  Filled 2018-02-01: qty 500

## 2018-02-01 MED ORDER — IOPAMIDOL (ISOVUE-370) INJECTION 76%
100.0000 mL | Freq: Once | INTRAVENOUS | Status: AC | PRN
  Administered 2018-02-01: 100 mL via INTRAVENOUS

## 2018-02-01 MED ORDER — ALBUTEROL SULFATE (2.5 MG/3ML) 0.083% IN NEBU
2.5000 mg | INHALATION_SOLUTION | Freq: Once | RESPIRATORY_TRACT | Status: AC
  Administered 2018-02-01: 2.5 mg via RESPIRATORY_TRACT

## 2018-02-01 NOTE — Progress Notes (Signed)
Preliminary  Notes---Bilateral carotid duplex exam completed. Right ICA at the region of between proximal to mid segment, 40-59% stenosis. Left ICA 1-39% stenosis. Bilateral vertebral arteries are patent with antegrade flow. Daylin Eads H Mertis Mosher(RDMS RVT) 02/01/18 4:47 PM

## 2018-02-01 NOTE — Research (Signed)
[facility]  Informed Consent -LATE ENTRY                 Subject Name:  Sabrina Deleon   Subject met inclusion and exclusion criteria.  The informed consent form, study requirements and expectations were reviewed with the subject and questions and concerns were addressed prior to the signing of the consent form.  The subject verbalized understanding of the trial requirements.  The subject agreed to participate in the [facility] trial and signed the informed consent.  The informed consent was obtained prior to performance of any protocol-specific procedures for the subject. This patient was consented by Rico Sheehan on 10-18-17 at 13:49 p.m.  A copy of the signed informed consent was given to the subject and a copy was placed in the subject's medical record.   [name], Research Assistant  10/18/2017   14:30 p.m.

## 2018-02-20 ENCOUNTER — Institutional Professional Consult (permissible substitution): Payer: Medicare Other | Admitting: Surgery

## 2018-02-20 ENCOUNTER — Encounter: Payer: Self-pay | Admitting: Surgery

## 2018-02-20 ENCOUNTER — Other Ambulatory Visit: Payer: Self-pay | Admitting: Family Medicine

## 2018-02-20 ENCOUNTER — Other Ambulatory Visit: Payer: Self-pay

## 2018-02-20 ENCOUNTER — Ambulatory Visit: Payer: Medicare Other | Attending: Cardiovascular Disease | Admitting: Physical Therapy

## 2018-02-20 VITALS — BP 130/80 | HR 73 | Resp 20 | Ht 68.0 in | Wt 255.0 lb

## 2018-02-20 DIAGNOSIS — R2689 Other abnormalities of gait and mobility: Secondary | ICD-10-CM | POA: Diagnosis not present

## 2018-02-20 DIAGNOSIS — I350 Nonrheumatic aortic (valve) stenosis: Secondary | ICD-10-CM | POA: Diagnosis not present

## 2018-02-20 NOTE — Progress Notes (Signed)
HPI:  This 67 year old woman has stage D, severe, symptomatic aortic stenosis with New York Heart Association class l- II symptoms of exertional shortness of breath which she says is minimal and does not affect her ability to do what she wants to do.  She denies fatigue but does get tired at the end of the day which would not be unusual.  Her echo shows a trileaflet aortic valve that is heavily calcified with marked thickening and restricted mobility.  The mean gradient is 44 mmHg consistent with severe aortic stenosis.  The left ventricular ejection fraction is normal.  Her cardiac catheterization shows mild nonobstructive coronary disease.  I think aortic valve replacement is indicated in this patient to prevent progression of symptoms and deterioration in left ventricular function.  When I last saw her on 12/13/2017 we discussed the options of surgical aortic valve replacement versus transcatheter aortic valve replacement and she was not sure whether she wanted to proceed with further work-up for transcatheter aortic valve replacement.  She has since decided to proceed with CT work-up for TAVR and returns today to review the results and discuss treatment of her aortic stenosis.  She continues to remain active and feels well overall.  She still reports class I-ll symptoms of exertional shortness of breath.  Current Outpatient Medications  Medication Sig Dispense Refill  . allopurinol (ZYLOPRIM) 100 MG tablet Take 1 tablet (100 mg total) by mouth 2 (two) times daily. 180 tablet 0  . aspirin EC 81 MG tablet Take 81 mg by mouth daily.    . Cholecalciferol (VITAMIN D PO) Take by mouth daily.    . citalopram (CELEXA) 20 MG tablet Take 1.5 tablets (30 mg total) by mouth daily. 135 tablet 0  . colchicine 0.6 MG tablet Take 0.6 mg by mouth daily as needed (gout flares).     . ferrous sulfate 325 (65 FE) MG tablet Take 325 mg by mouth daily with breakfast.    . furosemide (LASIX) 40 MG tablet Take 1  tablet (40 mg total) by mouth daily. 90 tablet 0  . glipiZIDE (GLUCOTROL) 5 MG tablet Take one half tablet daily 45 tablet 1  . glucose blood test strip 1 each by Other route as needed for other. Use as instructed    . HYDROcodone-acetaminophen (NORCO/VICODIN) 5-325 MG tablet Take 1 tablet by mouth 2 (two) times daily as needed for severe pain. (Patient taking differently: Take 0.5 tablets by mouth every 8 (eight) hours as needed for severe pain. ) 45 tablet 0  . ketoconazole (NIZORAL) 2 % cream Apply 1 application topically as needed for irritation. 30 g 0  . levothyroxine (SYNTHROID, LEVOTHROID) 125 MCG tablet Take 1 tablet (125 mcg total) by mouth daily. 90 tablet 0  . lisinopril (PRINIVIL,ZESTRIL) 20 MG tablet Take 1 tablet (20 mg total) by mouth daily. 90 tablet 0  . Multiple Vitamins-Minerals (MULTIVITAMIN ADULTS PO) Take 1 tablet by mouth daily.    . pantoprazole (PROTONIX) 40 MG tablet Take 1 tablet (40 mg total) by mouth daily. 90 tablet 0  . pravastatin (PRAVACHOL) 40 MG tablet Take 1 tablet (40 mg total) by mouth daily. 90 tablet 0  . predniSONE (DELTASONE) 20 MG tablet Take three tablets daily for three days,then take two tablets daily for three days,then take one tablet qd for three days. 18 tablet 0  . zolpidem (AMBIEN) 10 MG tablet TAKE 1 TABLET BY MOUTH EVERY DAY AT BEDTIME 30 tablet 3   No current facility-administered  medications for this visit.      Physical Exam: BP 130/80   Pulse 73   Resp 20   Ht 5\' 8"  (1.727 m)   Wt 255 lb (115.7 kg)   SpO2 97% Comment: RA  BMI 38.77 kg/m  She looks well. Cardiac exam shows a regular rate and rhythm with a 3/6 systolic murmur along the right sternal border. There is no diastolic murmur. Lungs are clear. There is no peripheral edema.  Diagnostic Tests:  ADDENDUM REPORT: 02/03/2018 17:47  CLINICAL DATA:  67 year old female with severe aortic stenosis being evaluated for a TAVR procedure.  EXAM: Cardiac TAVR  CT  TECHNIQUE: The patient was scanned on a [employer]. A 120 kV retrospective scan was triggered in the descending thoracic aorta at 111 HU's. Gantry rotation speed was 250 msecs and collimation was .6 mm. No beta blockade or nitro were given. The 3D data set was reconstructed in 5% intervals of the R-R cycle. Systolic and diastolic phases were analyzed on a dedicated work station using MPR, MIP and VRT modes. The patient received 80 cc of contrast.  FINDINGS: Aortic Valve: Aortic valve is trileaflet with severely calcified leaflets and severely restricted leaflet opening. There is an asymmetric calcification extending 16 mm into the LVOT under the non-coronary cusp.  Aorta: Normal size, no dissection, mild diffuse atheroma and calcifications.  Sinotubular Junction: 30 x 29 mm  Ascending Thoracic Aorta: 34 x 33 mm  Aortic Arch: 27 x 25 mm  Descending Thoracic Aorta: 25 x 23 mm  Sinus of Valsalva Measurements:  Non-coronary: 33 mm  Right -coronary: 30 mm  Left -coronary: 31 mm  Coronary Artery Height above Annulus:  Left Main: 14 mm  Right Coronary: 16 mm  Virtual Basal Annulus Measurements:  Maximum/Minimum Diameter: 28.5 x 22.4 mm  Mean Diameter: 25.0 mm  Perimeter: 80.4 mm  Area: 490 mm2  Optimum Fluoroscopic Angle for Delivery: LAO 4 CAU 4  IMPRESSION: 1. Aortic valve is trileaflet with severely calcified leaflets and severely restricted leaflet opening. There is an asymmetric calcification extending 16 mm into the LVOT under the non-coronary cusp. Annular measurements are suitable for delivery of a 26 mm Edwards-SAPIEN 3 valve.  2. Sufficient coronary to annulus distance.  3. Optimum Fluoroscopic Angle for Delivery:  LAO 4 CAU 4.  4. No thrombus in the left atrial appendage.  5. Mildly dilated pulmonary artery measuring 33 mm suggestive of pulmonary hypertension.   Electronically Signed   By: Ena Dawley   On: 02/03/2018 17:47   CLINICAL DATA:  67 year old female with history of severe aortic stenosis. Preprocedural study prior to potential transcatheter aortic valve replacement (TAVR) procedure.  EXAM: CT ANGIOGRAPHY CHEST, ABDOMEN AND PELVIS  TECHNIQUE: Multidetector CT imaging through the chest, abdomen and pelvis was performed using the standard protocol during bolus administration of intravenous contrast. Multiplanar reconstructed images and MIPs were obtained and reviewed to evaluate the vascular anatomy.  CONTRAST:  168mL ISOVUE-370 IOPAMIDOL (ISOVUE-370) INJECTION 76%  COMPARISON:  CT the abdomen and pelvis 06/07/2017. Chest CT 12/13/2012.  FINDINGS: CTA CHEST FINDINGS  Cardiovascular: Heart size is mildly enlarged. There is no significant pericardial fluid, thickening or pericardial calcification. There is aortic atherosclerosis, as well as atherosclerosis of the great vessels of the mediastinum and the coronary arteries, including calcified atherosclerotic plaque in the left anterior descending coronary artery. Severe thickening calcification of the aortic valve. Severe calcification of the mitral annulus.  Mediastinum/Lymph Nodes: No pathologically enlarged mediastinal or hilar lymph  nodes. Esophagus is unremarkable in appearance. No axillary lymphadenopathy.  Lungs/Pleura: No suspicious appearing pulmonary nodules or masses. No acute consolidative airspace disease. No pleural effusions.  Musculoskeletal/Soft Tissues: Multiple old healed left-sided rib fractures are incidentally noted. There are no aggressive appearing lytic or blastic lesions noted in the visualized portions of the skeleton.  CTA ABDOMEN AND PELVIS FINDINGS  Hepatobiliary: No suspicious cystic or solid hepatic lesions. No intra or extrahepatic biliary ductal dilatation. Status post cholecystectomy.  Pancreas: No pancreatic mass. No pancreatic ductal dilatation.  No pancreatic or peripancreatic fluid or inflammatory changes.  Spleen: Unremarkable.  Adrenals/Urinary Tract: In the upper pole of the right kidney there is a calyceal diverticulum which contain several internal calculi, largest of which measures 6 mm. Left kidney and bilateral adrenal glands are normal in appearance. No hydroureteronephrosis. Urinary bladder is normal in appearance.  Stomach/Bowel: Normal appearance of the stomach. No pathologic dilatation of small bowel or colon. Normal appendix.  Vascular/Lymphatic: Aortic atherosclerosis, without evidence of aneurysm or dissection noted in the abdominal or pelvic vasculature. Vascular findings and measurements pertinent to potential TAVR procedure, as detailed below. No lymphadenopathy noted in the abdomen or pelvis.  Reproductive: Uterus and ovaries are unremarkable in appearance.  Other: No significant volume of ascites.  No pneumoperitoneum.  Musculoskeletal: Status post left hip arthroplasty. There are no aggressive appearing lytic or blastic lesions noted in the visualized portions of the skeleton.  VASCULAR MEASUREMENTS PERTINENT TO TAVR:  AORTA:  Minimal Aortic Diameter-16 x 16 mm  Severity of Aortic Calcification-mild  RIGHT PELVIS:  Right Common Iliac Artery -  Minimal Diameter-9.8 x 8.2 mm  Tortuosity-mild  Calcification-mild  Right External Iliac Artery -  Minimal Diameter-7.9 x 6.5 mm  Tortuosity-mild  Calcification-none  Right Common Femoral Artery -  Minimal Diameter-10.1 x 9.2 mm  Tortuosity-mild  Calcification-none  LEFT PELVIS:  Left Common Iliac Artery -  Minimal Diameter-10.3 x 8.8 mm  Tortuosity-mild  Calcification-mild  Left External Iliac Artery -  Minimal Diameter-7.5 x 5.2 mm  Tortuosity-mild  Calcification-none  Left Common Femoral Artery -  Minimal Diameter-8.9 x 7.6  mm  Tortuosity-mild  Calcification-none  Review of the MIP images confirms the above findings.  IMPRESSION: 1. Vascular findings and measurements pertinent to potential TAVR procedure, as detailed above. 2. Severe thickening calcification of the aortic valve, compatible with the reported clinical history of severe aortic stenosis. 3. Dilatation of the pulmonic trunk (3.7 cm in diameter), concerning for pulmonary arterial hypertension. 4. Mild cardiomegaly. 5. There are calcifications of the mitral annulus. Echocardiographic correlation for evaluation of potential valvular dysfunction may be warranted if clinically indicated. 6. Additional incidental findings, as above.   Electronically Signed   By: Vinnie Langton M.D.   On: 02/01/2018 16:59  Impression:  I have personally reviewed her CT images.  Her gated cardiac CTA shows a bar of calcium extending from the noncoronary cusp down into the left ventricle.  I think this would significantly increase her risk of perivalvular leak and given her relatively young age of 33 I think a surgical aortic valve replacement would give her the best long-term result and avoid the risk of significant perivalvular leak.  I discussed this with her and her husband and they seem to understand.  She would rather had TAVR to allow a quicker recovery but understands the increased risk of perivalvular leak and is in agreement with surgical aortic valve replacement.  She would like to wait until early next year to decide about surgery and  given her relatively mild symptoms at this time I think there is no urgency to proceed with aortic valve replacement.  Plan:  She will call us when she decides to proceed with surgical aortic valve replacement.  I will plan to see her back prior to that to answer any further questions if she desires.  I spent 15 minutes performing this established patient evaluation and > 50% of this time was spent face to face  counseling and coordinating the care of this patient's aortic stenosis.    Gaye Pollack, MD [facility] [phone]

## 2018-02-20 NOTE — Therapy (Signed)
[place], [name], [address] Phone: [phone]   Fax:  [phone]  Physical Therapy Evaluation  Patient Details  Name: Sabrina Deleon MRN: [id number] Date of Birth: 1950-06-11 Referring Provider (PT): Dr Crist Fat    Encounter Date: 02/20/2018  PT End of Session - 02/20/18 1641    Visit Number  1    Number of Visits  1    Date for PT Re-Evaluation  02/20/18    Authorization Type  [employer]     PT Start Time  0215    PT Stop Time  0258    PT Time Calculation (min)  43 min    Activity Tolerance  Patient tolerated treatment well    Behavior During Therapy  [facility] for tasks assessed/performed       Past Medical History:  Diagnosis Date  . Aortic stenosis   . Arthritis   . Chronic kidney disease    Dr. Tami Ribas- noted increase kidney function levels- took pt off Metformin and started on Onglyza for Blood sugar control-pt being followed for this.  Marland Kitchen GERD (gastroesophageal reflux disease)   . Gout   . Hypertension   . Hypothyroidism   . Pancreatitis   . Type 2 diabetes mellitus ([place])   . Wears glasses   . Wears partial dentures    Upper    Past Surgical History:  Procedure Laterality Date  . BACK SURGERY     Lumbar  . CARPAL TUNNEL RELEASE  11/02/2011   Procedure: CARPAL TUNNEL RELEASE;  Surgeon: Tennis Must, MD;  Location: [facility];  Service: Orthopedics;  Laterality: Right;  . CARPAL TUNNEL RELEASE Left 01/14/2015   Procedure: LEFT CARPAL TUNNEL RELEASE;  Surgeon: Leanora Cover, MD;  Location: [facility];  Service: Orthopedics;  Laterality: Left;  . CHOLECYSTECTOMY     laparoscopic  . COLONOSCOPY    . COLONOSCOPY N/A 11/12/2015   Procedure: COLONOSCOPY;  Surgeon: Rogene Houston, MD;  [facility];  Service: Endoscopy;  Laterality: N/A;  9:30  . ESOPHAGOGASTRODUODENOSCOPY N/A 11/12/2015   Procedure: ESOPHAGOGASTRODUODENOSCOPY (EGD);  Surgeon:  Rogene Houston, MD;  [facility];  Service: Endoscopy;  Laterality: N/A;  . RIGHT/LEFT HEART CATH AND CORONARY ANGIOGRAPHY N/A 10/18/2017   Procedure: RIGHT/LEFT HEART CATH AND CORONARY ANGIOGRAPHY;  Surgeon: Burnell Blanks, MD;  Location: [facility];  Service: Cardiovascular;  Laterality: N/A;  . TOTAL HIP ARTHROPLASTY Left 03/22/2016   Procedure: LEFT TOTAL HIP ARTHROPLASTY ANTERIOR APPROACH;  Surgeon: Gaynelle Arabian, MD;  Location: [facility];  Service: Orthopedics;  Laterality: Left;    There were no vitals filed for this visit.   Subjective Assessment - 02/20/18 1422    Subjective  Patient's chief complaint is pain in her knees and back. She has increased pain with walking. She does have some increased shortness of breath when she ambualtes fast.     Currently in Pain?  No/denies   Has signifczant pain transfering from sit to stand.         [facility] PT Assessment - 02/20/18 0001      Assessment   Medical Diagnosis  Severe Aortic Stenosis     Referring Provider (PT)  Dr Crist Fat     Onset Date/Surgical Date  --   Past month    Hand Dominance  Right    Next MD Visit  today     Prior Therapy  nfor her right shoulder  Precautions   Precautions  None      Restrictions   Weight Bearing Restrictions  No      Balance Screen   Has the patient fallen in the past 6 months  No    Has the patient had a decrease in activity level because of a fear of falling?   No    Is the patient reluctant to leave their home because of a fear of falling?   No      Home Environment   Additional Comments  4 steps into her house. Can cause her knee pain       Prior Function   Level of Independence  Independent    Vocation  Full time employment    Licensed conveyancer     Leisure  is very active       Cognition   Overall Cognitive Status  Within Functional Limits for tasks assessed    Attention  Focused    Focused Attention   Appears intact    Memory  Appears intact    Awareness  Appears intact    Problem Solving  Appears intact      Sensation   Light Touch  Appears Intact    Additional Comments  denies paratheisas       Coordination   Gross Motor Movements are Fluid and Coordinated  Yes    Fine Motor Movements are Fluid and Coordinated  Yes      ROM / Strength   AROM / PROM / Strength  AROM;PROM;Strength      AROM   Overall AROM Comments  Limited extednal and internal rotation of the left shoulder; normal movement of the right shoulder;       Strength   Overall Strength Comments  4/5 billateral hip flexion; 4+/5 left shoulder strength;     Strength Assessment Site  Hand    Right/Left hand  Right;Left    Right Hand Grip (lbs)  35       [facility] Pre-Surgical Assessment - 02/20/18 0001    5 Meter Walk Test- trial 1  4 sec    5 Meter Walk Test- trial 2  4 sec.     5 Meter Walk Test- trial 3  5 sec.    5 meter walk test average  4.33 sec    4 Stage Balance Test tolerated for:   4 sec.    Sit To Stand Test- trial 1  3 sec.    Comment  limited by knee and back pain. No SOB.     ADL/IADL Independent with:  Bathing;Dressing;Meal prep;Finances    6 Minute Walk- Baseline  yes    BP (mmHg)  137/71    HR (bpm)  67    02 Sat (%RA)  97 %    Modified Borg Scale for Dyspnea  0- Nothing at all    Perceived Rate of Exertion (Borg)  6-    6 Minute Walk Post Test  yes    BP (mmHg)  143/84    HR (bpm)  111    02 Sat (%RA)  97 %    Modified Borg Scale for Dyspnea  2- Mild shortness of breath    Perceived Rate of Exertion (Borg)  11- Fairly light    Aerobic Endurance Distance Walked  655    Endurance additional comments  standing rest break required 2nd to heart rate. HR quickly decreased with standing rest break. 123 max HR with gait  Objective measurements completed on examination: See above findings.              PT Education - 02/20/18 1640    Education Details  purpose of  testing     Person(s) Educated  Patient    Methods  Explanation;Demonstration;Tactile cues;Verbal cues    Comprehension  Verbalized understanding;Returned demonstration;Verbal cues required;Tactile cues required                  Plan - 02/20/18 1642    Clinical Impression Statement  See below   Clinical Presentation  Stable    Clinical Decision Making  Low    Rehab Potential  Good    PT Frequency  One time visit    PT Treatment/Interventions  Patient/family education    Consulted and Agree with Plan of Care  Patient      Clinical Impression Statement: Pt is a  67 yo female presenting to OP PT for evaluation prior to possible TAVR surgery due to severe aortic stenosis. Pt reports onset of dyspnea with ambualtion approximately 1 month ago. Symptoms are limiting ability to walk fast. Pt presents with decreased ROM and strength in her left UE, normal balance and is at low fall risk 4 stage balance test, decreased walking speed and decreased aerobic endurance per 6 minute walk test. Pt ambulated 420 feet in 3:45 before requesting a standing rest beak lasting 20 sec 2nd to increased heart rate. At time of rest, patient's HR was 123  bpm and O2 was 97 on room air. Pt reported 2/10 shortness of breath on modified scale for dyspnea. Pt able to resume after rest and ambulate an additional 235  feet. Pt ambulated a total of 655 feet in 6 minute walk. B/P increased significantly with 6 minute walk test. Based on the Short Physical Performance Battery, patient has a frailty rating of 8/12 with </= 5/12 considered frail.    Patient will benefit from skilled therapeutic intervention in order to improve the following deficits and impairments:  Decreased activity tolerance, Decreased endurance, Pain  Visit Diagnosis: Other abnormalities of gait and mobility     Problem List Patient Active Problem List   Diagnosis Date Noted  . Severe aortic stenosis   . Iron deficiency anemia 10/13/2017  .  Anxiety and depression 04/28/2017  . Encounter for long-term opiate analgesic use 04/28/2017  . Psychophysiological insomnia 04/28/2017  . OA (osteoarthritis) of hip 03/22/2016  . Chronic right hip pain 10/29/2015  . Chronic kidney disease (CKD) stage G3b/A1, moderately decreased glomerular filtration rate (GFR) between 30-44 mL/min/1.73 square meter and albuminuria creatinine ratio less than 30 mg/g (HCC) 06/03/2015  . Type 2 diabetes mellitus ([place]) 11/09/2014  . Diastolic dysfunction, left ventricle 05/16/2013  . Calcific aortic stenosis 05/16/2013  . Gout 05/12/2013  . Diabetic retinopathy ([place]) 12/17/2012  . Hypertension 06/10/2012  . Hyperlipidemia 06/10/2012  . Hypothyroidism 06/10/2012    Carney Living PT DPT  02/20/2018, 4:54 PM     [facility] [address] [place], [name], [address] Phone: [phone]   Fax:  [phone]  Name: ROSELLEN LICHTENBERGER MRN: [id number] Date of Birth: 1950/04/04

## 2018-02-20 NOTE — Telephone Encounter (Signed)
May have this +3 refills 

## 2018-02-22 ENCOUNTER — Telehealth: Payer: Self-pay | Admitting: Family Medicine

## 2018-02-22 NOTE — Telephone Encounter (Signed)
Please review pt's labs 

## 2018-03-08 ENCOUNTER — Other Ambulatory Visit: Payer: Self-pay

## 2018-03-08 ENCOUNTER — Telehealth: Payer: Self-pay | Admitting: Family Medicine

## 2018-03-08 MED ORDER — HYDROCODONE-ACETAMINOPHEN 5-325 MG PO TABS: 0.5000 | ORAL_TABLET | Freq: Three times a day (TID) | ORAL | 0 refills | Status: DC | PRN

## 2018-03-08 NOTE — Telephone Encounter (Signed)
Script printed and signed. Pt aware and daughter in law will pick up

## 2018-03-08 NOTE — Telephone Encounter (Signed)
Pt would like refill on Hydrocodone 5/325. Pt has office visit scheduled in January for pain management. Please advise. Thank you

## 2018-03-08 NOTE — Telephone Encounter (Signed)
Ok times one 

## 2018-03-11 ENCOUNTER — Other Ambulatory Visit: Payer: Self-pay | Admitting: Family Medicine

## 2018-03-12 ENCOUNTER — Other Ambulatory Visit: Payer: Self-pay | Admitting: Family Medicine

## 2018-03-14 ENCOUNTER — Ambulatory Visit: Payer: Medicare Other | Admitting: Family Medicine

## 2018-03-14 ENCOUNTER — Encounter: Payer: Self-pay | Admitting: Family Medicine

## 2018-03-14 VITALS — Temp 98.6°F | Wt 252.8 lb

## 2018-03-14 DIAGNOSIS — E039 Hypothyroidism, unspecified: Secondary | ICD-10-CM

## 2018-03-14 DIAGNOSIS — D6489 Other specified anemias: Secondary | ICD-10-CM

## 2018-03-14 DIAGNOSIS — N289 Disorder of kidney and ureter, unspecified: Secondary | ICD-10-CM

## 2018-03-14 DIAGNOSIS — J019 Acute sinusitis, unspecified: Secondary | ICD-10-CM | POA: Diagnosis not present

## 2018-03-14 MED ORDER — AMOXICILLIN-POT CLAVULANATE 875-125 MG PO TABS: ORAL_TABLET | ORAL | 0 refills | Status: DC

## 2018-03-14 NOTE — Telephone Encounter (Signed)
We did discuss her case with her when she came in today the patient will be doing some lab work with a follow-up medical office visit in January

## 2018-03-14 NOTE — Progress Notes (Signed)
   Subjective:    Patient ID: Sabrina Deleon, female    DOB: Aug 12, 1950, 67 y.o.   MRN: [id number]  Cough  This is a new problem. The current episode started 1 to 4 weeks ago. The cough is productive of sputum. Associated symptoms include rhinorrhea and a sore throat. Pertinent negatives include no chest pain, ear pain, fever, shortness of breath or wheezing. Associated symptoms comments: Congestion,tired and not eating much.  She is stressed about facing the possibility of open heart surgery Feeling stressed about this at this point in time she is holding off on making a decision until at least 6 months   Review of Systems  Constitutional: Negative for activity change and fever.  HENT: Positive for congestion, rhinorrhea and sore throat. Negative for ear pain.   Eyes: Negative for discharge.  Respiratory: Positive for cough. Negative for shortness of breath and wheezing.   Cardiovascular: Negative for chest pain.       Objective:   Physical Exam Vitals signs and nursing note reviewed.  Constitutional:      Appearance: She is well-developed.  HENT:     Head: Normocephalic.     Nose: Nose normal.     Mouth/Throat:     Pharynx: No oropharyngeal exudate.  Neck:     Musculoskeletal: Neck supple.  Cardiovascular:     Rate and Rhythm: Normal rate.     Heart sounds: Normal heart sounds. No murmur.  Pulmonary:     Effort: Pulmonary effort is normal.     Breath sounds: Normal breath sounds. No wheezing.  Lymphadenopathy:     Cervical: No cervical adenopathy.  Skin:    General: Skin is warm and dry.           Assessment & Plan:  She will do her lab work and follow-up for chronic health visit in January  Acute rhinosinusitis antibiotic prescribed warning signs discussed follow-up if ongoing troubles

## 2018-03-27 DIAGNOSIS — N289 Disorder of kidney and ureter, unspecified: Secondary | ICD-10-CM | POA: Diagnosis not present

## 2018-03-27 DIAGNOSIS — E039 Hypothyroidism, unspecified: Secondary | ICD-10-CM | POA: Diagnosis not present

## 2018-03-27 DIAGNOSIS — D6489 Other specified anemias: Secondary | ICD-10-CM | POA: Diagnosis not present

## 2018-03-28 LAB — CBC WITH DIFFERENTIAL/PLATELET
Basophils Absolute: 0.1 10*3/uL (ref 0.0–0.2)
Basos: 1 %
EOS (ABSOLUTE): 0.4 10*3/uL (ref 0.0–0.4)
Eos: 5 %
HEMOGLOBIN: 11.2 g/dL (ref 11.1–15.9)
Hematocrit: 33.2 % — ABNORMAL LOW (ref 34.0–46.6)
IMMATURE GRANULOCYTES: 0 %
Immature Grans (Abs): 0 10*3/uL (ref 0.0–0.1)
LYMPHS ABS: 1.7 10*3/uL (ref 0.7–3.1)
Lymphs: 24 %
MCH: 29.9 pg (ref 26.6–33.0)
MCHC: 33.7 g/dL (ref 31.5–35.7)
MCV: 89 fL (ref 79–97)
MONOCYTES: 9 %
MONOS ABS: 0.6 10*3/uL (ref 0.1–0.9)
NEUTROS PCT: 61 %
Neutrophils Absolute: 4.2 10*3/uL (ref 1.4–7.0)
Platelets: 225 10*3/uL (ref 150–450)
RBC: 3.75 x10E6/uL — AB (ref 3.77–5.28)
RDW: 13.5 % (ref 11.7–15.4)
WBC: 6.9 10*3/uL (ref 3.4–10.8)

## 2018-03-28 LAB — BASIC METABOLIC PANEL
BUN/Creatinine Ratio: 22 (ref 12–28)
BUN: 36 mg/dL — ABNORMAL HIGH (ref 8–27)
CO2: 23 mmol/L (ref 20–29)
Calcium: 9.6 mg/dL (ref 8.7–10.3)
Chloride: 100 mmol/L (ref 96–106)
Creatinine, Ser: 1.64 mg/dL — ABNORMAL HIGH (ref 0.57–1.00)
GFR calc Af Amer: 37 mL/min/{1.73_m2} — ABNORMAL LOW (ref >59)
GFR calc non Af Amer: 32 mL/min/{1.73_m2} — ABNORMAL LOW (ref >59)
Glucose: 81 mg/dL (ref 65–99)
POTASSIUM: 4.2 mmol/L (ref 3.5–5.2)
SODIUM: 138 mmol/L (ref 134–144)

## 2018-03-28 LAB — TSH: TSH: 1.66 u[IU]/mL (ref 0.450–4.500)

## 2018-03-29 ENCOUNTER — Ambulatory Visit (INDEPENDENT_AMBULATORY_CARE_PROVIDER_SITE_OTHER): Payer: Medicare Other | Admitting: Family Medicine

## 2018-03-29 ENCOUNTER — Encounter: Payer: Self-pay | Admitting: Family Medicine

## 2018-03-29 VITALS — BP 102/60 | Ht 68.0 in | Wt 250.0 lb

## 2018-03-29 DIAGNOSIS — E785 Hyperlipidemia, unspecified: Secondary | ICD-10-CM | POA: Diagnosis not present

## 2018-03-29 DIAGNOSIS — I10 Essential (primary) hypertension: Secondary | ICD-10-CM

## 2018-03-29 DIAGNOSIS — N289 Disorder of kidney and ureter, unspecified: Secondary | ICD-10-CM

## 2018-03-29 DIAGNOSIS — E119 Type 2 diabetes mellitus without complications: Secondary | ICD-10-CM

## 2018-03-29 DIAGNOSIS — E113299 Type 2 diabetes mellitus with mild nonproliferative diabetic retinopathy without macular edema, unspecified eye: Secondary | ICD-10-CM

## 2018-03-29 DIAGNOSIS — E039 Hypothyroidism, unspecified: Secondary | ICD-10-CM | POA: Diagnosis not present

## 2018-03-29 DIAGNOSIS — N183 Chronic kidney disease, stage 3 (moderate): Secondary | ICD-10-CM

## 2018-03-29 DIAGNOSIS — N1832 Chronic kidney disease, stage 3b: Secondary | ICD-10-CM

## 2018-03-29 HISTORY — DX: Morbid (severe) obesity due to excess calories: E66.01

## 2018-03-29 LAB — POCT GLYCOSYLATED HEMOGLOBIN (HGB A1C): HEMOGLOBIN A1C: 6 % — AB (ref 4.0–5.6)

## 2018-03-29 MED ORDER — ZOSTER VAC RECOMB ADJUVANTED 50 MCG/0.5ML IM SUSR: 0.5000 mL | Freq: Once | INTRAMUSCULAR | 1 refills | Status: AC

## 2018-03-29 MED ORDER — HYDROCODONE-ACETAMINOPHEN 5-325 MG PO TABS: 0.5000 | ORAL_TABLET | Freq: Three times a day (TID) | ORAL | 0 refills | Status: DC | PRN

## 2018-03-29 NOTE — Patient Instructions (Addendum)
Results for orders placed or performed in visit on 03/29/18  POCT glycosylated hemoglobin (Hb A1C)  Result Value Ref Range   Hemoglobin A1C 6.0 (A) 4.0 - 5.6 %   HbA1c POC (<> result, manual entry)     HbA1c, POC (prediabetic range)     HbA1c, POC (controlled diabetic range)     Shingrix and shingles prevention: know the facts!   Shingrix is a very effective vaccine to prevent shingles.   Shingles is a reactivation of chickenpox -more than 99% of Americans born before 1980 have had chickenpox even if they do not remember it. One in every 10 people who get shingles have severe long-lasting nerve pain as a result.   33 out of a 100 older adults will get shingles if they are unvaccinated.     This vaccine is very important for your health This vaccine is indicated for anyone 50 years or older. You can get this vaccine even if you have already had shingles because you can get the disease more than once in a lifetime.  Your risk for shingles and its complications increases with age.  This vaccine has 2 doses.  The second dose would be 2 to 6 months after the first dose.  If you had Zostavax vaccine in the past you should still get Shingrix. ( Zostavax is only 70% effective and it loses significant strength over a few years .)  This vaccine is given through the pharmacy.  The cost of the vaccine is through your insurance. The pharmacy can inform you of the total costs.  Common side effects including soreness in the arm, some redness and swelling, also some feel fatigue muscle soreness headache low-grade fever.  Side effects typically go away within 2 to 3 days. Remember-the pain from shingles can last a lifetime but these side effects of the vaccine will only last a few days at most. It is very important to get both doses in order to protect yourself fully.   Please get this vaccine at your earliest convenience at your trusted pharmacy.

## 2018-03-29 NOTE — Progress Notes (Addendum)
Subjective:    Patient ID: Sabrina Deleon, female    DOB: August 26, 1950, 68 y.o.   MRN: [id number]  HPI  Patient is here today for follow up on her chronic health issues.She has diabetes and takes Glipizide 5 mg 1/2 daily. Med list says 1/2 bid.She eats healthy and gets exercise.She does see a Film/video editor Dr. Cyndia Bent.  This patient was seen today for chronic pain  The medication list was reviewed and updated.   -Compliance with medication: Yes  - Number patient states they take daily: 0-1  -when was the last dose patient took? 1/2 two days ago.  The patient was advised the importance of maintaining medication and not using illegal substances with these.  Here for refills and follow up  The patient was educated that we can provide 3 monthly scripts for their medication, it is their responsibility to follow the instructions.  Side effects or complications from medications:none  Patient is aware that pain medications are meant to minimize the severity of the pain to allow their pain levels to improve to allow for better function. They are aware of that pain medications cannot totally remove their pain.  Due for UDT ( at least once per year) : Do not see that she has had one.  Patient for blood pressure check up.  The patient does have hypertension.  The patient is on medication.  Patient relates compliance with meds. Todays BP reviewed with the patient. Patient denies issues with medication. Patient relates reasonable diet. Patient tries to minimize salt. Patient aware of BP goals.  The patient's BMI is calculated.  The patient does have obesity.  The patient does try to some degree staying active and watching diet.  It is in the vital signs and acknowledged.  It is above the recommended BMI for the patient's height and weight.  The patient has been counseled regarding healthy diet, restricted portions, avoiding excessive carbohydrates/sugary foods, and increase physical activity as health  permits.  It is in the patient's best interest to lower the risk of secondary illness including heart disease strokes and cancer by losing weight.  The patient acknowledges this information.  The patient was seen today as part of a comprehensive diabetic check up.the patient does have diabetes.  The patient follows here on a regular basis.  The patient relates medication compliance. No significant side effects to the medications. Denies any low glucose spells. Relates compliance with diet to a reasonable level. Patient does do labwork intermittently and understands the dangers of diabetes.       Results for orders placed or performed in visit on 03/29/18  POCT glycosylated hemoglobin (Hb A1C)  Result Value Ref Range   Hemoglobin A1C 6.0 (A) 4.0 - 5.6 %   HbA1c POC (<> result, manual entry)     HbA1c, POC (prediabetic range)     HbA1c, POC (controlled diabetic range)     Please see serum labs which were drawn earlier this month TSH looks good Hemoglobin 11.2 Creatinine 1.64 GFR 32  Review of Systems  Constitutional: Negative for activity change, appetite change and fatigue.  HENT: Negative for congestion and rhinorrhea.   Respiratory: Negative for cough and shortness of breath.   Cardiovascular: Negative for chest pain and leg swelling.  Gastrointestinal: Negative for abdominal pain and diarrhea.  Endocrine: Negative for polydipsia and polyphagia.  Skin: Negative for color change.  Neurological: Negative for dizziness and weakness.  Psychiatric/Behavioral: Negative for behavioral problems and confusion.  Objective:   Physical Exam Vitals signs reviewed.  Constitutional:      General: She is not in acute distress. HENT:     Head: Normocephalic and atraumatic.  Eyes:     General:        Right eye: No discharge.        Left eye: No discharge.  Neck:     Trachea: No tracheal deviation.  Cardiovascular:     Rate and Rhythm: Normal rate and regular rhythm.     Heart  sounds: Normal heart sounds. No murmur.  Pulmonary:     Effort: Pulmonary effort is normal. No respiratory distress.     Breath sounds: Normal breath sounds.  Lymphadenopathy:     Cervical: No cervical adenopathy.  Skin:    General: Skin is warm and dry.  Neurological:     Mental Status: She is alert.     Coordination: Coordination normal.  Psychiatric:        Behavior: Behavior normal.           Assessment & Plan:  Diabetes decent control continue current measures watch diet try to lose weight  Morbid obesity try to watch diet stay active try to lose weight  Blood pressure good control currently no adjustments necessary currently patient is on lisinopril creatinine is elevated but I believe the lisinopril is lessening the risk of diabetic kidney issues  Insomnia-patient has tried 5 mg Ambien in the past and it did not help the patient was warned not to take pain medicine at the same time  Morbid obesity very important with comorbid factors that she gets her weight under better control  Diabetic retinopathy being followed by eye specialist stable currently  GERD-patient uses a pantoprazole every other day to help keep it under control  Hyperlipidemia previous labs reviewed continue current medication  Gout-under good control with medication  Chronic kidney disease associated with diabetes hypertension and age referral to nephrology- it is possible that they will decrease or stop her allopurinol or possibly lisinopril but we will get their input  25 minutes was spent with the patient.  This statement verifies that 25 minutes was indeed spent with the patient.  More than 50% of this visit-total duration of the visit-was spent in counseling and coordination of care. The issues that the patient came in for today as reflected in the diagnosis (s) please refer to documentation for further details. Diabetic foot exam completed

## 2018-04-08 ENCOUNTER — Encounter: Payer: Self-pay | Admitting: Family Medicine

## 2018-04-22 ENCOUNTER — Other Ambulatory Visit: Payer: Self-pay | Admitting: Family Medicine

## 2018-05-16 ENCOUNTER — Other Ambulatory Visit: Payer: Self-pay | Admitting: Family Medicine

## 2018-05-24 ENCOUNTER — Ambulatory Visit: Payer: Medicare Other | Admitting: Family Medicine

## 2018-05-26 ENCOUNTER — Other Ambulatory Visit: Payer: Self-pay | Admitting: Family Medicine

## 2018-05-26 ENCOUNTER — Encounter: Payer: Self-pay | Admitting: Family Medicine

## 2018-05-27 ENCOUNTER — Other Ambulatory Visit: Payer: Self-pay | Admitting: Family Medicine

## 2018-06-21 ENCOUNTER — Other Ambulatory Visit: Payer: Self-pay | Admitting: Family Medicine

## 2018-06-23 NOTE — Telephone Encounter (Signed)
May have 1 refill 

## 2018-07-18 ENCOUNTER — Other Ambulatory Visit: Payer: Self-pay | Admitting: Family Medicine

## 2018-07-18 ENCOUNTER — Telehealth: Payer: Self-pay | Admitting: Family Medicine

## 2018-07-18 MED ORDER — ZOLPIDEM TARTRATE 10 MG PO TABS: 10.0000 mg | ORAL_TABLET | Freq: Every day | ORAL | 0 refills | Status: DC

## 2018-07-18 NOTE — Telephone Encounter (Signed)
Refill of Ambien to [facility] on [address]. (Can't send this to [facility])

## 2018-07-18 NOTE — Telephone Encounter (Signed)
1 refill was sent into [facility] [address] Patient needs to do a virtual visit in May

## 2018-07-22 NOTE — Telephone Encounter (Signed)
Left msg for patient to call us to schedule an appt for sometime in May.

## 2018-08-02 ENCOUNTER — Ambulatory Visit: Payer: Medicare Other | Admitting: Family Medicine

## 2018-08-21 ENCOUNTER — Other Ambulatory Visit: Payer: Self-pay | Admitting: Family Medicine

## 2018-08-24 ENCOUNTER — Other Ambulatory Visit: Payer: Self-pay | Admitting: Family Medicine

## 2018-09-02 ENCOUNTER — Other Ambulatory Visit: Payer: Self-pay | Admitting: Family Medicine

## 2018-09-03 ENCOUNTER — Telehealth: Payer: Self-pay | Admitting: *Deleted

## 2018-09-03 NOTE — Telephone Encounter (Signed)
Requesting refill on hydrocodone. Last filled march. Has appt with carolyn 09/11/18  [facility] [address]

## 2018-09-04 ENCOUNTER — Telehealth: Payer: Self-pay | Admitting: *Deleted

## 2018-09-04 ENCOUNTER — Other Ambulatory Visit: Payer: Self-pay | Admitting: Family Medicine

## 2018-09-04 DIAGNOSIS — I10 Essential (primary) hypertension: Secondary | ICD-10-CM

## 2018-09-04 DIAGNOSIS — E1122 Type 2 diabetes mellitus with diabetic chronic kidney disease: Secondary | ICD-10-CM

## 2018-09-04 DIAGNOSIS — D509 Iron deficiency anemia, unspecified: Secondary | ICD-10-CM

## 2018-09-04 DIAGNOSIS — Z79899 Other long term (current) drug therapy: Secondary | ICD-10-CM

## 2018-09-04 DIAGNOSIS — N183 Chronic kidney disease, stage 3 unspecified: Secondary | ICD-10-CM

## 2018-09-04 DIAGNOSIS — E785 Hyperlipidemia, unspecified: Secondary | ICD-10-CM

## 2018-09-04 DIAGNOSIS — E039 Hypothyroidism, unspecified: Secondary | ICD-10-CM

## 2018-09-04 MED ORDER — HYDROCODONE-ACETAMINOPHEN 5-325 MG PO TABS: 0.5000 | ORAL_TABLET | Freq: Three times a day (TID) | ORAL | 0 refills | Status: DC | PRN

## 2018-09-04 NOTE — Telephone Encounter (Signed)
Pt would like bw orders put in to do before her appt with carolyn next Wednesday. Pt would like dr Nicki Reaper to order since she wants to go this Friday to lab.  Last labs 03/27/18 tsh, cbc, bmp

## 2018-09-04 NOTE — Telephone Encounter (Signed)
Pt.notified

## 2018-09-04 NOTE — Telephone Encounter (Signed)
Prescription was refilled. Please notify patient hydrocodone was sent in  I will be sending a copy of this message to [place] as well When the patient comes in for her visit with Hoyle Sauer- I highly recommend that the Ambien be reduced to 5 mg daily because of her age-I had sent her a letter indicating that we would need to cut back on the dose of this earlier this spring thank you

## 2018-09-04 NOTE — Telephone Encounter (Signed)
Orders put in. Pt notified.  

## 2018-09-04 NOTE — Telephone Encounter (Signed)
I1C, metabolic 7, lipid, liver, TSH, CBC, urine ACR- diabetes hyperlipidemia hypothyroidism anemia renal insufficiency

## 2018-09-06 DIAGNOSIS — E785 Hyperlipidemia, unspecified: Secondary | ICD-10-CM | POA: Diagnosis not present

## 2018-09-06 DIAGNOSIS — I10 Essential (primary) hypertension: Secondary | ICD-10-CM | POA: Diagnosis not present

## 2018-09-06 DIAGNOSIS — E1122 Type 2 diabetes mellitus with diabetic chronic kidney disease: Secondary | ICD-10-CM | POA: Diagnosis not present

## 2018-09-06 DIAGNOSIS — Z79899 Other long term (current) drug therapy: Secondary | ICD-10-CM | POA: Diagnosis not present

## 2018-09-06 DIAGNOSIS — N183 Chronic kidney disease, stage 3 (moderate): Secondary | ICD-10-CM | POA: Diagnosis not present

## 2018-09-06 NOTE — Telephone Encounter (Signed)
Noted. Thanks.

## 2018-09-07 LAB — HEPATIC FUNCTION PANEL
ALT: 13 IU/L (ref 0–32)
AST: 36 IU/L (ref 0–40)
Albumin: 4.4 g/dL (ref 3.8–4.8)
Alkaline Phosphatase: 59 IU/L (ref 39–117)
Bilirubin Total: 0.4 mg/dL (ref 0.0–1.2)
Bilirubin, Direct: 0.12 mg/dL (ref 0.00–0.40)
Total Protein: 6.7 g/dL (ref 6.0–8.5)

## 2018-09-07 LAB — BASIC METABOLIC PANEL
BUN/Creatinine Ratio: 22 (ref 12–28)
BUN: 34 mg/dL — ABNORMAL HIGH (ref 8–27)
CO2: 23 mmol/L (ref 20–29)
Calcium: 9.4 mg/dL (ref 8.7–10.3)
Chloride: 99 mmol/L (ref 96–106)
Creatinine, Ser: 1.52 mg/dL — ABNORMAL HIGH (ref 0.57–1.00)
GFR calc Af Amer: 41 mL/min/{1.73_m2} — ABNORMAL LOW (ref >59)
GFR calc non Af Amer: 35 mL/min/{1.73_m2} — ABNORMAL LOW (ref >59)
Glucose: 96 mg/dL (ref 65–99)
Potassium: 4.7 mmol/L (ref 3.5–5.2)
Sodium: 135 mmol/L (ref 134–144)

## 2018-09-07 LAB — LIPID PANEL
Chol/HDL Ratio: 4.4 ratio (ref 0.0–4.4)
Cholesterol, Total: 149 mg/dL (ref 100–199)
HDL: 34 mg/dL — ABNORMAL LOW (ref >39)
LDL Calculated: 89 mg/dL (ref 0–99)
Triglycerides: 131 mg/dL (ref 0–149)
VLDL Cholesterol Cal: 26 mg/dL (ref 5–40)

## 2018-09-07 LAB — CBC WITH DIFFERENTIAL/PLATELET
Basophils Absolute: 0.1 10*3/uL (ref 0.0–0.2)
Basos: 1 %
EOS (ABSOLUTE): 0.4 10*3/uL (ref 0.0–0.4)
Eos: 6 %
Hematocrit: 32.2 % — ABNORMAL LOW (ref 34.0–46.6)
Hemoglobin: 11.1 g/dL (ref 11.1–15.9)
Immature Grans (Abs): 0 10*3/uL (ref 0.0–0.1)
Immature Granulocytes: 0 %
Lymphocytes Absolute: 1.7 10*3/uL (ref 0.7–3.1)
Lymphs: 25 %
MCH: 30.2 pg (ref 26.6–33.0)
MCHC: 34.5 g/dL (ref 31.5–35.7)
MCV: 88 fL (ref 79–97)
Monocytes Absolute: 0.6 10*3/uL (ref 0.1–0.9)
Monocytes: 10 %
Neutrophils Absolute: 3.9 10*3/uL (ref 1.4–7.0)
Neutrophils: 58 %
Platelets: 201 10*3/uL (ref 150–450)
RBC: 3.68 x10E6/uL — ABNORMAL LOW (ref 3.77–5.28)
RDW: 13.6 % (ref 11.7–15.4)
WBC: 6.7 10*3/uL (ref 3.4–10.8)

## 2018-09-07 LAB — MICROALBUMIN / CREATININE URINE RATIO
Creatinine, Urine: 65.9 mg/dL
Microalb/Creat Ratio: <5 mg/g creat (ref 0–29)
Microalbumin, Urine: <3 ug/mL

## 2018-09-07 LAB — HEMOGLOBIN A1C
Est. average glucose Bld gHb Est-mCnc: 140 mg/dL
Hgb A1c MFr Bld: 6.5 % — ABNORMAL HIGH (ref 4.8–5.6)

## 2018-09-07 LAB — TSH: TSH: 4.41 u[IU]/mL (ref 0.450–4.500)

## 2018-09-11 ENCOUNTER — Ambulatory Visit (INDEPENDENT_AMBULATORY_CARE_PROVIDER_SITE_OTHER): Payer: Medicare Other | Admitting: Nurse Practitioner

## 2018-09-11 ENCOUNTER — Other Ambulatory Visit: Payer: Self-pay

## 2018-09-11 VITALS — BP 120/78 | Temp 98.4°F | Wt 258.6 lb

## 2018-09-11 DIAGNOSIS — F5104 Psychophysiologic insomnia: Secondary | ICD-10-CM | POA: Diagnosis not present

## 2018-09-11 DIAGNOSIS — M25511 Pain in right shoulder: Secondary | ICD-10-CM | POA: Diagnosis not present

## 2018-09-11 DIAGNOSIS — M150 Primary generalized (osteo)arthritis: Secondary | ICD-10-CM

## 2018-09-11 DIAGNOSIS — M25512 Pain in left shoulder: Secondary | ICD-10-CM

## 2018-09-11 DIAGNOSIS — E1122 Type 2 diabetes mellitus with diabetic chronic kidney disease: Secondary | ICD-10-CM

## 2018-09-11 DIAGNOSIS — G8929 Other chronic pain: Secondary | ICD-10-CM

## 2018-09-11 DIAGNOSIS — N183 Chronic kidney disease, stage 3 (moderate): Secondary | ICD-10-CM

## 2018-09-11 DIAGNOSIS — M159 Polyosteoarthritis, unspecified: Secondary | ICD-10-CM

## 2018-09-11 DIAGNOSIS — E039 Hypothyroidism, unspecified: Secondary | ICD-10-CM

## 2018-09-12 ENCOUNTER — Encounter: Payer: Self-pay | Admitting: Nurse Practitioner

## 2018-09-12 DIAGNOSIS — M159 Polyosteoarthritis, unspecified: Secondary | ICD-10-CM | POA: Insufficient documentation

## 2018-09-12 MED ORDER — LEVOTHYROXINE SODIUM 137 MCG PO TABS: 137.0000 ug | ORAL_TABLET | Freq: Every day | ORAL | 0 refills | Status: DC

## 2018-09-12 NOTE — Progress Notes (Addendum)
Subjective: Presents for follow-up on several chronic health issues.  Is struggling daily with significant pain which is getting worse.  Her main concern is bilateral shoulder pain which greatly limits her movements.  Also having some knee pain.  Her osteoarthritis pain in multiple joints has begun to limit her activities of daily living.  Tries to take minimal hydrocodone 5 mg, only takes a half a tab at a time.  At the most may take a half a tab twice a day.  This does bring the pain level down to a tolerable level where she can still function.  Has some pain in her toes occasionally.  The insides of both lower legs will hurt be sensitive to touch at times.  Has gained some weight.  Takes her Protonix every other day which controls her reflux.  Blood sugars have run 1 25-1 35 at different times of the day.  Has been instructed to cut back her Ambien 10 mg to half a tab (5 mg).  Patient has done this for the past several days and it has not gone well for her sleep.  No chest pain/ischemic type pain or shortness of breath.  Occasional swelling in the lower legs. PMP reviewed.   Objective:   BP 120/78   Temp 98.4 F (36.9 C)   Wt 258 lb 9.6 oz (117.3 kg)   BMI 39.32 kg/m  NAD.  Alert, oriented.  Lungs clear.  Heart regular rate and rhythm.  Carotids no bruits or thrills.  Lower extremities minimal edema. Diabetic Foot Exam - Simple   Simple Foot Form Diabetic Foot exam was performed with the following findings: Yes 09/11/2018  3:00 PM  Visual Inspection No deformities, no ulcerations, no other skin breakdown bilaterally: Yes Sensation Testing Intact to touch and monofilament testing bilaterally: Yes Pulse Check See comments: Yes Comments DP pulses present bilaterally.  Toes warm with normal capillary refill.  Significant bunions noted bilaterally.  Has hammertoes on multiple toes of both feet.  A few calluses noted but skin intact.    Recent Results (from the past 2160 hour(s))  Lipid panel      Status: Abnormal   Collection Time: 09/06/18 11:39 AM  Result Value Ref Range   Cholesterol, Total 149 100 - 199 mg/dL   Triglycerides 131 0 - 149 mg/dL   HDL 34 (L) >39 mg/dL   VLDL Cholesterol Cal 26 5 - 40 mg/dL   LDL Calculated 89 0 - 99 mg/dL   Chol/HDL Ratio 4.4 0.0 - 4.4 ratio    Comment:                                   T. Chol/HDL Ratio                                             Men  Women                               1/2 Avg.Risk  3.4    3.3                                   Avg.Risk  5.0    4.4  2X Avg.Risk  9.6    7.1                                3X Avg.Risk 23.4   11.0   Hepatic function panel     Status: None   Collection Time: 09/06/18 11:39 AM  Result Value Ref Range   Total Protein 6.7 6.0 - 8.5 g/dL   Albumin 4.4 3.8 - 4.8 g/dL   Bilirubin Total 0.4 0.0 - 1.2 mg/dL   Bilirubin, Direct 0.12 0.00 - 0.40 mg/dL   Alkaline Phosphatase 59 39 - 117 IU/L   AST 36 0 - 40 IU/L   ALT 13 0 - 32 IU/L  Basic metabolic panel     Status: Abnormal   Collection Time: 09/06/18 11:39 AM  Result Value Ref Range   Glucose 96 65 - 99 mg/dL   BUN 34 (H) 8 - 27 mg/dL   Creatinine, Ser 1.52 (H) 0.57 - 1.00 mg/dL   GFR calc non Af Amer 35 (L) >59 mL/min/1.73   GFR calc Af Amer 41 (L) >59 mL/min/1.73   BUN/Creatinine Ratio 22 12 - 28   Sodium 135 134 - 144 mmol/L   Potassium 4.7 3.5 - 5.2 mmol/L   Chloride 99 96 - 106 mmol/L   CO2 23 20 - 29 mmol/L   Calcium 9.4 8.7 - 10.3 mg/dL  Hemoglobin A1c     Status: Abnormal   Collection Time: 09/06/18 11:39 AM  Result Value Ref Range   Hgb A1c MFr Bld 6.5 (H) 4.8 - 5.6 %    Comment:          Prediabetes: 5.7 - 6.4          Diabetes: >6.4          Glycemic control for adults with diabetes: <7.0    Est. average glucose Bld gHb Est-mCnc 140 mg/dL  Microalbumin / creatinine urine ratio     Status: None   Collection Time: 09/06/18 11:39 AM  Result Value Ref Range   Creatinine, Urine 65.9 Not Estab.  mg/dL   Microalbumin, Urine <3.0 Not Estab. ug/mL    Comment: **Verified by repeat analysis**   Microalb/Creat Ratio <5 0 - 29 mg/g creat    Comment:                        Normal:                0 -  29                        Moderately increased: 30 - 300                        Severely increased:       >300               **Please note reference interval change**   CBC with Differential/Platelet     Status: Abnormal   Collection Time: 09/06/18 11:39 AM  Result Value Ref Range   WBC 6.7 3.4 - 10.8 x10E3/uL   RBC 3.68 (L) 3.77 - 5.28 x10E6/uL   Hemoglobin 11.1 11.1 - 15.9 g/dL   Hematocrit 32.2 (L) 34.0 - 46.6 %   MCV 88 79 - 97 fL   MCH 30.2 26.6 - 33.0 pg   MCHC 34.5 31.5 - 35.7 g/dL  RDW 13.6 11.7 - 15.4 %   Platelets 201 150 - 450 x10E3/uL   Neutrophils 58 Not Estab. %   Lymphs 25 Not Estab. %   Monocytes 10 Not Estab. %   Eos 6 Not Estab. %   Basos 1 Not Estab. %   Neutrophils Absolute 3.9 1.4 - 7.0 x10E3/uL   Lymphocytes Absolute 1.7 0.7 - 3.1 x10E3/uL   Monocytes Absolute 0.6 0.1 - 0.9 x10E3/uL   EOS (ABSOLUTE) 0.4 0.0 - 0.4 x10E3/uL   Basophils Absolute 0.1 0.0 - 0.2 x10E3/uL   Immature Granulocytes 0 Not Estab. %   Immature Grans (Abs) 0.0 0.0 - 0.1 x10E3/uL  TSH     Status: None   Collection Time: 09/06/18 11:39 AM  Result Value Ref Range   TSH 4.410 0.450 - 4.500 uIU/mL   Reviewed recent labs with patient.  Assessment:   Problem List Items Addressed This Visit      Endocrine   Hypothyroidism - Primary   Relevant Medications   levothyroxine (SYNTHROID) 137 MCG tablet   Type 2 diabetes mellitus (HCC)     Musculoskeletal and Integument   Primary osteoarthritis involving multiple joints     Other   Psychophysiological insomnia    Other Visit Diagnoses    Chronic pain of both shoulders            Plan:   Meds ordered this encounter  Medications  . levothyroxine (SYNTHROID) 137 MCG tablet    Sig: Take 1 tablet (137 mcg total) by mouth daily  before breakfast.    Dispense:  90 tablet    Refill:  0    Order Specific Question:   Supervising Provider    Answer:   Sallee Lange A [[id number]]   Increase levothyroxine dose to 137 mcg and repeat TSH in 8 weeks.  Goal is for the TSH to be between 1-2. Encourage patient to follow-up with orthopedic specialist regarding her chronic shoulder pain.  States she has had an injection before which seemed to help.  Patient can take her hydrocodone 5 mg half tab up to 3 times a day for severe pain. Encouraged healthy diet and weight loss.  Continue current medications as directed. Decrease Ambien to 5 mg as previously instructed by Dr. Nicki Reaper. Recommend wellness exam this fall. Return in about 3 months (around 12/12/2018) for recheck. 25 minutes was spent with the patient.  This statement verifies that 25 minutes was indeed spent with the patient.  More than 50% of this visit-total duration of the visit-was spent in counseling and coordination of care. The issues that the patient came in for today as reflected in the diagnosis (s) please refer to documentation for further details.

## 2018-10-01 DIAGNOSIS — I739 Peripheral vascular disease, unspecified: Secondary | ICD-10-CM | POA: Diagnosis not present

## 2018-10-04 ENCOUNTER — Other Ambulatory Visit: Payer: Self-pay | Admitting: Family Medicine

## 2018-10-08 ENCOUNTER — Telehealth: Payer: Self-pay | Admitting: Family Medicine

## 2018-10-08 MED ORDER — ZOLPIDEM TARTRATE 5 MG PO TABS: ORAL_TABLET | ORAL | 0 refills | Status: DC

## 2018-10-08 NOTE — Addendum Note (Signed)
Addended by: [name] on: 10/08/2018 03:15 PM   Modules accepted: Orders

## 2018-10-08 NOTE — Telephone Encounter (Signed)
Sabrina Deleon Previously I had recommended to the patient to reduce her Ambien from 10 mg to 5 mg Mainly because it is the medication that is not recommended at her age and the lowest dose would be the safest dose if in fact we have to use the medicine So given this I recommend 5 mg each evening and the patient at least try this over the course of the next month Please check the patient's willingness to do so It would be fine to do a month at 5 mg then the patient can give [place] feedback in 3 to 4 weeks how that is going

## 2018-10-08 NOTE — Telephone Encounter (Signed)
Denied this prescription 5 mg prescription was sent in

## 2018-10-08 NOTE — Telephone Encounter (Signed)
Pt requesting refill on Ambien 10 mg tablet. Pt last seen 09/11/2018 for hypothyroidism. Please advise. Thank you

## 2018-10-08 NOTE — Telephone Encounter (Signed)
ambien 5 mg sent to pharmacy. Upon review of last office visit with Brantley Fling states to continue treatment recommended by Dr.Scott which is 5 mg. Pt voicemail is full unable to leave message.

## 2018-10-11 ENCOUNTER — Other Ambulatory Visit: Payer: Self-pay | Admitting: Family Medicine

## 2018-10-11 ENCOUNTER — Telehealth: Payer: Self-pay | Admitting: *Deleted

## 2018-10-11 MED ORDER — HYDROCODONE-ACETAMINOPHEN 5-325 MG PO TABS: 0.5000 | ORAL_TABLET | Freq: Three times a day (TID) | ORAL | 0 refills | Status: DC | PRN

## 2018-10-11 NOTE — Telephone Encounter (Signed)
Pt has been notifed 

## 2018-10-11 NOTE — Telephone Encounter (Signed)
Both scripts were sent in

## 2018-10-11 NOTE — Telephone Encounter (Signed)
Pt had med check up on 09/11/18 and only one script of hydrocodone was sent to pharm. Can she get the other two scripts sent to [facility] [address]. She completely out. Take one half tablet every 8 hours of hydrocodone 5/325 #45

## 2018-10-14 ENCOUNTER — Other Ambulatory Visit: Payer: Self-pay

## 2018-10-14 MED ORDER — GLUCOSE BLOOD VI STRP: 1.0000 | ORAL_STRIP | 0 refills | Status: DC | PRN

## 2018-10-17 ENCOUNTER — Other Ambulatory Visit: Payer: Self-pay | Admitting: *Deleted

## 2018-10-17 ENCOUNTER — Other Ambulatory Visit: Payer: Self-pay | Admitting: Family Medicine

## 2018-10-17 ENCOUNTER — Telehealth: Payer: Self-pay | Admitting: Family Medicine

## 2018-10-17 IMAGING — DX DG PORTABLE PELVIS
1 series · 1 of 1 positions shown · non-contrast
Comparison: None.

CLINICAL DATA: Left anterior total hip replacement.

EXAM:
PORTABLE PELVIS 1-2 VIEWS

[pelvis ap]
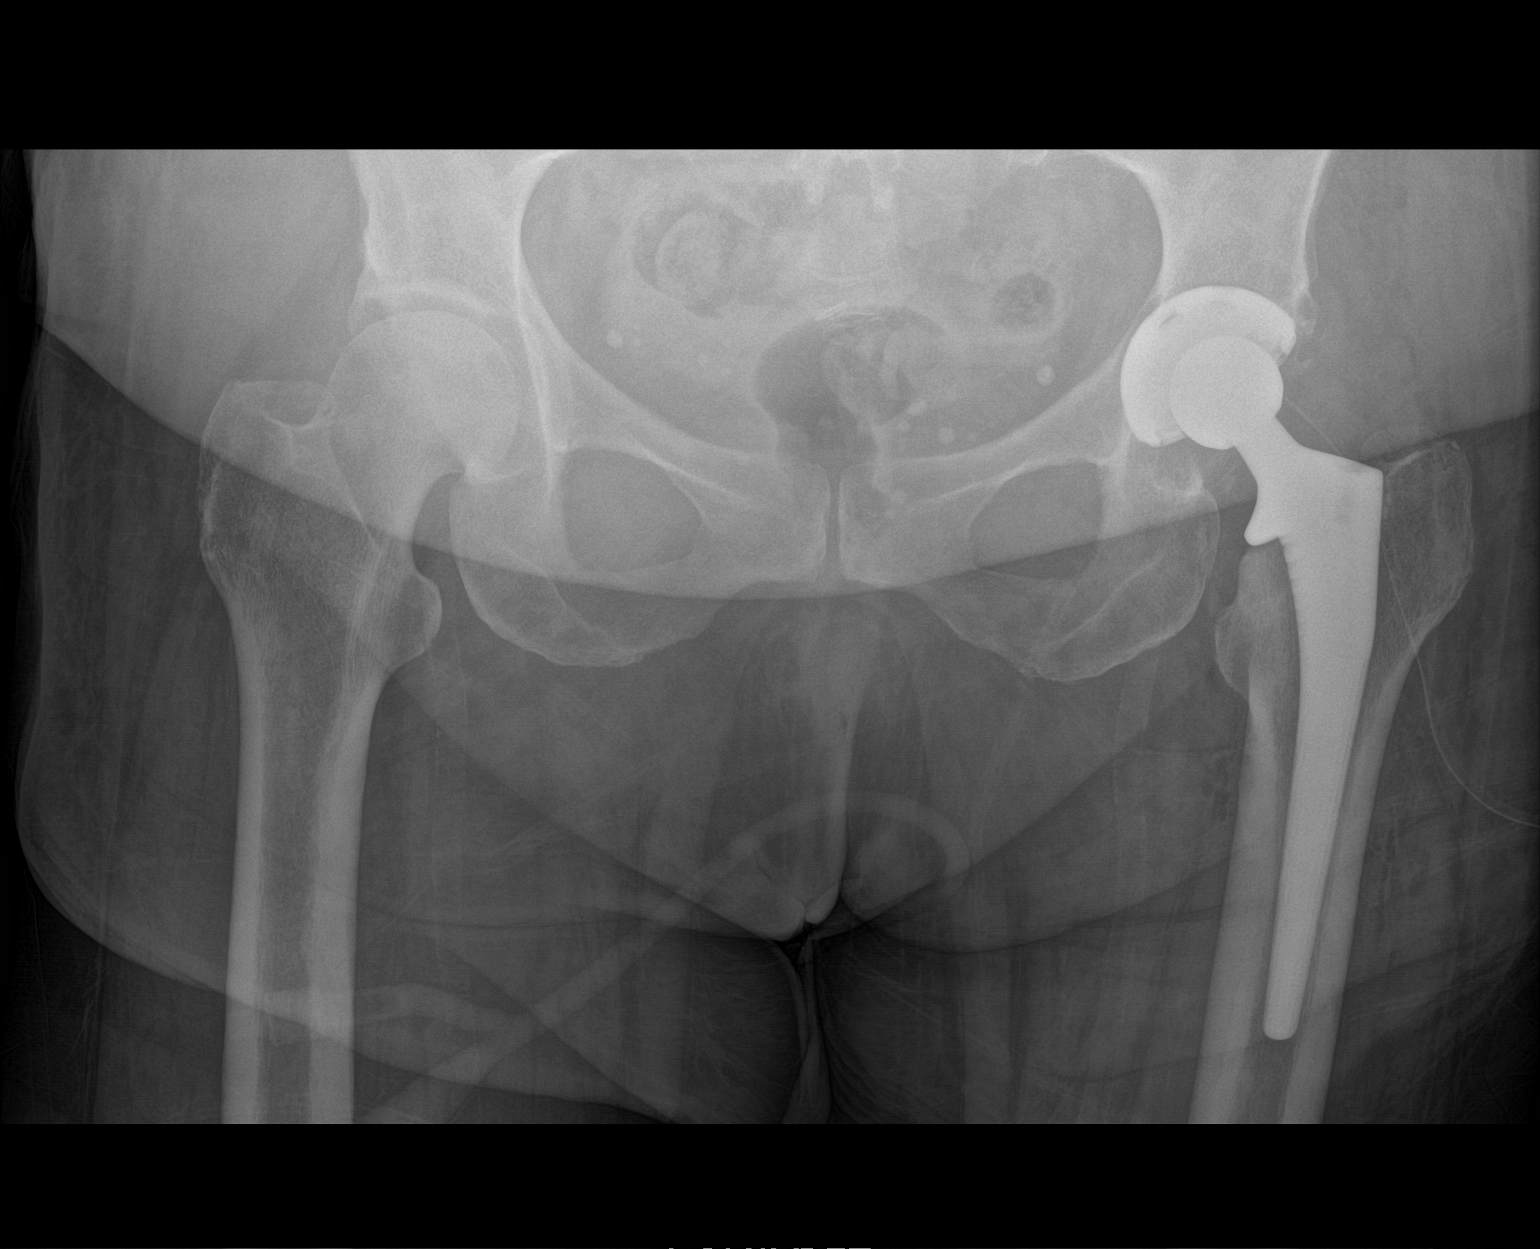

[1 of 1 positions shown; findings below may reference images not displayed]

FINDINGS: A left total hip arthroplasty is noted. A single AP view is
submitted. There is no radiographic evidence for complication. A
drain is in place.
IMPRESSION: Left total hip arthroplasty without radiographic evidence for
complication.

## 2018-10-17 MED ORDER — MECLIZINE HCL 25 MG PO TABS: 25.0000 mg | ORAL_TABLET | Freq: Three times a day (TID) | ORAL | 1 refills | Status: DC | PRN

## 2018-10-17 MED ORDER — ONDANSETRON HCL 8 MG PO TABS: 8.0000 mg | ORAL_TABLET | Freq: Three times a day (TID) | ORAL | 1 refills | Status: DC | PRN

## 2018-10-17 NOTE — Telephone Encounter (Signed)
meds sent to pharm and pt notified.

## 2018-10-17 NOTE — Telephone Encounter (Signed)
Hx of inner ear, woke up with vertigo causing nausea, room spinning. Has meclizine which has helped but would like something for nausea. Please advise. Thank you

## 2018-10-17 NOTE — Telephone Encounter (Signed)
Tried to call to get more info. No answer

## 2018-10-17 NOTE — Telephone Encounter (Signed)
Meclizine 25 mg 1 3 times daily as needed dizziness, #24, 1 refill Zofran 8 mg 1 3 times daily as needed nausea, #12, 1 refill  Patient may or may not need the meclizine prescription she may already have some Follow-up if ongoing

## 2018-10-17 NOTE — Telephone Encounter (Signed)
Having Vertigo today, already has meclizine but would like something called in for nausea.  [facility] on [address]

## 2018-10-22 DIAGNOSIS — Z1231 Encounter for screening mammogram for malignant neoplasm of breast: Secondary | ICD-10-CM | POA: Diagnosis not present

## 2018-10-30 DIAGNOSIS — R928 Other abnormal and inconclusive findings on diagnostic imaging of breast: Secondary | ICD-10-CM | POA: Diagnosis not present

## 2018-10-31 ENCOUNTER — Emergency Department (HOSPITAL_COMMUNITY): Payer: Worker's Compensation

## 2018-10-31 ENCOUNTER — Emergency Department (HOSPITAL_COMMUNITY)
Admission: EM | Admit: 2018-10-31 | Discharge: 2018-10-31 | Disposition: A | Discharge status: Home / Self Care | Payer: Worker's Compensation | Attending: Emergency Medicine | Admitting: Emergency Medicine

## 2018-10-31 ENCOUNTER — Encounter (HOSPITAL_COMMUNITY): Payer: Self-pay

## 2018-10-31 ENCOUNTER — Other Ambulatory Visit: Payer: Self-pay

## 2018-10-31 DIAGNOSIS — Z7984 Long term (current) use of oral hypoglycemic drugs: Secondary | ICD-10-CM | POA: Insufficient documentation

## 2018-10-31 DIAGNOSIS — E039 Hypothyroidism, unspecified: Secondary | ICD-10-CM | POA: Insufficient documentation

## 2018-10-31 DIAGNOSIS — W010XXA Fall on same level from slipping, tripping and stumbling without subsequent striking against object, initial encounter: Secondary | ICD-10-CM | POA: Insufficient documentation

## 2018-10-31 DIAGNOSIS — Y999 Unspecified external cause status: Secondary | ICD-10-CM | POA: Diagnosis not present

## 2018-10-31 DIAGNOSIS — Y929 Unspecified place or not applicable: Secondary | ICD-10-CM | POA: Diagnosis not present

## 2018-10-31 DIAGNOSIS — Y939 Activity, unspecified: Secondary | ICD-10-CM | POA: Diagnosis not present

## 2018-10-31 DIAGNOSIS — S0285XA Fracture of orbit, unspecified, initial encounter for closed fracture: Secondary | ICD-10-CM

## 2018-10-31 DIAGNOSIS — E1122 Type 2 diabetes mellitus with diabetic chronic kidney disease: Secondary | ICD-10-CM | POA: Diagnosis not present

## 2018-10-31 DIAGNOSIS — Z7982 Long term (current) use of aspirin: Secondary | ICD-10-CM | POA: Diagnosis not present

## 2018-10-31 DIAGNOSIS — S4991XA Unspecified injury of right shoulder and upper arm, initial encounter: Secondary | ICD-10-CM | POA: Diagnosis not present

## 2018-10-31 DIAGNOSIS — S42291A Other displaced fracture of upper end of right humerus, initial encounter for closed fracture: Secondary | ICD-10-CM | POA: Insufficient documentation

## 2018-10-31 DIAGNOSIS — W19XXXA Unspecified fall, initial encounter: Secondary | ICD-10-CM | POA: Diagnosis not present

## 2018-10-31 DIAGNOSIS — N183 Chronic kidney disease, stage 3 (moderate): Secondary | ICD-10-CM | POA: Insufficient documentation

## 2018-10-31 DIAGNOSIS — Z87891 Personal history of nicotine dependence: Secondary | ICD-10-CM | POA: Insufficient documentation

## 2018-10-31 DIAGNOSIS — Z79899 Other long term (current) drug therapy: Secondary | ICD-10-CM | POA: Insufficient documentation

## 2018-10-31 DIAGNOSIS — I129 Hypertensive chronic kidney disease with stage 1 through stage 4 chronic kidney disease, or unspecified chronic kidney disease: Secondary | ICD-10-CM | POA: Diagnosis not present

## 2018-10-31 DIAGNOSIS — S42261A Displaced fracture of lesser tuberosity of right humerus, initial encounter for closed fracture: Secondary | ICD-10-CM | POA: Diagnosis not present

## 2018-10-31 DIAGNOSIS — S0003XA Contusion of scalp, initial encounter: Secondary | ICD-10-CM | POA: Diagnosis not present

## 2018-10-31 DIAGNOSIS — M25511 Pain in right shoulder: Secondary | ICD-10-CM | POA: Diagnosis not present

## 2018-10-31 DIAGNOSIS — S0990XA Unspecified injury of head, initial encounter: Secondary | ICD-10-CM | POA: Diagnosis present

## 2018-10-31 DIAGNOSIS — R609 Edema, unspecified: Secondary | ICD-10-CM | POA: Diagnosis not present

## 2018-10-31 DIAGNOSIS — S42251A Displaced fracture of greater tuberosity of right humerus, initial encounter for closed fracture: Secondary | ICD-10-CM | POA: Diagnosis not present

## 2018-10-31 DIAGNOSIS — M79603 Pain in arm, unspecified: Secondary | ICD-10-CM | POA: Diagnosis not present

## 2018-10-31 MED ORDER — FENTANYL CITRATE (PF) 100 MCG/2ML IJ SOLN
50.0000 ug | Freq: Once | INTRAMUSCULAR | Status: AC
  Administered 2018-10-31: 50 ug via INTRAMUSCULAR
  Filled 2018-10-31: qty 2

## 2018-10-31 MED ORDER — OXYCODONE-ACETAMINOPHEN 5-325 MG PO TABS
2.0000 | ORAL_TABLET | Freq: Once | ORAL | Status: AC
  Administered 2018-10-31: 2 via ORAL
  Filled 2018-10-31: qty 2

## 2018-10-31 MED ORDER — OXYCODONE-ACETAMINOPHEN 5-325 MG PO TABS: 1.0000 | ORAL_TABLET | ORAL | 0 refills | Status: DC | PRN

## 2018-10-31 NOTE — ED Provider Notes (Signed)
[facility] EMERGENCY DEPARTMENT Provider Note   CSN: [id number] Arrival date & time: 10/31/18  1205     History   Chief Complaint Chief Complaint  Patient presents with  . Fall  . Arm Pain    HPI Sabrina Deleon is a 68 y.o. female.     The history is provided by the patient. No language interpreter was used.  Fall This is a new problem. The current episode started less than 1 hour ago. The problem occurs constantly. The problem has been gradually worsening. Nothing aggravates the symptoms. Nothing relieves the symptoms. She has tried nothing for the symptoms. The treatment provided no relief.  Arm Pain  Pt reports she tripped over a 57 week old puppy and fell.  Pt his her face and her right shoulder  Past Medical History:  Diagnosis Date  . Aortic stenosis   . Arthritis   . Chronic kidney disease    Dr. Tami Ribas- noted increase kidney function levels- took pt off Metformin and started on Onglyza for Blood sugar control-pt being followed for this.  Marland Kitchen GERD (gastroesophageal reflux disease)   . Gout   . Hypertension   . Hypothyroidism   . Morbid obesity ([place]) 03/29/2018   Patient has elevated BMI with diabetes hyperlipidemia and hypertension  . Pancreatitis   . Type 2 diabetes mellitus ([place])   . Wears glasses   . Wears partial dentures    Upper    Patient Active Problem List   Diagnosis Date Noted  . Primary osteoarthritis involving multiple joints 09/12/2018  . Morbid obesity ([place]) 03/29/2018  . Severe aortic stenosis   . Iron deficiency anemia 10/13/2017  . Anxiety and depression 04/28/2017  . Encounter for long-term opiate analgesic use 04/28/2017  . Psychophysiological insomnia 04/28/2017  . OA (osteoarthritis) of hip 03/22/2016  . Chronic right hip pain 10/29/2015  . Chronic kidney disease (CKD) stage G3b/A1, moderately decreased glomerular filtration rate (GFR) between 30-44 mL/min/1.73 square meter and albuminuria creatinine ratio less than 30 mg/g (HCC)  06/03/2015  . Type 2 diabetes mellitus ([place]) 11/09/2014  . Diastolic dysfunction, left ventricle 05/16/2013  . Calcific aortic stenosis 05/16/2013  . Gout 05/12/2013  . Diabetic retinopathy ([place]) 12/17/2012  . Hypertension 06/10/2012  . Hyperlipidemia 06/10/2012  . Hypothyroidism 06/10/2012    Past Surgical History:  Procedure Laterality Date  . BACK SURGERY     Lumbar  . CARPAL TUNNEL RELEASE  11/02/2011   Procedure: CARPAL TUNNEL RELEASE;  Surgeon: Tennis Must, MD;  Location: [place];  Service: Orthopedics;  Laterality: Right;  . CARPAL TUNNEL RELEASE Left 01/14/2015   Procedure: LEFT CARPAL TUNNEL RELEASE;  Surgeon: Leanora Cover, MD;  Location: [place];  Service: Orthopedics;  Laterality: Left;  . CHOLECYSTECTOMY     laparoscopic  . COLONOSCOPY    . COLONOSCOPY N/A 11/12/2015   Procedure: COLONOSCOPY;  Surgeon: Rogene Houston, MD;  [facility];  Service: Endoscopy;  Laterality: N/A;  9:30  . ESOPHAGOGASTRODUODENOSCOPY N/A 11/12/2015   Procedure: ESOPHAGOGASTRODUODENOSCOPY (EGD);  Surgeon: Rogene Houston, MD;  [facility];  Service: Endoscopy;  Laterality: N/A;  . RIGHT/LEFT HEART CATH AND CORONARY ANGIOGRAPHY N/A 10/18/2017   Procedure: RIGHT/LEFT HEART CATH AND CORONARY ANGIOGRAPHY;  Surgeon: Burnell Blanks, MD;  Location: [place] [facility];  Service: Cardiovascular;  Laterality: N/A;  . TOTAL HIP ARTHROPLASTY Left 03/22/2016   Procedure: LEFT TOTAL HIP ARTHROPLASTY ANTERIOR APPROACH;  Surgeon: Gaynelle Arabian, MD;  [facility];  Service: Orthopedics;  Laterality: Left;     OB History   No obstetric history on file.      Home Medications    Prior to Admission medications   Medication Sig Start Date End Date Taking? Authorizing Provider  allopurinol (ZYLOPRIM) 100 MG tablet TAKE 1 TABLET BY MOUTH TWICE A DAY 05/27/18   Kathyrn Drown, MD  aspirin EC 81 MG tablet Take 81 mg by mouth daily.    [provider]  Cholecalciferol (VITAMIN D PO) Take by mouth daily.    [provider]  citalopram (CELEXA) 20 MG tablet TAKE 1 AND 1/2 TABLETS BY MOUTH EVERY DAY 10/18/18   Kathyrn Drown, MD  colchicine 0.6 MG tablet Take 0.6 mg by mouth daily as needed (gout flares).     [provider]  ferrous sulfate 325 (65 FE) MG tablet Take 325 mg by mouth daily with breakfast.    [provider]  furosemide (LASIX) 40 MG tablet TAKE 1 TABLET(40 MG) BY MOUTH DAILY 08/26/18   Luking, Elayne Snare, MD  glipiZIDE (GLUCOTROL) 5 MG tablet TAKE 1/2 TABLET BY MOUTH TWICE DAILY 03/11/18   Sallee Lange A, MD  glucose blood test strip 1 each by Other route as needed for other. Use as instructed 10/14/18   Nilda Simmer, NP  HYDROcodone-acetaminophen (NORCO/VICODIN) 5-325 MG tablet Take 0.5 tablets by mouth every 8 (eight) hours as needed for severe pain. 10/11/18   Kathyrn Drown, MD  ketoconazole (NIZORAL) 2 % cream Apply 1 application topically as needed for irritation. 10/12/17   Nilda Simmer, NP  levothyroxine (SYNTHROID) 137 MCG tablet Take 1 tablet (137 mcg total) by mouth daily before breakfast. 09/12/18   Nilda Simmer, NP  lisinopril (ZESTRIL) 20 MG tablet TAKE 1 TABLET BY MOUTH EVERY DAY 09/02/18   Kathyrn Drown, MD  meclizine (ANTIVERT) 25 MG tablet Take 1 tablet (25 mg total) by mouth 3 (three) times daily as needed for dizziness. 10/17/18   Kathyrn Drown, MD  Multiple Vitamins-Minerals (MULTIVITAMIN ADULTS PO) Take 1 tablet by mouth daily.    [provider]  ondansetron (ZOFRAN) 8 MG tablet Take 1 tablet (8 mg total) by mouth every 8 (eight) hours as needed for nausea or vomiting. 10/17/18   Kathyrn Drown, MD  pantoprazole (PROTONIX) 40 MG tablet TAKE 1 TABLET BY MOUTH EVERY DAY 05/16/18   Kathyrn Drown, MD  pravastatin (PRAVACHOL) 40 MG tablet TAKE 1 TABLET BY MOUTH EVERY DAY 09/02/18   Kathyrn Drown, MD  zolpidem (AMBIEN) 10 MG tablet TAKE 1 TABLET(10  MG) BY MOUTH AT BEDTIME 08/22/18   Kathyrn Drown, MD  zolpidem (AMBIEN) 5 MG tablet Take one tablet by mouth each night at bedtime 10/08/18   Kathyrn Drown, MD    Family History No family history on file.  Social History Social History   Tobacco Use  . Smoking status: Former Smoker    Types: Cigarettes    Quit date: 10/29/1981    Years since quitting: 37.0  . Smokeless tobacco: Never Used  Substance Use Topics  . Alcohol use: Yes    Comment: occ  . Drug use: No     Allergies   Patient has no known allergies.   Review of Systems Review of Systems  All other systems reviewed and are negative.    Physical Exam Updated Vital Signs BP (!) 127/54 (BP Location: Left Arm)   Pulse  76   Temp 98 F (36.7 C) (Oral)   Resp 14   Ht 5\' 8"  (1.727 m)   Wt 115.2 kg   SpO2 97%   BMI 38.62 kg/m   Physical Exam Vitals signs reviewed.  HENT:     Head: Normocephalic.     Comments: Bruising face, swelling and bruising around right eye  Skin:    General: Skin is warm.  Neurological:     General: No focal deficit present.     Mental Status: She is alert.  Psychiatric:        Mood and Affect: Mood normal.      ED Treatments / Results  Labs (all labs ordered are listed, but only abnormal results are displayed) Labs Reviewed - No data to display  EKG None  Radiology Dg Shoulder Right  Result Date: 10/31/2018 CLINICAL DATA:  Recent trip and fall with shoulder pain, initial encounter EXAM: RIGHT SHOULDER - 2+ VIEW COMPARISON:  None. FINDINGS: Degenerative changes of the acromioclavicular joint are seen. Irregularity in the humeral head is noted suspicious for comminuted fracture. This is incompletely evaluated on this limited exam. No definitive dislocation is seen. IMPRESSION: Changes highly suspicious for comminuted fracture of the humeral head. Cross-sectional imaging may be helpful for further evaluation. Electronically Signed   By: Inez Catalina M.D.   On: 10/31/2018  13:30   Ct Head Wo Contrast  Result Date: 10/31/2018 CLINICAL DATA:  Head trauma post fall. EXAM: CT HEAD WITHOUT CONTRAST CT MAXILLOFACIAL WITHOUT CONTRAST TECHNIQUE: Multidetector CT imaging of the head and maxillofacial structures were performed using the standard protocol without intravenous contrast. Multiplanar CT image reconstructions of the maxillofacial structures were also generated. COMPARISON:  None. FINDINGS: CT HEAD FINDINGS Brain: No evidence of acute infarction, hemorrhage, hydrocephalus, extra-axial collection or mass lesion/mass effect. Vascular: Calcific atherosclerotic disease of the intra cavernous carotid arteries. Skull: Normal. Negative for fracture or focal lesion. Other: Large right frontal and preseptal periorbital scalp hematoma. CT MAXILLOFACIAL FINDINGS Osseous: Mildly comminuted displaced fracture of the inferior right orbital wall. Orbits: Tiny fracture fragment within the inferior right orbit with associated small hematoma and few gas bubbles. No evidence of orbital fat or extraocular muscle entrapment. The left orbit is normal. Sinuses: Hemorrhagic right maxillary sinus effusion. The remainder of the paranasal sinuses are normal. Soft tissues: Large right preseptal periorbital hematoma. IMPRESSION: 1. No acute intracranial abnormality. 2. Mildly comminuted displaced fracture of the inferior right orbital wall. 3. Tiny fracture fragment within the inferior right orbit with associated small hematoma and few gas bubbles. No evidence of orbital fat or extraocular muscle entrapment. Associated hemorrhagic right maxillary sinus effusion. 4. Large right frontal scalp and preseptal periorbital hematoma. Electronically Signed   By: Fidela Salisbury M.D.   On: 10/31/2018 13:59   Ct Maxillofacial Wo Contrast  Result Date: 10/31/2018 CLINICAL DATA:  Head trauma post fall. EXAM: CT HEAD WITHOUT CONTRAST CT MAXILLOFACIAL WITHOUT CONTRAST TECHNIQUE: Multidetector CT imaging of the head  and maxillofacial structures were performed using the standard protocol without intravenous contrast. Multiplanar CT image reconstructions of the maxillofacial structures were also generated. COMPARISON:  None. FINDINGS: CT HEAD FINDINGS Brain: No evidence of acute infarction, hemorrhage, hydrocephalus, extra-axial collection or mass lesion/mass effect. Vascular: Calcific atherosclerotic disease of the intra cavernous carotid arteries. Skull: Normal. Negative for fracture or focal lesion. Other: Large right frontal and preseptal periorbital scalp hematoma. CT MAXILLOFACIAL FINDINGS Osseous: Mildly comminuted displaced fracture of the inferior right orbital wall. Orbits: Tiny fracture fragment within  the inferior right orbit with associated small hematoma and few gas bubbles. No evidence of orbital fat or extraocular muscle entrapment. The left orbit is normal. Sinuses: Hemorrhagic right maxillary sinus effusion. The remainder of the paranasal sinuses are normal. Soft tissues: Large right preseptal periorbital hematoma. IMPRESSION: 1. No acute intracranial abnormality. 2. Mildly comminuted displaced fracture of the inferior right orbital wall. 3. Tiny fracture fragment within the inferior right orbit with associated small hematoma and few gas bubbles. No evidence of orbital fat or extraocular muscle entrapment. Associated hemorrhagic right maxillary sinus effusion. 4. Large right frontal scalp and preseptal periorbital hematoma. Electronically Signed   By: Fidela Salisbury M.D.   On: 10/31/2018 13:59    Procedures Procedures (including critical care time)  Medications Ordered in ED Medications  fentaNYL (SUBLIMAZE) injection 50 mcg (has no administration in time range)     Initial Impression / Assessment and Plan / ED Course  I have reviewed the triage vital signs and the nursing notes.  Pertinent labs & imaging results that were available during my care of the patient were reviewed by me and  considered in my medical decision making (see chart for details).        MDM  Pt advised to follow up with Ent for facial fracture and Orthopaedist for shoulder injury  I spoke with Dr. Jeannie Fend  Who will see pt in the office tomorrow  Final Clinical Impressions(s) / ED Diagnoses   Final diagnoses:  Other closed displaced fracture of proximal end of right humerus, initial encounter  Closed fracture of orbit, initial encounter    ED Discharge Orders         Ordered    oxyCODONE-acetaminophen (PERCOCET) 5-325 MG tablet  Every 4 hours PRN     10/31/18 1727        An After Visit Summary was printed and given to the patient.    Fransico Meadow, [place] 10/31/18 1730    Lucrezia Starch, MD 11/01/18 [phone]

## 2018-10-31 NOTE — ED Triage Notes (Addendum)
Pt was at work and a dog ran in front of her and she tripped over a dog . Fell on right arm. Complaining of right upper arm pain and has brusing and swelling to right eye and right forehead. Denies LOC

## 2018-11-01 ENCOUNTER — Encounter (HOSPITAL_COMMUNITY): Payer: Self-pay | Admitting: General Practice

## 2018-11-01 DIAGNOSIS — S42241A 4-part fracture of surgical neck of right humerus, initial encounter for closed fracture: Secondary | ICD-10-CM | POA: Diagnosis not present

## 2018-11-01 DIAGNOSIS — M25511 Pain in right shoulder: Secondary | ICD-10-CM | POA: Diagnosis not present

## 2018-11-01 NOTE — Progress Notes (Signed)
Patient denies shortness of breath, cough, and chest pain.  Patient will have covid test on 11/02/18.  PCP- Dr. Wolfgang [place] Cardiologist - Dr. Domenic Polite but has seen Dr. Cyndia Bent for aortic stenosis  EKG - 10/18/2017 Echo - 09/06/2017 Cath - 2019  Patient states that she does not check her blood sugars at home and does not know her fasting blood sugar.  Instructed patient to take her blood sugar the morning of surgery and if blood sugar less than 70 then to drink 1/2 cup of apple or cranberry juice and recheck in 15 minutes.  Patient provided with short stay number and instructed to call with any questions.    Myra Gianotti, PA called to review chart.

## 2018-11-01 NOTE — Progress Notes (Signed)
Anesthesia Chart Review: Sabrina Deleon    Case: [id number] Date/Time: 11/04/18 1115   Procedure: REVERSE SHOULDER ARTHROPLASTY (Right )   Anesthesia type: General   Pre-op diagnosis: right proximal humerus fracture   Location: [facility] OR ROOM 07 / [facility]   Surgeon: Verner Mould, MD      DISCUSSION: Patient is a 68 year old female scheduled for the above procedure. Fall (tripped over a dog) on 10/31/18 and sustained a right proximal humerus likely 3 part fracture and right orbital fracture. ED set up ortho follow-up on 11/01/18 and referred to ENT for orbital fracture.   History includes former smoker, severe aortic stenosis, DM2, HTN, GERD, hypothyroidism, CKD (Stage III), pancreatitis, obesity, anxiety.   She was evaluated by CT surgeon Dr. Cyndia Bent last on 02/20/18. Patient wanted to pursue TAVR, but he was concerned that she would be at significantly increased risk for perivalvular leak due to a bar of calcium extending from the noncoronary cusp down into the left ventricle. Surgical AVR was recommended for the best long-term result. She was having relatively mild symptoms at that time and wanted to wait until 2020 to decide about surgery plans. Last seen by cardiology in July 2019.  Discussed severe AS, last CT and cardiology notes with anesthesiologist Montez Hageman, MD. Patient with acute fracture. Anesthesia team to evaluate on the day of surgery. Definitive plan at that time. Presurgical COVID test scheduled for 11/02/18.   PROVIDERS: Kathyrn Drown, MD is PCP  - Rozann Lesches, MD is cardiologist. Last visit 10/12/17 when cardiac cath arranged for work-up for severe AS. NYHA class II dyspnea with most activities at that time, she would get fatigued at that end of the day and would have to pace herself, but no angina, syncope, or palpitations at that time.   Gilford Raid, MD is CT surgeon. Last visit 02/20/18.    LABS: Needs updated labs. As of 09/06/18 lab results included: Lab  Results  Component Value Date   WBC 6.7 09/06/2018   HGB 11.1 09/06/2018   HCT 32.2 (L) 09/06/2018   PLT 201 09/06/2018   GLUCOSE 96 09/06/2018   ALT 13 09/06/2018   AST 36 09/06/2018   NA 135 09/06/2018   K 4.7 09/06/2018   CL 99 09/06/2018   CREATININE 1.52 (H) 09/06/2018   BUN 34 (H) 09/06/2018   CO2 23 09/06/2018   TSH 4.410 09/06/2018   HGBA1C 6.5 (H) 09/06/2018    PFTs 02/01/18: FVC 3.48 (92%), post 3.54 (94%). FEV1 2.85 (98%), post 2.79 (96%). DLCO unc 26.03 (83%).   IMAGES: CT right shoulder 10/31/18: IMPRESSION: 1. Unusual fracture dislocation of the right glenohumeral joint. Based on displacement of fragments this is likely a 3-part fracture of the humerus, with unusual features such as rotation and impaction of the dominant articular component of the humeral head. There is also inferior dislocation of the humerus with a bony Bankart fracture of the inferior glenoid. Comminution of smaller fragments posteriorly. 2. Complex fluid compatible with hematoma in the subacromial subdeltoid bursa.  CT head/maxillofacial 10/31/18: IMPRESSION: 1. No acute intracranial abnormality. 2. Mildly comminuted displaced fracture of the inferior right orbital wall. 3. Tiny fracture fragment within the inferior right orbit with associated small hematoma and few gas bubbles. No evidence of orbital fat or extraocular muscle entrapment. Associated hemorrhagic right maxillary sinus effusion. 4. Large right frontal scalp and preseptal periorbital hematoma.  CTA chest/abd/pelvis 02/01/18: IMPRESSION: 1. Vascular findings and measurements pertinent to potential TAVR procedure, as  detailed above. 2. Severe thickening calcification of the aortic valve, compatible with the reported clinical history of severe aortic stenosis. 3. Dilatation of the pulmonic trunk (3.7 cm in diameter), concerning for pulmonary arterial hypertension. 4. Mild cardiomegaly. 5. There are calcifications of  the mitral annulus. Echocardiographic correlation for evaluation of potential valvular dysfunction may be warranted if clinically indicated. 6. Additional incidental findings, as above.   EKG: Needs updated EKG.  - On 81/19: Normal sinus rhythm leftward axis No significant change since last tracing 06/07/17 Confirmed by Minus Breeding [phone]) on 10/18/2017 11:34:39 AM   CV: Carotid US 02/01/18: Summary: Right Carotid: Velocities in the right ICA are consistent with a 40-59%                stenosis. A narrowing segment at proximal to mid, velocity                elevated. Left Carotid: Velocities in the left ICA are consistent with a 1-39% stenosis. Vertebrals: Bilateral vertebral arteries demonstrate antegrade flow.   CT coronary 02/01/18: IMPRESSION: 1. Aortic valve is trileaflet with severely calcified leaflets and severely restricted leaflet opening. There is an asymmetric calcification extending 16 mm into the LVOT under the non-coronary cusp. Annular measurements are suitable for delivery of a 26 mm Edwards-SAPIEN 3 valve. 2. Sufficient coronary to annulus distance. 3. Optimum Fluoroscopic Angle for Delivery:  LAO 4 CAU 4. 4. No thrombus in the left atrial appendage. 5. Mildly dilated pulmonary artery measuring 33 mm suggestive of pulmonary hypertension.   Cardiac cath 10/18/17:  Prox LAD lesion is 20% stenosed.  Prox RCA to Mid RCA lesion is 20% stenosed.  Hemodynamic findings consistent with mild pulmonary hypertension. 1. Mild non-obstructive CAD 2. Severe aortic stenosis with mean gradient of 26 mmHg, peak gradient 34 mmHg. (By echo the mean gradient is 67mmHg and the leaflets are thickened with poor mobility).  Recommendations: She will need AVR. Her risk for surgical AVR seems to be low. I will make a referral to see Dr. Cyndia Bent or Dr. Roxy Manns in the [facility] [employer] surgery office to evaluate for surgical AVR. We will discuss her case at our valve team meeting as there is a  possibility of TAVR. Findings relayed to Dr. Domenic Polite.    Echo 09/06/17: Study Conclusions - Left ventricle: The cavity size was normal. Wall thickness was   increased in a pattern of moderate LVH. Systolic function was   normal. The estimated ejection fraction was in the range of 60%   to 65%. Wall motion was normal; there were no regional wall   motion abnormalities. Doppler parameters are consistent with   abnormal left ventricular relaxation (grade 1 diastolic   dysfunction). Doppler parameters are consistent with high   ventricular filling pressure. - Aortic valve: Severely calcified annulus. Trileaflet; severely   thickened leaflets. There was severe stenosis. There was mild   regurgitation. Mean gradient (S): 44 mm Hg. Valve area (VTI):   0.89 cm^2. Valve area (Vmax): 0.87 cm^2. Valve area (Vmean): 0.83   cm^2. - Mitral valve: Severely calcified annulus. Moderately thickened   leaflets . The findings are consistent with mild stenosis. Valve   area by continuity equation (using LVOT flow): 1.71 cm^2. - Left atrium: The atrium was mildly dilated. - Technically difficult study.    Past Medical History:  Diagnosis Date  . Anxiety   . Aortic stenosis   . Arthritis   . Chronic kidney disease    Dr. Tami Ribas- noted increase kidney function levels-  took pt off Metformin and started on Onglyza for Blood sugar control-pt being followed for this.  Marland Kitchen GERD (gastroesophageal reflux disease)   . Gout   . Heart murmur   . Hypertension   . Hypothyroidism   . Morbid obesity ([place]) 03/29/2018   Patient has elevated BMI with diabetes hyperlipidemia and hypertension  . Pancreatitis   . Type 2 diabetes mellitus ([place])   . Wears glasses   . Wears partial dentures    Upper    Past Surgical History:  Procedure Laterality Date  . BACK SURGERY     Lumbar  . CARPAL TUNNEL RELEASE  11/02/2011   Procedure: CARPAL TUNNEL RELEASE;  Surgeon: Tennis Must, MD;  Location: [place];  Service: Orthopedics;  Laterality: Right;  . CARPAL TUNNEL RELEASE Left 01/14/2015   Procedure: LEFT CARPAL TUNNEL RELEASE;  Surgeon: Leanora Cover, MD;  Location: [place];  Service: Orthopedics;  Laterality: Left;  . CHOLECYSTECTOMY     laparoscopic  . COLONOSCOPY    . COLONOSCOPY N/A 11/12/2015   Procedure: COLONOSCOPY;  Surgeon: Rogene Houston, MD;  [facility];  Service: Endoscopy;  Laterality: N/A;  9:30  . ESOPHAGOGASTRODUODENOSCOPY N/A 11/12/2015   Procedure: ESOPHAGOGASTRODUODENOSCOPY (EGD);  Surgeon: Rogene Houston, MD;  [facility];  Service: Endoscopy;  Laterality: N/A;  . RIGHT/LEFT HEART CATH AND CORONARY ANGIOGRAPHY N/A 10/18/2017   Procedure: RIGHT/LEFT HEART CATH AND CORONARY ANGIOGRAPHY;  Surgeon: Burnell Blanks, MD;  Location: [place] [facility];  Service: Cardiovascular;  Laterality: N/A;  . TOTAL HIP ARTHROPLASTY Left 03/22/2016   Procedure: LEFT TOTAL HIP ARTHROPLASTY ANTERIOR APPROACH;  Surgeon: Gaynelle Arabian, MD;  Location[facility];  Service: Orthopedics;  Laterality: Left;    MEDICATIONS: No current facility-administered medications for this encounter.    Marland Kitchen allopurinol (ZYLOPRIM) 100 MG tablet  . aspirin EC 81 MG tablet  . Cholecalciferol (VITAMIN D PO)  . citalopram (CELEXA) 20 MG tablet  . ferrous sulfate 325 (65 FE) MG tablet  . furosemide (LASIX) 40 MG tablet  . glipiZIDE (GLUCOTROL) 5 MG tablet  . glucose blood test strip  . HYDROcodone-acetaminophen (NORCO/VICODIN) 5-325 MG tablet  . ketoconazole (NIZORAL) 2 % cream  . levothyroxine (SYNTHROID) 137 MCG tablet  . lisinopril (ZESTRIL) 20 MG tablet  . meclizine (ANTIVERT) 25 MG tablet  . ondansetron (ZOFRAN) 8 MG tablet  . oxyCODONE-acetaminophen (PERCOCET) 5-325 MG tablet  . pantoprazole (PROTONIX) 40 MG tablet  . pravastatin (PRAVACHOL) 40 MG tablet  . zolpidem (AMBIEN) 10 MG tablet  . zolpidem (AMBIEN) 5 MG tablet    Myra Gianotti,  PA-C Surgical Short Stay/Anesthesiology [facility] Phone [phone] [facility] Phone [phone] 11/01/2018 4:28 PM

## 2018-11-01 NOTE — Anesthesia Preprocedure Evaluation (Addendum)
Anesthesia Evaluation  Patient identified by MRN, date of birth, ID band Patient awake    Reviewed: Allergy & Precautions, NPO status , Patient's Chart, lab work & pertinent test results  History of Anesthesia Complications Negative for: history of anesthetic complications  Airway Mallampati: I  TM Distance: >3 FB Neck ROM: Full    Dental  (+) Teeth Intact   Pulmonary neg pulmonary ROS, former smoker,    Pulmonary exam normal        Cardiovascular hypertension, + CAD (nonobstructive by cath 10/18/17)  Normal cardiovascular exam+ Valvular Problems/Murmurs AS    Cardiac cath 10/18/17: Prox LAD lesion is 20% stenosed. Prox RCA to Mid RCA lesion is 20% stenosed. Hemodynamic findings consistent with mild pulmonary hypertension. 1. Mild non-obstructive CAD 2. Severe aortic stenosis with mean gradient of 26 mmHg, peak gradient 34 mmHg. (By echo the mean gradient is 42mmHg and the leaflets are thickened with poor mobility).  Recommendations: She will need AVR. Her risk for surgical AVR seems to be low. I will make a referral to see Dr. Cyndia Bent or Dr. Roxy Manns in the [place] surgery office to evaluate for surgical AVR. We will discuss her case at our valve team meeting as there is a possibility of TAVR. Findings relayed to Dr. Domenic Polite.    Echo 09/06/17: Study Conclusions - Left ventricle: The cavity size was normal. Wall thickness was   increased in a pattern of moderate LVH. Systolic function was   normal. The estimated ejection fraction was in the range of 60%   to 65%. Wall motion was normal; there were no regional wall   motion abnormalities. Doppler parameters are consistent with   abnormal left ventricular relaxation (grade 1 diastolic   dysfunction). Doppler parameters are consistent with high   ventricular filling pressure. - Aortic valve: Severely calcified annulus. Trileaflet; severely   thickened leaflets. There was severe stenosis.  There was mild   regurgitation. Mean gradient (S): 44 mm Hg. Valve area (VTI):   0.89 cm^2. Valve area (Vmax): 0.87 cm^2. Valve area (Vmean): 0.83   cm^2. - Mitral valve: Severely calcified annulus. Moderately thickened   leaflets . The findings are consistent with mild stenosis. Valve   area by continuity equation (using LVOT flow): 1.71 cm^2. - Left atrium: The atrium was mildly dilated. - Technically difficult study.    Neuro/Psych PSYCHIATRIC DISORDERS Anxiety Depression Carotid stenosis (R 40-59%, L 1-39%)    GI/Hepatic Neg liver ROS, GERD  ,  Endo/Other  diabetes, Type 2Hypothyroidism   Renal/GU Renal InsufficiencyRenal disease  negative genitourinary   Musculoskeletal  (+) Arthritis ,   Abdominal   Peds  Hematology negative hematology ROS (+)   Anesthesia Other Findings Severe AS with recommendation for surgical AVR, however pt delayed plans for surgery. Now with proximal humerus fracture.  Echo 09/06/17: EF 60-65%, grade 1 dd, severe AS (mean gradient 44, area 0.89 cm2), mild AI, mild MS  Reproductive/Obstetrics                           Anesthesia Physical Anesthesia Plan  ASA: IV  Anesthesia Plan: General   Post-op Pain Management: GA combined w/ Regional for post-op pain   Induction: Intravenous  PONV Risk Score and Plan: 3 and Ondansetron, Dexamethasone, Midazolam and Treatment may vary due to age or medical condition  Airway Management Planned: Oral ETT  Additional Equipment: Arterial line  Intra-op Plan:   Post-operative Plan: Extubation in OR  Informed Consent: I  have reviewed the patients History and Physical, chart, labs and discussed the procedure including the risks, benefits and alternatives for the proposed anesthesia with the patient or authorized representative who has indicated his/her understanding and acceptance.     Dental advisory given  Plan Discussed with: CRNA, Anesthesiologist and  Surgeon  Anesthesia Plan Comments: (See PAT note written 11/01/2018 by Myra Gianotti, PA-C. Patient has severe AS. Last seen by Dr. Cyndia Bent 02/2018, and patient to contact him when she is ready to proceed with AVR. Cardiologist is Dr. Domenic Polite, last visit 09/2017.  Discussed in detail with patient the increased risk of general anesthesia with severe unrepaired aortic stenosis and she understands and agrees to proceed. Fortunately she did have hip surgery 2 years ago without incident. Will plan for pre-induction arterial line and block. Discussed with surgeon and will minimize head up position as much as possible intraoperatively. )       Anesthesia Quick Evaluation

## 2018-11-02 ENCOUNTER — Other Ambulatory Visit (HOSPITAL_COMMUNITY)
Admission: RE | Admit: 2018-11-02 | Discharge: 2018-11-02 | Disposition: A | Discharge status: Home / Self Care | Payer: Medicare Other | Source: Ambulatory Visit | Attending: Orthopaedic Surgery | Admitting: Orthopaedic Surgery

## 2018-11-02 DIAGNOSIS — Z01812 Encounter for preprocedural laboratory examination: Secondary | ICD-10-CM | POA: Diagnosis not present

## 2018-11-02 DIAGNOSIS — Z20828 Contact with and (suspected) exposure to other viral communicable diseases: Secondary | ICD-10-CM | POA: Insufficient documentation

## 2018-11-02 DIAGNOSIS — S42301A Unspecified fracture of shaft of humerus, right arm, initial encounter for closed fracture: Secondary | ICD-10-CM | POA: Insufficient documentation

## 2018-11-02 LAB — SARS CORONAVIRUS 2 (TAT 6-24 HRS): SARS Coronavirus 2: NEGATIVE

## 2018-11-03 ENCOUNTER — Other Ambulatory Visit: Payer: Self-pay | Admitting: Family Medicine

## 2018-11-04 ENCOUNTER — Encounter (HOSPITAL_COMMUNITY): Payer: Self-pay | Admitting: *Deleted

## 2018-11-04 ENCOUNTER — Inpatient Hospital Stay (HOSPITAL_COMMUNITY)
Admission: RE | Admit: 2018-11-04 | Discharge: 2018-11-07 | DRG: 483 | Disposition: A | Discharge status: Home / Self Care | Payer: Worker's Compensation | Attending: Orthopaedic Surgery | Admitting: Orthopaedic Surgery

## 2018-11-04 ENCOUNTER — Inpatient Hospital Stay (HOSPITAL_COMMUNITY): Payer: Worker's Compensation | Admitting: Vascular Surgery

## 2018-11-04 ENCOUNTER — Encounter (HOSPITAL_COMMUNITY)
Admission: RE | Disposition: A | Discharge status: Home / Self Care | Payer: Self-pay | Source: Home / Self Care | Attending: Orthopaedic Surgery

## 2018-11-04 ENCOUNTER — Inpatient Hospital Stay (HOSPITAL_COMMUNITY): Payer: Worker's Compensation

## 2018-11-04 ENCOUNTER — Other Ambulatory Visit: Payer: Self-pay

## 2018-11-04 DIAGNOSIS — Z79899 Other long term (current) drug therapy: Secondary | ICD-10-CM | POA: Diagnosis not present

## 2018-11-04 DIAGNOSIS — I2510 Atherosclerotic heart disease of native coronary artery without angina pectoris: Secondary | ICD-10-CM | POA: Diagnosis present

## 2018-11-04 DIAGNOSIS — Z419 Encounter for procedure for purposes other than remedying health state, unspecified: Secondary | ICD-10-CM

## 2018-11-04 DIAGNOSIS — K219 Gastro-esophageal reflux disease without esophagitis: Secondary | ICD-10-CM | POA: Diagnosis present

## 2018-11-04 DIAGNOSIS — Z87891 Personal history of nicotine dependence: Secondary | ICD-10-CM | POA: Diagnosis not present

## 2018-11-04 DIAGNOSIS — Z9049 Acquired absence of other specified parts of digestive tract: Secondary | ICD-10-CM

## 2018-11-04 DIAGNOSIS — Y92239 Unspecified place in hospital as the place of occurrence of the external cause: Secondary | ICD-10-CM | POA: Diagnosis not present

## 2018-11-04 DIAGNOSIS — Z972 Presence of dental prosthetic device (complete) (partial): Secondary | ICD-10-CM

## 2018-11-04 DIAGNOSIS — F329 Major depressive disorder, single episode, unspecified: Secondary | ICD-10-CM

## 2018-11-04 DIAGNOSIS — S42241A 4-part fracture of surgical neck of right humerus, initial encounter for closed fracture: Secondary | ICD-10-CM | POA: Diagnosis not present

## 2018-11-04 DIAGNOSIS — Z96611 Presence of right artificial shoulder joint: Secondary | ICD-10-CM | POA: Diagnosis not present

## 2018-11-04 DIAGNOSIS — G8918 Other acute postprocedural pain: Secondary | ICD-10-CM | POA: Diagnosis not present

## 2018-11-04 DIAGNOSIS — Z6838 Body mass index (BMI) 38.0-38.9, adult: Secondary | ICD-10-CM | POA: Diagnosis not present

## 2018-11-04 DIAGNOSIS — E785 Hyperlipidemia, unspecified: Secondary | ICD-10-CM | POA: Diagnosis not present

## 2018-11-04 DIAGNOSIS — E559 Vitamin D deficiency, unspecified: Secondary | ICD-10-CM | POA: Diagnosis present

## 2018-11-04 DIAGNOSIS — Z96642 Presence of left artificial hip joint: Secondary | ICD-10-CM | POA: Diagnosis present

## 2018-11-04 DIAGNOSIS — Z471 Aftercare following joint replacement surgery: Secondary | ICD-10-CM | POA: Diagnosis not present

## 2018-11-04 DIAGNOSIS — E1122 Type 2 diabetes mellitus with diabetic chronic kidney disease: Secondary | ICD-10-CM

## 2018-11-04 DIAGNOSIS — S42291D Other displaced fracture of upper end of right humerus, subsequent encounter for fracture with routine healing: Secondary | ICD-10-CM | POA: Diagnosis not present

## 2018-11-04 DIAGNOSIS — E875 Hyperkalemia: Secondary | ICD-10-CM | POA: Diagnosis not present

## 2018-11-04 DIAGNOSIS — M1990 Unspecified osteoarthritis, unspecified site: Secondary | ICD-10-CM | POA: Diagnosis not present

## 2018-11-04 DIAGNOSIS — Z96619 Presence of unspecified artificial shoulder joint: Secondary | ICD-10-CM

## 2018-11-04 DIAGNOSIS — Z79891 Long term (current) use of opiate analgesic: Secondary | ICD-10-CM

## 2018-11-04 DIAGNOSIS — Z7982 Long term (current) use of aspirin: Secondary | ICD-10-CM | POA: Diagnosis not present

## 2018-11-04 DIAGNOSIS — F319 Bipolar disorder, unspecified: Secondary | ICD-10-CM | POA: Diagnosis present

## 2018-11-04 DIAGNOSIS — Z7984 Long term (current) use of oral hypoglycemic drugs: Secondary | ICD-10-CM | POA: Diagnosis not present

## 2018-11-04 DIAGNOSIS — I272 Pulmonary hypertension, unspecified: Secondary | ICD-10-CM | POA: Diagnosis not present

## 2018-11-04 DIAGNOSIS — S0231XA Fracture of orbital floor, right side, initial encounter for closed fracture: Secondary | ICD-10-CM | POA: Diagnosis present

## 2018-11-04 DIAGNOSIS — S42201A Unspecified fracture of upper end of right humerus, initial encounter for closed fracture: Secondary | ICD-10-CM | POA: Diagnosis present

## 2018-11-04 DIAGNOSIS — I10 Essential (primary) hypertension: Secondary | ICD-10-CM | POA: Diagnosis present

## 2018-11-04 DIAGNOSIS — T464X5A Adverse effect of angiotensin-converting-enzyme inhibitors, initial encounter: Secondary | ICD-10-CM | POA: Diagnosis not present

## 2018-11-04 DIAGNOSIS — E039 Hypothyroidism, unspecified: Secondary | ICD-10-CM | POA: Diagnosis not present

## 2018-11-04 DIAGNOSIS — I129 Hypertensive chronic kidney disease with stage 1 through stage 4 chronic kidney disease, or unspecified chronic kidney disease: Secondary | ICD-10-CM | POA: Diagnosis present

## 2018-11-04 DIAGNOSIS — D631 Anemia in chronic kidney disease: Secondary | ICD-10-CM | POA: Diagnosis present

## 2018-11-04 DIAGNOSIS — M109 Gout, unspecified: Secondary | ICD-10-CM | POA: Diagnosis present

## 2018-11-04 DIAGNOSIS — T502X5A Adverse effect of carbonic-anhydrase inhibitors, benzothiadiazides and other diuretics, initial encounter: Secondary | ICD-10-CM | POA: Diagnosis not present

## 2018-11-04 DIAGNOSIS — N179 Acute kidney failure, unspecified: Secondary | ICD-10-CM | POA: Diagnosis not present

## 2018-11-04 DIAGNOSIS — I350 Nonrheumatic aortic (valve) stenosis: Secondary | ICD-10-CM | POA: Diagnosis present

## 2018-11-04 DIAGNOSIS — W010XXA Fall on same level from slipping, tripping and stumbling without subsequent striking against object, initial encounter: Secondary | ICD-10-CM | POA: Diagnosis present

## 2018-11-04 DIAGNOSIS — F419 Anxiety disorder, unspecified: Secondary | ICD-10-CM | POA: Diagnosis present

## 2018-11-04 DIAGNOSIS — D62 Acute posthemorrhagic anemia: Secondary | ICD-10-CM | POA: Diagnosis not present

## 2018-11-04 DIAGNOSIS — E119 Type 2 diabetes mellitus without complications: Secondary | ICD-10-CM

## 2018-11-04 DIAGNOSIS — Z973 Presence of spectacles and contact lenses: Secondary | ICD-10-CM | POA: Diagnosis not present

## 2018-11-04 DIAGNOSIS — N183 Chronic kidney disease, stage 3 (moderate): Secondary | ICD-10-CM

## 2018-11-04 HISTORY — DX: Cardiac murmur, unspecified: R01.1

## 2018-11-04 HISTORY — PX: REVERSE SHOULDER ARTHROPLASTY: SHX5054

## 2018-11-04 HISTORY — DX: Anxiety disorder, unspecified: F41.9

## 2018-11-04 LAB — BASIC METABOLIC PANEL
Anion gap: 10 (ref 5–15)
BUN: 38 mg/dL — ABNORMAL HIGH (ref 8–23)
CO2: 24 mmol/L (ref 22–32)
Calcium: 9.6 mg/dL (ref 8.9–10.3)
Chloride: 100 mmol/L (ref 98–111)
Creatinine, Ser: 1.69 mg/dL — ABNORMAL HIGH (ref 0.44–1.00)
GFR calc Af Amer: 36 mL/min — ABNORMAL LOW (ref >60)
GFR calc non Af Amer: 31 mL/min — ABNORMAL LOW (ref >60)
Glucose, Bld: 223 mg/dL — ABNORMAL HIGH (ref 70–99)
Potassium: 4.2 mmol/L (ref 3.5–5.1)
Sodium: 134 mmol/L — ABNORMAL LOW (ref 135–145)

## 2018-11-04 LAB — HEMOGLOBIN A1C
Hgb A1c MFr Bld: 6.5 % — ABNORMAL HIGH (ref 4.8–5.6)
Mean Plasma Glucose: 139.85 mg/dL

## 2018-11-04 LAB — GLUCOSE, CAPILLARY
Glucose-Capillary: 229 mg/dL — ABNORMAL HIGH (ref 70–99)
Glucose-Capillary: 271 mg/dL — ABNORMAL HIGH (ref 70–99)
Glucose-Capillary: 276 mg/dL — ABNORMAL HIGH (ref 70–99)
Glucose-Capillary: 337 mg/dL — ABNORMAL HIGH (ref 70–99)

## 2018-11-04 LAB — CBC
HCT: 32.3 % — ABNORMAL LOW (ref 36.0–46.0)
Hemoglobin: 10.5 g/dL — ABNORMAL LOW (ref 12.0–15.0)
MCH: 30.3 pg (ref 26.0–34.0)
MCHC: 32.5 g/dL (ref 30.0–36.0)
MCV: 93.4 fL (ref 80.0–100.0)
Platelets: 198 10*3/uL (ref 150–400)
RBC: 3.46 MIL/uL — ABNORMAL LOW (ref 3.87–5.11)
RDW: 13.7 % (ref 11.5–15.5)
WBC: 8.8 10*3/uL (ref 4.0–10.5)
nRBC: 0 % (ref 0.0–0.2)

## 2018-11-04 SURGERY — ARTHROPLASTY, SHOULDER, TOTAL, REVERSE
Anesthesia: General | Site: Shoulder | Laterality: Right

## 2018-11-04 MED ORDER — TRANEXAMIC ACID-NACL 1000-0.7 MG/100ML-% IV SOLN
1000.0000 mg | INTRAVENOUS | Status: AC
  Administered 2018-11-04: 1000 mg via INTRAVENOUS
  Filled 2018-11-04: qty 100

## 2018-11-04 MED ORDER — ENSURE PRE-SURGERY PO LIQD: 296.0000 mL | Freq: Once | ORAL | Status: DC

## 2018-11-04 MED ORDER — PRAVASTATIN SODIUM 40 MG PO TABS
40.0000 mg | ORAL_TABLET | Freq: Every day | ORAL | Status: DC
  Administered 2018-11-05 – 2018-11-07 (×3): 40 mg via ORAL
  Filled 2018-11-04 (×3): qty 1

## 2018-11-04 MED ORDER — ROCURONIUM BROMIDE 10 MG/ML (PF) SYRINGE
PREFILLED_SYRINGE | INTRAVENOUS | Status: AC
  Filled 2018-11-04: qty 10

## 2018-11-04 MED ORDER — POLYETHYLENE GLYCOL 3350 17 G PO PACK: 17.0000 g | PACK | Freq: Every day | ORAL | Status: DC | PRN

## 2018-11-04 MED ORDER — PANTOPRAZOLE SODIUM 40 MG PO TBEC
40.0000 mg | DELAYED_RELEASE_TABLET | Freq: Every day | ORAL | Status: DC
  Administered 2018-11-05 – 2018-11-07 (×3): 40 mg via ORAL
  Filled 2018-11-04 (×3): qty 1

## 2018-11-04 MED ORDER — FENTANYL CITRATE (PF) 250 MCG/5ML IJ SOLN
INTRAMUSCULAR | Status: AC
  Filled 2018-11-04: qty 5

## 2018-11-04 MED ORDER — ONDANSETRON HCL 4 MG/2ML IJ SOLN
INTRAMUSCULAR | Status: AC
  Filled 2018-11-04: qty 2

## 2018-11-04 MED ORDER — PROPOFOL 10 MG/ML IV BOLUS
INTRAVENOUS | Status: DC | PRN
  Administered 2018-11-04: 70 mg via INTRAVENOUS

## 2018-11-04 MED ORDER — BUPIVACAINE LIPOSOME 1.3 % IJ SUSP
INTRAMUSCULAR | Status: DC | PRN
  Administered 2018-11-04: 10 mL

## 2018-11-04 MED ORDER — ONDANSETRON HCL 4 MG/2ML IJ SOLN: 4.0000 mg | Freq: Four times a day (QID) | INTRAMUSCULAR | Status: DC | PRN

## 2018-11-04 MED ORDER — CITALOPRAM HYDROBROMIDE 20 MG PO TABS
30.0000 mg | ORAL_TABLET | Freq: Every day | ORAL | Status: DC
  Administered 2018-11-05 – 2018-11-07 (×3): 30 mg via ORAL
  Filled 2018-11-04 (×4): qty 1

## 2018-11-04 MED ORDER — METHOCARBAMOL 500 MG PO TABS
500.0000 mg | ORAL_TABLET | Freq: Four times a day (QID) | ORAL | Status: DC | PRN
  Administered 2018-11-05 (×3): 500 mg via ORAL
  Filled 2018-11-04 (×3): qty 1

## 2018-11-04 MED ORDER — LIDOCAINE 2% (20 MG/ML) 5 ML SYRINGE
INTRAMUSCULAR | Status: AC
  Filled 2018-11-04: qty 5

## 2018-11-04 MED ORDER — POVIDONE-IODINE 10 % EX SWAB
2.0000 "application " | Freq: Once | CUTANEOUS | Status: AC
  Administered 2018-11-04: 2 via TOPICAL

## 2018-11-04 MED ORDER — 0.9 % SODIUM CHLORIDE (POUR BTL) OPTIME
TOPICAL | Status: DC | PRN
  Administered 2018-11-04: 1000 mL

## 2018-11-04 MED ORDER — STERILE WATER FOR IRRIGATION IR SOLN
Status: DC | PRN
  Administered 2018-11-04: 1000 mL

## 2018-11-04 MED ORDER — SODIUM CHLORIDE 0.9 % IV SOLN
INTRAVENOUS | Status: DC | PRN
  Administered 2018-11-04: 12:00:00 10 ug/min via INTRAVENOUS

## 2018-11-04 MED ORDER — METHYLENE BLUE 0.5 % INJ SOLN
INTRAVENOUS | Status: AC
  Filled 2018-11-04: qty 10

## 2018-11-04 MED ORDER — SODIUM CHLORIDE 0.9 % IR SOLN
Status: DC | PRN
  Administered 2018-11-04: 3000 mL

## 2018-11-04 MED ORDER — OXYCODONE HCL 5 MG/5ML PO SOLN: 5.0000 mg | Freq: Once | ORAL | Status: DC | PRN

## 2018-11-04 MED ORDER — MORPHINE SULFATE (PF) 2 MG/ML IV SOLN: 0.5000 mg | INTRAVENOUS | Status: DC | PRN

## 2018-11-04 MED ORDER — FUROSEMIDE 40 MG PO TABS: 40.0000 mg | ORAL_TABLET | Freq: Every day | ORAL | Status: DC

## 2018-11-04 MED ORDER — ACETAMINOPHEN 325 MG PO TABS: 325.0000 mg | ORAL_TABLET | Freq: Four times a day (QID) | ORAL | Status: DC | PRN

## 2018-11-04 MED ORDER — ASPIRIN EC 81 MG PO TBEC: 81.0000 mg | DELAYED_RELEASE_TABLET | Freq: Every day | ORAL | Status: DC

## 2018-11-04 MED ORDER — DIPHENHYDRAMINE HCL 12.5 MG/5ML PO ELIX: 12.5000 mg | ORAL_SOLUTION | ORAL | Status: DC | PRN

## 2018-11-04 MED ORDER — LACTATED RINGERS IV SOLN
INTRAVENOUS | Status: DC
  Administered 2018-11-04 (×2): via INTRAVENOUS

## 2018-11-04 MED ORDER — FENTANYL CITRATE (PF) 100 MCG/2ML IJ SOLN: 25.0000 ug | INTRAMUSCULAR | Status: DC | PRN

## 2018-11-04 MED ORDER — FERROUS SULFATE 325 (65 FE) MG PO TABS
325.0000 mg | ORAL_TABLET | Freq: Every day | ORAL | Status: DC
  Administered 2018-11-05 – 2018-11-07 (×3): 325 mg via ORAL
  Filled 2018-11-04 (×3): qty 1

## 2018-11-04 MED ORDER — INSULIN ASPART 100 UNIT/ML ~~LOC~~ SOLN
SUBCUTANEOUS | Status: AC
  Administered 2018-11-04: 5 [IU] via SUBCUTANEOUS
  Filled 2018-11-04: qty 1

## 2018-11-04 MED ORDER — EPHEDRINE SULFATE-NACL 50-0.9 MG/10ML-% IV SOSY
PREFILLED_SYRINGE | INTRAVENOUS | Status: DC | PRN
  Administered 2018-11-04 (×5): 5 mg via INTRAVENOUS

## 2018-11-04 MED ORDER — ZOLPIDEM TARTRATE 5 MG PO TABS
5.0000 mg | ORAL_TABLET | Freq: Every day | ORAL | Status: DC
  Administered 2018-11-04 – 2018-11-06 (×3): 5 mg via ORAL
  Filled 2018-11-04 (×3): qty 1

## 2018-11-04 MED ORDER — FENTANYL CITRATE (PF) 100 MCG/2ML IJ SOLN
50.0000 ug | Freq: Once | INTRAMUSCULAR | Status: AC
  Administered 2018-11-04: 10:00:00 50 ug via INTRAVENOUS

## 2018-11-04 MED ORDER — FENTANYL CITRATE (PF) 100 MCG/2ML IJ SOLN
INTRAMUSCULAR | Status: AC
  Administered 2018-11-04: 50 ug via INTRAVENOUS
  Filled 2018-11-04: qty 2

## 2018-11-04 MED ORDER — INSULIN ASPART 100 UNIT/ML ~~LOC~~ SOLN
0.0000 [IU] | Freq: Three times a day (TID) | SUBCUTANEOUS | Status: DC
  Administered 2018-11-04: 16:00:00 5 [IU] via SUBCUTANEOUS
  Administered 2018-11-05: 16:00:00 2 [IU] via SUBCUTANEOUS
  Administered 2018-11-05: 3 [IU] via SUBCUTANEOUS
  Administered 2018-11-05 – 2018-11-06 (×2): 2 [IU] via SUBCUTANEOUS
  Administered 2018-11-06: 3 [IU] via SUBCUTANEOUS
  Administered 2018-11-06: 13:00:00 5 [IU] via SUBCUTANEOUS
  Administered 2018-11-07: 3 [IU] via SUBCUTANEOUS
  Administered 2018-11-07: 08:00:00 2 [IU] via SUBCUTANEOUS

## 2018-11-04 MED ORDER — LISINOPRIL 20 MG PO TABS
20.0000 mg | ORAL_TABLET | Freq: Every day | ORAL | Status: DC
  Administered 2018-11-04: 18:00:00 20 mg via ORAL
  Filled 2018-11-04: qty 1

## 2018-11-04 MED ORDER — LEVOTHYROXINE SODIUM 137 MCG PO TABS
137.0000 ug | ORAL_TABLET | Freq: Every day | ORAL | Status: DC
  Administered 2018-11-05 – 2018-11-07 (×3): 137 ug via ORAL
  Filled 2018-11-04 (×3): qty 1

## 2018-11-04 MED ORDER — VANCOMYCIN HCL 1 G IV SOLR
INTRAVENOUS | Status: DC | PRN
  Administered 2018-11-04: 1000 mg

## 2018-11-04 MED ORDER — ONDANSETRON HCL 4 MG/2ML IJ SOLN: 4.0000 mg | Freq: Once | INTRAMUSCULAR | Status: DC | PRN

## 2018-11-04 MED ORDER — CEFAZOLIN SODIUM-DEXTROSE 1-4 GM/50ML-% IV SOLN
1.0000 g | Freq: Three times a day (TID) | INTRAVENOUS | Status: AC
  Administered 2018-11-04 – 2018-11-05 (×3): 1 g via INTRAVENOUS
  Filled 2018-11-04 (×3): qty 50

## 2018-11-04 MED ORDER — PHENYLEPHRINE 40 MCG/ML (10ML) SYRINGE FOR IV PUSH (FOR BLOOD PRESSURE SUPPORT)
PREFILLED_SYRINGE | INTRAVENOUS | Status: AC
  Filled 2018-11-04: qty 10

## 2018-11-04 MED ORDER — METHOCARBAMOL 1000 MG/10ML IJ SOLN
500.0000 mg | Freq: Four times a day (QID) | INTRAVENOUS | Status: DC | PRN
  Filled 2018-11-04: qty 5

## 2018-11-04 MED ORDER — OXYCODONE HCL 5 MG PO TABS: 5.0000 mg | ORAL_TABLET | Freq: Once | ORAL | Status: DC | PRN

## 2018-11-04 MED ORDER — ZOLPIDEM TARTRATE 5 MG PO TABS: 5.0000 mg | ORAL_TABLET | Freq: Every evening | ORAL | Status: DC | PRN

## 2018-11-04 MED ORDER — DEXAMETHASONE SODIUM PHOSPHATE 10 MG/ML IJ SOLN
INTRAMUSCULAR | Status: AC
  Filled 2018-11-04: qty 1

## 2018-11-04 MED ORDER — DEXAMETHASONE SODIUM PHOSPHATE 10 MG/ML IJ SOLN
INTRAMUSCULAR | Status: DC | PRN
  Administered 2018-11-04: 10 mg via INTRAVENOUS

## 2018-11-04 MED ORDER — SODIUM CHLORIDE 0.9 % IV SOLN
INTRAVENOUS | Status: DC
  Administered 2018-11-04: 18:00:00 via INTRAVENOUS

## 2018-11-04 MED ORDER — EPHEDRINE 5 MG/ML INJ
INTRAVENOUS | Status: AC
  Filled 2018-11-04: qty 10

## 2018-11-04 MED ORDER — LIDOCAINE 2% (20 MG/ML) 5 ML SYRINGE
INTRAMUSCULAR | Status: DC | PRN
  Administered 2018-11-04: 40 mg via INTRAVENOUS
  Administered 2018-11-04: 60 mg via INTRAVENOUS

## 2018-11-04 MED ORDER — ONDANSETRON HCL 4 MG/2ML IJ SOLN
INTRAMUSCULAR | Status: DC | PRN
  Administered 2018-11-04: 4 mg via INTRAVENOUS

## 2018-11-04 MED ORDER — MIDAZOLAM HCL 2 MG/2ML IJ SOLN
2.0000 mg | Freq: Once | INTRAMUSCULAR | Status: AC
  Administered 2018-11-04: 10:00:00 2 mg via INTRAVENOUS

## 2018-11-04 MED ORDER — FENTANYL CITRATE (PF) 100 MCG/2ML IJ SOLN
INTRAMUSCULAR | Status: DC | PRN
  Administered 2018-11-04 (×4): 50 ug via INTRAVENOUS

## 2018-11-04 MED ORDER — HYDROCODONE-ACETAMINOPHEN 7.5-325 MG PO TABS
1.0000 | ORAL_TABLET | ORAL | Status: DC | PRN
  Administered 2018-11-05: 20:00:00 2 via ORAL
  Administered 2018-11-05 – 2018-11-06 (×2): 1 via ORAL
  Administered 2018-11-06: 2 via ORAL
  Filled 2018-11-04: qty 1
  Filled 2018-11-04 (×2): qty 2
  Filled 2018-11-04: qty 1

## 2018-11-04 MED ORDER — ROCURONIUM BROMIDE 50 MG/5ML IV SOSY
PREFILLED_SYRINGE | INTRAVENOUS | Status: DC | PRN
  Administered 2018-11-04: 10 mg via INTRAVENOUS
  Administered 2018-11-04: 60 mg via INTRAVENOUS

## 2018-11-04 MED ORDER — ONDANSETRON HCL 4 MG PO TABS: 4.0000 mg | ORAL_TABLET | Freq: Four times a day (QID) | ORAL | Status: DC | PRN

## 2018-11-04 MED ORDER — HYDROCODONE-ACETAMINOPHEN 5-325 MG PO TABS: 1.0000 | ORAL_TABLET | ORAL | Status: DC | PRN

## 2018-11-04 MED ORDER — MIDAZOLAM HCL 2 MG/2ML IJ SOLN
INTRAMUSCULAR | Status: AC
  Administered 2018-11-04: 10:00:00 2 mg via INTRAVENOUS
  Filled 2018-11-04: qty 2

## 2018-11-04 MED ORDER — PHENYLEPHRINE 40 MCG/ML (10ML) SYRINGE FOR IV PUSH (FOR BLOOD PRESSURE SUPPORT)
PREFILLED_SYRINGE | INTRAVENOUS | Status: DC | PRN
  Administered 2018-11-04: 80 ug via INTRAVENOUS

## 2018-11-04 MED ORDER — TRANEXAMIC ACID 1000 MG/10ML IV SOLN
2000.0000 mg | INTRAVENOUS | Status: AC
  Administered 2018-11-04: 2000 mg via TOPICAL
  Filled 2018-11-04: qty 20

## 2018-11-04 MED ORDER — PROPOFOL 10 MG/ML IV BOLUS
INTRAVENOUS | Status: AC
  Filled 2018-11-04: qty 20

## 2018-11-04 MED ORDER — INSULIN ASPART 100 UNIT/ML ~~LOC~~ SOLN
0.0000 [IU] | Freq: Every day | SUBCUTANEOUS | Status: DC
  Administered 2018-11-04: 4 [IU] via SUBCUTANEOUS
  Administered 2018-11-05: 21:00:00 2 [IU] via SUBCUTANEOUS

## 2018-11-04 MED ORDER — DOCUSATE SODIUM 100 MG PO CAPS
100.0000 mg | ORAL_CAPSULE | Freq: Two times a day (BID) | ORAL | Status: DC
  Administered 2018-11-04 – 2018-11-06 (×5): 100 mg via ORAL
  Filled 2018-11-04 (×7): qty 1

## 2018-11-04 MED ORDER — CEFAZOLIN SODIUM-DEXTROSE 2-4 GM/100ML-% IV SOLN
2.0000 g | INTRAVENOUS | Status: AC
  Administered 2018-11-04: 2 g via INTRAVENOUS
  Filled 2018-11-04: qty 100

## 2018-11-04 MED ORDER — VANCOMYCIN HCL 1000 MG IV SOLR
INTRAVENOUS | Status: AC
  Filled 2018-11-04: qty 1000

## 2018-11-04 MED ORDER — BUPIVACAINE HCL (PF) 0.5 % IJ SOLN
INTRAMUSCULAR | Status: DC | PRN
  Administered 2018-11-04: 15 mL via PERINEURAL

## 2018-11-04 MED ORDER — CHLORHEXIDINE GLUCONATE 4 % EX LIQD: 60.0000 mL | Freq: Once | CUTANEOUS | Status: DC

## 2018-11-04 MED ORDER — METHYLENE BLUE 0.5 % INJ SOLN
INTRAVENOUS | Status: DC | PRN
  Administered 2018-11-04: 1 mL

## 2018-11-04 SURGICAL SUPPLY — 96 items
AID PSTN UNV HD RSTRNT DISP (MISCELLANEOUS) ×1
APL PRP STRL LF DISP 70% ISPRP (MISCELLANEOUS) ×2
BAG DECANTER FOR FLEXI CONT (MISCELLANEOUS) ×2 IMPLANT
BASEPLATE GLENIOD RSA 28 (Shoulder) ×1 IMPLANT
BASEPLATE GLENIOD RSA 28MM (Shoulder) ×1 IMPLANT
BIT DRILL 3.1 DIA REUNION (BIT) ×1 IMPLANT
BIT DRILL 3.1MM DIA REUNION (BIT) ×1
BIT DRILL 5/64X5 DISP (BIT) ×3 IMPLANT
BLADE SAW SAG 73X25 THK (BLADE) ×2
BLADE SAW SGTL 73X25 THK (BLADE) ×1 IMPLANT
BLADE SURG 15 STRL LF DISP TIS (BLADE) ×1 IMPLANT
BLADE SURG 15 STRL SS (BLADE) ×3
BSPLAT GLND 28STRL LF SHLDR (Shoulder) ×1 IMPLANT
CABLE CERLAGE W/CRIMP 1.8 (Cable) ×3 IMPLANT
CABLE CERLAGE W/CRIMP 1.8MM (Cable) ×3 IMPLANT
CEMENT BONE SIMPLEX SPEEDSET (Cement) ×4 IMPLANT
CEMENT RESTRICTOR BONE PREP ST (KITS) ×1 IMPLANT
CHLORAPREP W/TINT 26 (MISCELLANEOUS) ×6 IMPLANT
CLEANER TIP ELECTROSURG 2X2 (MISCELLANEOUS) ×2 IMPLANT
CLOSURE STERI-STRIP 1/2X4 (GAUZE/BANDAGES/DRESSINGS) ×1
CLOSURE WOUND 1/2 X4 (GAUZE/BANDAGES/DRESSINGS) ×1
CLSR STERI-STRIP ANTIMIC 1/2X4 (GAUZE/BANDAGES/DRESSINGS) ×1 IMPLANT
CONT SPEC 4OZ CLIKSEAL STRL BL (MISCELLANEOUS) ×2 IMPLANT
COVER SURGICAL LIGHT HANDLE (MISCELLANEOUS) ×3 IMPLANT
COVER WAND RF STERILE (DRAPES) ×3 IMPLANT
CUP HUMERAL RSA REUNION 32X4MM (Shoulder) IMPLANT
CUP HUMERAL THK 32X10 (Cup) ×2 IMPLANT
DRAPE INCISE IOBAN 66X45 STRL (DRAPES) ×3 IMPLANT
DRAPE ORTHO SPLIT 77X108 STRL (DRAPES) ×6
DRAPE SURG 17X23 STRL (DRAPES) ×3 IMPLANT
DRAPE SURG ORHT 6 SPLT 77X108 (DRAPES) ×2 IMPLANT
DRAPE U-SHAPE 47X51 STRL (DRAPES) ×6 IMPLANT
DRSG AQUACEL AG ADV 3.5X10 (GAUZE/BANDAGES/DRESSINGS) ×3 IMPLANT
ELECT BLADE 4.0 EZ CLEAN MEGAD (MISCELLANEOUS)
ELECT REM PT RETURN 9FT ADLT (ELECTROSURGICAL) ×3
ELECTRODE BLDE 4.0 EZ CLN MEGD (MISCELLANEOUS) IMPLANT
ELECTRODE REM PT RTRN 9FT ADLT (ELECTROSURGICAL) ×1 IMPLANT
GAUZE SPONGE 4X4 12PLY STRL LF (GAUZE/BANDAGES/DRESSINGS) IMPLANT
GLENOID CONC REUNION 32X6 (Shoulder) ×2 IMPLANT
GLOVE BIOGEL PI IND STRL 8 (GLOVE) ×1 IMPLANT
GLOVE BIOGEL PI INDICATOR 8 (GLOVE) ×2
GLOVE SURG SYN 7.5  E (GLOVE) ×2
GLOVE SURG SYN 7.5 E (GLOVE) ×1 IMPLANT
GLOVE SURG SYN 7.5 PF PI (GLOVE) ×1 IMPLANT
GOWN STRL REUS W/ TWL LRG LVL3 (GOWN DISPOSABLE) ×2 IMPLANT
GOWN STRL REUS W/ TWL XL LVL3 (GOWN DISPOSABLE) ×1 IMPLANT
GOWN STRL REUS W/TWL LRG LVL3 (GOWN DISPOSABLE) ×6
GOWN STRL REUS W/TWL XL LVL3 (GOWN DISPOSABLE) ×3
GUIDEWIRE SHLD REUNION 19 (WIRE) ×2 IMPLANT
HANDPIECE INTERPULSE COAX TIP (DISPOSABLE) ×3
HEMOSTAT SURGICEL 2X14 (HEMOSTASIS) ×3 IMPLANT
INSERT HUM REUNION 32X6 (Insert) IMPLANT
INSERT HUMERAL CR X3 32X4 (Insert) ×2 IMPLANT
KIT BASIN OR (CUSTOM PROCEDURE TRAY) ×3 IMPLANT
KIT BONE PREP STRYKER (KITS) ×1
KIT TURNOVER KIT B (KITS) ×3 IMPLANT
MANIFOLD NEPTUNE II (INSTRUMENTS) ×3 IMPLANT
NDL MAYO TROCAR (NEEDLE) ×1 IMPLANT
NEEDLE MAYO TROCAR (NEEDLE) ×3 IMPLANT
NS IRRIG 1000ML POUR BTL (IV SOLUTION) ×3 IMPLANT
PACK SHOULDER (CUSTOM PROCEDURE TRAY) ×3 IMPLANT
PAD ARMBOARD 7.5X6 YLW CONV (MISCELLANEOUS) ×6 IMPLANT
RESTRAINT HEAD UNIVERSAL NS (MISCELLANEOUS) ×3 IMPLANT
RETRIEVER SUT HEWSON (MISCELLANEOUS) ×3 IMPLANT
SCREW PERIPH 4.5X32 (Screw) ×2 IMPLANT
SCREW PERIPH 6.5X32 (Screw) ×2 IMPLANT
SCREW PERIPHERAL 4.5X16MM (Screw) ×2 IMPLANT
SCREW PERIPHERAL 4.5X24MM (Screw) ×2 IMPLANT
SET HNDPC FAN SPRY TIP SCT (DISPOSABLE) ×1 IMPLANT
SLING ARM FOAM STRAP LRG (SOFTGOODS) ×3 IMPLANT
SLING ARM FOAM STRAP MED (SOFTGOODS) IMPLANT
SMARTMIX MINI TOWER (MISCELLANEOUS) ×3
SPONGE LAP 18X18 RF (DISPOSABLE) ×3 IMPLANT
SPONGE LAP 4X18 RFD (DISPOSABLE) IMPLANT
STEM HUM REUNION 8X200 LONG (Stem) ×2 IMPLANT
STEM HUM REUNION 9X118 (Stem) IMPLANT
STRIP CLOSURE SKIN 1/2X4 (GAUZE/BANDAGES/DRESSINGS) ×2 IMPLANT
SUCTION FRAZIER HANDLE 10FR (MISCELLANEOUS) ×2
SUCTION TUBE FRAZIER 10FR DISP (MISCELLANEOUS) ×1 IMPLANT
SUPPORT WRAP ARM LG (MISCELLANEOUS) ×3 IMPLANT
SUT ETHIBOND NAB CT1 #1 30IN (SUTURE) IMPLANT
SUT FIBERWIRE #2 38 REV NDL BL (SUTURE) ×9
SUT FIBERWIRE #2 38 T-5 BLUE (SUTURE)
SUT MNCRL AB 4-0 PS2 18 (SUTURE) IMPLANT
SUT PROLENE 4 0 PS 2 18 (SUTURE) ×2 IMPLANT
SUT VIC AB 1 CT1 27 (SUTURE) ×6
SUT VIC AB 1 CT1 27XBRD ANBCTR (SUTURE) IMPLANT
SUT VIC AB 2-0 CT1 27 (SUTURE)
SUT VIC AB 2-0 CT1 TAPERPNT 27 (SUTURE) IMPLANT
SUTURE FIBERWR #2 38 T-5 BLUE (SUTURE) IMPLANT
SUTURE FIBERWR#2 38 REV NDL BL (SUTURE) IMPLANT
TAPE LABRALWHITE 1.5X36 (TAPE) ×16 IMPLANT
TAPE SUT LABRALTAP WHT/BLK (SUTURE) IMPLANT
TOWEL GREEN STERILE (TOWEL DISPOSABLE) ×3 IMPLANT
TOWER SMARTMIX MINI (MISCELLANEOUS) ×1 IMPLANT
YANKAUER SUCT BULB TIP NO VENT (SUCTIONS) ×2 IMPLANT

## 2018-11-04 NOTE — Plan of Care (Signed)

## 2018-11-04 NOTE — Transfer of Care (Signed)
Immediate Anesthesia Transfer of Care Note  Patient: Sabrina Deleon  Procedure(s) Performed: REVERSE SHOULDER ARTHROPLASTY (Right Shoulder)  Patient Location: PACU  Anesthesia Type:GA combined with regional for post-op pain  Level of Consciousness: awake, drowsy and patient cooperative  Airway & Oxygen Therapy: Patient Spontanous Breathing  Post-op Assessment: Report given to RN and Post -op Vital signs reviewed and stable  Post vital signs: Reviewed and stable  Last Vitals:  Vitals Value Taken Time  BP 131/77 11/04/18 1531  Temp    Pulse 116 11/04/18 1535  Resp 16 11/04/18 1535  SpO2 92 % 11/04/18 1535  Vitals shown include unvalidated device data.  Last Pain:  Vitals:   11/04/18 0932  TempSrc:   PainSc: 7       Patients Stated Pain Goal: 3 (58/30/94 0768)  Complications: No apparent anesthesia complications

## 2018-11-04 NOTE — Op Note (Signed)
PREOPERATIVE DIAGNOSIS: Right displaced four-part proximal humerus fracture  POSTOPERATIVE DIAGNOSIS: Same with intraoperative humeral shaft fracture  ATTENDING PHYSICIAN: Maudry Mayhew. Jeannie Fend, III, MD who was present and scrubbed for the entire case   ASSISTANT SURGEON: None.   ANESTHESIA: Regional and general  SURGICAL PROCEDURES: Right shoulder reverse arthroplasty for displaced four-part proximal humerus fracture  SURGICAL INDICATIONS: Patient is a 68 year old female who presented to clinic last week.  She tripped over a dog and landed onto the right shoulder.  She is found to have a displaced four-part proximal humerus fracture.  After discussing treatment options we did agree to proceed with a right shoulder reverse arthroplasty.  This was primarily based on the fracture displacement, impaction and joint incongruity.  After discussing treatment options she did agree to proceed and presented today for that.  FINDING: There is highly comminuted right proximal humerus fracture.  There was a main humeral head piece, greater and lesser tuberosity pieces as well as the shaft.  A 32+6 glenosphere was successfully placed on the glenoid with secure fixation with 3 locking screws and the initial central screw.  On placement of the initial humeral stem the humeral shaft fractured and was subsequently removed.  Cables were placed around the fracture and a longer, 200 mm Stryker fracture stem was placed.  This resulted in a stable reverse shoulder arthroplasty following repair of her fracture.  DESCRIPTION OF PROCEDURE: Patient was identified in preop holding area where the risk benefits and alternatives of the procedure were discussed the patient.  These include but not limited to infection, bleeding, damage to surrounding structures including blood vessels and nerves, pain, stiffness, implant failure and need for additional procedures.  Informed consent was obtained at that time patient's right shoulder was  marked with surgical marking pen.  She then underwent a right upper extremity plexus block.  She was then brought to the operative suite where timeout was performed identifying the correct patient operative site.  She was positioned in the beachchair position and the right upper extremity was prepped and draped in usual sterile fashion.  A longitudinal incision was made in the deltopectoral interval.  Dissection through subcutaneous tissues was performed using electrocautery.  The deltopectoral interval was then identified and subdeltoid space was developed.  The conjoined tendon was mobilized medially.  The proximal humerus found to be highly comminuted with multiple small fractures throughout the greater and lesser tuberosities.  The bicep tendon was identified and tenodesed to the pec.  A Cobb was used to elevate both the greater and lesser tuberosities as well as the impacted humeral head.  This was removed and placed on the back table for later autograft.  The tuberosities were carefully mobilized and stay sutures were placed in the superior aspects of the rotator cuff.  A bone amount of hematoma and comminuted fragments with were removed from the surrounding proximal humerus.  Once full visualization of proximal humerus was achieved the canal was reamed up to a size 11.  This was planning for a cemented size 11 fracture stem.  The trial component was placed and expanded holding it and appropriate position which was nicely seated against the medial calcar.  Attention was then turned to the glenoid preparation.  Remaining cartilage was scraped off using a Cobb after full circumferential soft tissue release was performed.  Once adequate visualization of the glenoid was achieved starting pin was placed in the center portion of the glenoid and 10 degrees inferior tilt.  The glenoid was then reamed  using 28 and a 32 mm reamer achieving subchondral bone visualization throughout the inferior aspect of the glenoid.   The central screw which was a 32 mm was then advanced through the baseplate and had secure fixation of the glenoid baseplate.  3 locking screws placed superior, inferior and anterior were then achieved.  There was an adequate bone posterior for a adequate screw length.  The 32+6 glenosphere was then impacted and taper was seated securing the glenosphere to the baseplate.  Attention was then turned back to the proximal humerus.  Multiple fiber tape sutures were placed through the supraspinatus at the junction of the bone at the greater tuberosity.  Cement was restrictor was placed into the canal and the canal was filled with cement.  Additional sutures were attempted be placed through the shaft for vertical suture fixation of the tuberosities though this was unable to be achieved.  The stem was then inserted and upon inserting stem the humeral shaft cracked extending distally from the proximal fracture.  The stem and previously placement were all removed.  The fracture was reduced with a clamp as well as a fiber tape secured with a niece not.  Multiple cerclage cables were placed about the fracture working distally as skin incision was extended.  The fracture was reduced anatomically and secured using the multiple cerclage wires.  A trial 200 mm fracture stem was then placed as a size 8 after reaming the canal once again to a greater depth.  The stem was found to have appropriate alignment and placement.  Fluoroscopic images were obtained which showed fracture fixation as well as a stent extending past fracture.  The canal was then cleaned and cement was once again placed in the canal and.  Bone graft was placed proximally around proximal aspect of the stem.  The stem was then carefully inserted in the shaft and held into place to allow for full cement fixation.  This was placed in approximately 30 degrees retroversion.  All cables were then cut and secured finally.  Trial humeral component was then placed and the  shoulder was reduced.  This was found to be most stable with a size 10 tray and a 4 mm poly-.  The trial components were all removed and the final humeral tray and cup were then impacted on the [facility] taper.  The shoulder was reduced.  This was found to be stable throughout an arc of range of motion.  The previously placed fiber tapes through the supraspinatus were then passed through the holes in the stem and then through the subscapularis.  These were tied down over top reducing the greater and lesser tuberosities back to the anatomic position.  Vertical sutures which had been previously placed drilled for and had sutures last through the humeral shaft were then also tied down over top of the supraspinatus and subscapularis.  These were securing their fixation through gentle arc of motion.  Wound was then copiously irrigated with normal saline.  TXA was placed in the wound followed by vancomycin powder.  A drain was placed.  The deltopectoral fascia was then closed with interrupted #1 Vicryl sutures.  The skin was then closed with interrupted 2-0 Vicryl stitches followed by running 3-0 Monocryl suture.  Steri-Strips were placed.  An Aquasol dressing was placed and the arm was placed into a shoulder immobilizer.  The patient was awoken from anesthesia and extubated in the operating without a complications.  She was taken to the PACU in stable condition.  ESTIMATED BLOOD LOSS: 400 mL's   SPECIMENS: None  POSTOPERATIVE PLAN: The patient will be admitted to the the floor for standard postoperative care following a right shoulder arthroplasty.  We will keep her drain in place likely until tomorrow or the the following day.  She will stay in her shoulder immobilizer until 6 weeks out from surgery.  Provided she is doing well at that point we will begin some gentle shoulder range of motion.  IMPLANTS: Stryker 200 mm size 8 fracture stem with 10 mm humeral tray and 4 mm constrained poly-.  Standard baseplate  with 12+4 glenosphere and 32 mm central screw and 3 peripheral screws.

## 2018-11-04 NOTE — Consult Note (Addendum)
Medical Consultation   Sabrina Deleon  AST:[id number]  DOB: 1951-01-12  DOA: 11/04/2018  PCP: Kathyrn Drown, MD     Requesting physician: Harlin Heys III, MD  Reason for consultation: Medical management  History of Present Illness: Sabrina Deleon is an 68 y.o. female with pmh of hypertension, diabetes mellitus type 2, aortic stenosis, hypothyroidism, morbid obesity, and gout; who reportedly fell over a [place] puppy while at work on 8/13.  She landed on her right upper arm and hit the right side of her face.  Imaging studies revealed a right displaced proximal humerus fracture and tiny fracture within the inferior right orbit with associated hematomas of the frontal scalp and periorbital region.  Patient was taken to surgery today by Dr. Jeannie Fend for right shoulder reverse arthroplasty.  Labs revealed WBC 8.8, hemoglobin 10.5, platelets 198, sodium 134, BUN 38, creatinine 1.69, glucose 223, and hemoglobin A1c 6.5. [facility] called to help with medical management following the procedure.  Patient denies having any complaints of shortness of breath, fever, chills, nausea, vomiting, or diarrhea.  Does admit to some lower extremity swelling for which she normally utilizes compression stockings.  At home patient reports blood sugars are normally well controlled and never in the 200s.     Review of Systems:  Review of Systems  Musculoskeletal: Positive for falls and joint pain.   As per HPI otherwise 10 point review of systems negative.   Past Medical History:  Past Medical History:  Diagnosis Date  . Anxiety   . Aortic stenosis   . Arthritis   . Chronic kidney disease    Dr. Tami Ribas- noted increase kidney function levels- took pt off Metformin and started on Onglyza for Blood sugar control-pt being followed for this.  Marland Kitchen GERD (gastroesophageal reflux disease)   . Gout   . Heart murmur   . Hypertension   . Hypothyroidism   . Morbid obesity ([place]) 03/29/2018   Patient has elevated BMI with diabetes hyperlipidemia and hypertension  . Pancreatitis   . Type 2 diabetes mellitus ([place])   . Wears glasses   . Wears partial dentures    Upper    Past Surgical History: Past Surgical History:  Procedure Laterality Date  . BACK SURGERY     Lumbar  . CARPAL TUNNEL RELEASE  11/02/2011   Procedure: CARPAL TUNNEL RELEASE;  Surgeon: Tennis Must, MD;  Location: [place];  Service: Orthopedics;  Laterality: Right;  . CARPAL TUNNEL RELEASE Left 01/14/2015   Procedure: LEFT CARPAL TUNNEL RELEASE;  Surgeon: Leanora Cover, MD;  Location: [place];  Service: Orthopedics;  Laterality: Left;  . CHOLECYSTECTOMY     laparoscopic  . COLONOSCOPY    . COLONOSCOPY N/A 11/12/2015   Procedure: COLONOSCOPY;  Surgeon: Rogene Houston, MD;  [facility];  Service: Endoscopy;  Laterality: N/A;  9:30  . ESOPHAGOGASTRODUODENOSCOPY N/A 11/12/2015   Procedure: ESOPHAGOGASTRODUODENOSCOPY (EGD);  Surgeon: Rogene Houston, MD;  [facility];  Service: Endoscopy;  Laterality: N/A;  . RIGHT/LEFT HEART CATH AND CORONARY ANGIOGRAPHY N/A 10/18/2017   Procedure: RIGHT/LEFT HEART CATH AND CORONARY ANGIOGRAPHY;  Surgeon: Burnell Blanks, MD;  Location: [place] [facility];  Service: Cardiovascular;  Laterality: N/A;  . TOTAL HIP ARTHROPLASTY Left 03/22/2016   Procedure: LEFT TOTAL HIP ARTHROPLASTY ANTERIOR APPROACH;  Surgeon: Gaynelle Arabian, MD;  [facility];  Service:  Orthopedics;  Laterality: Left;     Allergies:  No Known Allergies   Social History:  reports that she quit smoking about 37 years ago. Her smoking use included cigarettes. She has never used smokeless tobacco. She reports current alcohol use. She reports that she does not use drugs.   Family History: History reviewed. No pertinent family history.    Physical Exam: Vitals:   11/04/18 0904 11/04/18 0914  BP:  122/63  Pulse: 82   Resp: 16   Temp: 98.4 F  (36.9 C)   TempSrc: Oral   SpO2: 97%   Weight: 115.2 kg   Height: 5\' 8"  (1.727 m)     Constitutional: Older female who is currently alert and awake, oriented x3, not in any acute distress. Eyes: PERLA, periorbital bruising present of the right arm. ENMT: external ears and nose appear normal, decreased hearing with hearing aids currently not in place. Lips appears normal, oropharynx mucosa, tongue, posterior pharynx appear normal  Neck: neck appears normal, no masses, normal ROM, no thyromegaly, no JVD  CVS: Regular rate and rhythm with positive 2/6 crescendo decrescendo murmur present.  Trace bilateral lower extremity swelling appreciated. Respiratory:  clear to auscultation bilaterally, no wheezing, rales or rhonchi. Respiratory effort normal. No accessory muscle use.  Abdomen: soft nontender, nondistended, normal bowel sounds, no hepatosplenomegaly, no hernias   Musculoskeletal: Right shoulder in sling Neuro: Cranial nerves II-XII intact, strength, sensation, reflexes Psych: judgement and insight appear normal, stable mood and affect, mental status Skin: Bruising present of the right side of the face.  Data reviewed:  I have personally reviewed following labs and imaging studies Labs:  CBC: Recent Labs  Lab 11/04/18 0954  WBC 8.8  HGB 10.5*  HCT 32.3*  MCV 93.4  PLT 725    Basic Metabolic Panel: Recent Labs  Lab 11/04/18 0954  NA 134*  K 4.2  CL 100  CO2 24  GLUCOSE 223*  BUN 38*  CREATININE 1.69*  CALCIUM 9.6   GFR Estimated Creatinine Clearance: 43 mL/min (A) (by C-G formula based on SCr of 1.69 mg/dL (H)). Liver Function Tests: No results for input(s): AST, ALT, ALKPHOS, BILITOT, PROT, ALBUMIN in the last 168 hours. No results for input(s): LIPASE, AMYLASE in the last 168 hours. No results for input(s): AMMONIA in the last 168 hours. Coagulation profile No results for input(s): INR, PROTIME in the last 168 hours.  Cardiac Enzymes: No results for input(s):  CKTOTAL, CKMB, CKMBINDEX, TROPONINI in the last 168 hours. BNP: Invalid input(s): POCBNP CBG: Recent Labs  Lab 11/04/18 0911  GLUCAP 229*   D-Dimer No results for input(s): DDIMER in the last 72 hours. Hgb A1c Recent Labs    11/04/18 0954  HGBA1C 6.5*   Lipid Profile No results for input(s): CHOL, HDL, LDLCALC, TRIG, CHOLHDL, LDLDIRECT in the last 72 hours. Thyroid function studies No results for input(s): TSH, T4TOTAL, T3FREE, THYROIDAB in the last 72 hours.  Invalid input(s): FREET3 Anemia work up No results for input(s): VITAMINB12, FOLATE, FERRITIN, TIBC, IRON, RETICCTPCT in the last 72 hours. Urinalysis    Component Value Date/Time   COLORURINE YELLOW 03/15/2016 0900   APPEARANCEUR CLEAR 03/15/2016 0900   LABSPEC 1.016 03/15/2016 0900   PHURINE 6.0 03/15/2016 0900   GLUCOSEU NEGATIVE 03/15/2016 0900   HGBUR NEGATIVE 03/15/2016 0900   BILIRUBINUR NEGATIVE 03/15/2016 0900   KETONESUR NEGATIVE 03/15/2016 0900   PROTEINUR NEGATIVE 03/15/2016 0900   NITRITE NEGATIVE 03/15/2016 0900   LEUKOCYTESUR TRACE (A) 03/15/2016 0900  Microbiology Recent Results (from the past 240 hour(s))  SARS CORONAVIRUS 2 Nasal Swab Aptima Multi Swab     Status: None   Collection Time: 11/02/18 11:32 AM   Specimen: Aptima Multi Swab; Nasal Swab  Result Value Ref Range Status   SARS Coronavirus 2 NEGATIVE NEGATIVE Final    Comment: (NOTE) SARS-CoV-2 target nucleic acids are NOT DETECTED. The SARS-CoV-2 RNA is generally detectable in upper and lower respiratory specimens during the acute phase of infection. Negative results do not preclude SARS-CoV-2 infection, do not rule out co-infections with other pathogens, and should not be used as the sole basis for treatment or other patient management decisions. Negative results must be combined with clinical observations, patient history, and epidemiological information. The expected result is Negative. Fact Sheet for Patients:  SugarRoll.be Fact Sheet for Healthcare Providers: https://www.woods-mathews.com/ This test is not yet approved or cleared by the [place] FDA and  has been authorized for detection and/or diagnosis of SARS-CoV-2 by FDA under an Emergency Use Authorization (EUA). This EUA will remain  in effect (meaning this test can be used) for the duration of the COVID-19 declaration under Section 56 4(b)(1) of the Act, 21 U.S.C. section 360bbb-3(b)(1), unless the authorization is terminated or revoked sooner. Performed at [facility], [place] [address] [place], [place] [address]        Inpatient Medications:   Scheduled Meds: . chlorhexidine  60 mL Topical Once  . docusate sodium  100 mg Oral BID  . feeding supplement  296 mL Oral Once   Continuous Infusions: . sodium chloride    .  ceFAZolin (ANCEF) IV    . lactated ringers 10 mL/hr at 11/04/18 0937  . methocarbamol (ROBAXIN) IV       Radiological Exams on Admission: Dg Humerus Right  Result Date: 11/04/2018 CLINICAL DATA:  ORIF of right humeral fracture EXAM: RIGHT HUMERUS - 2+ VIEW; DG C-ARM 61-120 MIN COMPARISON:  10/31/2018 FLUOROSCOPY TIME:  Fluoroscopy Time:  32 seconds Radiation Exposure Index (if provided by the fluoroscopic device): Not available Number of Acquired Spot Images: 4 FINDINGS: Right shoulder replacement is noted with cerclage wire surrounding the proximal right humerus. IMPRESSION: ORIF of right humerus with shoulder replacement Electronically Signed   By: Inez Catalina M.D.   On: 11/04/2018 14:44   Dg C-arm 1-60 Min  Result Date: 11/04/2018 CLINICAL DATA:  ORIF of right humeral fracture EXAM: RIGHT HUMERUS - 2+ VIEW; DG C-ARM 61-120 MIN COMPARISON:  10/31/2018 FLUOROSCOPY TIME:  Fluoroscopy Time:  32 seconds Radiation Exposure Index (if provided by the fluoroscopic device): Not available Number of Acquired Spot Images: 4 FINDINGS: Right shoulder replacement is noted  with cerclage wire surrounding the proximal right humerus. IMPRESSION: ORIF of right humerus with shoulder replacement Electronically Signed   By: Inez Catalina M.D.   On: 11/04/2018 14:44    Impression/Recommendations Closed fracture of right proximal humerus: Patient fell over a dog while at work landing on her right arm suffering proximal humerus fracture.  Currently from postop right shoulder reverse arthroplasty. -Continue current pain regimen -Per orthopedics   Facial fracture: Patient reports that she was advised to follow-up with Dr. Constance Holster of ENT. -Continue symptomatic treatment -Continue outpatient follow-up with Dr. Constance Holster  Anemia of chronic disease: Hemoglobin previously noted to be around 11, but acutely 10.5 on admission.  Patient did have significant amount of bruising of the face and is currently postop.  Vital signs otherwise stable at this time. -Recheck hemoglobin in a.m.  Diabetes mellitus type 2: Glucose 271 following surgery.  Last hemoglobin A1c 6.5 on 8/17.  Patient on oral medications of glipizide at home.  Suspect acute elevation related with recent surgery. -Hypoglycemic protocols -Heart healthy and carb modified diet -Hold glipizide  -CBGs q. before meals with sensitive SSI    Chronic kidney disease stage III/IV: Patient presents with creatinine elevated up to 1.69 with BUN 38 on admission.  Baseline creatinine appears to range from 1.5-1.6. -Recheck creatinine in a.m.   Aortic stenosis: Last EF noted to be 60 to 65% with grade 1 diastolic dysfunction and severe aortic stenosis in 08/2017.  Patient does not appear to be acutely fluid overloaded at this time and denies any complaints of shortness of breath. -Continue outpatient follow-up with Dr. Rozann Lesches cardiology  Essential hypertension: Blood pressures currently stable at this time. -Continue furosemide and lisinopril in a.m.  Hypothyroidism: Last TSH noted to be 4.410 on 09/06/2018. -Check TSH in a.m.  -Continue levothyroxine  History of anxiety and depression  -Continue Celexa  Insomnia -Continue Ambien  Thank you for this consultation.  Our [facility] hospitalist team will follow the patient with you.   Time Spent: 20 minutes  Norval Morton M.D. [facility] 11/04/2018, 3:06 PM

## 2018-11-04 NOTE — H&P (Signed)
ORTHOPAEDIC H&P  PCP:  Kathyrn Drown, MD  Chief Complaint: Right shoulder fracture  HPI: Sabrina Deleon is a 68 y.o. female who complains of  Shoulder fracture. She was seen by me in clinic where she was found to have a displaced right proximal humerus fracture after she tripped over her dog. She was initially seen at [facility] ED where radiographs and CT of the shoulder were obtained. After discussing treatment options, she presented today for right shoulder reverse arthroplasty for fracture  Past Medical History:  Diagnosis Date   Anxiety    Aortic stenosis    Arthritis    Chronic kidney disease    Dr. Tami Ribas- noted increase kidney function levels- took pt off Metformin and started on Onglyza for Blood sugar control-pt being followed for this.   GERD (gastroesophageal reflux disease)    Gout    Heart murmur    Hypertension    Hypothyroidism    Morbid obesity ([place]) 03/29/2018   Patient has elevated BMI with diabetes hyperlipidemia and hypertension   Pancreatitis    Type 2 diabetes mellitus ([place])    Wears glasses    Wears partial dentures    Upper   Past Surgical History:  Procedure Laterality Date   BACK SURGERY     Lumbar   CARPAL TUNNEL RELEASE  11/02/2011   Procedure: CARPAL TUNNEL RELEASE;  Surgeon: Tennis Must, MD;  Location: [place];  Service: Orthopedics;  Laterality: Right;   CARPAL TUNNEL RELEASE Left 01/14/2015   Procedure: LEFT CARPAL TUNNEL RELEASE;  Surgeon: Leanora Cover, MD;  Location: [place];  Service: Orthopedics;  Laterality: Left;   CHOLECYSTECTOMY     laparoscopic   COLONOSCOPY     COLONOSCOPY N/A 11/12/2015   Procedure: COLONOSCOPY;  Surgeon: Rogene Houston, MD;  [facility];  Service: Endoscopy;  Laterality: N/A;  9:30   ESOPHAGOGASTRODUODENOSCOPY N/A 11/12/2015   Procedure: ESOPHAGOGASTRODUODENOSCOPY (EGD);  Surgeon: Rogene Houston, MD;  [facility];  Service:  Endoscopy;  Laterality: N/A;   RIGHT/LEFT HEART CATH AND CORONARY ANGIOGRAPHY N/A 10/18/2017   Procedure: RIGHT/LEFT HEART CATH AND CORONARY ANGIOGRAPHY;  Surgeon: Burnell Blanks, MD;  Location: [place] [facility];  Service: Cardiovascular;  Laterality: N/A;   TOTAL HIP ARTHROPLASTY Left 03/22/2016   Procedure: LEFT TOTAL HIP ARTHROPLASTY ANTERIOR APPROACH;  Surgeon: Gaynelle Arabian, MD;  [facility];  Service: Orthopedics;  Laterality: Left;   Social History   Socioeconomic History   Marital status: Married    Spouse name: Not on file   Number of children: Not on file   Years of education: Not on file   Highest education level: Not on file  Occupational History   Not on file  Social Needs   Financial resource strain: Not on file   Food insecurity    Worry: Not on file    Inability: Not on file   Transportation needs    Medical: Not on file    Non-medical: Not on file  Tobacco Use   Smoking status: Former Smoker    Types: Cigarettes    Quit date: 10/29/1981    Years since quitting: 37.0   Smokeless tobacco: Never Used  Substance and Sexual Activity   Alcohol use: Yes    Comment: occ   Drug use: No   Sexual activity: Never  Lifestyle   Physical activity    Days per week: Not on file    Minutes  per session: Not on file   Stress: Not on file  Relationships   Social connections    Talks on phone: Not on file    Gets together: Not on file    Attends religious service: Not on file    Active member of club or organization: Not on file    Attends meetings of clubs or organizations: Not on file    Relationship status: Not on file  Other Topics Concern   Not on file  Social History Narrative   Not on file   History reviewed. No pertinent family history. No Known Allergies Prior to Admission medications   Medication Sig Start Date End Date Taking? Authorizing Provider  allopurinol (ZYLOPRIM) 100 MG tablet TAKE 1 TABLET BY MOUTH TWICE A DAY  05/27/18  Yes Kathyrn Drown, MD  aspirin EC 81 MG tablet Take 81 mg by mouth daily.   Yes [provider]  Cholecalciferol (VITAMIN D PO) Take by mouth daily.   Yes [provider]  citalopram (CELEXA) 20 MG tablet TAKE 1 AND 1/2 TABLETS BY MOUTH EVERY DAY 10/18/18  Yes Luking, Scott A, MD  ferrous sulfate 325 (65 FE) MG tablet Take 325 mg by mouth daily with breakfast.   Yes [provider]  furosemide (LASIX) 40 MG tablet TAKE 1 TABLET(40 MG) BY MOUTH DAILY 08/26/18  Yes Luking, Scott A, MD  glipiZIDE (GLUCOTROL) 5 MG tablet TAKE 1/2 TABLET BY MOUTH TWICE DAILY 03/11/18  Yes Luking, Elayne Snare, MD  HYDROcodone-acetaminophen (NORCO/VICODIN) 5-325 MG tablet Take 0.5 tablets by mouth every 8 (eight) hours as needed for severe pain. 10/11/18  Yes Luking, Elayne Snare, MD  ketoconazole (NIZORAL) 2 % cream Apply 1 application topically as needed for irritation. 10/12/17  Yes Nilda Simmer, NP  levothyroxine (SYNTHROID) 137 MCG tablet Take 1 tablet (137 mcg total) by mouth daily before breakfast. 09/12/18  Yes Pearson Forster C, NP  lisinopril (ZESTRIL) 20 MG tablet TAKE 1 TABLET BY MOUTH EVERY DAY 09/02/18  Yes Kathyrn Drown, MD  pantoprazole (PROTONIX) 40 MG tablet TAKE 1 TABLET BY MOUTH EVERY DAY 05/16/18  Yes Kathyrn Drown, MD  pravastatin (PRAVACHOL) 40 MG tablet TAKE 1 TABLET BY MOUTH EVERY DAY 09/02/18  Yes Kathyrn Drown, MD  zolpidem (AMBIEN) 5 MG tablet Take one tablet by mouth each night at bedtime 10/08/18  Yes Luking, Scott A, MD  glucose blood test strip 1 each by Other route as needed for other. Use as instructed 10/14/18   Nilda Simmer, NP  meclizine (ANTIVERT) 25 MG tablet Take 1 tablet (25 mg total) by mouth 3 (three) times daily as needed for dizziness. Patient not taking: Reported on 10/31/2018 10/17/18   Kathyrn Drown, MD  ondansetron (ZOFRAN) 8 MG tablet Take 1 tablet (8 mg total) by mouth every 8 (eight) hours as needed for nausea or vomiting. Patient not  taking: Reported on 10/31/2018 10/17/18   Kathyrn Drown, MD  oxyCODONE-acetaminophen (PERCOCET) 5-325 MG tablet Take 1 tablet by mouth every 4 (four) hours as needed for severe pain. 10/31/18 10/31/19  Fransico Meadow, PA-C  zolpidem (AMBIEN) 10 MG tablet TAKE 1 TABLET(10 MG) BY MOUTH AT BEDTIME Patient not taking: Reported on 10/31/2018 08/22/18   Kathyrn Drown, MD   No results found.  Positive ROS: All other systems have been reviewed and were otherwise negative with the exception of those mentioned in the HPI and as above.  Physical Exam: General: Alert, no acute  distress Cardiovascular: No pedal edema Respiratory: No cyanosis, no use of accessory musculature Skin: No lesions in the area of chief complaint Neurologic: Sensation intact distally Psychiatric: Patient is competent for consent with normal mood and affect Lymphatic: No axillary or cervical lymphadenopathy  MUSCULOSKELETAL: Examination of the right arm shows a grossly normal-appearing arm. It is held in a sling. She has tenderness to palpation diffusely about the right shoulder. She has no erythema, lymphadenopathy, or signs of infection. She has some mild swelling to the right shoulder. There is no skin tenting or ecchymosis. She has significant pain with attempted range of motion of the shoulder both passive and active. Her sensation is intact to light touch to median, radial, ulnar and axillary nerve distributions. Her fingertips are warm and well-perfused with brisk capillary refill.  Assessment: Right displaced 4 part proximal humerus fracture  Plan: - OR today for right shoulder reverse arthroplasty for fracture - r/b/a discussed with patient once again discussed with the patient today. Consent obtained. Marked - Plan for admission post op for standard post operative care. Plan to consult hospitalist for co-management    Verner Mould, MD Cell [phone]   11/04/2018 10:25 AM

## 2018-11-04 NOTE — Anesthesia Procedure Notes (Signed)
Anesthesia Regional Block: Interscalene brachial plexus block   Pre-Anesthetic Checklist: ,, timeout performed, Correct Patient, Correct Site, Correct Laterality, Correct Procedure, Correct Position, site marked, Risks and benefits discussed,  Surgical consent,  Pre-op evaluation,  At surgeon's request and post-op pain management  Laterality: Right  Prep: chloraprep       Needles:  Injection technique: Single-shot  Needle Type: Echogenic Stimulator Needle     Needle Length: 9cm  Needle Gauge: 21     Additional Needles:   Procedures:,,,, ultrasound used (permanent image in chart),,,,  Narrative:  Start time: 11/04/2018 10:00 AM End time: 11/04/2018 10:04 AM Injection made incrementally with aspirations every 5 mL.  Performed by: Personally  Anesthesiologist: Lidia Collum, MD  Additional Notes: Monitors applied. Injection made in 5cc increments. No resistance to injection. Good needle visualization. Patient tolerated procedure well.

## 2018-11-04 NOTE — Anesthesia Procedure Notes (Signed)
Procedure Name: Intubation Date/Time: 11/04/2018 11:26 AM Performed by: Scheryl Darter, CRNA Pre-anesthesia Checklist: Patient identified, Emergency Drugs available, Suction available and Patient being monitored Patient Re-evaluated:Patient Re-evaluated prior to induction Oxygen Delivery Method: Circle System Utilized Preoxygenation: Pre-oxygenation with 100% oxygen Induction Type: IV induction Ventilation: Mask ventilation without difficulty Laryngoscope Size: Mac and 3 Grade View: Grade I Tube type: Oral Tube size: 7.0 mm Number of attempts: 1 Airway Equipment and Method: Stylet and Oral airway Placement Confirmation: ETT inserted through vocal cords under direct vision,  positive ETCO2 and breath sounds checked- equal and bilateral Secured at: 21 cm Tube secured with: Tape Dental Injury: Teeth and Oropharynx as per pre-operative assessment

## 2018-11-04 NOTE — Anesthesia Procedure Notes (Signed)
Arterial Line Insertion Start/End8/17/2020 10:25 AM, 11/04/2018 10:30 AM Performed by: Lidia Collum, MD, Gwyndolyn Saxon, CRNA, CRNA  Patient location: Pre-op. Preanesthetic checklist: patient identified, IV checked, site marked, risks and benefits discussed, surgical consent, monitors and equipment checked, pre-op evaluation, timeout performed and anesthesia consent Lidocaine 1% used for infiltration and patient sedated Left, radial was placed Catheter size: 20 G Hand hygiene performed  and maximum sterile barriers used  Allen's test indicative of satisfactory collateral circulation Attempts: 1 Procedure performed without using ultrasound guided technique. Following insertion, dressing applied and Biopatch. Post procedure assessment: unchanged  Patient tolerated the procedure well with no immediate complications.

## 2018-11-05 ENCOUNTER — Encounter (HOSPITAL_COMMUNITY): Payer: Self-pay | Admitting: General Practice

## 2018-11-05 DIAGNOSIS — Z96611 Presence of right artificial shoulder joint: Secondary | ICD-10-CM

## 2018-11-05 DIAGNOSIS — Z419 Encounter for procedure for purposes other than remedying health state, unspecified: Secondary | ICD-10-CM

## 2018-11-05 LAB — PREPARE RBC (CROSSMATCH)

## 2018-11-05 LAB — SODIUM, URINE, RANDOM: Sodium, Ur: 10 mmol/L

## 2018-11-05 LAB — GLUCOSE, CAPILLARY
Glucose-Capillary: 236 mg/dL — ABNORMAL HIGH (ref 70–99)
Glucose-Capillary: 195 mg/dL — ABNORMAL HIGH (ref 70–99)
Glucose-Capillary: 154 mg/dL — ABNORMAL HIGH (ref 70–99)
Glucose-Capillary: 232 mg/dL — ABNORMAL HIGH (ref 70–99)

## 2018-11-05 LAB — BASIC METABOLIC PANEL
Anion gap: 9 (ref 5–15)
BUN: 46 mg/dL — ABNORMAL HIGH (ref 8–23)
CO2: 25 mmol/L (ref 22–32)
Calcium: 8.8 mg/dL — ABNORMAL LOW (ref 8.9–10.3)
Chloride: 100 mmol/L (ref 98–111)
Creatinine, Ser: 2.28 mg/dL — ABNORMAL HIGH (ref 0.44–1.00)
GFR calc Af Amer: 25 mL/min — ABNORMAL LOW (ref >60)
GFR calc non Af Amer: 22 mL/min — ABNORMAL LOW (ref >60)
Glucose, Bld: 243 mg/dL — ABNORMAL HIGH (ref 70–99)
Potassium: 5 mmol/L (ref 3.5–5.1)
Sodium: 134 mmol/L — ABNORMAL LOW (ref 135–145)

## 2018-11-05 LAB — CBC
HCT: 23.4 % — ABNORMAL LOW (ref 36.0–46.0)
Hemoglobin: 7.7 g/dL — ABNORMAL LOW (ref 12.0–15.0)
MCH: 30.3 pg (ref 26.0–34.0)
MCHC: 32.9 g/dL (ref 30.0–36.0)
MCV: 92.1 fL (ref 80.0–100.0)
Platelets: 178 10*3/uL (ref 150–400)
RBC: 2.54 MIL/uL — ABNORMAL LOW (ref 3.87–5.11)
RDW: 13.4 % (ref 11.5–15.5)
WBC: 9.6 10*3/uL (ref 4.0–10.5)
nRBC: 0 % (ref 0.0–0.2)

## 2018-11-05 LAB — TSH: TSH: 1.848 u[IU]/mL (ref 0.350–4.500)

## 2018-11-05 LAB — ABO/RH: ABO/RH(D): O POS

## 2018-11-05 LAB — CREATININE, URINE, RANDOM: Creatinine, Urine: 212.25 mg/dL

## 2018-11-05 LAB — OSMOLALITY, URINE: Osmolality, Ur: 381 mOsm/kg (ref 300–900)

## 2018-11-05 MED ORDER — SODIUM CHLORIDE 0.9% IV SOLUTION
Freq: Once | INTRAVENOUS | Status: AC
  Administered 2018-11-05: 18:00:00 via INTRAVENOUS

## 2018-11-05 MED ORDER — WHITE PETROLATUM EX OINT
TOPICAL_OINTMENT | CUTANEOUS | Status: AC
  Administered 2018-11-05: 0.2
  Filled 2018-11-05: qty 28.35

## 2018-11-05 NOTE — Evaluation (Signed)
Occupational Therapy Evaluation Patient Details Name: Sabrina Deleon MRN: [id number] DOB: [birth year] Today's Date: 11/05/2018    History of Present Illness Pt is a 68 y.o. female admitted 11/04/18 after falling at work (10/31/18) with right proximal humerus fracture; now s/p R reverse TSA complicated by an intra-operative humeral shaft fx. PMH includesHTN, DM2, HTN, gout, CKD, bilateral carpal tunnel release.   Clinical Impression   Pt is NWB of R UE s/p reverse TSA from a fall at home. PTA, pt lived at home with her husband and was independent with ADLs/selfcare, home mgt, driving and was working. Pt currently with sling for R UE and requires mod A for most of her ADLs/selfcare. Pt and her husband educated on sling/sholder protocol with handout provided, positioning, compensatory ADL techniques and ROM of digits and wrist.Pt able to ambulate to bathroom and perform functional transfers with Mod I. Pt's husband will be able to assist her 24/7. All education completed and no further acute OT is indicated at this time. Pt to d/c home this afternoon    Follow Up Recommendations  Follow surgeon's recommendation for DC plan and follow-up therapies;Home health OT    Equipment Recommendations  3 in 1 bedside commode;Other (comment)(reacher, LH bath sponge)    Recommendations for Other Services       Precautions / Restrictions Precautions Precautions: Shoulder Shoulder Interventions: Shoulder sling/immobilizer;At all times;Off for dressing/bathing/exercises Precaution Booklet Issued: Yes (comment) Precaution Comments: reviewed precautions, sling/shoulder protocol with pt and her husband Required Braces or Orthoses: Sling Restrictions Weight Bearing Restrictions: Yes RUE Weight Bearing: Non weight bearing Other Position/Activity Restrictions: R UE NWB      Mobility Bed Mobility Overal bed mobility: Modified Independent             General bed mobility comments: HOB elevated, used  rail  Transfers Overall transfer level: Needs assistance Equipment used: 1 person hand held assist Transfers: Sit to/from [facility] Sit to Stand: Min assist Stand pivot transfers: Modified independent (Device/Increase time)       General transfer comment: min A for sit - stand from bed, toilet and recliner    Balance                                           ADL either performed or assessed with clinical judgement   ADL Overall ADL's : Needs assistance/impaired Eating/Feeding: Set up;Sitting   Grooming: Wash/dry hands;Wash/dry face;Supervision/safety;Standing   Upper Body Bathing: Moderate assistance;With caregiver independent assisting   Lower Body Bathing: Moderate assistance;With caregiver independent assisting   Upper Body Dressing : Moderate assistance;With caregiver independent assisting   Lower Body Dressing: Moderate assistance;With caregiver independent assisting   Toilet Transfer: Modified Independent;Ambulation Toilet Transfer Details (indicate cue type and reason): slow pace of movement Toileting- Clothing Manipulation and Hygiene: Minimal assistance   Tub/ Shower Transfer: Modified independent;Shower seat   Functional mobility during ADLs: Modified independent       Vision Baseline Vision/History: Wears glasses Patient Visual Report: No change from baseline       Perception     Praxis      Pertinent Vitals/Pain Pain Assessment: No/denies pain Pain Intervention(s): Monitored during session;Repositioned     Hand Dominance Right   Extremity/Trunk Assessment Upper Extremity Assessment Upper Extremity Assessment: Generalized weakness;RUE deficits/detail RUE Deficits / Details: sling, NWB RUE: Unable to fully assess due to immobilization  Cervical / Trunk Assessment Cervical / Trunk Assessment: Normal   Communication Communication Communication: HOH   Cognition Arousal/Alertness:  Awake/alert Behavior During Therapy: WFL for tasks assessed/performed Overall Cognitive Status: Within Functional Limits for tasks assessed                                     General Comments       Exercises Other Exercises Other Exercises: pt and her husband educated on positioning of R UE for edmea mgt Other Exercises: pt and her husband educated on ROM of R digits, wrist and elbow   Shoulder Instructions Shoulder Instructions Donning/doffing shirt without moving shoulder: Moderate assistance;Caregiver independent with task Method for sponge bathing under operated UE: Moderate assistance;Caregiver independent with task Correct positioning of sling/immobilizer: Moderate assistance;Caregiver independent with task ROM for elbow, wrist and digits of operated UE: Minimal assistance;Caregiver independent with task Sling wearing schedule (on at all times/off for ADL's): Supervision/safety;Caregiver independent with task Proper positioning of operated UE when showering: Supervision/safety;Caregiver independent with task Positioning of UE while sleeping: Supervision/safety;Caregiver independent with task    Home Living Family/patient expects to be discharged to:: Private residence Living Arrangements: Spouse/significant other Available Help at Discharge: Family Type of Home: Mobile home Home Access: Stairs to enter Technical brewer of Steps: 4 Entrance Stairs-Rails: [place]: One level     Bathroom Shower/Tub: Occupational psychologist: Handicapped height     Home Equipment: [address] - built in;Cane - single point;Cane - quad;Walker - standard          Prior Functioning/Environment Level of Independence: Independent                 OT Problem List: Impaired UE functional use;Decreased knowledge of use of DME or AE;Decreased coordination;Decreased activity tolerance;Decreased range of motion;Pain;Decreased strength;Increased  edema;Obesity      OT Treatment/Interventions:      OT Goals(Current goals can be found in the care plan section) Acute Rehab OT Goals Patient Stated Goal: go home OT Goal Formulation: With patient/family  OT Frequency:     Barriers to D/C:            Co-evaluation              AM-PAC OT "6 Clicks" Daily Activity     Outcome Measure Help from another person eating meals?: A Little Help from another person taking care of personal grooming?: A Little Help from another person toileting, which includes using toliet, bedpan, or urinal?: A Lot Help from another person bathing (including washing, rinsing, drying)?: A Lot Help from another person to put on and taking off regular upper body clothing?: A Lot Help from another person to put on and taking off regular lower body clothing?: A Lot 6 Click Score: 14   End of Session Equipment Utilized During Treatment: Gait belt;Other (comment)(R UE sling) Nurse Communication: Mobility status  Activity Tolerance: Patient tolerated treatment well Patient left: in chair;with call bell/phone within reach;with family/visitor present  OT Visit Diagnosis: Muscle weakness (generalized) (M62.81);History of falling (Z91.81)                Time: 5364-6803 OT Time Calculation (min): 68 min Charges:  OT General Charges $OT Visit: 1 Visit OT Evaluation $OT Eval Moderate Complexity: 1 Mod OT Treatments $Self Care/Home Management : 8-22 mins $Therapeutic Activity: 8-22 mins $Therapeutic Exercise: 8-22 mins    Britt Bottom  11/05/2018, 11:30 AM

## 2018-11-05 NOTE — Plan of Care (Signed)
  Problem: Pain Managment: Goal: General experience of comfort will improve Outcome: Progressing   Problem: Safety: Goal: Ability to remain free from injury will improve Outcome: Progressing   Problem: Skin Integrity: Goal: Risk for impaired skin integrity will decrease Outcome: Progressing   

## 2018-11-05 NOTE — Progress Notes (Signed)
   Ortho Hand Progress Note  Subjective: No acute events last night. Patient up at bedside this AM. States are is still numb from the nerve block   Objective: Vital signs in last 24 hours: Temp:  [97 F (36.1 C)-99 F (37.2 C)] 98.5 F (36.9 C) (08/18 0330) Pulse Rate:  [77-129] 77 (08/18 0330) Resp:  [13-20] 17 (08/18 0330) BP: (102-131)/(47-77) 121/47 (08/18 0330) SpO2:  [90 %-97 %] 95 % (08/18 0330) Arterial Line BP: (113-154)/(46-64) 122/46 (08/17 1615) Weight:  [115.2 kg] 115.2 kg (08/17 0904)  Intake/Output from previous day: 08/17 0701 - 08/18 0700 In: 1040 [I.V.:1000] Out: 305 [Drains:205; Blood:100] Intake/Output this shift: No intake/output data recorded.  Recent Labs    11/04/18 0954 11/05/18 0322  HGB 10.5* 7.7*   Recent Labs    11/04/18 0954 11/05/18 0322  WBC 8.8 9.6  RBC 3.46* 2.54*  HCT 32.3* 23.4*  PLT 198 178   Recent Labs    11/04/18 0954 11/05/18 0322  NA 134* 134*  K 4.2 5.0  CL 100 100  CO2 24 25  BUN 38* 46*  CREATININE 1.69* 2.28*  GLUCOSE 223* 243*  CALCIUM 9.6 8.8*   No results for input(s): LABPT, INR in the last 72 hours.  aaox3 nad resp nonlabored rrr RUE: Arm in sling. Drain with bloody output. Dressings clean and dry. No eccymosis. Decreased motor and sensation secondary to continued effects of pre-operative block. Fingers wwp with bcr  Assessment/Plan: 68 yo F with R 4 part proximal humerus fracture s/p R RSA for Fx complicated by an intra-operative humeral shaft fracture  - Arm in sling/immoblizer at all times. NWB RUE - Continue drain. Will pull prior to discharge. Drain is not sutured in place - Post op abx - PO plus IV pain meds  - Regular diet - Hospitalist following. Appreciate medical co-management - PT/OT  OK for d/c later today if cleared by hospitalist/PT/OT   Avanell Shackleton III 11/05/2018, 7:32 AM  [phone]

## 2018-11-05 NOTE — Progress Notes (Signed)
Inpatient Diabetes Program Recommendations  AACE/ADA: New Consensus Statement on Inpatient Glycemic Control (2015)  Target Ranges:  Prepandial:   less than 140 mg/dL      Peak postprandial:   less than 180 mg/dL (1-2 hours)      Critically ill patients:  140 - 180 mg/dL   Lab Results  Component Value Date   GLUCAP 236 (H) 11/05/2018   HGBA1C 6.5 (H) 11/04/2018    Review of Glycemic Control Results for Sabrina Deleon, Sabrina Deleon (MRN [id number]) as of 11/05/2018 10:22  Ref. Range 11/04/2018 09:11 11/04/2018 15:33 11/04/2018 17:27 11/04/2018 20:41 11/05/2018 06:25  Glucose-Capillary Latest Ref Range: 70 - 99 mg/dL 229 (H) 271 (H) 276 (H) 337 (H) 236 (H)   Diabetes history: DM2 Outpatient Diabetes medications: Glipizide 2.5 mg bid Current orders for Inpatient glycemic control: Novolog sensitive correction tid + hs  Inpatient Diabetes Program Recommendations:   Increase Novolog correction to moderate tid + hs 0-5 units Noted patient may discharge home later today.  Thank you, Nani Gasser. Anniebelle Devore, RN, MSN, CDE  Diabetes Coordinator Inpatient Glycemic Control Team Team Pager [phone] (8am-5pm) 11/05/2018 10:23 AM

## 2018-11-05 NOTE — Progress Notes (Signed)
MEWS response:  The patient has been sitting up on the bedside commode for an extended time to urinate.  The patient tolerated activity well.  The patient is alert and oriented x 4.  Skin warm and dry.  Assisted back to the bed.  Will recheck BP.

## 2018-11-05 NOTE — Progress Notes (Signed)
Patient's blood transfusion completed at 2110.  Placed order for post transfusion CBC for 2315.

## 2018-11-05 NOTE — Consult Note (Signed)
Medical Consultation   Sabrina Deleon  RCV:[id number]  DOB: 11-Apr-1950  DOA: 11/04/2018  PCP: Kathyrn Drown, MD   Requesting physician: creighton  Reason for consultation: Multiple medical issues   Assessment/plan AKI-superimposed on CKD 3-FeNA concerning for ATN-multifactorial ACE + thiazide + perioperative hypovolemia =-will transfuse 1 unit of blood as hemoglobin is down to 7.7 from 10, bladder scan shows no obstructive uropathy she is passing good urine--strict ins and outs Repeat labs a.m. expect will recover completely-on discharge would resume lisinopril 20, Lasix 40 only after 1 week at physician office in the interim see below  Anemia of expected blood loss-give 1 unit of blood as above-recheck labs a.m.  Diabetes mellitus-not on home meds?  Continue sliding scale-continue glipizide when discharge 2.5 mg twice daily  Hyperlipidemia, bipolar, vertigo, vitamin D deficiency, hypothyroidism are all stable-continue meds on discharge  Moderate to severe aortic stenosis-needs to follow with Dr. Cyndia Bent of [facility] who she saw in 02/20/2018 for?  Aortic valve surgery when they are ready  We will continue to follow peripherally-if however creatinine and blood count are stable in a.m. the patient can be discharged at the convenience of Dr. Jeannie Fend  History of Present Illness: 68 year old white female type 2 diabetes mellitus HTN hypothyroid morbid obesity chronic kidney disease stage II, severe aortic stenosis last echo EF 65 to 70%, CKD 3, bipolar [place] over her puppy [place] hit her face right upper extremity-underwent right reverse shoulder arthroplasty Postoperatively has done well but found to have acute kidney injury7   Past Medical History: Past Medical History:  Diagnosis Date   Anxiety    Aortic stenosis    Arthritis    Chronic kidney disease    Dr. Tami Ribas- noted increase kidney function levels- took pt off Metformin and started on Onglyza  for Blood sugar control-pt being followed for this.   GERD (gastroesophageal reflux disease)    Gout    Heart murmur    Hypertension    Hypothyroidism    Morbid obesity ([facility]) 03/29/2018   Patient has elevated BMI with diabetes hyperlipidemia and hypertension   Pancreatitis    Type 2 diabetes mellitus ([facility])    Wears glasses    Wears partial dentures    Upper    Past Surgical History: Past Surgical History:  Procedure Laterality Date   BACK SURGERY     Lumbar   CARPAL TUNNEL RELEASE  11/02/2011   Procedure: CARPAL TUNNEL RELEASE;  Surgeon: Tennis Must, MD;  Location: [facility];  Service: Orthopedics;  Laterality: Right;   CARPAL TUNNEL RELEASE Left 01/14/2015   Procedure: LEFT CARPAL TUNNEL RELEASE;  Surgeon: Leanora Cover, MD;  Location: [facility];  Service: Orthopedics;  Laterality: Left;   CHOLECYSTECTOMY     laparoscopic   COLONOSCOPY     COLONOSCOPY N/A 11/12/2015   Procedure: COLONOSCOPY;  Surgeon: Rogene Houston, MD;  [facility];  Service: Endoscopy;  Laterality: N/A;  9:30   ESOPHAGOGASTRODUODENOSCOPY N/A 11/12/2015   Procedure: ESOPHAGOGASTRODUODENOSCOPY (EGD);  Surgeon: Rogene Houston, MD;  [facility];  Service: Endoscopy;  Laterality: N/A;   REVERSE SHOULDER ARTHROPLASTY Right 11/04/2018   RIGHT/LEFT HEART CATH AND CORONARY ANGIOGRAPHY N/A 10/18/2017   Procedure: RIGHT/LEFT HEART CATH AND CORONARY ANGIOGRAPHY;  Surgeon: Burnell Blanks, MD;  Location: [facility];  Service: Cardiovascular;  Laterality: N/A;   TOTAL HIP ARTHROPLASTY  Left 03/22/2016   Procedure: LEFT TOTAL HIP ARTHROPLASTY ANTERIOR APPROACH;  Surgeon: Gaynelle Arabian, MD;  Location: [facility];  Service: Orthopedics;  Laterality: Left;     Allergies:  No Known Allergies   Social History:  reports that she quit smoking about 37 years ago. Her smoking use included cigarettes. She has never used smokeless tobacco. She  reports current alcohol use. She reports that she does not use drugs.   Family History: History reviewed. No pertinent family history.  Physical Exam: Vitals:   11/04/18 2029 11/05/18 0330 11/05/18 0809 11/05/18 0840  BP: (!) 102/52 (!) 121/47 (!) 84/43 (!) 85/46  Pulse: 91 77 70 74  Resp: 16 17 20    Temp: 98.2 F (36.8 C) 98.5 F (36.9 C) 97.9 F (36.6 C)   TempSrc: Oral Oral Oral   SpO2: 94% 95% 94%   Weight:      Height:       Awake coherent no distress recommending of right eye with extension downward along the face Extraocular movements intact Very hard of hearing Chest is clear no rales no rhonchi Holosystolic 4/6 murmur left upper sternal edge right upper sternal edge, no bruit Abdomen soft nontender Lower extremities trace edema Neurologically intact Right arm inflamed and swollen with bandage from acromion process all the way down halfway up her arm slightly swollen sensation grossly intact further testing deferred  Data reviewed:  I have personally reviewed following labs and imaging studies Labs:  CBC: Recent Labs  Lab 11/04/18 0954 11/05/18 0322  WBC 8.8 9.6  HGB 10.5* 7.7*  HCT 32.3* 23.4*  MCV 93.4 92.1  PLT 198 630    Basic Metabolic Panel: Recent Labs  Lab 11/04/18 0954 11/05/18 0322  NA 134* 134*  K 4.2 5.0  CL 100 100  CO2 24 25  GLUCOSE 223* 243*  BUN 38* 46*  CREATININE 1.69* 2.28*  CALCIUM 9.6 8.8*   GFR Estimated Creatinine Clearance: 31.9 mL/min (A) (by C-G formula based on SCr of 2.28 mg/dL (H)). Liver Function Tests: No results for input(s): AST, ALT, ALKPHOS, BILITOT, PROT, ALBUMIN in the last 168 hours. No results for input(s): LIPASE, AMYLASE in the last 168 hours. No results for input(s): AMMONIA in the last 168 hours. Coagulation profile No results for input(s): INR, PROTIME in the last 168 hours.  Cardiac Enzymes: No results for input(s): CKTOTAL, CKMB, CKMBINDEX, TROPONINI in the last 168 hours. BNP: Invalid  input(s): POCBNP CBG: Recent Labs  Lab 11/04/18 1533 11/04/18 1727 11/04/18 2041 11/05/18 0625 11/05/18 1226  GLUCAP 271* 276* 337* 236* 195*   D-Dimer No results for input(s): DDIMER in the last 72 hours. Hgb A1c Recent Labs    11/04/18 0954  HGBA1C 6.5*   Lipid Profile No results for input(s): CHOL, HDL, LDLCALC, TRIG, CHOLHDL, LDLDIRECT in the last 72 hours. Thyroid function studies Recent Labs    11/05/18 0322  TSH 1.848   Anemia work up No results for input(s): VITAMINB12, FOLATE, FERRITIN, TIBC, IRON, RETICCTPCT in the last 72 hours. Urinalysis    Component Value Date/Time   COLORURINE YELLOW 03/15/2016 0900   APPEARANCEUR CLEAR 03/15/2016 0900   LABSPEC 1.016 03/15/2016 0900   PHURINE 6.0 03/15/2016 0900   GLUCOSEU NEGATIVE 03/15/2016 0900   HGBUR NEGATIVE 03/15/2016 0900   BILIRUBINUR NEGATIVE 03/15/2016 0900   KETONESUR NEGATIVE 03/15/2016 0900   PROTEINUR NEGATIVE 03/15/2016 0900   NITRITE NEGATIVE 03/15/2016 0900   LEUKOCYTESUR TRACE (A) 03/15/2016 0900     Sepsis Labs Invalid input(s):  PROCALCITONIN,  WBC,  LACTICIDVEN Microbiology Recent Results (from the past 240 hour(s))  SARS CORONAVIRUS 2 Nasal Swab Aptima Multi Swab     Status: None   Collection Time: 11/02/18 11:32 AM   Specimen: Aptima Multi Swab; Nasal Swab  Result Value Ref Range Status   SARS Coronavirus 2 NEGATIVE NEGATIVE Final    Comment: (NOTE) SARS-CoV-2 target nucleic acids are NOT DETECTED. The SARS-CoV-2 RNA is generally detectable in upper and lower respiratory specimens during the acute phase of infection. Negative results do not preclude SARS-CoV-2 infection, do not rule out co-infections with other pathogens, and should not be used as the sole basis for treatment or other patient management decisions. Negative results must be combined with clinical observations, patient history, and epidemiological information. The expected result is Negative. Fact Sheet for  Patients: SugarRoll.be Fact Sheet for Healthcare Providers: https://www.woods-mathews.com/ This test is not yet approved or cleared by the [place] FDA and  has been authorized for detection and/or diagnosis of SARS-CoV-2 by FDA under an Emergency Use Authorization (EUA). This EUA will remain  in effect (meaning this test can be used) for the duration of the COVID-19 declaration under Section 56 4(b)(1) of the Act, 21 U.S.C. section 360bbb-3(b)(1), unless the authorization is terminated or revoked sooner. Performed at [facility], [place] [address] [place], [place] [address]        Inpatient Medications:   Scheduled Meds:  sodium chloride   Intravenous Once   citalopram  30 mg Oral Daily   docusate sodium  100 mg Oral BID   ferrous sulfate  325 mg Oral Q breakfast   insulin aspart  0-5 Units Subcutaneous QHS   insulin aspart  0-9 Units Subcutaneous TID WC   levothyroxine  137 mcg Oral QAC breakfast   pantoprazole  40 mg Oral Daily   pravastatin  40 mg Oral Daily   zolpidem  5 mg Oral QHS   Continuous Infusions:  lactated ringers 75 mL/hr at 11/05/18 0934   methocarbamol (ROBAXIN) IV       Radiological Exams on Admission: Dg Shoulder Right Port  Result Date: 11/04/2018 CLINICAL DATA:  Status post reversed total shoulder arthroplasty. EXAM: PORTABLE RIGHT SHOULDER COMPARISON:  10/31/2018 FINDINGS: The patient has undergone reverse total shoulder arthroplasty on the right with a long stem prosthesis. A surgical drain is noted. There are expected postsurgical changes including subcutaneous gas and overlying soft tissue edema. Multiple cerclage wires are noted. IMPRESSION: Status post total shoulder arthroplasty on the right. Electronically Signed   By: Constance Holster M.D.   On: 11/04/2018 15:50   Dg Humerus Right  Result Date: 11/04/2018 CLINICAL DATA:  Total shoulder arthroplasty EXAM: RIGHT HUMERUS - 2+ VIEW  COMPARISON:  10/31/2018 FINDINGS: The patient has undergone total shoulder arthroplasty on the right. The alignment appears near anatomic. A surgical drain is noted. There are expected postsurgical changes including subcutaneous gas and overlying soft tissue edema. There is no periprosthetic fracture. Multiple cerclage wires are noted. IMPRESSION: Status post total shoulder arthroplasty on the right. Electronically Signed   By: Constance Holster M.D.   On: 11/04/2018 15:51   Dg Humerus Right  Result Date: 11/04/2018 CLINICAL DATA:  ORIF of right humeral fracture EXAM: RIGHT HUMERUS - 2+ VIEW; DG C-ARM 61-120 MIN COMPARISON:  10/31/2018 FLUOROSCOPY TIME:  Fluoroscopy Time:  32 seconds Radiation Exposure Index (if provided by the fluoroscopic device): Not available Number of Acquired Spot Images: 4 FINDINGS: Right shoulder replacement is noted with cerclage  wire surrounding the proximal right humerus. IMPRESSION: ORIF of right humerus with shoulder replacement Electronically Signed   By: Inez Catalina M.D.   On: 11/04/2018 14:44   Dg C-arm 1-60 Min  Result Date: 11/04/2018 CLINICAL DATA:  ORIF of right humeral fracture EXAM: RIGHT HUMERUS - 2+ VIEW; DG C-ARM 61-120 MIN COMPARISON:  10/31/2018 FLUOROSCOPY TIME:  Fluoroscopy Time:  32 seconds Radiation Exposure Index (if provided by the fluoroscopic device): Not available Number of Acquired Spot Images: 4 FINDINGS: Right shoulder replacement is noted with cerclage wire surrounding the proximal right humerus. IMPRESSION: ORIF of right humerus with shoulder replacement Electronically Signed   By: Inez Catalina M.D.   On: 11/04/2018 14:44    Impression/Recommendations Active Problems:   Hypertension   Hypothyroidism   Type 2 diabetes mellitus ([facility])   Anxiety and depression   Severe aortic stenosis   Closed fracture of right proximal humerus   Time Spent: 35  Nita Sells M.D. [facility] 11/05/2018, 2:22 PM

## 2018-11-05 NOTE — Progress Notes (Signed)
PT Cancellation Note  Patient Details Name: MORANDA BILLIOT MRN: [id number] DOB: 03/24/1950   Cancelled Treatment:    Reason Eval/Treat Not Completed: PT screened, no needs identified, will sign off (after speaking with OT who evaluated patient). Please reconsult if new needs arise.  Mabeline Caras, PT, DPT Acute Rehabilitation Services  Pager [phone] Office [place] 11/05/2018, 11:20 AM

## 2018-11-05 NOTE — Anesthesia Postprocedure Evaluation (Signed)
Anesthesia Post Note  Patient: Sabrina Deleon  Procedure(s) Performed: REVERSE SHOULDER ARTHROPLASTY (Right Shoulder)     Patient location during evaluation: PACU Anesthesia Type: General Level of consciousness: awake and alert Pain management: pain level controlled Vital Signs Assessment: post-procedure vital signs reviewed and stable Respiratory status: spontaneous breathing, nonlabored ventilation and respiratory function stable Cardiovascular status: blood pressure returned to baseline and stable Postop Assessment: no apparent nausea or vomiting Anesthetic complications: no    Last Vitals:  Vitals:   11/05/18 0809 11/05/18 0840  BP: (!) 84/43 (!) 85/46  Pulse: 70 74  Resp: 20   Temp: 36.6 C   SpO2: 94%     Last Pain:  Vitals:   11/05/18 0809  TempSrc: Oral  PainSc:                  Lidia Collum

## 2018-11-05 NOTE — Progress Notes (Signed)
!  st unit PRBCS started and patient education was given.  Nurse is at the bedside for the 15 minutes after starting.

## 2018-11-05 NOTE — Progress Notes (Signed)
Orthopedic Tech Progress Note Patient Details:  Sabrina Deleon 11-25-1950 [id number]  Ortho Devices Type of Ortho Device: Sling immobilizer Ortho Device/Splint Location: xlg Ortho Device/Splint Interventions: Application   Post Interventions Patient Tolerated: Well Instructions Provided: Care of device   [name] 11/05/2018, 8:52 AM

## 2018-11-05 NOTE — Progress Notes (Signed)
Bladder scan 65mls

## 2018-11-06 DIAGNOSIS — N179 Acute kidney failure, unspecified: Secondary | ICD-10-CM

## 2018-11-06 DIAGNOSIS — E875 Hyperkalemia: Secondary | ICD-10-CM

## 2018-11-06 LAB — CBC
HCT: 25.5 % — ABNORMAL LOW (ref 36.0–46.0)
Hemoglobin: 8.6 g/dL — ABNORMAL LOW (ref 12.0–15.0)
MCH: 29.9 pg (ref 26.0–34.0)
MCHC: 33.7 g/dL (ref 30.0–36.0)
MCV: 88.5 fL (ref 80.0–100.0)
Platelets: 163 10*3/uL (ref 150–400)
RBC: 2.88 MIL/uL — ABNORMAL LOW (ref 3.87–5.11)
RDW: 14.1 % (ref 11.5–15.5)
WBC: 10.1 10*3/uL (ref 4.0–10.5)
nRBC: 0 % (ref 0.0–0.2)

## 2018-11-06 LAB — TYPE AND SCREEN
ABO/RH(D): O POS
Antibody Screen: NEGATIVE
Unit division: 0

## 2018-11-06 LAB — POTASSIUM
Potassium: 5.2 mmol/L — ABNORMAL HIGH (ref 3.5–5.1)
Potassium: 5.2 mmol/L — ABNORMAL HIGH (ref 3.5–5.1)

## 2018-11-06 LAB — BASIC METABOLIC PANEL
Anion gap: 11 (ref 5–15)
BUN: 51 mg/dL — ABNORMAL HIGH (ref 8–23)
CO2: 21 mmol/L — ABNORMAL LOW (ref 22–32)
Calcium: 8.9 mg/dL (ref 8.9–10.3)
Chloride: 104 mmol/L (ref 98–111)
Creatinine, Ser: 1.87 mg/dL — ABNORMAL HIGH (ref 0.44–1.00)
GFR calc Af Amer: 32 mL/min — ABNORMAL LOW (ref >60)
GFR calc non Af Amer: 27 mL/min — ABNORMAL LOW (ref >60)
Glucose, Bld: 222 mg/dL — ABNORMAL HIGH (ref 70–99)
Potassium: 5.2 mmol/L — ABNORMAL HIGH (ref 3.5–5.1)
Sodium: 136 mmol/L (ref 135–145)

## 2018-11-06 LAB — GLUCOSE, CAPILLARY
Glucose-Capillary: 217 mg/dL — ABNORMAL HIGH (ref 70–99)
Glucose-Capillary: 255 mg/dL — ABNORMAL HIGH (ref 70–99)
Glucose-Capillary: 205 mg/dL — ABNORMAL HIGH (ref 70–99)
Glucose-Capillary: 198 mg/dL — ABNORMAL HIGH (ref 70–99)

## 2018-11-06 LAB — BPAM RBC
Blood Product Expiration Date: 202009142359
ISSUE DATE / TIME: 202008181747
Unit Type and Rh: 5100

## 2018-11-06 MED ORDER — SODIUM POLYSTYRENE SULFONATE 15 GM/60ML PO SUSP
15.0000 g | Freq: Once | ORAL | Status: AC
  Administered 2018-11-06: 14:00:00 15 g via ORAL
  Filled 2018-11-06: qty 60

## 2018-11-06 MED ORDER — SODIUM POLYSTYRENE SULFONATE 15 GM/60ML PO SUSP
30.0000 g | Freq: Once | ORAL | Status: AC
  Administered 2018-11-06: 30 g via ORAL
  Filled 2018-11-06: qty 120

## 2018-11-06 MED ORDER — SODIUM ZIRCONIUM CYCLOSILICATE 10 G PO PACK: 10.0000 g | PACK | Freq: Once | ORAL | Status: DC

## 2018-11-06 MED ORDER — SODIUM ZIRCONIUM CYCLOSILICATE 10 G PO PACK
10.0000 g | PACK | ORAL | Status: AC
  Administered 2018-11-06: 10:00:00 10 g via ORAL
  Filled 2018-11-06: qty 1

## 2018-11-06 NOTE — Progress Notes (Signed)
Dr [name] text/paged in regards to 4 pm lab results and possible discharge.  Awaiting for return phone call.

## 2018-11-06 NOTE — Progress Notes (Addendum)
[facility] PROGRESS NOTE  CODI FOLKERTS WHQ:[id number] DOB: Apr 29, 1950 DOA: 11/04/2018  PCP: Kathyrn Drown, MD  Brief History/Interval Summary: 68 year old Caucasian female with past medical history of diabetes mellitus type 2, essential hypertension, hypothyroidism, morbid obesity, chronic kidney disease stage III, aortic stenosis, bipolar disorder presented after mechanical fall resulting in intraoperative humeral shaft fracture.  Patient was admitted by the orthopedic service.  Medicine service was consulted for management of her chronic medical issues.  Reason for Visit: Acute renal failure   Subjective/Interval History: Patient states that she does have pain in her right arm as well as in her face.  Overall she feels better.  Has been passing urine.   Objective:  Vital Signs  Vitals:   11/05/18 2029 11/05/18 2105 11/06/18 0320 11/06/18 0901  BP: (!) 125/104 (!) 128/58 (!) 122/54 (!) 127/56  Pulse: 80 75 73 64  Resp: 16 18 17 18   Temp: 97.6 F (36.4 C) 98.7 F (37.1 C) 97.8 F (36.6 C) 98.4 F (36.9 C)  TempSrc:   Oral Oral  SpO2: 96% 97% 93% 97%  Weight:      Height:        Intake/Output Summary (Last 24 hours) at 11/06/2018 1133 Last data filed at 11/06/2018 0650 Gross per 24 hour  Intake 360 ml  Output 1075 ml  Net -715 ml   Filed Weights   11/04/18 0904  Weight: 115.2 kg    General appearance: Awake alert.  In no distress Resp: Clear to auscultation bilaterally.  Normal effort Cardio: S1-S2 is normal regular.  No S3-S4.  No rubs murmurs or bruit GI: Abdomen is soft.  Nontender nondistended.  Bowel sounds are present normal.  No masses organomegaly Extremities: Right arm is in a sling. Neurologic: Alert and oriented x3.  No focal neurological deficits.    Lab Results:  Data Reviewed: I have personally reviewed following labs and imaging studies  CBC: Recent Labs  Lab 11/04/18 0954 11/05/18 0322 11/05/18 2359  WBC 8.8 9.6 10.1  HGB  10.5* 7.7* 8.6*  HCT 32.3* 23.4* 25.5*  MCV 93.4 92.1 88.5  PLT 198 178 659    Basic Metabolic Panel: Recent Labs  Lab 11/04/18 0954 11/05/18 0322 11/06/18 0750  NA 134* 134* 136  K 4.2 5.0 5.2*  CL 100 100 104  CO2 24 25 21*  GLUCOSE 223* 243* 222*  BUN 38* 46* 51*  CREATININE 1.69* 2.28* 1.87*  CALCIUM 9.6 8.8* 8.9    GFR: Estimated Creatinine Clearance: 38.9 mL/min (A) (by C-G formula based on SCr of 1.87 mg/dL (H)).   HbA1C: Recent Labs    11/04/18 0954  HGBA1C 6.5*    CBG: Recent Labs  Lab 11/05/18 1226 11/05/18 1616 11/05/18 2100 11/06/18 0625 11/06/18 1125  GLUCAP 195* 154* 232* 217* 255*     Thyroid Function Tests: Recent Labs    11/05/18 0322  TSH 1.848      Recent Results (from the past 240 hour(s))  SARS CORONAVIRUS 2 Nasal Swab Aptima Multi Swab     Status: None   Collection Time: 11/02/18 11:32 AM   Specimen: Aptima Multi Swab; Nasal Swab  Result Value Ref Range Status   SARS Coronavirus 2 NEGATIVE NEGATIVE Final    Comment: (NOTE) SARS-CoV-2 target nucleic acids are NOT DETECTED. The SARS-CoV-2 RNA is generally detectable in upper and lower respiratory specimens during the acute phase of infection. Negative results do not preclude SARS-CoV-2 infection, do not rule out co-infections with other pathogens, and  should not be used as the sole basis for treatment or other patient management decisions. Negative results must be combined with clinical observations, patient history, and epidemiological information. The expected result is Negative. Fact Sheet for Patients: SugarRoll.be Fact Sheet for Healthcare Providers: https://www.woods-mathews.com/ This test is not yet approved or cleared by the [place] FDA and  has been authorized for detection and/or diagnosis of SARS-CoV-2 by FDA under an Emergency Use Authorization (EUA). This EUA will remain  in effect (meaning this test can be used)  for the duration of the COVID-19 declaration under Section 56 4(b)(1) of the Act, 21 U.S.C. section 360bbb-3(b)(1), unless the authorization is terminated or revoked sooner. Performed at [facility], [place] [address], [place], [place] [address]       Radiology Studies: Dg Shoulder Right Port  Result Date: 11/04/2018 CLINICAL DATA:  Status post reversed total shoulder arthroplasty. EXAM: PORTABLE RIGHT SHOULDER COMPARISON:  10/31/2018 FINDINGS: The patient has undergone reverse total shoulder arthroplasty on the right with a long stem prosthesis. A surgical drain is noted. There are expected postsurgical changes including subcutaneous gas and overlying soft tissue edema. Multiple cerclage wires are noted. IMPRESSION: Status post total shoulder arthroplasty on the right. Electronically Signed   By: Constance Holster M.D.   On: 11/04/2018 15:50   Dg Humerus Right  Result Date: 11/04/2018 CLINICAL DATA:  Total shoulder arthroplasty EXAM: RIGHT HUMERUS - 2+ VIEW COMPARISON:  10/31/2018 FINDINGS: The patient has undergone total shoulder arthroplasty on the right. The alignment appears near anatomic. A surgical drain is noted. There are expected postsurgical changes including subcutaneous gas and overlying soft tissue edema. There is no periprosthetic fracture. Multiple cerclage wires are noted. IMPRESSION: Status post total shoulder arthroplasty on the right. Electronically Signed   By: Constance Holster M.D.   On: 11/04/2018 15:51   Dg Humerus Right  Result Date: 11/04/2018 CLINICAL DATA:  ORIF of right humeral fracture EXAM: RIGHT HUMERUS - 2+ VIEW; DG C-ARM 61-120 MIN COMPARISON:  10/31/2018 FLUOROSCOPY TIME:  Fluoroscopy Time:  32 seconds Radiation Exposure Index (if provided by the fluoroscopic device): Not available Number of Acquired Spot Images: 4 FINDINGS: Right shoulder replacement is noted with cerclage wire surrounding the proximal right humerus. IMPRESSION: ORIF of right humerus  with shoulder replacement Electronically Signed   By: Inez Catalina M.D.   On: 11/04/2018 14:44   Dg C-arm 1-60 Min  Result Date: 11/04/2018 CLINICAL DATA:  ORIF of right humeral fracture EXAM: RIGHT HUMERUS - 2+ VIEW; DG C-ARM 61-120 MIN COMPARISON:  10/31/2018 FLUOROSCOPY TIME:  Fluoroscopy Time:  32 seconds Radiation Exposure Index (if provided by the fluoroscopic device): Not available Number of Acquired Spot Images: 4 FINDINGS: Right shoulder replacement is noted with cerclage wire surrounding the proximal right humerus. IMPRESSION: ORIF of right humerus with shoulder replacement Electronically Signed   By: Inez Catalina M.D.   On: 11/04/2018 14:44     Medications:  Scheduled: . citalopram  30 mg Oral Daily  . docusate sodium  100 mg Oral BID  . ferrous sulfate  325 mg Oral Q breakfast  . insulin aspart  0-5 Units Subcutaneous QHS  . insulin aspart  0-9 Units Subcutaneous TID WC  . levothyroxine  137 mcg Oral QAC breakfast  . pantoprazole  40 mg Oral Daily  . pravastatin  40 mg Oral Daily  . zolpidem  5 mg Oral QHS   Continuous: . lactated ringers 75 mL/hr at 11/05/18 0934  . methocarbamol (ROBAXIN) IV  CLE:[id number], HYDROcodone-acetaminophen, HYDROcodone-acetaminophen, methocarbamol **OR** methocarbamol (ROBAXIN) IV, morphine injection, ondansetron **OR** ondansetron (ZOFRAN) IV, polyethylene glycol    Assessment/Plan:    Acute kidney injury superimposed on chronic kidney disease stage III Likely hemodynamically mediated due to ACE inhibitor and thiazides.  Patient was transfused 1 unit of blood yesterday.  She has been passing urine.  Creatinine has improved this morning.  Noted to have mild hyperkalemia for which she will be given Lokelma.  Recheck labs at noon.  If potassium level is normal then she can be discharged home today.  Will recommend holding her ACE inhibitor at discharge.  This can be addressed at follow-up with PCP.  Anemia due to operative blood loss  Transfuse 1 unit of blood yesterday.  Hemoglobin improved this morning.  Diabetes mellitus type 2 Glucose levels are noted to be elevated.  Noted to be on glipizide at home which can be resumed.  Moderate to severe aortic stenosis Outpatient follow-up with primary provider and cardiology.  Other chronic medical issues include hyperlipidemia, history of bipolar disorder, History of vertigo, hypothyroidism and vitamin D deficiency are all stable.  Continue home medications.   Morbid obesity Estimated body mass index is 38.62 kg/m as calculated from the following:   Height as of this encounter: 5\' 8"  (1.727 m).   Weight as of this encounter: 115.2 kg.   If potassium level at Noon is within normal limits patient can be discharged home today.    LOS: 2 days   [facility] Pager on [email or web address]  11/06/2018, 11:33 AM

## 2018-11-06 NOTE — Discharge Instructions (Signed)
Discharge Instructions  - Keep dressings in place for the next 5-7 days. You may shower over top of the current dressing. Once the surgical dressing has been removed, the incision may get wet as long as it is without drainage. Change dressing then daily with clean gauze and tape - Take all medication as prescribed. Transition to over the counter pain medication as your pain improves - Keep the shoulder elevated over the next 48-72 hours to help with pain and swelling - Ice the shoulder regularly to help with pain and swelling - Perform pendulum excercises to the shoulder 2-3 times a day. Be sure to gently move the elbow, wrist and fingers to prevent swelling and stiffness - Please call to schedule a follow up appointment with Dr. Jeannie Fend at [phone] for 10-14 days following surgery

## 2018-11-06 NOTE — Progress Notes (Signed)
   Ortho Hand Progress Note  Subjective: No acute events last night. Received blood transfusion yesterday. States pain in right shoulder and arm controlled   Objective: Vital signs in last 24 hours: Temp:  [97.6 F (36.4 C)-99.4 F (37.4 C)] 97.8 F (36.6 C) (08/19 0320) Pulse Rate:  [70-86] 73 (08/19 0320) Resp:  [16-20] 17 (08/19 0320) BP: (84-128)/(43-104) 122/54 (08/19 0320) SpO2:  [93 %-97 %] 93 % (08/19 0320)  Intake/Output from previous day: 08/18 0701 - 08/19 0700 In: 720 [P.O.:720] Out: 475 [Urine:400; Drains:75] Intake/Output this shift: Total I/O In: -  Out: 475 [Urine:400; Drains:75]  Recent Labs    11/04/18 0954 11/05/18 0322 11/05/18 2359  HGB 10.5* 7.7* 8.6*   Recent Labs    11/05/18 0322 11/05/18 2359  WBC 9.6 10.1  RBC 2.54* 2.88*  HCT 23.4* 25.5*  PLT 178 163   Recent Labs    11/04/18 0954 11/05/18 0322  NA 134* 134*  K 4.2 5.0  CL 100 100  CO2 24 25  BUN 38* 46*  CREATININE 1.69* 2.28*  GLUCOSE 223* 243*  CALCIUM 9.6 8.8*   No results for input(s): LABPT, INR in the last 72 hours.  aaox3 nad resp nonlabored rrr RUE: Arm in sling. Drain with serosang output. Dressings clean and dry. No eccymosis. Motor intact ain/pin/u. SILT m/u/r/a.   Assessment/Plan: 68 yo F with R 4 part proximal humerus fracture s/p R RSA for Fx complicated by an intra-operative humeral shaft fracture  - Arm in sling/immoblizer at all times. NWB RUE - Drain pulled. OK to reinforce/change dressing over drain site as needed - Post op abx completed - PO plus IV pain meds  - Regular diet - Hospitalist following. Appreciate medical co-management - PT/OT  OK for d/c if cleared by hospitalist/PT/OT   Avanell Shackleton III 11/06/2018, 5:59 AM  [phone]

## 2018-11-06 NOTE — Progress Notes (Signed)
The patient was made aware of not being discharged tomorrow.  New orders received.  Possible discharge tomorrow.

## 2018-11-07 ENCOUNTER — Other Ambulatory Visit: Payer: Self-pay | Admitting: Family Medicine

## 2018-11-07 LAB — BASIC METABOLIC PANEL
Anion gap: 10 (ref 5–15)
BUN: 38 mg/dL — ABNORMAL HIGH (ref 8–23)
CO2: 23 mmol/L (ref 22–32)
Calcium: 8.8 mg/dL — ABNORMAL LOW (ref 8.9–10.3)
Chloride: 105 mmol/L (ref 98–111)
Creatinine, Ser: 1.46 mg/dL — ABNORMAL HIGH (ref 0.44–1.00)
GFR calc Af Amer: 43 mL/min — ABNORMAL LOW (ref >60)
GFR calc non Af Amer: 37 mL/min — ABNORMAL LOW (ref >60)
Glucose, Bld: 189 mg/dL — ABNORMAL HIGH (ref 70–99)
Potassium: 4.4 mmol/L (ref 3.5–5.1)
Sodium: 138 mmol/L (ref 135–145)

## 2018-11-07 LAB — GLUCOSE, CAPILLARY
Glucose-Capillary: 194 mg/dL — ABNORMAL HIGH (ref 70–99)
Glucose-Capillary: 228 mg/dL — ABNORMAL HIGH (ref 70–99)

## 2018-11-07 MED ORDER — HYDROCODONE-ACETAMINOPHEN 5-325 MG PO TABS: 1.0000 | ORAL_TABLET | Freq: Four times a day (QID) | ORAL | 0 refills | Status: DC | PRN

## 2018-11-07 MED ORDER — DOCUSATE SODIUM 100 MG PO CAPS: 100.0000 mg | ORAL_CAPSULE | Freq: Two times a day (BID) | ORAL | 0 refills | Status: DC | PRN

## 2018-11-07 NOTE — Progress Notes (Addendum)
[facility] PROGRESS NOTE  Sabrina Deleon LEX:[id number] DOB: [birth year] DOA: 11/04/2018  PCP: Kathyrn Drown, MD  Brief History/Interval Summary: 69 year old Caucasian female with past medical history of diabetes mellitus type 2, essential hypertension, hypothyroidism, morbid obesity, chronic kidney disease stage III, aortic stenosis, bipolar disorder presented after mechanical fall resulting in intraoperative humeral shaft fracture.  Patient was admitted by the orthopedic service.  Medicine service was consulted for management of her chronic medical issues.  Reason for Visit: Acute renal failure   Subjective/Interval History: Patient noted to be sitting up in the chair.  Feels well.  Denies any shortness of breath.     Objective:  Vital Signs  Vitals:   11/06/18 1435 11/06/18 2200 11/07/18 0350 11/07/18 0925  BP: (!) 119/54 (!) 138/58 (!) 137/55 (!) 105/52  Pulse: 68 72 61 64  Resp: 18 18 16 16   Temp: 99.6 F (37.6 C) 98.5 F (36.9 C) 98.4 F (36.9 C) 98.2 F (36.8 C)  TempSrc: Oral Oral Oral Oral  SpO2: 96% 98% 96% 97%  Weight:      Height:        Intake/Output Summary (Last 24 hours) at 11/07/2018 0928 Last data filed at 11/06/2018 1700 Gross per 24 hour  Intake 480 ml  Output -  Net 480 ml   Filed Weights   11/04/18 0904  Weight: 115.2 kg    General appearance: Awake alert.  In no distress Significant bruising noted over the right side of her face Resp: Clear to auscultation bilaterally.  Normal effort Cardio: S1-S2 is normal regular.  No S3-S4.  No rubs murmurs or bruit GI: Abdomen is soft.  Nontender nondistended.  Bowel sounds are present normal.  No masses organomegaly Extremities: No edema.  Right arm is in sling Neurologic: Alert and oriented x3.  No focal neurological deficits.      Lab Results:  Data Reviewed: I have personally reviewed following labs and imaging studies  CBC: Recent Labs  Lab 11/04/18 0954 11/05/18 0322 11/05/18  2359  WBC 8.8 9.6 10.1  HGB 10.5* 7.7* 8.6*  HCT 32.3* 23.4* 25.5*  MCV 93.4 92.1 88.5  PLT 198 178 449    Basic Metabolic Panel: Recent Labs  Lab 11/04/18 0954 11/05/18 0322 11/06/18 0750 11/06/18 1205 11/06/18 1625 11/07/18 0531  NA 134* 134* 136  --   --  138  K 4.2 5.0 5.2* 5.2* 5.2* 4.4  CL 100 100 104  --   --  105  CO2 24 25 21*  --   --  23  GLUCOSE 223* 243* 222*  --   --  189*  BUN 38* 46* 51*  --   --  38*  CREATININE 1.69* 2.28* 1.87*  --   --  1.46*  CALCIUM 9.6 8.8* 8.9  --   --  8.8*    GFR: Estimated Creatinine Clearance: 49.8 mL/min (A) (by C-G formula based on SCr of 1.46 mg/dL (H)).   HbA1C: Recent Labs    11/04/18 0954  HGBA1C 6.5*    CBG: Recent Labs  Lab 11/06/18 0625 11/06/18 1125 11/06/18 1639 11/06/18 2148 11/07/18 0658  GLUCAP 217* 255* 205* 198* 194*     Thyroid Function Tests: Recent Labs    11/05/18 0322  TSH 1.848      Recent Results (from the past 240 hour(s))  SARS CORONAVIRUS 2 Nasal Swab Aptima Multi Swab     Status: None   Collection Time: 11/02/18 11:32 AM   Specimen: Aptima  Multi Swab; Nasal Swab  Result Value Ref Range Status   SARS Coronavirus 2 NEGATIVE NEGATIVE Final    Comment: (NOTE) SARS-CoV-2 target nucleic acids are NOT DETECTED. The SARS-CoV-2 RNA is generally detectable in upper and lower respiratory specimens during the acute phase of infection. Negative results do not preclude SARS-CoV-2 infection, do not rule out co-infections with other pathogens, and should not be used as the sole basis for treatment or other patient management decisions. Negative results must be combined with clinical observations, patient history, and epidemiological information. The expected result is Negative. Fact Sheet for Patients: SugarRoll.be Fact Sheet for Healthcare Providers: https://www.woods-mathews.com/ This test is not yet approved or cleared by the [place]  FDA and  has been authorized for detection and/or diagnosis of SARS-CoV-2 by FDA under an Emergency Use Authorization (EUA). This EUA will remain  in effect (meaning this test can be used) for the duration of the COVID-19 declaration under Section 56 4(b)(1) of the Act, 21 U.S.C. section 360bbb-3(b)(1), unless the authorization is terminated or revoked sooner. Performed at [facility], [place] [address], [place], [place] [address]       Radiology Studies: No results found.   Medications:  Scheduled: . citalopram  30 mg Oral Daily  . docusate sodium  100 mg Oral BID  . ferrous sulfate  325 mg Oral Q breakfast  . insulin aspart  0-5 Units Subcutaneous QHS  . insulin aspart  0-9 Units Subcutaneous TID WC  . levothyroxine  137 mcg Oral QAC breakfast  . pantoprazole  40 mg Oral Daily  . pravastatin  40 mg Oral Daily  . zolpidem  5 mg Oral QHS   Continuous: . methocarbamol (ROBAXIN) IV     WER:[id number], HYDROcodone-acetaminophen, HYDROcodone-acetaminophen, methocarbamol **OR** methocarbamol (ROBAXIN) IV, morphine injection, ondansetron **OR** ondansetron (ZOFRAN) IV, polyethylene glycol    Assessment/Plan:    Acute kidney injury superimposed on chronic kidney disease stage III Likely hemodynamically mediated due to ACE inhibitor and thiazides.  Patient was transfused 1 unit of blood on 8/18.  Patient has been passing urine.  Creatinine has improved to 1.46 today.  Patient's potassium was noted to be mildly elevated yesterday.  Patient was given doses of Lokelma and then Kayexalate.  Noted to be 4.4 this morning.  We will continue to hold her ACE inhibitor and furosemide at discharge.  Follow-up with PCP for blood work in 1 to 2 weeks.    Anemia due to operative blood loss Hemoglobin responded appropriately to blood transfusion.  No evidence for overt bleeding.   Diabetes mellitus type 2 Glucose levels are noted to be elevated.  She is on glipizide at home which can  be resumed at discharge.  HbA1c 6.5.  Moderate to severe aortic stenosis Outpatient follow-up with primary provider and cardiology.  Other chronic medical issues include hyperlipidemia, history of bipolar disorder, History of vertigo, hypothyroidism and vitamin D deficiency are all stable.  Continue home medications.   Morbid obesity Estimated body mass index is 38.62 kg/m as calculated from the following:   Height as of this encounter: 5\' 8"  (1.727 m).   Weight as of this encounter: 115.2 kg.   Okay for discharge home today from medical standpoint.   LOS: 3 days   [place] [employer] on [email or web address]  11/07/2018, 9:28 AM

## 2018-11-07 NOTE — Progress Notes (Signed)
Provided discharge education/instructions, all questions and concerns addressed, Pt not in distress, states they have bedside commode at home and a reacher. Discharged home with belongings accompanied by husband.

## 2018-11-07 NOTE — Plan of Care (Signed)

## 2018-11-07 NOTE — Progress Notes (Signed)
   Ortho Hand Progress Note  Subjective: No acute events last night. Pain well controlled.    Objective: Vital signs in last 24 hours: Temp:  [98.2 F (36.8 C)-99.6 F (37.6 C)] 98.2 F (36.8 C) (08/20 0925) Pulse Rate:  [61-72] 64 (08/20 0925) Resp:  [16-18] 16 (08/20 0925) BP: (105-138)/(52-58) 105/52 (08/20 0925) SpO2:  [96 %-98 %] 97 % (08/20 0925)  Intake/Output from previous day: 08/19 0701 - 08/20 0700 In: 720 [P.O.:720] Out: -  Intake/Output this shift: No intake/output data recorded.  Recent Labs    11/04/18 0954 11/05/18 0322 11/05/18 2359  HGB 10.5* 7.7* 8.6*   Recent Labs    11/05/18 0322 11/05/18 2359  WBC 9.6 10.1  RBC 2.54* 2.88*  HCT 23.4* 25.5*  PLT 178 163   Recent Labs    11/06/18 0750  11/06/18 1625 11/07/18 0531  NA 136  --   --  138  K 5.2*   < > 5.2* 4.4  CL 104  --   --  105  CO2 21*  --   --  23  BUN 51*  --   --  38*  CREATININE 1.87*  --   --  1.46*  GLUCOSE 222*  --   --  189*  CALCIUM 8.9  --   --  8.8*   < > = values in this interval not displayed.   No results for input(s): LABPT, INR in the last 72 hours.  aaox3 nad resp nonlabored rrr RUE: Arm in sling. Dressings clean and dry. No eccymosis. Motor intact ain/pin/u. SILT m/u/r/a.   Assessment/Plan: 68 yo F with R 4 part proximal humerus fracture s/p R RSA for Fx complicated by an intra-operative humeral shaft fracture  - Arm in sling/immoblizer at all times. NWB RUE - OK to reinforce/change dressing over drain site as needed - Post op abx completed - PO plus IV pain meds  - Regular diet - Hospitalist following. Appreciate medical co-management - PT/OT  Follow up next week   D/c home today   Avanell Shackleton III 11/07/2018, 9:36 AM  [phone]

## 2018-11-07 NOTE — Discharge Summary (Signed)
Patient ID: Sabrina Deleon MRN: [id number] DOB/AGE: 09-13-1950 68 y.o.  Admit date: 11/04/2018 Discharge date: 11/07/2018  Admission Diagnoses: right proximal humerus fracture Past Medical History:  Diagnosis Date  . Anxiety   . Aortic stenosis   . Arthritis   . Chronic kidney disease    Dr. Tami Ribas- noted increase kidney function levels- took pt off Metformin and started on Onglyza for Blood sugar control-pt being followed for this.  Marland Kitchen GERD (gastroesophageal reflux disease)   . Gout   . Heart murmur   . Hypertension   . Hypothyroidism   . Morbid obesity ([place]) 03/29/2018   Patient has elevated BMI with diabetes hyperlipidemia and hypertension  . Pancreatitis   . Type 2 diabetes mellitus ([place])   . Wears glasses   . Wears partial dentures    Upper    Discharge Diagnoses:  Active Problems:   Hypertension   Hypothyroidism   Type 2 diabetes mellitus ([facility])   Anxiety and depression   Severe aortic stenosis   Closed fracture of right proximal humerus   Surgeries: Procedure(s): REVERSE SHOULDER ARTHROPLASTY on 11/04/2018    Consultants: PT/OT. Hospitalist  Discharged Condition: Improved  Hospital Course: Sabrina Deleon is an 68 y.o. female who was admitted 11/04/2018 with a chief complaint of right shoulder, and found to have a diagnosis of right proximal humerus fracture.  They were brought to the operating room on 11/04/2018 and underwent a right shoulder reverse arthroplasty for fracture with an intra-operative humeral shaft fracture.  They were given perioperative antibiotics:  Anti-infectives (From admission, onward)   Start     Dose/Rate Route Frequency Ordered Stop   11/04/18 1930  ceFAZolin (ANCEF) IVPB 1 g/50 mL premix     1 g 100 mL/hr over 30 Minutes Intravenous Every 8 hours 11/04/18 1455 11/05/18 1333   11/04/18 1155  vancomycin (VANCOCIN) powder  Status:  Discontinued       As needed 11/04/18 1155 11/04/18 1522   11/04/18 1030  ceFAZolin (ANCEF) IVPB 2g/100 mL  premix     2 g 200 mL/hr over 30 Minutes Intravenous On call to O.R. 11/04/18 7939 11/04/18 1138    .  They were given sequential compression devices, early ambulation for DVT prophylaxis.  Recent vital signs:  Patient Vitals for the past 24 hrs:  BP Temp Temp src Pulse Resp SpO2  11/07/18 0925 (!) 105/52 98.2 F (36.8 C) Oral 64 16 97 %  11/07/18 0350 (!) 137/55 98.4 F (36.9 C) Oral 61 16 96 %  11/06/18 2200 (!) 138/58 98.5 F (36.9 C) Oral 72 18 98 %  11/06/18 1435 (!) 119/54 99.6 F (37.6 C) Oral 68 18 96 %  .  Recent laboratory studies: No results found.  Discharge Medications:   Allergies as of 11/07/2018   No Known Allergies     Medication List    STOP taking these medications   furosemide 40 MG tablet Commonly known as: LASIX   lisinopril 20 MG tablet Commonly known as: ZESTRIL   meclizine 25 MG tablet Commonly known as: ANTIVERT   ondansetron 8 MG tablet Commonly known as: Zofran   oxyCODONE-acetaminophen 5-325 MG tablet Commonly known as: Percocet     TAKE these medications   allopurinol 100 MG tablet Commonly known as: ZYLOPRIM TAKE 1 TABLET BY MOUTH TWICE A DAY   aspirin EC 81 MG tablet Take 81 mg by mouth daily.   citalopram 20 MG tablet Commonly known as: CELEXA TAKE 1 AND 1/2 TABLETS BY  MOUTH EVERY DAY   docusate sodium 100 MG capsule Commonly known as: COLACE Take 1 capsule (100 mg total) by mouth 2 (two) times daily as needed for mild constipation.   ferrous sulfate 325 (65 FE) MG tablet Take 325 mg by mouth daily with breakfast.   glipiZIDE 5 MG tablet Commonly known as: GLUCOTROL TAKE 1/2 TABLET BY MOUTH TWICE DAILY What changed:   how much to take  how to take this  when to take this  additional instructions   glucose blood test strip 1 each by Other route as needed for other. Use as instructed   HYDROcodone-acetaminophen 5-325 MG tablet Commonly known as: NORCO/VICODIN Take 1-2 tablets by mouth every 6 (six) hours  as needed for severe pain. What changed:   how much to take  when to take this   ketoconazole 2 % cream Commonly known as: NIZORAL Apply 1 application topically as needed for irritation.   levothyroxine 137 MCG tablet Commonly known as: SYNTHROID Take 1 tablet (137 mcg total) by mouth daily before breakfast.   pantoprazole 40 MG tablet Commonly known as: PROTONIX TAKE 1 TABLET BY MOUTH EVERY DAY   pravastatin 40 MG tablet Commonly known as: PRAVACHOL TAKE 1 TABLET BY MOUTH EVERY DAY   VITAMIN D PO Take by mouth daily.   zolpidem 5 MG tablet Commonly known as: AMBIEN Take one tablet by mouth each night at bedtime What changed: Another medication with the same name was removed. Continue taking this medication, and follow the directions you see here.       Diagnostic Studies: Dg Shoulder Right  Result Date: 10/31/2018 CLINICAL DATA:  Recent trip and fall with shoulder pain, initial encounter EXAM: RIGHT SHOULDER - 2+ VIEW COMPARISON:  None. FINDINGS: Degenerative changes of the acromioclavicular joint are seen. Irregularity in the humeral head is noted suspicious for comminuted fracture. This is incompletely evaluated on this limited exam. No definitive dislocation is seen. IMPRESSION: Changes highly suspicious for comminuted fracture of the humeral head. Cross-sectional imaging may be helpful for further evaluation. Electronically Signed   By: Inez Catalina M.D.   On: 10/31/2018 13:30   Ct Head Wo Contrast  Result Date: 10/31/2018 CLINICAL DATA:  Head trauma post fall. EXAM: CT HEAD WITHOUT CONTRAST CT MAXILLOFACIAL WITHOUT CONTRAST TECHNIQUE: Multidetector CT imaging of the head and maxillofacial structures were performed using the standard protocol without intravenous contrast. Multiplanar CT image reconstructions of the maxillofacial structures were also generated. COMPARISON:  None. FINDINGS: CT HEAD FINDINGS Brain: No evidence of acute infarction, hemorrhage, hydrocephalus,  extra-axial collection or mass lesion/mass effect. Vascular: Calcific atherosclerotic disease of the intra cavernous carotid arteries. Skull: Normal. Negative for fracture or focal lesion. Other: Large right frontal and preseptal periorbital scalp hematoma. CT MAXILLOFACIAL FINDINGS Osseous: Mildly comminuted displaced fracture of the inferior right orbital wall. Orbits: Tiny fracture fragment within the inferior right orbit with associated small hematoma and few gas bubbles. No evidence of orbital fat or extraocular muscle entrapment. The left orbit is normal. Sinuses: Hemorrhagic right maxillary sinus effusion. The remainder of the paranasal sinuses are normal. Soft tissues: Large right preseptal periorbital hematoma. IMPRESSION: 1. No acute intracranial abnormality. 2. Mildly comminuted displaced fracture of the inferior right orbital wall. 3. Tiny fracture fragment within the inferior right orbit with associated small hematoma and few gas bubbles. No evidence of orbital fat or extraocular muscle entrapment. Associated hemorrhagic right maxillary sinus effusion. 4. Large right frontal scalp and preseptal periorbital hematoma. Electronically Signed   By:  Fidela Salisbury M.D.   On: 10/31/2018 13:59   Ct Shoulder Right Wo Contrast  Result Date: 10/31/2018 CLINICAL DATA:  Tripping injury falling onto right arm, right arm pain. EXAM: CT OF THE UPPER RIGHT EXTREMITY WITHOUT CONTRAST TECHNIQUE: Multidetector CT imaging of the upper right extremity was performed according to the standard protocol. COMPARISON:  None. FINDINGS: Bones/Joint/Cartilage Unusual fracture dislocation involving the right glenohumeral joint. The humerus is inferiorly dislocated with respect to the glenoid, with associated impaction fracture of the anterior and inferior glenoid rim (bony Bankart fracture). There are fractures involving the humeral head, lesser tuberosity, and greater tuberosity, with some comminuted minor fragments  posteriorly as shown for example on image 52/5. In general, much of the articular surface of the humeral head is impacted and displaced by about 1.5 cm, and probably rotated such that superior components of the articular surface are further lateral. The greater tuberosity fragment appears displaced slightly greater than 1 cm; the lesser tuberosity fragment mostly travels with the greater tuberosity fragment. There is a component of oblique fracture extending into the proximal metaphysis laterally. Accordingly this is probably best described as a three-part fracture, but with unusual features including the impaction and rotation of the humeral head component, the overt dislocation, and the prominent bony Bankart component. Ligaments Suboptimally assessed by CT. Muscles and Tendons No gross abnormality identified. Soft tissues There is hematoma in the subacromial subdeltoid bursa. IMPRESSION: 1. Unusual fracture dislocation of the right glenohumeral joint. Based on displacement of fragments this is likely a 3-part fracture of the humerus, with unusual features such as rotation and impaction of the dominant articular component of the humeral head. There is also inferior dislocation of the humerus with a bony Bankart fracture of the inferior glenoid. Comminution of smaller fragments posteriorly. 2. Complex fluid compatible with hematoma in the subacromial subdeltoid bursa. Electronically Signed   By: Van Clines M.D.   On: 10/31/2018 16:36   Dg Shoulder Right Port  Result Date: 11/04/2018 CLINICAL DATA:  Status post reversed total shoulder arthroplasty. EXAM: PORTABLE RIGHT SHOULDER COMPARISON:  10/31/2018 FINDINGS: The patient has undergone reverse total shoulder arthroplasty on the right with a long stem prosthesis. A surgical drain is noted. There are expected postsurgical changes including subcutaneous gas and overlying soft tissue edema. Multiple cerclage wires are noted. IMPRESSION: Status post total  shoulder arthroplasty on the right. Electronically Signed   By: Constance Holster M.D.   On: 11/04/2018 15:50   Dg Humerus Right  Result Date: 11/04/2018 CLINICAL DATA:  Total shoulder arthroplasty EXAM: RIGHT HUMERUS - 2+ VIEW COMPARISON:  10/31/2018 FINDINGS: The patient has undergone total shoulder arthroplasty on the right. The alignment appears near anatomic. A surgical drain is noted. There are expected postsurgical changes including subcutaneous gas and overlying soft tissue edema. There is no periprosthetic fracture. Multiple cerclage wires are noted. IMPRESSION: Status post total shoulder arthroplasty on the right. Electronically Signed   By: Constance Holster M.D.   On: 11/04/2018 15:51   Dg Humerus Right  Result Date: 11/04/2018 CLINICAL DATA:  ORIF of right humeral fracture EXAM: RIGHT HUMERUS - 2+ VIEW; DG C-ARM 61-120 MIN COMPARISON:  10/31/2018 FLUOROSCOPY TIME:  Fluoroscopy Time:  32 seconds Radiation Exposure Index (if provided by the fluoroscopic device): Not available Number of Acquired Spot Images: 4 FINDINGS: Right shoulder replacement is noted with cerclage wire surrounding the proximal right humerus. IMPRESSION: ORIF of right humerus with shoulder replacement Electronically Signed   By: Linus Mako.D.  On: 11/04/2018 14:44   Dg C-arm 1-60 Min  Result Date: 11/04/2018 CLINICAL DATA:  ORIF of right humeral fracture EXAM: RIGHT HUMERUS - 2+ VIEW; DG C-ARM 61-120 MIN COMPARISON:  10/31/2018 FLUOROSCOPY TIME:  Fluoroscopy Time:  32 seconds Radiation Exposure Index (if provided by the fluoroscopic device): Not available Number of Acquired Spot Images: 4 FINDINGS: Right shoulder replacement is noted with cerclage wire surrounding the proximal right humerus. IMPRESSION: ORIF of right humerus with shoulder replacement Electronically Signed   By: Inez Catalina M.D.   On: 11/04/2018 14:44   Ct Maxillofacial Wo Contrast  Result Date: 10/31/2018 CLINICAL DATA:  Head trauma post fall.  EXAM: CT HEAD WITHOUT CONTRAST CT MAXILLOFACIAL WITHOUT CONTRAST TECHNIQUE: Multidetector CT imaging of the head and maxillofacial structures were performed using the standard protocol without intravenous contrast. Multiplanar CT image reconstructions of the maxillofacial structures were also generated. COMPARISON:  None. FINDINGS: CT HEAD FINDINGS Brain: No evidence of acute infarction, hemorrhage, hydrocephalus, extra-axial collection or mass lesion/mass effect. Vascular: Calcific atherosclerotic disease of the intra cavernous carotid arteries. Skull: Normal. Negative for fracture or focal lesion. Other: Large right frontal and preseptal periorbital scalp hematoma. CT MAXILLOFACIAL FINDINGS Osseous: Mildly comminuted displaced fracture of the inferior right orbital wall. Orbits: Tiny fracture fragment within the inferior right orbit with associated small hematoma and few gas bubbles. No evidence of orbital fat or extraocular muscle entrapment. The left orbit is normal. Sinuses: Hemorrhagic right maxillary sinus effusion. The remainder of the paranasal sinuses are normal. Soft tissues: Large right preseptal periorbital hematoma. IMPRESSION: 1. No acute intracranial abnormality. 2. Mildly comminuted displaced fracture of the inferior right orbital wall. 3. Tiny fracture fragment within the inferior right orbit with associated small hematoma and few gas bubbles. No evidence of orbital fat or extraocular muscle entrapment. Associated hemorrhagic right maxillary sinus effusion. 4. Large right frontal scalp and preseptal periorbital hematoma. Electronically Signed   By: Fidela Salisbury M.D.   On: 10/31/2018 13:59    On POD1 her hemoglobin decreased to 7.7 so she received 1U PRBC which improved and stabilized her Hgb. Her potasium was elevated as well which was treated and improved to normal by POD3. She was cleared for discharge from the medical standpoint as well as PT/OT> They benefited maximally from their  hospital stay and there were no complications.     Disposition: Discharge disposition: 01-Home or Self Care      Discharge Instructions    Call MD / Call 911   Complete by: As directed    If you experience chest pain or shortness of breath, CALL 911 and be transported to the hospital emergency room.  If you develope a fever above 101 F, pus (white drainage) or increased drainage or redness at the wound, or calf pain, call your surgeon's office.   Constipation Prevention   Complete by: As directed    Drink plenty of fluids.  Prune juice may be helpful.  You may use a stool softener, such as Colace (over the counter) 100 mg twice a day.  Use MiraLax (over the counter) for constipation as needed.   Diet - low sodium heart healthy   Complete by: As directed    Increase activity slowly as tolerated   Complete by: As directed    Lifting restrictions   Complete by: As directed    Remain in sling full time. Do not lift anything with the right arm     Follow-up Information    Avanell Shackleton III,  MD. Schedule an appointment as soon as possible for a visit in 10 day(s).   Why: Call for appointment Friday 8/28 Contact information: [address] [place] [address] [phone]            Signed: Verner Mould, MD  11/07/2018, 9:41 AM

## 2018-11-08 NOTE — Telephone Encounter (Signed)
Six mo worth 

## 2018-11-11 ENCOUNTER — Encounter (HOSPITAL_COMMUNITY): Payer: Self-pay | Admitting: Orthopaedic Surgery

## 2018-11-17 ENCOUNTER — Telehealth: Payer: Self-pay | Admitting: Family Medicine

## 2018-11-17 NOTE — Telephone Encounter (Signed)
Please connect with the patient She recently got discharged from complete shoulder surgery replacement Please set up for virtual visit for this week This will be a virtual hospital follow-up to see how she is doing and making sure that her medical conditions are stable  Thank you

## 2018-11-18 ENCOUNTER — Other Ambulatory Visit: Payer: Self-pay

## 2018-11-18 NOTE — Telephone Encounter (Signed)
Appt scheduled, pt aware  °

## 2018-11-18 NOTE — Telephone Encounter (Signed)
Left message to schedule appointment this week per Dr. Nicki Reaper

## 2018-11-19 ENCOUNTER — Ambulatory Visit: Payer: Medicare Other | Admitting: Family Medicine

## 2018-11-27 ENCOUNTER — Other Ambulatory Visit: Payer: Self-pay | Admitting: Family Medicine

## 2018-11-29 NOTE — Telephone Encounter (Signed)
Left message to return call 

## 2018-11-29 NOTE — Telephone Encounter (Signed)
A couple of questions before I refill her Rx: 1. Why did Dr. [name] DC her Lisinopril at discharge? I cannot find any notes. 2. Can she make it to the lab to recheck her TSH? I need to know if her new dose of med from June is the right one.  Thanks.

## 2018-12-02 NOTE — Telephone Encounter (Signed)
Pt was told to stop Lisinopril by ortho in the hospital. Pt did not stop taking Lisinopril, she continued to take prescribed dose. Pt would like to wait until October to have blood work done due to her good arm is pinned to her body and most time the lab is unable to get blood from right arm. Please advise. Thank you

## 2018-12-03 ENCOUNTER — Telehealth: Payer: Self-pay | Admitting: *Deleted

## 2018-12-03 ENCOUNTER — Other Ambulatory Visit: Payer: Self-pay | Admitting: Nurse Practitioner

## 2018-12-03 ENCOUNTER — Other Ambulatory Visit: Payer: Self-pay | Admitting: *Deleted

## 2018-12-03 ENCOUNTER — Telehealth: Payer: Self-pay | Admitting: Family Medicine

## 2018-12-03 DIAGNOSIS — Z79899 Other long term (current) drug therapy: Secondary | ICD-10-CM

## 2018-12-03 DIAGNOSIS — Z0000 Encounter for general adult medical examination without abnormal findings: Secondary | ICD-10-CM

## 2018-12-03 DIAGNOSIS — I10 Essential (primary) hypertension: Secondary | ICD-10-CM

## 2018-12-03 DIAGNOSIS — E785 Hyperlipidemia, unspecified: Secondary | ICD-10-CM

## 2018-12-03 MED ORDER — LEVOTHYROXINE SODIUM 137 MCG PO TABS: 137.0000 ug | ORAL_TABLET | Freq: Every day | ORAL | 0 refills | Status: DC

## 2018-12-03 MED ORDER — PRAVASTATIN SODIUM 40 MG PO TABS: 40.0000 mg | ORAL_TABLET | Freq: Every day | ORAL | 0 refills | Status: DC

## 2018-12-03 NOTE — Telephone Encounter (Signed)
Does pt need labs for her physical with carolyn.

## 2018-12-03 NOTE — Telephone Encounter (Signed)
Please advise. Thank you

## 2018-12-03 NOTE — Telephone Encounter (Signed)
Her creatinine and GFR were back to baseline with last labs. Everything was off during her hospitalization. Continue Lisinopril for now since it helps protect her kidneys with the diabetes. Will call in all meds. Thanks.

## 2018-12-03 NOTE — Telephone Encounter (Signed)
May have 90-day on each of these

## 2018-12-03 NOTE — Telephone Encounter (Signed)
Patient needing refills on  Lisinopril, Pravastatin and levothyroxine sent to [facility] on [address].  Has appt scheduled 12/27/18.

## 2018-12-04 NOTE — Telephone Encounter (Signed)
I recommend lipid, met 7 (Note patient recently had A1c) Reason for met 7 renal insufficiency, reason for lipid hyperlipidemia/diabetes

## 2018-12-04 NOTE — Telephone Encounter (Signed)
Refills sent to pharm. Pt notified.  

## 2018-12-04 NOTE — Telephone Encounter (Signed)
Order put in and pt notified

## 2018-12-06 ENCOUNTER — Telehealth: Payer: Self-pay | Admitting: *Deleted

## 2018-12-06 ENCOUNTER — Other Ambulatory Visit: Payer: Self-pay | Admitting: Nurse Practitioner

## 2018-12-06 ENCOUNTER — Other Ambulatory Visit: Payer: Self-pay | Admitting: *Deleted

## 2018-12-06 ENCOUNTER — Telehealth: Payer: Self-pay | Admitting: Family Medicine

## 2018-12-06 DIAGNOSIS — E785 Hyperlipidemia, unspecified: Secondary | ICD-10-CM

## 2018-12-06 DIAGNOSIS — I10 Essential (primary) hypertension: Secondary | ICD-10-CM

## 2018-12-06 MED ORDER — HYDROCODONE-ACETAMINOPHEN 5-325 MG PO TABS: ORAL_TABLET | ORAL | 0 refills | Status: DC

## 2018-12-06 NOTE — Telephone Encounter (Signed)
Needs refill on pain med to [facility] [address]

## 2018-12-06 NOTE — Telephone Encounter (Signed)
Refill on Hydrocodone  Has upcoming appt on 12-27-18  [facility] [address]

## 2018-12-06 NOTE — Telephone Encounter (Signed)
Her ortho MD has been writing her pain meds. Has she called their office?

## 2018-12-06 NOTE — Telephone Encounter (Signed)
Autumn discussed with you

## 2018-12-06 NOTE — Telephone Encounter (Signed)
Last visit 09/11/18

## 2018-12-06 NOTE — Telephone Encounter (Signed)
Done

## 2018-12-07 NOTE — Telephone Encounter (Signed)
Done

## 2018-12-12 ENCOUNTER — Telehealth: Payer: Self-pay | Admitting: Family Medicine

## 2018-12-12 NOTE — Telephone Encounter (Signed)
Patient requesting a refill on Ambien. Looks like on 11/08/18 #30 with 5 refills was sent to [facility].  Patient told to check with the pharmacy for refills and if problems call us back.

## 2018-12-25 ENCOUNTER — Telehealth (HOSPITAL_COMMUNITY): Payer: Self-pay | Admitting: Specialist

## 2018-12-25 ENCOUNTER — Encounter (HOSPITAL_COMMUNITY): Payer: Self-pay | Admitting: Specialist

## 2018-12-25 ENCOUNTER — Other Ambulatory Visit: Payer: Self-pay

## 2018-12-25 ENCOUNTER — Ambulatory Visit (HOSPITAL_COMMUNITY): Payer: Worker's Compensation | Attending: Orthopaedic Surgery | Admitting: Specialist

## 2018-12-25 DIAGNOSIS — M25611 Stiffness of right shoulder, not elsewhere classified: Secondary | ICD-10-CM | POA: Insufficient documentation

## 2018-12-25 DIAGNOSIS — R29898 Other symptoms and signs involving the musculoskeletal system: Secondary | ICD-10-CM | POA: Diagnosis present

## 2018-12-25 DIAGNOSIS — M25511 Pain in right shoulder: Secondary | ICD-10-CM | POA: Diagnosis present

## 2018-12-25 NOTE — Telephone Encounter (Signed)
OT eval and tx (12 visits) has been confrimed with adjustor Perrin Smack. NF 12/25/2018

## 2018-12-25 NOTE — Patient Instructions (Signed)
Complete each stretch for 1-3 minutes, 3-5 times per day.   SHOULDER: Flexion On Table   Place hands on towel placed on table, slide arms across the table, leaning forward as able.  ___ reps per set, ___ sets per day  Abduction (Passive)   With arm out to side, resting on towel placed on table with palm DOWN, keeping trunk away from table, lean to the side while pushing towel away from body.  Repeat ____ times. Do ____ sessions per day.  Copyright  VHI. All rights reserved.     Internal Rotation (Assistive)   Seated with elbow bent at right angle and held against side, slide arm on table surface in an inward arc keeping elbow anchored in place. Repeat ____ times. Do ____ sessions per day. Activity: Use this motion to brush crumbs off the table.  Copyright  VHI. All rights reserved.

## 2018-12-25 NOTE — Therapy (Signed)
[place], [name], [address] Phone: [phone]   Fax:  [phone]  Occupational Therapy Evaluation  Patient Details  Name: Sabrina Deleon MRN: [id number] Date of Birth: 03-Nov-1950 Referring Provider (OT): Dr. Jeannie Fend   Encounter Date: 12/25/2018  OT End of Session - 12/25/18 2141    Visit Number  1    Number of Visits  16    Date for OT Re-Evaluation  02/19/19   mini reassessment on 01/22/19   Authorization Type  workers compensation 12 visits authorized    Authorization - Visit Number  1    Authorization - Number of Visits  12    OT Start Time  1530    OT Stop Time  1630    OT Time Calculation (min)  60 min    Activity Tolerance  Patient tolerated treatment well    Behavior During Therapy  [facility] for tasks assessed/performed       Past Medical History:  Diagnosis Date  . Anxiety   . Aortic stenosis   . Arthritis   . Chronic kidney disease    Dr. Tami Ribas- noted increase kidney function levels- took pt off Metformin and started on Onglyza for Blood sugar control-pt being followed for this.  Marland Kitchen GERD (gastroesophageal reflux disease)   . Gout   . Heart murmur   . Hypertension   . Hypothyroidism   . Morbid obesity ([place]) 03/29/2018   Patient has elevated BMI with diabetes hyperlipidemia and hypertension  . Pancreatitis   . Type 2 diabetes mellitus ([place])   . Wears glasses   . Wears partial dentures    Upper    Past Surgical History:  Procedure Laterality Date  . BACK SURGERY     Lumbar  . CARPAL TUNNEL RELEASE  11/02/2011   Procedure: CARPAL TUNNEL RELEASE;  Surgeon: Tennis Must, MD;  Location: [place];  Service: Orthopedics;  Laterality: Right;  . CARPAL TUNNEL RELEASE Left 01/14/2015   Procedure: LEFT CARPAL TUNNEL RELEASE;  Surgeon: Leanora Cover, MD;  Location: [place];  Service: Orthopedics;  Laterality: Left;  . CHOLECYSTECTOMY     laparoscopic  .  COLONOSCOPY    . COLONOSCOPY N/A 11/12/2015   Procedure: COLONOSCOPY;  Surgeon: Rogene Houston, MD;  [facility];  Service: Endoscopy;  Laterality: N/A;  9:30  . ESOPHAGOGASTRODUODENOSCOPY N/A 11/12/2015   Procedure: ESOPHAGOGASTRODUODENOSCOPY (EGD);  Surgeon: Rogene Houston, MD;  [facility];  Service: Endoscopy;  Laterality: N/A;  . REVERSE SHOULDER ARTHROPLASTY Right 11/04/2018  . REVERSE SHOULDER ARTHROPLASTY Right 11/04/2018   Procedure: REVERSE SHOULDER ARTHROPLASTY;  Surgeon: Verner Mould, MD;  Location: [place];  Service: Orthopedics;  Laterality: Right;  . RIGHT/LEFT HEART CATH AND CORONARY ANGIOGRAPHY N/A 10/18/2017   Procedure: RIGHT/LEFT HEART CATH AND CORONARY ANGIOGRAPHY;  Surgeon: Burnell Blanks, MD;  Location: [place] [facility];  Service: Cardiovascular;  Laterality: N/A;  . TOTAL HIP ARTHROPLASTY Left 03/22/2016   Procedure: LEFT TOTAL HIP ARTHROPLASTY ANTERIOR APPROACH;  Surgeon: Gaynelle Arabian, MD;  Location: [facility];  Service: Orthopedics;  Laterality: Left;    There were no vitals filed for this visit.  Subjective Assessment - 12/25/18 2136    Subjective   S:  I want to be able to comb the back of my hair.    Pertinent History  Sabrina Deleon is a 68 year old female who fell and fractured her right proximal humerus  and her cheek bone, as well as dislocating her right shoulder on 10/31/18.  She consequently had to have a reverse right total shoulder replacement on 11/04/18.  She has had her right arm in a sling, and was dc from sling om 12/13/18.  She has been referred to occupational therapy for evaluation and treatment following Dr. Shelbie Ammons protocol.    Special Tests  FOTO:  53% independent    Patient Stated Goals  i want to be able to do things for myself with my right arm.        [facility] OT Assessment - 12/25/18 0001      Assessment   Medical Diagnosis  S/P Right Reverse Total Shoulder Replacement    Referring Provider (OT)  Dr.  Jeannie Fend    Onset Date/Surgical Date  10/31/18    Hand Dominance  Right    Next MD Visit  unknown    Prior Therapy  n/a      Precautions   Precautions  Shoulder    Type of Shoulder Precautions  p/aa/arom with limitations of 130 flexion and 40 external rotation, no strengthening      Restrictions   Weight Bearing Restrictions  No      Balance Screen   Has the patient fallen in the past 6 months  Yes    How many times?  1    Has the patient had a decrease in activity level because of a fear of falling?   Yes    Is the patient reluctant to leave their home because of a fear of falling?   No      Home  Environment   Family/patient expects to be discharged to:  Private residence    Living Arrangements  Spouse/significant other    Available Help at Discharge  Family    Type of [place]  Full time employment    Vocation Requirements  keyboarding, some heavy lifting    Leisure  enjoys playing piano, crafting, spending time with granddaughters, reading      ADL   ADL comments  unable to use her right arm as dominant.  difficulty donning and doffing shirts, pants, bra, combing her hair, eating, completing overhead reaching      Vision - History   Baseline Vision  No visual deficits      Cognition   Overall Cognitive Status  Within Functional Limits for tasks assessed      Observation/Other Assessments   Focus on Therapeutic Outcomes (FOTO)   53% independent      Sensation   Light Touch  Appears Intact      Coordination   Gross Motor Movements are Fluid and Coordinated  Yes    Fine Motor Movements are Fluid and Coordinated  Yes      ROM / Strength   AROM / PROM / Strength  AROM;PROM;Strength      Palpation   Palpation comment  max fascial restrictions in right upper arm and shoulder region      AROM   Overall AROM Comments  assessed in seated, external and internal rotation with arm adducted     AROM Assessment Site  Shoulder    Right/Left Shoulder  Right    Right Shoulder Flexion  74 Degrees    Right Shoulder ABduction  55 Degrees    Right Shoulder Internal Rotation  70 Degrees    Right  Shoulder External Rotation  35 Degrees      PROM   Overall PROM Comments  assessed in supine, external and internal rotation with shoulder adducted    PROM Assessment Site  Shoulder    Right/Left Shoulder  Right    Right Shoulder Flexion  85 Degrees    Right Shoulder ABduction  60 Degrees    Right Shoulder Internal Rotation  70 Degrees    Right Shoulder External Rotation  10 Degrees      Strength   Overall Strength Comments  not assessed due to recent surgery    Strength Assessment Site  Shoulder    Right/Left Shoulder  Right               OT Treatments/Exercises (OP) - 12/25/18 0001      Exercises   Exercises  Shoulder      Shoulder Exercises: Supine   Protraction  PROM;5 reps    External Rotation  PROM;5 reps    Internal Rotation  PROM;5 reps    Flexion  PROM;5 reps    ABduction  PROM;5 reps      Manual Therapy   Manual Therapy  Myofascial release    Manual therapy comments  manual therapy completed seperately from all other interventions    Myofascial Release  myofascial release and manual stretching to right upper arm, scapular, and trapezius to decrease fascial and scar restrictions and improve pain free mobility and strength.              OT Education - 12/25/18 2140    Education Details  educated patient on HEP for table slides for increased P/ROM of right shoulder    Person(s) Educated  Patient    Methods  Explanation;Demonstration;Handout;Verbal cues    Comprehension  Verbalized understanding;Returned demonstration       OT Short Term Goals - 12/25/18 2149      OT SHORT TERM GOAL #1   Title  Patient will be educated and independent with HEP for right arm mobility and strength.    Time  4    Period  Weeks    Status  New    Target Date  01/22/19       OT SHORT TERM GOAL #2   Title  Patient will improve right shoulder P/ROM to [facility] in order to don shirts and pants with increased independence.    Time  4    Period  Weeks    Status  New      OT SHORT TERM GOAL #3   Title  Patient will increase right shoulder strength to 3+/5 for increased ability to lift laundry baskets, her dogs, and items at work.    Time  4    Period  Weeks    Status  New      OT SHORT TERM GOAL #4   Title  Patient will decrease pain in right shoulder to 5/10 or less during completing ADL tasks.    Time  4    Period  Weeks    Status  New      OT SHORT TERM GOAL #5   Title  Patient will decrease fascial and scar restrictions to moderate in right upper arm for increased mobility and less pain.    Time  4    Period  Weeks    Status  New        OT Long Term Goals - 12/25/18 2152      OT LONG TERM GOAL #1  Title  Patient will return to prior level of independence and use of right arm as dominant with all B/IADLs, work, and leisure tasks.    Time  8    Period  Weeks    Status  New    Target Date  02/19/19      OT LONG TERM GOAL #2   Title  Patient will increase A/ROM to [facility] in right shoulder for improved ability to comb her hair, tuck shirts in, and reach into overhead cabinets.    Time  8    Period  Weeks    Status  New      OT LONG TERM GOAL #3   Title  Patient will increase right shoulder strength to 4/5 or better for improved ability to lift boxes of drinks at work.    Time  8    Period  Weeks    Status  New      OT LONG TERM GOAL #4   Title  Patient will decrease right shoulder pain to 2/10 or better for improved ability to complete daily tasks.    Time  8    Period  Weeks    Status  New      OT LONG TERM GOAL #5   Title  Patient will decrease fascial restrictions in right shoulder region to minimal for improved ability to complete daily tasks with less pain.    Time  8    Period  Weeks    Status  New            Plan -  12/25/18 2143    Clinical Impression Statement  A:  Patient is a 68 year old female with past medical history significant for DM, HTN, OA, with recent right proximal humerus fracture and dislocation, resulting in a reverse total shoulder replacement on 11/04/18.  Patient is right hand dominant and is unable to complete daily tasks such as donning shirts and bras, combing hair, brushing teeth, lifting household items items with her dominant right arm.  She is also unable to complete work tasks and leisure activities, such as playing the piano.    OT Occupational Profile and History  Problem Focused Assessment - Including review of records relating to presenting problem    Occupational performance deficits (Please refer to evaluation for details):  ADL's;IADL's;Rest and Sleep;Work;Leisure    Body Structure / Function / Physical Skills  ADL;Muscle spasms;UE functional use;Fascial restriction;Pain;Strength;ROM    Rehab Potential  Good    Clinical Decision Making  Limited treatment options, no task modification necessary    Comorbidities Affecting Occupational Performance:  May have comorbidities impacting occupational performance    Modification or Assistance to Complete Evaluation   No modification of tasks or assist necessary to complete eval    OT Frequency  2x / week    OT Duration  8 weeks    OT Treatment/Interventions  Self-care/ADL training;Electrical Stimulation;Therapeutic exercise;Patient/family education;Neuromuscular education;Moist Heat;Energy conservation;Therapeutic activities;Scar mobilization;Cryotherapy;Ultrasound;DME and/or AE instruction;Manual Therapy;Passive range of motion    Plan  P:  Skilled OT intervention to decrease pain, fascial and scar restrictions in right shoulder while increasing P/AA/AROM and strength to [facility] in order to return to use of her RUE as dominant with work, leisure, and daily tasks.  Next session:  manual therapy, P/ROM progressing to AA/ROM and A/ROM as tolerated.   Begin isometrics.    OT Home Exercise Plan  12/25/18:  table slides    Consulted and Agree with Plan of Care  Patient       Patient will benefit from skilled therapeutic intervention in order to improve the following deficits and impairments:   Body Structure / Function / Physical Skills: ADL, Muscle spasms, UE functional use, Fascial restriction, Pain, Strength, ROM       Visit Diagnosis: Acute pain of right shoulder  Stiffness of right shoulder, not elsewhere classified  Other symptoms and signs involving the musculoskeletal system    Problem List Patient Active Problem List   Diagnosis Date Noted  . Closed fracture of right proximal humerus 11/04/2018  . Primary osteoarthritis involving multiple joints 09/12/2018  . Morbid obesity ([place]) 03/29/2018  . Severe aortic stenosis   . Iron deficiency anemia 10/13/2017  . Anxiety and depression 04/28/2017  . Encounter for long-term opiate analgesic use 04/28/2017  . Psychophysiological insomnia 04/28/2017  . OA (osteoarthritis) of hip 03/22/2016  . Chronic right hip pain 10/29/2015  . Chronic kidney disease (CKD) stage G3b/A1, moderately decreased glomerular filtration rate (GFR) between 30-44 mL/min/1.73 square meter and albuminuria creatinine ratio less than 30 mg/g 06/03/2015  . Type 2 diabetes mellitus ([place]) 11/09/2014  . Diastolic dysfunction, left ventricle 05/16/2013  . Calcific aortic stenosis 05/16/2013  . Gout 05/12/2013  . Diabetic retinopathy ([place]) 12/17/2012  . Hypertension 06/10/2012  . Hyperlipidemia 06/10/2012  . Hypothyroidism 06/10/2012    Vangie Bicker, [place], OTR/L [phone]  12/25/2018, 10:09 PM  [place], [name], [address] Phone: [phone]   Fax:  [phone]  Name: Sabrina Deleon MRN: [id number] Date of Birth: Sep 03, 1950

## 2018-12-26 ENCOUNTER — Ambulatory Visit (HOSPITAL_COMMUNITY): Payer: Medicare Other

## 2018-12-26 DIAGNOSIS — I10 Essential (primary) hypertension: Secondary | ICD-10-CM | POA: Diagnosis not present

## 2018-12-26 DIAGNOSIS — E785 Hyperlipidemia, unspecified: Secondary | ICD-10-CM | POA: Diagnosis not present

## 2018-12-27 ENCOUNTER — Ambulatory Visit (INDEPENDENT_AMBULATORY_CARE_PROVIDER_SITE_OTHER): Payer: Medicare Other | Admitting: Nurse Practitioner

## 2018-12-27 ENCOUNTER — Encounter: Payer: Self-pay | Admitting: Nurse Practitioner

## 2018-12-27 ENCOUNTER — Other Ambulatory Visit: Payer: Self-pay

## 2018-12-27 VITALS — BP 134/88 | Temp 97.6°F | Ht 68.0 in | Wt 248.0 lb

## 2018-12-27 DIAGNOSIS — Z780 Asymptomatic menopausal state: Secondary | ICD-10-CM

## 2018-12-27 DIAGNOSIS — M1990 Unspecified osteoarthritis, unspecified site: Secondary | ICD-10-CM

## 2018-12-27 DIAGNOSIS — Z0000 Encounter for general adult medical examination without abnormal findings: Secondary | ICD-10-CM | POA: Diagnosis not present

## 2018-12-27 LAB — BASIC METABOLIC PANEL
BUN/Creatinine Ratio: 21 (ref 12–28)
BUN: 31 mg/dL — ABNORMAL HIGH (ref 8–27)
CO2: 24 mmol/L (ref 20–29)
Calcium: 9.7 mg/dL (ref 8.7–10.3)
Chloride: 103 mmol/L (ref 96–106)
Creatinine, Ser: 1.51 mg/dL — ABNORMAL HIGH (ref 0.57–1.00)
GFR calc Af Amer: 41 mL/min/{1.73_m2} — ABNORMAL LOW (ref >59)
GFR calc non Af Amer: 36 mL/min/{1.73_m2} — ABNORMAL LOW (ref >59)
Glucose: 93 mg/dL (ref 65–99)
Potassium: 4.9 mmol/L (ref 3.5–5.2)
Sodium: 139 mmol/L (ref 134–144)

## 2018-12-27 LAB — LIPID PANEL
Chol/HDL Ratio: 4.3 ratio (ref 0.0–4.4)
Cholesterol, Total: 145 mg/dL (ref 100–199)
HDL: 34 mg/dL — ABNORMAL LOW (ref >39)
LDL Chol Calc (NIH): 80 mg/dL (ref 0–99)
Triglycerides: 181 mg/dL — ABNORMAL HIGH (ref 0–149)
VLDL Cholesterol Cal: 31 mg/dL (ref 5–40)

## 2018-12-27 NOTE — Progress Notes (Signed)
   Subjective:    Patient ID: Sabrina Deleon, female    DOB: [birth year], 68 y.o.   MRN: [id number]  HPI AWV- Annual Wellness Visit  The patient was seen for their annual wellness visit. The patient's past medical history, surgical history, and family history were reviewed. Pertinent vaccines were reviewed ( tetanus, pneumonia, shingles, flu) The patient's medication list was reviewed and updated.  The height and weight were entered.  BMI recorded in electronic record elsewhere  Cognitive screening was completed. Outcome of Mini - Cog: pass   Falls /depression screening electronically recorded within record elsewhere  Current tobacco usage:none (All patients who use tobacco were given written and verbal information on quitting)  Recent listing of emergency department/hospitalizations over the past year were reviewed.  current specialist the patient sees on a regular basis: none   Medicare annual wellness visit patient questionnaire was reviewed.  A written screening schedule for the patient for the next 5-10 years was given. Appropriate discussion of followup regarding next visit was discussed.      Review of Systems     Objective:   Physical Exam        Assessment & Plan:

## 2018-12-27 NOTE — Progress Notes (Signed)
Subjective:    Patient ID: Sabrina Deleon, female    DOB: 12-Oct-1950, 68 y.o.   MRN: [id number]  HPI AWV- Annual Wellness Visit  Pt has no specific concerns. Pt recently had orthopedic surgery to repair a humerus fracture. Pt is working with PT on her shoulder. Pt trying to exercise more. Pt trying to eat healthy meals. Pt sleeps about 8 hours per day. Pt has regular dental exam. Pt is not sure if she has had an eye exam this year, however she will schedule one if she did not have one. Pt has received both pneumonia vaccines. Pt has not received Shingrix. Last colonoscopy was in 2017, pt having them every 10 years. Had a mammogram this year, results normal. Received flu vaccine at the hospital. Pt is in a monogamous relationship with husband. No concerns for STI  The patient was seen for their annual wellness visit. The patient's past medical history, surgical history, and family history were reviewed.  Pertinent vaccines were reviewed ( tetanus, pneumonia, shingles, flu) The patient's medication list was reviewed and updated.  The height and weight were entered.  BMI recorded in electronic record elsewhere  Cognitive screening was completed. Outcome of Mini - Cog: pass   Falls /depression screening electronically recorded within record elsewhere  Current tobacco usage:none (All patients who use tobacco were given written and verbal information on quitting)  Recent listing of emergency department/hospitalizations over the past year were reviewed. current specialist the patient sees on a regular basis: none   Medicare annual wellness visit patient questionnaire was reviewed. A written screening schedule for the patient for the next 5-10 years was given. Appropriate discussion of followup regarding next visit was discussed.  Drugs/Alcohol: Pt is a former smoker, quit over 20 years ago. Pt drinks 1 beer 3-4 times a week.   Depression screen [facility] 2/9 12/27/2018 12/21/2017 04/27/2017   Decreased Interest 0 0 2  Down, Depressed, Hopeless 0 0 2  PHQ - 2 Score 0 0 4  Altered sleeping - 1 3  Tired, decreased energy - 0 2  Change in appetite - 0 1  Feeling bad or failure about yourself  - 0 3  Trouble concentrating - 0 2  Moving slowly or fidgety/restless - 0 1  Suicidal thoughts - 0 0  PHQ-9 Score - 1 16  Difficult doing work/chores - Not difficult at all -  Some recent data might be hidden       Review of Systems  Constitutional: Negative for activity change, appetite change, chills and fever.  HENT: Negative for ear pain, sinus pain and sneezing.   Respiratory: Negative for cough, shortness of breath and wheezing.   Cardiovascular: Negative for chest pain, palpitations and leg swelling.  Gastrointestinal: Negative for abdominal pain, blood in stool, constipation, diarrhea and vomiting.  Genitourinary: Negative for difficulty urinating, dysuria, frequency, genital sores, pelvic pain, vaginal bleeding and vaginal discharge.  Musculoskeletal: Negative for back pain and myalgias.  Neurological: Negative for dizziness, syncope, weakness and light-headedness.  Psychiatric/Behavioral: Negative for confusion and sleep disturbance.       Objective:   Physical Exam Exam conducted with a chaperone present.  Constitutional:      Appearance: Normal appearance.  Neck:     Thyroid: No thyroid mass or thyromegaly.  Cardiovascular:     Rate and Rhythm: Normal rate and regular rhythm.     Heart sounds: Murmur present.     Comments: Holosystolic murmur heard loudest at the 2nd intercostal space/right  sternal border; soft, blowing sound  Pulmonary:     Effort: No respiratory distress.     Breath sounds: Normal breath sounds. No stridor. No wheezing or rhonchi.  Chest:     Chest wall: No mass, lacerations, swelling or tenderness.     Breasts:        Right: Normal. No swelling, bleeding, inverted nipple or mass.        Left: Normal. No swelling, bleeding, inverted nipple  or mass.     Comments: Few cherry angiomas present across chest wall, scattered seborrheic keratosis present across chest wall, multiple skin tags present along chest and abdominal wall Abdominal:     General: Abdomen is flat.     Palpations: There is no mass.     Tenderness: There is no abdominal tenderness. There is no rebound.     Hernia: No hernia is present.  Genitourinary:    Comments: Defers GYN/GU exam Musculoskeletal:     Right lower leg: No edema.     Left lower leg: No edema.  Skin:    General: Skin is warm and dry.  Neurological:     Mental Status: She is alert and oriented to person, place, and time.  Psychiatric:        Mood and Affect: Mood normal.        Behavior: Behavior normal.    Diabetic Foot Exam - Simple   Simple Foot Form Visual Inspection See comments: Yes Sensation Testing Intact to touch and monofilament testing bilaterally: Yes Pulse Check Posterior Tibialis and Dorsalis pulse intact bilaterally: Yes Comments No skin breakdown, pt has several bunions present on both feet, pt has hammer toes bilaterally     Results for orders placed or performed in visit on 12/06/18  Lipid panel  Result Value Ref Range   Cholesterol, Total 145 100 - 199 mg/dL   Triglycerides 181 (H) 0 - 149 mg/dL   HDL 34 (L) >39 mg/dL   VLDL Cholesterol Cal 31 5 - 40 mg/dL   LDL Chol Calc (NIH) 80 0 - 99 mg/dL   Chol/HDL Ratio 4.3 0.0 - 4.4 ratio  Basic metabolic panel  Result Value Ref Range   Glucose 93 65 - 99 mg/dL   BUN 31 (H) 8 - 27 mg/dL   Creatinine, Ser 1.51 (H) 0.57 - 1.00 mg/dL   GFR calc non Af Amer 36 (L) >59 mL/min/1.73   GFR calc Af Amer 41 (L) >59 mL/min/1.73   BUN/Creatinine Ratio 21 12 - 28   Sodium 139 134 - 144 mmol/L   Potassium 4.9 3.5 - 5.2 mmol/L   Chloride 103 96 - 106 mmol/L   CO2 24 20 - 29 mmol/L   Calcium 9.7 8.7 - 10.3 mg/dL       Assessment & Plan:   Problem List Items Addressed This Visit    None    Visit Diagnoses    Well  woman exam (no gynecological exam)    -  Primary     -Recommend continued lifestyle and dietary modifications. Pt receiving PT for shoulder, continue.  Labs reviewed with pt, all questions were answered. Educated and discussed omega-3 tablet to help lower triglyceride levels. Discuss the need for bone density exam, referral ordered. Advised pt to receive Shingrix vaccine at local pharmacy. Will follow-up on labs in 3 months.   Return in about 3 months (around 03/29/2019) for  recheck labs.

## 2018-12-28 ENCOUNTER — Other Ambulatory Visit: Payer: Self-pay | Admitting: Family Medicine

## 2018-12-30 ENCOUNTER — Ambulatory Visit (HOSPITAL_COMMUNITY): Payer: Medicare Other | Admitting: Physical Therapy

## 2018-12-30 NOTE — Telephone Encounter (Signed)
Tried to call no answer. Med is not on med list

## 2018-12-31 ENCOUNTER — Other Ambulatory Visit: Payer: Self-pay

## 2018-12-31 ENCOUNTER — Ambulatory Visit (HOSPITAL_COMMUNITY): Payer: Worker's Compensation | Attending: Family Medicine | Admitting: Occupational Therapy

## 2018-12-31 ENCOUNTER — Encounter (HOSPITAL_COMMUNITY): Payer: Self-pay | Admitting: Occupational Therapy

## 2018-12-31 DIAGNOSIS — R29898 Other symptoms and signs involving the musculoskeletal system: Secondary | ICD-10-CM | POA: Diagnosis not present

## 2018-12-31 DIAGNOSIS — M25611 Stiffness of right shoulder, not elsewhere classified: Secondary | ICD-10-CM | POA: Diagnosis not present

## 2018-12-31 DIAGNOSIS — M25511 Pain in right shoulder: Secondary | ICD-10-CM

## 2018-12-31 NOTE — Therapy (Signed)
[place], [name], [address] Phone: [phone]   Fax:  [phone]  Occupational Therapy Treatment  Patient Details  Name: Sabrina Deleon MRN: [id number] Date of Birth: 09-17-1950 Referring Provider (OT): Dr. Jeannie Fend   Encounter Date: 12/31/2018  OT End of Session - 12/31/18 1640    Visit Number  2    Number of Visits  16    Date for OT Re-Evaluation  02/19/19   mini reassessment on 01/22/19   Authorization Type  workers compensation 12 visits authorized    Authorization - Visit Number  2    Authorization - Number of Visits  12    OT Start Time  1601    OT Stop Time  1643    OT Time Calculation (min)  42 min    Activity Tolerance  Patient tolerated treatment well    Behavior During Therapy  [facility] for tasks assessed/performed       Past Medical History:  Diagnosis Date  . Anxiety   . Aortic stenosis   . Arthritis   . Chronic kidney disease    Dr. Tami Ribas- noted increase kidney function levels- took pt off Metformin and started on Onglyza for Blood sugar control-pt being followed for this.  Marland Kitchen GERD (gastroesophageal reflux disease)   . Gout   . Heart murmur   . Hypertension   . Hypothyroidism   . Morbid obesity ([place]) 03/29/2018   Patient has elevated BMI with diabetes hyperlipidemia and hypertension  . Pancreatitis   . Type 2 diabetes mellitus ([place])   . Wears glasses   . Wears partial dentures    Upper    Past Surgical History:  Procedure Laterality Date  . BACK SURGERY     Lumbar  . CARPAL TUNNEL RELEASE  11/02/2011   Procedure: CARPAL TUNNEL RELEASE;  Surgeon: Tennis Must, MD;  Location: [place];  Service: Orthopedics;  Laterality: Right;  . CARPAL TUNNEL RELEASE Left 01/14/2015   Procedure: LEFT CARPAL TUNNEL RELEASE;  Surgeon: Leanora Cover, MD;  Location: [place];  Service: Orthopedics;  Laterality: Left;  . CHOLECYSTECTOMY     laparoscopic  .  COLONOSCOPY    . COLONOSCOPY N/A 11/12/2015   Procedure: COLONOSCOPY;  Surgeon: Rogene Houston, MD;  [facility];  Service: Endoscopy;  Laterality: N/A;  9:30  . ESOPHAGOGASTRODUODENOSCOPY N/A 11/12/2015   Procedure: ESOPHAGOGASTRODUODENOSCOPY (EGD);  Surgeon: Rogene Houston, MD;  [facility];  Service: Endoscopy;  Laterality: N/A;  . REVERSE SHOULDER ARTHROPLASTY Right 11/04/2018  . REVERSE SHOULDER ARTHROPLASTY Right 11/04/2018   Procedure: REVERSE SHOULDER ARTHROPLASTY;  Surgeon: Verner Mould, MD;  Location: [place];  Service: Orthopedics;  Laterality: Right;  . RIGHT/LEFT HEART CATH AND CORONARY ANGIOGRAPHY N/A 10/18/2017   Procedure: RIGHT/LEFT HEART CATH AND CORONARY ANGIOGRAPHY;  Surgeon: Burnell Blanks, MD;  Location: [place] [facility];  Service: Cardiovascular;  Laterality: N/A;  . TOTAL HIP ARTHROPLASTY Left 03/22/2016   Procedure: LEFT TOTAL HIP ARTHROPLASTY ANTERIOR APPROACH;  Surgeon: Gaynelle Arabian, MD;  [facility];  Service: Orthopedics;  Laterality: Left;    There were no vitals filed for this visit.  Subjective Assessment - 12/31/18 1600    Subjective   S: I've been doing my exercises and it's a little sore.    Currently in Pain?  Yes    Pain Score  4     Pain Location  Shoulder  Pain Orientation  Right    Pain Descriptors / Indicators  Aching;Sore    Pain Type  Acute pain    Pain Radiating Towards  n/a    Pain Onset  In the past 7 days    Pain Frequency  Intermittent    Aggravating Factors   movement    Pain Relieving Factors  rest    Effect of Pain on Daily Activities  mod effect on ADLs    Multiple Pain Sites  No         [facility] OT Assessment - 12/31/18 1600      Assessment   Medical Diagnosis  S/P Right Reverse Total Shoulder Replacement      Precautions   Precautions  Shoulder    Type of Shoulder Precautions  p/aa/arom with limitations of 130 flexion and 40 external rotation, no strengthening                OT Treatments/Exercises (OP) - 12/31/18 1604      Exercises   Exercises  Shoulder      Shoulder Exercises: Supine   Protraction  PROM;10 reps    External Rotation  PROM;10 reps    Internal Rotation  PROM;10 reps    Flexion  PROM;10 reps    ABduction  PROM;10 reps      Shoulder Exercises: Seated   Elevation  AROM;10 reps    Extension  AROM;10 reps    Row  AROM;10 reps      Shoulder Exercises: Therapy Ball   Flexion  10 reps    ABduction  10 reps      Shoulder Exercises: Isometric Strengthening   Flexion  Supine;3X5"    Extension  Supine;3X5"    External Rotation  Supine;3X5"    Internal Rotation  Supine;3X5"    ABduction  Supine;3X5"    ADduction  Supine;3X5"      Manual Therapy   Manual Therapy  Myofascial release    Manual therapy comments  manual therapy completed seperately from all other interventions    Myofascial Release  myofascial release and manual stretching to right upper arm, scapular, and trapezius to decrease fascial and scar restrictions and improve pain free mobility and strength.                 OT Short Term Goals - 12/31/18 1648      OT SHORT TERM GOAL #1   Title  Patient will be educated and independent with HEP for right arm mobility and strength.    Time  4    Period  Weeks    Status  On-going    Target Date  01/22/19      OT SHORT TERM GOAL #2   Title  Patient will improve right shoulder P/ROM to [facility] in order to don shirts and pants with increased independence.    Time  4    Period  Weeks    Status  On-going      OT SHORT TERM GOAL #3   Title  Patient will increase right shoulder strength to 3+/5 for increased ability to lift laundry baskets, her dogs, and items at work.    Time  4    Period  Weeks    Status  On-going      OT SHORT TERM GOAL #4   Title  Patient will decrease pain in right shoulder to 5/10 or less during completing ADL tasks.    Time  4    Period  Weeks  Status  On-going      OT SHORT  TERM GOAL #5   Title  Patient will decrease fascial and scar restrictions to moderate in right upper arm for increased mobility and less pain.    Time  4    Period  Weeks    Status  On-going        OT Long Term Goals - 12/31/18 1648      OT LONG TERM GOAL #1   Title  Patient will return to prior level of independence and use of right arm as dominant with all B/IADLs, work, and leisure tasks.    Time  8    Period  Weeks    Status  On-going      OT LONG TERM GOAL #2   Title  Patient will increase A/ROM to [facility] in right shoulder for improved ability to comb her hair, tuck shirts in, and reach into overhead cabinets.    Time  8    Period  Weeks    Status  On-going      OT LONG TERM GOAL #3   Title  Patient will increase right shoulder strength to 4/5 or better for improved ability to lift boxes of drinks at work.    Time  8    Period  Weeks    Status  On-going      OT LONG TERM GOAL #4   Title  Patient will decrease right shoulder pain to 2/10 or better for improved ability to complete daily tasks.    Time  8    Period  Weeks    Status  On-going      OT LONG TERM GOAL #5   Title  Patient will decrease fascial restrictions in right shoulder region to minimal for improved ability to complete daily tasks with less pain.    Time  8    Period  Weeks    Status  On-going            Plan - 12/31/18 1640    Clinical Impression Statement  A: Pt reports difficulty with abduction during HEP, discussed ways to modify. Continued with manual therapy to address fascial restrictions limiting ROM. Initiated isometrics, scapular A/ROM, and therapy ball stretches today. Pt able to tolerate passive stretching 40-50% range, er at approximately 30% range. Verbal cuing for form and technique.    Body Structure / Function / Physical Skills  ADL;Muscle spasms;UE functional use;Fascial restriction;Pain;Strength;ROM    Plan  P: Continue with isometrics, work on improving P/ROM as pt is able to  tolerate       Patient will benefit from skilled therapeutic intervention in order to improve the following deficits and impairments:   Body Structure / Function / Physical Skills: ADL, Muscle spasms, UE functional use, Fascial restriction, Pain, Strength, ROM       Visit Diagnosis: Acute pain of right shoulder  Stiffness of right shoulder, not elsewhere classified  Other symptoms and signs involving the musculoskeletal system    Problem List Patient Active Problem List   Diagnosis Date Noted  . Closed fracture of right proximal humerus 11/04/2018  . Primary osteoarthritis involving multiple joints 09/12/2018  . Morbid obesity ([place]) 03/29/2018  . Severe aortic stenosis   . Iron deficiency anemia 10/13/2017  . Anxiety and depression 04/28/2017  . Encounter for long-term opiate analgesic use 04/28/2017  . Psychophysiological insomnia 04/28/2017  . OA (osteoarthritis) of hip 03/22/2016  . Chronic right hip pain 10/29/2015  . Chronic kidney  disease (CKD) stage G3b/A1, moderately decreased glomerular filtration rate (GFR) between 30-44 mL/min/1.73 square meter and albuminuria creatinine ratio less than 30 mg/g 06/03/2015  . Type 2 diabetes mellitus ([place]) 11/09/2014  . Diastolic dysfunction, left ventricle 05/16/2013  . Calcific aortic stenosis 05/16/2013  . Gout 05/12/2013  . Diabetic retinopathy ([place]) 12/17/2012  . Hypertension 06/10/2012  . Hyperlipidemia 06/10/2012  . Hypothyroidism 06/10/2012   Guadelupe Sabin, OTR/L  [phone] 12/31/2018, 4:49 PM  [place] [address] [place], [name], [address] Phone: [phone]   Fax:  [phone]  Name: Sabrina Deleon MRN: [id number] Date of Birth: 03/15/1951

## 2019-01-01 ENCOUNTER — Ambulatory Visit (HOSPITAL_COMMUNITY): Payer: Medicare Other | Admitting: Physical Therapy

## 2019-01-02 ENCOUNTER — Ambulatory Visit (HOSPITAL_COMMUNITY): Payer: Worker's Compensation | Admitting: Specialist

## 2019-01-02 ENCOUNTER — Encounter (HOSPITAL_COMMUNITY): Payer: Self-pay | Admitting: Specialist

## 2019-01-02 ENCOUNTER — Other Ambulatory Visit: Payer: Self-pay

## 2019-01-02 DIAGNOSIS — M25611 Stiffness of right shoulder, not elsewhere classified: Secondary | ICD-10-CM | POA: Diagnosis not present

## 2019-01-02 DIAGNOSIS — M25511 Pain in right shoulder: Secondary | ICD-10-CM

## 2019-01-02 DIAGNOSIS — R29898 Other symptoms and signs involving the musculoskeletal system: Secondary | ICD-10-CM | POA: Diagnosis not present

## 2019-01-02 NOTE — Therapy (Signed)
[place], [name], [address] Phone: [phone]   Fax:  [phone]  Occupational Therapy Treatment  Patient Details  Name: Sabrina Deleon MRN: [id number] Date of Birth: 12/05/1950 Referring Provider (OT): Dr. Jeannie Fend   Encounter Date: 01/02/2019  OT End of Session - 01/02/19 2210    Visit Number  3    Number of Visits  16    Date for OT Re-Evaluation  02/19/19   mini reassessment on 11/04   Authorization Type  workers compensation 12 visits authorized    Authorization - Visit Number  3    Authorization - Number of Visits  12    OT Start Time  7510    OT Stop Time  2585    OT Time Calculation (min)  38 min    Activity Tolerance  Patient tolerated treatment well    Behavior During Therapy  [facility] for tasks assessed/performed       Past Medical History:  Diagnosis Date  . Anxiety   . Aortic stenosis   . Arthritis   . Chronic kidney disease    Dr. Tami Ribas- noted increase kidney function levels- took pt off Metformin and started on Onglyza for Blood sugar control-pt being followed for this.  Marland Kitchen GERD (gastroesophageal reflux disease)   . Gout   . Heart murmur   . Hypertension   . Hypothyroidism   . Morbid obesity ([place]) 03/29/2018   Patient has elevated BMI with diabetes hyperlipidemia and hypertension  . Pancreatitis   . Type 2 diabetes mellitus ([place])   . Wears glasses   . Wears partial dentures    Upper    Past Surgical History:  Procedure Laterality Date  . BACK SURGERY     Lumbar  . CARPAL TUNNEL RELEASE  11/02/2011   Procedure: CARPAL TUNNEL RELEASE;  Surgeon: Tennis Must, MD;  Location: [place];  Service: Orthopedics;  Laterality: Right;  . CARPAL TUNNEL RELEASE Left 01/14/2015   Procedure: LEFT CARPAL TUNNEL RELEASE;  Surgeon: Leanora Cover, MD;  Location: [place];  Service: Orthopedics;  Laterality: Left;  . CHOLECYSTECTOMY     laparoscopic  .  COLONOSCOPY    . COLONOSCOPY N/A 11/12/2015   Procedure: COLONOSCOPY;  Surgeon: Rogene Houston, MD;  [facility];  Service: Endoscopy;  Laterality: N/A;  9:30  . ESOPHAGOGASTRODUODENOSCOPY N/A 11/12/2015   Procedure: ESOPHAGOGASTRODUODENOSCOPY (EGD);  Surgeon: Rogene Houston, MD;  [facility];  Service: Endoscopy;  Laterality: N/A;  . REVERSE SHOULDER ARTHROPLASTY Right 11/04/2018  . REVERSE SHOULDER ARTHROPLASTY Right 11/04/2018   Procedure: REVERSE SHOULDER ARTHROPLASTY;  Surgeon: Verner Mould, MD;  Location: [place];  Service: Orthopedics;  Laterality: Right;  . RIGHT/LEFT HEART CATH AND CORONARY ANGIOGRAPHY N/A 10/18/2017   Procedure: RIGHT/LEFT HEART CATH AND CORONARY ANGIOGRAPHY;  Surgeon: Burnell Blanks, MD;  Location: [place] [facility];  Service: Cardiovascular;  Laterality: N/A;  . TOTAL HIP ARTHROPLASTY Left 03/22/2016   Procedure: LEFT TOTAL HIP ARTHROPLASTY ANTERIOR APPROACH;  Surgeon: Gaynelle Arabian, MD;  [facility];  Service: Orthopedics;  Laterality: Left;    There were no vitals filed for this visit.  Subjective Assessment - 01/02/19 2209    Subjective   S:  I am sorer today during this (manual therapy)  it could be because I was here on Tuesday.    Currently in Pain?  Yes    Pain Score  5  Pain Location  Shoulder    Pain Orientation  Right    Pain Descriptors / Indicators  Aching         [facility] OT Assessment - 01/02/19 0001      Assessment   Medical Diagnosis  S/P Right Reverse Total Shoulder Replacement      Precautions   Precautions  Shoulder    Type of Shoulder Precautions  p/aa/arom with limitations of 130 flexion and 40 external rotation, no strengthening               OT Treatments/Exercises (OP) - 01/02/19 0001      Exercises   Exercises  Shoulder      Shoulder Exercises: Supine   Protraction  PROM;10 reps    External Rotation  PROM;10 reps    Internal Rotation  PROM;10 reps    Flexion  PROM;10  reps    ABduction  PROM;10 reps      Shoulder Exercises: Seated   Elevation  AROM;12 reps    Extension  AROM;12 reps    Retraction  AROM;12 reps    Other Seated Exercises  elbow a/rom flexion, extension, supinaiton, pronation, wrist flexion, extension 10 times       Shoulder Exercises: Therapy Ball   Flexion  15 reps    Flexion Limitations  moderate tactile and verbal cues for form and to keep ball centered for stretch versus veering to left    ABduction  15 reps      Shoulder Exercises: ROM/Strengthening   Thumb Tacks  1' low    Prot/Ret//Elev/Dep  1' with tactile cues for technique and form for improved scapular mobility      Shoulder Exercises: Isometric Strengthening   Flexion  Supine;5X5"    Extension  Supine;5X5"    External Rotation  Supine;5X5"    Internal Rotation  Supine;5X5"    ABduction  Supine;5X5"    ADduction  Supine;5X5"      Manual Therapy   Manual Therapy  Myofascial release    Manual therapy comments  manual therapy completed seperately from all other interventions    Myofascial Release  myofascial release and manual stretching to right upper arm, scapular, and trapezius to decrease fascial and scar restrictions and improve pain free mobility and strength.                 OT Short Term Goals - 12/31/18 1648      OT SHORT TERM GOAL #1   Title  Patient will be educated and independent with HEP for right arm mobility and strength.    Time  4    Period  Weeks    Status  On-going    Target Date  01/22/19      OT SHORT TERM GOAL #2   Title  Patient will improve right shoulder P/ROM to [facility] in order to don shirts and pants with increased independence.    Time  4    Period  Weeks    Status  On-going      OT SHORT TERM GOAL #3   Title  Patient will increase right shoulder strength to 3+/5 for increased ability to lift laundry baskets, her dogs, and items at work.    Time  4    Period  Weeks    Status  On-going      OT SHORT TERM GOAL #4   Title   Patient will decrease pain in right shoulder to 5/10 or less during completing ADL tasks.  Time  4    Period  Weeks    Status  On-going      OT SHORT TERM GOAL #5   Title  Patient will decrease fascial and scar restrictions to moderate in right upper arm for increased mobility and less pain.    Time  4    Period  Weeks    Status  On-going        OT Long Term Goals - 12/31/18 1648      OT LONG TERM GOAL #1   Title  Patient will return to prior level of independence and use of right arm as dominant with all B/IADLs, work, and leisure tasks.    Time  8    Period  Weeks    Status  On-going      OT LONG TERM GOAL #2   Title  Patient will increase A/ROM to [facility] in right shoulder for improved ability to comb her hair, tuck shirts in, and reach into overhead cabinets.    Time  8    Period  Weeks    Status  On-going      OT LONG TERM GOAL #3   Title  Patient will increase right shoulder strength to 4/5 or better for improved ability to lift boxes of drinks at work.    Time  8    Period  Weeks    Status  On-going      OT LONG TERM GOAL #4   Title  Patient will decrease right shoulder pain to 2/10 or better for improved ability to complete daily tasks.    Time  8    Period  Weeks    Status  On-going      OT LONG TERM GOAL #5   Title  Patient will decrease fascial restrictions in right shoulder region to minimal for improved ability to complete daily tasks with less pain.    Time  8    Period  Weeks    Status  On-going            Plan - 01/02/19 2211    Clinical Impression Statement  a:  patient reporting increased tenderness in scapular region during manual therapy this date.  Patient guarded with passive streching, however was able to relax minimally by end of session.  increased isometric contraction time and added scapular stablity exercises this date, requiring moderate cuing for technique.    Body Structure / Function / Physical Skills  ADL;Muscle spasms;UE  functional use;Fascial restriction;Pain;Strength;ROM    Plan  P:  continue to improve p/rom within protocol for improved independence with use of her right arm during functional activities.  increase fluidity with ball stretches.       Patient will benefit from skilled therapeutic intervention in order to improve the following deficits and impairments:   Body Structure / Function / Physical Skills: ADL, Muscle spasms, UE functional use, Fascial restriction, Pain, Strength, ROM       Visit Diagnosis: Acute pain of right shoulder  Stiffness of right shoulder, not elsewhere classified  Other symptoms and signs involving the musculoskeletal system    Problem List Patient Active Problem List   Diagnosis Date Noted  . Closed fracture of right proximal humerus 11/04/2018  . Primary osteoarthritis involving multiple joints 09/12/2018  . Morbid obesity ([place]) 03/29/2018  . Severe aortic stenosis   . Iron deficiency anemia 10/13/2017  . Anxiety and depression 04/28/2017  . Encounter for long-term opiate analgesic use 04/28/2017  . Psychophysiological  insomnia 04/28/2017  . OA (osteoarthritis) of hip 03/22/2016  . Chronic right hip pain 10/29/2015  . Chronic kidney disease (CKD) stage G3b/A1, moderately decreased glomerular filtration rate (GFR) between 30-44 mL/min/1.73 square meter and albuminuria creatinine ratio less than 30 mg/g 06/03/2015  . Type 2 diabetes mellitus ([place]) 11/09/2014  . Diastolic dysfunction, left ventricle 05/16/2013  . Calcific aortic stenosis 05/16/2013  . Gout 05/12/2013  . Diabetic retinopathy ([place]) 12/17/2012  . Hypertension 06/10/2012  . Hyperlipidemia 06/10/2012  . Hypothyroidism 06/10/2012    Vangie Bicker, [place], OTR/L [phone]  01/02/2019, 10:21 PM  [place] [address] [place], [name], [address] Phone: [phone]   Fax:  [phone]  Name: GEORGETTE HELMER MRN: [id number] Date of Birth:  [birth year]

## 2019-01-03 ENCOUNTER — Telehealth: Payer: Self-pay | Admitting: Family Medicine

## 2019-01-03 ENCOUNTER — Other Ambulatory Visit: Payer: Self-pay | Admitting: Family Medicine

## 2019-01-03 ENCOUNTER — Other Ambulatory Visit: Payer: Self-pay | Admitting: Nurse Practitioner

## 2019-01-03 MED ORDER — HYDROCODONE-ACETAMINOPHEN 5-325 MG PO TABS: ORAL_TABLET | ORAL | 0 refills | Status: DC

## 2019-01-03 MED ORDER — FUROSEMIDE 40 MG PO TABS: 40.0000 mg | ORAL_TABLET | Freq: Every day | ORAL | 0 refills | Status: DC

## 2019-01-03 NOTE — Telephone Encounter (Signed)
Left message to return call 

## 2019-01-03 NOTE — Telephone Encounter (Signed)
Lasix reordered. Sent in 3 monthly refills on pain med. Thanks.

## 2019-01-03 NOTE — Telephone Encounter (Signed)
Pt notified on voicemail  °

## 2019-01-03 NOTE — Telephone Encounter (Signed)
Mailbox is full and not able to leave a message. 

## 2019-01-03 NOTE — Telephone Encounter (Signed)
Await response from carolyn on this refill. Phone message sent back today

## 2019-01-03 NOTE — Telephone Encounter (Signed)
Pt needs Rx for furosemide (LASIX) 40 MG tablet and HYDROcodone-acetaminophen (NORCO/VICODIN) 5-325 MG tablet   Pt states when she was discharged from the hospital some of her meds got messed up & apparently furosemide was removed from her med list, states she's taken for years  Also states that she's in PT & needs the hydrocodone, states she thought it had refills and pharmacy states it does not   Please advise & call pt    [facility]/[place]

## 2019-01-03 NOTE — Telephone Encounter (Signed)
Seen 12/27/18 for wellness

## 2019-01-06 ENCOUNTER — Other Ambulatory Visit: Payer: Self-pay

## 2019-01-06 ENCOUNTER — Ambulatory Visit (HOSPITAL_COMMUNITY): Payer: Worker's Compensation

## 2019-01-06 DIAGNOSIS — M25611 Stiffness of right shoulder, not elsewhere classified: Secondary | ICD-10-CM | POA: Diagnosis not present

## 2019-01-06 DIAGNOSIS — M25511 Pain in right shoulder: Secondary | ICD-10-CM

## 2019-01-06 DIAGNOSIS — R29898 Other symptoms and signs involving the musculoskeletal system: Secondary | ICD-10-CM

## 2019-01-06 NOTE — Therapy (Signed)
[place], [name], [address] Phone: [phone]   Fax:  [phone]  Occupational Therapy Treatment  Patient Details  Name: Sabrina Deleon MRN: [id number] Date of Birth: Jun 26, 1950 Referring Provider (OT): Dr. Jeannie Fend   Encounter Date: 01/06/2019  OT End of Session - 01/06/19 1124    Visit Number  4    Number of Visits  16    Date for OT Re-Evaluation  02/19/19   mini reassessment on 11/04   Authorization Type  workers compensation 12 visits authorized    Authorization - Visit Number  4    Authorization - Number of Visits  12    OT Start Time  1030    OT Stop Time  1109    OT Time Calculation (min)  39 min    Activity Tolerance  Patient tolerated treatment well    Behavior During Therapy  [facility] for tasks assessed/performed       Past Medical History:  Diagnosis Date  . Anxiety   . Aortic stenosis   . Arthritis   . Chronic kidney disease    Dr. Tami Ribas- noted increase kidney function levels- took pt off Metformin and started on Onglyza for Blood sugar control-pt being followed for this.  Marland Kitchen GERD (gastroesophageal reflux disease)   . Gout   . Heart murmur   . Hypertension   . Hypothyroidism   . Morbid obesity ([place]) 03/29/2018   Patient has elevated BMI with diabetes hyperlipidemia and hypertension  . Pancreatitis   . Type 2 diabetes mellitus ([place])   . Wears glasses   . Wears partial dentures    Upper    Past Surgical History:  Procedure Laterality Date  . BACK SURGERY     Lumbar  . CARPAL TUNNEL RELEASE  11/02/2011   Procedure: CARPAL TUNNEL RELEASE;  Surgeon: Tennis Must, MD;  Location: [place];  Service: Orthopedics;  Laterality: Right;  . CARPAL TUNNEL RELEASE Left 01/14/2015   Procedure: LEFT CARPAL TUNNEL RELEASE;  Surgeon: Leanora Cover, MD;  Location: [place];  Service: Orthopedics;  Laterality: Left;  . CHOLECYSTECTOMY     laparoscopic  .  COLONOSCOPY    . COLONOSCOPY N/A 11/12/2015   Procedure: COLONOSCOPY;  Surgeon: Rogene Houston, MD;  [facility];  Service: Endoscopy;  Laterality: N/A;  9:30  . ESOPHAGOGASTRODUODENOSCOPY N/A 11/12/2015   Procedure: ESOPHAGOGASTRODUODENOSCOPY (EGD);  Surgeon: Rogene Houston, MD;  [facility];  Service: Endoscopy;  Laterality: N/A;  . REVERSE SHOULDER ARTHROPLASTY Right 11/04/2018  . REVERSE SHOULDER ARTHROPLASTY Right 11/04/2018   Procedure: REVERSE SHOULDER ARTHROPLASTY;  Surgeon: Verner Mould, MD;  Location: [place];  Service: Orthopedics;  Laterality: Right;  . RIGHT/LEFT HEART CATH AND CORONARY ANGIOGRAPHY N/A 10/18/2017   Procedure: RIGHT/LEFT HEART CATH AND CORONARY ANGIOGRAPHY;  Surgeon: Burnell Blanks, MD;  Location: [place] [facility];  Service: Cardiovascular;  Laterality: N/A;  . TOTAL HIP ARTHROPLASTY Left 03/22/2016   Procedure: LEFT TOTAL HIP ARTHROPLASTY ANTERIOR APPROACH;  Surgeon: Gaynelle Arabian, MD;  [facility];  Service: Orthopedics;  Laterality: Left;    There were no vitals filed for this visit.  Subjective Assessment - 01/06/19 1106    Subjective   S: I take half a pain pill before I comet to therapy.    Currently in Pain?  No/denies         [facility] OT Assessment - 01/06/19 1106  Assessment   Medical Diagnosis  S/P Right Reverse Total Shoulder Replacement      Precautions   Precautions  Shoulder    Type of Shoulder Precautions  P/AA/A/ROM with limitations of 130 flexion and 40 external rotation, no strengthening               OT Treatments/Exercises (OP) - 01/06/19 1059      Exercises   Exercises  Shoulder      Shoulder Exercises: Supine   Protraction  PROM;5 reps    Horizontal ABduction  PROM;5 reps    External Rotation  PROM;5 reps    Internal Rotation  PROM;5 reps    Flexion  PROM;5 reps    ABduction  PROM;5 reps    Other Supine Exercises  Serratus Anterior punch; 10X with therapist unweighting  arm      Shoulder Exercises: Therapy Ball   Flexion  15 reps    ABduction  15 reps      Shoulder Exercises: ROM/Strengthening   Thumb Tacks  1'      Shoulder Exercises: Isometric Strengthening   Flexion  Supine;Other (comment)   1x10"   Extension  Supine;Other (comment)   1x10"   External Rotation  Supine;Other (comment)   1x10"   Internal Rotation  Supine;Other (comment)   1x10"   ABduction  Supine;Other (comment)   1x10"   ADduction  Supine;Other (comment)   1x10"     Manual Therapy   Manual Therapy  Myofascial release;Muscle Energy Technique    Manual therapy comments  manual therapy completed seperately from all other interventions    Myofascial Release  myofascial release and manual stretching to right upper arm, scapular, and trapezius to decrease fascial and scar restrictions and improve pain free mobility and strength.      Muscle Energy Technique  Muscle energy technique completed to right UE relax tone and improve range of motion             OT Education - 01/06/19 1107    Education Details  Eduated patient to try using heat at home for approximately 10 minutes at a time to help relax her tight muscles    Person(s) Educated  Patient    Methods  Explanation    Comprehension  Verbalized understanding       OT Short Term Goals - 12/31/18 1648      OT SHORT TERM GOAL #1   Title  Patient will be educated and independent with HEP for right arm mobility and strength.    Time  4    Period  Weeks    Status  On-going    Target Date  01/22/19      OT SHORT TERM GOAL #2   Title  Patient will improve right shoulder P/ROM to [facility] in order to don shirts and pants with increased independence.    Time  4    Period  Weeks    Status  On-going      OT SHORT TERM GOAL #3   Title  Patient will increase right shoulder strength to 3+/5 for increased ability to lift laundry baskets, her dogs, and items at work.    Time  4    Period  Weeks    Status  On-going      OT  SHORT TERM GOAL #4   Title  Patient will decrease pain in right shoulder to 5/10 or less during completing ADL tasks.    Time  4    Period  Weeks    Status  On-going      OT SHORT TERM GOAL #5   Title  Patient will decrease fascial and scar restrictions to moderate in right upper arm for increased mobility and less pain.    Time  4    Period  Weeks    Status  On-going        OT Long Term Goals - 12/31/18 1648      OT LONG TERM GOAL #1   Title  Patient will return to prior level of independence and use of right arm as dominant with all B/IADLs, work, and leisure tasks.    Time  8    Period  Weeks    Status  On-going      OT LONG TERM GOAL #2   Title  Patient will increase A/ROM to [facility] in right shoulder for improved ability to comb her hair, tuck shirts in, and reach into overhead cabinets.    Time  8    Period  Weeks    Status  On-going      OT LONG TERM GOAL #3   Title  Patient will increase right shoulder strength to 4/5 or better for improved ability to lift boxes of drinks at work.    Time  8    Period  Weeks    Status  On-going      OT LONG TERM GOAL #4   Title  Patient will decrease right shoulder pain to 2/10 or better for improved ability to complete daily tasks.    Time  8    Period  Weeks    Status  On-going      OT LONG TERM GOAL #5   Title  Patient will decrease fascial restrictions in right shoulder region to minimal for improved ability to complete daily tasks with less pain.    Time  8    Period  Weeks    Status  On-going            Plan - 01/06/19 1124    Clinical Impression Statement  A: Added muscle energy technique during passive stretching to increase tolerance and ROM during Passive stretching. Patient was able to gain slightly more range during shoulder flexion and external rotation. Patient was able to complete passive horizontal abduction this date as she was able to achieve 90 degrees of flexion. VC for form and technique were provided.  Focused a lot of session on manual techniques to decrease fascial restrictions in the RUE.    Body Structure / Function / Physical Skills  ADL;Muscle spasms;UE functional use;Fascial restriction;Pain;Strength;ROM    Plan  P: Continue to follow protocol for ROM restrictions. Attempt protraction supine AA/ROM. PVC pipe slide.    Consulted and Agree with Plan of Care  Patient       Patient will benefit from skilled therapeutic intervention in order to improve the following deficits and impairments:   Body Structure / Function / Physical Skills: ADL, Muscle spasms, UE functional use, Fascial restriction, Pain, Strength, ROM       Visit Diagnosis: Stiffness of right shoulder, not elsewhere classified  Acute pain of right shoulder  Other symptoms and signs involving the musculoskeletal system    Problem List Patient Active Problem List   Diagnosis Date Noted  . Closed fracture of right proximal humerus 11/04/2018  . Primary osteoarthritis involving multiple joints 09/12/2018  . Morbid obesity ([place]) 03/29/2018  . Severe aortic stenosis   . Iron deficiency anemia 10/13/2017  .  Anxiety and depression 04/28/2017  . Encounter for long-term opiate analgesic use 04/28/2017  . Psychophysiological insomnia 04/28/2017  . OA (osteoarthritis) of hip 03/22/2016  . Chronic right hip pain 10/29/2015  . Chronic kidney disease (CKD) stage G3b/A1, moderately decreased glomerular filtration rate (GFR) between 30-44 mL/min/1.73 square meter and albuminuria creatinine ratio less than 30 mg/g 06/03/2015  . Type 2 diabetes mellitus ([place]) 11/09/2014  . Diastolic dysfunction, left ventricle 05/16/2013  . Calcific aortic stenosis 05/16/2013  . Gout 05/12/2013  . Diabetic retinopathy ([place]) 12/17/2012  . Hypertension 06/10/2012  . Hyperlipidemia 06/10/2012  . Hypothyroidism 06/10/2012   Ailene Ravel, OTR/L,CBIS  [phone]  01/06/2019, 11:29 AM  [place] [address] [place], [name], [address] Phone: [phone]   Fax:  [phone]  Name: Sabrina Deleon MRN: [id number] Date of Birth: [birth year]

## 2019-01-07 ENCOUNTER — Encounter (HOSPITAL_COMMUNITY): Payer: Medicare Other

## 2019-01-08 ENCOUNTER — Encounter (HOSPITAL_COMMUNITY): Payer: Self-pay

## 2019-01-08 ENCOUNTER — Ambulatory Visit (HOSPITAL_COMMUNITY): Payer: Worker's Compensation

## 2019-01-08 ENCOUNTER — Other Ambulatory Visit: Payer: Self-pay

## 2019-01-08 DIAGNOSIS — R29898 Other symptoms and signs involving the musculoskeletal system: Secondary | ICD-10-CM | POA: Diagnosis not present

## 2019-01-08 DIAGNOSIS — M25511 Pain in right shoulder: Secondary | ICD-10-CM

## 2019-01-08 DIAGNOSIS — M25611 Stiffness of right shoulder, not elsewhere classified: Secondary | ICD-10-CM

## 2019-01-08 NOTE — Therapy (Signed)
[place], [name], [address] Phone: [phone]   Fax:  [phone]  Occupational Therapy Treatment  Patient Details  Name: Sabrina Deleon MRN: [id number] Date of Birth: Jul 26, 1950 Referring Provider (OT): Dr. Jeannie Fend   Encounter Date: 01/08/2019  OT End of Session - 01/08/19 1709    Visit Number  5    Number of Visits  16    Date for OT Re-Evaluation  02/19/19   mini reassessment on 11/04   Authorization Type  workers compensation 12 visits authorized    Authorization - Visit Number  5    Authorization - Number of Visits  12    OT Start Time  0017    OT Stop Time  1638    OT Time Calculation (min)  35 min    Activity Tolerance  Patient tolerated treatment well    Behavior During Therapy  [facility] for tasks assessed/performed       Past Medical History:  Diagnosis Date  . Anxiety   . Aortic stenosis   . Arthritis   . Chronic kidney disease    Dr. Tami Ribas- noted increase kidney function levels- took pt off Metformin and started on Onglyza for Blood sugar control-pt being followed for this.  Marland Kitchen GERD (gastroesophageal reflux disease)   . Gout   . Heart murmur   . Hypertension   . Hypothyroidism   . Morbid obesity ([place]) 03/29/2018   Patient has elevated BMI with diabetes hyperlipidemia and hypertension  . Pancreatitis   . Type 2 diabetes mellitus ([place])   . Wears glasses   . Wears partial dentures    Upper    Past Surgical History:  Procedure Laterality Date  . BACK SURGERY     Lumbar  . CARPAL TUNNEL RELEASE  11/02/2011   Procedure: CARPAL TUNNEL RELEASE;  Surgeon: Tennis Must, MD;  Location: [name];  Service: Orthopedics;  Laterality: Right;  . CARPAL TUNNEL RELEASE Left 01/14/2015   Procedure: LEFT CARPAL TUNNEL RELEASE;  Surgeon: Leanora Cover, MD;  Location: [place];  Service: Orthopedics;  Laterality: Left;  . CHOLECYSTECTOMY     laparoscopic  .  COLONOSCOPY    . COLONOSCOPY N/A 11/12/2015   Procedure: COLONOSCOPY;  Surgeon: Rogene Houston, MD;  [facility];  Service: Endoscopy;  Laterality: N/A;  9:30  . ESOPHAGOGASTRODUODENOSCOPY N/A 11/12/2015   Procedure: ESOPHAGOGASTRODUODENOSCOPY (EGD);  Surgeon: Rogene Houston, MD;  [facility];  Service: Endoscopy;  Laterality: N/A;  . REVERSE SHOULDER ARTHROPLASTY Right 11/04/2018  . REVERSE SHOULDER ARTHROPLASTY Right 11/04/2018   Procedure: REVERSE SHOULDER ARTHROPLASTY;  Surgeon: Verner Mould, MD;  Location: [place];  Service: Orthopedics;  Laterality: Right;  . RIGHT/LEFT HEART CATH AND CORONARY ANGIOGRAPHY N/A 10/18/2017   Procedure: RIGHT/LEFT HEART CATH AND CORONARY ANGIOGRAPHY;  Surgeon: Burnell Blanks, MD;  Location: [place] [facility];  Service: Cardiovascular;  Laterality: N/A;  . TOTAL HIP ARTHROPLASTY Left 03/22/2016   Procedure: LEFT TOTAL HIP ARTHROPLASTY ANTERIOR APPROACH;  Surgeon: Gaynelle Arabian, MD;  [facility];  Service: Orthopedics;  Laterality: Left;    There were no vitals filed for this visit.  Subjective Assessment - 01/08/19 1608    Subjective   S: I think it's sore from me.    Currently in Pain?  Yes    Pain Score  4     Pain Location  Shoulder    Pain Orientation  Right    Pain Descriptors / Indicators  Aching;Sore;Constant    Pain Type  Acute pain    Pain Radiating Towards  N/A    Pain Onset  1 to 4 weeks ago    Pain Frequency  Constant    Aggravating Factors   Not sure.    Pain Relieving Factors  half of a pain pill has helped some    Effect of Pain on Daily Activities  mod effect on ADLs.    Multiple Pain Sites  No         [facility] OT Assessment - 01/08/19 1610      Assessment   Medical Diagnosis  S/P Right Reverse Total Shoulder Replacement      Precautions   Precautions  Shoulder    Type of Shoulder Precautions  P/AA/A/ROM with limitations of 130 flexion and 40 external rotation, no strengthening                OT Treatments/Exercises (OP) - 01/08/19 1610      Exercises   Exercises  Shoulder      Shoulder Exercises: Supine   Protraction  PROM;5 reps;AAROM;10 reps    Horizontal ABduction  PROM;5 reps;AAROM;10 reps    External Rotation  PROM;5 reps;AAROM;10 reps    Internal Rotation  PROM;5 reps;AAROM;10 reps    Flexion  PROM;5 reps;AAROM;10 reps    ABduction  PROM;5 reps;AAROM;10 reps    Other Supine Exercises  Serratus Anterior punch; 10X with therapist unweighting arm      Shoulder Exercises: ROM/Strengthening   Thumb Tacks  1'    Prot/Ret//Elev/Dep  1'      Manual Therapy   Manual Therapy  Myofascial release    Manual therapy comments  manual therapy completed seperately from all other interventions    Myofascial Release  myofascial release and manual stretching to right upper arm, scapular, and trapezius to decrease fascial and scar restrictions and improve pain free mobility and strength.                 OT Short Term Goals - 12/31/18 1648      OT SHORT TERM GOAL #1   Title  Patient will be educated and independent with HEP for right arm mobility and strength.    Time  4    Period  Weeks    Status  On-going    Target Date  01/22/19      OT SHORT TERM GOAL #2   Title  Patient will improve right shoulder P/ROM to [facility] in order to don shirts and pants with increased independence.    Time  4    Period  Weeks    Status  On-going      OT SHORT TERM GOAL #3   Title  Patient will increase right shoulder strength to 3+/5 for increased ability to lift laundry baskets, her dogs, and items at work.    Time  4    Period  Weeks    Status  On-going      OT SHORT TERM GOAL #4   Title  Patient will decrease pain in right shoulder to 5/10 or less during completing ADL tasks.    Time  4    Period  Weeks    Status  On-going      OT SHORT TERM GOAL #5   Title  Patient will decrease fascial and scar restrictions to moderate in right upper arm for increased  mobility and less pain.  Time  4    Period  Weeks    Status  On-going        OT Long Term Goals - 12/31/18 1648      OT LONG TERM GOAL #1   Title  Patient will return to prior level of independence and use of right arm as dominant with all B/IADLs, work, and leisure tasks.    Time  8    Period  Weeks    Status  On-going      OT LONG TERM GOAL #2   Title  Patient will increase A/ROM to [facility] in right shoulder for improved ability to comb her hair, tuck shirts in, and reach into overhead cabinets.    Time  8    Period  Weeks    Status  On-going      OT LONG TERM GOAL #3   Title  Patient will increase right shoulder strength to 4/5 or better for improved ability to lift boxes of drinks at work.    Time  8    Period  Weeks    Status  On-going      OT LONG TERM GOAL #4   Title  Patient will decrease right shoulder pain to 2/10 or better for improved ability to complete daily tasks.    Time  8    Period  Weeks    Status  On-going      OT LONG TERM GOAL #5   Title  Patient will decrease fascial restrictions in right shoulder region to minimal for improved ability to complete daily tasks with less pain.    Time  8    Period  Weeks    Status  On-going            Plan - 01/08/19 1709    Clinical Impression Statement  A: Progressed to supine AA/ROM and added PVC pipe slide to increase functional mobility and ROM needed for reaching tasks. Patient is able to remain within ROM diameters. VC for form and technique were provided. Manual techniques were completed to address fascial restrictions.    Body Structure / Function / Physical Skills  ADL;Muscle spasms;UE functional use;Fascial restriction;Pain;Strength;ROM    Plan  P: Attempt AA/ROM standing and pulleys. Provide AA/ROM as HEP if appropriate.    Consulted and Agree with Plan of Care  Patient       Patient will benefit from skilled therapeutic intervention in order to improve the following deficits and impairments:    Body Structure / Function / Physical Skills: ADL, Muscle spasms, UE functional use, Fascial restriction, Pain, Strength, ROM       Visit Diagnosis: Stiffness of right shoulder, not elsewhere classified  Acute pain of right shoulder  Other symptoms and signs involving the musculoskeletal system    Problem List Patient Active Problem List   Diagnosis Date Noted  . Closed fracture of right proximal humerus 11/04/2018  . Primary osteoarthritis involving multiple joints 09/12/2018  . Morbid obesity ([place]) 03/29/2018  . Severe aortic stenosis   . Iron deficiency anemia 10/13/2017  . Anxiety and depression 04/28/2017  . Encounter for long-term opiate analgesic use 04/28/2017  . Psychophysiological insomnia 04/28/2017  . OA (osteoarthritis) of hip 03/22/2016  . Chronic right hip pain 10/29/2015  . Chronic kidney disease (CKD) stage G3b/A1, moderately decreased glomerular filtration rate (GFR) between 30-44 mL/min/1.73 square meter and albuminuria creatinine ratio less than 30 mg/g 06/03/2015  . Type 2 diabetes mellitus ([place]) 11/09/2014  . Diastolic dysfunction, left  ventricle 05/16/2013  . Calcific aortic stenosis 05/16/2013  . Gout 05/12/2013  . Diabetic retinopathy ([place]) 12/17/2012  . Hypertension 06/10/2012  . Hyperlipidemia 06/10/2012  . Hypothyroidism 06/10/2012   Ailene Ravel, OTR/L,CBIS  [phone]  01/08/2019, 5:20 PM  [facility] [address] [place], [name], [address] Phone: [phone]   Fax:  [phone]  Name: CONNELLY SPRUELL MRN: [id number] Date of Birth: 06-28-1950

## 2019-01-09 ENCOUNTER — Encounter (HOSPITAL_COMMUNITY): Payer: Medicare Other

## 2019-01-09 ENCOUNTER — Ambulatory Visit (HOSPITAL_COMMUNITY)
Admission: RE | Admit: 2019-01-09 | Discharge: 2019-01-09 | Disposition: A | Discharge status: Home / Self Care | Payer: Medicare Other | Source: Ambulatory Visit | Attending: Nurse Practitioner | Admitting: Nurse Practitioner

## 2019-01-09 DIAGNOSIS — Z780 Asymptomatic menopausal state: Secondary | ICD-10-CM | POA: Diagnosis not present

## 2019-01-09 DIAGNOSIS — M1990 Unspecified osteoarthritis, unspecified site: Secondary | ICD-10-CM | POA: Insufficient documentation

## 2019-01-09 DIAGNOSIS — M810 Age-related osteoporosis without current pathological fracture: Secondary | ICD-10-CM | POA: Diagnosis not present

## 2019-01-14 ENCOUNTER — Encounter (HOSPITAL_COMMUNITY): Payer: Self-pay | Admitting: Occupational Therapy

## 2019-01-14 ENCOUNTER — Other Ambulatory Visit: Payer: Self-pay

## 2019-01-14 ENCOUNTER — Ambulatory Visit (HOSPITAL_COMMUNITY): Payer: Worker's Compensation | Admitting: Occupational Therapy

## 2019-01-14 ENCOUNTER — Encounter (HOSPITAL_COMMUNITY): Payer: Medicare Other

## 2019-01-14 DIAGNOSIS — M25511 Pain in right shoulder: Secondary | ICD-10-CM

## 2019-01-14 DIAGNOSIS — M25611 Stiffness of right shoulder, not elsewhere classified: Secondary | ICD-10-CM

## 2019-01-14 DIAGNOSIS — R29898 Other symptoms and signs involving the musculoskeletal system: Secondary | ICD-10-CM

## 2019-01-14 NOTE — Therapy (Signed)
[place], [name], [address] Phone: [phone]   Fax:  [phone]  Occupational Therapy Treatment  Patient Details  Name: Sabrina Deleon MRN: [id number] Date of Birth: 08-Mar-1951 Referring Provider (OT): Dr. Jeannie Fend   Encounter Date: 01/14/2019  OT End of Session - 01/14/19 1345    Visit Number  6    Number of Visits  16    Date for OT Re-Evaluation  02/19/19   mini reassessment on 11/04   Authorization Type  workers compensation 12 visits authorized    Authorization - Visit Number  6    Authorization - Number of Visits  12    OT Start Time  1300    OT Stop Time  1344    OT Time Calculation (min)  44 min    Activity Tolerance  Patient tolerated treatment well    Behavior During Therapy  [facility] for tasks assessed/performed       Past Medical History:  Diagnosis Date  . Anxiety   . Aortic stenosis   . Arthritis   . Chronic kidney disease    Dr. Tami Ribas- noted increase kidney function levels- took pt off Metformin and started on Onglyza for Blood sugar control-pt being followed for this.  Marland Kitchen GERD (gastroesophageal reflux disease)   . Gout   . Heart murmur   . Hypertension   . Hypothyroidism   . Morbid obesity ([place]) 03/29/2018   Patient has elevated BMI with diabetes hyperlipidemia and hypertension  . Pancreatitis   . Type 2 diabetes mellitus ([place])   . Wears glasses   . Wears partial dentures    Upper    Past Surgical History:  Procedure Laterality Date  . BACK SURGERY     Lumbar  . CARPAL TUNNEL RELEASE  11/02/2011   Procedure: CARPAL TUNNEL RELEASE;  Surgeon: Tennis Must, MD;  Location: [place];  Service: Orthopedics;  Laterality: Right;  . CARPAL TUNNEL RELEASE Left 01/14/2015   Procedure: LEFT CARPAL TUNNEL RELEASE;  Surgeon: Leanora Cover, MD;  Location: [place];  Service: Orthopedics;  Laterality: Left;  . CHOLECYSTECTOMY     laparoscopic  .  COLONOSCOPY    . COLONOSCOPY N/A 11/12/2015   Procedure: COLONOSCOPY;  Surgeon: Rogene Houston, MD;  [facility];  Service: Endoscopy;  Laterality: N/A;  9:30  . ESOPHAGOGASTRODUODENOSCOPY N/A 11/12/2015   Procedure: ESOPHAGOGASTRODUODENOSCOPY (EGD);  Surgeon: Rogene Houston, MD;  [facility];  Service: Endoscopy;  Laterality: N/A;  . REVERSE SHOULDER ARTHROPLASTY Right 11/04/2018  . REVERSE SHOULDER ARTHROPLASTY Right 11/04/2018   Procedure: REVERSE SHOULDER ARTHROPLASTY;  Surgeon: Verner Mould, MD;  Location: [place];  Service: Orthopedics;  Laterality: Right;  . RIGHT/LEFT HEART CATH AND CORONARY ANGIOGRAPHY N/A 10/18/2017   Procedure: RIGHT/LEFT HEART CATH AND CORONARY ANGIOGRAPHY;  Surgeon: Burnell Blanks, MD;  Location: [place] [facility];  Service: Cardiovascular;  Laterality: N/A;  . TOTAL HIP ARTHROPLASTY Left 03/22/2016   Procedure: LEFT TOTAL HIP ARTHROPLASTY ANTERIOR APPROACH;  Surgeon: Gaynelle Arabian, MD;  [facility];  Service: Orthopedics;  Laterality: Left;    There were no vitals filed for this visit.  Subjective Assessment - 01/14/19 1254    Subjective   S: I thought it would heal quicker than this but it's coming along.    Currently in Pain?  No/denies         [facility] OT Assessment - 01/14/19 1254  Assessment   Medical Diagnosis  S/P Right Reverse Total Shoulder Replacement      Precautions   Precautions  Shoulder    Type of Shoulder Precautions  P/AA/A/ROM with limitations of 130 flexion and 40 external rotation, no strengthening               OT Treatments/Exercises (OP) - 01/14/19 1301      Exercises   Exercises  Shoulder      Shoulder Exercises: Supine   Protraction  PROM;5 reps;AAROM;10 reps    Horizontal ABduction  PROM;5 reps;AAROM;10 reps    External Rotation  PROM;5 reps;AAROM;10 reps    Internal Rotation  PROM;5 reps;AAROM;10 reps    Flexion  PROM;5 reps;AAROM;10 reps    ABduction  PROM;5  reps;AAROM;10 reps    Other Supine Exercises  Serratus Anterior punch; 10X with therapist unweighting arm      Shoulder Exercises: Seated   Protraction  AAROM;10 reps    Horizontal ABduction  AAROM;10 reps    External Rotation  AAROM;10 reps    Internal Rotation  AAROM;10 reps    Flexion  AAROM;10 reps    Abduction  AAROM;10 reps      Shoulder Exercises: Pulleys   Flexion  1 minute      Manual Therapy   Manual Therapy  Myofascial release    Manual therapy comments  manual therapy completed seperately from all other interventions    Myofascial Release  myofascial release and manual stretching to right upper arm, scapular, and trapezius to decrease fascial and scar restrictions and improve pain free mobility and strength.                 OT Short Term Goals - 12/31/18 1648      OT SHORT TERM GOAL #1   Title  Patient will be educated and independent with HEP for right arm mobility and strength.    Time  4    Period  Weeks    Status  On-going    Target Date  01/22/19      OT SHORT TERM GOAL #2   Title  Patient will improve right shoulder P/ROM to [facility] in order to don shirts and pants with increased independence.    Time  4    Period  Weeks    Status  On-going      OT SHORT TERM GOAL #3   Title  Patient will increase right shoulder strength to 3+/5 for increased ability to lift laundry baskets, her dogs, and items at work.    Time  4    Period  Weeks    Status  On-going      OT SHORT TERM GOAL #4   Title  Patient will decrease pain in right shoulder to 5/10 or less during completing ADL tasks.    Time  4    Period  Weeks    Status  On-going      OT SHORT TERM GOAL #5   Title  Patient will decrease fascial and scar restrictions to moderate in right upper arm for increased mobility and less pain.    Time  4    Period  Weeks    Status  On-going        OT Long Term Goals - 12/31/18 1648      OT LONG TERM GOAL #1   Title  Patient will return to prior level of  independence and use of right arm as dominant with all B/IADLs, work, and  leisure tasks.    Time  8    Period  Weeks    Status  On-going      OT LONG TERM GOAL #2   Title  Patient will increase A/ROM to [employer] in right shoulder for improved ability to comb her hair, tuck shirts in, and reach into overhead cabinets.    Time  8    Period  Weeks    Status  On-going      OT LONG TERM GOAL #3   Title  Patient will increase right shoulder strength to 4/5 or better for improved ability to lift boxes of drinks at work.    Time  8    Period  Weeks    Status  On-going      OT LONG TERM GOAL #4   Title  Patient will decrease right shoulder pain to 2/10 or better for improved ability to complete daily tasks.    Time  8    Period  Weeks    Status  On-going      OT LONG TERM GOAL #5   Title  Patient will decrease fascial restrictions in right shoulder region to minimal for improved ability to complete daily tasks with less pain.    Time  8    Period  Weeks    Status  On-going            Plan - 01/14/19 1335    Clinical Impression Statement  A: Continued with manual therapy to address fascial restrictions. Completed AA/ROM in supine and added in sitting today, pt achieving slightly greater than 50% ROM with flexion and abduction. Added pulleys in flexion. Occasional rest breaks provided as needed. Verbal cuing for form and technique.    Body Structure / Function / Physical Skills  ADL;Muscle spasms;UE functional use;Fascial restriction;Pain;Strength;ROM    Plan  P: Provide AA/ROM HEP, continue working to improve available ROM during passive stretching-possibly ultrasound immediately prior to passive stretching       Patient will benefit from skilled therapeutic intervention in order to improve the following deficits and impairments:   Body Structure / Function / Physical Skills: ADL, Muscle spasms, UE functional use, Fascial restriction, Pain, Strength, ROM       Visit  Diagnosis: Stiffness of right shoulder, not elsewhere classified  Acute pain of right shoulder  Other symptoms and signs involving the musculoskeletal system    Problem List Patient Active Problem List   Diagnosis Date Noted  . Closed fracture of right proximal humerus 11/04/2018  . Primary osteoarthritis involving multiple joints 09/12/2018  . Morbid obesity ([place]) 03/29/2018  . Severe aortic stenosis   . Iron deficiency anemia 10/13/2017  . Anxiety and depression 04/28/2017  . Encounter for long-term opiate analgesic use 04/28/2017  . Psychophysiological insomnia 04/28/2017  . OA (osteoarthritis) of hip 03/22/2016  . Chronic right hip pain 10/29/2015  . Chronic kidney disease (CKD) stage G3b/A1, moderately decreased glomerular filtration rate (GFR) between 30-44 mL/min/1.73 square meter and albuminuria creatinine ratio less than 30 mg/g 06/03/2015  . Type 2 diabetes mellitus ([place]) 11/09/2014  . Diastolic dysfunction, left ventricle 05/16/2013  . Calcific aortic stenosis 05/16/2013  . Gout 05/12/2013  . Diabetic retinopathy ([place]) 12/17/2012  . Hypertension 06/10/2012  . Hyperlipidemia 06/10/2012  . Hypothyroidism 06/10/2012   Guadelupe Sabin, OTR/L  [phone] 01/14/2019, 1:46 PM  [place] [address] [place], [name], [address] Phone: [phone]   Fax:  [phone]  Name: Sabrina Deleon  [place] MRN: [id number] Date of Birth: October 05, 1950

## 2019-01-16 ENCOUNTER — Encounter (HOSPITAL_COMMUNITY): Payer: Medicare Other

## 2019-01-16 ENCOUNTER — Encounter (HOSPITAL_COMMUNITY): Payer: Self-pay | Admitting: Occupational Therapy

## 2019-01-16 ENCOUNTER — Other Ambulatory Visit: Payer: Self-pay

## 2019-01-16 ENCOUNTER — Ambulatory Visit (HOSPITAL_COMMUNITY): Payer: Worker's Compensation | Admitting: Occupational Therapy

## 2019-01-16 DIAGNOSIS — R29898 Other symptoms and signs involving the musculoskeletal system: Secondary | ICD-10-CM

## 2019-01-16 DIAGNOSIS — M25511 Pain in right shoulder: Secondary | ICD-10-CM

## 2019-01-16 DIAGNOSIS — M25611 Stiffness of right shoulder, not elsewhere classified: Secondary | ICD-10-CM

## 2019-01-16 NOTE — Therapy (Addendum)
[place], [name], [address] Phone: [phone]   Fax:  [phone]  Occupational Therapy Treatment  Patient Details  Name: Sabrina Deleon MRN: [id number] Date of Birth: 1950-05-08 Referring Provider (OT): Dr. Jeannie Fend   Encounter Date: 01/16/2019  OT End of Session - 01/16/19 1150    Visit Number  7    Number of Visits  16    Date for OT Re-Evaluation  02/19/19   mini reassessment on 11/04   Authorization Type  workers compensation 12 visits authorized    Authorization - Visit Number  7    Authorization - Number of Visits  12    OT Start Time  1106    OT Stop Time  1147    OT Time Calculation (min)  41 min    Activity Tolerance  Patient tolerated treatment well    Behavior During Therapy  [facility] for tasks assessed/performed       Past Medical History:  Diagnosis Date  . Anxiety   . Aortic stenosis   . Arthritis   . Chronic kidney disease    Dr. Tami Ribas- noted increase kidney function levels- took pt off Metformin and started on Onglyza for Blood sugar control-pt being followed for this.  Marland Kitchen GERD (gastroesophageal reflux disease)   . Gout   . Heart murmur   . Hypertension   . Hypothyroidism   . Morbid obesity ([place]) 03/29/2018   Patient has elevated BMI with diabetes hyperlipidemia and hypertension  . Pancreatitis   . Type 2 diabetes mellitus ([place])   . Wears glasses   . Wears partial dentures    Upper    Past Surgical History:  Procedure Laterality Date  . BACK SURGERY     Lumbar  . CARPAL TUNNEL RELEASE  11/02/2011   Procedure: CARPAL TUNNEL RELEASE;  Surgeon: Tennis Must, MD;  Location: [place];  Service: Orthopedics;  Laterality: Right;  . CARPAL TUNNEL RELEASE Left 01/14/2015   Procedure: LEFT CARPAL TUNNEL RELEASE;  Surgeon: Leanora Cover, MD;  Location: [place];  Service: Orthopedics;  Laterality: Left;  . CHOLECYSTECTOMY     laparoscopic  .  COLONOSCOPY    . COLONOSCOPY N/A 11/12/2015   Procedure: COLONOSCOPY;  Surgeon: Rogene Houston, MD;  [facility];  Service: Endoscopy;  Laterality: N/A;  9:30  . ESOPHAGOGASTRODUODENOSCOPY N/A 11/12/2015   Procedure: ESOPHAGOGASTRODUODENOSCOPY (EGD);  Surgeon: Rogene Houston, MD;  [facility];  Service: Endoscopy;  Laterality: N/A;  . REVERSE SHOULDER ARTHROPLASTY Right 11/04/2018  . REVERSE SHOULDER ARTHROPLASTY Right 11/04/2018   Procedure: REVERSE SHOULDER ARTHROPLASTY;  Surgeon: Verner Mould, MD;  Location: [place];  Service: Orthopedics;  Laterality: Right;  . RIGHT/LEFT HEART CATH AND CORONARY ANGIOGRAPHY N/A 10/18/2017   Procedure: RIGHT/LEFT HEART CATH AND CORONARY ANGIOGRAPHY;  Surgeon: Burnell Blanks, MD;  Location: [place] [facility];  Service: Cardiovascular;  Laterality: N/A;  . TOTAL HIP ARTHROPLASTY Left 03/22/2016   Procedure: LEFT TOTAL HIP ARTHROPLASTY ANTERIOR APPROACH;  Surgeon: Gaynelle Arabian, MD;  [facility];  Service: Orthopedics;  Laterality: Left;    There were no vitals filed for this visit.  Subjective Assessment - 01/16/19 1104    Subjective   S: It's just the regular achy stuff today.    Currently in Pain?  Yes    Pain Score  3     Pain Location  Shoulder    Pain Orientation  Right    Pain Descriptors / Indicators  Aching;Sore    Pain Type  Acute pain    Pain Radiating Towards  N/A    Pain Onset  In the past 7 days    Pain Frequency  Intermittent    Aggravating Factors   unsure    Pain Relieving Factors  pain medication    Effect of Pain on Daily Activities  mod effect on ADLs    Multiple Pain Sites  No         [facility] OT Assessment - 01/16/19 1104      Assessment   Medical Diagnosis  S/P Right Reverse Total Shoulder Replacement      Precautions   Precautions  Shoulder    Type of Shoulder Precautions  P/AA/A/ROM with limitations of 130 flexion and 40 external rotation, no strengthening                OT Treatments/Exercises (OP) - 01/16/19 1105      Exercises   Exercises  Shoulder      Shoulder Exercises: Supine   Protraction  PROM;5 reps;AAROM;12 reps    Horizontal ABduction  PROM;5 reps;AAROM;12 reps    External Rotation  PROM;5 reps;AAROM;12 reps    Internal Rotation  PROM;5 reps;AAROM;12 reps    Flexion  PROM;5 reps;AAROM;12 reps    ABduction  PROM;5 reps;AAROM;12 reps    Other Supine Exercises  Serratus Anterior punch; 10X       Shoulder Exercises: Seated   Protraction  AAROM;10 reps    Horizontal ABduction  AAROM;10 reps    External Rotation  AAROM;10 reps    Internal Rotation  AAROM;10 reps    Flexion  AAROM;10 reps    Abduction  AAROM;10 reps      Shoulder Exercises: Pulleys   Flexion  1 minute    ABduction  1 minute      Manual Therapy   Manual Therapy  Myofascial release    Manual therapy comments  manual therapy completed seperately from all other interventions    Myofascial Release  myofascial release and manual stretching to right upper arm, scapular, and trapezius to decrease fascial and scar restrictions and improve pain free mobility and strength.                 OT Short Term Goals - 12/31/18 1648      OT SHORT TERM GOAL #1   Title  Patient will be educated and independent with HEP for right arm mobility and strength.    Time  4    Period  Weeks    Status  On-going    Target Date  01/22/19      OT SHORT TERM GOAL #2   Title  Patient will improve right shoulder P/ROM to [facility] in order to don shirts and pants with increased independence.    Time  4    Period  Weeks    Status  On-going      OT SHORT TERM GOAL #3   Title  Patient will increase right shoulder strength to 3+/5 for increased ability to lift laundry baskets, her dogs, and items at work.    Time  4    Period  Weeks    Status  On-going      OT SHORT TERM GOAL #4   Title  Patient will decrease pain in right shoulder to 5/10 or less during completing ADL  tasks.    Time  4    Period  Weeks    Status  On-going      OT SHORT TERM GOAL #5   Title  Patient will decrease fascial and scar restrictions to moderate in right upper arm for increased mobility and less pain.    Time  4    Period  Weeks    Status  On-going        OT Long Term Goals - 12/31/18 1648      OT LONG TERM GOAL #1   Title  Patient will return to prior level of independence and use of right arm as dominant with all B/IADLs, work, and leisure tasks.    Time  8    Period  Weeks    Status  On-going      OT LONG TERM GOAL #2   Title  Patient will increase A/ROM to [facility] in right shoulder for improved ability to comb her hair, tuck shirts in, and reach into overhead cabinets.    Time  8    Period  Weeks    Status  On-going      OT LONG TERM GOAL #3   Title  Patient will increase right shoulder strength to 4/5 or better for improved ability to lift boxes of drinks at work.    Time  8    Period  Weeks    Status  On-going      OT LONG TERM GOAL #4   Title  Patient will decrease right shoulder pain to 2/10 or better for improved ability to complete daily tasks.    Time  8    Period  Weeks    Status  On-going      OT LONG TERM GOAL #5   Title  Patient will decrease fascial restrictions in right shoulder region to minimal for improved ability to complete daily tasks with less pain.    Time  8    Period  Weeks    Status  On-going            Plan - 01/16/19 1136    Clinical Impression Statement  A: Continued with manual therapy to address fascial restrictions and scar restrictions in right upper arm and trapezius regions. Continued with passive stretching and AA/ROM. Added pulleys in abduction. Verbal cuing for form and technique.    Body Structure / Function / Physical Skills  ADL;Muscle spasms;UE functional use;Fascial restriction;Pain;Strength;ROM    Plan  P: Mini-reassessment, FOTO, Attempt ultrasound prior to passive stretching, follow up on HEP        Patient will benefit from skilled therapeutic intervention in order to improve the following deficits and impairments:   Body Structure / Function / Physical Skills: ADL, Muscle spasms, UE functional use, Fascial restriction, Pain, Strength, ROM       Visit Diagnosis: Stiffness of right shoulder, not elsewhere classified  Acute pain of right shoulder  Other symptoms and signs involving the musculoskeletal system    Problem List Patient Active Problem List   Diagnosis Date Noted  . Closed fracture of right proximal humerus 11/04/2018  . Primary osteoarthritis involving multiple joints 09/12/2018  . Morbid obesity ([place]) 03/29/2018  . Severe aortic stenosis   . Iron deficiency anemia 10/13/2017  . Anxiety and depression 04/28/2017  . Encounter for long-term opiate analgesic use 04/28/2017  . Psychophysiological insomnia 04/28/2017  . OA (osteoarthritis) of hip 03/22/2016  . Chronic right hip pain 10/29/2015  . Chronic kidney disease (CKD) stage G3b/A1, moderately decreased glomerular filtration rate (GFR) between 30-44 mL/min/1.73  square meter and albuminuria creatinine ratio less than 30 mg/g 06/03/2015  . Type 2 diabetes mellitus ([place]) 11/09/2014  . Diastolic dysfunction, left ventricle 05/16/2013  . Calcific aortic stenosis 05/16/2013  . Gout 05/12/2013  . Diabetic retinopathy ([place]) 12/17/2012  . Hypertension 06/10/2012  . Hyperlipidemia 06/10/2012  . Hypothyroidism 06/10/2012   Guadelupe Sabin, OTR/L  [phone] 01/16/2019, 11:51 AM  [place] [address] [place], [name], [address] Phone: [phone]   Fax:  [phone]  Name: Sabrina Deleon MRN: [id number] Date of Birth: [birth year]

## 2019-01-16 NOTE — Patient Instructions (Signed)

## 2019-01-20 ENCOUNTER — Encounter (HOSPITAL_COMMUNITY): Payer: Medicare Other | Admitting: Physical Therapy

## 2019-01-21 ENCOUNTER — Encounter (HOSPITAL_COMMUNITY): Payer: Self-pay | Admitting: Occupational Therapy

## 2019-01-21 ENCOUNTER — Other Ambulatory Visit: Payer: Self-pay

## 2019-01-21 ENCOUNTER — Ambulatory Visit (HOSPITAL_COMMUNITY): Payer: Worker's Compensation | Attending: Family Medicine | Admitting: Occupational Therapy

## 2019-01-21 DIAGNOSIS — M25611 Stiffness of right shoulder, not elsewhere classified: Secondary | ICD-10-CM | POA: Insufficient documentation

## 2019-01-21 DIAGNOSIS — R29898 Other symptoms and signs involving the musculoskeletal system: Secondary | ICD-10-CM | POA: Diagnosis present

## 2019-01-21 DIAGNOSIS — M25511 Pain in right shoulder: Secondary | ICD-10-CM | POA: Insufficient documentation

## 2019-01-21 NOTE — Therapy (Signed)
[facility] [address] [place], [name], [address] Phone: [phone]   Fax:  [phone]  Occupational Therapy Treatment  Patient Details  Name: Sabrina Deleon MRN: [id number] Date of Birth: [birth year] Referring Provider (OT): Dr. Jeannie Fend   Progress Note Reporting Period 12/25/2018 to 01/21/2019  See note below for Objective Data and Assessment of Progress/Goals.       Encounter Date: 01/21/2019  OT End of Session - 01/21/19 1419    Visit Number  8    Number of Visits  16    Date for OT Re-Evaluation  02/19/19    Authorization Type  workers compensation 12 visits authorized    Authorization - Visit Number  8    Authorization - Number of Visits  12    OT Start Time  1300    OT Stop Time  1343    OT Time Calculation (min)  43 min    Activity Tolerance  Patient tolerated treatment well    Behavior During Therapy  WFL for tasks assessed/performed       Past Medical History:  Diagnosis Date  . Anxiety   . Aortic stenosis   . Arthritis   . Chronic kidney disease    Dr. Tami Ribas- noted increase kidney function levels- took pt off Metformin and started on Onglyza for Blood sugar control-pt being followed for this.  Marland Kitchen GERD (gastroesophageal reflux disease)   . Gout   . Heart murmur   . Hypertension   . Hypothyroidism   . Morbid obesity ([place]) 03/29/2018   Patient has elevated BMI with diabetes hyperlipidemia and hypertension  . Pancreatitis   . Type 2 diabetes mellitus ([place])   . Wears glasses   . Wears partial dentures    Upper    Past Surgical History:  Procedure Laterality Date  . BACK SURGERY     Lumbar  . CARPAL TUNNEL RELEASE  11/02/2011   Procedure: CARPAL TUNNEL RELEASE;  Surgeon: Tennis Must, MD;  Location: [facility];  Service: Orthopedics;  Laterality: Right;  . CARPAL TUNNEL RELEASE Left 01/14/2015   Procedure: LEFT CARPAL TUNNEL RELEASE;  Surgeon: Leanora Cover, MD;  Location: [facility];  Service: Orthopedics;  Laterality: Left;  . CHOLECYSTECTOMY     laparoscopic  . COLONOSCOPY    . COLONOSCOPY N/A 11/12/2015   Procedure: COLONOSCOPY;  Surgeon: Rogene Houston, MD;  [facility];  Service: Endoscopy;  Laterality: N/A;  9:30  . ESOPHAGOGASTRODUODENOSCOPY N/A 11/12/2015   Procedure: ESOPHAGOGASTRODUODENOSCOPY (EGD);  Surgeon: Rogene Houston, MD;  [facility];  Service: Endoscopy;  Laterality: N/A;  . REVERSE SHOULDER ARTHROPLASTY Right 11/04/2018  . REVERSE SHOULDER ARTHROPLASTY Right 11/04/2018   Procedure: REVERSE SHOULDER ARTHROPLASTY;  Surgeon: Verner Mould, MD;  Location: [facility];  Service: Orthopedics;  Laterality: Right;  . RIGHT/LEFT HEART CATH AND CORONARY ANGIOGRAPHY N/A 10/18/2017   Procedure: RIGHT/LEFT HEART CATH AND CORONARY ANGIOGRAPHY;  Surgeon: Burnell Blanks, MD;  Location: [facility];  Service: Cardiovascular;  Laterality: N/A;  . TOTAL HIP ARTHROPLASTY Left 03/22/2016   Procedure: LEFT TOTAL HIP ARTHROPLASTY ANTERIOR APPROACH;  Surgeon: Gaynelle Arabian, MD;  [facility];  Service: Orthopedics;  Laterality: Left;    There were no vitals filed for this visit.  Subjective Assessment - 01/21/19 1256    Subjective   S: Sunday it hurt all day but today it feels good.    Currently in Pain?  No/denies         [facility] OT Assessment - 01/21/19 1255      Assessment   Medical Diagnosis  S/P Right Reverse Total Shoulder Replacement      Precautions   Precautions  Shoulder    Type of Shoulder Precautions  P/AA/A/ROM with limitations of 130 flexion and 40 external rotation, no strengthening      Observation/Other Assessments   Focus on Therapeutic Outcomes (FOTO)   47/100   same as previous     AROM   Overall AROM Comments  assessed in seated, external and internal rotation with arm adducted    AROM Assessment Site  Shoulder    Right Shoulder Flexion  104 Degrees   74 previous   Right Shoulder  ABduction  103 Degrees   55 previous   Right Shoulder Internal Rotation  90 Degrees   70 previous   Right Shoulder External Rotation  35 Degrees   same as previous     PROM   Overall PROM Comments  assessed in supine, external and internal rotation with shoulder adducted    PROM Assessment Site  Shoulder    Right/Left Shoulder  Right    Right Shoulder Flexion  121 Degrees   85 previous   Right Shoulder ABduction  115 Degrees   60 previous   Right Shoulder Internal Rotation  90 Degrees   70 previous   Right Shoulder External Rotation  27 Degrees   10 previous     Strength   Overall Strength Comments  assessed seated, er/IR adducted    Right/Left Shoulder  Right    Right Shoulder Flexion  3/5   not previously assessed   Right Shoulder ABduction  3/5   not previously assessed   Right Shoulder Internal Rotation  3+/5   not previously assessed   Right Shoulder External Rotation  3-/5   not previously assessed              OT Treatments/Exercises (OP) - 01/21/19 1303      Exercises   Exercises  Shoulder      Shoulder Exercises: Supine   Protraction  PROM;5 reps;AAROM;12 reps    Horizontal ABduction  PROM;5 reps;AAROM;12 reps    External Rotation  PROM;5 reps;AAROM;12 reps    Internal Rotation  PROM;5 reps;AAROM;12 reps    Flexion  PROM;5 reps;AAROM;12 reps    ABduction  PROM;5 reps;AAROM;12 reps      Modalities   Modalities  Ultrasound      Ultrasound   Ultrasound Location  anterior shoulder and upper arm along scar    Ultrasound Parameters  1.5W/cm2; 5 minutes    Ultrasound Goals  Other (Comment)   decrease scar and fascial restrictions     Manual Therapy   Manual Therapy  Myofascial release    Manual therapy comments  manual therapy completed seperately from all other interventions    Myofascial Release  myofascial release and manual stretching to right upper arm, scapular, and trapezius to decrease fascial and scar restrictions and improve pain free  mobility and strength.                 OT Short Term Goals - 12/31/18 1648      OT SHORT TERM GOAL #1   Title  Patient will be educated and independent with HEP for right arm mobility and strength.    Time  4    Period  Weeks    Status  On-going  Target Date  01/22/19      OT SHORT TERM GOAL #2   Title  Patient will improve right shoulder P/ROM to [facility] in order to don shirts and pants with increased independence.    Time  4    Period  Weeks    Status  On-going      OT SHORT TERM GOAL #3   Title  Patient will increase right shoulder strength to 3+/5 for increased ability to lift laundry baskets, her dogs, and items at work.    Time  4    Period  Weeks    Status  On-going      OT SHORT TERM GOAL #4   Title  Patient will decrease pain in right shoulder to 5/10 or less during completing ADL tasks.    Time  4    Period  Weeks    Status  On-going      OT SHORT TERM GOAL #5   Title  Patient will decrease fascial and scar restrictions to moderate in right upper arm for increased mobility and less pain.    Time  4    Period  Weeks    Status  On-going        OT Long Term Goals - 12/31/18 1648      OT LONG TERM GOAL #1   Title  Patient will return to prior level of independence and use of right arm as dominant with all B/IADLs, work, and leisure tasks.    Time  8    Period  Weeks    Status  On-going      OT LONG TERM GOAL #2   Title  Patient will increase A/ROM to [facility] in right shoulder for improved ability to comb her hair, tuck shirts in, and reach into overhead cabinets.    Time  8    Period  Weeks    Status  On-going      OT LONG TERM GOAL #3   Title  Patient will increase right shoulder strength to 4/5 or better for improved ability to lift boxes of drinks at work.    Time  8    Period  Weeks    Status  On-going      OT LONG TERM GOAL #4   Title  Patient will decrease right shoulder pain to 2/10 or better for improved ability to complete daily tasks.     Time  8    Period  Weeks    Status  On-going      OT LONG TERM GOAL #5   Title  Patient will decrease fascial restrictions in right shoulder region to minimal for improved ability to complete daily tasks with less pain.    Time  8    Period  Weeks    Status  On-going            Plan - 01/21/19 1331    Clinical Impression Statement  A: Continued with manual therapy and added ultrasound at beginning of session prior to passive stretching to decrease scar restrictions and improve ROM. Mini-reassessment completed this session, pt is making slow and steady progress towards goals with imrpovements in ROM, strength, and functional use of RUE during ADLs. Continued with AA/ROM today, pt with improved ROM in flexion and abduction today. Verbal cuing for form and technique.    Body Structure / Function / Physical Skills  ADL;Muscle spasms;UE functional use;Fascial restriction;Pain;Strength;ROM    Plan  P: Continue with [place] prior to  stretching, continue working to increase ROM within pain tolerance, resume missed exercises       Patient will benefit from skilled therapeutic intervention in order to improve the following deficits and impairments:   Body Structure / Function / Physical Skills: ADL, Muscle spasms, UE functional use, Fascial restriction, Pain, Strength, ROM       Visit Diagnosis: Stiffness of right shoulder, not elsewhere classified  Acute pain of right shoulder  Other symptoms and signs involving the musculoskeletal system    Problem List Patient Active Problem List   Diagnosis Date Noted  . Closed fracture of right proximal humerus 11/04/2018  . Primary osteoarthritis involving multiple joints 09/12/2018  . Morbid obesity ([place]) 03/29/2018  . Severe aortic stenosis   . Iron deficiency anemia 10/13/2017  . Anxiety and depression 04/28/2017  . Encounter for long-term opiate analgesic use 04/28/2017  . Psychophysiological insomnia 04/28/2017  . OA (osteoarthritis) of  hip 03/22/2016  . Chronic right hip pain 10/29/2015  . Chronic kidney disease (CKD) stage G3b/A1, moderately decreased glomerular filtration rate (GFR) between 30-44 mL/min/1.73 square meter and albuminuria creatinine ratio less than 30 mg/g 06/03/2015  . Type 2 diabetes mellitus ([place]) 11/09/2014  . Diastolic dysfunction, left ventricle 05/16/2013  . Calcific aortic stenosis 05/16/2013  . Gout 05/12/2013  . Diabetic retinopathy ([place]) 12/17/2012  . Hypertension 06/10/2012  . Hyperlipidemia 06/10/2012  . Hypothyroidism 06/10/2012   Guadelupe Sabin, OTR/L  [phone] 01/21/2019, 2:31 PM  [facility] [address] [place], [name], [address] Phone: [phone]   Fax:  [phone]  Name: Sabrina Deleon MRN: [id number] Date of Birth: 14-Oct-1950

## 2019-01-22 ENCOUNTER — Encounter (HOSPITAL_COMMUNITY): Payer: Medicare Other | Admitting: Physical Therapy

## 2019-01-23 ENCOUNTER — Ambulatory Visit (HOSPITAL_COMMUNITY): Payer: Worker's Compensation

## 2019-01-23 ENCOUNTER — Encounter (HOSPITAL_COMMUNITY): Payer: Self-pay

## 2019-01-23 ENCOUNTER — Other Ambulatory Visit: Payer: Self-pay

## 2019-01-23 DIAGNOSIS — M25511 Pain in right shoulder: Secondary | ICD-10-CM

## 2019-01-23 DIAGNOSIS — M25611 Stiffness of right shoulder, not elsewhere classified: Secondary | ICD-10-CM

## 2019-01-23 DIAGNOSIS — R29898 Other symptoms and signs involving the musculoskeletal system: Secondary | ICD-10-CM

## 2019-01-23 NOTE — Therapy (Signed)
[facility] [place], [name], [address] Phone: [phone]   Fax:  [phone]  Occupational Therapy Treatment  Patient Details  Name: Sabrina Deleon MRN: [id number] Date of Birth: Sep 07, 1950 Referring Provider (OT): Dr. Jeannie Fend   Encounter Date: 01/23/2019  OT End of Session - 01/23/19 1152    Visit Number  9    Number of Visits  16    Date for OT Re-Evaluation  02/19/19    Authorization Type  workers compensation 12 visits authorized    Authorization - Visit Number  9    Authorization - Number of Visits  12    OT Start Time  1116    OT Stop Time  1155    OT Time Calculation (min)  39 min    Activity Tolerance  Patient tolerated treatment well    Behavior During Therapy  [facility] for tasks assessed/performed       Past Medical History:  Diagnosis Date  . Anxiety   . Aortic stenosis   . Arthritis   . Chronic kidney disease    Dr. Tami Ribas- noted increase kidney function levels- took pt off Metformin and started on Onglyza for Blood sugar control-pt being followed for this.  Marland Kitchen GERD (gastroesophageal reflux disease)   . Gout   . Heart murmur   . Hypertension   . Hypothyroidism   . Morbid obesity ([place]) 03/29/2018   Patient has elevated BMI with diabetes hyperlipidemia and hypertension  . Pancreatitis   . Type 2 diabetes mellitus ([place])   . Wears glasses   . Wears partial dentures    Upper    Past Surgical History:  Procedure Laterality Date  . BACK SURGERY     Lumbar  . CARPAL TUNNEL RELEASE  11/02/2011   Procedure: CARPAL TUNNEL RELEASE;  Surgeon: Tennis Must, MD;  Location: [facility];  Service: Orthopedics;  Laterality: Right;  . CARPAL TUNNEL RELEASE Left 01/14/2015   Procedure: LEFT CARPAL TUNNEL RELEASE;  Surgeon: Leanora Cover, MD;  Location: [facility];  Service: Orthopedics;  Laterality: Left;  . CHOLECYSTECTOMY     laparoscopic  . COLONOSCOPY    . COLONOSCOPY N/A  11/12/2015   Procedure: COLONOSCOPY;  Surgeon: Rogene Houston, MD;  [facility];  Service: Endoscopy;  Laterality: N/A;  9:30  . ESOPHAGOGASTRODUODENOSCOPY N/A 11/12/2015   Procedure: ESOPHAGOGASTRODUODENOSCOPY (EGD);  Surgeon: Rogene Houston, MD;  [facility];  Service: Endoscopy;  Laterality: N/A;  . REVERSE SHOULDER ARTHROPLASTY Right 11/04/2018  . REVERSE SHOULDER ARTHROPLASTY Right 11/04/2018   Procedure: REVERSE SHOULDER ARTHROPLASTY;  Surgeon: Verner Mould, MD;  Location: [facility];  Service: Orthopedics;  Laterality: Right;  . RIGHT/LEFT HEART CATH AND CORONARY ANGIOGRAPHY N/A 10/18/2017   Procedure: RIGHT/LEFT HEART CATH AND CORONARY ANGIOGRAPHY;  Surgeon: Burnell Blanks, MD;  Location: [facility];  Service: Cardiovascular;  Laterality: N/A;  . TOTAL HIP ARTHROPLASTY Left 03/22/2016   Procedure: LEFT TOTAL HIP ARTHROPLASTY ANTERIOR APPROACH;  Surgeon: Gaynelle Arabian, MD;  [facility];  Service: Orthopedics;  Laterality: Left;    There were no vitals filed for this visit.  Subjective Assessment - 01/23/19 1148    Subjective   S: Yesterday was a really bad day for my shoulder.    Currently in Pain?  Yes    Pain Score  3     Pain Location  Other (Comment)   medial to incision proximal to  shoulder   Pain Orientation  Right    Pain Descriptors / Indicators  Aching;Sore    Pain Type  Acute pain    Pain Radiating Towards  N/A    Pain Onset  1 to 4 weeks ago    Pain Frequency  Constant    Aggravating Factors   Unsure    Pain Relieving Factors  heat    Effect of Pain on Daily Activities  mod effect    Multiple Pain Sites  No         [facility] OT Assessment - 01/23/19 1151      Assessment   Medical Diagnosis  S/P Right Reverse Total Shoulder Replacement      Precautions   Precautions  Shoulder    Type of Shoulder Precautions  P/AA/A/ROM with limitations of 130 flexion and 40 external rotation, no strengthening                OT Treatments/Exercises (OP) - 01/23/19 1151      Exercises   Exercises  Shoulder      Shoulder Exercises: Supine   Protraction  PROM;5 reps;AAROM;12 reps    Horizontal ABduction  PROM;5 reps;AAROM;12 reps    External Rotation  PROM;5 reps;AAROM;12 reps    Internal Rotation  PROM;5 reps;AAROM;12 reps    Flexion  PROM;5 reps;AAROM;12 reps    ABduction  PROM;5 reps;AAROM;12 reps      Modalities   Modalities  Ultrasound      Ultrasound   Ultrasound Location  anterior shoulder region along medial and lateral border of healed incision    Ultrasound Parameters  1.5 W/cm2 ; 5 minutes    Ultrasound Goals  Pain      Manual Therapy   Manual Therapy  Myofascial release    Manual therapy comments  manual therapy completed seperately from all other interventions    Myofascial Release  myofascial release and manual stretching to right upper arm, scapular, and trapezius to decrease fascial and scar restrictions and improve pain free mobility and strength.                 OT Short Term Goals - 12/31/18 1648      OT SHORT TERM GOAL #1   Title  Patient will be educated and independent with HEP for right arm mobility and strength.    Time  4    Period  Weeks    Status  On-going    Target Date  01/22/19      OT SHORT TERM GOAL #2   Title  Patient will improve right shoulder P/ROM to [facility] in order to don shirts and pants with increased independence.    Time  4    Period  Weeks    Status  On-going      OT SHORT TERM GOAL #3   Title  Patient will increase right shoulder strength to 3+/5 for increased ability to lift laundry baskets, her dogs, and items at work.    Time  4    Period  Weeks    Status  On-going      OT SHORT TERM GOAL #4   Title  Patient will decrease pain in right shoulder to 5/10 or less during completing ADL tasks.    Time  4    Period  Weeks    Status  On-going      OT SHORT TERM GOAL #5   Title  Patient will decrease fascial and scar  restrictions to moderate in  right upper arm for increased mobility and less pain.    Time  4    Period  Weeks    Status  On-going        OT Long Term Goals - 12/31/18 1648      OT LONG TERM GOAL #1   Title  Patient will return to prior level of independence and use of right arm as dominant with all B/IADLs, work, and leisure tasks.    Time  8    Period  Weeks    Status  On-going      OT LONG TERM GOAL #2   Title  Patient will increase A/ROM to [facility] in right shoulder for improved ability to comb her hair, tuck shirts in, and reach into overhead cabinets.    Time  8    Period  Weeks    Status  On-going      OT LONG TERM GOAL #3   Title  Patient will increase right shoulder strength to 4/5 or better for improved ability to lift boxes of drinks at work.    Time  8    Period  Weeks    Status  On-going      OT LONG TERM GOAL #4   Title  Patient will decrease right shoulder pain to 2/10 or better for improved ability to complete daily tasks.    Time  8    Period  Weeks    Status  On-going      OT LONG TERM GOAL #5   Title  Patient will decrease fascial restrictions in right shoulder region to minimal for improved ability to complete daily tasks with less pain.    Time  8    Period  Weeks    Status  On-going            Plan - 01/23/19 1153    Clinical Impression Statement  A: Arrived with reports of increased pain/discomfort while having a really hard day on Tuesday. Session focused on pain management with use of [place] and manual techniques. Patient with increased tightness and fascial restrictions especially at the [facility] joint. Pt had good response to manual techniques. Pt able to complete supine AA/ROM to finish session. VC for form and technique.    Body Structure / Function / Physical Skills  ADL;Muscle spasms;UE functional use;Fascial restriction;Pain;Strength;ROM    Plan  P: Continue with [place] followed by manual techniques prior to stretching. Resume missed exercises. Follow up  on pain and discomfort. Follow up on MD appointment.    Consulted and Agree with Plan of Care  Patient       Patient will benefit from skilled therapeutic intervention in order to improve the following deficits and impairments:   Body Structure / Function / Physical Skills: ADL, Muscle spasms, UE functional use, Fascial restriction, Pain, Strength, ROM       Visit Diagnosis: Acute pain of right shoulder  Other symptoms and signs involving the musculoskeletal system  Stiffness of right shoulder, not elsewhere classified    Problem List Patient Active Problem List   Diagnosis Date Noted  . Closed fracture of right proximal humerus 11/04/2018  . Primary osteoarthritis involving multiple joints 09/12/2018  . Morbid obesity ([place]) 03/29/2018  . Severe aortic stenosis   . Iron deficiency anemia 10/13/2017  . Anxiety and depression 04/28/2017  . Encounter for long-term opiate analgesic use 04/28/2017  . Psychophysiological insomnia 04/28/2017  . OA (osteoarthritis) of hip 03/22/2016  . Chronic right hip pain  10/29/2015  . Chronic kidney disease (CKD) stage G3b/A1, moderately decreased glomerular filtration rate (GFR) between 30-44 mL/min/1.73 square meter and albuminuria creatinine ratio less than 30 mg/g 06/03/2015  . Type 2 diabetes mellitus ([place]) 11/09/2014  . Diastolic dysfunction, left ventricle 05/16/2013  . Calcific aortic stenosis 05/16/2013  . Gout 05/12/2013  . Diabetic retinopathy ([place]) 12/17/2012  . Hypertension 06/10/2012  . Hyperlipidemia 06/10/2012  . Hypothyroidism 06/10/2012   Ailene Ravel, OTR/L,CBIS  [phone]  01/23/2019, 12:31 PM  [facility] [address] [place], [name], [address] Phone: [phone]   Fax:  [phone]  Name: Sabrina Deleon MRN: [id number] Date of Birth: 12-Feb-1951

## 2019-01-28 ENCOUNTER — Ambulatory Visit (HOSPITAL_COMMUNITY): Payer: Worker's Compensation | Admitting: Occupational Therapy

## 2019-01-28 ENCOUNTER — Other Ambulatory Visit: Payer: Self-pay

## 2019-01-28 ENCOUNTER — Encounter (HOSPITAL_COMMUNITY): Payer: Self-pay | Admitting: Occupational Therapy

## 2019-01-28 ENCOUNTER — Encounter (HOSPITAL_COMMUNITY): Payer: Medicare Other

## 2019-01-28 DIAGNOSIS — R29898 Other symptoms and signs involving the musculoskeletal system: Secondary | ICD-10-CM

## 2019-01-28 DIAGNOSIS — M25511 Pain in right shoulder: Secondary | ICD-10-CM

## 2019-01-28 DIAGNOSIS — M25611 Stiffness of right shoulder, not elsewhere classified: Secondary | ICD-10-CM | POA: Diagnosis not present

## 2019-01-28 NOTE — Therapy (Signed)
[place], [name], [address] Phone: [phone]   Fax:  [phone]  Occupational Therapy Treatment  Patient Details  Name: Sabrina Deleon MRN: [id number] Date of Birth: 12-06-1950 Referring Provider (OT): Dr. Jeannie Fend   Encounter Date: 01/28/2019  OT End of Session - 01/28/19 1343    Visit Number  10    Number of Visits  16    Date for OT Re-Evaluation  02/19/19    Authorization Type  workers compensation 12 visits authorized    Authorization - Visit Number  10    Authorization - Number of Visits  12    OT Start Time  1302    OT Stop Time  1345    OT Time Calculation (min)  43 min    Activity Tolerance  Patient tolerated treatment well    Behavior During Therapy  [facility] for tasks assessed/performed       Past Medical History:  Diagnosis Date  . Anxiety   . Aortic stenosis   . Arthritis   . Chronic kidney disease    Dr. Tami Ribas- noted increase kidney function levels- took pt off Metformin and started on Onglyza for Blood sugar control-pt being followed for this.  Marland Kitchen GERD (gastroesophageal reflux disease)   . Gout   . Heart murmur   . Hypertension   . Hypothyroidism   . Morbid obesity ([place]) 03/29/2018   Patient has elevated BMI with diabetes hyperlipidemia and hypertension  . Pancreatitis   . Type 2 diabetes mellitus ([place])   . Wears glasses   . Wears partial dentures    Upper    Past Surgical History:  Procedure Laterality Date  . BACK SURGERY     Lumbar  . CARPAL TUNNEL RELEASE  11/02/2011   Procedure: CARPAL TUNNEL RELEASE;  Surgeon: Tennis Must, MD;  Location: [place];  Service: Orthopedics;  Laterality: Right;  . CARPAL TUNNEL RELEASE Left 01/14/2015   Procedure: LEFT CARPAL TUNNEL RELEASE;  Surgeon: Leanora Cover, MD;  Location: [place];  Service: Orthopedics;  Laterality: Left;  . CHOLECYSTECTOMY     laparoscopic  . COLONOSCOPY    . COLONOSCOPY N/A  11/12/2015   Procedure: COLONOSCOPY;  Surgeon: Rogene Houston, MD;  [facility];  Service: Endoscopy;  Laterality: N/A;  9:30  . ESOPHAGOGASTRODUODENOSCOPY N/A 11/12/2015   Procedure: ESOPHAGOGASTRODUODENOSCOPY (EGD);  Surgeon: Rogene Houston, MD;  [facility];  Service: Endoscopy;  Laterality: N/A;  . REVERSE SHOULDER ARTHROPLASTY Right 11/04/2018  . REVERSE SHOULDER ARTHROPLASTY Right 11/04/2018   Procedure: REVERSE SHOULDER ARTHROPLASTY;  Surgeon: Verner Mould, MD;  Location: [place];  Service: Orthopedics;  Laterality: Right;  . RIGHT/LEFT HEART CATH AND CORONARY ANGIOGRAPHY N/A 10/18/2017   Procedure: RIGHT/LEFT HEART CATH AND CORONARY ANGIOGRAPHY;  Surgeon: Burnell Blanks, MD;  Location: [facility] [place] [facility];  Service: Cardiovascular;  Laterality: N/A;  . TOTAL HIP ARTHROPLASTY Left 03/22/2016   Procedure: LEFT TOTAL HIP ARTHROPLASTY ANTERIOR APPROACH;  Surgeon: Gaynelle Arabian, MD;  [facility];  Service: Orthopedics;  Laterality: Left;    There were no vitals filed for this visit.  Subjective Assessment - 01/28/19 1302    Subjective   S: The doctor said I was doing good.    Currently in Pain?  No/denies         [facility] OT Assessment - 01/28/19 1302      Assessment   Medical Diagnosis  S/P Right Reverse Total Shoulder Replacement      Precautions   Precautions  Shoulder    Type of Shoulder Precautions  P/AA/A/ROM with limitations of 130 flexion and 40 external rotation, no strengthening               OT Treatments/Exercises (OP) - 01/28/19 1305      Exercises   Exercises  Shoulder      Shoulder Exercises: Supine   Protraction  PROM;5 reps;AAROM;15 reps    Horizontal ABduction  PROM;5 reps;AAROM;15 reps    External Rotation  PROM;5 reps;AAROM;15 reps    Internal Rotation  PROM;5 reps;AAROM;15 reps    Flexion  PROM;5 reps;AAROM;15 reps    ABduction  PROM;5 reps;AAROM;15 reps      Shoulder Exercises: Seated   Protraction   AAROM;12 reps    Horizontal ABduction  AAROM;12 reps    External Rotation  AAROM;12 reps    Internal Rotation  AAROM;12 reps    Flexion  AAROM;12 reps    Abduction  AAROM;12 reps      Shoulder Exercises: ROM/Strengthening   Proximal Shoulder Strengthening, Supine  10X each, no rest breaks      Modalities   Modalities  Ultrasound      Ultrasound   Ultrasound Location  anterior shoulder along medial and lateral border of incision    Ultrasound Parameters  1.5 W/cm2, 5 minutes    Ultrasound Goals  Pain      Manual Therapy   Manual Therapy  Myofascial release    Manual therapy comments  manual therapy completed seperately from all other interventions    Myofascial Release  myofascial release and manual stretching to right upper arm, scapular, and trapezius to decrease fascial and scar restrictions and improve pain free mobility and strength.                 OT Short Term Goals - 12/31/18 1648      OT SHORT TERM GOAL #1   Title  Patient will be educated and independent with HEP for right arm mobility and strength.    Time  4    Period  Weeks    Status  On-going    Target Date  01/22/19      OT SHORT TERM GOAL #2   Title  Patient will improve right shoulder P/ROM to [facility] in order to don shirts and pants with increased independence.    Time  4    Period  Weeks    Status  On-going      OT SHORT TERM GOAL #3   Title  Patient will increase right shoulder strength to 3+/5 for increased ability to lift laundry baskets, her dogs, and items at work.    Time  4    Period  Weeks    Status  On-going      OT SHORT TERM GOAL #4   Title  Patient will decrease pain in right shoulder to 5/10 or less during completing ADL tasks.    Time  4    Period  Weeks    Status  On-going      OT SHORT TERM GOAL #5   Title  Patient will decrease fascial and scar restrictions to moderate in right upper arm for increased mobility and less pain.    Time  4    Period  Weeks    Status  On-going         OT Long Term Goals - 12/31/18 [id number]  OT LONG TERM GOAL #1   Title  Patient will return to prior level of independence and use of right arm as dominant with all B/IADLs, work, and leisure tasks.    Time  8    Period  Weeks    Status  On-going      OT LONG TERM GOAL #2   Title  Patient will increase A/ROM to [facility] in right shoulder for improved ability to comb her hair, tuck shirts in, and reach into overhead cabinets.    Time  8    Period  Weeks    Status  On-going      OT LONG TERM GOAL #3   Title  Patient will increase right shoulder strength to 4/5 or better for improved ability to lift boxes of drinks at work.    Time  8    Period  Weeks    Status  On-going      OT LONG TERM GOAL #4   Title  Patient will decrease right shoulder pain to 2/10 or better for improved ability to complete daily tasks.    Time  8    Period  Weeks    Status  On-going      OT LONG TERM GOAL #5   Title  Patient will decrease fascial restrictions in right shoulder region to minimal for improved ability to complete daily tasks with less pain.    Time  8    Period  Weeks    Status  On-going            Plan - 01/28/19 1330    Clinical Impression Statement  A: Pt reports MD is pleased with her progress and she can return to work next week and continue with therapy. Continued with [place] and manual techniques at beginnig of session, pt tender at [facility] joint with max fascial restrictions. Increased AA/ROM repetitions to 15 today. Added proximal shoulder strengthening in supine. Verbal cuing for form and technique.    Body Structure / Function / Physical Skills  ADL;Muscle spasms;UE functional use;Fascial restriction;Pain;Strength;ROM    Plan  P: Continue with [place] and manual techniques, add wall wash       Patient will benefit from skilled therapeutic intervention in order to improve the following deficits and impairments:   Body Structure / Function / Physical Skills: ADL, Muscle spasms, UE  functional use, Fascial restriction, Pain, Strength, ROM       Visit Diagnosis: Acute pain of right shoulder  Other symptoms and signs involving the musculoskeletal system  Stiffness of right shoulder, not elsewhere classified    Problem List Patient Active Problem List   Diagnosis Date Noted  . Closed fracture of right proximal humerus 11/04/2018  . Primary osteoarthritis involving multiple joints 09/12/2018  . Morbid obesity ([place]) 03/29/2018  . Severe aortic stenosis   . Iron deficiency anemia 10/13/2017  . Anxiety and depression 04/28/2017  . Encounter for long-term opiate analgesic use 04/28/2017  . Psychophysiological insomnia 04/28/2017  . OA (osteoarthritis) of hip 03/22/2016  . Chronic right hip pain 10/29/2015  . Chronic kidney disease (CKD) stage G3b/A1, moderately decreased glomerular filtration rate (GFR) between 30-44 mL/min/1.73 square meter and albuminuria creatinine ratio less than 30 mg/g 06/03/2015  . Type 2 diabetes mellitus ([place]) 11/09/2014  . Diastolic dysfunction, left ventricle 05/16/2013  . Calcific aortic stenosis 05/16/2013  . Gout 05/12/2013  . Diabetic retinopathy ([place]) 12/17/2012  . Hypertension 06/10/2012  . Hyperlipidemia 06/10/2012  . Hypothyroidism 06/10/2012   Magda Paganini  Elvina Mattes, OTR/L  [phone] 01/28/2019, 2:32 PM  [facility] [address] [place], [name], [address] Phone: [phone]   Fax:  [phone]  Name: Sabrina Deleon MRN: [id number] Date of Birth: 1950/03/31

## 2019-01-29 ENCOUNTER — Telehealth (HOSPITAL_COMMUNITY): Payer: Self-pay | Admitting: Occupational Therapy

## 2019-01-29 NOTE — Telephone Encounter (Signed)
l/m for the [place] requesting more visits and requested she call me back concerning this claim.

## 2019-01-30 ENCOUNTER — Encounter (HOSPITAL_COMMUNITY): Payer: Medicare Other

## 2019-01-30 ENCOUNTER — Other Ambulatory Visit: Payer: Self-pay

## 2019-01-30 ENCOUNTER — Encounter (HOSPITAL_COMMUNITY): Payer: Self-pay | Admitting: Occupational Therapy

## 2019-01-30 ENCOUNTER — Ambulatory Visit (HOSPITAL_COMMUNITY): Payer: Worker's Compensation | Admitting: Occupational Therapy

## 2019-01-30 DIAGNOSIS — R29898 Other symptoms and signs involving the musculoskeletal system: Secondary | ICD-10-CM

## 2019-01-30 DIAGNOSIS — M25611 Stiffness of right shoulder, not elsewhere classified: Secondary | ICD-10-CM | POA: Diagnosis not present

## 2019-01-30 DIAGNOSIS — M25511 Pain in right shoulder: Secondary | ICD-10-CM

## 2019-01-30 NOTE — Therapy (Signed)
[place], [name], [address] Phone: [phone]   Fax:  [phone]  Occupational Therapy Treatment  Patient Details  Name: Sabrina Deleon MRN: [id number] Date of Birth: 02/23/1951 Referring Provider (OT): Dr. Jeannie Fend   Encounter Date: 01/30/2019  OT End of Session - 01/30/19 1426    Visit Number  11    Number of Visits  16    Date for OT Re-Evaluation  02/19/19    Authorization Type  workers compensation 12 visits authorized    Authorization - Visit Number  11    Authorization - Number of Visits  12    OT Start Time  [id number]    OT Stop Time  1427    OT Time Calculation (min)  45 min    Activity Tolerance  Patient tolerated treatment well    Behavior During Therapy  [facility] for tasks assessed/performed       Past Medical History:  Diagnosis Date  . Anxiety   . Aortic stenosis   . Arthritis   . Chronic kidney disease    Dr. Tami Ribas- noted increase kidney function levels- took pt off Metformin and started on Onglyza for Blood sugar control-pt being followed for this.  Marland Kitchen GERD (gastroesophageal reflux disease)   . Gout   . Heart murmur   . Hypertension   . Hypothyroidism   . Morbid obesity ([place]) 03/29/2018   Patient has elevated BMI with diabetes hyperlipidemia and hypertension  . Pancreatitis   . Type 2 diabetes mellitus ([place])   . Wears glasses   . Wears partial dentures    Upper    Past Surgical History:  Procedure Laterality Date  . BACK SURGERY     Lumbar  . CARPAL TUNNEL RELEASE  11/02/2011   Procedure: CARPAL TUNNEL RELEASE;  Surgeon: Tennis Must, MD;  Location: [place];  Service: Orthopedics;  Laterality: Right;  . CARPAL TUNNEL RELEASE Left 01/14/2015   Procedure: LEFT CARPAL TUNNEL RELEASE;  Surgeon: Leanora Cover, MD;  Location: [place];  Service: Orthopedics;  Laterality: Left;  . CHOLECYSTECTOMY     laparoscopic  . COLONOSCOPY    . COLONOSCOPY N/A  11/12/2015   Procedure: COLONOSCOPY;  Surgeon: Rogene Houston, MD;  [facility];  Service: Endoscopy;  Laterality: N/A;  9:30  . ESOPHAGOGASTRODUODENOSCOPY N/A 11/12/2015   Procedure: ESOPHAGOGASTRODUODENOSCOPY (EGD);  Surgeon: Rogene Houston, MD;  [facility];  Service: Endoscopy;  Laterality: N/A;  . REVERSE SHOULDER ARTHROPLASTY Right 11/04/2018  . REVERSE SHOULDER ARTHROPLASTY Right 11/04/2018   Procedure: REVERSE SHOULDER ARTHROPLASTY;  Surgeon: Verner Mould, MD;  Location: [place];  Service: Orthopedics;  Laterality: Right;  . RIGHT/LEFT HEART CATH AND CORONARY ANGIOGRAPHY N/A 10/18/2017   Procedure: RIGHT/LEFT HEART CATH AND CORONARY ANGIOGRAPHY;  Surgeon: Burnell Blanks, MD;  Location: [place] [facility];  Service: Cardiovascular;  Laterality: N/A;  . TOTAL HIP ARTHROPLASTY Left 03/22/2016   Procedure: LEFT TOTAL HIP ARTHROPLASTY ANTERIOR APPROACH;  Surgeon: Gaynelle Arabian, MD;  [facility];  Service: Orthopedics;  Laterality: Left;    There were no vitals filed for this visit.  Subjective Assessment - 01/30/19 1339    Subjective   S: It's sore when I move it.    Currently in Pain?  Yes    Pain Score  2     Pain Location  Shoulder    Pain Orientation  Right    Pain  Descriptors / Indicators  Aching;Sore    Pain Type  Acute pain    Pain Radiating Towards  N/A    Pain Onset  1 to 4 weeks ago    Pain Frequency  Constant    Aggravating Factors   unsure    Pain Relieving Factors  heat    Effect of Pain on Daily Activities  mod effect    Multiple Pain Sites  No         [facility] OT Assessment - 01/30/19 1339      Assessment   Medical Diagnosis  S/P Right Reverse Total Shoulder Replacement      Precautions   Precautions  Shoulder    Type of Shoulder Precautions  P/AA/A/ROM with limitations of 130 flexion and 40 external rotation, no strengthening               OT Treatments/Exercises (OP) - 01/30/19 1345      Exercises    Exercises  Shoulder      Shoulder Exercises: Supine   Protraction  PROM;5 reps;AAROM;15 reps    Horizontal ABduction  PROM;5 reps;AAROM;15 reps    External Rotation  PROM;5 reps;AAROM;15 reps    Internal Rotation  PROM;5 reps;AAROM;15 reps    Flexion  PROM;5 reps;AAROM;15 reps    ABduction  PROM;5 reps;AAROM;15 reps      Shoulder Exercises: Standing   Protraction  AAROM;12 reps    Horizontal ABduction  AAROM;12 reps    External Rotation  AAROM;12 reps    Internal Rotation  AAROM;12 reps    Flexion  AAROM;12 reps    ABduction  AAROM;12 reps      Shoulder Exercises: ROM/Strengthening   Wall Wash  1'      Modalities   Modalities  Ultrasound      Ultrasound   Ultrasound Location  anterior shoulder along medial and lateral incision    Ultrasound Parameters  1.5 W/cm2, 5 minutes    Ultrasound Goals  Pain      Manual Therapy   Manual Therapy  Myofascial release    Manual therapy comments  manual therapy completed seperately from all other interventions    Myofascial Release  myofascial release and manual stretching to right upper arm, scapular, and trapezius to decrease fascial and scar restrictions and improve pain free mobility and strength.                 OT Short Term Goals - 12/31/18 1648      OT SHORT TERM GOAL #1   Title  Patient will be educated and independent with HEP for right arm mobility and strength.    Time  4    Period  Weeks    Status  On-going    Target Date  01/22/19      OT SHORT TERM GOAL #2   Title  Patient will improve right shoulder P/ROM to [facility] in order to don shirts and pants with increased independence.    Time  4    Period  Weeks    Status  On-going      OT SHORT TERM GOAL #3   Title  Patient will increase right shoulder strength to 3+/5 for increased ability to lift laundry baskets, her dogs, and items at work.    Time  4    Period  Weeks    Status  On-going      OT SHORT TERM GOAL #4   Title  Patient will decrease pain in right  shoulder  to 5/10 or less during completing ADL tasks.    Time  4    Period  Weeks    Status  On-going      OT SHORT TERM GOAL #5   Title  Patient will decrease fascial and scar restrictions to moderate in right upper arm for increased mobility and less pain.    Time  4    Period  Weeks    Status  On-going        OT Long Term Goals - 12/31/18 1648      OT LONG TERM GOAL #1   Title  Patient will return to prior level of independence and use of right arm as dominant with all B/IADLs, work, and leisure tasks.    Time  8    Period  Weeks    Status  On-going      OT LONG TERM GOAL #2   Title  Patient will increase A/ROM to [facility] in right shoulder for improved ability to comb her hair, tuck shirts in, and reach into overhead cabinets.    Time  8    Period  Weeks    Status  On-going      OT LONG TERM GOAL #3   Title  Patient will increase right shoulder strength to 4/5 or better for improved ability to lift boxes of drinks at work.    Time  8    Period  Weeks    Status  On-going      OT LONG TERM GOAL #4   Title  Patient will decrease right shoulder pain to 2/10 or better for improved ability to complete daily tasks.    Time  8    Period  Weeks    Status  On-going      OT LONG TERM GOAL #5   Title  Patient will decrease fascial restrictions in right shoulder region to minimal for improved ability to complete daily tasks with less pain.    Time  8    Period  Weeks    Status  On-going            Plan - 01/30/19 1422    Clinical Impression Statement  A: Pt reports she still cannot use RUE for hygiene tasks. Continued with [place] and manual therapy first, improvement in anterior shoulder region noted today. Continued with AA/ROM supine and seated, added wall wash this session. Verbal cuing for form and technique.    Body Structure / Function / Physical Skills  ADL;Muscle spasms;UE functional use;Fascial restriction;Pain;Strength;ROM    Plan  P: Continue with [place] and manual  techniques, add proximal shoulder strengthening in standing       Patient will benefit from skilled therapeutic intervention in order to improve the following deficits and impairments:   Body Structure / Function / Physical Skills: ADL, Muscle spasms, UE functional use, Fascial restriction, Pain, Strength, ROM       Visit Diagnosis: Acute pain of right shoulder  Other symptoms and signs involving the musculoskeletal system  Stiffness of right shoulder, not elsewhere classified    Problem List Patient Active Problem List   Diagnosis Date Noted  . Closed fracture of right proximal humerus 11/04/2018  . Primary osteoarthritis involving multiple joints 09/12/2018  . Morbid obesity ([place]) 03/29/2018  . Severe aortic stenosis   . Iron deficiency anemia 10/13/2017  . Anxiety and depression 04/28/2017  . Encounter for long-term opiate analgesic use 04/28/2017  . Psychophysiological insomnia 04/28/2017  . OA (osteoarthritis) of  hip 03/22/2016  . Chronic right hip pain 10/29/2015  . Chronic kidney disease (CKD) stage G3b/A1, moderately decreased glomerular filtration rate (GFR) between 30-44 mL/min/1.73 square meter and albuminuria creatinine ratio less than 30 mg/g 06/03/2015  . Type 2 diabetes mellitus ([place]) 11/09/2014  . Diastolic dysfunction, left ventricle 05/16/2013  . Calcific aortic stenosis 05/16/2013  . Gout 05/12/2013  . Diabetic retinopathy ([place]) 12/17/2012  . Hypertension 06/10/2012  . Hyperlipidemia 06/10/2012  . Hypothyroidism 06/10/2012   Guadelupe Sabin, OTR/L  [phone] 01/30/2019, 2:27 PM  [facility] [address] [place], [name], [address] Phone: [phone]   Fax:  [phone]  Name: Sabrina Deleon MRN: [id number] Date of Birth: [birth year]

## 2019-01-31 ENCOUNTER — Encounter (HOSPITAL_COMMUNITY): Payer: Medicare Other | Admitting: Specialist

## 2019-02-03 ENCOUNTER — Encounter (HOSPITAL_COMMUNITY): Payer: Medicare Other | Admitting: Physical Therapy

## 2019-02-03 ENCOUNTER — Telehealth: Payer: Self-pay | Admitting: Family Medicine

## 2019-02-03 NOTE — Telephone Encounter (Signed)
Patient needs a refill of HYDROcodone-acetaminophen (NORCO/VICODIN) 5-325 MG tablet sent to [facility] on [address].

## 2019-02-04 ENCOUNTER — Other Ambulatory Visit: Payer: Self-pay | Admitting: Family Medicine

## 2019-02-04 ENCOUNTER — Ambulatory Visit (HOSPITAL_COMMUNITY): Payer: Worker's Compensation | Admitting: Occupational Therapy

## 2019-02-04 ENCOUNTER — Other Ambulatory Visit: Payer: Self-pay

## 2019-02-04 ENCOUNTER — Telehealth (HOSPITAL_COMMUNITY): Payer: Self-pay | Admitting: Occupational Therapy

## 2019-02-04 ENCOUNTER — Encounter (HOSPITAL_COMMUNITY): Payer: Self-pay | Admitting: Occupational Therapy

## 2019-02-04 DIAGNOSIS — M25511 Pain in right shoulder: Secondary | ICD-10-CM

## 2019-02-04 DIAGNOSIS — M25611 Stiffness of right shoulder, not elsewhere classified: Secondary | ICD-10-CM | POA: Diagnosis not present

## 2019-02-04 DIAGNOSIS — R29898 Other symptoms and signs involving the musculoskeletal system: Secondary | ICD-10-CM

## 2019-02-04 MED ORDER — HYDROCODONE-ACETAMINOPHEN 5-325 MG PO TABS: ORAL_TABLET | ORAL | 0 refills | Status: DC

## 2019-02-04 NOTE — Telephone Encounter (Signed)
Please advise. Thank you

## 2019-02-04 NOTE — Telephone Encounter (Signed)
Called new case manager Kristen Dillard [phone]#[phone]. She did not know anything about this workers comp  case she transfered a note to [place] -gave my number and said Juliann Pulse would call me back.

## 2019-02-04 NOTE — Telephone Encounter (Signed)
Patient checking back because she is out of pain medication and had to start back to work yesterday and is in pain. Recently had an appt with Hoyle Sauer in October.

## 2019-02-04 NOTE — Telephone Encounter (Signed)
Fax [phone] request visits to be approved to continue care for this patient.

## 2019-02-04 NOTE — Telephone Encounter (Signed)
Prescription prescription was sent in as requested

## 2019-02-04 NOTE — Therapy (Signed)
[place], [name], [address] Phone: [phone]   Fax:  [phone]  Occupational Therapy Treatment  Patient Details  Name: Sabrina Deleon MRN: [id number] Date of Birth: [birth year] Referring Provider (OT): Dr. Jeannie Fend   Encounter Date: 02/04/2019  OT End of Session - 02/04/19 1203    Visit Number  12    Number of Visits  16    Date for OT Re-Evaluation  02/19/19    Authorization Type  workers compensation 12 visits authorized    Authorization - Visit Number  12    Authorization - Number of Visits  12    OT Start Time  1115    OT Stop Time  1158    OT Time Calculation (min)  43 min    Activity Tolerance  Patient tolerated treatment well    Behavior During Therapy  [facility] for tasks assessed/performed       Past Medical History:  Diagnosis Date  . Anxiety   . Aortic stenosis   . Arthritis   . Chronic kidney disease    Dr. Tami Ribas- noted increase kidney function levels- took pt off Metformin and started on Onglyza for Blood sugar control-pt being followed for this.  Marland Kitchen GERD (gastroesophageal reflux disease)   . Gout   . Heart murmur   . Hypertension   . Hypothyroidism   . Morbid obesity ([place]) 03/29/2018   Patient has elevated BMI with diabetes hyperlipidemia and hypertension  . Pancreatitis   . Type 2 diabetes mellitus ([place])   . Wears glasses   . Wears partial dentures    Upper    Past Surgical History:  Procedure Laterality Date  . BACK SURGERY     Lumbar  . CARPAL TUNNEL RELEASE  11/02/2011   Procedure: CARPAL TUNNEL RELEASE;  Surgeon: Tennis Must, MD;  Location: [place];  Service: Orthopedics;  Laterality: Right;  . CARPAL TUNNEL RELEASE Left 01/14/2015   Procedure: LEFT CARPAL TUNNEL RELEASE;  Surgeon: Leanora Cover, MD;  Location: [place];  Service: Orthopedics;  Laterality: Left;  . CHOLECYSTECTOMY     laparoscopic  . COLONOSCOPY    . COLONOSCOPY N/A  11/12/2015   Procedure: COLONOSCOPY;  Surgeon: Rogene Houston, MD;  [facility];  Service: Endoscopy;  Laterality: N/A;  9:30  . ESOPHAGOGASTRODUODENOSCOPY N/A 11/12/2015   Procedure: ESOPHAGOGASTRODUODENOSCOPY (EGD);  Surgeon: Rogene Houston, MD;  [facility];  Service: Endoscopy;  Laterality: N/A;  . REVERSE SHOULDER ARTHROPLASTY Right 11/04/2018  . REVERSE SHOULDER ARTHROPLASTY Right 11/04/2018   Procedure: REVERSE SHOULDER ARTHROPLASTY;  Surgeon: Verner Mould, MD;  Location: [place];  Service: Orthopedics;  Laterality: Right;  . RIGHT/LEFT HEART CATH AND CORONARY ANGIOGRAPHY N/A 10/18/2017   Procedure: RIGHT/LEFT HEART CATH AND CORONARY ANGIOGRAPHY;  Surgeon: Burnell Blanks, MD;  Location: [place] [facility];  Service: Cardiovascular;  Laterality: N/A;  . TOTAL HIP ARTHROPLASTY Left 03/22/2016   Procedure: LEFT TOTAL HIP ARTHROPLASTY ANTERIOR APPROACH;  Surgeon: Gaynelle Arabian, MD;  [facility];  Service: Orthopedics;  Laterality: Left;    There were no vitals filed for this visit.  Subjective Assessment - 02/04/19 1117    Subjective   S: I went back to work yesterday and it hurt all day.    Currently in Pain?  Yes    Pain Score  3     Pain Location  Shoulder    Pain Orientation  Right    Pain Descriptors / Indicators  Aching;Sore    Pain Type  Acute pain    Pain Radiating Towards  N/A    Pain Onset  Yesterday    Pain Frequency  Constant    Aggravating Factors   typing at work    Pain Relieving Factors  heat    Effect of Pain on Daily Activities  mod effect    Multiple Pain Sites  No         [facility] OT Assessment - 02/04/19 1117      Assessment   Medical Diagnosis  S/P Right Reverse Total Shoulder Replacement      Precautions   Precautions  Shoulder    Type of Shoulder Precautions  P/AA/A/ROM with limitations of 130 flexion and 40 external rotation, no strengthening               OT Treatments/Exercises (OP) - 02/04/19  1118      Exercises   Exercises  Shoulder      Shoulder Exercises: Supine   Protraction  PROM;5 reps;AROM;12 reps    Horizontal ABduction  PROM;5 reps;AROM;12 reps    External Rotation  PROM;5 reps;AROM;12 reps    Internal Rotation  PROM;5 reps;AROM;12 reps    Flexion  PROM;5 reps;AAROM;15 reps    ABduction  PROM;5 reps;AROM;12 reps      Shoulder Exercises: Standing   Protraction  AROM;12 reps    Horizontal ABduction  AROM;12 reps    External Rotation  AROM;12 reps    Internal Rotation  AROM;12 reps    Flexion  AAROM;12 reps    ABduction  AROM;12 reps      Shoulder Exercises: ROM/Strengthening   Proximal Shoulder Strengthening, Supine  10X each, no rest breaks      Modalities   Modalities  Ultrasound      Ultrasound   Ultrasound Location  anterior shoulder along lateral and medial incision    Ultrasound Parameters  1.5W/cm2; 5 minutes    Ultrasound Goals  Pain      Manual Therapy   Manual Therapy  Myofascial release    Manual therapy comments  manual therapy completed seperately from all other interventions    Myofascial Release  myofascial release and manual stretching to right upper arm, scapular, and trapezius to decrease fascial and scar restrictions and improve pain free mobility and strength.                 OT Short Term Goals - 12/31/18 1648      OT SHORT TERM GOAL #1   Title  Patient will be educated and independent with HEP for right arm mobility and strength.    Time  4    Period  Weeks    Status  On-going    Target Date  01/22/19      OT SHORT TERM GOAL #2   Title  Patient will improve right shoulder P/ROM to [facility] in order to don shirts and pants with increased independence.    Time  4    Period  Weeks    Status  On-going      OT SHORT TERM GOAL #3   Title  Patient will increase right shoulder strength to 3+/5 for increased ability to lift laundry baskets, her dogs, and items at work.    Time  4    Period  Weeks    Status  On-going       OT SHORT TERM GOAL #4   Title  Patient will decrease pain in right shoulder to 5/10 or less during completing ADL tasks.    Time  4    Period  Weeks    Status  On-going      OT SHORT TERM GOAL #5   Title  Patient will decrease fascial and scar restrictions to moderate in right upper arm for increased mobility and less pain.    Time  4    Period  Weeks    Status  On-going        OT Long Term Goals - 12/31/18 1648      OT LONG TERM GOAL #1   Title  Patient will return to prior level of independence and use of right arm as dominant with all B/IADLs, work, and leisure tasks.    Time  8    Period  Weeks    Status  On-going      OT LONG TERM GOAL #2   Title  Patient will increase A/ROM to [facility] in right shoulder for improved ability to comb her hair, tuck shirts in, and reach into overhead cabinets.    Time  8    Period  Weeks    Status  On-going      OT LONG TERM GOAL #3   Title  Patient will increase right shoulder strength to 4/5 or better for improved ability to lift boxes of drinks at work.    Time  8    Period  Weeks    Status  On-going      OT LONG TERM GOAL #4   Title  Patient will decrease right shoulder pain to 2/10 or better for improved ability to complete daily tasks.    Time  8    Period  Weeks    Status  On-going      OT LONG TERM GOAL #5   Title  Patient will decrease fascial restrictions in right shoulder region to minimal for improved ability to complete daily tasks with less pain.    Time  8    Period  Weeks    Status  On-going            Plan - 02/04/19 1151    Clinical Impression Statement  A: Pt reports soreness during workday yesterday, discussed positioning options for improved comfort. Continued with [place] and manual techniques to address fascial and scar restrictions. Progressed to A/ROM with exception of flexion, continued with flexion AA/ROM to reach end range stretch. Added proximal shoulder strengthening in standing. Verbal cuing for form and  technique.    Body Structure / Function / Physical Skills  ADL;Muscle spasms;UE functional use;Fascial restriction;Pain;Strength;ROM    Plan  P: Follow up on additional WC visit approval. Continue with A/ROM and scapular theraband, update HEP for A/ROM       Patient will benefit from skilled therapeutic intervention in order to improve the following deficits and impairments:   Body Structure / Function / Physical Skills: ADL, Muscle spasms, UE functional use, Fascial restriction, Pain, Strength, ROM       Visit Diagnosis: Acute pain of right shoulder  Other symptoms and signs involving the musculoskeletal system  Stiffness of right shoulder, not elsewhere classified    Problem List Patient Active Problem List   Diagnosis Date Noted  . Closed fracture of right proximal humerus 11/04/2018  . Primary osteoarthritis involving multiple joints 09/12/2018  . Morbid obesity ([place]) 03/29/2018  . Severe aortic stenosis   . Iron deficiency anemia 10/13/2017  .  Anxiety and depression 04/28/2017  . Encounter for long-term opiate analgesic use 04/28/2017  . Psychophysiological insomnia 04/28/2017  . OA (osteoarthritis) of hip 03/22/2016  . Chronic right hip pain 10/29/2015  . Chronic kidney disease (CKD) stage G3b/A1, moderately decreased glomerular filtration rate (GFR) between 30-44 mL/min/1.73 square meter and albuminuria creatinine ratio less than 30 mg/g 06/03/2015  . Type 2 diabetes mellitus ([place]) 11/09/2014  . Diastolic dysfunction, left ventricle 05/16/2013  . Calcific aortic stenosis 05/16/2013  . Gout 05/12/2013  . Diabetic retinopathy ([place]) 12/17/2012  . Hypertension 06/10/2012  . Hyperlipidemia 06/10/2012  . Hypothyroidism 06/10/2012   Guadelupe Sabin, OTR/L  [phone] 02/04/2019, 12:04 PM  [facility] [address] [place], [name], [address] Phone: [phone]   Fax:  [phone]  Name: Sabrina Deleon MRN:  [id number] Date of Birth: October 17, 1950

## 2019-02-04 NOTE — Telephone Encounter (Signed)
Pt is aware.  

## 2019-02-05 ENCOUNTER — Encounter (HOSPITAL_COMMUNITY): Payer: Medicare Other

## 2019-02-06 ENCOUNTER — Encounter (HOSPITAL_COMMUNITY): Payer: Self-pay

## 2019-02-06 ENCOUNTER — Other Ambulatory Visit: Payer: Self-pay

## 2019-02-06 ENCOUNTER — Ambulatory Visit (HOSPITAL_COMMUNITY): Payer: Worker's Compensation

## 2019-02-06 DIAGNOSIS — R29898 Other symptoms and signs involving the musculoskeletal system: Secondary | ICD-10-CM

## 2019-02-06 DIAGNOSIS — M25511 Pain in right shoulder: Secondary | ICD-10-CM

## 2019-02-06 DIAGNOSIS — M25611 Stiffness of right shoulder, not elsewhere classified: Secondary | ICD-10-CM

## 2019-02-06 NOTE — Patient Instructions (Signed)
Repeat all exercises 10-15 times, 1-2 times per day.  1) Shoulder Protraction    Begin with elbows by your side, slowly "punch" straight out in front of you.      2) Shoulder Flexion  Standing:         Begin with arms at your side with thumbs pointed up, slowly raise both arms up and forward towards overhead.      3) Horizontal abduction/adduction  Standing:           Begin with arms straight out in front of you, bring out to the side in at "T" shape. Keep arms straight entire time.        4) Internal & External Rotation    *No band* -Stand with elbows at the side and elbows bent 90 degrees. Move your forearms away from your body, then bring back inward toward the body.     5) Shoulder Abduction  Standing:       begin with your arms flat next to your side. Slowly move your arms out to the side so that they go overhead, in a jumping jack or snow angel movement.

## 2019-02-06 NOTE — Therapy (Signed)
[place], [name], [address] Phone: [phone]   Fax:  [phone]  Occupational Therapy Treatment  Patient Details  Name: Sabrina Deleon MRN: [id number] Date of Birth: 01-May-1950 Referring Provider (OT): Dr. Jeannie Fend   Encounter Date: 02/06/2019  OT End of Session - 02/06/19 1043    Visit Number  13    Number of Visits  16    Date for OT Re-Evaluation  02/19/19    Authorization Type  workers compensation 12 additional visits authorized    Authorization - Visit Number  1    Authorization - Number of Visits  12    OT Start Time  0818    OT Stop Time  0856    OT Time Calculation (min)  38 min    Activity Tolerance  Patient tolerated treatment well    Behavior During Therapy  [facility] for tasks assessed/performed       Past Medical History:  Diagnosis Date  . Anxiety   . Aortic stenosis   . Arthritis   . Chronic kidney disease    Dr. Tami Ribas- noted increase kidney function levels- took pt off Metformin and started on Onglyza for Blood sugar control-pt being followed for this.  Marland Kitchen GERD (gastroesophageal reflux disease)   . Gout   . Heart murmur   . Hypertension   . Hypothyroidism   . Morbid obesity ([place]) 03/29/2018   Patient has elevated BMI with diabetes hyperlipidemia and hypertension  . Pancreatitis   . Type 2 diabetes mellitus ([place])   . Wears glasses   . Wears partial dentures    Upper    Past Surgical History:  Procedure Laterality Date  . BACK SURGERY     Lumbar  . CARPAL TUNNEL RELEASE  11/02/2011   Procedure: CARPAL TUNNEL RELEASE;  Surgeon: Tennis Must, MD;  Location: [place];  Service: Orthopedics;  Laterality: Right;  . CARPAL TUNNEL RELEASE Left 01/14/2015   Procedure: LEFT CARPAL TUNNEL RELEASE;  Surgeon: Leanora Cover, MD;  Location: [place];  Service: Orthopedics;  Laterality: Left;  . CHOLECYSTECTOMY     laparoscopic  . COLONOSCOPY    .  COLONOSCOPY N/A 11/12/2015   Procedure: COLONOSCOPY;  Surgeon: Rogene Houston, MD;  [facility];  Service: Endoscopy;  Laterality: N/A;  9:30  . ESOPHAGOGASTRODUODENOSCOPY N/A 11/12/2015   Procedure: ESOPHAGOGASTRODUODENOSCOPY (EGD);  Surgeon: Rogene Houston, MD;  [facility];  Service: Endoscopy;  Laterality: N/A;  . REVERSE SHOULDER ARTHROPLASTY Right 11/04/2018  . REVERSE SHOULDER ARTHROPLASTY Right 11/04/2018   Procedure: REVERSE SHOULDER ARTHROPLASTY;  Surgeon: Verner Mould, MD;  Location: [place];  Service: Orthopedics;  Laterality: Right;  . RIGHT/LEFT HEART CATH AND CORONARY ANGIOGRAPHY N/A 10/18/2017   Procedure: RIGHT/LEFT HEART CATH AND CORONARY ANGIOGRAPHY;  Surgeon: Burnell Blanks, MD;  Location: [place] [facility];  Service: Cardiovascular;  Laterality: N/A;  . TOTAL HIP ARTHROPLASTY Left 03/22/2016   Procedure: LEFT TOTAL HIP ARTHROPLASTY ANTERIOR APPROACH;  Surgeon: Gaynelle Arabian, MD;  [facility];  Service: Orthopedics;  Laterality: Left;    There were no vitals filed for this visit.  Subjective Assessment - 02/06/19 0847    Subjective   S: I fell out of my recliner 2 days ago when I was trying to get up to go to the bathroom.    Currently in Pain?  Yes    Pain Score  3  Pain Location  Shoulder    Pain Orientation  Right         [facility] OT Assessment - 02/06/19 0848      Assessment   Medical Diagnosis  S/P Right Reverse Total Shoulder Replacement      Precautions   Precautions  Shoulder    Type of Shoulder Precautions  P/AA/A/ROM with limitations of 130 flexion and 40 external rotation, no strengthening               OT Treatments/Exercises (OP) - 02/06/19 0848      Exercises   Exercises  Shoulder      Shoulder Exercises: Supine   Protraction  PROM;5 reps    Horizontal ABduction  PROM;5 reps    External Rotation  PROM;5 reps    Internal Rotation  PROM;5 reps    Flexion  PROM;5 reps    ABduction  PROM;5  reps      Shoulder Exercises: Standing   Protraction  AROM;12 reps    Horizontal ABduction  AROM;12 reps    External Rotation  AROM;12 reps    Internal Rotation  AROM;12 reps    Flexion  AROM;12 reps    ABduction  AROM;12 reps    Row  Theraband;12 reps    Theraband Level (Shoulder Row)  Level 2 (Red)      Shoulder Exercises: ROM/Strengthening   Proximal Shoulder Strengthening, Seated  12X A/ROM no rest breaks      Manual Therapy   Manual Therapy  Myofascial release    Manual therapy comments  manual therapy completed seperately from all other interventions    Myofascial Release  myofascial release and manual stretching to right upper arm, scapular, and trapezius to decrease fascial and scar restrictions and improve pain free mobility and strength.               OT Education - 02/06/19 0850    Education Details  A/ROM shoulder exercises. Patient may stop AA/ROM HEP    Person(s) Educated  Patient    Methods  Explanation;Demonstration;Handout;Verbal cues    Comprehension  Verbalized understanding;Returned demonstration       OT Short Term Goals - 12/31/18 1648      OT SHORT TERM GOAL #1   Title  Patient will be educated and independent with HEP for right arm mobility and strength.    Time  4    Period  Weeks    Status  On-going    Target Date  01/22/19      OT SHORT TERM GOAL #2   Title  Patient will improve right shoulder P/ROM to [facility] in order to don shirts and pants with increased independence.    Time  4    Period  Weeks    Status  On-going      OT SHORT TERM GOAL #3   Title  Patient will increase right shoulder strength to 3+/5 for increased ability to lift laundry baskets, her dogs, and items at work.    Time  4    Period  Weeks    Status  On-going      OT SHORT TERM GOAL #4   Title  Patient will decrease pain in right shoulder to 5/10 or less during completing ADL tasks.    Time  4    Period  Weeks    Status  On-going      OT SHORT TERM GOAL #5    Title  Patient will decrease fascial and scar restrictions  to moderate in right upper arm for increased mobility and less pain.    Time  4    Period  Weeks    Status  On-going        OT Long Term Goals - 12/31/18 1648      OT LONG TERM GOAL #1   Title  Patient will return to prior level of independence and use of right arm as dominant with all B/IADLs, work, and leisure tasks.    Time  8    Period  Weeks    Status  On-going      OT LONG TERM GOAL #2   Title  Patient will increase A/ROM to [facility] in right shoulder for improved ability to comb her hair, tuck shirts in, and reach into overhead cabinets.    Time  8    Period  Weeks    Status  On-going      OT LONG TERM GOAL #3   Title  Patient will increase right shoulder strength to 4/5 or better for improved ability to lift boxes of drinks at work.    Time  8    Period  Weeks    Status  On-going      OT LONG TERM GOAL #4   Title  Patient will decrease right shoulder pain to 2/10 or better for improved ability to complete daily tasks.    Time  8    Period  Weeks    Status  On-going      OT LONG TERM GOAL #5   Title  Patient will decrease fascial restrictions in right shoulder region to minimal for improved ability to complete daily tasks with less pain.    Time  8    Period  Weeks    Status  On-going            Plan - 02/06/19 1043    Clinical Impression Statement  A: Patient presented with moderate fascial restrictions in her right upper arm. Manual techniques completed to address. VC for form and technique. Updated HEP to include A/ROM. Patient demonstrated understanding with good form. Discussed body positioning when at work and seated at her desk. Handout provided for patient to review and assess her workstation set up.    Body Structure / Function / Physical Skills  ADL;Muscle spasms;UE functional use;Fascial restriction;Pain;Strength;ROM    Plan  P: Add retraction and extension with red band for scapular  strengthening.       Patient will benefit from skilled therapeutic intervention in order to improve the following deficits and impairments:   Body Structure / Function / Physical Skills: ADL, Muscle spasms, UE functional use, Fascial restriction, Pain, Strength, ROM       Visit Diagnosis: Stiffness of right shoulder, not elsewhere classified  Other symptoms and signs involving the musculoskeletal system  Acute pain of right shoulder    Problem List Patient Active Problem List   Diagnosis Date Noted  . Closed fracture of right proximal humerus 11/04/2018  . Primary osteoarthritis involving multiple joints 09/12/2018  . Morbid obesity ([place]) 03/29/2018  . Severe aortic stenosis   . Iron deficiency anemia 10/13/2017  . Anxiety and depression 04/28/2017  . Encounter for long-term opiate analgesic use 04/28/2017  . Psychophysiological insomnia 04/28/2017  . OA (osteoarthritis) of hip 03/22/2016  . Chronic right hip pain 10/29/2015  . Chronic kidney disease (CKD) stage G3b/A1, moderately decreased glomerular filtration rate (GFR) between 30-44 mL/min/1.73 square meter and albuminuria creatinine ratio less than 30  mg/g 06/03/2015  . Type 2 diabetes mellitus ([place]) 11/09/2014  . Diastolic dysfunction, left ventricle 05/16/2013  . Calcific aortic stenosis 05/16/2013  . Gout 05/12/2013  . Diabetic retinopathy ([place]) 12/17/2012  . Hypertension 06/10/2012  . Hyperlipidemia 06/10/2012  . Hypothyroidism 06/10/2012   Ailene Ravel, OTR/L,CBIS  [phone]  02/06/2019, 11:08 AM  [facility] [address] [place], [name], [address] Phone: [phone]   Fax:  [phone]  Name: BRECKLYN MAUZY MRN: [id number] Date of Birth: May 23, 1950

## 2019-02-10 ENCOUNTER — Other Ambulatory Visit: Payer: Self-pay

## 2019-02-10 ENCOUNTER — Ambulatory Visit (HOSPITAL_COMMUNITY): Payer: Worker's Compensation

## 2019-02-10 ENCOUNTER — Encounter (HOSPITAL_COMMUNITY): Payer: Self-pay

## 2019-02-10 DIAGNOSIS — M25611 Stiffness of right shoulder, not elsewhere classified: Secondary | ICD-10-CM | POA: Diagnosis not present

## 2019-02-10 DIAGNOSIS — R29898 Other symptoms and signs involving the musculoskeletal system: Secondary | ICD-10-CM

## 2019-02-10 DIAGNOSIS — M25511 Pain in right shoulder: Secondary | ICD-10-CM

## 2019-02-10 NOTE — Therapy (Signed)
[place], [name], [address] Phone: [phone]   Fax:  [phone]  Occupational Therapy Treatment  Patient Details  Name: Sabrina Deleon MRN: [id number] Date of Birth: Oct 15, 1950 Referring Provider (OT): Dr. Jeannie Fend   Encounter Date: 02/10/2019  OT End of Session - 02/10/19 1417    Visit Number  14    Number of Visits  16    Date for OT Re-Evaluation  02/19/19    Authorization Type  workers compensation 12 additional visits authorized    Authorization - Visit Number  2    Authorization - Number of Visits  12    OT Start Time  [id number]    OT Stop Time  1341    OT Time Calculation (min)  38 min    Activity Tolerance  Patient tolerated treatment well    Behavior During Therapy  [facility] for tasks assessed/performed       Past Medical History:  Diagnosis Date  . Anxiety   . Aortic stenosis   . Arthritis   . Chronic kidney disease    Dr. Tami Ribas- noted increase kidney function levels- took pt off Metformin and started on Onglyza for Blood sugar control-pt being followed for this.  Marland Kitchen GERD (gastroesophageal reflux disease)   . Gout   . Heart murmur   . Hypertension   . Hypothyroidism   . Morbid obesity ([place]) 03/29/2018   Patient has elevated BMI with diabetes hyperlipidemia and hypertension  . Pancreatitis   . Type 2 diabetes mellitus ([place])   . Wears glasses   . Wears partial dentures    Upper    Past Surgical History:  Procedure Laterality Date  . BACK SURGERY     Lumbar  . CARPAL TUNNEL RELEASE  11/02/2011   Procedure: CARPAL TUNNEL RELEASE;  Surgeon: Tennis Must, MD;  Location: [place];  Service: Orthopedics;  Laterality: Right;  . CARPAL TUNNEL RELEASE Left 01/14/2015   Procedure: LEFT CARPAL TUNNEL RELEASE;  Surgeon: Leanora Cover, MD;  Location: [place];  Service: Orthopedics;  Laterality: Left;  . CHOLECYSTECTOMY     laparoscopic  . COLONOSCOPY    .  COLONOSCOPY N/A 11/12/2015   Procedure: COLONOSCOPY;  Surgeon: Rogene Houston, MD;  [facility];  Service: Endoscopy;  Laterality: N/A;  9:30  . ESOPHAGOGASTRODUODENOSCOPY N/A 11/12/2015   Procedure: ESOPHAGOGASTRODUODENOSCOPY (EGD);  Surgeon: Rogene Houston, MD;  [facility];  Service: Endoscopy;  Laterality: N/A;  . REVERSE SHOULDER ARTHROPLASTY Right 11/04/2018  . REVERSE SHOULDER ARTHROPLASTY Right 11/04/2018   Procedure: REVERSE SHOULDER ARTHROPLASTY;  Surgeon: Verner Mould, MD;  Location: [place];  Service: Orthopedics;  Laterality: Right;  . RIGHT/LEFT HEART CATH AND CORONARY ANGIOGRAPHY N/A 10/18/2017   Procedure: RIGHT/LEFT HEART CATH AND CORONARY ANGIOGRAPHY;  Surgeon: Burnell Blanks, MD;  Location: [place] [facility];  Service: Cardiovascular;  Laterality: N/A;  . TOTAL HIP ARTHROPLASTY Left 03/22/2016   Procedure: LEFT TOTAL HIP ARTHROPLASTY ANTERIOR APPROACH;  Surgeon: Gaynelle Arabian, MD;  [facility];  Service: Orthopedics;  Laterality: Left;    There were no vitals filed for this visit.  Subjective Assessment - 02/10/19 1324    Subjective   S: I pulled my mouse closer and my papers are on a stand closer to me and it has helped a lot.    Currently in Pain?  No/denies         [facility] OT  Assessment - 02/10/19 1325      Assessment   Medical Diagnosis  S/P Right Reverse Total Shoulder Replacement      Precautions   Precautions  Shoulder    Type of Shoulder Precautions  P/AA/A/ROM with limitations of 130 flexion and 40 external rotation, no strengthening               OT Treatments/Exercises (OP) - 02/10/19 1325      Exercises   Exercises  Shoulder      Shoulder Exercises: Supine   Protraction  PROM;5 reps;AROM;12 reps    Horizontal ABduction  PROM;5 reps;AROM;12 reps    External Rotation  PROM;5 reps    Flexion  PROM;5 reps;AROM;12 reps    ABduction  PROM;5 reps;AROM;12 reps      Shoulder Exercises: Standing    Protraction  AROM;15 reps    Horizontal ABduction  AROM;15 reps    External Rotation  AROM;15 reps    Internal Rotation  AROM;15 reps    Flexion  AROM;15 reps    ABduction  AROM;15 reps    Extension  Theraband;12 reps    Theraband Level (Shoulder Extension)  Level 2 (Red)    Row  Theraband;12 reps    Theraband Level (Shoulder Row)  Level 2 (Red)    Retraction  Theraband;12 reps    Theraband Level (Shoulder Retraction)  Level 2 (Red)      Shoulder Exercises: ROM/Strengthening   Proximal Shoulder Strengthening, Seated  15X A/ROM no rest breaks               OT Short Term Goals - 12/31/18 1648      OT SHORT TERM GOAL #1   Title  Patient will be educated and independent with HEP for right arm mobility and strength.    Time  4    Period  Weeks    Status  On-going    Target Date  01/22/19      OT SHORT TERM GOAL #2   Title  Patient will improve right shoulder P/ROM to [facility] in order to don shirts and pants with increased independence.    Time  4    Period  Weeks    Status  On-going      OT SHORT TERM GOAL #3   Title  Patient will increase right shoulder strength to 3+/5 for increased ability to lift laundry baskets, her dogs, and items at work.    Time  4    Period  Weeks    Status  On-going      OT SHORT TERM GOAL #4   Title  Patient will decrease pain in right shoulder to 5/10 or less during completing ADL tasks.    Time  4    Period  Weeks    Status  On-going      OT SHORT TERM GOAL #5   Title  Patient will decrease fascial and scar restrictions to moderate in right upper arm for increased mobility and less pain.    Time  4    Period  Weeks    Status  On-going        OT Long Term Goals - 12/31/18 1648      OT LONG TERM GOAL #1   Title  Patient will return to prior level of independence and use of right arm as dominant with all B/IADLs, work, and leisure tasks.    Time  8    Period  Weeks    Status  On-going      OT LONG TERM GOAL #2   Title  Patient  will increase A/ROM to [facility] in right shoulder for improved ability to comb her hair, tuck shirts in, and reach into overhead cabinets.    Time  8    Period  Weeks    Status  On-going      OT LONG TERM GOAL #3   Title  Patient will increase right shoulder strength to 4/5 or better for improved ability to lift boxes of drinks at work.    Time  8    Period  Weeks    Status  On-going      OT LONG TERM GOAL #4   Title  Patient will decrease right shoulder pain to 2/10 or better for improved ability to complete daily tasks.    Time  8    Period  Weeks    Status  On-going      OT LONG TERM GOAL #5   Title  Patient will decrease fascial restrictions in right shoulder region to minimal for improved ability to complete daily tasks with less pain.    Time  8    Period  Weeks    Status  On-going            Plan - 02/10/19 1427    Clinical Impression Statement  A: Patient completed A/ROM supine and standing this date. Completed retraction and extension for scapular strengthening using the red band. Appeared to overthink movement and required extensive verbal and tactile cueing for form and technique. Completed manual techniques to address fascial restrictions in the right upper arm and bicep region proximal to her healed incision.    Body Structure / Function / Physical Skills  ADL;Muscle spasms;UE functional use;Fascial restriction;Pain;Strength;ROM    Plan  P: Completed proximal shoulder strengthening using washcloth on door. Attempt overhead lacing.    Consulted and Agree with Plan of Care  Patient       Patient will benefit from skilled therapeutic intervention in order to improve the following deficits and impairments:   Body Structure / Function / Physical Skills: ADL, Muscle spasms, UE functional use, Fascial restriction, Pain, Strength, ROM       Visit Diagnosis: Stiffness of right shoulder, not elsewhere classified  Other symptoms and signs involving the musculoskeletal  system  Acute pain of right shoulder    Problem List Patient Active Problem List   Diagnosis Date Noted  . Closed fracture of right proximal humerus 11/04/2018  . Primary osteoarthritis involving multiple joints 09/12/2018  . Morbid obesity ([place]) 03/29/2018  . Severe aortic stenosis   . Iron deficiency anemia 10/13/2017  . Anxiety and depression 04/28/2017  . Encounter for long-term opiate analgesic use 04/28/2017  . Psychophysiological insomnia 04/28/2017  . OA (osteoarthritis) of hip 03/22/2016  . Chronic right hip pain 10/29/2015  . Chronic kidney disease (CKD) stage G3b/A1, moderately decreased glomerular filtration rate (GFR) between 30-44 mL/min/1.73 square meter and albuminuria creatinine ratio less than 30 mg/g 06/03/2015  . Type 2 diabetes mellitus ([place]) 11/09/2014  . Diastolic dysfunction, left ventricle 05/16/2013  . Calcific aortic stenosis 05/16/2013  . Gout 05/12/2013  . Diabetic retinopathy ([place]) 12/17/2012  . Hypertension 06/10/2012  . Hyperlipidemia 06/10/2012  . Hypothyroidism 06/10/2012   Ailene Ravel, OTR/L,CBIS  [phone]  02/10/2019, 2:31 PM  [facility] [address] [place], [name], [address] Phone: [phone]   Fax:  [phone]  Name: Sabrina Deleon MRN:  [id number] Date of Birth: [birth year]

## 2019-02-11 ENCOUNTER — Ambulatory Visit (HOSPITAL_COMMUNITY): Payer: Worker's Compensation | Admitting: Specialist

## 2019-02-11 ENCOUNTER — Telehealth (HOSPITAL_COMMUNITY): Payer: Self-pay | Admitting: Specialist

## 2019-02-11 NOTE — Telephone Encounter (Signed)
Beth has a meeting today-requested to cx today's apptment - pt agreed to come in tomorrow

## 2019-02-12 ENCOUNTER — Encounter (HOSPITAL_COMMUNITY): Payer: Self-pay | Admitting: Occupational Therapy

## 2019-02-12 ENCOUNTER — Ambulatory Visit (HOSPITAL_COMMUNITY): Payer: Worker's Compensation | Admitting: Occupational Therapy

## 2019-02-12 ENCOUNTER — Other Ambulatory Visit: Payer: Self-pay

## 2019-02-12 DIAGNOSIS — M25511 Pain in right shoulder: Secondary | ICD-10-CM

## 2019-02-12 DIAGNOSIS — M25611 Stiffness of right shoulder, not elsewhere classified: Secondary | ICD-10-CM

## 2019-02-12 DIAGNOSIS — R29898 Other symptoms and signs involving the musculoskeletal system: Secondary | ICD-10-CM

## 2019-02-12 NOTE — Therapy (Signed)
[place], [name], [address] Phone: [phone]   Fax:  [phone]  Occupational Therapy Treatment  Patient Details  Name: Sabrina Deleon MRN: [id number] Date of Birth: 1951/02/02 Referring Provider (OT): Dr. Jeannie Fend   Encounter Date: 02/12/2019  OT End of Session - 02/12/19 1122    Visit Number  15    Number of Visits  16    Date for OT Re-Evaluation  02/19/19    Authorization Type  workers compensation 12 additional visits authorized    Authorization - Visit Number  3    Authorization - Number of Visits  12    OT Start Time  [id number]    OT Stop Time  1114    OT Time Calculation (min)  39 min    Activity Tolerance  Patient tolerated treatment well    Behavior During Therapy  [facility] for tasks assessed/performed       Past Medical History:  Diagnosis Date  . Anxiety   . Aortic stenosis   . Arthritis   . Chronic kidney disease    Dr. Tami Ribas- noted increase kidney function levels- took pt off Metformin and started on Onglyza for Blood sugar control-pt being followed for this.  Marland Kitchen GERD (gastroesophageal reflux disease)   . Gout   . Heart murmur   . Hypertension   . Hypothyroidism   . Morbid obesity ([place]) 03/29/2018   Patient has elevated BMI with diabetes hyperlipidemia and hypertension  . Pancreatitis   . Type 2 diabetes mellitus ([place])   . Wears glasses   . Wears partial dentures    Upper    Past Surgical History:  Procedure Laterality Date  . BACK SURGERY     Lumbar  . CARPAL TUNNEL RELEASE  11/02/2011   Procedure: CARPAL TUNNEL RELEASE;  Surgeon: Tennis Must, MD;  Location: [place];  Service: Orthopedics;  Laterality: Right;  . CARPAL TUNNEL RELEASE Left 01/14/2015   Procedure: LEFT CARPAL TUNNEL RELEASE;  Surgeon: Leanora Cover, MD;  Location: [place];  Service: Orthopedics;  Laterality: Left;  . CHOLECYSTECTOMY     laparoscopic  . COLONOSCOPY    .  COLONOSCOPY N/A 11/12/2015   Procedure: COLONOSCOPY;  Surgeon: Rogene Houston, MD;  [facility];  Service: Endoscopy;  Laterality: N/A;  9:30  . ESOPHAGOGASTRODUODENOSCOPY N/A 11/12/2015   Procedure: ESOPHAGOGASTRODUODENOSCOPY (EGD);  Surgeon: Rogene Houston, MD;  [facility];  Service: Endoscopy;  Laterality: N/A;  . REVERSE SHOULDER ARTHROPLASTY Right 11/04/2018  . REVERSE SHOULDER ARTHROPLASTY Right 11/04/2018   Procedure: REVERSE SHOULDER ARTHROPLASTY;  Surgeon: Verner Mould, MD;  Location: [place];  Service: Orthopedics;  Laterality: Right;  . RIGHT/LEFT HEART CATH AND CORONARY ANGIOGRAPHY N/A 10/18/2017   Procedure: RIGHT/LEFT HEART CATH AND CORONARY ANGIOGRAPHY;  Surgeon: Burnell Blanks, MD;  Location: [place] [facility];  Service: Cardiovascular;  Laterality: N/A;  . TOTAL HIP ARTHROPLASTY Left 03/22/2016   Procedure: LEFT TOTAL HIP ARTHROPLASTY ANTERIOR APPROACH;  Surgeon: Gaynelle Arabian, MD;  [facility];  Service: Orthopedics;  Laterality: Left;    There were no vitals filed for this visit.  Subjective Assessment - 02/12/19 1037    Subjective   S: It's sore today.   Currently in Pain?  Yes    Pain Score  4     Pain Location  Shoulder    Pain Orientation  Right    Pain Descriptors / Indicators  Aching;Sore    Pain Type  Acute pain    Pain Radiating Towards  n/a    Pain Onset  In the past 7 days    Pain Frequency  Intermittent    Aggravating Factors   typing    Pain Relieving Factors  heat    Effect of Pain on Daily Activities  mod effect on ADLs    Multiple Pain Sites  No         [facility] OT Assessment - 02/12/19 1037      Assessment   Medical Diagnosis  S/P Right Reverse Total Shoulder Replacement      Precautions   Precautions  Shoulder    Type of Shoulder Precautions  P/AA/A/ROM with limitations of 130 flexion and 40 external rotation, no strengthening               OT Treatments/Exercises (OP) - 02/12/19 1038       Exercises   Exercises  Shoulder      Shoulder Exercises: Supine   Protraction  PROM;5 reps;AROM;12 reps    Horizontal ABduction  PROM;5 reps;AROM;12 reps    External Rotation  PROM;5 reps    Flexion  PROM;5 reps;AROM;12 reps    ABduction  PROM;5 reps;AROM;12 reps      Shoulder Exercises: Standing   Protraction  AROM;15 reps    Horizontal ABduction  AROM;15 reps    External Rotation  AROM;15 reps    Internal Rotation  AROM;15 reps    Flexion  AROM;15 reps    ABduction  AROM;15 reps    Extension  Theraband;12 reps    Theraband Level (Shoulder Extension)  Level 2 (Red)    Row  Theraband;12 reps    Theraband Level (Shoulder Row)  Level 2 (Red)    Retraction  Theraband;12 reps    Theraband Level (Shoulder Retraction)  Level 2 (Red)      Shoulder Exercises: ROM/Strengthening   Over Head Lace  1' seated    Proximal Shoulder Strengthening, Seated  15X A/ROM no rest breaks    Other ROM/Strengthening Exercises  proximal shoulder strengthening on doorway 1' flexion      Manual Therapy   Manual Therapy  Myofascial release    Manual therapy comments  manual therapy completed seperately from all other interventions    Myofascial Release  myofascial release and manual stretching to right upper arm, scapular, and trapezius to decrease fascial and scar restrictions and improve pain free mobility and strength.                 OT Short Term Goals - 12/31/18 1648      OT SHORT TERM GOAL #1   Title  Patient will be educated and independent with HEP for right arm mobility and strength.    Time  4    Period  Weeks    Status  On-going    Target Date  01/22/19      OT SHORT TERM GOAL #2   Title  Patient will improve right shoulder P/ROM to [facility] in order to don shirts and pants with increased independence.    Time  4    Period  Weeks    Status  On-going      OT SHORT TERM GOAL #3   Title  Patient will increase right shoulder strength to 3+/5 for increased ability to lift laundry  baskets, her dogs, and items at work.    Time  4    Period  Weeks  Status  On-going      OT SHORT TERM GOAL #4   Title  Patient will decrease pain in right shoulder to 5/10 or less during completing ADL tasks.    Time  4    Period  Weeks    Status  On-going      OT SHORT TERM GOAL #5   Title  Patient will decrease fascial and scar restrictions to moderate in right upper arm for increased mobility and less pain.    Time  4    Period  Weeks    Status  On-going        OT Long Term Goals - 12/31/18 1648      OT LONG TERM GOAL #1   Title  Patient will return to prior level of independence and use of right arm as dominant with all B/IADLs, work, and leisure tasks.    Time  8    Period  Weeks    Status  On-going      OT LONG TERM GOAL #2   Title  Patient will increase A/ROM to [facility] in right shoulder for improved ability to comb her hair, tuck shirts in, and reach into overhead cabinets.    Time  8    Period  Weeks    Status  On-going      OT LONG TERM GOAL #3   Title  Patient will increase right shoulder strength to 4/5 or better for improved ability to lift boxes of drinks at work.    Time  8    Period  Weeks    Status  On-going      OT LONG TERM GOAL #4   Title  Patient will decrease right shoulder pain to 2/10 or better for improved ability to complete daily tasks.    Time  8    Period  Weeks    Status  On-going      OT LONG TERM GOAL #5   Title  Patient will decrease fascial restrictions in right shoulder region to minimal for improved ability to complete daily tasks with less pain.    Time  8    Period  Weeks    Status  On-going            Plan - 02/12/19 1123    Clinical Impression Statement  A: Continued with manual techniques to address fascial restrictions in right upper arm and trapezius regions. Increased restrictions noted in trapezius region, suspect due to work tasks and increased stress. Continued with A/ROM supine and standing, added proximal  shoulder strengthening on door and overhead lacing. Improvement in form with scapular theraband, min cuing required today. Verbal cuing for form and technique with exercises.    Body Structure / Function / Physical Skills  ADL;Muscle spasms;UE functional use;Fascial restriction;Pain;Strength;ROM    Plan  P: Reassessment, FOTO, recertification       Patient will benefit from skilled therapeutic intervention in order to improve the following deficits and impairments:   Body Structure / Function / Physical Skills: ADL, Muscle spasms, UE functional use, Fascial restriction, Pain, Strength, ROM       Visit Diagnosis: Stiffness of right shoulder, not elsewhere classified  Other symptoms and signs involving the musculoskeletal system  Acute pain of right shoulder    Problem List Patient Active Problem List   Diagnosis Date Noted  . Closed fracture of right proximal humerus 11/04/2018  . Primary osteoarthritis involving multiple joints 09/12/2018  . Morbid obesity ([place]) 03/29/2018  .  Severe aortic stenosis   . Iron deficiency anemia 10/13/2017  . Anxiety and depression 04/28/2017  . Encounter for long-term opiate analgesic use 04/28/2017  . Psychophysiological insomnia 04/28/2017  . OA (osteoarthritis) of hip 03/22/2016  . Chronic right hip pain 10/29/2015  . Chronic kidney disease (CKD) stage G3b/A1, moderately decreased glomerular filtration rate (GFR) between 30-44 mL/min/1.73 square meter and albuminuria creatinine ratio less than 30 mg/g 06/03/2015  . Type 2 diabetes mellitus ([place]) 11/09/2014  . Diastolic dysfunction, left ventricle 05/16/2013  . Calcific aortic stenosis 05/16/2013  . Gout 05/12/2013  . Diabetic retinopathy ([place]) 12/17/2012  . Hypertension 06/10/2012  . Hyperlipidemia 06/10/2012  . Hypothyroidism 06/10/2012   Guadelupe Sabin, OTR/L  [phone] 02/12/2019, 11:27 AM  [facility] [address] [place], [name],  [address] Phone: [phone]   Fax:  [phone]  Name: Sabrina Deleon MRN: [id number] Date of Birth: [birth year]

## 2019-02-18 ENCOUNTER — Other Ambulatory Visit: Payer: Self-pay

## 2019-02-18 ENCOUNTER — Ambulatory Visit (HOSPITAL_COMMUNITY): Payer: Worker's Compensation | Attending: Family Medicine

## 2019-02-18 DIAGNOSIS — R29898 Other symptoms and signs involving the musculoskeletal system: Secondary | ICD-10-CM | POA: Diagnosis present

## 2019-02-18 DIAGNOSIS — M25511 Pain in right shoulder: Secondary | ICD-10-CM | POA: Diagnosis not present

## 2019-02-18 DIAGNOSIS — M25611 Stiffness of right shoulder, not elsewhere classified: Secondary | ICD-10-CM | POA: Diagnosis present

## 2019-02-19 NOTE — Therapy (Signed)
[facility] [address] [place], [name], [address] Phone: [phone]   Fax:  [phone]  Occupational Therapy Treatment  Patient Details  Name: Sabrina Deleon MRN: [id number] Date of Birth: Oct 14, 1950 Referring Provider (OT): Dr. Jeannie Fend   Progress Note Reporting Period 01/21/2019 to 02/18/2019  See note below for Objective Data and Assessment of Progress/Goals.       Encounter Date: 02/18/2019  OT End of Session - 02/18/19 1725    Visit Number  16    Number of Visits  24    Date for OT Re-Evaluation  03/18/19    Authorization Type  workers compensation 12 additional visits authorized    Authorization - Visit Number  3    Authorization - Number of Visits  12    OT Start Time  4431   reassessment   OT Stop Time  1728    OT Time Calculation (min)  41 min    Activity Tolerance  Patient tolerated treatment well    Behavior During Therapy  WFL for tasks assessed/performed       Past Medical History:  Diagnosis Date  . Anxiety   . Aortic stenosis   . Arthritis   . Chronic kidney disease    Dr. Tami Ribas- noted increase kidney function levels- took pt off Metformin and started on Onglyza for Blood sugar control-pt being followed for this.  Marland Kitchen GERD (gastroesophageal reflux disease)   . Gout   . Heart murmur   . Hypertension   . Hypothyroidism   . Morbid obesity ([place]) 03/29/2018   Patient has elevated BMI with diabetes hyperlipidemia and hypertension  . Pancreatitis   . Type 2 diabetes mellitus ([place])   . Wears glasses   . Wears partial dentures    Upper    Past Surgical History:  Procedure Laterality Date  . BACK SURGERY     Lumbar  . CARPAL TUNNEL RELEASE  11/02/2011   Procedure: CARPAL TUNNEL RELEASE;  Surgeon: Tennis Must, MD;  Location: [facility];  Service: Orthopedics;  Laterality: Right;  . CARPAL TUNNEL RELEASE Left 01/14/2015   Procedure: LEFT CARPAL TUNNEL RELEASE;  Surgeon: Leanora Cover,  MD;  Location: [facility];  Service: Orthopedics;  Laterality: Left;  . CHOLECYSTECTOMY     laparoscopic  . COLONOSCOPY    . COLONOSCOPY N/A 11/12/2015   Procedure: COLONOSCOPY;  Surgeon: Rogene Houston, MD;  [facility];  Service: Endoscopy;  Laterality: N/A;  9:30  . ESOPHAGOGASTRODUODENOSCOPY N/A 11/12/2015   Procedure: ESOPHAGOGASTRODUODENOSCOPY (EGD);  Surgeon: Rogene Houston, MD;  [facility];  Service: Endoscopy;  Laterality: N/A;  . REVERSE SHOULDER ARTHROPLASTY Right 11/04/2018  . REVERSE SHOULDER ARTHROPLASTY Right 11/04/2018   Procedure: REVERSE SHOULDER ARTHROPLASTY;  Surgeon: Verner Mould, MD;  Location: [facility];  Service: Orthopedics;  Laterality: Right;  . RIGHT/LEFT HEART CATH AND CORONARY ANGIOGRAPHY N/A 10/18/2017   Procedure: RIGHT/LEFT HEART CATH AND CORONARY ANGIOGRAPHY;  Surgeon: Burnell Blanks, MD;  Location: [facility];  Service: Cardiovascular;  Laterality: N/A;  . TOTAL HIP ARTHROPLASTY Left 03/22/2016   Procedure: LEFT TOTAL HIP ARTHROPLASTY ANTERIOR APPROACH;  Surgeon: Gaynelle Arabian, MD;  [facility];  Service: Orthopedics;  Laterality: Left;    There were no vitals filed for this visit.  Subjective Assessment - 02/19/19 1058    Subjective   S: This spot that I fell on is still sore.    Currently  in Pain?  Yes    Pain Score  5     Pain Location  Shoulder    Pain Orientation  Right    Pain Descriptors / Indicators  Aching;Sore    Pain Type  Acute pain    Pain Radiating Towards  N/A    Pain Onset  1 to 4 weeks ago    Pain Frequency  Intermittent    Aggravating Factors   Arm position at work when typing (90-90-90 is used).    Pain Relieving Factors  heat, extending her elbow    Effect of Pain on Daily Activities  mod effect    Multiple Pain Sites  No         [facility] OT Assessment - 02/18/19 1652      Assessment   Medical Diagnosis  S/P Right Reverse Total Shoulder Replacement    Referring  Provider (OT)  Dr. Jeannie Fend      Precautions   Precautions  Shoulder    Type of Shoulder Precautions  P/AA/A/ROM with limitations of 130 flexion and 40 external rotation, no strengthening      Prior Function   Level of Independence  Independent      Observation/Other Assessments   Focus on Therapeutic Outcomes (FOTO)   50/100      ROM / Strength   AROM / PROM / Strength  AROM;PROM;Strength      Palpation   Palpation comment  min fascial restrictions in right upper arm and shoulder region      AROM   Overall AROM Comments  assessed in seated, external and internal rotation with arm adducted    AROM Assessment Site  Shoulder    Right/Left Shoulder  Right    Right Shoulder Flexion  120 Degrees   previous: 104   Right Shoulder ABduction  124 Degrees   previous: 103   Right Shoulder Internal Rotation  90 Degrees   previous: same   Right Shoulder External Rotation  35 Degrees   previous: 35     PROM   Overall PROM Comments  assessed in supine, external and internal rotation with shoulder adducted    PROM Assessment Site  Shoulder    Right/Left Shoulder  Right    Right Shoulder Flexion  132 Degrees   previous: 121   Right Shoulder ABduction  124 Degrees   previous: 115   Right Shoulder Internal Rotation  90 Degrees   previous: same   Right Shoulder External Rotation  25 Degrees   preivous: 27     Strength   Overall Strength Comments  assessed seated, er/IR adducted    Strength Assessment Site  Shoulder    Right/Left Shoulder  Right    Right Shoulder Flexion  3+/5   previous: 3/5   Right Shoulder ABduction  4+/5   previous: 3/5   Right Shoulder Internal Rotation  3+/5   previous: same   Right Shoulder External Rotation  3/5   previous: 3-/5              OT Treatments/Exercises (OP) - 02/19/19 1059      Exercises   Exercises  Shoulder      Shoulder Exercises: Supine   Protraction  PROM;5 reps    Horizontal ABduction  PROM;5 reps    External Rotation   PROM;5 reps    Internal Rotation  PROM;5 reps    Flexion  PROM;5 reps    ABduction  PROM;5 reps      Manual Therapy  Manual Therapy  Myofascial release    Manual therapy comments  manual therapy completed seperately from all other interventions    Myofascial Release  myofascial release and manual stretching to right upper arm, scapular, and trapezius to decrease fascial and scar restrictions and improve pain free mobility and strength.               OT Education - 02/19/19 1103    Education Details  Reviewed progress in therapy and therapy goals. Discussed continuing therapy services for 4 more weeks.    Person(s) Educated  Patient    Methods  Explanation    Comprehension  Verbalized understanding       OT Short Term Goals - 02/18/19 1721      OT SHORT TERM GOAL #1   Title  Patient will be educated and independent with HEP for right arm mobility and strength.    Time  4    Period  Weeks    Status  Achieved    Target Date  01/22/19      OT SHORT TERM GOAL #2   Title  Patient will improve right shoulder P/ROM to [facility] in order to don shirts and pants with increased independence.    Time  4    Period  Weeks    Status  Achieved      OT SHORT TERM GOAL #3   Title  Patient will increase right shoulder strength to 3+/5 for increased ability to lift laundry baskets, her dogs, and items at work.    Time  4    Period  Weeks    Status  Achieved      OT SHORT TERM GOAL #4   Title  Patient will decrease pain in right shoulder to 5/10 or less during completing ADL tasks.    Time  4    Period  Weeks    Status  Achieved      OT SHORT TERM GOAL #5   Title  Patient will decrease fascial and scar restrictions to moderate in right upper arm for increased mobility and less pain.    Time  4    Period  Weeks    Status  Achieved        OT Long Term Goals - 02/18/19 1724      OT LONG TERM GOAL #1   Title  Patient will return to prior level of independence and use of right arm  as dominant with all B/IADLs, work, and leisure tasks.    Time  8    Period  Weeks    Status  On-going      OT LONG TERM GOAL #2   Title  Patient will increase A/ROM to [facility] in right shoulder for improved ability to comb her hair, tuck shirts in, and reach into overhead cabinets.    Time  8    Period  Weeks    Status  On-going      OT LONG TERM GOAL #3   Title  Patient will increase right shoulder strength to 4/5 or better for improved ability to lift boxes of drinks at work.    Time  8    Period  Weeks    Status  On-going      OT LONG TERM GOAL #4   Title  Patient will decrease right shoulder pain to 2/10 or better for improved ability to complete daily tasks.    Time  8    Period  Weeks  Status  On-going      OT LONG TERM GOAL #5   Title  Patient will decrease fascial restrictions in right shoulder region to minimal for improved ability to complete daily tasks with less pain.    Time  8    Period  Weeks    Status  On-going            Plan - 02/19/19 1103    Clinical Impression Statement  A: Reassessment completed this date. patient is progressing in therapy while meeting all short term goals. No long term goals have been met at this time. Patient demonstrates improvements with ROM, strength, and decreased fascial restrictions and pain. 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 Even though she has changed her sitting positioning, she is experiencing some soreness from typing. Continues to have deficits with ROM and strength and will benefit from OT services to focus on mentioned deficits.    Body Structure / Function / Physical Skills  ADL;Muscle spasms;UE functional use;Fascial restriction;Pain;Strength;ROM    Plan  P: Continue OT services 2 times a week for 4 more weeks to focus on mentioned deficits. Next session: COntinue with manual techniques and passive stretching. Attempt supine strengthening with 1# weight.     Consulted and Agree with Plan of Care  Patient       Patient will benefit from skilled therapeutic intervention in order to improve the following deficits and impairments:   Body Structure / Function / Physical Skills: ADL, Muscle spasms, UE functional use, Fascial restriction, Pain, Strength, ROM       Visit Diagnosis: Acute pain of right shoulder - Plan: Ot plan of care cert/re-cert  Other symptoms and signs involving the musculoskeletal system - Plan: Ot plan of care cert/re-cert  Stiffness of right shoulder, not elsewhere classified - Plan: Ot plan of care cert/re-cert    Problem List Patient Active Problem List   Diagnosis Date Noted  . Closed fracture of right proximal humerus 11/04/2018  . Primary osteoarthritis involving multiple joints 09/12/2018  . Morbid obesity ([place]) 03/29/2018  . Severe aortic stenosis   . Iron deficiency anemia 10/13/2017  . Anxiety and depression 04/28/2017  . Encounter for long-term opiate analgesic use 04/28/2017  . Psychophysiological insomnia 04/28/2017  . OA (osteoarthritis) of hip 03/22/2016  . Chronic right hip pain 10/29/2015  . Chronic kidney disease (CKD) stage G3b/A1, moderately decreased glomerular filtration rate (GFR) between 30-44 mL/min/1.73 square meter and albuminuria creatinine ratio less than 30 mg/g 06/03/2015  . Type 2 diabetes mellitus ([place]) 11/09/2014  . Diastolic dysfunction, left ventricle 05/16/2013  . Calcific aortic stenosis 05/16/2013  . Gout 05/12/2013  . Diabetic retinopathy ([place]) 12/17/2012  . Hypertension 06/10/2012  . Hyperlipidemia 06/10/2012  . Hypothyroidism 06/10/2012   Ailene Ravel, OTR/L,CBIS  [phone]  02/19/2019, 11:12 AM  [facility] [address] [place], [name], [address] Phone: [phone]   Fax:  [phone]  Name: Sabrina Deleon MRN: [id number] Date of Birth: 12-Oct-1950

## 2019-02-21 ENCOUNTER — Other Ambulatory Visit: Payer: Self-pay

## 2019-02-21 ENCOUNTER — Encounter (HOSPITAL_COMMUNITY): Payer: Self-pay

## 2019-02-21 ENCOUNTER — Ambulatory Visit (HOSPITAL_COMMUNITY): Payer: Worker's Compensation

## 2019-02-21 DIAGNOSIS — R29898 Other symptoms and signs involving the musculoskeletal system: Secondary | ICD-10-CM

## 2019-02-21 DIAGNOSIS — M25511 Pain in right shoulder: Secondary | ICD-10-CM

## 2019-02-21 DIAGNOSIS — M25611 Stiffness of right shoulder, not elsewhere classified: Secondary | ICD-10-CM

## 2019-02-21 NOTE — Therapy (Signed)
[facility] [place], [name], [address] Phone: [phone]   Fax:  [phone]  Occupational Therapy Treatment  Patient Details  Name: Sabrina Deleon MRN: [id number] Date of Birth: [birth year] Referring Provider (OT): Dr. Jeannie Fend   Encounter Date: 02/21/2019  OT End of Session - 02/21/19 0929    Visit Number  17    Number of Visits  24    Date for OT Re-Evaluation  03/18/19    Authorization Type  workers compensation 12 additional visits authorized    Authorization - Visit Number  4    Authorization - Number of Visits  12    OT Start Time  0900    OT Stop Time  0939    OT Time Calculation (min)  39 min    Activity Tolerance  Patient tolerated treatment well    Behavior During Therapy  [facility] for tasks assessed/performed       Past Medical History:  Diagnosis Date  . Anxiety   . Aortic stenosis   . Arthritis   . Chronic kidney disease    Dr. Tami Ribas- noted increase kidney function levels- took pt off Metformin and started on Onglyza for Blood sugar control-pt being followed for this.  Marland Kitchen GERD (gastroesophageal reflux disease)   . Gout   . Heart murmur   . Hypertension   . Hypothyroidism   . Morbid obesity ([place]) 03/29/2018   Patient has elevated BMI with diabetes hyperlipidemia and hypertension  . Pancreatitis   . Type 2 diabetes mellitus ([place])   . Wears glasses   . Wears partial dentures    Upper    Past Surgical History:  Procedure Laterality Date  . BACK SURGERY     Lumbar  . CARPAL TUNNEL RELEASE  11/02/2011   Procedure: CARPAL TUNNEL RELEASE;  Surgeon: Tennis Must, MD;  Location: [facility];  Service: Orthopedics;  Laterality: Right;  . CARPAL TUNNEL RELEASE Left 01/14/2015   Procedure: LEFT CARPAL TUNNEL RELEASE;  Surgeon: Leanora Cover, MD;  Location: [facility];  Service: Orthopedics;  Laterality: Left;  . CHOLECYSTECTOMY     laparoscopic  . COLONOSCOPY    .  COLONOSCOPY N/A 11/12/2015   Procedure: COLONOSCOPY;  Surgeon: Rogene Houston, MD;  [facility];  Service: Endoscopy;  Laterality: N/A;  9:30  . ESOPHAGOGASTRODUODENOSCOPY N/A 11/12/2015   Procedure: ESOPHAGOGASTRODUODENOSCOPY (EGD);  Surgeon: Rogene Houston, MD;  [facility];  Service: Endoscopy;  Laterality: N/A;  . REVERSE SHOULDER ARTHROPLASTY Right 11/04/2018  . REVERSE SHOULDER ARTHROPLASTY Right 11/04/2018   Procedure: REVERSE SHOULDER ARTHROPLASTY;  Surgeon: Verner Mould, MD;  Location: [facility];  Service: Orthopedics;  Laterality: Right;  . RIGHT/LEFT HEART CATH AND CORONARY ANGIOGRAPHY N/A 10/18/2017   Procedure: RIGHT/LEFT HEART CATH AND CORONARY ANGIOGRAPHY;  Surgeon: Burnell Blanks, MD;  Location: [facility];  Service: Cardiovascular;  Laterality: N/A;  . TOTAL HIP ARTHROPLASTY Left 03/22/2016   Procedure: LEFT TOTAL HIP ARTHROPLASTY ANTERIOR APPROACH;  Surgeon: Gaynelle Arabian, MD;  [facility];  Service: Orthopedics;  Laterality: Left;    There were no vitals filed for this visit.  Subjective Assessment - 02/21/19 0929    Subjective   S: It feels nothing like it did the other day. It only hurts if I move it.    Currently in Pain?  No/denies         [facility] OT Assessment - 02/21/19 [id number]  Assessment   Medical Diagnosis  S/P Right Reverse Total Shoulder Replacement      Precautions   Precautions  Shoulder    Type of Shoulder Precautions  P/AA/A/ROM with limitations of 130 flexion and 40 external rotation, no strengthening               OT Treatments/Exercises (OP) - 02/21/19 0925      Exercises   Exercises  Shoulder      Shoulder Exercises: Supine   Protraction  PROM;5 reps;Strengthening;10 reps    Protraction Weight (lbs)  1    Horizontal ABduction  PROM;5 reps;Strengthening;10 reps    Horizontal ABduction Weight (lbs)  1    External Rotation  PROM;5 reps;Strengthening;10 reps    External Rotation Weight  (lbs)  1    Internal Rotation  PROM;5 reps;Strengthening;10 reps    Internal Rotation Weight (lbs)  1    Flexion  PROM;5 reps;Strengthening;10 reps    Shoulder Flexion Weight (lbs)  1    ABduction  PROM;5 reps;Strengthening;10 reps    Shoulder ABduction Weight (lbs)  1      Shoulder Exercises: Standing   Protraction  Strengthening;10 reps    Protraction Weight (lbs)  1    Horizontal ABduction  Strengthening;10 reps    Horizontal ABduction Weight (lbs)  1    External Rotation  Strengthening;10 reps    External Rotation Weight (lbs)  1    Internal Rotation  Strengthening;10 reps    Internal Rotation Weight (lbs)  1    Flexion  Strengthening;10 reps    Shoulder Flexion Weight (lbs)  1    ABduction  Strengthening;10 reps    Shoulder ABduction Weight (lbs)  1      Shoulder Exercises: ROM/Strengthening   Over Head Lace  2' seated    Proximal Shoulder Strengthening, Supine  10X with 1# no rest breaks                OT Short Term Goals - 02/21/19 0945      OT SHORT TERM GOAL #1   Title  Patient will be educated and independent with HEP for right arm mobility and strength.    Time  4    Period  Weeks    Target Date  01/22/19      OT SHORT TERM GOAL #2   Title  Patient will improve right shoulder P/ROM to [facility] in order to don shirts and pants with increased independence.    Time  4    Period  Weeks      OT SHORT TERM GOAL #3   Title  Patient will increase right shoulder strength to 3+/5 for increased ability to lift laundry baskets, her dogs, and items at work.    Time  4    Period  Weeks      OT SHORT TERM GOAL #4   Title  Patient will decrease pain in right shoulder to 5/10 or less during completing ADL tasks.    Time  4    Period  Weeks      OT SHORT TERM GOAL #5   Title  Patient will decrease fascial and scar restrictions to moderate in right upper arm for increased mobility and less pain.    Time  4    Period  Weeks        OT Long Term Goals - 02/18/19  1724      OT LONG TERM GOAL #1   Title  Patient will return to  prior level of independence and use of right arm as dominant with all B/IADLs, work, and leisure tasks.    Time  8    Period  Weeks    Status  On-going      OT LONG TERM GOAL #2   Title  Patient will increase A/ROM to [facility] in right shoulder for improved ability to comb her hair, tuck shirts in, and reach into overhead cabinets.    Time  8    Period  Weeks    Status  On-going      OT LONG TERM GOAL #3   Title  Patient will increase right shoulder strength to 4/5 or better for improved ability to lift boxes of drinks at work.    Time  8    Period  Weeks    Status  On-going      OT LONG TERM GOAL #4   Title  Patient will decrease right shoulder pain to 2/10 or better for improved ability to complete daily tasks.    Time  8    Period  Weeks    Status  On-going      OT LONG TERM GOAL #5   Title  Patient will decrease fascial restrictions in right shoulder region to minimal for improved ability to complete daily tasks with less pain.    Time  8    Period  Weeks    Status  On-going            Plan - 02/21/19 [id number]    Clinical Impression Statement  A: Progressed to strengthening with 1# weights supine and standing. Patient required VC for form and technique. Manual techniques completed to address fascial restrictions. Pt presents with min fascial restrctions proximal to healed incision primarily.    Body Structure / Function / Physical Skills  ADL;Muscle spasms;UE functional use;Fascial restriction;Pain;Strength;ROM    Plan  P: Continue with shouder strengthening. Complete functional reaching task.    Consulted and Agree with Plan of Care  Patient       Patient will benefit from skilled therapeutic intervention in order to improve the following deficits and impairments:   Body Structure / Function / Physical Skills: ADL, Muscle spasms, UE functional use, Fascial restriction, Pain, Strength, ROM       Visit  Diagnosis: Other symptoms and signs involving the musculoskeletal system  Stiffness of right shoulder, not elsewhere classified  Acute pain of right shoulder    Problem List Patient Active Problem List   Diagnosis Date Noted  . Closed fracture of right proximal humerus 11/04/2018  . Primary osteoarthritis involving multiple joints 09/12/2018  . Morbid obesity ([place]) 03/29/2018  . Severe aortic stenosis   . Iron deficiency anemia 10/13/2017  . Anxiety and depression 04/28/2017  . Encounter for long-term opiate analgesic use 04/28/2017  . Psychophysiological insomnia 04/28/2017  . OA (osteoarthritis) of hip 03/22/2016  . Chronic right hip pain 10/29/2015  . Chronic kidney disease (CKD) stage G3b/A1, moderately decreased glomerular filtration rate (GFR) between 30-44 mL/min/1.73 square meter and albuminuria creatinine ratio less than 30 mg/g 06/03/2015  . Type 2 diabetes mellitus ([place]) 11/09/2014  . Diastolic dysfunction, left ventricle 05/16/2013  . Calcific aortic stenosis 05/16/2013  . Gout 05/12/2013  . Diabetic retinopathy ([place]) 12/17/2012  . Hypertension 06/10/2012  . Hyperlipidemia 06/10/2012  . Hypothyroidism 06/10/2012   Sabrina Deleon, Sabrina Deleon,Sabrina Deleon  [phone]  02/21/2019, 9:46 AM  [facility] [place], [name], [address] Phone: [phone]  Fax:  [phone]  Name: Sabrina Deleon MRN: [id number] Date of Birth: [birth year]

## 2019-02-23 ENCOUNTER — Other Ambulatory Visit: Payer: Self-pay | Admitting: Family Medicine

## 2019-02-26 ENCOUNTER — Other Ambulatory Visit: Payer: Self-pay

## 2019-02-26 ENCOUNTER — Encounter (HOSPITAL_COMMUNITY): Payer: Self-pay

## 2019-02-26 ENCOUNTER — Ambulatory Visit (HOSPITAL_COMMUNITY): Payer: Worker's Compensation | Attending: Family Medicine

## 2019-02-26 DIAGNOSIS — M25611 Stiffness of right shoulder, not elsewhere classified: Secondary | ICD-10-CM | POA: Diagnosis present

## 2019-02-26 DIAGNOSIS — R29898 Other symptoms and signs involving the musculoskeletal system: Secondary | ICD-10-CM | POA: Diagnosis present

## 2019-02-26 DIAGNOSIS — M25511 Pain in right shoulder: Secondary | ICD-10-CM | POA: Insufficient documentation

## 2019-02-26 NOTE — Therapy (Signed)
[place], [name], [address] Phone: [phone]   Fax:  [phone]  Occupational Therapy Treatment  Patient Details  Name: Sabrina Deleon MRN: [id number] Date of Birth: 03/24/1950 Referring Provider (OT): Dr. Jeannie Fend   Encounter Date: 02/26/2019  OT End of Session - 02/26/19 1506    Visit Number  18    Number of Visits  24    Date for OT Re-Evaluation  03/18/19    Authorization Type  workers compensation 12 additional visits authorized    Authorization - Visit Number  5    Authorization - Number of Visits  12    OT Start Time  [phone]    OT Stop Time  0940    OT Time Calculation (min)  35 min    Activity Tolerance  Patient tolerated treatment well    Behavior During Therapy  [facility] for tasks assessed/performed       Past Medical History:  Diagnosis Date  . Anxiety   . Aortic stenosis   . Arthritis   . Chronic kidney disease    Dr. Tami Ribas- noted increase kidney function levels- took pt off Metformin and started on Onglyza for Blood sugar control-pt being followed for this.  Marland Kitchen GERD (gastroesophageal reflux disease)   . Gout   . Heart murmur   . Hypertension   . Hypothyroidism   . Morbid obesity ([place]) 03/29/2018   Patient has elevated BMI with diabetes hyperlipidemia and hypertension  . Pancreatitis   . Type 2 diabetes mellitus ([place])   . Wears glasses   . Wears partial dentures    Upper    Past Surgical History:  Procedure Laterality Date  . BACK SURGERY     Lumbar  . CARPAL TUNNEL RELEASE  11/02/2011   Procedure: CARPAL TUNNEL RELEASE;  Surgeon: Tennis Must, MD;  Location: [place];  Service: Orthopedics;  Laterality: Right;  . CARPAL TUNNEL RELEASE Left 01/14/2015   Procedure: LEFT CARPAL TUNNEL RELEASE;  Surgeon: Leanora Cover, MD;  Location: [place];  Service: Orthopedics;  Laterality: Left;  . CHOLECYSTECTOMY     laparoscopic  . COLONOSCOPY    .  COLONOSCOPY N/A 11/12/2015   Procedure: COLONOSCOPY;  Surgeon: Rogene Houston, MD;  [facility];  Service: Endoscopy;  Laterality: N/A;  9:30  . ESOPHAGOGASTRODUODENOSCOPY N/A 11/12/2015   Procedure: ESOPHAGOGASTRODUODENOSCOPY (EGD);  Surgeon: Rogene Houston, MD;  [facility];  Service: Endoscopy;  Laterality: N/A;  . REVERSE SHOULDER ARTHROPLASTY Right 11/04/2018  . REVERSE SHOULDER ARTHROPLASTY Right 11/04/2018   Procedure: REVERSE SHOULDER ARTHROPLASTY;  Surgeon: Verner Mould, MD;  Location: [place];  Service: Orthopedics;  Laterality: Right;  . RIGHT/LEFT HEART CATH AND CORONARY ANGIOGRAPHY N/A 10/18/2017   Procedure: RIGHT/LEFT HEART CATH AND CORONARY ANGIOGRAPHY;  Surgeon: Burnell Blanks, MD;  Location: [place] [facility];  Service: Cardiovascular;  Laterality: N/A;  . TOTAL HIP ARTHROPLASTY Left 03/22/2016   Procedure: LEFT TOTAL HIP ARTHROPLASTY ANTERIOR APPROACH;  Surgeon: Gaynelle Arabian, MD;  [facility];  Service: Orthopedics;  Laterality: Left;    There were no vitals filed for this visit.  Subjective Assessment - 02/26/19 0934    Subjective   S: It still hurts me when I'm typing or sitting at my desk.    Currently in Pain?  Yes    Pain Score  4     Pain Location  Shoulder    Pain  Orientation  Right    Pain Descriptors / Indicators  Sore    Pain Radiating Towards  down to hand    Pain Onset  More than a month ago    Pain Frequency  Constant    Aggravating Factors   Arm position at work when typing, sitting at desk, writing.    Pain Relieving Factors  CBD oil    Effect of Pain on Daily Activities  mod effect    Multiple Pain Sites  No         [facility] OT Assessment - 02/26/19 0926      Assessment   Medical Diagnosis  S/P Right Reverse Total Shoulder Replacement      Precautions   Precautions  Shoulder    Type of Shoulder Precautions  P/AA/A/ROM with limitations of 130 flexion and 40 external rotation, no strengthening                OT Treatments/Exercises (OP) - 02/26/19 0001      Exercises   Exercises  Shoulder      Shoulder Exercises: Supine   Protraction  PROM;5 reps;Strengthening;12 reps    Protraction Weight (lbs)  1    Horizontal ABduction  PROM;5 reps;Strengthening;12 reps    Horizontal ABduction Weight (lbs)  1    External Rotation  PROM;5 reps;Strengthening;12 reps    External Rotation Weight (lbs)  1    Internal Rotation  PROM;5 reps;Strengthening;12 reps    Internal Rotation Weight (lbs)  1    Flexion  PROM;5 reps;Strengthening;12 reps    Shoulder Flexion Weight (lbs)  1    ABduction  PROM;5 reps;Strengthening;12 reps    Shoulder ABduction Weight (lbs)  1      Shoulder Exercises: Standing   Protraction  Strengthening;10 reps    Protraction Weight (lbs)  1    Horizontal ABduction  Strengthening;10 reps    Horizontal ABduction Weight (lbs)  1    External Rotation  Strengthening;10 reps    External Rotation Weight (lbs)  1    Internal Rotation  Strengthening;10 reps    Internal Rotation Weight (lbs)  1    Flexion  Strengthening;10 reps    Shoulder Flexion Weight (lbs)  1    ABduction  Strengthening;10 reps    Shoulder ABduction Weight (lbs)  1      Shoulder Exercises: ROM/Strengthening   Proximal Shoulder Strengthening, Supine  10X with 1# no rest breaks       Functional Reaching Activities   High Level  Functinal reaching task completed while placing 10 cones onto top shelf of cabinet. Removed in same fashion.       Manual Therapy   Manual Therapy  Myofascial release    Manual therapy comments  manual therapy completed seperately from all other interventions    Myofascial Release  myofascial release and manual stretching to right upper arm, scapular, and trapezius to decrease fascial and scar restrictions and improve pain free mobility and strength.                 OT Short Term Goals - 02/21/19 0945      OT SHORT TERM GOAL #1   Title  Patient will be  educated and independent with HEP for right arm mobility and strength.    Time  4    Period  Weeks    Target Date  01/22/19      OT SHORT TERM GOAL #2   Title  Patient will improve right shoulder P/ROM  to [facility] in order to don shirts and pants with increased independence.    Time  4    Period  Weeks      OT SHORT TERM GOAL #3   Title  Patient will increase right shoulder strength to 3+/5 for increased ability to lift laundry baskets, her dogs, and items at work.    Time  4    Period  Weeks      OT SHORT TERM GOAL #4   Title  Patient will decrease pain in right shoulder to 5/10 or less during completing ADL tasks.    Time  4    Period  Weeks      OT SHORT TERM GOAL #5   Title  Patient will decrease fascial and scar restrictions to moderate in right upper arm for increased mobility and less pain.    Time  4    Period  Weeks        OT Long Term Goals - 02/18/19 1724      OT LONG TERM GOAL #1   Title  Patient will return to prior level of independence and use of right arm as dominant with all B/IADLs, work, and leisure tasks.    Time  8    Period  Weeks    Status  On-going      OT LONG TERM GOAL #2   Title  Patient will increase A/ROM to [facility] in right shoulder for improved ability to comb her hair, tuck shirts in, and reach into overhead cabinets.    Time  8    Period  Weeks    Status  On-going      OT LONG TERM GOAL #3   Title  Patient will increase right shoulder strength to 4/5 or better for improved ability to lift boxes of drinks at work.    Time  8    Period  Weeks    Status  On-going      OT LONG TERM GOAL #4   Title  Patient will decrease right shoulder pain to 2/10 or better for improved ability to complete daily tasks.    Time  8    Period  Weeks    Status  On-going      OT LONG TERM GOAL #5   Title  Patient will decrease fascial restrictions in right shoulder region to minimal for improved ability to complete daily tasks with less pain.    Time  8     Period  Weeks    Status  On-going            Plan - 02/26/19 1506    Clinical Impression Statement  A: Continued to utilized 1# weight to focus on strengthening. Increased repetitions to 12 supine. Pt displayed increased tenderness and decreased tolerance to passive stretching. Did complete functional reaching task in cabinet and was able to reach top shelf. VC for form and technique were provided. Manual techniques utilized to address fascial restrictions.    Body Structure / Function / Physical Skills  ADL;Muscle spasms;UE functional use;Fascial restriction;Pain;Strength;ROM    Plan  P: Continue with shoulder strengthening using 1# hand weight. Attempt proximal shoulder strengthening usign washcloth on door.    Consulted and Agree with Plan of Care  Patient       Patient will benefit from skilled therapeutic intervention in order to improve the following deficits and impairments:   Body Structure / Function / Physical Skills: ADL, Muscle spasms, UE functional use, Fascial restriction,  Pain, Strength, ROM       Visit Diagnosis: Other symptoms and signs involving the musculoskeletal system  Stiffness of right shoulder, not elsewhere classified  Acute pain of right shoulder    Problem List Patient Active Problem List   Diagnosis Date Noted  . Closed fracture of right proximal humerus 11/04/2018  . Primary osteoarthritis involving multiple joints 09/12/2018  . Morbid obesity ([place]) 03/29/2018  . Severe aortic stenosis   . Iron deficiency anemia 10/13/2017  . Anxiety and depression 04/28/2017  . Encounter for long-term opiate analgesic use 04/28/2017  . Psychophysiological insomnia 04/28/2017  . OA (osteoarthritis) of hip 03/22/2016  . Chronic right hip pain 10/29/2015  . Chronic kidney disease (CKD) stage G3b/A1, moderately decreased glomerular filtration rate (GFR) between 30-44 mL/min/1.73 square meter and albuminuria creatinine ratio less than 30 mg/g 06/03/2015  . Type  2 diabetes mellitus ([place]) 11/09/2014  . Diastolic dysfunction, left ventricle 05/16/2013  . Calcific aortic stenosis 05/16/2013  . Gout 05/12/2013  . Diabetic retinopathy ([place]) 12/17/2012  . Hypertension 06/10/2012  . Hyperlipidemia 06/10/2012  . Hypothyroidism 06/10/2012   Ailene Ravel, OTR/L,CBIS  [phone]  02/26/2019, 3:09 PM  [place] [address] [place], [name], [address] Phone: [phone]   Fax:  [phone]  Name: Sabrina Deleon MRN: [id number] Date of Birth: June 27, 1950

## 2019-02-28 ENCOUNTER — Encounter (HOSPITAL_COMMUNITY): Payer: Self-pay | Admitting: Occupational Therapy

## 2019-02-28 ENCOUNTER — Other Ambulatory Visit: Payer: Self-pay

## 2019-02-28 ENCOUNTER — Ambulatory Visit (HOSPITAL_COMMUNITY): Payer: Worker's Compensation | Admitting: Occupational Therapy

## 2019-02-28 DIAGNOSIS — R29898 Other symptoms and signs involving the musculoskeletal system: Secondary | ICD-10-CM

## 2019-02-28 DIAGNOSIS — M25611 Stiffness of right shoulder, not elsewhere classified: Secondary | ICD-10-CM

## 2019-02-28 DIAGNOSIS — M25511 Pain in right shoulder: Secondary | ICD-10-CM

## 2019-02-28 NOTE — Therapy (Signed)
[place], [name], [address] Phone: [phone]   Fax:  [phone]  Occupational Therapy Treatment  Patient Details  Name: Sabrina Deleon MRN: [id number] Date of Birth: [birth year] Referring Provider (OT): Dr. Jeannie Fend   Encounter Date: 02/28/2019  OT End of Session - 02/28/19 1527    Visit Number  19    Number of Visits  24    Date for OT Re-Evaluation  03/18/19    Authorization Type  workers compensation 12 additional visits authorized    Authorization - Visit Number  6    Authorization - Number of Visits  12    OT Start Time  [id number]    OT Stop Time  1520    OT Time Calculation (min)  38 min    Activity Tolerance  Patient tolerated treatment well    Behavior During Therapy  [facility] for tasks assessed/performed       Past Medical History:  Diagnosis Date  . Anxiety   . Aortic stenosis   . Arthritis   . Chronic kidney disease    Dr. Tami Ribas- noted increase kidney function levels- took pt off Metformin and started on Onglyza for Blood sugar control-pt being followed for this.  Marland Kitchen GERD (gastroesophageal reflux disease)   . Gout   . Heart murmur   . Hypertension   . Hypothyroidism   . Morbid obesity ([place]) 03/29/2018   Patient has elevated BMI with diabetes hyperlipidemia and hypertension  . Pancreatitis   . Type 2 diabetes mellitus ([place])   . Wears glasses   . Wears partial dentures    Upper    Past Surgical History:  Procedure Laterality Date  . BACK SURGERY     Lumbar  . CARPAL TUNNEL RELEASE  11/02/2011   Procedure: CARPAL TUNNEL RELEASE;  Surgeon: Tennis Must, MD;  Location: [place];  Service: Orthopedics;  Laterality: Right;  . CARPAL TUNNEL RELEASE Left 01/14/2015   Procedure: LEFT CARPAL TUNNEL RELEASE;  Surgeon: Leanora Cover, MD;  Location: [place];  Service: Orthopedics;  Laterality: Left;  . CHOLECYSTECTOMY     laparoscopic  . COLONOSCOPY    .  COLONOSCOPY N/A 11/12/2015   Procedure: COLONOSCOPY;  Surgeon: Rogene Houston, MD;  [facility];  Service: Endoscopy;  Laterality: N/A;  9:30  . ESOPHAGOGASTRODUODENOSCOPY N/A 11/12/2015   Procedure: ESOPHAGOGASTRODUODENOSCOPY (EGD);  Surgeon: Rogene Houston, MD;  [facility];  Service: Endoscopy;  Laterality: N/A;  . REVERSE SHOULDER ARTHROPLASTY Right 11/04/2018  . REVERSE SHOULDER ARTHROPLASTY Right 11/04/2018   Procedure: REVERSE SHOULDER ARTHROPLASTY;  Surgeon: Verner Mould, MD;  Location: [place];  Service: Orthopedics;  Laterality: Right;  . RIGHT/LEFT HEART CATH AND CORONARY ANGIOGRAPHY N/A 10/18/2017   Procedure: RIGHT/LEFT HEART CATH AND CORONARY ANGIOGRAPHY;  Surgeon: Burnell Blanks, MD;  Location: [place] [facility];  Service: Cardiovascular;  Laterality: N/A;  . TOTAL HIP ARTHROPLASTY Left 03/22/2016   Procedure: LEFT TOTAL HIP ARTHROPLASTY ANTERIOR APPROACH;  Surgeon: Gaynelle Arabian, MD;  [facility];  Service: Orthopedics;  Laterality: Left;    There were no vitals filed for this visit.  Subjective Assessment - 02/28/19 1455    Subjective   S: It's really hurting me today.    Currently in Pain?  Yes    Pain Score  4    10 with movement   Pain Location  Shoulder    Pain Orientation  Right  Pain Descriptors / Indicators  Sore    Pain Type  Acute pain    Pain Radiating Towards  down to hand    Pain Onset  More than a month ago    Pain Frequency  Constant    Aggravating Factors   movement, working    Pain Relieving Factors  CBD oil, heat    Effect of Pain on Daily Activities  mod effect on ADLs    Multiple Pain Sites  No         [facility] OT Assessment - 02/28/19 1444      Assessment   Medical Diagnosis  S/P Right Reverse Total Shoulder Replacement      Precautions   Precautions  Shoulder    Type of Shoulder Precautions  P/AA/A/ROM with limitations of 130 flexion and 40 external rotation, no strengthening                OT Treatments/Exercises (OP) - 02/28/19 1455      Exercises   Exercises  Shoulder      Shoulder Exercises: Supine   Protraction  PROM;5 reps;Strengthening;10 reps    Protraction Weight (lbs)  1    Horizontal ABduction  PROM;5 reps;Strengthening;10 reps    Horizontal ABduction Weight (lbs)  1    External Rotation  PROM;5 reps;Strengthening;10 reps    External Rotation Weight (lbs)  1    Internal Rotation  PROM;5 reps;Strengthening;10 reps    Internal Rotation Weight (lbs)  1    Flexion  PROM;5 reps;Strengthening;10 reps    Shoulder Flexion Weight (lbs)  1    ABduction  PROM;5 reps;Strengthening;10 reps    Shoulder ABduction Weight (lbs)  1      Shoulder Exercises: ROM/Strengthening   Proximal Shoulder Strengthening, Supine  10X with 1# no rest breaks       Modalities   Modalities  Electrical Stimulation;Moist Heat      Moist Heat Therapy   Number Minutes Moist Heat  15 Minutes    Moist Heat Location  Shoulder      Electrical Stimulation   Electrical Stimulation Location  right shoulder    Electrical Stimulation Action  interferential    Electrical Stimulation Goals  Pain               OT Short Term Goals - 02/21/19 0945      OT SHORT TERM GOAL #1   Title  Patient will be educated and independent with HEP for right arm mobility and strength.    Time  4    Period  Weeks    Target Date  01/22/19      OT SHORT TERM GOAL #2   Title  Patient will improve right shoulder P/ROM to [facility] in order to don shirts and pants with increased independence.    Time  4    Period  Weeks      OT SHORT TERM GOAL #3   Title  Patient will increase right shoulder strength to 3+/5 for increased ability to lift laundry baskets, her dogs, and items at work.    Time  4    Period  Weeks      OT SHORT TERM GOAL #4   Title  Patient will decrease pain in right shoulder to 5/10 or less during completing ADL tasks.    Time  4    Period  Weeks      OT SHORT TERM GOAL  #5   Title  Patient will decrease fascial and scar  restrictions to moderate in right upper arm for increased mobility and less pain.    Time  4    Period  Weeks        OT Long Term Goals - 02/18/19 1724      OT LONG TERM GOAL #1   Title  Patient will return to prior level of independence and use of right arm as dominant with all B/IADLs, work, and leisure tasks.    Time  8    Period  Weeks    Status  On-going      OT LONG TERM GOAL #2   Title  Patient will increase A/ROM to [facility] in right shoulder for improved ability to comb her hair, tuck shirts in, and reach into overhead cabinets.    Time  8    Period  Weeks    Status  On-going      OT LONG TERM GOAL #3   Title  Patient will increase right shoulder strength to 4/5 or better for improved ability to lift boxes of drinks at work.    Time  8    Period  Weeks    Status  On-going      OT LONG TERM GOAL #4   Title  Patient will decrease right shoulder pain to 2/10 or better for improved ability to complete daily tasks.    Time  8    Period  Weeks    Status  On-going      OT LONG TERM GOAL #5   Title  Patient will decrease fascial restrictions in right shoulder region to minimal for improved ability to complete daily tasks with less pain.    Time  8    Period  Weeks    Status  On-going            Plan - 02/28/19 1527    Clinical Impression Statement  A: Pt reporting increased pain and soreness today, no manual therapy completed due to low tolerance to manual techniques today. Completed strengthening in supine, pt with min pain with arm straight, reports pain in bicep region when attempting to move arm. Has to hold arm to put on makeup and fix hair. ES used for pain at end of session, pt reports min improvement.    Body Structure / Function / Physical Skills  ADL;Muscle spasms;UE functional use;Fascial restriction;Pain;Strength;ROM    Plan  P: Follow up on shoulder pain, attempt to resume strengthening in standing        Patient will benefit from skilled therapeutic intervention in order to improve the following deficits and impairments:   Body Structure / Function / Physical Skills: ADL, Muscle spasms, UE functional use, Fascial restriction, Pain, Strength, ROM       Visit Diagnosis: Other symptoms and signs involving the musculoskeletal system  Stiffness of right shoulder, not elsewhere classified  Acute pain of right shoulder    Problem List Patient Active Problem List   Diagnosis Date Noted  . Closed fracture of right proximal humerus 11/04/2018  . Primary osteoarthritis involving multiple joints 09/12/2018  . Morbid obesity ([place]) 03/29/2018  . Severe aortic stenosis   . Iron deficiency anemia 10/13/2017  . Anxiety and depression 04/28/2017  . Encounter for long-term opiate analgesic use 04/28/2017  . Psychophysiological insomnia 04/28/2017  . OA (osteoarthritis) of hip 03/22/2016  . Chronic right hip pain 10/29/2015  . Chronic kidney disease (CKD) stage G3b/A1, moderately decreased glomerular filtration rate (GFR) between 30-44 mL/min/1.73 square meter and albuminuria creatinine  ratio less than 30 mg/g 06/03/2015  . Type 2 diabetes mellitus ([place]) 11/09/2014  . Diastolic dysfunction, left ventricle 05/16/2013  . Calcific aortic stenosis 05/16/2013  . Gout 05/12/2013  . Diabetic retinopathy ([place]) 12/17/2012  . Hypertension 06/10/2012  . Hyperlipidemia 06/10/2012  . Hypothyroidism 06/10/2012   Guadelupe Sabin, OTR/L  [phone] 02/28/2019, 3:33 PM  [place] [address] [place], [name], [address] Phone: [phone]   Fax:  [phone]  Name: NAKEA KROLICK MRN: [id number] Date of Birth: 1950/11/30

## 2019-03-04 ENCOUNTER — Ambulatory Visit (HOSPITAL_COMMUNITY): Payer: Worker's Compensation | Admitting: Occupational Therapy

## 2019-03-04 ENCOUNTER — Encounter (HOSPITAL_COMMUNITY): Payer: Self-pay | Admitting: Occupational Therapy

## 2019-03-04 ENCOUNTER — Other Ambulatory Visit: Payer: Self-pay

## 2019-03-04 DIAGNOSIS — M25611 Stiffness of right shoulder, not elsewhere classified: Secondary | ICD-10-CM

## 2019-03-04 DIAGNOSIS — M25511 Pain in right shoulder: Secondary | ICD-10-CM

## 2019-03-04 DIAGNOSIS — R29898 Other symptoms and signs involving the musculoskeletal system: Secondary | ICD-10-CM

## 2019-03-04 NOTE — Therapy (Signed)
[place], [name], [address] Phone: [phone]   Fax:  [phone]  Occupational Therapy Treatment  Patient Details  Name: Sabrina Deleon MRN: [id number] Date of Birth: Oct 02, 1950 Referring Provider (OT): Dr. Jeannie Fend   Encounter Date: 03/04/2019  OT End of Session - 03/04/19 1342    Visit Number  20    Number of Visits  24    Date for OT Re-Evaluation  03/18/19    Authorization Type  workers compensation 12 additional visits authorized    Authorization - Visit Number  7    Authorization - Number of Visits  12    OT Start Time  1300    OT Stop Time  1341    OT Time Calculation (min)  41 min    Activity Tolerance  Patient tolerated treatment well    Behavior During Therapy  [facility] for tasks assessed/performed       Past Medical History:  Diagnosis Date  . Anxiety   . Aortic stenosis   . Arthritis   . Chronic kidney disease    Dr. Tami Ribas- noted increase kidney function levels- took pt off Metformin and started on Onglyza for Blood sugar control-pt being followed for this.  Marland Kitchen GERD (gastroesophageal reflux disease)   . Gout   . Heart murmur   . Hypertension   . Hypothyroidism   . Morbid obesity ([place]) 03/29/2018   Patient has elevated BMI with diabetes hyperlipidemia and hypertension  . Pancreatitis   . Type 2 diabetes mellitus ([place])   . Wears glasses   . Wears partial dentures    Upper    Past Surgical History:  Procedure Laterality Date  . BACK SURGERY     Lumbar  . CARPAL TUNNEL RELEASE  11/02/2011   Procedure: CARPAL TUNNEL RELEASE;  Surgeon: Tennis Must, MD;  Location: [place];  Service: Orthopedics;  Laterality: Right;  . CARPAL TUNNEL RELEASE Left 01/14/2015   Procedure: LEFT CARPAL TUNNEL RELEASE;  Surgeon: Leanora Cover, MD;  Location: [place];  Service: Orthopedics;  Laterality: Left;  . CHOLECYSTECTOMY     laparoscopic  . COLONOSCOPY    .  COLONOSCOPY N/A 11/12/2015   Procedure: COLONOSCOPY;  Surgeon: Rogene Houston, MD;  [facility];  Service: Endoscopy;  Laterality: N/A;  9:30  . ESOPHAGOGASTRODUODENOSCOPY N/A 11/12/2015   Procedure: ESOPHAGOGASTRODUODENOSCOPY (EGD);  Surgeon: Rogene Houston, MD;  [facility];  Service: Endoscopy;  Laterality: N/A;  . REVERSE SHOULDER ARTHROPLASTY Right 11/04/2018  . REVERSE SHOULDER ARTHROPLASTY Right 11/04/2018   Procedure: REVERSE SHOULDER ARTHROPLASTY;  Surgeon: Verner Mould, MD;  Location: [facility];  Service: Orthopedics;  Laterality: Right;  . RIGHT/LEFT HEART CATH AND CORONARY ANGIOGRAPHY N/A 10/18/2017   Procedure: RIGHT/LEFT HEART CATH AND CORONARY ANGIOGRAPHY;  Surgeon: Burnell Blanks, MD;  Location: [place];  Service: Cardiovascular;  Laterality: N/A;  . TOTAL HIP ARTHROPLASTY Left 03/22/2016   Procedure: LEFT TOTAL HIP ARTHROPLASTY ANTERIOR APPROACH;  Surgeon: Gaynelle Arabian, MD;  [facility];  Service: Orthopedics;  Laterality: Left;    There were no vitals filed for this visit.  Subjective Assessment - 03/04/19 1300    Subjective   S: I tried to rest it over the weekend.    Currently in Pain?  Yes    Pain Score  3     Pain Location  Shoulder    Pain Orientation  Right  Pain Descriptors / Indicators  Aching;Sore    Pain Type  Acute pain    Pain Radiating Towards  down to hand    Pain Onset  More than a month ago    Pain Frequency  Constant    Aggravating Factors   movement, working    Pain Relieving Factors  CBD oil, ADLs    Effect of Pain on Daily Activities  mod effect on ADLs    Multiple Pain Sites  No         [facility] OT Assessment - 03/04/19 1300      Assessment   Medical Diagnosis  S/P Right Reverse Total Shoulder Replacement      Precautions   Precautions  Shoulder    Type of Shoulder Precautions  P/AA/A/ROM with limitations of 130 flexion and 40 external rotation, no strengthening                OT Treatments/Exercises (OP) - 03/04/19 1303      Exercises   Exercises  Shoulder      Shoulder Exercises: Supine   Protraction  PROM;5 reps;Strengthening;12 reps    Protraction Weight (lbs)  1    Horizontal ABduction  PROM;5 reps;Strengthening;12 reps    Horizontal ABduction Weight (lbs)  1    External Rotation  PROM;5 reps;Strengthening;12 reps    External Rotation Weight (lbs)  1    Internal Rotation  PROM;5 reps;Strengthening;12 reps    Internal Rotation Weight (lbs)  1    Flexion  PROM;5 reps;Strengthening;12 reps    Shoulder Flexion Weight (lbs)  1    ABduction  PROM;5 reps;Strengthening;12 reps    Shoulder ABduction Weight (lbs)  1      Shoulder Exercises: Standing   Protraction  Strengthening;10 reps    Protraction Weight (lbs)  1    Horizontal ABduction  Strengthening;10 reps    Horizontal ABduction Weight (lbs)  1    External Rotation  Strengthening;10 reps    External Rotation Weight (lbs)  1    Internal Rotation  Strengthening;10 reps    Internal Rotation Weight (lbs)  1    Flexion  Strengthening;10 reps    Shoulder Flexion Weight (lbs)  1    ABduction  Strengthening;10 reps    Shoulder ABduction Weight (lbs)  1      Shoulder Exercises: ROM/Strengthening   UBE (Upper Arm Bike)  Level 2 3' forward    Proximal Shoulder Strengthening, Supine  10X with 1# no rest breaks     Proximal Shoulder Strengthening, Seated  10X with 1#, 1 rest break      Manual Therapy   Manual Therapy  Myofascial release    Manual therapy comments  manual therapy completed seperately from all other interventions    Myofascial Release  myofascial release and manual stretching to right upper arm, scapular, and trapezius to decrease fascial and scar restrictions and improve pain free mobility and strength.                 OT Short Term Goals - 02/21/19 0945      OT SHORT TERM GOAL #1   Title  Patient will be educated and independent with HEP for right arm  mobility and strength.    Time  4    Period  Weeks    Target Date  01/22/19      OT SHORT TERM GOAL #2   Title  Patient will improve right shoulder P/ROM to [facility] in order to don shirts and  pants with increased independence.    Time  4    Period  Weeks      OT SHORT TERM GOAL #3   Title  Patient will increase right shoulder strength to 3+/5 for increased ability to lift laundry baskets, her dogs, and items at work.    Time  4    Period  Weeks      OT SHORT TERM GOAL #4   Title  Patient will decrease pain in right shoulder to 5/10 or less during completing ADL tasks.    Time  4    Period  Weeks      OT SHORT TERM GOAL #5   Title  Patient will decrease fascial and scar restrictions to moderate in right upper arm for increased mobility and less pain.    Time  4    Period  Weeks        OT Long Term Goals - 02/18/19 1724      OT LONG TERM GOAL #1   Title  Patient will return to prior level of independence and use of right arm as dominant with all B/IADLs, work, and leisure tasks.    Time  8    Period  Weeks    Status  On-going      OT LONG TERM GOAL #2   Title  Patient will increase A/ROM to [facility] in right shoulder for improved ability to comb her hair, tuck shirts in, and reach into overhead cabinets.    Time  8    Period  Weeks    Status  On-going      OT LONG TERM GOAL #3   Title  Patient will increase right shoulder strength to 4/5 or better for improved ability to lift boxes of drinks at work.    Time  8    Period  Weeks    Status  On-going      OT LONG TERM GOAL #4   Title  Patient will decrease right shoulder pain to 2/10 or better for improved ability to complete daily tasks.    Time  8    Period  Weeks    Status  On-going      OT LONG TERM GOAL #5   Title  Patient will decrease fascial restrictions in right shoulder region to minimal for improved ability to complete daily tasks with less pain.    Time  8    Period  Weeks    Status  On-going             Plan - 03/04/19 1319    Clinical Impression Statement  A: Pt reports improvement in pain level compared to Friday's session. Pt able to tolerate manual therapy today and passive stretching. Resumed strengthening exercises and added proximal shoulder strengthening on doorway. Also added UBE. Verbal cuing for form and technique.    Body Structure / Function / Physical Skills  ADL;Muscle spasms;UE functional use;Fascial restriction;Pain;Strength;ROM    Plan  P: Continue with strengthening, attempt x to v arms and ball on wall       Patient will benefit from skilled therapeutic intervention in order to improve the following deficits and impairments:   Body Structure / Function / Physical Skills: ADL, Muscle spasms, UE functional use, Fascial restriction, Pain, Strength, ROM       Visit Diagnosis: Other symptoms and signs involving the musculoskeletal system  Stiffness of right shoulder, not elsewhere classified  Acute pain of right shoulder    Problem  List Patient Active Problem List   Diagnosis Date Noted  . Closed fracture of right proximal humerus 11/04/2018  . Primary osteoarthritis involving multiple joints 09/12/2018  . Morbid obesity ([place]) 03/29/2018  . Severe aortic stenosis   . Iron deficiency anemia 10/13/2017  . Anxiety and depression 04/28/2017  . Encounter for long-term opiate analgesic use 04/28/2017  . Psychophysiological insomnia 04/28/2017  . OA (osteoarthritis) of hip 03/22/2016  . Chronic right hip pain 10/29/2015  . Chronic kidney disease (CKD) stage G3b/A1, moderately decreased glomerular filtration rate (GFR) between 30-44 mL/min/1.73 square meter and albuminuria creatinine ratio less than 30 mg/g 06/03/2015  . Type 2 diabetes mellitus ([place]) 11/09/2014  . Diastolic dysfunction, left ventricle 05/16/2013  . Calcific aortic stenosis 05/16/2013  . Gout 05/12/2013  . Diabetic retinopathy ([place]) 12/17/2012  . Hypertension 06/10/2012  .  Hyperlipidemia 06/10/2012  . Hypothyroidism 06/10/2012   Guadelupe Sabin, OTR/L  [phone] 03/04/2019, 1:43 PM  [facility] [address] [place], [name], [address] Phone: [phone]   Fax:  [phone]  Name: Sabrina Deleon MRN: [id number] Date of Birth: [birth year]

## 2019-03-06 ENCOUNTER — Encounter (HOSPITAL_COMMUNITY): Payer: Self-pay | Admitting: Occupational Therapy

## 2019-03-06 ENCOUNTER — Ambulatory Visit (HOSPITAL_COMMUNITY): Payer: Worker's Compensation | Admitting: Occupational Therapy

## 2019-03-06 ENCOUNTER — Other Ambulatory Visit: Payer: Self-pay

## 2019-03-06 DIAGNOSIS — M25511 Pain in right shoulder: Secondary | ICD-10-CM

## 2019-03-06 DIAGNOSIS — R29898 Other symptoms and signs involving the musculoskeletal system: Secondary | ICD-10-CM

## 2019-03-06 DIAGNOSIS — M25611 Stiffness of right shoulder, not elsewhere classified: Secondary | ICD-10-CM

## 2019-03-06 NOTE — Therapy (Signed)
[place], [name], [address] Phone: [phone]   Fax:  [phone]  Occupational Therapy Treatment  Patient Details  Name: Sabrina Deleon MRN: [id number] Date of Birth: [birth year] Referring Provider (OT): Dr. Jeannie Fend   Encounter Date: 03/06/2019  OT End of Session - 03/06/19 1452    Visit Number  21    Number of Visits  24    Date for OT Re-Evaluation  03/18/19    Authorization Type  workers compensation 12 additional visits authorized    Authorization - Visit Number  8    Authorization - Number of Visits  12    OT Start Time  1347    OT Stop Time  1427    OT Time Calculation (min)  40 min    Activity Tolerance  Patient tolerated treatment well    Behavior During Therapy  [facility] for tasks assessed/performed       Past Medical History:  Diagnosis Date  . Anxiety   . Aortic stenosis   . Arthritis   . Chronic kidney disease    Dr. Tami Ribas- noted increase kidney function levels- took pt off Metformin and started on Onglyza for Blood sugar control-pt being followed for this.  Marland Kitchen GERD (gastroesophageal reflux disease)   . Gout   . Heart murmur   . Hypertension   . Hypothyroidism   . Morbid obesity ([place]) 03/29/2018   Patient has elevated BMI with diabetes hyperlipidemia and hypertension  . Pancreatitis   . Type 2 diabetes mellitus ([place])   . Wears glasses   . Wears partial dentures    Upper    Past Surgical History:  Procedure Laterality Date  . BACK SURGERY     Lumbar  . CARPAL TUNNEL RELEASE  11/02/2011   Procedure: CARPAL TUNNEL RELEASE;  Surgeon: Tennis Must, MD;  Location: [facility];  Service: Orthopedics;  Laterality: Right;  . CARPAL TUNNEL RELEASE Left 01/14/2015   Procedure: LEFT CARPAL TUNNEL RELEASE;  Surgeon: Leanora Cover, MD;  Location: [place];  Service: Orthopedics;  Laterality: Left;  . CHOLECYSTECTOMY     laparoscopic  . COLONOSCOPY    .  COLONOSCOPY N/A 11/12/2015   Procedure: COLONOSCOPY;  Surgeon: Rogene Houston, MD;  [facility];  Service: Endoscopy;  Laterality: N/A;  9:30  . ESOPHAGOGASTRODUODENOSCOPY N/A 11/12/2015   Procedure: ESOPHAGOGASTRODUODENOSCOPY (EGD);  Surgeon: Rogene Houston, MD;  [facility];  Service: Endoscopy;  Laterality: N/A;  . REVERSE SHOULDER ARTHROPLASTY Right 11/04/2018  . REVERSE SHOULDER ARTHROPLASTY Right 11/04/2018   Procedure: REVERSE SHOULDER ARTHROPLASTY;  Surgeon: Verner Mould, MD;  Location: [place];  Service: Orthopedics;  Laterality: Right;  . RIGHT/LEFT HEART CATH AND CORONARY ANGIOGRAPHY N/A 10/18/2017   Procedure: RIGHT/LEFT HEART CATH AND CORONARY ANGIOGRAPHY;  Surgeon: Burnell Blanks, MD;  Location: [place] [facility];  Service: Cardiovascular;  Laterality: N/A;  . TOTAL HIP ARTHROPLASTY Left 03/22/2016   Procedure: LEFT TOTAL HIP ARTHROPLASTY ANTERIOR APPROACH;  Surgeon: Gaynelle Arabian, MD;  [facility];  Service: Orthopedics;  Laterality: Left;    There were no vitals filed for this visit.  Subjective Assessment - 03/06/19 1348    Subjective   S: It's feeling good today, whatever good is.    Currently in Pain?  Yes    Pain Score  3     Pain Location  Shoulder    Pain Orientation  Right  Pain Descriptors / Indicators  Aching    Pain Type  Acute pain    Pain Radiating Towards  down to hand    Pain Onset  More than a month ago    Pain Frequency  Constant    Aggravating Factors   movement, working    Pain Relieving Factors  CBD oil, ADLS    Effect of Pain on Daily Activities  mod effect on ADLs    Multiple Pain Sites  No         [facility] OT Assessment - 03/06/19 1348      Assessment   Medical Diagnosis  S/P Right Reverse Total Shoulder Replacement      Precautions   Precautions  Shoulder    Type of Shoulder Precautions  P/AA/A/ROM with limitations of 130 flexion and 40 external rotation, no strengthening                OT Treatments/Exercises (OP) - 03/06/19 1349      Exercises   Exercises  Shoulder      Shoulder Exercises: Supine   Protraction  PROM;5 reps;Strengthening;15 reps    Protraction Weight (lbs)  1    Horizontal ABduction  PROM;5 reps;Strengthening;15 reps    Horizontal ABduction Weight (lbs)  1    External Rotation  PROM;5 reps;Strengthening;15 reps    External Rotation Weight (lbs)  1    Internal Rotation  PROM;5 reps;Strengthening;15 reps    Internal Rotation Weight (lbs)  1    Flexion  PROM;5 reps;Strengthening;15 reps    Shoulder Flexion Weight (lbs)  1    ABduction  PROM;5 reps;Strengthening;15 reps    Shoulder ABduction Weight (lbs)  1      Shoulder Exercises: Standing   Protraction  Strengthening;12 reps    Protraction Weight (lbs)  1    Horizontal ABduction  Strengthening;12 reps    Horizontal ABduction Weight (lbs)  1    External Rotation  Strengthening;12 reps    External Rotation Weight (lbs)  1    Internal Rotation  Strengthening;12 reps    Internal Rotation Weight (lbs)  1    Flexion  Strengthening;12 reps    Shoulder Flexion Weight (lbs)  1    ABduction  Strengthening;12 reps    Shoulder ABduction Weight (lbs)  1      Shoulder Exercises: ROM/Strengthening   Over Head Lace  1' seated with 1# wrist weight    X to V Arms  10X, 1# within available ROM     Proximal Shoulder Strengthening, Supine  15X with 1# no rest breaks     Ball on Wall  1' flexion 1' abduction      Manual Therapy   Manual Therapy  Myofascial release    Manual therapy comments  manual therapy completed seperately from all other interventions    Myofascial Release  myofascial release and manual stretching to right upper arm, scapular, and trapezius to decrease fascial and scar restrictions and improve pain free mobility and strength.                 OT Short Term Goals - 02/21/19 0945      OT SHORT TERM GOAL #1   Title  Patient will be educated and independent  with HEP for right arm mobility and strength.    Time  4    Period  Weeks    Target Date  01/22/19      OT SHORT TERM GOAL #2   Title  Patient will  improve right shoulder P/ROM to [facility] in order to don shirts and pants with increased independence.    Time  4    Period  Weeks      OT SHORT TERM GOAL #3   Title  Patient will increase right shoulder strength to 3+/5 for increased ability to lift laundry baskets, her dogs, and items at work.    Time  4    Period  Weeks      OT SHORT TERM GOAL #4   Title  Patient will decrease pain in right shoulder to 5/10 or less during completing ADL tasks.    Time  4    Period  Weeks      OT SHORT TERM GOAL #5   Title  Patient will decrease fascial and scar restrictions to moderate in right upper arm for increased mobility and less pain.    Time  4    Period  Weeks        OT Long Term Goals - 02/18/19 1724      OT LONG TERM GOAL #1   Title  Patient will return to prior level of independence and use of right arm as dominant with all B/IADLs, work, and leisure tasks.    Time  8    Period  Weeks    Status  On-going      OT LONG TERM GOAL #2   Title  Patient will increase A/ROM to [facility] in right shoulder for improved ability to comb her hair, tuck shirts in, and reach into overhead cabinets.    Time  8    Period  Weeks    Status  On-going      OT LONG TERM GOAL #3   Title  Patient will increase right shoulder strength to 4/5 or better for improved ability to lift boxes of drinks at work.    Time  8    Period  Weeks    Status  On-going      OT LONG TERM GOAL #4   Title  Patient will decrease right shoulder pain to 2/10 or better for improved ability to complete daily tasks.    Time  8    Period  Weeks    Status  On-going      OT LONG TERM GOAL #5   Title  Patient will decrease fascial restrictions in right shoulder region to minimal for improved ability to complete daily tasks with less pain.    Time  8    Period  Weeks    Status   On-going            Plan - 03/06/19 1452    Clinical Impression Statement  A: Continued with strengthening this session, performed manual therapy prior to exercises with good tolerance. Progressed to 15 repetitions in supine, 12 in standing. Added ball on wall and x to v arms today, also added 1# wrist weight to overhead lacing. Verbal cuing for form and technique, rest breaks provided as needed.    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 Closed fracture of right proximal humerus 11/04/2018  . Primary osteoarthritis involving multiple joints 09/12/2018  . Morbid obesity ([place]) 03/29/2018  . Severe aortic stenosis   . Iron deficiency anemia 10/13/2017  . Anxiety and depression 04/28/2017  . Encounter for long-term opiate analgesic use 04/28/2017  . Psychophysiological insomnia 04/28/2017  . OA (osteoarthritis) of hip 03/22/2016  . Chronic right hip pain 10/29/2015  . Chronic kidney disease (CKD) stage G3b/A1, moderately decreased glomerular filtration rate (GFR) between 30-44 mL/min/1.73 square meter and albuminuria creatinine ratio less than 30 mg/g 06/03/2015  . Type 2 diabetes mellitus ([place]) 11/09/2014  . Diastolic dysfunction,  left ventricle 05/16/2013  . Calcific aortic stenosis 05/16/2013  . Gout 05/12/2013  . Diabetic retinopathy ([place]) 12/17/2012  . Hypertension 06/10/2012  . Hyperlipidemia 06/10/2012  . Hypothyroidism 06/10/2012   Guadelupe Sabin, OTR/L  [phone] 03/06/2019, 2:56 PM  [facility] [address] [place], [name], [address] Phone: [phone]   Fax:  [phone]  Name: Sabrina Deleon MRN: [id number] Date of Birth: [birth year]

## 2019-03-10 ENCOUNTER — Encounter (HOSPITAL_COMMUNITY): Payer: Self-pay | Admitting: Occupational Therapy

## 2019-03-10 ENCOUNTER — Ambulatory Visit (HOSPITAL_COMMUNITY): Payer: Worker's Compensation | Admitting: Occupational Therapy

## 2019-03-10 ENCOUNTER — Other Ambulatory Visit: Payer: Self-pay

## 2019-03-10 ENCOUNTER — Other Ambulatory Visit: Payer: Self-pay | Admitting: Family Medicine

## 2019-03-10 DIAGNOSIS — R29898 Other symptoms and signs involving the musculoskeletal system: Secondary | ICD-10-CM

## 2019-03-10 DIAGNOSIS — M25511 Pain in right shoulder: Secondary | ICD-10-CM

## 2019-03-10 DIAGNOSIS — M25611 Stiffness of right shoulder, not elsewhere classified: Secondary | ICD-10-CM

## 2019-03-10 NOTE — Therapy (Signed)
[place], [name], [address] Phone: [phone]   Fax:  [phone]  Occupational Therapy Treatment  Patient Details  Name: Sabrina Deleon MRN: [id number] Date of Birth: [birth year] Referring Provider (OT): Dr. Jeannie Fend   Encounter Date: 03/10/2019  OT End of Session - 03/10/19 1342    Visit Number  22    Number of Visits  24    Date for OT Re-Evaluation  03/18/19    Authorization Type  workers compensation 12 additional visits authorized    Authorization - Visit Number  9    Authorization - Number of Visits  12    OT Start Time  [id number]    OT Stop Time  1344    OT Time Calculation (min)  39 min    Activity Tolerance  Patient tolerated treatment well    Behavior During Therapy  [facility] for tasks assessed/performed       Past Medical History:  Diagnosis Date  . Anxiety   . Aortic stenosis   . Arthritis   . Chronic kidney disease    Dr. Tami Ribas- noted increase kidney function levels- took pt off Metformin and started on Onglyza for Blood sugar control-pt being followed for this.  Marland Kitchen GERD (gastroesophageal reflux disease)   . Gout   . Heart murmur   . Hypertension   . Hypothyroidism   . Morbid obesity ([place]) 03/29/2018   Patient has elevated BMI with diabetes hyperlipidemia and hypertension  . Pancreatitis   . Type 2 diabetes mellitus ([place])   . Wears glasses   . Wears partial dentures    Upper    Past Surgical History:  Procedure Laterality Date  . BACK SURGERY     Lumbar  . CARPAL TUNNEL RELEASE  11/02/2011   Procedure: CARPAL TUNNEL RELEASE;  Surgeon: Tennis Must, MD;  Location: [place];  Service: Orthopedics;  Laterality: Right;  . CARPAL TUNNEL RELEASE Left 01/14/2015   Procedure: LEFT CARPAL TUNNEL RELEASE;  Surgeon: Leanora Cover, MD;  Location: [place];  Service: Orthopedics;  Laterality: Left;  . CHOLECYSTECTOMY     laparoscopic  . COLONOSCOPY    .  COLONOSCOPY N/A 11/12/2015   Procedure: COLONOSCOPY;  Surgeon: Rogene Houston, MD;  [facility];  Service: Endoscopy;  Laterality: N/A;  9:30  . ESOPHAGOGASTRODUODENOSCOPY N/A 11/12/2015   Procedure: ESOPHAGOGASTRODUODENOSCOPY (EGD);  Surgeon: Rogene Houston, MD;  [facility];  Service: Endoscopy;  Laterality: N/A;  . REVERSE SHOULDER ARTHROPLASTY Right 11/04/2018  . REVERSE SHOULDER ARTHROPLASTY Right 11/04/2018   Procedure: REVERSE SHOULDER ARTHROPLASTY;  Surgeon: Verner Mould, MD;  Location: [place];  Service: Orthopedics;  Laterality: Right;  . RIGHT/LEFT HEART CATH AND CORONARY ANGIOGRAPHY N/A 10/18/2017   Procedure: RIGHT/LEFT HEART CATH AND CORONARY ANGIOGRAPHY;  Surgeon: Burnell Blanks, MD;  Location: [place] [facility];  Service: Cardiovascular;  Laterality: N/A;  . TOTAL HIP ARTHROPLASTY Left 03/22/2016   Procedure: LEFT TOTAL HIP ARTHROPLASTY ANTERIOR APPROACH;  Surgeon: Gaynelle Arabian, MD;  [facility];  Service: Orthopedics;  Laterality: Left;    There were no vitals filed for this visit.  Subjective Assessment - 03/10/19 1306    Subjective   S: It hasn't hurt me all weekend but when I got to work it started hurting again.    Currently in Pain?  Yes    Pain Score  2     Pain Location  Shoulder  Pain Orientation  Right    Pain Descriptors / Indicators  Aching;Sore    Pain Type  Acute pain    Pain Radiating Towards  down to hand    Pain Onset  More than a month ago    Pain Frequency  Constant    Aggravating Factors   working    Pain Relieving Factors  CBD oil, ADLs    Effect of Pain on Daily Activities  mod effect on ADLs    Multiple Pain Sites  No         [facility] OT Assessment - 03/10/19 1305      Assessment   Medical Diagnosis  S/P Right Reverse Total Shoulder Replacement      Precautions   Precautions  Shoulder    Type of Shoulder Precautions  P/AA/A/ROM with limitations of 130 flexion and 40 external rotation, no  strengthening               OT Treatments/Exercises (OP) - 03/10/19 1306      Exercises   Exercises  Shoulder      Shoulder Exercises: Supine   Protraction  PROM;5 reps    Horizontal ABduction  PROM;5 reps    External Rotation  PROM;5 reps    Internal Rotation  PROM;5 reps    Flexion  PROM;5 reps    ABduction  PROM;5 reps      Shoulder Exercises: ROM/Strengthening   Ball on Wall  1' flexion 1' abduction      Shoulder Exercises: Stretch   Cross Chest Stretch  3 reps;10 seconds    Internal Rotation Stretch  3 reps   10" horizontal towel   External Rotation Stretch  3 reps;10 seconds    Wall Stretch - Flexion  3 reps;10 seconds    Other Shoulder Stretches  doorway stretch, 3x10"      Manual Therapy   Manual Therapy  Myofascial release    Manual therapy comments  manual therapy completed seperately from all other interventions    Myofascial Release  myofascial release and manual stretching to right upper arm, scapular, and trapezius to decrease fascial and scar restrictions and improve pain free mobility and strength.               OT Education - 03/10/19 1323    Education Details  shoulder stretches    Person(s) Educated  Patient    Methods  Explanation;Handout;Demonstration    Comprehension  Verbalized understanding;Returned demonstration       OT Short Term Goals - 02/21/19 0945      OT SHORT TERM GOAL #1   Title  Patient will be educated and independent with HEP for right arm mobility and strength.    Time  4    Period  Weeks    Target Date  01/22/19      OT SHORT TERM GOAL #2   Title  Patient will improve right shoulder P/ROM to [facility] in order to don shirts and pants with increased independence.    Time  4    Period  Weeks      OT SHORT TERM GOAL #3   Title  Patient will increase right shoulder strength to 3+/5 for increased ability to lift laundry baskets, her dogs, and items at work.    Time  4    Period  Weeks      OT SHORT TERM GOAL #4    Title  Patient will decrease pain in right shoulder to 5/10 or less during  completing ADL tasks.    Time  4    Period  Weeks      OT SHORT TERM GOAL #5   Title  Patient will decrease fascial and scar restrictions to moderate in right upper arm for increased mobility and less pain.    Time  4    Period  Weeks        OT Long Term Goals - 02/18/19 1724      OT LONG TERM GOAL #1   Title  Patient will return to prior level of independence and use of right arm as dominant with all B/IADLs, work, and leisure tasks.    Time  8    Period  Weeks    Status  On-going      OT LONG TERM GOAL #2   Title  Patient will increase A/ROM to [facility] in right shoulder for improved ability to comb her hair, tuck shirts in, and reach into overhead cabinets.    Time  8    Period  Weeks    Status  On-going      OT LONG TERM GOAL #3   Title  Patient will increase right shoulder strength to 4/5 or better for improved ability to lift boxes of drinks at work.    Time  8    Period  Weeks    Status  On-going      OT LONG TERM GOAL #4   Title  Patient will decrease right shoulder pain to 2/10 or better for improved ability to complete daily tasks.    Time  8    Period  Weeks    Status  On-going      OT LONG TERM GOAL #5   Title  Patient will decrease fascial restrictions in right shoulder region to minimal for improved ability to complete daily tasks with less pain.    Time  8    Period  Weeks    Status  On-going            Plan - 03/10/19 1340    Clinical Impression Statement  A: Pt reports letting her arm rest this weekend and having no increased pain, upon returning to work this am with positioning at computer pt has increased pain again. Continued with manual therapy to address fascial restrictions and passive stretching. Added shoulder stretching this session. Continued with scapular strengthening with ball on the wall. Verbal cuing for form and technique.    Body Structure / Function / Physical  Skills  ADL;Muscle spasms;UE functional use;Fascial restriction;Pain;Strength;ROM    Plan  P: Follow up on shoulder stretching, resume shoulder ROM/strengthening exercises       Patient will benefit from skilled therapeutic intervention in order to improve the following deficits and impairments:   Body Structure / Function / Physical Skills: ADL, Muscle spasms, UE functional use, Fascial restriction, Pain, Strength, ROM       Visit Diagnosis: Other symptoms and signs involving the musculoskeletal system  Stiffness of right shoulder, not elsewhere classified  Acute pain of right shoulder    Problem List Patient Active Problem List   Diagnosis Date Noted  . Closed fracture of right proximal humerus 11/04/2018  . Primary osteoarthritis involving multiple joints 09/12/2018  . Morbid obesity ([place]) 03/29/2018  . Severe aortic stenosis   . Iron deficiency anemia 10/13/2017  . Anxiety and depression 04/28/2017  . Encounter for long-term opiate analgesic use 04/28/2017  . Psychophysiological insomnia 04/28/2017  . OA (osteoarthritis) of hip 03/22/2016  .  Chronic right hip pain 10/29/2015  . Chronic kidney disease (CKD) stage G3b/A1, moderately decreased glomerular filtration rate (GFR) between 30-44 mL/min/1.73 square meter and albuminuria creatinine ratio less than 30 mg/g 06/03/2015  . Type 2 diabetes mellitus ([place]) 11/09/2014  . Diastolic dysfunction, left ventricle 05/16/2013  . Calcific aortic stenosis 05/16/2013  . Gout 05/12/2013  . Diabetic retinopathy ([place]) 12/17/2012  . Hypertension 06/10/2012  . Hyperlipidemia 06/10/2012  . Hypothyroidism 06/10/2012   Guadelupe Sabin, OTR/L  [phone] 03/10/2019, 2:16 PM  [place] [address] [place], [name], [address] Phone: [phone]   Fax:  [phone]  Name: KATELEEN WILDS MRN: [id number] Date of Birth: 1951-01-19

## 2019-03-10 NOTE — Patient Instructions (Signed)
  1) Flexion Wall Stretch    Face wall, place affected handon wall in front of you. Slide hand up the wall  and lean body in towards the wall. Hold for 10 seconds. Repeat 3-5 times. 1-2 times/day.     2) Towel Stretch with Internal Rotation   Or     Gently pull up (or to the side) your affected arm  behind your back with the assist of a towel. Hold 10 seconds, repeat 3-5 times. 1-2 times/day.             3) Corner Stretch    Stand at a corner of a wall, place your arms on the walls with elbows bent. Lean into the corner until a stretch is felt along the front of your chest and/or shoulders. Hold for 10 seconds. Repeat 3-5X, 1-2 times/day.    4) Posterior Capsule Stretch    Bring the involved arm across chest. Grasp elbow and pull toward chest until you feel a stretch in the back of the upper arm and shoulder. Hold 10 seconds. Repeat 3-5X. Complete 1-2 times/day.    5) Scapular Retraction    Tuck chin back as you pinch shoulder blades together.  Hold 5 seconds. Repeat 3-5X. Complete 1-2 times/day.    6) External Rotation Stretch:     Place your affected hand on the wall with the elbow bent and gently turn your body the opposite direction until a stretch is felt. Hold 10 seconds, repeat 3-5X. Complete 1-2 times/day.     

## 2019-03-11 ENCOUNTER — Other Ambulatory Visit: Payer: Self-pay | Admitting: Family Medicine

## 2019-03-12 ENCOUNTER — Encounter (HOSPITAL_COMMUNITY): Payer: Self-pay | Admitting: Occupational Therapy

## 2019-03-12 ENCOUNTER — Ambulatory Visit (HOSPITAL_COMMUNITY): Payer: Worker's Compensation | Admitting: Occupational Therapy

## 2019-03-12 ENCOUNTER — Other Ambulatory Visit: Payer: Self-pay

## 2019-03-12 DIAGNOSIS — M25511 Pain in right shoulder: Secondary | ICD-10-CM

## 2019-03-12 DIAGNOSIS — M25611 Stiffness of right shoulder, not elsewhere classified: Secondary | ICD-10-CM

## 2019-03-12 DIAGNOSIS — R29898 Other symptoms and signs involving the musculoskeletal system: Secondary | ICD-10-CM | POA: Diagnosis not present

## 2019-03-12 NOTE — Therapy (Signed)
[place], [name], [address] Phone: [phone]   Fax:  [phone]  Occupational Therapy Treatment  Patient Details  Name: Sabrina Deleon MRN: [id number] Date of Birth: 16-Apr-1950 Referring Provider (OT): Dr. Jeannie Fend   Encounter Date: 03/12/2019  OT End of Session - 03/12/19 1724    Visit Number  23    Number of Visits  24    Date for OT Re-Evaluation  03/18/19    Authorization Type  workers compensation 12 additional visits authorized    Authorization - Visit Number  10    Authorization - Number of Visits  12    OT Start Time  [id number]    OT Stop Time  [id number]    OT Time Calculation (min)  39 min    Activity Tolerance  Patient tolerated treatment well    Behavior During Therapy  [facility] for tasks assessed/performed       Past Medical History:  Diagnosis Date  . Anxiety   . Aortic stenosis   . Arthritis   . Chronic kidney disease    Dr. Tami Ribas- noted increase kidney function levels- took pt off Metformin and started on Onglyza for Blood sugar control-pt being followed for this.  Marland Kitchen GERD (gastroesophageal reflux disease)   . Gout   . Heart murmur   . Hypertension   . Hypothyroidism   . Morbid obesity ([place]) 03/29/2018   Patient has elevated BMI with diabetes hyperlipidemia and hypertension  . Pancreatitis   . Type 2 diabetes mellitus ([place])   . Wears glasses   . Wears partial dentures    Upper    Past Surgical History:  Procedure Laterality Date  . BACK SURGERY     Lumbar  . CARPAL TUNNEL RELEASE  11/02/2011   Procedure: CARPAL TUNNEL RELEASE;  Surgeon: Tennis Must, MD;  Location: [place];  Service: Orthopedics;  Laterality: Right;  . CARPAL TUNNEL RELEASE Left 01/14/2015   Procedure: LEFT CARPAL TUNNEL RELEASE;  Surgeon: Leanora Cover, MD;  Location: [place];  Service: Orthopedics;  Laterality: Left;  . CHOLECYSTECTOMY     laparoscopic  . COLONOSCOPY    .  COLONOSCOPY N/A 11/12/2015   Procedure: COLONOSCOPY;  Surgeon: Rogene Houston, MD;  [facility];  Service: Endoscopy;  Laterality: N/A;  9:30  . ESOPHAGOGASTRODUODENOSCOPY N/A 11/12/2015   Procedure: ESOPHAGOGASTRODUODENOSCOPY (EGD);  Surgeon: Rogene Houston, MD;  [facility];  Service: Endoscopy;  Laterality: N/A;  . REVERSE SHOULDER ARTHROPLASTY Right 11/04/2018  . REVERSE SHOULDER ARTHROPLASTY Right 11/04/2018   Procedure: REVERSE SHOULDER ARTHROPLASTY;  Surgeon: Verner Mould, MD;  Location: [place];  Service: Orthopedics;  Laterality: Right;  . RIGHT/LEFT HEART CATH AND CORONARY ANGIOGRAPHY N/A 10/18/2017   Procedure: RIGHT/LEFT HEART CATH AND CORONARY ANGIOGRAPHY;  Surgeon: Burnell Blanks, MD;  Location: [place] [facility];  Service: Cardiovascular;  Laterality: N/A;  . TOTAL HIP ARTHROPLASTY Left 03/22/2016   Procedure: LEFT TOTAL HIP ARTHROPLASTY ANTERIOR APPROACH;  Surgeon: Gaynelle Arabian, MD;  [facility];  Service: Orthopedics;  Laterality: Left;    There were no vitals filed for this visit.  Subjective Assessment - 03/12/19 1606    Subjective   S: I had to take a painpill because I was moving files and had a catch in my arm again.    Currently in Pain?  Yes    Pain Score  2     Pain  Location  Shoulder    Pain Orientation  Right    Pain Descriptors / Indicators  Aching;Sore    Pain Type  Acute pain    Pain Radiating Towards  down to hand    Pain Onset  More than a month ago    Pain Frequency  Constant    Aggravating Factors   working    Pain Relieving Factors  CBD oil, ADLs    Effect of Pain on Daily Activities  mod effect on ADLs    Multiple Pain Sites  No         [facility] OT Assessment - 03/12/19 1606      Assessment   Medical Diagnosis  S/P Right Reverse Total Shoulder Replacement    Referring Provider (OT)  Dr. Jeannie Fend      Precautions   Precautions  Shoulder    Type of Shoulder Precautions  P/AA/A/ROM with limitations of  130 flexion and 40 external rotation, no strengthening               OT Treatments/Exercises (OP) - 03/12/19 1608      Exercises   Exercises  Shoulder      Shoulder Exercises: Supine   Protraction  PROM;5 reps    Horizontal ABduction  PROM;5 reps    External Rotation  PROM;5 reps    Internal Rotation  PROM;5 reps    Flexion  PROM;5 reps    ABduction  PROM;5 reps      Shoulder Exercises: Sidelying   External Rotation  AROM;12 reps    Internal Rotation  AROM;12 reps    Flexion  AROM;12 reps    ABduction  AROM;12 reps    Other Sidelying Exercises  protraction, A/ROM, 12X    Other Sidelying Exercises  horizontal abduction, A/ROM, 12X      Shoulder Exercises: Standing   Protraction  Strengthening;12 reps    Protraction Weight (lbs)  1    Horizontal ABduction  Strengthening;12 reps    Horizontal ABduction Weight (lbs)  1    External Rotation  Strengthening;12 reps    External Rotation Weight (lbs)  1    Internal Rotation  Strengthening;12 reps    Internal Rotation Weight (lbs)  1    Flexion  Strengthening;12 reps    Shoulder Flexion Weight (lbs)  1    ABduction  Strengthening;12 reps    Shoulder ABduction Weight (lbs)  1      Shoulder Exercises: Stretch   Cross Chest Stretch  3 reps;10 seconds    Internal Rotation Stretch  3 reps   10" horizontal towel   External Rotation Stretch  3 reps;10 seconds    Wall Stretch - Flexion  3 reps;10 seconds    Other Shoulder Stretches  doorway stretch, 3x10"      Manual Therapy   Manual Therapy  Myofascial release    Manual therapy comments  manual therapy completed seperately from all other interventions    Myofascial Release  myofascial release and manual stretching to right upper arm, scapular, and trapezius to decrease fascial and scar restrictions and improve pain free mobility and strength.                 OT Short Term Goals - 02/21/19 0945      OT SHORT TERM GOAL #1   Title  Patient will be educated and  independent with HEP for right arm mobility and strength.    Time  4    Period  Weeks  Target Date  01/22/19      OT SHORT TERM GOAL #2   Title  Patient will improve right shoulder P/ROM to [facility] in order to don shirts and pants with increased independence.    Time  4    Period  Weeks      OT SHORT TERM GOAL #3   Title  Patient will increase right shoulder strength to 3+/5 for increased ability to lift laundry baskets, her dogs, and items at work.    Time  4    Period  Weeks      OT SHORT TERM GOAL #4   Title  Patient will decrease pain in right shoulder to 5/10 or less during completing ADL tasks.    Time  4    Period  Weeks      OT SHORT TERM GOAL #5   Title  Patient will decrease fascial and scar restrictions to moderate in right upper arm for increased mobility and less pain.    Time  4    Period  Weeks        OT Long Term Goals - 02/18/19 1724      OT LONG TERM GOAL #1   Title  Patient will return to prior level of independence and use of right arm as dominant with all B/IADLs, work, and leisure tasks.    Time  8    Period  Weeks    Status  On-going      OT LONG TERM GOAL #2   Title  Patient will increase A/ROM to [facility] in right shoulder for improved ability to comb her hair, tuck shirts in, and reach into overhead cabinets.    Time  8    Period  Weeks    Status  On-going      OT LONG TERM GOAL #3   Title  Patient will increase right shoulder strength to 4/5 or better for improved ability to lift boxes of drinks at work.    Time  8    Period  Weeks    Status  On-going      OT LONG TERM GOAL #4   Title  Patient will decrease right shoulder pain to 2/10 or better for improved ability to complete daily tasks.    Time  8    Period  Weeks    Status  On-going      OT LONG TERM GOAL #5   Title  Patient will decrease fascial restrictions in right shoulder region to minimal for improved ability to complete daily tasks with less pain.    Time  8    Period  Weeks     Status  On-going            Plan - 03/12/19 1624    Clinical Impression Statement  A: Pt reports MD appt went well, MD would like her to continue with therapy and educated on potential limitations with severity of injury and extent of sx. Continued with manual therapy, passive stretching. Added sidelying A/ROM today, pt with ROM at 50%. Continued with strengthening in standing, as well as shoulder stretches. Verbal cuing for form and technique.    Body Structure / Function / Physical Skills  ADL;Muscle spasms;UE functional use;Fascial restriction;Pain;Strength;ROM    Plan  P: Reassessment, recertification, FOTO. Pt will need worker's comp approval for addtional visits       Patient will benefit from skilled therapeutic intervention in order to improve the following deficits and impairments:   Body  Structure / Function / Physical Skills: ADL, Muscle spasms, UE functional use, Fascial restriction, Pain, Strength, ROM       Visit Diagnosis: Other symptoms and signs involving the musculoskeletal system  Stiffness of right shoulder, not elsewhere classified  Acute pain of right shoulder    Problem List Patient Active Problem List   Diagnosis Date Noted  . Closed fracture of right proximal humerus 11/04/2018  . Primary osteoarthritis involving multiple joints 09/12/2018  . Morbid obesity ([place]) 03/29/2018  . Severe aortic stenosis   . Iron deficiency anemia 10/13/2017  . Anxiety and depression 04/28/2017  . Encounter for long-term opiate analgesic use 04/28/2017  . Psychophysiological insomnia 04/28/2017  . OA (osteoarthritis) of hip 03/22/2016  . Chronic right hip pain 10/29/2015  . Chronic kidney disease (CKD) stage G3b/A1, moderately decreased glomerular filtration rate (GFR) between 30-44 mL/min/1.73 square meter and albuminuria creatinine ratio less than 30 mg/g 06/03/2015  . Type 2 diabetes mellitus ([place]) 11/09/2014  . Diastolic dysfunction, left ventricle 05/16/2013  .  Calcific aortic stenosis 05/16/2013  . Gout 05/12/2013  . Diabetic retinopathy ([place]) 12/17/2012  . Hypertension 06/10/2012  . Hyperlipidemia 06/10/2012  . Hypothyroidism 06/10/2012   Guadelupe Sabin, OTR/L  [phone] 03/12/2019, 5:24 PM  [place] [address] [place], [name], [address] Phone: [phone]   Fax:  [phone]  Name: Sabrina Deleon MRN: [id number] Date of Birth: [birth year]

## 2019-03-18 ENCOUNTER — Other Ambulatory Visit: Payer: Self-pay

## 2019-03-18 ENCOUNTER — Encounter (HOSPITAL_COMMUNITY): Payer: Self-pay | Admitting: Occupational Therapy

## 2019-03-18 ENCOUNTER — Ambulatory Visit (HOSPITAL_COMMUNITY): Payer: Worker's Compensation | Admitting: Occupational Therapy

## 2019-03-18 DIAGNOSIS — M25511 Pain in right shoulder: Secondary | ICD-10-CM

## 2019-03-18 DIAGNOSIS — R29898 Other symptoms and signs involving the musculoskeletal system: Secondary | ICD-10-CM | POA: Diagnosis not present

## 2019-03-18 DIAGNOSIS — M25611 Stiffness of right shoulder, not elsewhere classified: Secondary | ICD-10-CM

## 2019-03-18 NOTE — Therapy (Signed)
[place], [name], [address] Phone: [phone]   Fax:  [phone]  Occupational Therapy Reassessment, Treatment (recertification) Patient Details  Name: Sabrina Deleon MRN: [id number] Date of Birth: 11/05/1950 Referring Provider (OT): Dr. Jeannie Fend   Encounter Date: 03/18/2019  OT End of Session - 03/18/19 1343    Visit Number  24    Number of Visits  32    Date for OT Re-Evaluation  04/17/19    Authorization Type  workers compensation 12 additional visits authorized; requesting 8 additional visits    Authorization - Visit Number  11    Authorization - Number of Visits  12    OT Start Time  1302    OT Stop Time  1345    OT Time Calculation (min)  43 min    Activity Tolerance  Patient tolerated treatment well    Behavior During Therapy  [facility] for tasks assessed/performed       Past Medical History:  Diagnosis Date  . Anxiety   . Aortic stenosis   . Arthritis   . Chronic kidney disease    Dr. Tami Ribas- noted increase kidney function levels- took pt off Metformin and started on Onglyza for Blood sugar control-pt being followed for this.  Marland Kitchen GERD (gastroesophageal reflux disease)   . Gout   . Heart murmur   . Hypertension   . Hypothyroidism   . Morbid obesity ([place]) 03/29/2018   Patient has elevated BMI with diabetes hyperlipidemia and hypertension  . Pancreatitis   . Type 2 diabetes mellitus ([place])   . Wears glasses   . Wears partial dentures    Upper    Past Surgical History:  Procedure Laterality Date  . BACK SURGERY     Lumbar  . CARPAL TUNNEL RELEASE  11/02/2011   Procedure: CARPAL TUNNEL RELEASE;  Surgeon: Tennis Must, MD;  Location: [place];  Service: Orthopedics;  Laterality: Right;  . CARPAL TUNNEL RELEASE Left 01/14/2015   Procedure: LEFT CARPAL TUNNEL RELEASE;  Surgeon: Leanora Cover, MD;  Location: [place];  Service: Orthopedics;  Laterality: Left;  .  CHOLECYSTECTOMY     laparoscopic  . COLONOSCOPY    . COLONOSCOPY N/A 11/12/2015   Procedure: COLONOSCOPY;  Surgeon: Rogene Houston, MD;  [facility];  Service: Endoscopy;  Laterality: N/A;  9:30  . ESOPHAGOGASTRODUODENOSCOPY N/A 11/12/2015   Procedure: ESOPHAGOGASTRODUODENOSCOPY (EGD);  Surgeon: Rogene Houston, MD;  [facility];  Service: Endoscopy;  Laterality: N/A;  . REVERSE SHOULDER ARTHROPLASTY Right 11/04/2018  . REVERSE SHOULDER ARTHROPLASTY Right 11/04/2018   Procedure: REVERSE SHOULDER ARTHROPLASTY;  Surgeon: Verner Mould, MD;  Location: [place];  Service: Orthopedics;  Laterality: Right;  . RIGHT/LEFT HEART CATH AND CORONARY ANGIOGRAPHY N/A 10/18/2017   Procedure: RIGHT/LEFT HEART CATH AND CORONARY ANGIOGRAPHY;  Surgeon: Burnell Blanks, MD;  Location: [place] [facility];  Service: Cardiovascular;  Laterality: N/A;  . TOTAL HIP ARTHROPLASTY Left 03/22/2016   Procedure: LEFT TOTAL HIP ARTHROPLASTY ANTERIOR APPROACH;  Surgeon: Gaynelle Arabian, MD;  [facility];  Service: Orthopedics;  Laterality: Left;    There were no vitals filed for this visit.  Subjective Assessment - 03/18/19 1301    Subjective   S: Those new exercises are hard.    Currently in Pain?  Yes    Pain Score  2     Pain Location  Shoulder    Pain Orientation  Right    Pain Descriptors / Indicators  Aching;Sore    Pain Type  Acute pain    Pain Radiating Towards  down to hand    Pain Onset  More than a month ago    Pain Frequency  Constant    Aggravating Factors   working    Pain Relieving Factors  CBD oil, ADLs    Effect of Pain on Daily Activities  mod effect on ADLs    Multiple Pain Sites  No         [facility] OT Assessment - 03/18/19 1301      Assessment   Medical Diagnosis  S/P Right Reverse Total Shoulder Replacement    Referring Provider (OT)  Dr. Jeannie Fend      Precautions   Precautions  Shoulder    Type of Shoulder Precautions  P/AA/A/ROM with limitations of  130 flexion and 40 external rotation, no strengthening      Observation/Other Assessments   Focus on Therapeutic Outcomes (FOTO)   59/100   50/100 previous     Palpation   Palpation comment  min fascial restrictions in right upper arm and shoulder region      AROM   Overall AROM Comments  assessed in seated, external and internal rotation with arm adducted    AROM Assessment Site  Shoulder    Right/Left Shoulder  Right    Right Shoulder Flexion  122 Degrees   120 previous   Right Shoulder ABduction  120 Degrees   124 previous   Right Shoulder Internal Rotation  90 Degrees   same as previous   Right Shoulder External Rotation  40 Degrees   35 previous     PROM   Overall PROM Comments  assessed in supine, external and internal rotation with shoulder adducted    PROM Assessment Site  Shoulder    Right/Left Shoulder  Right    Right Shoulder Flexion  136 Degrees   132 previous   Right Shoulder ABduction  140 Degrees   124 previous   Right Shoulder Internal Rotation  90 Degrees   same as previous   Right Shoulder External Rotation  38 Degrees   25     Strength   Overall Strength Comments  assessed seated, er/IR adducted    Strength Assessment Site  Shoulder    Right/Left Shoulder  Right    Right Shoulder Flexion  4-/5   3+/5 previous   Right Shoulder ABduction  4+/5   same as previous   Right Shoulder Internal Rotation  4-/5   3+/5 previous   Right Shoulder External Rotation  3+/5   3/5 previous              OT Treatments/Exercises (OP) - 03/18/19 1306      Exercises   Exercises  Shoulder      Shoulder Exercises: Supine   Protraction  PROM;5 reps    Horizontal ABduction  PROM;5 reps    External Rotation  PROM;5 reps    Internal Rotation  PROM;5 reps    Flexion  PROM;5 reps    ABduction  PROM;5 reps      Shoulder Exercises: Standing   Protraction  Strengthening;12 reps    Protraction Weight (lbs)  1    Horizontal ABduction  Strengthening;12 reps     Horizontal ABduction Weight (lbs)  1    External Rotation  Strengthening;12 reps    External Rotation Weight (lbs)  1    Internal Rotation  Strengthening;12 reps  Internal Rotation Weight (lbs)  1    Flexion  Strengthening;12 reps    Shoulder Flexion Weight (lbs)  1    ABduction  Strengthening;12 reps    Shoulder ABduction Weight (lbs)  1      Shoulder Exercises: ROM/Strengthening   X to V Arms  10X, 1# within available ROM     Ball on Wall  1' flexion 1' abduction      Functional Reaching Activities   Mid Level  Pt placed and removed 10 cones from middle shelf of overhead cabinet working on functional reaching required for ADLs      Manual Therapy   Manual Therapy  Myofascial release    Manual therapy comments  manual therapy completed seperately from all other interventions    Myofascial Release  myofascial release and manual stretching to right upper arm, scapular, and trapezius to decrease fascial and scar restrictions and improve pain free mobility and strength.                 OT Short Term Goals - 02/21/19 0945      OT SHORT TERM GOAL #1   Title  Patient will be educated and independent with HEP for right arm mobility and strength.    Time  4    Period  Weeks    Target Date  01/22/19      OT SHORT TERM GOAL #2   Title  Patient will improve right shoulder P/ROM to [facility] in order to don shirts and pants with increased independence.    Time  4    Period  Weeks      OT SHORT TERM GOAL #3   Title  Patient will increase right shoulder strength to 3+/5 for increased ability to lift laundry baskets, her dogs, and items at work.    Time  4    Period  Weeks      OT SHORT TERM GOAL #4   Title  Patient will decrease pain in right shoulder to 5/10 or less during completing ADL tasks.    Time  4    Period  Weeks      OT SHORT TERM GOAL #5   Title  Patient will decrease fascial and scar restrictions to moderate in right upper arm for increased mobility and less pain.     Time  4    Period  Weeks        OT Long Term Goals - 03/18/19 1331      OT LONG TERM GOAL #1   Title  Patient will return to prior level of independence and use of right arm as dominant with all B/IADLs, work, and leisure tasks.    Time  8    Period  Weeks    Status  On-going      OT LONG TERM GOAL #2   Title  Patient will increase A/ROM to [facility] in right shoulder for improved ability to comb her hair, tuck shirts in, and reach into overhead cabinets.    Time  8    Period  Weeks    Status  On-going      OT LONG TERM GOAL #3   Title  Patient will increase right shoulder strength to 4/5 or better for improved ability to lift boxes of drinks at work.    Time  8    Period  Weeks    Status  Partially Met      OT LONG TERM GOAL #4   Title  Patient will decrease right shoulder pain to 2/10 or better for improved ability to complete daily tasks.    Time  8    Period  Weeks    Status  On-going      OT LONG TERM GOAL #5   Title  Patient will decrease fascial restrictions in right shoulder region to minimal for improved ability to complete daily tasks with less pain.    Time  8    Period  Weeks    Status  Achieved            Plan - 03/18/19 1350    Clinical Impression Statement  A: Reassessment completed this session, pt has met all STGs and 1 LTG, has partially met an additional LTG. Pt reports she continues to have catching and pain during work tasks as well as some discomfort during overhead reaching tasks. Lifting is limited due to RUE weakness. She reports she is using her arm more and is working on playing the piano and Financial planner tasks. Pt demonstrates improved ROM and strength, slight decrease in abduction A/ROM today. Discussed progress with pt who is agreeable to plan for focusing on shoulder strength required for ADL and work task completion, as well as leisure activities such as playing the piano.    OT Occupational Profile and History  Problem  Focused Assessment - Including review of records relating to presenting problem    Occupational performance deficits (Please refer to evaluation for details):  ADL's;IADL's;Rest and Sleep;Work;Leisure    Body Structure / Function / Physical Skills  ADL;Muscle spasms;UE functional use;Fascial restriction;Pain;Strength;ROM    Rehab Potential  Good    Clinical Decision Making  Limited treatment options, no task modification necessary    Comorbidities Affecting Occupational Performance:  May have comorbidities impacting occupational performance    Modification or Assistance to Complete Evaluation   No modification of tasks or assist necessary to complete eval    OT Frequency  2x / week    OT Duration  4 weeks    OT Treatment/Interventions  Self-care/ADL training;Electrical Stimulation;Therapeutic exercise;Patient/family education;Neuromuscular education;Moist Heat;Energy conservation;Therapeutic activities;Scar mobilization;Cryotherapy;Ultrasound;DME and/or AE instruction;Manual Therapy;Passive range of motion    Plan  P: Continue with skilled OT services with focus on shoulder strength and stability required for ADL and work task completion, functional reaching tasks. Next session: manual therapy prn, complete shoulder stretches prior to strengthening.       Patient will benefit from skilled therapeutic intervention in order to improve the following deficits and impairments:   Body Structure / Function / Physical Skills: ADL, Muscle spasms, UE functional use, Fascial restriction, Pain, Strength, ROM       Visit Diagnosis: Other symptoms and signs involving the musculoskeletal system  Stiffness of right shoulder, not elsewhere classified  Acute pain of right shoulder    Problem List Patient Active Problem List   Diagnosis Date Noted  . Closed fracture of right proximal humerus 11/04/2018  . Primary osteoarthritis involving multiple joints 09/12/2018  . Morbid obesity ([place]) 03/29/2018   . Severe aortic stenosis   . Iron deficiency anemia 10/13/2017  . Anxiety and depression 04/28/2017  . Encounter for long-term opiate analgesic use 04/28/2017  . Psychophysiological insomnia 04/28/2017  . OA (osteoarthritis) of hip 03/22/2016  . Chronic right hip pain 10/29/2015  . Chronic kidney disease (CKD) stage G3b/A1, moderately decreased glomerular filtration rate (GFR) between 30-44 mL/min/1.73 square meter and albuminuria creatinine ratio less than 30 mg/g 06/03/2015  . Type 2 diabetes mellitus ([place])  11/09/2014  . Diastolic dysfunction, left ventricle 05/16/2013  . Calcific aortic stenosis 05/16/2013  . Gout 05/12/2013  . Diabetic retinopathy ([place]) 12/17/2012  . Hypertension 06/10/2012  . Hyperlipidemia 06/10/2012  . Hypothyroidism 06/10/2012   Guadelupe Sabin, OTR/L  [phone] 03/18/2019, 1:54 PM  [place] [address] [place], [name], [address] Phone: [phone]   Fax:  [phone]  Name: Sabrina Deleon MRN: [id number] Date of Birth: [birth year]

## 2019-03-20 ENCOUNTER — Other Ambulatory Visit: Payer: Self-pay

## 2019-03-20 ENCOUNTER — Ambulatory Visit (HOSPITAL_COMMUNITY): Payer: Worker's Compensation | Admitting: Occupational Therapy

## 2019-03-20 ENCOUNTER — Encounter (HOSPITAL_COMMUNITY): Payer: Self-pay | Admitting: Occupational Therapy

## 2019-03-20 DIAGNOSIS — M25511 Pain in right shoulder: Secondary | ICD-10-CM

## 2019-03-20 DIAGNOSIS — M25611 Stiffness of right shoulder, not elsewhere classified: Secondary | ICD-10-CM

## 2019-03-20 DIAGNOSIS — R29898 Other symptoms and signs involving the musculoskeletal system: Secondary | ICD-10-CM | POA: Diagnosis not present

## 2019-03-20 NOTE — Therapy (Signed)
[place], [name], [address] Phone: [phone]   Fax:  [phone]  Occupational Therapy Treatment  Patient Details  Name: Sabrina Deleon MRN: [id number] Date of Birth: 03/15/1951 Referring Provider (OT): Dr. Jeannie Fend   Encounter Date: 03/20/2019  OT End of Session - 03/20/19 1435    Visit Number  25    Number of Visits  32    Date for OT Re-Evaluation  04/17/19    Authorization Type  workers compensation 12 additional visits authorized; requesting 8 additional visits    Authorization - Visit Number  12    Authorization - Number of Visits  12    OT Start Time  1350    OT Stop Time  1430   billable time ending at 1420-see impression statement   OT Time Calculation (min)  40 min    Activity Tolerance  Patient tolerated treatment well    Behavior During Therapy  [facility] for tasks assessed/performed       Past Medical History:  Diagnosis Date  . Anxiety   . Aortic stenosis   . Arthritis   . Chronic kidney disease    Dr. Tami Ribas- noted increase kidney function levels- took pt off Metformin and started on Onglyza for Blood sugar control-pt being followed for this.  Marland Kitchen GERD (gastroesophageal reflux disease)   . Gout   . Heart murmur   . Hypertension   . Hypothyroidism   . Morbid obesity ([place]) 03/29/2018   Patient has elevated BMI with diabetes hyperlipidemia and hypertension  . Pancreatitis   . Type 2 diabetes mellitus ([place])   . Wears glasses   . Wears partial dentures    Upper    Past Surgical History:  Procedure Laterality Date  . BACK SURGERY     Lumbar  . CARPAL TUNNEL RELEASE  11/02/2011   Procedure: CARPAL TUNNEL RELEASE;  Surgeon: Tennis Must, MD;  Location: [place];  Service: Orthopedics;  Laterality: Right;  . CARPAL TUNNEL RELEASE Left 01/14/2015   Procedure: LEFT CARPAL TUNNEL RELEASE;  Surgeon: Leanora Cover, MD;  Location: [place];  Service: Orthopedics;   Laterality: Left;  . CHOLECYSTECTOMY     laparoscopic  . COLONOSCOPY    . COLONOSCOPY N/A 11/12/2015   Procedure: COLONOSCOPY;  Surgeon: Rogene Houston, MD;  [facility];  Service: Endoscopy;  Laterality: N/A;  9:30  . ESOPHAGOGASTRODUODENOSCOPY N/A 11/12/2015   Procedure: ESOPHAGOGASTRODUODENOSCOPY (EGD);  Surgeon: Rogene Houston, MD;  [facility];  Service: Endoscopy;  Laterality: N/A;  . REVERSE SHOULDER ARTHROPLASTY Right 11/04/2018  . REVERSE SHOULDER ARTHROPLASTY Right 11/04/2018   Procedure: REVERSE SHOULDER ARTHROPLASTY;  Surgeon: Verner Mould, MD;  Location: [place];  Service: Orthopedics;  Laterality: Right;  . RIGHT/LEFT HEART CATH AND CORONARY ANGIOGRAPHY N/A 10/18/2017   Procedure: RIGHT/LEFT HEART CATH AND CORONARY ANGIOGRAPHY;  Surgeon: Burnell Blanks, MD;  Location: [place];  Service: Cardiovascular;  Laterality: N/A;  . TOTAL HIP ARTHROPLASTY Left 03/22/2016   Procedure: LEFT TOTAL HIP ARTHROPLASTY ANTERIOR APPROACH;  Surgeon: Gaynelle Arabian, MD;  [facility];  Service: Orthopedics;  Laterality: Left;    There were no vitals filed for this visit.  Subjective Assessment - 03/20/19 1352    Subjective   S: My arm is hurting a little bit today.    Currently in Pain?  Yes    Pain Score  2  Pain Location  Shoulder    Pain Orientation  Right    Pain Descriptors / Indicators  Aching;Sore    Pain Type  Acute pain    Pain Radiating Towards  down to hand    Pain Onset  More than a month ago    Pain Frequency  Constant    Aggravating Factors   working    Pain Relieving Factors  CBD oil, heat    Effect of Pain on Daily Activities  mod effect on ADLs    Multiple Pain Sites  No         [facility] OT Assessment - 03/20/19 1352      Assessment   Medical Diagnosis  S/P Right Reverse Total Shoulder Replacement    Referring Provider (OT)  Dr. Jeannie Fend      Precautions   Precautions  Shoulder    Type of Shoulder Precautions   P/AA/A/ROM with limitations of 130 flexion and 40 external rotation, no strengthening               OT Treatments/Exercises (OP) - 03/20/19 1353      Exercises   Exercises  Shoulder      Shoulder Exercises: Supine   Protraction  PROM;5 reps    Horizontal ABduction  PROM;5 reps    External Rotation  PROM;5 reps    Internal Rotation  PROM;5 reps    Flexion  PROM;5 reps    ABduction  PROM;5 reps      Shoulder Exercises: Standing   Protraction  Strengthening;12 reps    Protraction Weight (lbs)  1    Horizontal ABduction  Strengthening;12 reps    Horizontal ABduction Weight (lbs)  1    External Rotation  Strengthening;12 reps    External Rotation Weight (lbs)  1    Internal Rotation  Strengthening;12 reps    Internal Rotation Weight (lbs)  1    Flexion  Strengthening;12 reps    Shoulder Flexion Weight (lbs)  1    ABduction  Strengthening;12 reps    Shoulder ABduction Weight (lbs)  1      Shoulder Exercises: ROM/Strengthening   X to V Arms  10X, 1# within available ROM       Shoulder Exercises: Stretch   Cross Chest Stretch  3 reps;20 seconds    Internal Rotation Stretch  3 reps   20"   External Rotation Stretch  3 reps;20 seconds    Wall Stretch - Flexion  3 reps;20 seconds    Other Shoulder Stretches  doorway stretch, 3x20"               OT Short Term Goals - 02/21/19 0945      OT SHORT TERM GOAL #1   Title  Patient will be educated and independent with HEP for right arm mobility and strength.    Time  4    Period  Weeks    Target Date  01/22/19      OT SHORT TERM GOAL #2   Title  Patient will improve right shoulder P/ROM to [facility] in order to don shirts and pants with increased independence.    Time  4    Period  Weeks      OT SHORT TERM GOAL #3   Title  Patient will increase right shoulder strength to 3+/5 for increased ability to lift laundry baskets, her dogs, and items at work.    Time  4    Period  Weeks  OT SHORT TERM GOAL #4   Title   Patient will decrease pain in right shoulder to 5/10 or less during completing ADL tasks.    Time  4    Period  Weeks      OT SHORT TERM GOAL #5   Title  Patient will decrease fascial and scar restrictions to moderate in right upper arm for increased mobility and less pain.    Time  4    Period  Weeks        OT Long Term Goals - 03/18/19 1331      OT LONG TERM GOAL #1   Title  Patient will return to prior level of independence and use of right arm as dominant with all B/IADLs, work, and leisure tasks.    Time  8    Period  Weeks    Status  On-going      OT LONG TERM GOAL #2   Title  Patient will increase A/ROM to [facility] in right shoulder for improved ability to comb her hair, tuck shirts in, and reach into overhead cabinets.    Time  8    Period  Weeks    Status  On-going      OT LONG TERM GOAL #3   Title  Patient will increase right shoulder strength to 4/5 or better for improved ability to lift boxes of drinks at work.    Time  8    Period  Weeks    Status  Partially Met      OT LONG TERM GOAL #4   Title  Patient will decrease right shoulder pain to 2/10 or better for improved ability to complete daily tasks.    Time  8    Period  Weeks    Status  On-going      OT LONG TERM GOAL #5   Title  Patient will decrease fascial restrictions in right shoulder region to minimal for improved ability to complete daily tasks with less pain.    Time  8    Period  Weeks    Status  Achieved            Plan - 03/20/19 1413    Clinical Impression Statement  A: No manual therapy completed this session, activities focusing on shoulder stretching and RUE strengthening. Increased stretching holds to 20", pt reports she holds 20-30" at home when completing HEP. Verbal cuing for form and technique.  Ended session early due to blood sugar drop & pt feeling shaky. Provided with water and crackers, improved & feeling better by end of session.    Body Structure / Function / Physical Skills   ADL;Muscle spasms;UE functional use;Fascial restriction;Pain;Strength;ROM    Plan  P: Resume sidelying exercises, attempt Y lift off; add ball roll on wall working on strengthening       Patient will benefit from skilled therapeutic intervention in order to improve the following deficits and impairments:   Body Structure / Function / Physical Skills: ADL, Muscle spasms, UE functional use, Fascial restriction, Pain, Strength, ROM       Visit Diagnosis: Other symptoms and signs involving the musculoskeletal system  Stiffness of right shoulder, not elsewhere classified  Acute pain of right shoulder    Problem List Patient Active Problem List   Diagnosis Date Noted  . Closed fracture of right proximal humerus 11/04/2018  . Primary osteoarthritis involving multiple joints 09/12/2018  . Morbid obesity ([place]) 03/29/2018  . Severe aortic stenosis   .  Iron deficiency anemia 10/13/2017  . Anxiety and depression 04/28/2017  . Encounter for long-term opiate analgesic use 04/28/2017  . Psychophysiological insomnia 04/28/2017  . OA (osteoarthritis) of hip 03/22/2016  . Chronic right hip pain 10/29/2015  . Chronic kidney disease (CKD) stage G3b/A1, moderately decreased glomerular filtration rate (GFR) between 30-44 mL/min/1.73 square meter and albuminuria creatinine ratio less than 30 mg/g 06/03/2015  . Type 2 diabetes mellitus ([place]) 11/09/2014  . Diastolic dysfunction, left ventricle 05/16/2013  . Calcific aortic stenosis 05/16/2013  . Gout 05/12/2013  . Diabetic retinopathy ([place]) 12/17/2012  . Hypertension 06/10/2012  . Hyperlipidemia 06/10/2012  . Hypothyroidism 06/10/2012   Guadelupe Sabin, OTR/L  [phone] 03/20/2019, 2:36 PM  [facility] [address] [place], [name], [address] Phone: [phone]   Fax:  [phone]  Name: KAMRYNN MELOTT MRN: [id number] Date of Birth: December 15, 1950

## 2019-03-25 ENCOUNTER — Other Ambulatory Visit: Payer: Self-pay

## 2019-03-25 ENCOUNTER — Ambulatory Visit (HOSPITAL_COMMUNITY): Payer: Worker's Compensation | Attending: Family Medicine | Admitting: Occupational Therapy

## 2019-03-25 ENCOUNTER — Encounter (HOSPITAL_COMMUNITY): Payer: Self-pay | Admitting: Occupational Therapy

## 2019-03-25 DIAGNOSIS — M25611 Stiffness of right shoulder, not elsewhere classified: Secondary | ICD-10-CM | POA: Diagnosis present

## 2019-03-25 DIAGNOSIS — M25511 Pain in right shoulder: Secondary | ICD-10-CM | POA: Diagnosis present

## 2019-03-25 DIAGNOSIS — R29898 Other symptoms and signs involving the musculoskeletal system: Secondary | ICD-10-CM | POA: Diagnosis present

## 2019-03-25 NOTE — Therapy (Signed)
[place], [name], [address] Phone: [phone]   Fax:  [phone]  Occupational Therapy Treatment  Patient Details  Name: Sabrina Deleon MRN: [id number] Date of Birth: 07/17/1950 Referring Provider (OT): Dr. Jeannie Fend   Encounter Date: 03/25/2019  OT End of Session - 03/25/19 1202    Visit Number  26    Number of Visits  32    Date for OT Re-Evaluation  04/17/19    Authorization Type  workers compensation 8 additional visits authorized on 03/25/2019    Authorization - Visit Number  1    Authorization - Number of Visits  8    OT Start Time  1120    OT Stop Time  1200    OT Time Calculation (min)  40 min    Activity Tolerance  Patient tolerated treatment well    Behavior During Therapy  [facility] for tasks assessed/performed       Past Medical History:  Diagnosis Date  . Anxiety   . Aortic stenosis   . Arthritis   . Chronic kidney disease    Dr. Tami Ribas- noted increase kidney function levels- took pt off Metformin and started on Onglyza for Blood sugar control-pt being followed for this.  Marland Kitchen GERD (gastroesophageal reflux disease)   . Gout   . Heart murmur   . Hypertension   . Hypothyroidism   . Morbid obesity ([place]) 03/29/2018   Patient has elevated BMI with diabetes hyperlipidemia and hypertension  . Pancreatitis   . Type 2 diabetes mellitus ([place])   . Wears glasses   . Wears partial dentures    Upper    Past Surgical History:  Procedure Laterality Date  . BACK SURGERY     Lumbar  . CARPAL TUNNEL RELEASE  11/02/2011   Procedure: CARPAL TUNNEL RELEASE;  Surgeon: Tennis Must, MD;  Location: [place];  Service: Orthopedics;  Laterality: Right;  . CARPAL TUNNEL RELEASE Left 01/14/2015   Procedure: LEFT CARPAL TUNNEL RELEASE;  Surgeon: Leanora Cover, MD;  Location: [place];  Service: Orthopedics;  Laterality: Left;  . CHOLECYSTECTOMY     laparoscopic  . COLONOSCOPY    .  COLONOSCOPY N/A 11/12/2015   Procedure: COLONOSCOPY;  Surgeon: Rogene Houston, MD;  [facility];  Service: Endoscopy;  Laterality: N/A;  9:30  . ESOPHAGOGASTRODUODENOSCOPY N/A 11/12/2015   Procedure: ESOPHAGOGASTRODUODENOSCOPY (EGD);  Surgeon: Rogene Houston, MD;  [facility];  Service: Endoscopy;  Laterality: N/A;  . REVERSE SHOULDER ARTHROPLASTY Right 11/04/2018  . REVERSE SHOULDER ARTHROPLASTY Right 11/04/2018   Procedure: REVERSE SHOULDER ARTHROPLASTY;  Surgeon: Verner Mould, MD;  Location: [place];  Service: Orthopedics;  Laterality: Right;  . RIGHT/LEFT HEART CATH AND CORONARY ANGIOGRAPHY N/A 10/18/2017   Procedure: RIGHT/LEFT HEART CATH AND CORONARY ANGIOGRAPHY;  Surgeon: Burnell Blanks, MD;  Location: [place] [facility];  Service: Cardiovascular;  Laterality: N/A;  . TOTAL HIP ARTHROPLASTY Left 03/22/2016   Procedure: LEFT TOTAL HIP ARTHROPLASTY ANTERIOR APPROACH;  Surgeon: Gaynelle Arabian, MD;  [facility];  Service: Orthopedics;  Laterality: Left;    There were no vitals filed for this visit.  Subjective Assessment - 03/25/19 1119    Subjective   S: No pain right now.    Currently in Pain?  No/denies         [facility] OT Assessment - 03/25/19 1119      Assessment   Medical Diagnosis  S/P Right Reverse Total Shoulder Replacement      Precautions   Precautions  Shoulder    Type of Shoulder Precautions  Progress as tolerated               OT Treatments/Exercises (OP) - 03/25/19 1121      Exercises   Exercises  Shoulder      Shoulder Exercises: Supine   Protraction  PROM;5 reps    Horizontal ABduction  PROM;5 reps    External Rotation  PROM;5 reps    Internal Rotation  PROM;5 reps    Flexion  PROM;5 reps    ABduction  PROM;5 reps      Shoulder Exercises: Sidelying   External Rotation  AROM;12 reps    Internal Rotation  AROM;12 reps    Flexion  AROM;12 reps    ABduction  AROM;12 reps    Other Sidelying Exercises   protraction, A/ROM, 12X    Other Sidelying Exercises  horizontal abduction, A/ROM, 12X      Shoulder Exercises: Therapy Ball   Other Therapy Ball Exercises  green therapy ball: chest press, flexion, circles each directio, 12X each      Shoulder Exercises: ROM/Strengthening   Other ROM/Strengthening Exercises  Pt rolled green therapy ball up wall into shoulder flexion using RUE only. 10X with 2 rest breaks      Shoulder Exercises: Stretch   Cross Chest Stretch  2 reps;30 seconds    External Rotation Stretch  2 reps;30 seconds    Wall Stretch - Flexion  2 reps;30 seconds      Functional Reaching Activities   Mid Level  Pt placed and removed 10 cones from middle shelf of overhead cabinet using 1# wrist weight, working on functional reaching required for ADLs               OT Short Term Goals - 02/21/19 0945      OT SHORT TERM GOAL #1   Title  Patient will be educated and independent with HEP for right arm mobility and strength.    Time  4    Period  Weeks    Target Date  01/22/19      OT SHORT TERM GOAL #2   Title  Patient will improve right shoulder P/ROM to [facility] in order to don shirts and pants with increased independence.    Time  4    Period  Weeks      OT SHORT TERM GOAL #3   Title  Patient will increase right shoulder strength to 3+/5 for increased ability to lift laundry baskets, her dogs, and items at work.    Time  4    Period  Weeks      OT SHORT TERM GOAL #4   Title  Patient will decrease pain in right shoulder to 5/10 or less during completing ADL tasks.    Time  4    Period  Weeks      OT SHORT TERM GOAL #5   Title  Patient will decrease fascial and scar restrictions to moderate in right upper arm for increased mobility and less pain.    Time  4    Period  Weeks        OT Long Term Goals - 03/18/19 1331      OT LONG TERM GOAL #1   Title  Patient will return to prior level of independence and use of right arm as dominant with all B/IADLs, work, and  leisure tasks.  Time  8    Period  Weeks    Status  On-going      OT LONG TERM GOAL #2   Title  Patient will increase A/ROM to [facility] in right shoulder for improved ability to comb her hair, tuck shirts in, and reach into overhead cabinets.    Time  8    Period  Weeks    Status  On-going      OT LONG TERM GOAL #3   Title  Patient will increase right shoulder strength to 4/5 or better for improved ability to lift boxes of drinks at work.    Time  8    Period  Weeks    Status  Partially Met      OT LONG TERM GOAL #4   Title  Patient will decrease right shoulder pain to 2/10 or better for improved ability to complete daily tasks.    Time  8    Period  Weeks    Status  On-going      OT LONG TERM GOAL #5   Title  Patient will decrease fascial restrictions in right shoulder region to minimal for improved ability to complete daily tasks with less pain.    Time  8    Period  Weeks    Status  Achieved            Plan - 03/25/19 1151    Clinical Impression Statement  A: No manual therapy completed this session, exercises focusing on strengthening of RUE. Resumed sidelying exercises today, added therapy ball strengthening and ball roll up wall. Also added 1# wrist weight to functional reaching task. Occasional rest breaks provided during session for fatigue. Verbal cuing required for form and technique.    Body Structure / Function / Physical Skills  ADL;Muscle spasms;UE functional use;Fascial restriction;Pain;Strength;ROM    Plan  P: Continue with therapy ball strengthening tasks, ball rolling, add wrist weight to overhead lacing with shoulder at 90 degrees       Patient will benefit from skilled therapeutic intervention in order to improve the following deficits and impairments:   Body Structure / Function / Physical Skills: ADL, Muscle spasms, UE functional use, Fascial restriction, Pain, Strength, ROM       Visit Diagnosis: Other symptoms and signs involving the  musculoskeletal system  Stiffness of right shoulder, not elsewhere classified  Acute pain of right shoulder    Problem List Patient Active Problem List   Diagnosis Date Noted  . Closed fracture of right proximal humerus 11/04/2018  . Primary osteoarthritis involving multiple joints 09/12/2018  . Morbid obesity ([place]) 03/29/2018  . Severe aortic stenosis   . Iron deficiency anemia 10/13/2017  . Anxiety and depression 04/28/2017  . Encounter for long-term opiate analgesic use 04/28/2017  . Psychophysiological insomnia 04/28/2017  . OA (osteoarthritis) of hip 03/22/2016  . Chronic right hip pain 10/29/2015  . Chronic kidney disease (CKD) stage G3b/A1, moderately decreased glomerular filtration rate (GFR) between 30-44 mL/min/1.73 square meter and albuminuria creatinine ratio less than 30 mg/g 06/03/2015  . Type 2 diabetes mellitus ([place]) 11/09/2014  . Diastolic dysfunction, left ventricle 05/16/2013  . Calcific aortic stenosis 05/16/2013  . Gout 05/12/2013  . Diabetic retinopathy ([place]) 12/17/2012  . Hypertension 06/10/2012  . Hyperlipidemia 06/10/2012  . Hypothyroidism 06/10/2012   Guadelupe Sabin, OTR/L  [phone] 03/25/2019, 12:03 PM  [place] [address] [place], [name], [address] Phone: [phone]   Fax:  [phone]  Name:  Sabrina Deleon MRN: [id number] Date of Birth: 1950/10/17

## 2019-03-27 ENCOUNTER — Encounter (HOSPITAL_COMMUNITY): Payer: Self-pay | Admitting: Occupational Therapy

## 2019-03-27 ENCOUNTER — Ambulatory Visit (HOSPITAL_COMMUNITY): Payer: Worker's Compensation | Admitting: Occupational Therapy

## 2019-03-27 ENCOUNTER — Other Ambulatory Visit: Payer: Self-pay

## 2019-03-27 DIAGNOSIS — R29898 Other symptoms and signs involving the musculoskeletal system: Secondary | ICD-10-CM | POA: Diagnosis not present

## 2019-03-27 DIAGNOSIS — M25611 Stiffness of right shoulder, not elsewhere classified: Secondary | ICD-10-CM

## 2019-03-27 DIAGNOSIS — M25511 Pain in right shoulder: Secondary | ICD-10-CM

## 2019-03-27 NOTE — Therapy (Signed)
[place], [name], [address] Phone: [phone]   Fax:  [phone]  Occupational Therapy Treatment  Patient Details  Name: Sabrina Deleon MRN: [id number] Date of Birth: 27-May-1950 Referring Provider (OT): Dr. Jeannie Fend   Encounter Date: 03/27/2019  OT End of Session - 03/27/19 1610    Visit Number  27    Number of Visits  32    Date for OT Re-Evaluation  04/17/19    Authorization Type  workers compensation 8 additional visits authorized on 03/25/2019    Authorization - Visit Number  2    Authorization - Number of Visits  8    OT Start Time  1434    OT Stop Time  1516    OT Time Calculation (min)  42 min    Activity Tolerance  Patient tolerated treatment well    Behavior During Therapy  [facility] for tasks assessed/performed       Past Medical History:  Diagnosis Date  . Anxiety   . Aortic stenosis   . Arthritis   . Chronic kidney disease    Dr. Tami Ribas- noted increase kidney function levels- took pt off Metformin and started on Onglyza for Blood sugar control-pt being followed for this.  Marland Kitchen GERD (gastroesophageal reflux disease)   . Gout   . Heart murmur   . Hypertension   . Hypothyroidism   . Morbid obesity ([place]) 03/29/2018   Patient has elevated BMI with diabetes hyperlipidemia and hypertension  . Pancreatitis   . Type 2 diabetes mellitus ([place])   . Wears glasses   . Wears partial dentures    Upper    Past Surgical History:  Procedure Laterality Date  . BACK SURGERY     Lumbar  . CARPAL TUNNEL RELEASE  11/02/2011   Procedure: CARPAL TUNNEL RELEASE;  Surgeon: Tennis Must, MD;  Location: [place];  Service: Orthopedics;  Laterality: Right;  . CARPAL TUNNEL RELEASE Left 01/14/2015   Procedure: LEFT CARPAL TUNNEL RELEASE;  Surgeon: Leanora Cover, MD;  Location: [place];  Service: Orthopedics;  Laterality: Left;  . CHOLECYSTECTOMY     laparoscopic  . COLONOSCOPY    .  COLONOSCOPY N/A 11/12/2015   Procedure: COLONOSCOPY;  Surgeon: Rogene Houston, MD;  [facility];  Service: Endoscopy;  Laterality: N/A;  9:30  . ESOPHAGOGASTRODUODENOSCOPY N/A 11/12/2015   Procedure: ESOPHAGOGASTRODUODENOSCOPY (EGD);  Surgeon: Rogene Houston, MD;  [facility];  Service: Endoscopy;  Laterality: N/A;  . REVERSE SHOULDER ARTHROPLASTY Right 11/04/2018  . REVERSE SHOULDER ARTHROPLASTY Right 11/04/2018   Procedure: REVERSE SHOULDER ARTHROPLASTY;  Surgeon: Verner Mould, MD;  Location: [place];  Service: Orthopedics;  Laterality: Right;  . RIGHT/LEFT HEART CATH AND CORONARY ANGIOGRAPHY N/A 10/18/2017   Procedure: RIGHT/LEFT HEART CATH AND CORONARY ANGIOGRAPHY;  Surgeon: Burnell Blanks, MD;  Location: [place] [facility];  Service: Cardiovascular;  Laterality: N/A;  . TOTAL HIP ARTHROPLASTY Left 03/22/2016   Procedure: LEFT TOTAL HIP ARTHROPLASTY ANTERIOR APPROACH;  Surgeon: Gaynelle Arabian, MD;  [facility];  Service: Orthopedics;  Laterality: Left;    There were no vitals filed for this visit.  Subjective Assessment - 03/27/19 1434    Subjective   S: I've been cleaning today and I couldn't reach the top cabinet in the bathroom at work.    Currently in Pain?  Yes    Pain Score  2     Pain Location  Shoulder    Pain Orientation  Right    Pain Descriptors / Indicators  Aching;Sore    Pain Type  Acute pain    Pain Radiating Towards  down to hand    Pain Onset  More than a month ago    Pain Frequency  Constant    Aggravating Factors   working    Pain Relieving Factors  heat, rest    Effect of Pain on Daily Activities  mod effect on ADLs    Multiple Pain Sites  No         [facility] OT Assessment - 03/27/19 1433      Assessment   Medical Diagnosis  S/P Right Reverse Total Shoulder Replacement      Precautions   Precautions  Shoulder    Type of Shoulder Precautions  Progress as tolerated               OT Treatments/Exercises  (OP) - 03/27/19 1436      Exercises   Exercises  Shoulder      Shoulder Exercises: Supine   Protraction  PROM;5 reps    Horizontal ABduction  PROM;5 reps    External Rotation  PROM;5 reps    Internal Rotation  PROM;5 reps    Flexion  PROM;5 reps    ABduction  PROM;5 reps      Shoulder Exercises: Sidelying   External Rotation  Strengthening;12 reps    External Rotation Weight (lbs)  1    Internal Rotation  Strengthening;12 reps    Internal Rotation Weight (lbs)  1    Flexion  Strengthening;12 reps    Flexion Weight (lbs)  1    ABduction  Strengthening;12 reps    ABduction Weight (lbs)  1    Other Sidelying Exercises  protraction, 12X, 1#    Other Sidelying Exercises  horizontal abduction, 12X, 1#      Shoulder Exercises: Therapy Ball   Other Therapy Ball Exercises  green therapy ball: chest press, flexion, circles each direction, 15X each      Shoulder Exercises: ROM/Strengthening   UBE (Upper Arm Bike)  Level 2 3' forward 3' reverse   pace: 5/0   Over Head Lace  2', 1# wrist weight, sitting with arms straight     Other ROM/Strengthening Exercises  Pt rolled green therapy ball up wall into shoulder flexion using RUE only. 10X with 2 rest breaks      Functional Reaching Activities   Mid Level  Pt placed and removed clothespins from pinch tree while wearing 1# wrist weight. Placed clothespins to slightly above shoulder level.                OT Short Term Goals - 02/21/19 0945      OT SHORT TERM GOAL #1   Title  Patient will be educated and independent with HEP for right arm mobility and strength.    Time  4    Period  Weeks    Target Date  01/22/19      OT SHORT TERM GOAL #2   Title  Patient will improve right shoulder P/ROM to [facility] in order to don shirts and pants with increased independence.    Time  4    Period  Weeks      OT SHORT TERM GOAL #3   Title  Patient will increase right shoulder strength to 3+/5 for increased ability to lift laundry baskets, her  dogs, and items at work.    Time  4    Period  Weeks      OT SHORT TERM GOAL #4   Title  Patient will decrease pain in right shoulder to 5/10 or less during completing ADL tasks.    Time  4    Period  Weeks      OT SHORT TERM GOAL #5   Title  Patient will decrease fascial and scar restrictions to moderate in right upper arm for increased mobility and less pain.    Time  4    Period  Weeks        OT Long Term Goals - 03/18/19 1331      OT LONG TERM GOAL #1   Title  Patient will return to prior level of independence and use of right arm as dominant with all B/IADLs, work, and leisure tasks.    Time  8    Period  Weeks    Status  On-going      OT LONG TERM GOAL #2   Title  Patient will increase A/ROM to [facility] in right shoulder for improved ability to comb her hair, tuck shirts in, and reach into overhead cabinets.    Time  8    Period  Weeks    Status  On-going      OT LONG TERM GOAL #3   Title  Patient will increase right shoulder strength to 4/5 or better for improved ability to lift boxes of drinks at work.    Time  8    Period  Weeks    Status  Partially Met      OT LONG TERM GOAL #4   Title  Patient will decrease right shoulder pain to 2/10 or better for improved ability to complete daily tasks.    Time  8    Period  Weeks    Status  On-going      OT LONG TERM GOAL #5   Title  Patient will decrease fascial restrictions in right shoulder region to minimal for improved ability to complete daily tasks with less pain.    Time  8    Period  Weeks    Status  Achieved            Plan - 03/27/19 1611    Clinical Impression Statement  A: Pt reports cleaning and using her arm more today, experiencing some fatigue. Session focusing on strengthening RUE within available ROM. Pt completing functional reaching task using 1# weight to complete pinch tree, also added 1# weight to overhead lacing task. Increased therapy ball repetitions to 15, occasional rest breaks provided  for fatigue. Verbal cuing for form and technique.    Body Structure / Function / Physical Skills  ADL;Muscle spasms;UE functional use;Fascial restriction;Pain;Strength;ROM    Plan  P: Continue with strengthening within available ROM to improve performance of functional tasks       Patient will benefit from skilled therapeutic intervention in order to improve the following deficits and impairments:   Body Structure / Function / Physical Skills: ADL, Muscle spasms, UE functional use, Fascial restriction, Pain, Strength, ROM       Visit Diagnosis: Other symptoms and signs involving the musculoskeletal system  Stiffness of right shoulder, not elsewhere classified  Acute pain of right shoulder    Problem List Patient Active Problem List   Diagnosis Date Noted  . Closed fracture of right proximal humerus 11/04/2018  . Primary osteoarthritis involving multiple joints 09/12/2018  . Morbid obesity ([place]) 03/29/2018  . Severe  aortic stenosis   . Iron deficiency anemia 10/13/2017  . Anxiety and depression 04/28/2017  . Encounter for long-term opiate analgesic use 04/28/2017  . Psychophysiological insomnia 04/28/2017  . OA (osteoarthritis) of hip 03/22/2016  . Chronic right hip pain 10/29/2015  . Chronic kidney disease (CKD) stage G3b/A1, moderately decreased glomerular filtration rate (GFR) between 30-44 mL/min/1.73 square meter and albuminuria creatinine ratio less than 30 mg/g 06/03/2015  . Type 2 diabetes mellitus ([place]) 11/09/2014  . Diastolic dysfunction, left ventricle 05/16/2013  . Calcific aortic stenosis 05/16/2013  . Gout 05/12/2013  . Diabetic retinopathy ([place]) 12/17/2012  . Hypertension 06/10/2012  . Hyperlipidemia 06/10/2012  . Hypothyroidism 06/10/2012   Guadelupe Sabin, OTR/L  [phone] 03/27/2019, 4:13 PM  [place] [address] [place], [name], [address] Phone: [phone]   Fax:  [phone]  Name: Sabrina Deleon MRN: [id number] Date of Birth: 29-Aug-1950

## 2019-04-02 ENCOUNTER — Ambulatory Visit (HOSPITAL_COMMUNITY): Payer: Worker's Compensation | Admitting: Occupational Therapy

## 2019-04-02 ENCOUNTER — Encounter (HOSPITAL_COMMUNITY): Payer: Self-pay | Admitting: Occupational Therapy

## 2019-04-02 ENCOUNTER — Other Ambulatory Visit: Payer: Self-pay

## 2019-04-02 DIAGNOSIS — M25511 Pain in right shoulder: Secondary | ICD-10-CM

## 2019-04-02 DIAGNOSIS — M25611 Stiffness of right shoulder, not elsewhere classified: Secondary | ICD-10-CM

## 2019-04-02 DIAGNOSIS — R29898 Other symptoms and signs involving the musculoskeletal system: Secondary | ICD-10-CM | POA: Diagnosis not present

## 2019-04-02 NOTE — Therapy (Signed)
[place], [name], [address] Phone: [phone]   Fax:  [phone]  Occupational Therapy Treatment  Patient Details  Name: Sabrina Deleon MRN: [id number] Date of Birth: 1950/08/01 Referring Provider (OT): Dr. Jeannie Fend   Encounter Date: 04/02/2019  OT End of Session - 04/02/19 1347    Visit Number  28    Number of Visits  32    Date for OT Re-Evaluation  04/17/19    Authorization Type  workers compensation 8 additional visits authorized on 03/25/2019    Authorization - Visit Number  3    Authorization - Number of Visits  8    OT Start Time  0762    OT Stop Time  1342    OT Time Calculation (min)  43 min    Activity Tolerance  Patient tolerated treatment well    Behavior During Therapy  [facility] for tasks assessed/performed       Past Medical History:  Diagnosis Date  . Anxiety   . Aortic stenosis   . Arthritis   . Chronic kidney disease    Dr. Tami Ribas- noted increase kidney function levels- took pt off Metformin and started on Onglyza for Blood sugar control-pt being followed for this.  Marland Kitchen GERD (gastroesophageal reflux disease)   . Gout   . Heart murmur   . Hypertension   . Hypothyroidism   . Morbid obesity ([place]) 03/29/2018   Patient has elevated BMI with diabetes hyperlipidemia and hypertension  . Pancreatitis   . Type 2 diabetes mellitus ([place])   . Wears glasses   . Wears partial dentures    Upper    Past Surgical History:  Procedure Laterality Date  . BACK SURGERY     Lumbar  . CARPAL TUNNEL RELEASE  11/02/2011   Procedure: CARPAL TUNNEL RELEASE;  Surgeon: Tennis Must, MD;  Location: [place];  Service: Orthopedics;  Laterality: Right;  . CARPAL TUNNEL RELEASE Left 01/14/2015   Procedure: LEFT CARPAL TUNNEL RELEASE;  Surgeon: Leanora Cover, MD;  Location: [place];  Service: Orthopedics;  Laterality: Left;  . CHOLECYSTECTOMY     laparoscopic  . COLONOSCOPY    .  COLONOSCOPY N/A 11/12/2015   Procedure: COLONOSCOPY;  Surgeon: Rogene Houston, MD;  [facility];  Service: Endoscopy;  Laterality: N/A;  9:30  . ESOPHAGOGASTRODUODENOSCOPY N/A 11/12/2015   Procedure: ESOPHAGOGASTRODUODENOSCOPY (EGD);  Surgeon: Rogene Houston, MD;  [facility];  Service: Endoscopy;  Laterality: N/A;  . REVERSE SHOULDER ARTHROPLASTY Right 11/04/2018  . REVERSE SHOULDER ARTHROPLASTY Right 11/04/2018   Procedure: REVERSE SHOULDER ARTHROPLASTY;  Surgeon: Verner Mould, MD;  Location: [place];  Service: Orthopedics;  Laterality: Right;  . RIGHT/LEFT HEART CATH AND CORONARY ANGIOGRAPHY N/A 10/18/2017   Procedure: RIGHT/LEFT HEART CATH AND CORONARY ANGIOGRAPHY;  Surgeon: Burnell Blanks, MD;  Location: [facility];  Service: Cardiovascular;  Laterality: N/A;  . TOTAL HIP ARTHROPLASTY Left 03/22/2016   Procedure: LEFT TOTAL HIP ARTHROPLASTY ANTERIOR APPROACH;  Surgeon: Gaynelle Arabian, MD;  Location: [facility];  Service: Orthopedics;  Laterality: Left;    There were no vitals filed for this visit.  Subjective Assessment - 04/02/19 1258    Subjective   S: I can't keep my arm up to do my eye makeup.    Currently in Pain?  No/denies         [facility] OT Assessment - 04/02/19 1257  Assessment   Medical Diagnosis  S/P Right Reverse Total Shoulder Replacement      Precautions   Precautions  Shoulder    Type of Shoulder Precautions  Progress as tolerated               OT Treatments/Exercises (OP) - 04/02/19 1301      Exercises   Exercises  Shoulder      Shoulder Exercises: Supine   Protraction  PROM;5 reps    Horizontal ABduction  PROM;5 reps    External Rotation  PROM;5 reps    Internal Rotation  PROM;5 reps    Flexion  PROM;5 reps    ABduction  PROM;5 reps      Shoulder Exercises: Sidelying   External Rotation  Strengthening;12 reps    External Rotation Weight (lbs)  1    Internal Rotation  Strengthening;12 reps     Internal Rotation Weight (lbs)  1    Flexion  Strengthening;12 reps    Flexion Weight (lbs)  1    ABduction  Strengthening;12 reps    ABduction Weight (lbs)  1    Other Sidelying Exercises  protraction, 12X, 1#    Other Sidelying Exercises  horizontal abduction, 12X, 1#      Shoulder Exercises: Standing   Protraction  Theraband;10 reps    Theraband Level (Shoulder Protraction)  Level 2 (Red)    Horizontal ABduction  Theraband;10 reps    Theraband Level (Shoulder Horizontal ABduction)  Level 2 (Red)    External Rotation  Theraband;10 reps    Theraband Level (Shoulder External Rotation)  Level 2 (Red)    Internal Rotation  Theraband;10 reps    Theraband Level (Shoulder Internal Rotation)  Level 2 (Red)    Flexion  Theraband;10 reps    Theraband Level (Shoulder Flexion)  Level 2 (Red)    ABduction  Theraband;10 reps    Theraband Level (Shoulder ABduction)  Level 2 (Red)      Shoulder Exercises: Therapy Ball   Other Therapy Ball Exercises  green therapy ball with 1# wrist weights: chest press, flexion, circles each direction, 15X each      Shoulder Exercises: ROM/Strengthening   UBE (Upper Arm Bike)  Level 2 2' forward 2' reverse   pace: 6.0   Over Head Lace  2', 1# wrist weight, sitting with arms straight     Other ROM/Strengthening Exercises  90/90 hold, walking around gym, no weight, 1'     Other ROM/Strengthening Exercises  Holding 1# weight at shoulder height, pt writing alphabet working on proximal shoulder strengthening      Functional Reaching Activities   Mid Level  Wearing 1# wrist weight, pt placed 10 cones on middle shelf of overhead cabinet in flexion, removed in abduction             OT Education - 04/02/19 1309    Education Details  sidelying exercises    Person(s) Educated  Patient    Methods  Explanation;Handout;Demonstration    Comprehension  Verbalized understanding;Returned demonstration       OT Short Term Goals - 02/21/19 0945      OT SHORT TERM  GOAL #1   Title  Patient will be educated and independent with HEP for right arm mobility and strength.    Time  4    Period  Weeks    Target Date  01/22/19      OT SHORT TERM GOAL #2   Title  Patient will improve right shoulder P/ROM to [facility]  in order to don shirts and pants with increased independence.    Time  4    Period  Weeks      OT SHORT TERM GOAL #3   Title  Patient will increase right shoulder strength to 3+/5 for increased ability to lift laundry baskets, her dogs, and items at work.    Time  4    Period  Weeks      OT SHORT TERM GOAL #4   Title  Patient will decrease pain in right shoulder to 5/10 or less during completing ADL tasks.    Time  4    Period  Weeks      OT SHORT TERM GOAL #5   Title  Patient will decrease fascial and scar restrictions to moderate in right upper arm for increased mobility and less pain.    Time  4    Period  Weeks        OT Long Term Goals - 03/18/19 1331      OT LONG TERM GOAL #1   Title  Patient will return to prior level of independence and use of right arm as dominant with all B/IADLs, work, and leisure tasks.    Time  8    Period  Weeks    Status  On-going      OT LONG TERM GOAL #2   Title  Patient will increase A/ROM to [facility] in right shoulder for improved ability to comb her hair, tuck shirts in, and reach into overhead cabinets.    Time  8    Period  Weeks    Status  On-going      OT LONG TERM GOAL #3   Title  Patient will increase right shoulder strength to 4/5 or better for improved ability to lift boxes of drinks at work.    Time  8    Period  Weeks    Status  Partially Met      OT LONG TERM GOAL #4   Title  Patient will decrease right shoulder pain to 2/10 or better for improved ability to complete daily tasks.    Time  8    Period  Weeks    Status  On-going      OT LONG TERM GOAL #5   Title  Patient will decrease fascial restrictions in right shoulder region to minimal for improved ability to complete daily  tasks with less pain.    Time  8    Period  Weeks    Status  Achieved            Plan - 04/02/19 1349    Clinical Impression Statement  A: Pt reporting difficulty with makeup application and keeping arm up in that position. Session focusing on strengthening within available ROM, added 90/90 carry task with only weight of arm today, mod to max fatigue at 1". Also added red theraband strengthening in standing, pt did well with tasks, verbal cuing for form and technique.    Body Structure / Function / Physical Skills  ADL;Muscle spasms;UE functional use;Fascial restriction;Pain;Strength;ROM    Plan  P: continue with strengthening required for functional tasks-work on strengthening task simulating makeup application       Patient will benefit from skilled therapeutic intervention in order to improve the following deficits and impairments:   Body Structure / Function / Physical Skills: ADL, Muscle spasms, UE functional use, Fascial restriction, Pain, Strength, ROM       Visit Diagnosis: Other symptoms and  signs involving the musculoskeletal system  Stiffness of right shoulder, not elsewhere classified  Acute pain of right shoulder    Problem List Patient Active Problem List   Diagnosis Date Noted  . Closed fracture of right proximal humerus 11/04/2018  . Primary osteoarthritis involving multiple joints 09/12/2018  . Morbid obesity ([place]) 03/29/2018  . Severe aortic stenosis   . Iron deficiency anemia 10/13/2017  . Anxiety and depression 04/28/2017  . Encounter for long-term opiate analgesic use 04/28/2017  . Psychophysiological insomnia 04/28/2017  . OA (osteoarthritis) of hip 03/22/2016  . Chronic right hip pain 10/29/2015  . Chronic kidney disease (CKD) stage G3b/A1, moderately decreased glomerular filtration rate (GFR) between 30-44 mL/min/1.73 square meter and albuminuria creatinine ratio less than 30 mg/g 06/03/2015  . Type 2 diabetes mellitus ([place]) 11/09/2014  .  Diastolic dysfunction, left ventricle 05/16/2013  . Calcific aortic stenosis 05/16/2013  . Gout 05/12/2013  . Diabetic retinopathy ([place]) 12/17/2012  . Hypertension 06/10/2012  . Hyperlipidemia 06/10/2012  . Hypothyroidism 06/10/2012   Guadelupe Sabin, OTR/L  [phone] 04/02/2019, 1:51 PM  [place] [address] [place], [name], [address] Phone: [phone]   Fax:  [phone]  Name: KATHYANN SPAUGH MRN: [id number] Date of Birth: 07/29/1950

## 2019-04-02 NOTE — Patient Instructions (Signed)
Side Lying Exercises: Complete 10-15X each, 1-2x/day, using 1# weight (soup can or water bottle)   1) Sidelying Flexion:   Lie on your side with your affected arm up. Start with the weight in your top hand by your side. Lift the arm forward and pull your shoulder blade down as the arm lifts up (like a seesaw).      2) Sidelying abduction:   Lie on your side with your arm down straight at your side.  Raise the arm up and overhead, keeping the elbow straight.  You may turn the palm forward and inward if you can.     3) Sidelying horizontal abduction:   Lie on your side with arm straight up towards ceiling. Lower the arm straight out to face the wall and bring back up towards ceiling.     4) Sidelying internal/external rotation:   Lie on side with elbow bent. Lower and raise forearm in direction of the ceiling, keeping elbow bent and by the side.

## 2019-04-04 ENCOUNTER — Telehealth (HOSPITAL_COMMUNITY): Payer: Self-pay | Admitting: Occupational Therapy

## 2019-04-04 ENCOUNTER — Ambulatory Visit (HOSPITAL_COMMUNITY): Payer: Worker's Compensation | Admitting: Occupational Therapy

## 2019-04-04 NOTE — Telephone Encounter (Signed)
pt called to cancel today's appt due to she is sick with a cold.

## 2019-04-05 ENCOUNTER — Other Ambulatory Visit: Payer: Self-pay | Admitting: Nurse Practitioner

## 2019-04-07 ENCOUNTER — Ambulatory Visit: Payer: Medicare Other | Attending: Internal Medicine

## 2019-04-07 ENCOUNTER — Other Ambulatory Visit: Payer: Self-pay

## 2019-04-07 ENCOUNTER — Ambulatory Visit (HOSPITAL_COMMUNITY): Payer: Worker's Compensation | Admitting: Occupational Therapy

## 2019-04-07 ENCOUNTER — Telehealth (HOSPITAL_COMMUNITY): Payer: Self-pay | Admitting: Occupational Therapy

## 2019-04-07 DIAGNOSIS — Z20822 Contact with and (suspected) exposure to covid-19: Secondary | ICD-10-CM | POA: Diagnosis not present

## 2019-04-07 NOTE — Telephone Encounter (Signed)
Wellness 12/27/18

## 2019-04-07 NOTE — Telephone Encounter (Signed)
pt  called to cancel both of her appts due to she is still under quarantine period.

## 2019-04-08 LAB — NOVEL CORONAVIRUS, NAA: SARS-CoV-2, NAA: DETECTED — AB

## 2019-04-09 ENCOUNTER — Telehealth: Payer: Self-pay | Admitting: Nurse Practitioner

## 2019-04-09 ENCOUNTER — Telehealth (HOSPITAL_COMMUNITY): Payer: Self-pay | Admitting: Occupational Therapy

## 2019-04-09 ENCOUNTER — Encounter (HOSPITAL_COMMUNITY): Payer: Medicare Other | Admitting: Occupational Therapy

## 2019-04-09 NOTE — Telephone Encounter (Signed)
[employer] called to notify pt has been tested positive for covid. Pt has tested positive for covid and can not return until Feb 1, or if she gets another covid test done that is negative.

## 2019-04-09 NOTE — Telephone Encounter (Signed)
Called to Discuss with patient about Covid symptoms and the use of bamlanivimab, a monoclonal antibody infusion for those with mild to moderate Covid symptoms and at a high risk of hospitalization.     Pt is qualified for this infusion at the [facility] due to co-morbid conditions and/or a member of an at-risk group.     Unable to reach pt  

## 2019-04-14 ENCOUNTER — Telehealth: Payer: Self-pay | Admitting: Family Medicine

## 2019-04-14 ENCOUNTER — Ambulatory Visit (HOSPITAL_COMMUNITY): Payer: Worker's Compensation

## 2019-04-14 ENCOUNTER — Telehealth (HOSPITAL_COMMUNITY): Payer: Self-pay

## 2019-04-14 NOTE — Telephone Encounter (Signed)
Fax from [facility] on [address] requesting refill on Glipizide 5 mg tablet. Take 1/2 tablet po BID. Pt last seen 12/27/2018 for wellness

## 2019-04-14 NOTE — Telephone Encounter (Signed)
Pt called to cx 2/1 and 2/3 apptments she will be out of quarantine 1/28 and be tested again before she returns. She undertands to bring in the covid test results with her.

## 2019-04-14 NOTE — Telephone Encounter (Signed)
May have 90 days worth of refills needs follow-up visit in April

## 2019-04-15 MED ORDER — GLIPIZIDE 5 MG PO TABS: 2.5000 mg | ORAL_TABLET | Freq: Two times a day (BID) | ORAL | 0 refills | Status: DC

## 2019-04-15 NOTE — Addendum Note (Signed)
Addended by: [name] on: 04/15/2019 08:38 AM   Modules accepted: Orders

## 2019-04-16 ENCOUNTER — Encounter (HOSPITAL_COMMUNITY): Payer: Medicare Other | Admitting: Occupational Therapy

## 2019-04-21 ENCOUNTER — Telehealth: Payer: Self-pay | Admitting: Family Medicine

## 2019-04-21 ENCOUNTER — Encounter (HOSPITAL_COMMUNITY): Payer: Medicare Other

## 2019-04-21 ENCOUNTER — Other Ambulatory Visit: Payer: Self-pay | Admitting: Family Medicine

## 2019-04-21 MED ORDER — HYDROCODONE-ACETAMINOPHEN 5-325 MG PO TABS: ORAL_TABLET | ORAL | 0 refills | Status: DC

## 2019-04-21 NOTE — Telephone Encounter (Signed)
Patient needs a refill on her Hydrocodone.  She has an appt with Hoyle Sauer on Friday but will run out of medication before her appt. Last seen 12-27-18.   [facility]

## 2019-04-21 NOTE — Telephone Encounter (Signed)
Prescription prescription was taken care of please keep follow-up visit

## 2019-04-21 NOTE — Telephone Encounter (Signed)
Pt.notified

## 2019-04-21 NOTE — Telephone Encounter (Signed)
Wellness 12/27/18.

## 2019-04-25 ENCOUNTER — Other Ambulatory Visit: Payer: Self-pay

## 2019-04-25 ENCOUNTER — Encounter (HOSPITAL_COMMUNITY): Payer: Medicare Other | Admitting: Occupational Therapy

## 2019-04-25 ENCOUNTER — Ambulatory Visit (INDEPENDENT_AMBULATORY_CARE_PROVIDER_SITE_OTHER): Payer: Medicare Other | Admitting: Nurse Practitioner

## 2019-04-25 DIAGNOSIS — E039 Hypothyroidism, unspecified: Secondary | ICD-10-CM | POA: Diagnosis not present

## 2019-04-25 DIAGNOSIS — I10 Essential (primary) hypertension: Secondary | ICD-10-CM

## 2019-04-25 DIAGNOSIS — E1121 Type 2 diabetes mellitus with diabetic nephropathy: Secondary | ICD-10-CM

## 2019-04-25 DIAGNOSIS — E785 Hyperlipidemia, unspecified: Secondary | ICD-10-CM

## 2019-04-25 DIAGNOSIS — N1832 Chronic kidney disease, stage 3b: Secondary | ICD-10-CM

## 2019-04-25 DIAGNOSIS — D509 Iron deficiency anemia, unspecified: Secondary | ICD-10-CM

## 2019-04-25 NOTE — Progress Notes (Signed)
  Phone visit Subjective:    Patient ID: Sabrina Deleon, female    DOB: 09-25-1950, 69 y.o.   MRN: [id number]  HPI med check up. Pt states she needs a refill on ketoconazole.   Pt states she is having a cough. Did have covid. Was able to go back to work on Monday.   Virtual Visit via Telephone Note  I connected with Sabrina Deleon on 04/25/19 at 10:20 AM EST by telephone and verified that I am speaking with the correct person using two identifiers.  Location: Patient: home Provider: office   I discussed the limitations, risks, security and privacy concerns of performing an evaluation and management service by telephone and the availability of in person appointments. I also discussed with the patient that there may be a patient responsible charge related to this service. The patient expressed understanding and agreed to proceed.   History of Present Illness: Presents for routine follow-up.  Requesting a refill of her ketoconazole cream, uses only as needed.  Her current prescription was prescribed over a year ago.  Recently had Covid but is doing well.  Continues to have a slight cough but has returned to work.  Plans are recheck with her cardiologist within the next 3 to 4 months.  Reports her weight today is 243 pounds and BP 136/54 at home.  Denies chest pain/ischemic type pain or shortness of breath.  Minimal edema in the lower legs which is controlled if she wears her diabetic socks.  Continues follow-up with the orthopedic specialist regarding her shoulder surgery, doing much better.  Has cut back on her pain medication use, uses half a pill twice a day most days.  Observations/Objective: Today's visit was via telephone Physical exam was not possible for this visit Alert, oriented.  Cheerful affect.  Thoughts logical coherent and relevant.  Assessment and Plan: Problem List Items Addressed This Visit      Cardiovascular and Mediastinum   Hypertension - Primary     Meds ordered  this encounter  Medications  . ketoconazole (NIZORAL) 2 % cream    Sig: Apply BID to affected area prn    Dispense:  30 g    Refill:  0    Order Specific Question:   Supervising Provider    Answer:   Sallee Lange A [[id number]]     Follow Up Instructions: Recommend routine lab work in April, orders will be sent in for this. Recommend follow-up with her cardiologist within the next 3 to 4 months for routine checkup. Return in about 3 months (around 07/23/2019). Plan diabetic checkup including foot exam at that time.  Patient to call back in the meantime if any issues.   I discussed the assessment and treatment plan with the patient. The patient was provided an opportunity to ask questions and all were answered. The patient agreed with the plan and demonstrated an understanding of the instructions.   The patient was advised to call back or seek an in-person evaluation if the symptoms worsen or if the condition fails to improve as anticipated.  I provided 15 minutes of non-face-to-face time during this encounter.      Review of Systems     Objective:   Physical Exam        Assessment & Plan:

## 2019-04-26 ENCOUNTER — Encounter: Payer: Self-pay | Admitting: Nurse Practitioner

## 2019-04-26 MED ORDER — KETOCONAZOLE 2 % EX CREA: TOPICAL_CREAM | CUTANEOUS | 0 refills | Status: DC

## 2019-04-28 ENCOUNTER — Ambulatory Visit (HOSPITAL_COMMUNITY): Payer: Worker's Compensation | Attending: Orthopaedic Surgery

## 2019-04-28 ENCOUNTER — Encounter (HOSPITAL_COMMUNITY): Payer: Self-pay

## 2019-04-28 ENCOUNTER — Other Ambulatory Visit: Payer: Self-pay

## 2019-04-28 DIAGNOSIS — M25611 Stiffness of right shoulder, not elsewhere classified: Secondary | ICD-10-CM

## 2019-04-28 DIAGNOSIS — M25511 Pain in right shoulder: Secondary | ICD-10-CM | POA: Diagnosis not present

## 2019-04-28 DIAGNOSIS — R29898 Other symptoms and signs involving the musculoskeletal system: Secondary | ICD-10-CM | POA: Diagnosis not present

## 2019-04-28 NOTE — Therapy (Signed)
[place], [name], [address] Phone: [phone]   Fax:  [phone]  Occupational Therapy Treatment  Patient Details  Name: Sabrina Deleon MRN: [id number] Date of Birth: 08-12-1950 Referring Provider (OT): Dr. Jeannie Fend   Encounter Date: 04/28/2019  OT End of Session - 04/28/19 1230    Visit Number  29    Number of Visits  32    Date for OT Re-Evaluation  05/26/19    Authorization Type  workers compensation 8 additional visits authorized on 03/25/2019    Authorization - Visit Number  4    Authorization - Number of Visits  8    OT Start Time  1118   ressessment   OT Stop Time  1200    OT Time Calculation (min)  42 min    Activity Tolerance  Patient tolerated treatment well    Behavior During Therapy  [facility] for tasks assessed/performed       Past Medical History:  Diagnosis Date  . Anxiety   . Aortic stenosis   . Arthritis   . Chronic kidney disease    Dr. Tami Ribas- noted increase kidney function levels- took pt off Metformin and started on Onglyza for Blood sugar control-pt being followed for this.  Marland Kitchen GERD (gastroesophageal reflux disease)   . Gout   . Heart murmur   . Hypertension   . Hypothyroidism   . Morbid obesity ([place]) 03/29/2018   Patient has elevated BMI with diabetes hyperlipidemia and hypertension  . Pancreatitis   . Type 2 diabetes mellitus ([place])   . Wears glasses   . Wears partial dentures    Upper    Past Surgical History:  Procedure Laterality Date  . BACK SURGERY     Lumbar  . CARPAL TUNNEL RELEASE  11/02/2011   Procedure: CARPAL TUNNEL RELEASE;  Surgeon: Tennis Must, MD;  Location: [place];  Service: Orthopedics;  Laterality: Right;  . CARPAL TUNNEL RELEASE Left 01/14/2015   Procedure: LEFT CARPAL TUNNEL RELEASE;  Surgeon: Leanora Cover, MD;  Location: [place];  Service: Orthopedics;  Laterality: Left;  . CHOLECYSTECTOMY     laparoscopic  .  COLONOSCOPY    . COLONOSCOPY N/A 11/12/2015   Procedure: COLONOSCOPY;  Surgeon: Rogene Houston, MD;  [facility];  Service: Endoscopy;  Laterality: N/A;  9:30  . ESOPHAGOGASTRODUODENOSCOPY N/A 11/12/2015   Procedure: ESOPHAGOGASTRODUODENOSCOPY (EGD);  Surgeon: Rogene Houston, MD;  [facility];  Service: Endoscopy;  Laterality: N/A;  . REVERSE SHOULDER ARTHROPLASTY Right 11/04/2018  . REVERSE SHOULDER ARTHROPLASTY Right 11/04/2018   Procedure: REVERSE SHOULDER ARTHROPLASTY;  Surgeon: Verner Mould, MD;  Location: [place];  Service: Orthopedics;  Laterality: Right;  . RIGHT/LEFT HEART CATH AND CORONARY ANGIOGRAPHY N/A 10/18/2017   Procedure: RIGHT/LEFT HEART CATH AND CORONARY ANGIOGRAPHY;  Surgeon: Burnell Blanks, MD;  Location: [facility];  Service: Cardiovascular;  Laterality: N/A;  . TOTAL HIP ARTHROPLASTY Left 03/22/2016   Procedure: LEFT TOTAL HIP ARTHROPLASTY ANTERIOR APPROACH;  Surgeon: Gaynelle Arabian, MD;  [facility];  Service: Orthopedics;  Laterality: Left;    There were no vitals filed for this visit.  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    Currently in Pain?  No/denies         [facility] OT Assessment - 04/28/19 1123  Assessment   Medical Diagnosis  S/P Right Reverse Total Shoulder Replacement    Referring Provider (OT)  Dr. Jeannie Fend    Onset Date/Surgical Date  10/31/18      Precautions   Precautions  Shoulder    Type of Shoulder Precautions  Progress as tolerated      Prior Function   Level of Independence  Independent      Observation/Other Assessments   Focus on Therapeutic Outcomes (FOTO)   59/100      ROM / Strength   AROM / PROM / Strength  AROM;PROM;Strength      AROM   Overall AROM Comments  assessed in seated, external and internal rotation with arm adducted    AROM Assessment Site  Shoulder    Right/Left Shoulder  Right     Right Shoulder Flexion  100 Degrees   previous: 122   Right Shoulder ABduction  95 Degrees   previous: 120   Right Shoulder Internal Rotation  90 Degrees   previous: same   Right Shoulder External Rotation  40 Degrees   previous: same     PROM   Overall PROM Comments  assessed in supine, external and internal rotation with shoulder adducted    PROM Assessment Site  Shoulder    Right/Left Shoulder  Right    Right Shoulder Flexion  140 Degrees   previous: 136   Right Shoulder ABduction  120 Degrees   previous: 140   Right Shoulder Internal Rotation  90 Degrees   previous: same   Right Shoulder External Rotation  41 Degrees   previous: 38     Strength   Overall Strength Comments  assessed seated, er/IR adducted    Strength Assessment Site  Shoulder    Right/Left Shoulder  Right    Right Shoulder Flexion  4/5   previous: 4-/5   Right Shoulder ABduction  4+/5   previous: same   Right Shoulder Internal Rotation  5/5   previous: 4-/5   Right Shoulder External Rotation  3+/5   previous: same              OT Treatments/Exercises (OP) - 04/28/19 1224      Exercises   Exercises  Shoulder      Shoulder Exercises: Supine   Protraction  PROM;5 reps    Horizontal ABduction  PROM;5 reps    External Rotation  PROM;5 reps    Internal Rotation  PROM;5 reps    Flexion  PROM;5 reps    ABduction  PROM;5 reps      Shoulder Exercises: Standing   Extension  Theraband;10 reps    Theraband Level (Shoulder Extension)  Level 3 (Green)    Row  Theraband;10 reps    Theraband Level (Shoulder Row)  Level 3 (Green)    Retraction  Theraband;10 reps    Theraband Level (Shoulder Retraction)  Level 3 Nyoka Cowden)             OT Education - 04/28/19 1151    Education Details  green theraband scapular strengthening. Reviewed progress and goals. Discussed HEP. Continue with shoulder stretches 1-4. Added scapular strengthening with band.    Person(s) Educated  Patient    Methods   Explanation;Demonstration;Handout;Verbal cues    Comprehension  Returned demonstration;Verbalized understanding       OT Short Term Goals - 02/21/19 0945      OT SHORT TERM GOAL #1   Title  Patient will be educated and independent with HEP for right arm mobility  and strength.    Time  4    Period  Weeks    Target Date  01/22/19      OT SHORT TERM GOAL #2   Title  Patient will improve right shoulder P/ROM to [facility] in order to don shirts and pants with increased independence.    Time  4    Period  Weeks      OT SHORT TERM GOAL #3   Title  Patient will increase right shoulder strength to 3+/5 for increased ability to lift laundry baskets, her dogs, and items at work.    Time  4    Period  Weeks      OT SHORT TERM GOAL #4   Title  Patient will decrease pain in right shoulder to 5/10 or less during completing ADL tasks.    Time  4    Period  Weeks      OT SHORT TERM GOAL #5   Title  Patient will decrease fascial and scar restrictions to moderate in right upper arm for increased mobility and less pain.    Time  4    Period  Weeks        OT Long Term Goals - 04/28/19 1135      OT LONG TERM GOAL #1   Title  Patient will return to prior level of independence and use of right arm as dominant with all B/IADLs, work, and leisure tasks.    Baseline  2/8: Still needs help getting camisoles on. Sometimes needs assistance to pull her right arm out a long sleeve shirt.    Time  8    Period  Weeks    Status  On-going      OT LONG TERM GOAL #2   Title  Patient will increase A/ROM to [facility] in right shoulder for improved ability to comb her hair, tuck shirts in, and reach into overhead cabinets.    Time  8    Period  Weeks    Status  On-going      OT LONG TERM GOAL #3   Title  Patient will increase right shoulder strength to 4/5 or better for improved ability to lift boxes of drinks at work.    Time  8    Period  Weeks    Status  Partially Met      OT LONG TERM GOAL #4   Title   Patient will decrease right shoulder pain to 2/10 or better for improved ability to complete daily tasks.    Baseline  2/8: 4/10 at work. Can play through a song on the piano and will need to rest for the normal amount of time between songs.    Time  8    Period  Weeks    Status  On-going      OT LONG TERM GOAL #5   Title  Patient will decrease fascial restrictions in right shoulder region to minimal for improved ability to complete daily tasks with less pain.    Time  8    Period  Weeks            Plan - 04/28/19 1233    Clinical Impression Statement  A: Reassessment completed this date. patient recently had a follow up with the MD for her shoulder. He informed patient that her ROM was not going to get any better than it was. He recommended conitnuing therapy for as long as she felt she needed. Patient was absent from therapy for  2 weeks due to COVID. Her passive and active ROM measurements have stayed close to or the same as previously. Some ROM lost with A/ROM measurements. Strength has shown a little improvement. Patient has not met any additional goals. Recommended that patient continue with therapy services for 4 more weeks and if no/little improvement is made again, we will discharge with her completing her HEP at home independently. Pt verbalized understanding. HEP was updated and reviewed.    Body Structure / Function / Physical Skills  ADL;Muscle spasms;UE functional use;Fascial restriction;Pain;Strength;ROM    Plan  P: Continue skilled OT services 2X a week for 4 more weeks focusing on strength and functional use of RUE for home and work tasks. Complete stretching standing while attempting to regain ROM lost. Focuson strength within available range. Update HEP for standing strengthening.    Consulted and Agree with Plan of Care  Patient       Patient will benefit from skilled therapeutic intervention in order to improve the following deficits and impairments:   Body Structure /  Function / Physical Skills: ADL, Muscle spasms, UE functional use, Fascial restriction, Pain, Strength, ROM       Visit Diagnosis: Acute pain of right shoulder  Stiffness of right shoulder, not elsewhere classified  Other symptoms and signs involving the musculoskeletal system    Problem List Patient Active Problem List   Diagnosis Date Noted  . Closed fracture of right proximal humerus 11/04/2018  . Primary osteoarthritis involving multiple joints 09/12/2018  . Morbid obesity ([place]) 03/29/2018  . Severe aortic stenosis   . Iron deficiency anemia 10/13/2017  . Anxiety and depression 04/28/2017  . Encounter for long-term opiate analgesic use 04/28/2017  . Psychophysiological insomnia 04/28/2017  . OA (osteoarthritis) of hip 03/22/2016  . Chronic right hip pain 10/29/2015  . Chronic kidney disease (CKD) stage G3b/A1, moderately decreased glomerular filtration rate (GFR) between 30-44 mL/min/1.73 square meter and albuminuria creatinine ratio less than 30 mg/g 06/03/2015  . Type 2 diabetes mellitus ([place]) 11/09/2014  . Diastolic dysfunction, left ventricle 05/16/2013  . Calcific aortic stenosis 05/16/2013  . Gout 05/12/2013  . Diabetic retinopathy ([place]) 12/17/2012  . Hypertension 06/10/2012  . Hyperlipidemia 06/10/2012  . Hypothyroidism 06/10/2012   Ailene Ravel, OTR/L,CBIS  [phone]  04/28/2019, 12:38 PM  [facility] [address] [place], [name], [address] Phone: [phone]   Fax:  [phone]  Name: ALYSIANA ETHRIDGE MRN: [id number] Date of Birth: 1951/01/13

## 2019-04-28 NOTE — Patient Instructions (Signed)

## 2019-04-30 ENCOUNTER — Encounter (HOSPITAL_COMMUNITY): Payer: Self-pay

## 2019-04-30 ENCOUNTER — Ambulatory Visit (HOSPITAL_COMMUNITY): Payer: Worker's Compensation

## 2019-04-30 ENCOUNTER — Other Ambulatory Visit: Payer: Self-pay

## 2019-04-30 DIAGNOSIS — R29898 Other symptoms and signs involving the musculoskeletal system: Secondary | ICD-10-CM | POA: Diagnosis not present

## 2019-04-30 DIAGNOSIS — M25611 Stiffness of right shoulder, not elsewhere classified: Secondary | ICD-10-CM

## 2019-04-30 DIAGNOSIS — M25511 Pain in right shoulder: Secondary | ICD-10-CM | POA: Diagnosis not present

## 2019-04-30 NOTE — Therapy (Signed)
[place], [name], [address] Phone: [phone]   Fax:  [phone]  Occupational Therapy Treatment  Patient Details  Name: Sabrina Deleon MRN: [id number] Date of Birth: 1950-12-06 Referring Provider (OT): Dr. Jeannie Fend   Encounter Date: 04/30/2019  OT End of Session - 04/30/19 1147    Visit Number  30    Number of Visits  32    Date for OT Re-Evaluation  05/26/19    Authorization Type  workers compensation 8 additional visits authorized on 03/25/2019    Authorization - Visit Number  5    Authorization - Number of Visits  8    OT Start Time  1120   pt in bathroom.   OT Stop Time  1156    OT Time Calculation (min)  36 min    Activity Tolerance  Patient tolerated treatment well    Behavior During Therapy  W[facility] for tasks assessed/performed       Past Medical History:  Diagnosis Date  . Anxiety   . Aortic stenosis   . Arthritis   . Chronic kidney disease    Dr. Tami Ribas- noted increase kidney function levels- took pt off Metformin and started on Onglyza for Blood sugar control-pt being followed for this.  Marland Kitchen GERD (gastroesophageal reflux disease)   . Gout   . Heart murmur   . Hypertension   . Hypothyroidism   . Morbid obesity ([place]) 03/29/2018   Patient has elevated BMI with diabetes hyperlipidemia and hypertension  . Pancreatitis   . Type 2 diabetes mellitus ([place])   . Wears glasses   . Wears partial dentures    Upper    Past Surgical History:  Procedure Laterality Date  . BACK SURGERY     Lumbar  . CARPAL TUNNEL RELEASE  11/02/2011   Procedure: CARPAL TUNNEL RELEASE;  Surgeon: Tennis Must, MD;  Location: [place];  Service: Orthopedics;  Laterality: Right;  . CARPAL TUNNEL RELEASE Left 01/14/2015   Procedure: LEFT CARPAL TUNNEL RELEASE;  Surgeon: Leanora Cover, MD;  Location: [facility];  Service: Orthopedics;  Laterality: Left;  . CHOLECYSTECTOMY     laparoscopic  .  COLONOSCOPY    . COLONOSCOPY N/A 11/12/2015   Procedure: COLONOSCOPY;  Surgeon: Rogene Houston, MD;  [facility];  Service: Endoscopy;  Laterality: N/A;  9:30  . ESOPHAGOGASTRODUODENOSCOPY N/A 11/12/2015   Procedure: ESOPHAGOGASTRODUODENOSCOPY (EGD);  Surgeon: Rogene Houston, MD;  [facility];  Service: Endoscopy;  Laterality: N/A;  . REVERSE SHOULDER ARTHROPLASTY Right 11/04/2018  . REVERSE SHOULDER ARTHROPLASTY Right 11/04/2018   Procedure: REVERSE SHOULDER ARTHROPLASTY;  Surgeon: Verner Mould, MD;  Location: [place];  Service: Orthopedics;  Laterality: Right;  . RIGHT/LEFT HEART CATH AND CORONARY ANGIOGRAPHY N/A 10/18/2017   Procedure: RIGHT/LEFT HEART CATH AND CORONARY ANGIOGRAPHY;  Surgeon: Burnell Blanks, MD;  Location: [facility];  Service: Cardiovascular;  Laterality: N/A;  . TOTAL HIP ARTHROPLASTY Left 03/22/2016   Procedure: LEFT TOTAL HIP ARTHROPLASTY ANTERIOR APPROACH;  Surgeon: Gaynelle Arabian, MD;  [facility];  Service: Orthopedics;  Laterality: Left;    There were no vitals filed for this visit.  Subjective Assessment - 04/30/19 1144    Subjective   S: It was sore after last session.    Currently in Pain?  Yes    Pain Score  4     Pain Location  Shoulder    Pain  Orientation  Right    Pain Descriptors / Indicators  Sore    Pain Type  Acute pain    Pain Radiating Towards  N/A    Pain Onset  In the past 7 days    Pain Frequency  Constant    Aggravating Factors   returning to therapy after 2 weeks off    Pain Relieving Factors  heat, rest    Effect of Pain on Daily Activities  min effect    Multiple Pain Sites  No         [facility] OT Assessment - 04/30/19 1144      Assessment   Medical Diagnosis  S/P Right Reverse Total Shoulder Replacement      Precautions   Precautions  Shoulder    Type of Shoulder Precautions  Progress as tolerated               OT Treatments/Exercises (OP) - 04/30/19 1145       Exercises   Exercises  Shoulder      Shoulder Exercises: Supine   Protraction  PROM;5 reps    Horizontal ABduction  PROM;5 reps    External Rotation  PROM;5 reps    Internal Rotation  PROM;5 reps    Flexion  PROM;5 reps    ABduction  PROM;5 reps      Shoulder Exercises: Standing   Extension  Theraband;10 reps    Theraband Level (Shoulder Extension)  Level 2 (Red)    Row  Theraband;10 reps    Theraband Level (Shoulder Row)  Level 2 (Red)    Retraction  Theraband;10 reps    Theraband Level (Shoulder Retraction)  Level 2 (Red)      Shoulder Exercises: Stretch   Corner Stretch  2 reps;20 seconds   doorway   Cross Chest Stretch  2 reps;20 seconds   using wall as assist   Internal Rotation Stretch  2 reps   20" towel horizontal   Wall Stretch - Flexion  2 reps;20 seconds             OT Education - 04/30/19 1146    Education Details  red theraband provided to use at home if unable to tolerate green.    Person(s) Educated  Patient    Methods  Explanation    Comprehension  Verbalized understanding       OT Short Term Goals - 02/21/19 0945      OT SHORT TERM GOAL #1   Title  Patient will be educated and independent with HEP for right arm mobility and strength.    Time  4    Period  Weeks    Target Date  01/22/19      OT SHORT TERM GOAL #2   Title  Patient will improve right shoulder P/ROM to [facility] in order to don shirts and pants with increased independence.    Time  4    Period  Weeks      OT SHORT TERM GOAL #3   Title  Patient will increase right shoulder strength to 3+/5 for increased ability to lift laundry baskets, her dogs, and items at work.    Time  4    Period  Weeks      OT SHORT TERM GOAL #4   Title  Patient will decrease pain in right shoulder to 5/10 or less during completing ADL tasks.    Time  4    Period  Weeks      OT SHORT TERM GOAL #  5   Title  Patient will decrease fascial and scar restrictions to moderate in right upper arm for increased  mobility and less pain.    Time  4    Period  Weeks        OT Long Term Goals - 04/28/19 1135      OT LONG TERM GOAL #1   Title  Patient will return to prior level of independence and use of right arm as dominant with all B/IADLs, work, and leisure tasks.    Baseline  2/8: Still needs help getting camisoles on. Sometimes needs assistance to pull her right arm out a long sleeve shirt.    Time  8    Period  Weeks    Status  On-going      OT LONG TERM GOAL #2   Title  Patient will increase A/ROM to [facility] in right shoulder for improved ability to comb her hair, tuck shirts in, and reach into overhead cabinets.    Time  8    Period  Weeks    Status  On-going      OT LONG TERM GOAL #3   Title  Patient will increase right shoulder strength to 4/5 or better for improved ability to lift boxes of drinks at work.    Time  8    Period  Weeks    Status  Partially Met      OT LONG TERM GOAL #4   Title  Patient will decrease right shoulder pain to 2/10 or better for improved ability to complete daily tasks.    Baseline  2/8: 4/10 at work. Can play through a song on the piano and will need to rest for the normal amount of time between songs.    Time  8    Period  Weeks    Status  On-going      OT LONG TERM GOAL #5   Title  Patient will decrease fascial restrictions in right shoulder region to minimal for improved ability to complete daily tasks with less pain.    Time  8    Period  Weeks            Plan - 04/30/19 1626    Clinical Impression Statement  A: Patient reports increased soreness after last session which limited her ability to complete scapular strengthening using the green band. Red band was used during session with patient demonstrating good carry over of education and VC for form and technique. Provided red band for use at home in place of green band if needed. Tolerance for passive stretching was limited during session due to pain. Did not spend as much time completing  passive stretching and instead transitioned to self stretching.    Body Structure / Function / Physical Skills  ADL;Muscle spasms;UE functional use;Fascial restriction;Pain;Strength;ROM    Plan  P: Continue with focusing on patient completing self stretching primarily. Work on strengthening within available range. Functional reaching task in cabinet and/or overhead lacing.    Consulted and Agree with Plan of Care  Patient       Patient will benefit from skilled therapeutic intervention in order to improve the following deficits and impairments:   Body Structure / Function / Physical Skills: ADL, Muscle spasms, UE functional use, Fascial restriction, Pain, Strength, ROM       Visit Diagnosis: Acute pain of right shoulder  Stiffness of right shoulder, not elsewhere classified  Other symptoms and signs involving the musculoskeletal system  Problem List Patient Active Problem List   Diagnosis Date Noted  . Closed fracture of right proximal humerus 11/04/2018  . Primary osteoarthritis involving multiple joints 09/12/2018  . Morbid obesity ([place]) 03/29/2018  . Severe aortic stenosis   . Iron deficiency anemia 10/13/2017  . Anxiety and depression 04/28/2017  . Encounter for long-term opiate analgesic use 04/28/2017  . Psychophysiological insomnia 04/28/2017  . OA (osteoarthritis) of hip 03/22/2016  . Chronic right hip pain 10/29/2015  . Chronic kidney disease (CKD) stage G3b/A1, moderately decreased glomerular filtration rate (GFR) between 30-44 mL/min/1.73 square meter and albuminuria creatinine ratio less than 30 mg/g 06/03/2015  . Type 2 diabetes mellitus ([place]) 11/09/2014  . Diastolic dysfunction, left ventricle 05/16/2013  . Calcific aortic stenosis 05/16/2013  . Gout 05/12/2013  . Diabetic retinopathy ([place]) 12/17/2012  . Hypertension 06/10/2012  . Hyperlipidemia 06/10/2012  . Hypothyroidism 06/10/2012   Ailene Ravel, OTR/L,CBIS  [phone]  04/30/2019, 4:29  PM  [facility] [address] [place], [name], [address] Phone: [phone]   Fax:  [phone]  Name: Sabrina Deleon MRN: [id number] Date of Birth: 04/30/1950

## 2019-05-07 ENCOUNTER — Encounter (HOSPITAL_COMMUNITY): Payer: Self-pay | Admitting: Occupational Therapy

## 2019-05-07 ENCOUNTER — Ambulatory Visit (HOSPITAL_COMMUNITY): Payer: Worker's Compensation | Admitting: Occupational Therapy

## 2019-05-07 ENCOUNTER — Other Ambulatory Visit: Payer: Self-pay

## 2019-05-07 DIAGNOSIS — M25611 Stiffness of right shoulder, not elsewhere classified: Secondary | ICD-10-CM | POA: Diagnosis not present

## 2019-05-07 DIAGNOSIS — M25511 Pain in right shoulder: Secondary | ICD-10-CM | POA: Diagnosis not present

## 2019-05-07 DIAGNOSIS — R29898 Other symptoms and signs involving the musculoskeletal system: Secondary | ICD-10-CM | POA: Diagnosis not present

## 2019-05-07 NOTE — Therapy (Signed)
[place], [name], [address] Phone: [phone]   Fax:  [phone]  Occupational Therapy Treatment  Patient Details  Name: Sabrina Deleon MRN: [id number] Date of Birth: 1950-11-16 Referring Provider (OT): Dr. Jeannie Fend   Encounter Date: 05/07/2019  OT End of Session - 05/07/19 1157    Visit Number  31    Number of Visits  32    Date for OT Re-Evaluation  05/26/19    Authorization Type  workers compensation 8 additional visits authorized on 03/25/2019    Authorization - Visit Number  6    Authorization - Number of Visits  8    OT Start Time  1120    OT Stop Time  1158    OT Time Calculation (min)  38 min    Activity Tolerance  Patient tolerated treatment well    Behavior During Therapy  [facility] for tasks assessed/performed       Past Medical History:  Diagnosis Date  . Anxiety   . Aortic stenosis   . Arthritis   . Chronic kidney disease    Dr. Tami Ribas- noted increase kidney function levels- took pt off Metformin and started on Onglyza for Blood sugar control-pt being followed for this.  Marland Kitchen GERD (gastroesophageal reflux disease)   . Gout   . Heart murmur   . Hypertension   . Hypothyroidism   . Morbid obesity ([place]) 03/29/2018   Patient has elevated BMI with diabetes hyperlipidemia and hypertension  . Pancreatitis   . Type 2 diabetes mellitus ([place])   . Wears glasses   . Wears partial dentures    Upper    Past Surgical History:  Procedure Laterality Date  . BACK SURGERY     Lumbar  . CARPAL TUNNEL RELEASE  11/02/2011   Procedure: CARPAL TUNNEL RELEASE;  Surgeon: Tennis Must, MD;  Location: [facility];  Service: Orthopedics;  Laterality: Right;  . CARPAL TUNNEL RELEASE Left 01/14/2015   Procedure: LEFT CARPAL TUNNEL RELEASE;  Surgeon: Leanora Cover, MD;  Location: [place];  Service: Orthopedics;  Laterality: Left;  . CHOLECYSTECTOMY     laparoscopic  . COLONOSCOPY    .  COLONOSCOPY N/A 11/12/2015   Procedure: COLONOSCOPY;  Surgeon: Rogene Houston, MD;  [facility];  Service: Endoscopy;  Laterality: N/A;  9:30  . ESOPHAGOGASTRODUODENOSCOPY N/A 11/12/2015   Procedure: ESOPHAGOGASTRODUODENOSCOPY (EGD);  Surgeon: Rogene Houston, MD;  [facility];  Service: Endoscopy;  Laterality: N/A;  . REVERSE SHOULDER ARTHROPLASTY Right 11/04/2018  . REVERSE SHOULDER ARTHROPLASTY Right 11/04/2018   Procedure: REVERSE SHOULDER ARTHROPLASTY;  Surgeon: Verner Mould, MD;  Location: [facility];  Service: Orthopedics;  Laterality: Right;  . RIGHT/LEFT HEART CATH AND CORONARY ANGIOGRAPHY N/A 10/18/2017   Procedure: RIGHT/LEFT HEART CATH AND CORONARY ANGIOGRAPHY;  Surgeon: Burnell Blanks, MD;  Location: [facility];  Service: Cardiovascular;  Laterality: N/A;  . TOTAL HIP ARTHROPLASTY Left 03/22/2016   Procedure: LEFT TOTAL HIP ARTHROPLASTY ANTERIOR APPROACH;  Surgeon: Gaynelle Arabian, MD;  [facility];  Service: Orthopedics;  Laterality: Left;    There were no vitals filed for this visit.  Subjective Assessment - 05/07/19 1122    Subjective   S: Those straps at home make it sore.    Currently in Pain?  Yes    Pain Score  4     Pain Location  Shoulder    Pain Orientation  Right  Pain Descriptors / Indicators  Sore    Pain Type  Acute pain    Pain Radiating Towards  N/A    Pain Onset  In the past 7 days    Pain Frequency  Constant    Aggravating Factors   more stretching and movement after return to therapy    Pain Relieving Factors  heat, rest    Effect of Pain on Daily Activities  min effect    Multiple Pain Sites  No         [facility] OT Assessment - 05/07/19 1122      Assessment   Medical Diagnosis  S/P Right Reverse Total Shoulder Replacement      Precautions   Precautions  Shoulder    Type of Shoulder Precautions  Progress as tolerated               OT Treatments/Exercises (OP) - 05/07/19 1124      Exercises    Exercises  Shoulder      Shoulder Exercises: Standing   Protraction  Theraband;10 reps    Theraband Level (Shoulder Protraction)  Level 2 (Red)    Horizontal ABduction  Theraband;10 reps    Theraband Level (Shoulder Horizontal ABduction)  Level 2 (Red)    Flexion  Theraband;10 reps    Theraband Level (Shoulder Flexion)  Level 2 (Red)    Extension  Theraband;10 reps    Theraband Level (Shoulder Extension)  Level 2 (Red)    Row  Theraband;10 reps    Theraband Level (Shoulder Row)  Level 2 (Red)    Retraction  Theraband;10 reps    Theraband Level (Shoulder Retraction)  Level 2 (Red)      Shoulder Exercises: Therapy Ball   Other Therapy Ball Exercises  green therapy ball: chest press, flexion, circles each direction, 10X each      Shoulder Exercises: ROM/Strengthening   Ball on Wall  1' flexion 1' abduction      Shoulder Exercises: Stretch   Cross Chest Stretch  2 reps;20 seconds   using wall as assist   Internal Rotation Stretch  2 reps   horizontal towel, 20" holds   Wall Stretch - Flexion  2 reps;20 seconds    Wall Stretch - ABduction  2 reps;20 seconds    Other Shoulder Stretches  doorway stretch, 2 reps, 20" holds      Functional Reaching Activities   Mid Level  pt placed and removed 10 cones from middle shelf of cabinet in kitchen. Min difficulty    High Level  Pt placing and removing squigs from doorway, reaching to highest level possible. Pt able to reach to overhead height.                OT Short Term Goals - 02/21/19 0945      OT SHORT TERM GOAL #1   Title  Patient will be educated and independent with HEP for right arm mobility and strength.    Time  4    Period  Weeks    Target Date  01/22/19      OT SHORT TERM GOAL #2   Title  Patient will improve right shoulder P/ROM to [facility] in order to don shirts and pants with increased independence.    Time  4    Period  Weeks      OT SHORT TERM GOAL #3   Title  Patient will increase right shoulder strength  to 3+/5 for increased ability to lift laundry baskets, her dogs,  and items at work.    Time  4    Period  Weeks      OT SHORT TERM GOAL #4   Title  Patient will decrease pain in right shoulder to 5/10 or less during completing ADL tasks.    Time  4    Period  Weeks      OT SHORT TERM GOAL #5   Title  Patient will decrease fascial and scar restrictions to moderate in right upper arm for increased mobility and less pain.    Time  4    Period  Weeks        OT Long Term Goals - 04/28/19 1135      OT LONG TERM GOAL #1   Title  Patient will return to prior level of independence and use of right arm as dominant with all B/IADLs, work, and leisure tasks.    Baseline  2/8: Still needs help getting camisoles on. Sometimes needs assistance to pull her right arm out a long sleeve shirt.    Time  8    Period  Weeks    Status  On-going      OT LONG TERM GOAL #2   Title  Patient will increase A/ROM to [facility] in right shoulder for improved ability to comb her hair, tuck shirts in, and reach into overhead cabinets.    Time  8    Period  Weeks    Status  On-going      OT LONG TERM GOAL #3   Title  Patient will increase right shoulder strength to 4/5 or better for improved ability to lift boxes of drinks at work.    Time  8    Period  Weeks    Status  Partially Met      OT LONG TERM GOAL #4   Title  Patient will decrease right shoulder pain to 2/10 or better for improved ability to complete daily tasks.    Baseline  2/8: 4/10 at work. Can play through a song on the piano and will need to rest for the normal amount of time between songs.    Time  8    Period  Weeks    Status  On-going      OT LONG TERM GOAL #5   Title  Patient will decrease fascial restrictions in right shoulder region to minimal for improved ability to complete daily tasks with less pain.    Time  8    Period  Weeks            Plan - 05/07/19 1157    Clinical Impression Statement  A: Pt reports soreness after band  exercises. Pt completing stretches at beginning of session, reviewed HEP and completed shoulder strengthening exercises with band. Verbal cuing for adjusting position and form. Pt completing ball on wall and overhead reaching tasks today.Pt did well reaching to overhead height with squigz work.    Body Structure / Function / Physical Skills  ADL;Muscle spasms;UE functional use;Fascial restriction;Pain;Strength;ROM    Plan  P: Continue with stretches at beginning of session, continue working on strengthening within available ROM       Patient will benefit from skilled therapeutic intervention in order to improve the following deficits and impairments:   Body Structure / Function / Physical Skills: ADL, Muscle spasms, UE functional use, Fascial restriction, Pain, Strength, ROM       Visit Diagnosis: Acute pain of right shoulder  Stiffness of right shoulder, not elsewhere  classified  Other symptoms and signs involving the musculoskeletal system    Problem List Patient Active Problem List   Diagnosis Date Noted  . Closed fracture of right proximal humerus 11/04/2018  . Primary osteoarthritis involving multiple joints 09/12/2018  . Morbid obesity ([place]) 03/29/2018  . Severe aortic stenosis   . Iron deficiency anemia 10/13/2017  . Anxiety and depression 04/28/2017  . Encounter for long-term opiate analgesic use 04/28/2017  . Psychophysiological insomnia 04/28/2017  . OA (osteoarthritis) of hip 03/22/2016  . Chronic right hip pain 10/29/2015  . Chronic kidney disease (CKD) stage G3b/A1, moderately decreased glomerular filtration rate (GFR) between 30-44 mL/min/1.73 square meter and albuminuria creatinine ratio less than 30 mg/g 06/03/2015  . Type 2 diabetes mellitus ([place]) 11/09/2014  . Diastolic dysfunction, left ventricle 05/16/2013  . Calcific aortic stenosis 05/16/2013  . Gout 05/12/2013  . Diabetic retinopathy ([place]) 12/17/2012  . Hypertension 06/10/2012  . Hyperlipidemia  06/10/2012  . Hypothyroidism 06/10/2012   Guadelupe Sabin, OTR/L  [phone] 05/07/2019, 12:00 PM  [facility] [address] [place], [name], [address] Phone: [phone]   Fax:  [phone]  Name: Sabrina Deleon MRN: [id number] Date of Birth: 1950-04-30

## 2019-05-09 ENCOUNTER — Telehealth (HOSPITAL_COMMUNITY): Payer: Self-pay | Admitting: Occupational Therapy

## 2019-05-09 ENCOUNTER — Encounter (HOSPITAL_COMMUNITY): Payer: Self-pay | Admitting: Occupational Therapy

## 2019-05-09 ENCOUNTER — Ambulatory Visit (HOSPITAL_COMMUNITY): Payer: Worker's Compensation | Admitting: Occupational Therapy

## 2019-05-09 ENCOUNTER — Other Ambulatory Visit: Payer: Self-pay

## 2019-05-09 DIAGNOSIS — M25511 Pain in right shoulder: Secondary | ICD-10-CM | POA: Diagnosis not present

## 2019-05-09 DIAGNOSIS — R29898 Other symptoms and signs involving the musculoskeletal system: Secondary | ICD-10-CM | POA: Diagnosis not present

## 2019-05-09 DIAGNOSIS — E113293 Type 2 diabetes mellitus with mild nonproliferative diabetic retinopathy without macular edema, bilateral: Secondary | ICD-10-CM | POA: Diagnosis not present

## 2019-05-09 DIAGNOSIS — M25611 Stiffness of right shoulder, not elsewhere classified: Secondary | ICD-10-CM | POA: Diagnosis not present

## 2019-05-09 LAB — HM DIABETES EYE EXAM

## 2019-05-09 NOTE — Telephone Encounter (Signed)
S/w Rickard Rhymes she will fax approval for 7 move visit approved 2/24-3/17/2021 scanned in chart. Cyril Mourning said if nurse has a problem she will call back.  NF 2/21/20201

## 2019-05-09 NOTE — Therapy (Signed)
[place], [name], [address] Phone: [phone]   Fax:  [phone]  Occupational Therapy Treatment  Patient Details  Name: Sabrina Deleon MRN: [id number] Date of Birth: [birth year] Referring Provider (OT): Dr. Jeannie Fend   Encounter Date: 05/09/2019  OT End of Session - 05/09/19 1137    Visit Number  32    Number of Visits  32    Date for OT Re-Evaluation  05/26/19    Authorization Type  workers compensation 8 additional visits authorized on 03/25/2019    Authorization - Visit Number  7    Authorization - Number of Visits  8    OT Start Time  0947    OT Stop Time  1027    OT Time Calculation (min)  40 min    Activity Tolerance  Patient tolerated treatment well    Behavior During Therapy  [facility] for tasks assessed/performed       Past Medical History:  Diagnosis Date  . Anxiety   . Aortic stenosis   . Arthritis   . Chronic kidney disease    Dr. Tami Ribas- noted increase kidney function levels- took pt off Metformin and started on Onglyza for Blood sugar control-pt being followed for this.  Marland Kitchen GERD (gastroesophageal reflux disease)   . Gout   . Heart murmur   . Hypertension   . Hypothyroidism   . Morbid obesity ([place]) 03/29/2018   Patient has elevated BMI with diabetes hyperlipidemia and hypertension  . Pancreatitis   . Type 2 diabetes mellitus ([place])   . Wears glasses   . Wears partial dentures    Upper    Past Surgical History:  Procedure Laterality Date  . BACK SURGERY     Lumbar  . CARPAL TUNNEL RELEASE  11/02/2011   Procedure: CARPAL TUNNEL RELEASE;  Surgeon: Tennis Must, MD;  Location: [place];  Service: Orthopedics;  Laterality: Right;  . CARPAL TUNNEL RELEASE Left 01/14/2015   Procedure: LEFT CARPAL TUNNEL RELEASE;  Surgeon: Leanora Cover, MD;  Location: [place];  Service: Orthopedics;  Laterality: Left;  . CHOLECYSTECTOMY     laparoscopic  . COLONOSCOPY    .  COLONOSCOPY N/A 11/12/2015   Procedure: COLONOSCOPY;  Surgeon: Rogene Houston, MD;  [facility];  Service: Endoscopy;  Laterality: N/A;  9:30  . ESOPHAGOGASTRODUODENOSCOPY N/A 11/12/2015   Procedure: ESOPHAGOGASTRODUODENOSCOPY (EGD);  Surgeon: Rogene Houston, MD;  [facility];  Service: Endoscopy;  Laterality: N/A;  . REVERSE SHOULDER ARTHROPLASTY Right 11/04/2018  . REVERSE SHOULDER ARTHROPLASTY Right 11/04/2018   Procedure: REVERSE SHOULDER ARTHROPLASTY;  Surgeon: Verner Mould, MD;  Location: [place];  Service: Orthopedics;  Laterality: Right;  . RIGHT/LEFT HEART CATH AND CORONARY ANGIOGRAPHY N/A 10/18/2017   Procedure: RIGHT/LEFT HEART CATH AND CORONARY ANGIOGRAPHY;  Surgeon: Burnell Blanks, MD;  Location: [place] [facility];  Service: Cardiovascular;  Laterality: N/A;  . TOTAL HIP ARTHROPLASTY Left 03/22/2016   Procedure: LEFT TOTAL HIP ARTHROPLASTY ANTERIOR APPROACH;  Surgeon: Gaynelle Arabian, MD;  [facility];  Service: Orthopedics;  Laterality: Left;    There were no vitals filed for this visit.  Subjective Assessment - 05/09/19 0943    Subjective   S: I didn't work yesterday so it's feeling ok today.    Currently in Pain?  No/denies         [facility] OT Assessment - 05/09/19 0942      Assessment  Medical Diagnosis  S/P Right Reverse Total Shoulder Replacement      Precautions   Precautions  Shoulder    Type of Shoulder Precautions  Progress as tolerated               OT Treatments/Exercises (OP) - 05/09/19 0945      Exercises   Exercises  Shoulder      Shoulder Exercises: Standing   Protraction  Theraband;12 reps    Theraband Level (Shoulder Protraction)  Level 2 (Red)    Horizontal ABduction  Theraband;12 reps    Theraband Level (Shoulder Horizontal ABduction)  Level 2 (Red)    Flexion  Theraband;12 reps    Theraband Level (Shoulder Flexion)  Level 2 (Red)    Extension  Theraband;12 reps    Theraband Level (Shoulder  Extension)  Level 2 (Red)    Row  Theraband;12 reps    Theraband Level (Shoulder Row)  Level 2 (Red)    Retraction  Theraband;12 reps    Theraband Level (Shoulder Retraction)  Level 2 (Red)      Shoulder Exercises: Therapy Ball   Other Therapy Ball Exercises  green therapy ball: chest press, flexion, circles each direction, 15X each    Other Therapy Ball Exercises  Pt walking green ball up wall into flexion and then walking back down. 10X      Shoulder Exercises: ROM/Strengthening   UBE (Upper Arm Bike)  Level 2 3' forward 3' reverse   pace: 5.0-6.0   Ball on Wall  1' flexion 1' abduction      Shoulder Exercises: Stretch   Cross Chest Stretch  2 reps;20 seconds   using wall as assist   Internal Rotation Stretch  2 reps   20" holds, horizontal towel   Wall Stretch - Flexion  2 reps;20 seconds    Wall Stretch - ABduction  2 reps;20 seconds    Other Shoulder Stretches  doorway stretch, 2 reps, 20" holds      Functional Reaching Activities   High Level  Pt wearing 1# wrist weights, placing and removing squigs from doorway, reaching to highest level possible. Pt able to reach to overhead height.                OT Short Term Goals - 02/21/19 0945      OT SHORT TERM GOAL #1   Title  Patient will be educated and independent with HEP for right arm mobility and strength.    Time  4    Period  Weeks    Target Date  01/22/19      OT SHORT TERM GOAL #2   Title  Patient will improve right shoulder P/ROM to [facility] in order to don shirts and pants with increased independence.    Time  4    Period  Weeks      OT SHORT TERM GOAL #3   Title  Patient will increase right shoulder strength to 3+/5 for increased ability to lift laundry baskets, her dogs, and items at work.    Time  4    Period  Weeks      OT SHORT TERM GOAL #4   Title  Patient will decrease pain in right shoulder to 5/10 or less during completing ADL tasks.    Time  4    Period  Weeks      OT SHORT TERM GOAL #5    Title  Patient will decrease fascial and scar restrictions to moderate in right upper arm  for increased mobility and less pain.    Time  4    Period  Weeks        OT Long Term Goals - 04/28/19 1135      OT LONG TERM GOAL #1   Title  Patient will return to prior level of independence and use of right arm as dominant with all B/IADLs, work, and leisure tasks.    Baseline  2/8: Still needs help getting camisoles on. Sometimes needs assistance to pull her right arm out a long sleeve shirt.    Time  8    Period  Weeks    Status  On-going      OT LONG TERM GOAL #2   Title  Patient will increase A/ROM to [facility] in right shoulder for improved ability to comb her hair, tuck shirts in, and reach into overhead cabinets.    Time  8    Period  Weeks    Status  On-going      OT LONG TERM GOAL #3   Title  Patient will increase right shoulder strength to 4/5 or better for improved ability to lift boxes of drinks at work.    Time  8    Period  Weeks    Status  Partially Met      OT LONG TERM GOAL #4   Title  Patient will decrease right shoulder pain to 2/10 or better for improved ability to complete daily tasks.    Baseline  2/8: 4/10 at work. Can play through a song on the piano and will need to rest for the normal amount of time between songs.    Time  8    Period  Weeks    Status  On-going      OT LONG TERM GOAL #5   Title  Patient will decrease fascial restrictions in right shoulder region to minimal for improved ability to complete daily tasks with less pain.    Time  8    Period  Weeks            Plan - 05/09/19 1138    Clinical Impression Statement  A: Session focusing on RUE stretching and strengthening within available ROM. Continued with self-stretching at beginning of session, red theraband strengthening, and therapy ball strengthening. Added wrist weight for overhead reaching task. Verbal cuing for form and technique.    Body Structure / Function / Physical Skills  ADL;Muscle  spasms;UE functional use;Fascial restriction;Pain;Strength;ROM    Plan  P: Follow up on worker's comp additional visits. Continue with shoulder stretches and strengthening within available ROM, add overhead lacing with 1# wrist weight, update HEP for red theraband strengthening.       Patient will benefit from skilled therapeutic intervention in order to improve the following deficits and impairments:   Body Structure / Function / Physical Skills: ADL, Muscle spasms, UE functional use, Fascial restriction, Pain, Strength, ROM       Visit Diagnosis: Acute pain of right shoulder  Stiffness of right shoulder, not elsewhere classified  Other symptoms and signs involving the musculoskeletal system    Problem List Patient Active Problem List   Diagnosis Date Noted  . Closed fracture of right proximal humerus 11/04/2018  . Primary osteoarthritis involving multiple joints 09/12/2018  . Morbid obesity ([place]) 03/29/2018  . Severe aortic stenosis   . Iron deficiency anemia 10/13/2017  . Anxiety and depression 04/28/2017  . Encounter for long-term opiate analgesic use 04/28/2017  . Psychophysiological insomnia 04/28/2017  .  OA (osteoarthritis) of hip 03/22/2016  . Chronic right hip pain 10/29/2015  . Chronic kidney disease (CKD) stage G3b/A1, moderately decreased glomerular filtration rate (GFR) between 30-44 mL/min/1.73 square meter and albuminuria creatinine ratio less than 30 mg/g 06/03/2015  . Type 2 diabetes mellitus ([place]) 11/09/2014  . Diastolic dysfunction, left ventricle 05/16/2013  . Calcific aortic stenosis 05/16/2013  . Gout 05/12/2013  . Diabetic retinopathy ([place]) 12/17/2012  . Hypertension 06/10/2012  . Hyperlipidemia 06/10/2012  . Hypothyroidism 06/10/2012   Guadelupe Sabin, OTR/L  [phone] 05/09/2019, 11:45 AM  [facility] [address] [place], [name], [address] Phone: [phone]   Fax:  [phone]  Name: Sabrina Deleon MRN: [id number] Date of Birth: 1951-02-22

## 2019-05-10 ENCOUNTER — Other Ambulatory Visit: Payer: Self-pay | Admitting: Family Medicine

## 2019-05-14 ENCOUNTER — Other Ambulatory Visit: Payer: Self-pay

## 2019-05-14 ENCOUNTER — Encounter (HOSPITAL_COMMUNITY): Payer: Self-pay | Admitting: Occupational Therapy

## 2019-05-14 ENCOUNTER — Ambulatory Visit (HOSPITAL_COMMUNITY): Payer: Worker's Compensation | Admitting: Occupational Therapy

## 2019-05-14 DIAGNOSIS — R29898 Other symptoms and signs involving the musculoskeletal system: Secondary | ICD-10-CM | POA: Diagnosis not present

## 2019-05-14 DIAGNOSIS — M25611 Stiffness of right shoulder, not elsewhere classified: Secondary | ICD-10-CM | POA: Diagnosis not present

## 2019-05-14 DIAGNOSIS — M25511 Pain in right shoulder: Secondary | ICD-10-CM | POA: Diagnosis not present

## 2019-05-14 NOTE — Patient Instructions (Signed)

## 2019-05-14 NOTE — Therapy (Signed)
[place], [name], [address] Phone: [phone]   Fax:  [phone]  Occupational Therapy Treatment  Patient Details  Name: Sabrina Deleon MRN: [id number] Date of Birth: 05-16-1950 Referring Provider (OT): Dr. Jeannie Fend   Encounter Date: 05/14/2019  OT End of Session - 05/14/19 1111    Visit Number  33    Number of Visits  36    Date for OT Re-Evaluation  05/26/19    Authorization Type  workers compensation 7 additional visits authorized 2/24-3/17    Authorization - Visit Number  1    Authorization - Number of Visits  7    OT Start Time  1034    OT Stop Time  1113    OT Time Calculation (min)  39 min    Activity Tolerance  Patient tolerated treatment well    Behavior During Therapy  [facility] for tasks assessed/performed       Past Medical History:  Diagnosis Date  . Anxiety   . Aortic stenosis   . Arthritis   . Chronic kidney disease    Dr. Tami Ribas- noted increase kidney function levels- took pt off Metformin and started on Onglyza for Blood sugar control-pt being followed for this.  Marland Kitchen GERD (gastroesophageal reflux disease)   . Gout   . Heart murmur   . Hypertension   . Hypothyroidism   . Morbid obesity ([place]) 03/29/2018   Patient has elevated BMI with diabetes hyperlipidemia and hypertension  . Pancreatitis   . Type 2 diabetes mellitus ([place])   . Wears glasses   . Wears partial dentures    Upper    Past Surgical History:  Procedure Laterality Date  . BACK SURGERY     Lumbar  . CARPAL TUNNEL RELEASE  11/02/2011   Procedure: CARPAL TUNNEL RELEASE;  Surgeon: Tennis Must, MD;  Location: [place];  Service: Orthopedics;  Laterality: Right;  . CARPAL TUNNEL RELEASE Left 01/14/2015   Procedure: LEFT CARPAL TUNNEL RELEASE;  Surgeon: Leanora Cover, MD;  Location: [place];  Service: Orthopedics;  Laterality: Left;  . CHOLECYSTECTOMY     laparoscopic  . COLONOSCOPY    .  COLONOSCOPY N/A 11/12/2015   Procedure: COLONOSCOPY;  Surgeon: Rogene Houston, MD;  [facility];  Service: Endoscopy;  Laterality: N/A;  9:30  . ESOPHAGOGASTRODUODENOSCOPY N/A 11/12/2015   Procedure: ESOPHAGOGASTRODUODENOSCOPY (EGD);  Surgeon: Rogene Houston, MD;  [facility];  Service: Endoscopy;  Laterality: N/A;  . REVERSE SHOULDER ARTHROPLASTY Right 11/04/2018  . REVERSE SHOULDER ARTHROPLASTY Right 11/04/2018   Procedure: REVERSE SHOULDER ARTHROPLASTY;  Surgeon: Verner Mould, MD;  Location: [place];  Service: Orthopedics;  Laterality: Right;  . RIGHT/LEFT HEART CATH AND CORONARY ANGIOGRAPHY N/A 10/18/2017   Procedure: RIGHT/LEFT HEART CATH AND CORONARY ANGIOGRAPHY;  Surgeon: Burnell Blanks, MD;  Location: [place] [facility];  Service: Cardiovascular;  Laterality: N/A;  . TOTAL HIP ARTHROPLASTY Left 03/22/2016   Procedure: LEFT TOTAL HIP ARTHROPLASTY ANTERIOR APPROACH;  Surgeon: Gaynelle Arabian, MD;  [facility];  Service: Orthopedics;  Laterality: Left;    There were no vitals filed for this visit.  Subjective Assessment - 05/14/19 1035    Subjective   S: It hurts while I'm doing the exercises.    Currently in Pain?  Yes    Pain Score  3     Pain Location  Shoulder    Pain Orientation  Right  Pain Descriptors / Indicators  Sore    Pain Type  Acute pain    Pain Radiating Towards  N/A    Pain Onset  In the past 7 days    Pain Frequency  Constant    Aggravating Factors   stretching, movement    Pain Relieving Factors  heat, rest    Effect of Pain on Daily Activities  min effect    Multiple Pain Sites  No         [facility] OT Assessment - 05/14/19 1035      Assessment   Medical Diagnosis  S/P Right Reverse Total Shoulder Replacement      Precautions   Precautions  Shoulder    Type of Shoulder Precautions  Progress as tolerated               OT Treatments/Exercises (OP) - 05/14/19 1039      Exercises   Exercises  Shoulder       Shoulder Exercises: Standing   Protraction  Theraband;12 reps    Theraband Level (Shoulder Protraction)  Level 2 (Red)    Horizontal ABduction  Theraband;12 reps    Theraband Level (Shoulder Horizontal ABduction)  Level 2 (Red)    External Rotation  Theraband;10 reps    Theraband Level (Shoulder External Rotation)  Level 2 (Red)    Internal Rotation  Theraband;10 reps    Flexion  Theraband;12 reps    Theraband Level (Shoulder Flexion)  Level 2 (Red)    ABduction  Theraband;10 reps    Theraband Level (Shoulder ABduction)  Level 2 (Red)      Shoulder Exercises: Therapy Ball   Other Therapy Ball Exercises  green therapy ball: chest press, flexion, circles each direction, 15X each      Shoulder Exercises: ROM/Strengthening   Other ROM/Strengthening Exercises  90/90 hold, walking around gym, no weight, 1'     Other ROM/Strengthening Exercises  Holding 1# weight at shoulder height, pt writing alphabet working on proximal shoulder strengthening; ball pass behind back working on IR, 10X using small pink ball      Shoulder Exercises: Stretch   Internal Rotation Stretch  2 reps   horizontal towel, 20" holds   Wall Stretch - Flexion  2 reps;20 seconds    Wall Stretch - ABduction  2 reps;20 seconds    Other Shoulder Stretches  doorway stretch, 2 reps, 20" holds             OT Education - 05/14/19 1054    Education Details  red theraband strengthening    Person(s) Educated  Patient    Methods  Explanation;Handout    Comprehension  Verbalized understanding;Returned demonstration       OT Short Term Goals - 02/21/19 0945      OT SHORT TERM GOAL #1   Title  Patient will be educated and independent with HEP for right arm mobility and strength.    Time  4    Period  Weeks    Target Date  01/22/19      OT SHORT TERM GOAL #2   Title  Patient will improve right shoulder P/ROM to [facility] in order to don shirts and pants with increased independence.    Time  4    Period  Weeks      OT  SHORT TERM GOAL #3   Title  Patient will increase right shoulder strength to 3+/5 for increased ability to lift laundry baskets, her dogs, and items at work.  Time  4    Period  Weeks      OT SHORT TERM GOAL #4   Title  Patient will decrease pain in right shoulder to 5/10 or less during completing ADL tasks.    Time  4    Period  Weeks      OT SHORT TERM GOAL #5   Title  Patient will decrease fascial and scar restrictions to moderate in right upper arm for increased mobility and less pain.    Time  4    Period  Weeks        OT Long Term Goals - 04/28/19 1135      OT LONG TERM GOAL #1   Title  Patient will return to prior level of independence and use of right arm as dominant with all B/IADLs, work, and leisure tasks.    Baseline  2/8: Still needs help getting camisoles on. Sometimes needs assistance to pull her right arm out a long sleeve shirt.    Time  8    Period  Weeks    Status  On-going      OT LONG TERM GOAL #2   Title  Patient will increase A/ROM to [facility] in right shoulder for improved ability to comb her hair, tuck shirts in, and reach into overhead cabinets.    Time  8    Period  Weeks    Status  On-going      OT LONG TERM GOAL #3   Title  Patient will increase right shoulder strength to 4/5 or better for improved ability to lift boxes of drinks at work.    Time  8    Period  Weeks    Status  Partially Met      OT LONG TERM GOAL #4   Title  Patient will decrease right shoulder pain to 2/10 or better for improved ability to complete daily tasks.    Baseline  2/8: 4/10 at work. Can play through a song on the piano and will need to rest for the normal amount of time between songs.    Time  8    Period  Weeks    Status  On-going      OT LONG TERM GOAL #5   Title  Patient will decrease fascial restrictions in right shoulder region to minimal for improved ability to complete daily tasks with less pain.    Time  8    Period  Weeks            Plan -  05/14/19 1107    Clinical Impression Statement  A: Worker's comp approved 7 additonal visits. Session focusing on RUE stretching and strengthening. Added 90/90 carry without weight, proximal shoulder strengthening with alphabet writing, and red theraband strengthening tasks. Pt provided with occasional rest breaks for fatigue, verbal cuing for form and technique. Pt reports consistent dull ache during the day and is unable to sleep in the bed at night. Educated pt on trialing different positions in bed including propping up on pillows or finding a wedge pillow to trial with pillows under knees for stability and to prevent sliding.    Body Structure / Function / Physical Skills  ADL;Muscle spasms;UE functional use;Fascial restriction;Pain;Strength;ROM    Plan  P: Follow up on HEP, continue with shoulder stretches at beginning of session and strengthening. Add 1# weight to therapy ball tasks and complete overhead lacing       Patient will benefit from skilled therapeutic intervention in order  to improve the following deficits and impairments:   Body Structure / Function / Physical Skills: ADL, Muscle spasms, UE functional use, Fascial restriction, Pain, Strength, ROM       Visit Diagnosis: Acute pain of right shoulder  Stiffness of right shoulder, not elsewhere classified  Other symptoms and signs involving the musculoskeletal system    Problem List Patient Active Problem List   Diagnosis Date Noted  . Closed fracture of right proximal humerus 11/04/2018  . Primary osteoarthritis involving multiple joints 09/12/2018  . Morbid obesity ([place]) 03/29/2018  . Severe aortic stenosis   . Iron deficiency anemia 10/13/2017  . Anxiety and depression 04/28/2017  . Encounter for long-term opiate analgesic use 04/28/2017  . Psychophysiological insomnia 04/28/2017  . OA (osteoarthritis) of hip 03/22/2016  . Chronic right hip pain 10/29/2015  . Chronic kidney disease (CKD) stage G3b/A1, moderately  decreased glomerular filtration rate (GFR) between 30-44 mL/min/1.73 square meter and albuminuria creatinine ratio less than 30 mg/g 06/03/2015  . Type 2 diabetes mellitus ([place]) 11/09/2014  . Diastolic dysfunction, left ventricle 05/16/2013  . Calcific aortic stenosis 05/16/2013  . Gout 05/12/2013  . Diabetic retinopathy ([place]) 12/17/2012  . Hypertension 06/10/2012  . Hyperlipidemia 06/10/2012  . Hypothyroidism 06/10/2012   Guadelupe Sabin, OTR/L  [phone] 05/14/2019, 11:13 AM  [place] [employer], [name], [address] Phone: [phone]   Fax:  [phone]  Name: LIMA CHILLEMI MRN: [id number] Date of Birth: 01/28/1951

## 2019-05-16 ENCOUNTER — Ambulatory Visit (HOSPITAL_COMMUNITY): Payer: Worker's Compensation | Admitting: Occupational Therapy

## 2019-05-16 ENCOUNTER — Encounter (HOSPITAL_COMMUNITY): Payer: Self-pay | Admitting: Occupational Therapy

## 2019-05-16 ENCOUNTER — Other Ambulatory Visit: Payer: Self-pay

## 2019-05-16 DIAGNOSIS — M25611 Stiffness of right shoulder, not elsewhere classified: Secondary | ICD-10-CM

## 2019-05-16 DIAGNOSIS — R29898 Other symptoms and signs involving the musculoskeletal system: Secondary | ICD-10-CM | POA: Diagnosis not present

## 2019-05-16 DIAGNOSIS — M25511 Pain in right shoulder: Secondary | ICD-10-CM

## 2019-05-16 NOTE — Therapy (Signed)
[place], [name], [address] Phone: [phone]   Fax:  [phone]  Occupational Therapy Treatment  Patient Details  Name: Sabrina Deleon MRN: [id number] Date of Birth: 10-07-1950 Referring Provider (OT): Dr. Jeannie Fend   Encounter Date: 05/16/2019  OT End of Session - 05/16/19 1031    Visit Number  34    Number of Visits  36    Date for OT Re-Evaluation  05/26/19    Authorization Type  workers compensation 7 additional visits authorized 2/24-3/17    Authorization - Visit Number  2    Authorization - Number of Visits  7    OT Start Time  0948    OT Stop Time  1028    OT Time Calculation (min)  40 min    Activity Tolerance  Patient tolerated treatment well    Behavior During Therapy  [facility] for tasks assessed/performed       Past Medical History:  Diagnosis Date  . Anxiety   . Aortic stenosis   . Arthritis   . Chronic kidney disease    Dr. Tami Ribas- noted increase kidney function levels- took pt off Metformin and started on Onglyza for Blood sugar control-pt being followed for this.  Marland Kitchen GERD (gastroesophageal reflux disease)   . Gout   . Heart murmur   . Hypertension   . Hypothyroidism   . Morbid obesity ([place]) 03/29/2018   Patient has elevated BMI with diabetes hyperlipidemia and hypertension  . Pancreatitis   . Type 2 diabetes mellitus ([place])   . Wears glasses   . Wears partial dentures    Upper    Past Surgical History:  Procedure Laterality Date  . BACK SURGERY     Lumbar  . CARPAL TUNNEL RELEASE  11/02/2011   Procedure: CARPAL TUNNEL RELEASE;  Surgeon: Tennis Must, MD;  Location: [place];  Service: Orthopedics;  Laterality: Right;  . CARPAL TUNNEL RELEASE Left 01/14/2015   Procedure: LEFT CARPAL TUNNEL RELEASE;  Surgeon: Leanora Cover, MD;  Location: [place];  Service: Orthopedics;  Laterality: Left;  . CHOLECYSTECTOMY     laparoscopic  . COLONOSCOPY    .  COLONOSCOPY N/A 11/12/2015   Procedure: COLONOSCOPY;  Surgeon: Rogene Houston, MD;  [facility];  Service: Endoscopy;  Laterality: N/A;  9:30  . ESOPHAGOGASTRODUODENOSCOPY N/A 11/12/2015   Procedure: ESOPHAGOGASTRODUODENOSCOPY (EGD);  Surgeon: Rogene Houston, MD;  [facility];  Service: Endoscopy;  Laterality: N/A;  . REVERSE SHOULDER ARTHROPLASTY Right 11/04/2018  . REVERSE SHOULDER ARTHROPLASTY Right 11/04/2018   Procedure: REVERSE SHOULDER ARTHROPLASTY;  Surgeon: Verner Mould, MD;  Location: [place];  Service: Orthopedics;  Laterality: Right;  . RIGHT/LEFT HEART CATH AND CORONARY ANGIOGRAPHY N/A 10/18/2017   Procedure: RIGHT/LEFT HEART CATH AND CORONARY ANGIOGRAPHY;  Surgeon: Burnell Blanks, MD;  Location: [place] [facility];  Service: Cardiovascular;  Laterality: N/A;  . TOTAL HIP ARTHROPLASTY Left 03/22/2016   Procedure: LEFT TOTAL HIP ARTHROPLASTY ANTERIOR APPROACH;  Surgeon: Gaynelle Arabian, MD;  [facility];  Service: Orthopedics;  Laterality: Left;    There were no vitals filed for this visit.  Subjective Assessment - 05/16/19 0951    Subjective   S: I just have pain when I move it    Currently in Pain?  Yes    Pain Score  2     Pain Location  Shoulder    Pain Orientation  Right  Pain Descriptors / Indicators  Sore    Pain Type  Acute pain    Pain Radiating Towards  N/A    Pain Onset  In the past 7 days    Pain Frequency  Constant    Aggravating Factors   stretching, movements    Pain Relieving Factors  heat, rest    Effect of Pain on Daily Activities  min effect    Multiple Pain Sites  No         [facility] OT Assessment - 05/16/19 1031      Assessment   Medical Diagnosis  S/P Right Reverse Total Shoulder Replacement      Precautions   Precautions  Shoulder    Type of Shoulder Precautions  Progress as tolerated               OT Treatments/Exercises (OP) - 05/16/19 0954      Exercises   Exercises  Shoulder       Shoulder Exercises: Standing   Protraction  Theraband;12 reps    Theraband Level (Shoulder Protraction)  Level 2 (Red)    Horizontal ABduction  Theraband;12 reps    Theraband Level (Shoulder Horizontal ABduction)  Level 2 (Red)    External Rotation  Theraband;10 reps    Theraband Level (Shoulder External Rotation)  Level 2 (Red)    Internal Rotation  Theraband;10 reps    Theraband Level (Shoulder Internal Rotation)  Level 2 (Red)    Flexion  Theraband;12 reps    Theraband Level (Shoulder Flexion)  Level 2 (Red)    ABduction  Theraband;10 reps    Theraband Level (Shoulder ABduction)  Level 2 (Red)    Extension  Theraband;12 reps    Theraband Level (Shoulder Extension)  Level 2 (Red)    Row  Theraband;12 reps    Theraband Level (Shoulder Row)  Level 2 (Red)    Retraction  Theraband;12 reps    Theraband Level (Shoulder Retraction)  Level 2 (Red)      Shoulder Exercises: Therapy Ball   Other Therapy Ball Exercises  green therapy ball w/ 1lb wrist weights: chest press, flexion, circles each direction, 15X each      Shoulder Exercises: ROM/Strengthening   Over Head Lace  2', 1# wrist weight, sitting     Other ROM/Strengthening Exercises  90/90 hold, walking around gym, no weight, 1'     Other ROM/Strengthening Exercises  Holding 1# weight at shoulder height, pt writing alphabet working on proximal shoulder strengthening; ball pass behind back working on IR, 10X using small pink ball      Shoulder Exercises: Stretch   Internal Rotation Stretch  2 reps   horizontal towel, 20" holds   Wall Stretch - Flexion  2 reps;20 seconds    Wall Stretch - ABduction  2 reps;20 seconds      Functional Reaching Activities   Mid Level  pt placed and removed 10 cones from middle shelf of cabinet in treatment bay wearing 1lb wrist weights. Placed in flexion and removed in abduction. Min difficulty               OT Short Term Goals - 02/21/19 0945      OT SHORT TERM GOAL #1   Title  Patient  will be educated and independent with HEP for right arm mobility and strength.    Time  4    Period  Weeks    Target Date  01/22/19      OT SHORT TERM GOAL #2  Title  Patient will improve right shoulder P/ROM to [facility] in order to don shirts and pants with increased independence.    Time  4    Period  Weeks      OT SHORT TERM GOAL #3   Title  Patient will increase right shoulder strength to 3+/5 for increased ability to lift laundry baskets, her dogs, and items at work.    Time  4    Period  Weeks      OT SHORT TERM GOAL #4   Title  Patient will decrease pain in right shoulder to 5/10 or less during completing ADL tasks.    Time  4    Period  Weeks      OT SHORT TERM GOAL #5   Title  Patient will decrease fascial and scar restrictions to moderate in right upper arm for increased mobility and less pain.    Time  4    Period  Weeks        OT Long Term Goals - 04/28/19 1135      OT LONG TERM GOAL #1   Title  Patient will return to prior level of independence and use of right arm as dominant with all B/IADLs, work, and leisure tasks.    Baseline  2/8: Still needs help getting camisoles on. Sometimes needs assistance to pull her right arm out a long sleeve shirt.    Time  8    Period  Weeks    Status  On-going      OT LONG TERM GOAL #2   Title  Patient will increase A/ROM to [facility] in right shoulder for improved ability to comb her hair, tuck shirts in, and reach into overhead cabinets.    Time  8    Period  Weeks    Status  On-going      OT LONG TERM GOAL #3   Title  Patient will increase right shoulder strength to 4/5 or better for improved ability to lift boxes of drinks at work.    Time  8    Period  Weeks    Status  Partially Met      OT LONG TERM GOAL #4   Title  Patient will decrease right shoulder pain to 2/10 or better for improved ability to complete daily tasks.    Baseline  2/8: 4/10 at work. Can play through a song on the piano and will need to rest for the  normal amount of time between songs.    Time  8    Period  Weeks    Status  On-going      OT LONG TERM GOAL #5   Title  Patient will decrease fascial restrictions in right shoulder region to minimal for improved ability to complete daily tasks with less pain.    Time  8    Period  Weeks            Plan - 05/16/19 1033    Clinical Impression Statement  A: Session continuing to focus on R UE strengthening within avalible ROM. Pt had improved ROM and form during theraband exercises and improved tolerance during session. Added overhead lacing with wrist weight, 1 rest break required. Verbal cueing provided for form and technique. Pt reports "nothing" works for pillow positioning for sleeping in the bed.    Body Structure / Function / Physical Skills  ADL;Muscle spasms;UE functional use;Fascial restriction;Pain;Strength;ROM    Plan  P: Continue with strengthening and shoulder stability work.  Attempt body blade.    OT Home Exercise Plan  12/25/18:  table slides    Consulted and Agree with Plan of Care  Patient       Patient will benefit from skilled therapeutic intervention in order to improve the following deficits and impairments:   Body Structure / Function / Physical Skills: ADL, Muscle spasms, UE functional use, Fascial restriction, Pain, Strength, ROM       Visit Diagnosis: Acute pain of right shoulder  Stiffness of right shoulder, not elsewhere classified  Other symptoms and signs involving the musculoskeletal system    Problem List Patient Active Problem List   Diagnosis Date Noted  . Closed fracture of right proximal humerus 11/04/2018  . Primary osteoarthritis involving multiple joints 09/12/2018  . Morbid obesity ([place]) 03/29/2018  . Severe aortic stenosis   . Iron deficiency anemia 10/13/2017  . Anxiety and depression 04/28/2017  . Encounter for long-term opiate analgesic use 04/28/2017  . Psychophysiological insomnia 04/28/2017  . OA (osteoarthritis) of hip  03/22/2016  . Chronic right hip pain 10/29/2015  . Chronic kidney disease (CKD) stage G3b/A1, moderately decreased glomerular filtration rate (GFR) between 30-44 mL/min/1.73 square meter and albuminuria creatinine ratio less than 30 mg/g 06/03/2015  . Type 2 diabetes mellitus ([place]) 11/09/2014  . Diastolic dysfunction, left ventricle 05/16/2013  . Calcific aortic stenosis 05/16/2013  . Gout 05/12/2013  . Diabetic retinopathy ([place]) 12/17/2012  . Hypertension 06/10/2012  . Hyperlipidemia 06/10/2012  . Hypothyroidism 06/10/2012   Guadelupe Sabin, OTR/L  [phone] 05/16/2019, 10:38 AM  [place] [address] [place], [name], [address] Phone: [phone]   Fax:  [phone]  Name: Sabrina Deleon MRN: [id number] Date of Birth: Feb 24, 1951

## 2019-05-21 ENCOUNTER — Encounter (HOSPITAL_COMMUNITY): Payer: Self-pay

## 2019-05-21 ENCOUNTER — Other Ambulatory Visit: Payer: Self-pay

## 2019-05-21 ENCOUNTER — Ambulatory Visit (HOSPITAL_COMMUNITY): Payer: Worker's Compensation | Attending: Orthopaedic Surgery

## 2019-05-21 DIAGNOSIS — M25611 Stiffness of right shoulder, not elsewhere classified: Secondary | ICD-10-CM | POA: Insufficient documentation

## 2019-05-21 DIAGNOSIS — R29898 Other symptoms and signs involving the musculoskeletal system: Secondary | ICD-10-CM | POA: Diagnosis not present

## 2019-05-21 DIAGNOSIS — M25511 Pain in right shoulder: Secondary | ICD-10-CM | POA: Diagnosis not present

## 2019-05-21 NOTE — Therapy (Signed)
[place], [name], [address] Phone: [phone]   Fax:  [phone]  Occupational Therapy Treatment  Patient Details  Name: Sabrina Deleon MRN: [id number] Date of Birth: 1950-10-22 Referring Provider (OT): Dr. Jeannie Fend   Encounter Date: 05/21/2019  OT End of Session - 05/21/19 1153    Visit Number  35    Number of Visits  36    Date for OT Re-Evaluation  05/26/19    Authorization Type  workers compensation 7 additional visits authorized 2/24-3/17    Authorization - Visit Number  3    Authorization - Number of Visits  7    OT Start Time  1120   late check in.   OT Stop Time  1155    OT Time Calculation (min)  35 min    Activity Tolerance  Patient tolerated treatment well    Behavior During Therapy  [facility] for tasks assessed/performed       Past Medical History:  Diagnosis Date  . Anxiety   . Aortic stenosis   . Arthritis   . Chronic kidney disease    Dr. Tami Ribas- noted increase kidney function levels- took pt off Metformin and started on Onglyza for Blood sugar control-pt being followed for this.  Marland Kitchen GERD (gastroesophageal reflux disease)   . Gout   . Heart murmur   . Hypertension   . Hypothyroidism   . Morbid obesity ([place]) 03/29/2018   Patient has elevated BMI with diabetes hyperlipidemia and hypertension  . Pancreatitis   . Type 2 diabetes mellitus ([place])   . Wears glasses   . Wears partial dentures    Upper    Past Surgical History:  Procedure Laterality Date  . BACK SURGERY     Lumbar  . CARPAL TUNNEL RELEASE  11/02/2011   Procedure: CARPAL TUNNEL RELEASE;  Surgeon: Tennis Must, MD;  Location: [place];  Service: Orthopedics;  Laterality: Right;  . CARPAL TUNNEL RELEASE Left 01/14/2015   Procedure: LEFT CARPAL TUNNEL RELEASE;  Surgeon: Leanora Cover, MD;  Location: [place];  Service: Orthopedics;  Laterality: Left;  . CHOLECYSTECTOMY     laparoscopic  .  COLONOSCOPY    . COLONOSCOPY N/A 11/12/2015   Procedure: COLONOSCOPY;  Surgeon: Rogene Houston, MD;  [facility];  Service: Endoscopy;  Laterality: N/A;  9:30  . ESOPHAGOGASTRODUODENOSCOPY N/A 11/12/2015   Procedure: ESOPHAGOGASTRODUODENOSCOPY (EGD);  Surgeon: Rogene Houston, MD;  [facility];  Service: Endoscopy;  Laterality: N/A;  . REVERSE SHOULDER ARTHROPLASTY Right 11/04/2018  . REVERSE SHOULDER ARTHROPLASTY Right 11/04/2018   Procedure: REVERSE SHOULDER ARTHROPLASTY;  Surgeon: Verner Mould, MD;  Location: [place];  Service: Orthopedics;  Laterality: Right;  . RIGHT/LEFT HEART CATH AND CORONARY ANGIOGRAPHY N/A 10/18/2017   Procedure: RIGHT/LEFT HEART CATH AND CORONARY ANGIOGRAPHY;  Surgeon: Burnell Blanks, MD;  Location: [place] [facility];  Service: Cardiovascular;  Laterality: N/A;  . TOTAL HIP ARTHROPLASTY Left 03/22/2016   Procedure: LEFT TOTAL HIP ARTHROPLASTY ANTERIOR APPROACH;  Surgeon: Gaynelle Arabian, MD;  [facility];  Service: Orthopedics;  Laterality: Left;    There were no vitals filed for this visit.  Subjective Assessment - 05/21/19 1127    Subjective   S: It doesn't hurt when I'm still or during the weekends.    Currently in Pain?  Yes    Pain Score  3     Pain Location  Shoulder  Pain Orientation  Right    Pain Descriptors / Indicators  Sore    Pain Type  Acute pain         [facility] OT Assessment - 05/21/19 1128      Assessment   Medical Diagnosis  S/P Right Reverse Total Shoulder Replacement      Precautions   Precautions  Shoulder    Type of Shoulder Precautions  Progress as tolerated               OT Treatments/Exercises (OP) - 05/21/19 1136      Exercises   Exercises  Shoulder      Shoulder Exercises: ROM/Strengthening   UBE (Upper Arm Bike)  Level 3' reverse only   pace: 6.0-7.0   Over Head Lace  2', 1# wrist weight, sitting     Ball on Wall  1' flexion 1' abduction    Other ROM/Strengthening  Exercises  Using pink ball; 10 passess each direction; behind back then behind head      Shoulder Exercises: Body Blade   Flexion  5 reps    ABduction  5 reps    External Rotation  5 reps    Internal Rotation  5 reps             OT Education - 05/21/19 1252    Education Details  Discussed toilleting hygiene issues and adaptive equipment that may help. Juvo toilet aid.    Person(s) Educated  Patient    Methods  Explanation    Comprehension  Verbalized understanding       OT Short Term Goals - 02/21/19 0945      OT SHORT TERM GOAL #1   Title  Patient will be educated and independent with HEP for right arm mobility and strength.    Time  4    Period  Weeks    Target Date  01/22/19      OT SHORT TERM GOAL #2   Title  Patient will improve right shoulder P/ROM to [facility] in order to don shirts and pants with increased independence.    Time  4    Period  Weeks      OT SHORT TERM GOAL #3   Title  Patient will increase right shoulder strength to 3+/5 for increased ability to lift laundry baskets, her dogs, and items at work.    Time  4    Period  Weeks      OT SHORT TERM GOAL #4   Title  Patient will decrease pain in right shoulder to 5/10 or less during completing ADL tasks.    Time  4    Period  Weeks      OT SHORT TERM GOAL #5   Title  Patient will decrease fascial and scar restrictions to moderate in right upper arm for increased mobility and less pain.    Time  4    Period  Weeks        OT Long Term Goals - 04/28/19 1135      OT LONG TERM GOAL #1   Title  Patient will return to prior level of independence and use of right arm as dominant with all B/IADLs, work, and leisure tasks.    Baseline  2/8: Still needs help getting camisoles on. Sometimes needs assistance to pull her right arm out a long sleeve shirt.    Time  8    Period  Weeks    Status  On-going  OT LONG TERM GOAL #2   Title  Patient will increase A/ROM to [facility] in right shoulder for improved  ability to comb her hair, tuck shirts in, and reach into overhead cabinets.    Time  8    Period  Weeks    Status  On-going      OT LONG TERM GOAL #3   Title  Patient will increase right shoulder strength to 4/5 or better for improved ability to lift boxes of drinks at work.    Time  8    Period  Weeks    Status  Partially Met      OT LONG TERM GOAL #4   Title  Patient will decrease right shoulder pain to 2/10 or better for improved ability to complete daily tasks.    Baseline  2/8: 4/10 at work. Can play through a song on the piano and will need to rest for the normal amount of time between songs.    Time  8    Period  Weeks    Status  On-going      OT LONG TERM GOAL #5   Title  Patient will decrease fascial restrictions in right shoulder region to minimal for improved ability to complete daily tasks with less pain.    Time  8    Period  Weeks            Plan - 05/21/19 1156    Clinical Impression Statement  A: Added body blade to work on shoulder stability. Discussed options to assist with toilet hygiene; one recommended is Juvo toilet aid. VC for form and technique were provided during session.    Body Structure / Function / Physical Skills  ADL;Muscle spasms;UE functional use;Fascial restriction;Pain;Strength;ROM    Plan  P: Continue with strengthening and shoulder stability work.    Consulted and Agree with Plan of Care  Patient       Patient will benefit from skilled therapeutic intervention in order to improve the following deficits and impairments:   Body Structure / Function / Physical Skills: ADL, Muscle spasms, UE functional use, Fascial restriction, Pain, Strength, ROM       Visit Diagnosis: Acute pain of right shoulder  Stiffness of right shoulder, not elsewhere classified  Other symptoms and signs involving the musculoskeletal system    Problem List Patient Active Problem List   Diagnosis Date Noted  . Closed fracture of right proximal humerus  11/04/2018  . Primary osteoarthritis involving multiple joints 09/12/2018  . Morbid obesity ([place]) 03/29/2018  . Severe aortic stenosis   . Iron deficiency anemia 10/13/2017  . Anxiety and depression 04/28/2017  . Encounter for long-term opiate analgesic use 04/28/2017  . Psychophysiological insomnia 04/28/2017  . OA (osteoarthritis) of hip 03/22/2016  . Chronic right hip pain 10/29/2015  . Chronic kidney disease (CKD) stage G3b/A1, moderately decreased glomerular filtration rate (GFR) between 30-44 mL/min/1.73 square meter and albuminuria creatinine ratio less than 30 mg/g 06/03/2015  . Type 2 diabetes mellitus ([place]) 11/09/2014  . Diastolic dysfunction, left ventricle 05/16/2013  . Calcific aortic stenosis 05/16/2013  . Gout 05/12/2013  . Diabetic retinopathy ([place]) 12/17/2012  . Hypertension 06/10/2012  . Hyperlipidemia 06/10/2012  . Hypothyroidism 06/10/2012   Ailene Ravel, OTR/L,CBIS  [phone]  05/21/2019, 12:53 PM  [place] [address] [place], [name], [address] Phone: [phone]   Fax:  [phone]  Name: Sabrina Deleon MRN: [id number] Date of Birth: [birth year]

## 2019-05-23 ENCOUNTER — Ambulatory Visit (HOSPITAL_COMMUNITY): Payer: Worker's Compensation | Admitting: Occupational Therapy

## 2019-05-23 ENCOUNTER — Other Ambulatory Visit: Payer: Self-pay

## 2019-05-23 ENCOUNTER — Encounter (HOSPITAL_COMMUNITY): Payer: Self-pay | Admitting: Occupational Therapy

## 2019-05-23 DIAGNOSIS — M25511 Pain in right shoulder: Secondary | ICD-10-CM | POA: Diagnosis not present

## 2019-05-23 DIAGNOSIS — R29898 Other symptoms and signs involving the musculoskeletal system: Secondary | ICD-10-CM

## 2019-05-23 DIAGNOSIS — M25611 Stiffness of right shoulder, not elsewhere classified: Secondary | ICD-10-CM

## 2019-05-23 NOTE — Therapy (Signed)
[place], [name], [address] Phone: [phone]   Fax:  [phone]  Occupational Therapy Treatment  Patient Details  Name: Sabrina Deleon MRN: [id number] Date of Birth: [birth year] Referring Provider (OT): Dr. Jeannie Fend   Encounter Date: 05/23/2019  OT End of Session - 05/23/19 1130    Visit Number  36    Number of Visits  36    Date for OT Re-Evaluation  05/26/19    Authorization Type  workers compensation 7 additional visits authorized 2/24-3/17    Authorization - Visit Number  4    Authorization - Number of Visits  7    OT Start Time  0948    OT Stop Time  1027    OT Time Calculation (min)  39 min    Activity Tolerance  Patient tolerated treatment well    Behavior During Therapy  [facility] for tasks assessed/performed       Past Medical History:  Diagnosis Date  . Anxiety   . Aortic stenosis   . Arthritis   . Chronic kidney disease    Dr. Tami Ribas- noted increase kidney function levels- took pt off Metformin and started on Onglyza for Blood sugar control-pt being followed for this.  Marland Kitchen GERD (gastroesophageal reflux disease)   . Gout   . Heart murmur   . Hypertension   . Hypothyroidism   . Morbid obesity ([place]) 03/29/2018   Patient has elevated BMI with diabetes hyperlipidemia and hypertension  . Pancreatitis   . Type 2 diabetes mellitus ([place])   . Wears glasses   . Wears partial dentures    Upper    Past Surgical History:  Procedure Laterality Date  . BACK SURGERY     Lumbar  . CARPAL TUNNEL RELEASE  11/02/2011   Procedure: CARPAL TUNNEL RELEASE;  Surgeon: Tennis Must, MD;  Location: [place];  Service: Orthopedics;  Laterality: Right;  . CARPAL TUNNEL RELEASE Left 01/14/2015   Procedure: LEFT CARPAL TUNNEL RELEASE;  Surgeon: Leanora Cover, MD;  Location: [place];  Service: Orthopedics;  Laterality: Left;  . CHOLECYSTECTOMY     laparoscopic  . COLONOSCOPY    .  COLONOSCOPY N/A 11/12/2015   Procedure: COLONOSCOPY;  Surgeon: Rogene Houston, MD;  [facility];  Service: Endoscopy;  Laterality: N/A;  9:30  . ESOPHAGOGASTRODUODENOSCOPY N/A 11/12/2015   Procedure: ESOPHAGOGASTRODUODENOSCOPY (EGD);  Surgeon: Rogene Houston, MD;  [facility];  Service: Endoscopy;  Laterality: N/A;  . REVERSE SHOULDER ARTHROPLASTY Right 11/04/2018  . REVERSE SHOULDER ARTHROPLASTY Right 11/04/2018   Procedure: REVERSE SHOULDER ARTHROPLASTY;  Surgeon: Verner Mould, MD;  Location: [place];  Service: Orthopedics;  Laterality: Right;  . RIGHT/LEFT HEART CATH AND CORONARY ANGIOGRAPHY N/A 10/18/2017   Procedure: RIGHT/LEFT HEART CATH AND CORONARY ANGIOGRAPHY;  Surgeon: Burnell Blanks, MD;  Location: [place] [facility];  Service: Cardiovascular;  Laterality: N/A;  . TOTAL HIP ARTHROPLASTY Left 03/22/2016   Procedure: LEFT TOTAL HIP ARTHROPLASTY ANTERIOR APPROACH;  Surgeon: Gaynelle Arabian, MD;  [facility];  Service: Orthopedics;  Laterality: Left;    There were no vitals filed for this visit.  Subjective Assessment - 05/23/19 0950    Subjective   S: I have no pain right now    Currently in Pain?  No/denies    Pain Score  0-No pain         [facility] OT Assessment - 05/23/19 1021  Assessment   Medical Diagnosis  S/P Right Reverse Total Shoulder Replacement      Precautions   Precautions  Shoulder    Type of Shoulder Precautions  Progress as tolerated               OT Treatments/Exercises (OP) - 05/23/19 0951      Exercises   Exercises  Shoulder      Shoulder Exercises: Standing   Protraction  Theraband;10 reps    Theraband Level (Shoulder Protraction)  Level 2 (Red)    Horizontal ABduction  Theraband;12 reps    Theraband Level (Shoulder Horizontal ABduction)  Level 2 (Red)    External Rotation  Theraband;10 reps    Theraband Level (Shoulder External Rotation)  Level 2 (Red)    Internal Rotation  Theraband;10 reps     Theraband Level (Shoulder Internal Rotation)  Level 2 (Red)    Flexion  Theraband;12 reps    Theraband Level (Shoulder Flexion)  Level 2 (Red)    ABduction  Theraband;10 reps    Theraband Level (Shoulder ABduction)  Level 2 (Red)      Shoulder Exercises: Therapy Ball   Other Therapy Ball Exercises  green therapy ball w/ 1lb wrist weights: chest press, flexion, circles each direction, 15X each      Shoulder Exercises: ROM/Strengthening   Ball on Wall  1' flexion 1' abduction    Other ROM/Strengthening Exercises  Using pink ball; 10 passess each direction; behind back then behind head      Shoulder Exercises: Stretch   Internal Rotation Stretch  2 reps   20 sec horizontal towel   Wall Stretch - Flexion  2 reps;20 seconds    Wall Stretch - ABduction  2 reps;20 seconds      Shoulder Exercises: Body Blade   Flexion  5 reps    ABduction  5 reps    External Rotation  5 reps    Internal Rotation  5 reps               OT Short Term Goals - 02/21/19 0945      OT SHORT TERM GOAL #1   Title  Patient will be educated and independent with HEP for right arm mobility and strength.    Time  4    Period  Weeks    Target Date  01/22/19      OT SHORT TERM GOAL #2   Title  Patient will improve right shoulder P/ROM to [facility] in order to don shirts and pants with increased independence.    Time  4    Period  Weeks      OT SHORT TERM GOAL #3   Title  Patient will increase right shoulder strength to 3+/5 for increased ability to lift laundry baskets, her dogs, and items at work.    Time  4    Period  Weeks      OT SHORT TERM GOAL #4   Title  Patient will decrease pain in right shoulder to 5/10 or less during completing ADL tasks.    Time  4    Period  Weeks      OT SHORT TERM GOAL #5   Title  Patient will decrease fascial and scar restrictions to moderate in right upper arm for increased mobility and less pain.    Time  4    Period  Weeks        OT Long Term Goals - 04/28/19  1135  OT LONG TERM GOAL #1   Title  Patient will return to prior level of independence and use of right arm as dominant with all B/IADLs, work, and leisure tasks.    Baseline  2/8: Still needs help getting camisoles on. Sometimes needs assistance to pull her right arm out a long sleeve shirt.    Time  8    Period  Weeks    Status  On-going      OT LONG TERM GOAL #2   Title  Patient will increase A/ROM to [facility] in right shoulder for improved ability to comb her hair, tuck shirts in, and reach into overhead cabinets.    Time  8    Period  Weeks    Status  On-going      OT LONG TERM GOAL #3   Title  Patient will increase right shoulder strength to 4/5 or better for improved ability to lift boxes of drinks at work.    Time  8    Period  Weeks    Status  Partially Met      OT LONG TERM GOAL #4   Title  Patient will decrease right shoulder pain to 2/10 or better for improved ability to complete daily tasks.    Baseline  2/8: 4/10 at work. Can play through a song on the piano and will need to rest for the normal amount of time between songs.    Time  8    Period  Weeks    Status  On-going      OT LONG TERM GOAL #5   Title  Patient will decrease fascial restrictions in right shoulder region to minimal for improved ability to complete daily tasks with less pain.    Time  8    Period  Weeks            Plan - 05/23/19 1131    Clinical Impression Statement  A: Continued with body blade work, pt with good control today. Continued with therapy ball exercises and red theraband strengthening. Discussed plan to reassess and discharge next session, pt is in agreement.    Body Structure / Function / Physical Skills  ADL;Muscle spasms;UE functional use;Fascial restriction;Pain;Strength;ROM    Plan  P: Reassess and discharge with updated HEP    OT Home Exercise Plan  12/25/18:  table slides       Patient will benefit from skilled therapeutic intervention in order to improve the following  deficits and impairments:   Body Structure / Function / Physical Skills: ADL, Muscle spasms, UE functional use, Fascial restriction, Pain, Strength, ROM       Visit Diagnosis: Acute pain of right shoulder  Stiffness of right shoulder, not elsewhere classified  Other symptoms and signs involving the musculoskeletal system    Problem List Patient Active Problem List   Diagnosis Date Noted  . Closed fracture of right proximal humerus 11/04/2018  . Primary osteoarthritis involving multiple joints 09/12/2018  . Morbid obesity ([place]) 03/29/2018  . Severe aortic stenosis   . Iron deficiency anemia 10/13/2017  . Anxiety and depression 04/28/2017  . Encounter for long-term opiate analgesic use 04/28/2017  . Psychophysiological insomnia 04/28/2017  . OA (osteoarthritis) of hip 03/22/2016  . Chronic right hip pain 10/29/2015  . Chronic kidney disease (CKD) stage G3b/A1, moderately decreased glomerular filtration rate (GFR) between 30-44 mL/min/1.73 square meter and albuminuria creatinine ratio less than 30 mg/g 06/03/2015  . Type 2 diabetes mellitus ([place]) 11/09/2014  . Diastolic dysfunction, left  ventricle 05/16/2013  . Calcific aortic stenosis 05/16/2013  . Gout 05/12/2013  . Diabetic retinopathy ([place]) 12/17/2012  . Hypertension 06/10/2012  . Hyperlipidemia 06/10/2012  . Hypothyroidism 06/10/2012   Guadelupe Sabin, OTR/L  [phone] 05/23/2019, 11:33 AM  [place] [address] [place], [name], [address] Phone: [phone]   Fax:  [phone]  Name: Sabrina Deleon MRN: [id number] Date of Birth: [birth year]

## 2019-05-28 ENCOUNTER — Encounter (HOSPITAL_COMMUNITY): Payer: Medicare Other | Admitting: Occupational Therapy

## 2019-05-29 DIAGNOSIS — H43812 Vitreous degeneration, left eye: Secondary | ICD-10-CM | POA: Diagnosis not present

## 2019-05-30 ENCOUNTER — Other Ambulatory Visit: Payer: Self-pay

## 2019-05-30 ENCOUNTER — Encounter (HOSPITAL_COMMUNITY): Payer: Self-pay | Admitting: Occupational Therapy

## 2019-05-30 ENCOUNTER — Ambulatory Visit (HOSPITAL_COMMUNITY): Payer: Worker's Compensation | Admitting: Occupational Therapy

## 2019-05-30 DIAGNOSIS — M25611 Stiffness of right shoulder, not elsewhere classified: Secondary | ICD-10-CM | POA: Diagnosis not present

## 2019-05-30 DIAGNOSIS — R29898 Other symptoms and signs involving the musculoskeletal system: Secondary | ICD-10-CM

## 2019-05-30 DIAGNOSIS — M25511 Pain in right shoulder: Secondary | ICD-10-CM

## 2019-05-30 NOTE — Therapy (Addendum)
[place], [name], [address] Phone: [phone]   Fax:  [phone]  Occupational Therapy Reassessment, Treatment, Discharge  Patient Details  Name: Sabrina Deleon MRN: [id number] Date of Birth: 01/21/1951 Referring Provider (OT): Dr. Jeannie Fend   Encounter Date: 05/30/2019  OT End of Session - 05/30/19 1150    Visit Number  37    Number of Visits  37    Date for OT Re-Evaluation  05/26/19    Authorization Type  workers compensation 7 additional visits authorized 2/24-3/17    Authorization - Visit Number  5    Authorization - Number of Visits  7    OT Start Time  1118    OT Stop Time  1149    OT Time Calculation (min)  31 min    Activity Tolerance  Patient tolerated treatment well    Behavior During Therapy  [facility] for tasks assessed/performed       Past Medical History:  Diagnosis Date  . Anxiety   . Aortic stenosis   . Arthritis   . Chronic kidney disease    Dr. Tami Ribas- noted increase kidney function levels- took pt off Metformin and started on Onglyza for Blood sugar control-pt being followed for this.  Marland Kitchen GERD (gastroesophageal reflux disease)   . Gout   . Heart murmur   . Hypertension   . Hypothyroidism   . Morbid obesity ([place]) 03/29/2018   Patient has elevated BMI with diabetes hyperlipidemia and hypertension  . Pancreatitis   . Type 2 diabetes mellitus ([place])   . Wears glasses   . Wears partial dentures    Upper    Past Surgical History:  Procedure Laterality Date  . BACK SURGERY     Lumbar  . CARPAL TUNNEL RELEASE  11/02/2011   Procedure: CARPAL TUNNEL RELEASE;  Surgeon: Tennis Must, MD;  Location: [place];  Service: Orthopedics;  Laterality: Right;  . CARPAL TUNNEL RELEASE Left 01/14/2015   Procedure: LEFT CARPAL TUNNEL RELEASE;  Surgeon: Leanora Cover, MD;  Location: [place];  Service: Orthopedics;  Laterality: Left;  . CHOLECYSTECTOMY     laparoscopic   . COLONOSCOPY    . COLONOSCOPY N/A 11/12/2015   Procedure: COLONOSCOPY;  Surgeon: Rogene Houston, MD;  [facility];  Service: Endoscopy;  Laterality: N/A;  9:30  . ESOPHAGOGASTRODUODENOSCOPY N/A 11/12/2015   Procedure: ESOPHAGOGASTRODUODENOSCOPY (EGD);  Surgeon: Rogene Houston, MD;  [facility];  Service: Endoscopy;  Laterality: N/A;  . REVERSE SHOULDER ARTHROPLASTY Right 11/04/2018  . REVERSE SHOULDER ARTHROPLASTY Right 11/04/2018   Procedure: REVERSE SHOULDER ARTHROPLASTY;  Surgeon: Verner Mould, MD;  Location: [place];  Service: Orthopedics;  Laterality: Right;  . RIGHT/LEFT HEART CATH AND CORONARY ANGIOGRAPHY N/A 10/18/2017   Procedure: RIGHT/LEFT HEART CATH AND CORONARY ANGIOGRAPHY;  Surgeon: Burnell Blanks, MD;  Location: [place] [facility];  Service: Cardiovascular;  Laterality: N/A;  . TOTAL HIP ARTHROPLASTY Left 03/22/2016   Procedure: LEFT TOTAL HIP ARTHROPLASTY ANTERIOR APPROACH;  Surgeon: Gaynelle Arabian, MD;  [facility];  Service: Orthopedics;  Laterality: Left;    There were no vitals filed for this visit.  Subjective Assessment - 05/30/19 1123    Subjective   S: It's all about the same to me.    Currently in Pain?  Yes    Pain Score  2     Pain Location  Shoulder    Pain Orientation  Right    Pain Descriptors / Indicators  Sore    Pain Type  Acute pain    Pain Radiating Towards  N/A    Pain Onset  In the past 7 days    Pain Frequency  Intermittent    Aggravating Factors   stretching, movements    Pain Relieving Factors  heat, rest    Effect of Pain on Daily Activities  min effect on ADLs    Multiple Pain Sites  No         [facility] OT Assessment - 05/30/19 1125      Assessment   Medical Diagnosis  S/P Right Reverse Total Shoulder Replacement      Precautions   Precautions  Shoulder    Type of Shoulder Precautions  Progress as tolerated      Observation/Other Assessments   Focus on Therapeutic Outcomes (FOTO)   59/100    same as previous     AROM   Overall AROM Comments  assessed in seated, external and internal rotation with arm adducted    AROM Assessment Site  Shoulder    Right/Left Shoulder  Right    Right Shoulder Flexion  113 Degrees   100 previous   Right Shoulder ABduction  95 Degrees   same as previous   Right Shoulder Internal Rotation  90 Degrees   same as previous   Right Shoulder External Rotation  40 Degrees   same as previous     Strength   Overall Strength Comments  assessed seated, er/IR adducted    Strength Assessment Site  Shoulder    Right/Left Shoulder  Right    Right Shoulder Flexion  4/5   same as previous   Right Shoulder ABduction  4+/5   same as previous   Right Shoulder Internal Rotation  5/5   same as previous   Right Shoulder External Rotation  3+/5   same as previous              OT Treatments/Exercises (OP) - 05/30/19 1125      Exercises   Exercises  Shoulder      Shoulder Exercises: Standing   Protraction  Theraband;10 reps    Theraband Level (Shoulder Protraction)  Level 3 (Green)    Horizontal ABduction  Theraband;12 reps    Theraband Level (Shoulder Horizontal ABduction)  Level 3 (Green)    External Rotation  Theraband;10 reps    Theraband Level (Shoulder External Rotation)  Level 3 (Green)    Internal Rotation  Theraband;10 reps    Theraband Level (Shoulder Internal Rotation)  Level 3 (Green)    Flexion  Theraband;12 reps    Theraband Level (Shoulder Flexion)  Level 3 (Green)    ABduction  Theraband;10 reps    Theraband Level (Shoulder ABduction)  Level 3 (Green)      Shoulder Exercises: Stretch   Internal Rotation Stretch  2 reps   20" holds, horizontal towel   Wall Stretch - Flexion  2 reps;20 seconds    Wall Stretch - ABduction  2 reps;20 seconds             OT Education - 05/30/19 1149    Education Details  educated on continuing HEP and resuming stretches to maintain current ROM    Person(s) Educated  Patient     Methods  Explanation    Comprehension  Verbalized understanding       OT Short Term Goals - 05/30/19 1150      OT SHORT  TERM GOAL #1   Title  Patient will be educated and independent with HEP for right arm mobility and strength.    Time  4    Period  Weeks    Target Date  01/22/19      OT SHORT TERM GOAL #2   Title  Patient will improve right shoulder P/ROM to [facility] in order to don shirts and pants with increased independence.    Time  4    Period  Weeks      OT SHORT TERM GOAL #3   Title  Patient will increase right shoulder strength to 3+/5 for increased ability to lift laundry baskets, her dogs, and items at work.    Time  4    Period  Weeks      OT SHORT TERM GOAL #4   Title  Patient will decrease pain in right shoulder to 5/10 or less during completing ADL tasks.    Time  4    Period  Weeks      OT SHORT TERM GOAL #5   Title  Patient will decrease fascial and scar restrictions to moderate in right upper arm for increased mobility and less pain.    Time  4    Period  Weeks        OT Long Term Goals - 05/30/19 1150      OT LONG TERM GOAL #1   Title  Patient will return to prior level of independence and use of right arm as dominant with all B/IADLs, work, and leisure tasks.    Baseline  2/8: Still needs help getting camisoles on. Sometimes needs assistance to pull her right arm out a long sleeve shirt.    Time  8    Period  Weeks    Status  Partially Met      OT LONG TERM GOAL #2   Title  Patient will increase A/ROM to [facility] in right shoulder for improved ability to comb her hair, tuck shirts in, and reach into overhead cabinets.    Time  8    Period  Weeks    Status  Partially Met      OT LONG TERM GOAL #3   Title  Patient will increase right shoulder strength to 4/5 or better for improved ability to lift boxes of drinks at work.    Time  8    Period  Weeks    Status  Partially Met      OT LONG TERM GOAL #4   Title  Patient will decrease right shoulder pain  to 2/10 or better for improved ability to complete daily tasks.    Baseline  2/8: 4/10 at work. Can play through a song on the piano and will need to rest for the normal amount of time between songs.    Time  8    Period  Weeks    Status  Achieved      OT LONG TERM GOAL #5   Title  Patient will decrease fascial restrictions in right shoulder region to minimal for improved ability to complete daily tasks with less pain.    Time  8    Period  Weeks            Plan - 05/30/19 1152    Clinical Impression Statement  A: Reassessment completed this session. Pt is maintaining ROM and strength, has plateaued in terms of progressing further due to pain and joint ROM limitations secondary to extent of injury  and surgery. Reviewed current HEP and educated on resuming shoulder stretching to maintain current ROM. Pt reports she continues to require occasional assistance with sleeves and is unable to complete lifting tasks.    Body Structure / Function / Physical Skills  ADL;Muscle spasms;UE functional use;Fascial restriction;Pain;Strength;ROM    Plan  P: Discharge pt    Consulted and Agree with Plan of Care  Patient       Patient will benefit from skilled therapeutic intervention in order to improve the following deficits and impairments:   Body Structure / Function / Physical Skills: ADL, Muscle spasms, UE functional use, Fascial restriction, Pain, Strength, ROM       Visit Diagnosis: Acute pain of right shoulder  Stiffness of right shoulder, not elsewhere classified  Other symptoms and signs involving the musculoskeletal system    Problem List Patient Active Problem List   Diagnosis Date Noted  . Closed fracture of right proximal humerus 11/04/2018  . Primary osteoarthritis involving multiple joints 09/12/2018  . Morbid obesity ([place]) 03/29/2018  . Severe aortic stenosis   . Iron deficiency anemia 10/13/2017  . Anxiety and depression 04/28/2017  . Encounter for long-term opiate  analgesic use 04/28/2017  . Psychophysiological insomnia 04/28/2017  . OA (osteoarthritis) of hip 03/22/2016  . Chronic right hip pain 10/29/2015  . Chronic kidney disease (CKD) stage G3b/A1, moderately decreased glomerular filtration rate (GFR) between 30-44 mL/min/1.73 square meter and albuminuria creatinine ratio less than 30 mg/g 06/03/2015  . Type 2 diabetes mellitus ([place]) 11/09/2014  . Diastolic dysfunction, left ventricle 05/16/2013  . Calcific aortic stenosis 05/16/2013  . Gout 05/12/2013  . Diabetic retinopathy ([place]) 12/17/2012  . Hypertension 06/10/2012  . Hyperlipidemia 06/10/2012  . Hypothyroidism 06/10/2012   Guadelupe Sabin, OTR/L  [phone] 05/30/2019, 11:57 AM  [place] [address] [place], [name], [address] Phone: [phone]   Fax:  [phone]  Name: Sabrina Deleon MRN: [id number] Date of Birth: Jul 06, 1950   OCCUPATIONAL THERAPY DISCHARGE SUMMARY  Visits from Start of Care: 37  Current functional level related to goals / functional outcomes: See above.    Remaining deficits: ROM limited to ~50%, decreased strength, intermittent pain   Education / Equipment: HEP Plan: Patient agrees to discharge.  Patient goals were partially met. Patient is being discharged due to lack of progress.  ?????

## 2019-06-04 ENCOUNTER — Ambulatory Visit (HOSPITAL_COMMUNITY): Payer: Worker's Compensation | Admitting: Occupational Therapy

## 2019-06-05 ENCOUNTER — Telehealth: Payer: Self-pay | Admitting: Family Medicine

## 2019-06-05 ENCOUNTER — Other Ambulatory Visit: Payer: Self-pay | Admitting: Family Medicine

## 2019-06-05 MED ORDER — HYDROCODONE-ACETAMINOPHEN 5-325 MG PO TABS: ORAL_TABLET | ORAL | 0 refills | Status: DC

## 2019-06-05 NOTE — Telephone Encounter (Signed)
Patient is requesting refill on hydrocodone 5/325 called into [facility]

## 2019-06-05 NOTE — Telephone Encounter (Signed)
Last med check 04/25/19

## 2019-06-06 ENCOUNTER — Other Ambulatory Visit: Payer: Self-pay | Admitting: Family Medicine

## 2019-06-06 ENCOUNTER — Encounter (HOSPITAL_COMMUNITY): Payer: Medicare Other | Admitting: Occupational Therapy

## 2019-06-06 NOTE — Telephone Encounter (Signed)
This was sent in yesterday

## 2019-06-06 NOTE — Telephone Encounter (Signed)
Pt notified on voicemail that rx requested was sent in

## 2019-06-24 ENCOUNTER — Other Ambulatory Visit: Payer: Self-pay | Admitting: *Deleted

## 2019-06-24 MED ORDER — PANTOPRAZOLE SODIUM 40 MG PO TBEC: 40.0000 mg | DELAYED_RELEASE_TABLET | Freq: Every day | ORAL | 0 refills | Status: DC

## 2019-06-24 MED ORDER — LEVOTHYROXINE SODIUM 137 MCG PO TABS: ORAL_TABLET | ORAL | 0 refills | Status: DC

## 2019-06-24 MED ORDER — ZOLPIDEM TARTRATE 5 MG PO TABS: ORAL_TABLET | ORAL | 2 refills | Status: DC

## 2019-06-24 MED ORDER — ALLOPURINOL 100 MG PO TABS: 100.0000 mg | ORAL_TABLET | Freq: Two times a day (BID) | ORAL | 0 refills | Status: DC

## 2019-06-24 MED ORDER — FUROSEMIDE 40 MG PO TABS: ORAL_TABLET | ORAL | 0 refills | Status: DC

## 2019-06-24 MED ORDER — LISINOPRIL 20 MG PO TABS: 20.0000 mg | ORAL_TABLET | Freq: Every day | ORAL | 0 refills | Status: DC

## 2019-06-24 MED ORDER — PRAVASTATIN SODIUM 40 MG PO TABS: ORAL_TABLET | ORAL | 0 refills | Status: DC

## 2019-06-24 MED ORDER — GLIPIZIDE 5 MG PO TABS: 2.5000 mg | ORAL_TABLET | Freq: Two times a day (BID) | ORAL | 0 refills | Status: DC

## 2019-06-30 ENCOUNTER — Other Ambulatory Visit: Payer: Self-pay | Admitting: Family Medicine

## 2019-06-30 ENCOUNTER — Telehealth: Payer: Self-pay | Admitting: Family Medicine

## 2019-06-30 MED ORDER — COLCHICINE 0.6 MG PO TABS: ORAL_TABLET | ORAL | 1 refills | Status: DC

## 2019-06-30 NOTE — Telephone Encounter (Signed)
Nurses please talk with patient How many days has she had a flareup of her gout? Where is it located?  Any fevers? She is currently on allopurinol daily to minimize uric acid Colchicine is typically used to help with a flareup Is that the medication she is speaking of?

## 2019-06-30 NOTE — Telephone Encounter (Signed)
Please advise. Thank you

## 2019-06-30 NOTE — Telephone Encounter (Signed)
Medication was sent in as requested 

## 2019-06-30 NOTE — Telephone Encounter (Signed)
Pt is not having any fevers. She has about 1-2 flare ups in her foot a year. She is currently taking the Allopurinol daily. Pt is requesting colchicine. Pt states after she takes a few pills of the colchicine she is fine. Please advise. Thank you.

## 2019-06-30 NOTE — Telephone Encounter (Signed)
Pt is aware and verbalized understanding.

## 2019-06-30 NOTE — Telephone Encounter (Signed)
Patient is requesting refill on gout medication due to flare uo in her foot. [address]

## 2019-07-04 ENCOUNTER — Telehealth: Payer: Self-pay | Admitting: Nurse Practitioner

## 2019-07-04 ENCOUNTER — Other Ambulatory Visit: Payer: Self-pay | Admitting: Nurse Practitioner

## 2019-07-04 NOTE — Telephone Encounter (Signed)
Please advise. Thank you

## 2019-07-04 NOTE — Telephone Encounter (Signed)
Would she want to try something else since this will raise her sugar? Also is she taking her allopurinol? Thanks.

## 2019-07-04 NOTE — Telephone Encounter (Signed)
Patient has flare up with gout in foot some better but requesting short course of prednisone to knock it out. [facility]

## 2019-07-04 NOTE — Telephone Encounter (Signed)
Pt states that Prednisone is the only medication that works. Pt is taking Allopurinol and Colchicine. Please advise. Thank you

## 2019-07-05 ENCOUNTER — Other Ambulatory Visit: Payer: Self-pay | Admitting: Nurse Practitioner

## 2019-07-05 MED ORDER — PREDNISONE 20 MG PO TABS: ORAL_TABLET | ORAL | 0 refills | Status: DC

## 2019-07-05 NOTE — Telephone Encounter (Signed)
Done

## 2019-07-16 ENCOUNTER — Telehealth: Payer: Self-pay | Admitting: Family Medicine

## 2019-07-16 NOTE — Telephone Encounter (Signed)
Patient is requesting refill on hydrocodone last seen 04/25/2019. [facility]

## 2019-07-16 NOTE — Telephone Encounter (Signed)
Med pended

## 2019-07-16 NOTE — Telephone Encounter (Signed)
Last med check up 04/25/19

## 2019-07-16 NOTE — Telephone Encounter (Signed)
May pend the medicine and then send back to me thank you

## 2019-07-17 MED ORDER — HYDROCODONE-ACETAMINOPHEN 5-325 MG PO TABS: ORAL_TABLET | ORAL | 0 refills | Status: DC

## 2019-07-17 NOTE — Telephone Encounter (Signed)
Called and discussed with pt. Pt transferred up to make appt.

## 2019-07-17 NOTE — Telephone Encounter (Signed)
Medication was sent as requested Being that it is a controlled medication she will need to do an office visit every 3 months with narcotics based on current guidelines from [place] so therefore on the next prescription she will need to be doing an office visit she should be aware of this

## 2019-07-29 ENCOUNTER — Other Ambulatory Visit: Payer: Self-pay | Admitting: Family Medicine

## 2019-07-30 NOTE — Telephone Encounter (Signed)
Needs appt for follow up with pcp for more refills, 30 day given.  Thx, dr. Lovena Le

## 2019-07-30 NOTE — Telephone Encounter (Signed)
Med check up on 05/15/19

## 2019-08-11 ENCOUNTER — Other Ambulatory Visit: Payer: Self-pay | Admitting: Family Medicine

## 2019-08-15 ENCOUNTER — Telehealth: Payer: Self-pay | Admitting: Family Medicine

## 2019-08-15 ENCOUNTER — Other Ambulatory Visit: Payer: Self-pay

## 2019-08-15 ENCOUNTER — Telehealth (INDEPENDENT_AMBULATORY_CARE_PROVIDER_SITE_OTHER): Payer: Medicare Other | Admitting: Family Medicine

## 2019-08-15 DIAGNOSIS — I10 Essential (primary) hypertension: Secondary | ICD-10-CM | POA: Diagnosis not present

## 2019-08-15 DIAGNOSIS — E1121 Type 2 diabetes mellitus with diabetic nephropathy: Secondary | ICD-10-CM | POA: Diagnosis not present

## 2019-08-15 DIAGNOSIS — E039 Hypothyroidism, unspecified: Secondary | ICD-10-CM

## 2019-08-15 DIAGNOSIS — N1832 Chronic kidney disease, stage 3b: Secondary | ICD-10-CM

## 2019-08-15 DIAGNOSIS — E1122 Type 2 diabetes mellitus with diabetic chronic kidney disease: Secondary | ICD-10-CM

## 2019-08-15 MED ORDER — CITALOPRAM HYDROBROMIDE 20 MG PO TABS: 30.0000 mg | ORAL_TABLET | Freq: Every day | ORAL | 1 refills | Status: DC

## 2019-08-15 MED ORDER — PANTOPRAZOLE SODIUM 40 MG PO TBEC: 40.0000 mg | DELAYED_RELEASE_TABLET | Freq: Every day | ORAL | 1 refills | Status: DC

## 2019-08-15 MED ORDER — LISINOPRIL 20 MG PO TABS: 20.0000 mg | ORAL_TABLET | Freq: Every day | ORAL | 1 refills | Status: DC

## 2019-08-15 MED ORDER — ALLOPURINOL 100 MG PO TABS: 100.0000 mg | ORAL_TABLET | Freq: Two times a day (BID) | ORAL | 1 refills | Status: DC

## 2019-08-15 MED ORDER — FUROSEMIDE 40 MG PO TABS: ORAL_TABLET | ORAL | 1 refills | Status: DC

## 2019-08-15 MED ORDER — GLIPIZIDE 5 MG PO TABS: 2.5000 mg | ORAL_TABLET | Freq: Two times a day (BID) | ORAL | 1 refills | Status: DC

## 2019-08-15 MED ORDER — LEVOTHYROXINE SODIUM 137 MCG PO TABS: ORAL_TABLET | ORAL | 1 refills | Status: DC

## 2019-08-15 NOTE — Telephone Encounter (Signed)
[name], cranmer are scheduled for a virtual visit with your provider today.    Just as we do with appointments in the office, we must obtain your consent to participate.  Your consent will be active for this visit and any virtual visit you may have with one of our providers in the next 365 days.    If you have a MyChart account, I can also send a copy of this consent to you electronically.  All virtual visits are billed to your insurance company just like a traditional visit in the office.  As this is a virtual visit, video technology does not allow for your provider to perform a traditional examination.  This may limit your provider's ability to fully assess your condition.  If your provider identifies any concerns that need to be evaluated in person or the need to arrange testing such as labs, EKG, etc, we will make arrangements to do so.    Although advances in technology are sophisticated, we cannot ensure that it will always work on either your end or our end.  If the connection with a video visit is poor, we may have to switch to a telephone visit.  With either a video or telephone visit, we are not always able to ensure that we have a secure connection.   I need to obtain your verbal consent now.   Are you willing to proceed with your visit today?   Sabrina Deleon has provided verbal consent on 08/15/2019 for a virtual visit (video or telephone).   [name], LPN [id number]  [id number] PM

## 2019-08-15 NOTE — Progress Notes (Signed)
Subjective:    Patient ID: Sabrina Deleon, female    DOB: 12-Jul-1950, 69 y.o.   MRN: [id number]  HPI This patient was seen today for chronic pain  The medication list was reviewed and updated.   -Compliance with medication: Hydrocodone 5-325  - Number patient states they take daily: 1/2 tablet BID   -when was the last dose patient took? This morning  The patient was advised the importance of maintaining medication and not using illegal substances with these.  Here for refills and follow up  The patient was educated that we can provide 3 monthly scripts for their medication, it is their responsibility to follow the instructions.  Side effects or complications from medications: none  Patient is aware that pain medications are meant to minimize the severity of the pain to allow their pain levels to improve to allow for better function. They are aware of that pain medications cannot totally remove their pain. Unable to do urine drug screen today because this had to be a virtual visit because of her respiratory issues  The patient does state that the pain medicine does help her function without it she is having very difficult time Due for UDT ( at least once per year) :   Virtual Visit via Telephone Note  I connected with Sabrina Deleon on 08/15/19 at  2:00 PM EDT by telephone and verified that I am speaking with the correct person using two identifiers.  Location: Patient: home Provider: office   I discussed the limitations, risks, security and privacy concerns of performing an evaluation and management service by telephone and the availability of in person appointments. I also discussed with the patient that there may be a patient responsible charge related to this service. The patient expressed understanding and agreed to proceed.   History of Present Illness:    Observations/Objective:   Assessment and Plan:   Follow Up Instructions:    I discussed the assessment  and treatment plan with the patient. The patient was provided an opportunity to ask questions and all were answered. The patient agreed with the plan and demonstrated an understanding of the instructions.   The patient was advised to call back or seek an in-person evaluation if the symptoms worsen or if the condition fails to improve as anticipated.  I provided 20 minutes of non-face-to-face time during this encounter.   Results for orders placed or performed in visit on 05/09/19  HM DIABETES EYE EXAM  Result Value Ref Range   HM Diabetic Eye Exam Retinopathy (A) No Retinopathy           Review of Systems  Constitutional: Negative for activity change, appetite change and fatigue.  HENT: Negative for congestion and rhinorrhea.   Respiratory: Negative for cough and shortness of breath.   Cardiovascular: Negative for chest pain and leg swelling.  Gastrointestinal: Negative for abdominal pain and diarrhea.  Endocrine: Negative for polydipsia and polyphagia.  Musculoskeletal: Positive for back pain. Negative for arthralgias.  Skin: Negative for color change.  Neurological: Negative for dizziness and weakness.  Psychiatric/Behavioral: Negative for behavioral problems and confusion.  Today's visit was via telephone Physical exam was not possible for this visit      Objective:    Today's visit was via telephone Physical exam was not possible for this visit        Assessment & Plan:  The patient was seen in followup for chronic pain. A review over at their current pain status was  discussed. Drug registry was checked. Prescriptions were given.  Regular follow-up recommended. Discussion was held regarding the importance of compliance with medication as well as pain medication contract.  Patient was informed that medication may cause drowsiness and should not be combined  with other medications/alcohol or street drugs. If the patient feels medication is causing altered alertness  then do not drive or operate dangerous equipment.  The patient states she takes her pain medicine during the day she does not take it at night  She does state it helps her with her functioning therefore we will continue it  Patient has severe insomnia and takes Ambien at nighttime.  I have told this patient that for right now we will allow this but we are seriously looking at avoiding this combination with pain medicine.  We will be looking into other alternatives and by later this year will be getting away from this she states she understands but states that she cannot sleep without the medicine.  She also states she does not take the medicine close to her pain medicine and does not feel it is causing her problem  Her next visit will be in person in 3 months

## 2019-08-16 MED ORDER — HYDROCODONE-ACETAMINOPHEN 5-325 MG PO TABS: ORAL_TABLET | ORAL | 0 refills | Status: DC

## 2019-08-16 MED ORDER — ZOLPIDEM TARTRATE 5 MG PO TABS: ORAL_TABLET | ORAL | 2 refills | Status: DC

## 2019-08-27 ENCOUNTER — Other Ambulatory Visit: Payer: Self-pay | Admitting: *Deleted

## 2019-08-27 NOTE — Patient Outreach (Signed)
[place]) Care Management  08/27/2019  Sabrina Deleon 08-Nov-1950 [id number]   Telephone Screen  Referral Date:  08/26/2019 Referral Source:  EMMI Prevent Reason for Referral:  EMMI Prevent Screening/Join Care Management Insurance:  [employer]   Outreach Attempt:  Outreach attempt #1 to patient for screening. No answer and unable to leave voicemail message due to voicemail not engaging.  Plan:  RN Health Coach will send unsuccessful outreach letter to patient.  RN Health Coach will make another outreach attempt to patient within 3-4 business days if no return call back from patient.   [place] [phone] [email or web address]

## 2019-08-29 ENCOUNTER — Other Ambulatory Visit: Payer: Self-pay | Admitting: *Deleted

## 2019-08-29 NOTE — Patient Outreach (Addendum)
[facility] [facility]) Care Management  08/29/2019  SAHIRAH RUDELL 1950-04-22 [id number]   Telephone Screen  Referral Date:  08/26/2019 Referral Source:  EMMI Prevent Reason for Referral:  EMMI Prevent Screening/Join Care Management Insurance:  [employer]   Addendum:  Received telephone call back from patient.  RN Health Coach introduced self and role.  [facility] services reviewed and discussed with patient.  Patient stating she feels her care is very well managed at this time and declines [facility] services.  Does agree to receive [facility] Pamphlet in the mail and agrees to follow up call in 3 months to [employer] services.  RN Health Coach will close case and mail patient [facility] Brochure.  RN Health Coach will make follow up call to offer services within the month of September per patient's request.    Outreach Attempt:   Outreach attempt #2 to patient for telephone screening. No answer. RN Health Coach left HIPAA compliant voicemail message along with contact information.  Plan:  RN Health Coach will make another outreach attempt to patient within 3-4 business days if no return call back from patient.   [place] [phone] [email or web address]

## 2019-09-28 ENCOUNTER — Other Ambulatory Visit: Payer: Self-pay | Admitting: Family Medicine

## 2019-09-28 ENCOUNTER — Encounter: Payer: Self-pay | Admitting: Family Medicine

## 2019-11-19 ENCOUNTER — Telehealth: Payer: Medicare Other | Admitting: Nurse Practitioner

## 2019-11-19 ENCOUNTER — Other Ambulatory Visit: Payer: Self-pay | Admitting: Family Medicine

## 2019-11-19 DIAGNOSIS — J0100 Acute maxillary sinusitis, unspecified: Secondary | ICD-10-CM

## 2019-11-19 MED ORDER — AMOXICILLIN-POT CLAVULANATE 875-125 MG PO TABS
1.0000 | ORAL_TABLET | Freq: Two times a day (BID) | ORAL | 0 refills | Status: DC
Start: 2019-11-19 — End: 2019-12-26

## 2019-11-19 NOTE — Progress Notes (Signed)

## 2019-11-19 NOTE — Telephone Encounter (Signed)
Patient needing a refill on Hydrocodone sent to [facility] on [address].  Last refill was 10/11/19.

## 2019-11-19 NOTE — Telephone Encounter (Signed)
Last seen 08/15/19

## 2019-11-19 NOTE — Telephone Encounter (Signed)
May pend also will need a follow-up visit within the next 30 days before further hydrocodone prescription

## 2019-11-20 ENCOUNTER — Other Ambulatory Visit: Payer: Self-pay | Admitting: *Deleted

## 2019-11-20 MED ORDER — HYDROCODONE-ACETAMINOPHEN 5-325 MG PO TABS: ORAL_TABLET | ORAL | 0 refills | Status: DC

## 2019-11-20 NOTE — Patient Outreach (Signed)
[facility]) Care Management  11/20/2019  RAELENE TREW 1950-03-31 [id number]   Telephone Screen  Referral Date:08/26/2019 Referral Source:EMMI Prevent Reason for Referral:EMMI Prevent Screening/Join Care Management Insurance:[employer] Medicare   Outreach Attempt:  Outreach attempt #1 to patient to reoffer [facility] services and possible screening. No answer. RN Health Coach left HIPAA compliant voicemail message along with contact information.  Plan:  RN will make another outreach attempt within the month of October if no return call back from patient.   [place] [phone] [email or web address]

## 2019-11-21 NOTE — Telephone Encounter (Signed)
Pt.notified

## 2019-12-05 DIAGNOSIS — E1121 Type 2 diabetes mellitus with diabetic nephropathy: Secondary | ICD-10-CM | POA: Diagnosis not present

## 2019-12-05 DIAGNOSIS — E785 Hyperlipidemia, unspecified: Secondary | ICD-10-CM | POA: Diagnosis not present

## 2019-12-05 DIAGNOSIS — D509 Iron deficiency anemia, unspecified: Secondary | ICD-10-CM | POA: Diagnosis not present

## 2019-12-05 DIAGNOSIS — N1832 Chronic kidney disease, stage 3b: Secondary | ICD-10-CM | POA: Diagnosis not present

## 2019-12-05 DIAGNOSIS — I10 Essential (primary) hypertension: Secondary | ICD-10-CM | POA: Diagnosis not present

## 2019-12-06 LAB — BASIC METABOLIC PANEL
BUN/Creatinine Ratio: 17 (ref 12–28)
BUN: 22 mg/dL (ref 8–27)
CO2: 28 mmol/L (ref 20–29)
Calcium: 10 mg/dL (ref 8.7–10.3)
Chloride: 101 mmol/L (ref 96–106)
Creatinine, Ser: 1.33 mg/dL — ABNORMAL HIGH (ref 0.57–1.00)
GFR calc Af Amer: 47 mL/min/{1.73_m2} — ABNORMAL LOW (ref >59)
GFR calc non Af Amer: 41 mL/min/{1.73_m2} — ABNORMAL LOW (ref >59)
Glucose: 73 mg/dL (ref 65–99)
Potassium: 5 mmol/L (ref 3.5–5.2)
Sodium: 138 mmol/L (ref 134–144)

## 2019-12-06 LAB — LIPID PANEL
Chol/HDL Ratio: 4.2 ratio (ref 0.0–4.4)
Cholesterol, Total: 163 mg/dL (ref 100–199)
HDL: 39 mg/dL — ABNORMAL LOW (ref >39)
LDL Chol Calc (NIH): 101 mg/dL — ABNORMAL HIGH (ref 0–99)
Triglycerides: 130 mg/dL (ref 0–149)
VLDL Cholesterol Cal: 23 mg/dL (ref 5–40)

## 2019-12-06 LAB — HEPATIC FUNCTION PANEL
ALT: 14 IU/L (ref 0–32)
AST: 38 IU/L (ref 0–40)
Albumin: 4.5 g/dL (ref 3.8–4.8)
Alkaline Phosphatase: 69 IU/L (ref 44–121)
Bilirubin Total: 0.5 mg/dL (ref 0.0–1.2)
Bilirubin, Direct: 0.11 mg/dL (ref 0.00–0.40)
Total Protein: 7.3 g/dL (ref 6.0–8.5)

## 2019-12-06 LAB — CBC WITH DIFFERENTIAL/PLATELET
Basophils Absolute: 0.1 10*3/uL (ref 0.0–0.2)
Basos: 1 %
EOS (ABSOLUTE): 0.6 10*3/uL — ABNORMAL HIGH (ref 0.0–0.4)
Eos: 8 %
Hematocrit: 34.2 % (ref 34.0–46.6)
Hemoglobin: 11.5 g/dL (ref 11.1–15.9)
Immature Grans (Abs): 0 10*3/uL (ref 0.0–0.1)
Immature Granulocytes: 0 %
Lymphocytes Absolute: 2.2 10*3/uL (ref 0.7–3.1)
Lymphs: 30 %
MCH: 30.7 pg (ref 26.6–33.0)
MCHC: 33.6 g/dL (ref 31.5–35.7)
MCV: 91 fL (ref 79–97)
Monocytes Absolute: 0.6 10*3/uL (ref 0.1–0.9)
Monocytes: 8 %
Neutrophils Absolute: 3.9 10*3/uL (ref 1.4–7.0)
Neutrophils: 53 %
Platelets: 186 10*3/uL (ref 150–450)
RBC: 3.74 x10E6/uL — ABNORMAL LOW (ref 3.77–5.28)
RDW: 13.7 % (ref 11.7–15.4)
WBC: 7.3 10*3/uL (ref 3.4–10.8)

## 2019-12-06 LAB — MICROALBUMIN / CREATININE URINE RATIO
Creatinine, Urine: 16.7 mg/dL
Microalb/Creat Ratio: <18 mg/g creat (ref 0–29)
Microalbumin, Urine: <3 ug/mL

## 2019-12-06 LAB — HEMOGLOBIN A1C
Est. average glucose Bld gHb Est-mCnc: 126 mg/dL
Hgb A1c MFr Bld: 6 % — ABNORMAL HIGH (ref 4.8–5.6)

## 2019-12-06 LAB — TSH: TSH: 0.617 u[IU]/mL (ref 0.450–4.500)

## 2019-12-10 ENCOUNTER — Other Ambulatory Visit: Payer: Self-pay | Admitting: Family Medicine

## 2019-12-12 ENCOUNTER — Telehealth: Payer: Self-pay | Admitting: Family Medicine

## 2019-12-12 NOTE — Telephone Encounter (Signed)
Sabrina Deleon

## 2019-12-13 ENCOUNTER — Other Ambulatory Visit: Payer: Self-pay | Admitting: Nurse Practitioner

## 2019-12-13 MED ORDER — ZOLPIDEM TARTRATE 5 MG PO TABS: ORAL_TABLET | ORAL | 2 refills | Status: DC

## 2019-12-13 NOTE — Telephone Encounter (Signed)
Done

## 2019-12-22 ENCOUNTER — Other Ambulatory Visit: Payer: Self-pay | Admitting: *Deleted

## 2019-12-22 NOTE — Patient Outreach (Signed)
[facility]) Care Management  12/22/2019  Sabrina Deleon 1951-01-22 [id number]   Telephone Screen  Referral Date:08/26/2019 Referral Source:EMMI Prevent Reason for Referral:EMMI Prevent Screening/Join Care Management Insurance:[employer] Medicare   Outreach Attempt:  Outreach attempt #2 to patient to reoffer [facility] services and telephone screening. No answer. RN Health Coach left voicemail message along with contact information.  Plan:  RN Health Coach will make another outreach attempt within the month of November if no return call back from patient.  RN Health Coach will send Unsuccessful letter.  [place] [phone] [email or web address]

## 2019-12-23 ENCOUNTER — Telehealth: Payer: Self-pay

## 2019-12-23 ENCOUNTER — Other Ambulatory Visit: Payer: Self-pay | Admitting: Family Medicine

## 2019-12-23 MED ORDER — HYDROCODONE-ACETAMINOPHEN 5-325 MG PO TABS: ORAL_TABLET | ORAL | 0 refills | Status: DC

## 2019-12-23 NOTE — Telephone Encounter (Signed)
Medication was sent in as requested please send patient a MyChart message needs to keep her follow-up visit thank you

## 2019-12-23 NOTE — Telephone Encounter (Signed)
Pt contacted and verbalized understanding.  

## 2019-12-23 NOTE — Telephone Encounter (Signed)
Please advise. Thank you

## 2019-12-23 NOTE — Telephone Encounter (Signed)
Patient has an appt on Friday with Hoyle Sauer but will need a refill on her pain medication before then.    [address]

## 2019-12-26 ENCOUNTER — Other Ambulatory Visit: Payer: Self-pay

## 2019-12-26 ENCOUNTER — Ambulatory Visit (INDEPENDENT_AMBULATORY_CARE_PROVIDER_SITE_OTHER): Payer: Medicare Other | Admitting: Nurse Practitioner

## 2019-12-26 ENCOUNTER — Encounter: Payer: Self-pay | Admitting: Nurse Practitioner

## 2019-12-26 VITALS — BP 138/72 | HR 85 | Temp 97.7°F | Wt 238.4 lb

## 2019-12-26 DIAGNOSIS — Z79891 Long term (current) use of opiate analgesic: Secondary | ICD-10-CM | POA: Diagnosis not present

## 2019-12-26 DIAGNOSIS — Z9049 Acquired absence of other specified parts of digestive tract: Secondary | ICD-10-CM

## 2019-12-26 DIAGNOSIS — E785 Hyperlipidemia, unspecified: Secondary | ICD-10-CM

## 2019-12-26 DIAGNOSIS — L989 Disorder of the skin and subcutaneous tissue, unspecified: Secondary | ICD-10-CM | POA: Diagnosis not present

## 2019-12-26 DIAGNOSIS — K9189 Other postprocedural complications and disorders of digestive system: Secondary | ICD-10-CM | POA: Insufficient documentation

## 2019-12-26 DIAGNOSIS — N1832 Chronic kidney disease, stage 3b: Secondary | ICD-10-CM

## 2019-12-26 DIAGNOSIS — R197 Diarrhea, unspecified: Secondary | ICD-10-CM

## 2019-12-26 DIAGNOSIS — E1122 Type 2 diabetes mellitus with diabetic chronic kidney disease: Secondary | ICD-10-CM | POA: Diagnosis not present

## 2019-12-26 DIAGNOSIS — Z79899 Other long term (current) drug therapy: Secondary | ICD-10-CM | POA: Diagnosis not present

## 2019-12-26 MED ORDER — TRIAMCINOLONE ACETONIDE 0.1 % EX CREA
1.0000 | TOPICAL_CREAM | Freq: Two times a day (BID) | CUTANEOUS | 0 refills | Status: DC
Start: 2019-12-26 — End: 2021-05-28

## 2019-12-26 MED ORDER — ROSUVASTATIN CALCIUM 10 MG PO TABS
10.0000 mg | ORAL_TABLET | Freq: Every day | ORAL | 2 refills | Status: DC
Start: 2019-12-26 — End: 2020-03-22

## 2019-12-26 MED ORDER — COLESTIPOL HCL 1 G PO TABS: 1.0000 g | ORAL_TABLET | Freq: Two times a day (BID) | ORAL | 2 refills | Status: DC

## 2019-12-26 NOTE — Patient Instructions (Addendum)
Try a couple cups of Activia yogurt daily or take an over the counter probiotic.  We will send in a prescription for steroid cream for your spot on the hand. Let us know if this does not help.

## 2019-12-26 NOTE — Progress Notes (Signed)
Subjective:    Patient ID: Sabrina Deleon, female    DOB: 1950-04-11, 69 y.o.   MRN: [id number]  HPI  This patient was seen today for chronic pain  The medication list was reviewed and updated.   -Compliance with medication: yes  - Number patient states they take daily: patient reports taking 1/2 tablet 2-3 x per day  -when was the last dose patient took? This AM  The patient was advised the importance of maintaining medication and not using illegal substances with these.  Here for refills and follow up  The patient was educated that we can provide 3 monthly scripts for their medication, it is their responsibility to follow the instructions.  Side effects or complications from medications: none  Patient is aware that pain medications are meant to minimize the severity of the pain to allow their pain levels to improve to allow for better function. They are aware of that pain medications cannot totally remove their pain.  Due for UDT ( at least once per year) : Done today  Scale of 1 to 10 ( 1 is least 10 is most) Your pain level without the medicine: 9 Your pain level with medication: 5  Scale 1 to 10 ( 1-helps very little, 10 helps very well) How well does your pain medication reduce your pain so you can function better through out the day? 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  Controlled substance contract completed.  Patient would like the top of her right hand checked. Spot has grown and is itching.   Patient would like to discuss IBS symptoms. Having frequent diarrhea lately up to 10 times a day which has made her hemorrhoid flare up. Unassociated with particular foods at this point. Everything seems to cause it. Did notice more diarrhea after her cholecystectomy years ago. Also has gone back to work and has had increased stress.    Review of Systems  Constitutional: Negative for activity change, chills, fatigue and fever.    Respiratory: Negative for cough, choking, chest tightness, shortness of breath and wheezing.   Cardiovascular: Negative for chest pain, palpitations and leg swelling.  Gastrointestinal: Positive for diarrhea. Negative for abdominal distention, abdominal pain, blood in stool, constipation, nausea and vomiting.       Increasingly worse.  Genitourinary: Negative for dysuria, flank pain, frequency, pelvic pain, urgency and vaginal bleeding.  Musculoskeletal: Positive for arthralgias. Negative for joint swelling and myalgias.       RUE from surgery.  Skin: Negative for wound.       Has a lesion on the back of the right hand. Has been there for months. Has slowly gotten larger. Non tender. Mildly pruritic at times. No other similar rash.   Neurological: Negative for dizziness, syncope, weakness, light-headedness, numbness and headaches.  Psychiatric/Behavioral: Negative for agitation, self-injury, sleep disturbance and suicidal ideas. The patient is not nervous/anxious.      Office Visit from 12/26/2019 in [facility]  PHQ-9 Total Score 7     GAD 7 : Generalized Anxiety Score 12/26/2019 04/27/2017  Nervous, Anxious, on Edge 2 2  Control/stop worrying 2 3  Worry too much - different things 3 3  Trouble relaxing 3 2  Restless 0 1  Easily annoyed or irritable 2 3  Afraid - awful might happen 0 1  Total GAD 7 Score 12 15  Anxiety Difficulty Somewhat difficult Somewhat difficult  Objective:   Physical Exam Constitutional:      Appearance: She is well-developed and well-groomed.  Cardiovascular:     Rate and Rhythm: Normal rate and regular rhythm.     Pulses: Normal pulses.     Comments: Gr III/VI harsh holosystolic murmur loudest at 2nd ICS-RSB but heard throughout precordium. Carotids no bruits or thrills.  Pulmonary:     Effort: Pulmonary effort is normal. No respiratory distress.     Breath sounds: Normal breath sounds. No wheezing or rhonchi.  Lymphadenopathy:      Cervical: No cervical adenopathy.  Skin:    General: Skin is warm and dry.     Findings: No bruising.     Comments: 2x2 cm spot on the anterior surface of her right hand. It is well defined, circular, pink, slightly raised. No excoriation noted.   Neurological:     Mental Status: She is alert.  Psychiatric:        Mood and Affect: Mood normal.        Behavior: Behavior normal.        Thought Content: Thought content normal.        Judgment: Judgment normal.     Today's Vitals   12/26/19 1403  BP: 138/72  Pulse: 85  Temp: 97.7 F (36.5 C)  SpO2: 97%  Weight: 108.1 kg   Body mass index is 36.25 kg/m.     Diabetic Foot Exam - Simple   Simple Foot Form Diabetic Foot exam was performed with the following findings: Yes 12/26/2019  4:45 PM  Visual Inspection No deformities, no ulcerations, no other skin breakdown bilaterally: Yes Sensation Testing Intact to touch and monofilament testing bilaterally: Yes Pulse Check Posterior Tibialis and Dorsalis pulse intact bilaterally: Yes Comments       Assessment & Plan:   Problem List Items Addressed This Visit      Digestive   Postcholecystectomy diarrhea     Endocrine   Type 2 diabetes mellitus (HCC)   Relevant Medications   rosuvastatin (CRESTOR) 10 MG tablet     Other   Hyperlipidemia   Relevant Medications   rosuvastatin (CRESTOR) 10 MG tablet   colestipol (COLESTID) 1 g tablet   Other Relevant Orders   Lipid panel   Encounter for long-term opiate analgesic use - Primary    Other Visit Diagnoses    High risk medication use       Relevant Orders   ToxASSURE Select 13 (MW), Urine   Hepatic function panel   Skin lesion of hand         Meds ordered this encounter  Medications  . triamcinolone cream (KENALOG) 0.1 %    Sig: Apply 1 application topically 2 (two) times daily. Prn rash; use up to 2 weeks    Dispense:  30 g    Refill:  0    Order Specific Question:   Supervising Provider    Answer:   Sallee Lange A [[id number]]  . rosuvastatin (CRESTOR) 10 MG tablet    Sig: Take 1 tablet (10 mg total) by mouth daily. For cholesterol; Stop while taking Colchicine for gout    Dispense:  30 tablet    Refill:  2    Order Specific Question:   Supervising Provider    Answer:   [place], [place]  . colestipol (COLESTID) 1 g tablet    Sig: Take 1 tablet (1 g total) by mouth 2 (two) times daily. For diarrhea.    Dispense:  60 tablet    Refill:  2    Order Specific Question:   Supervising Provider    Answer:   Sallee Lange A [[id number]]  Encouraged healthy diet low in sugar and simple carbs and regular walking program. Recommend daily probiotic such as activia yogurt or align.  There is a question whether this is postcholecystectomy diarrhea or if this is IBS-D.  Trial of Colestid as directed low-dose with plans to titrate if this helps her diarrhea. Triamcinolone cream twice daily to rash on her hand for couple weeks.  Call back at that time if no improvement. Switch to rosuvastatin for her cholesterol.  Stop pravastatin. Follow up lipid panel and LFT to be completed in 2 months.  Return in about 3 months (around 03/27/2020) for Pain Management.

## 2019-12-27 ENCOUNTER — Other Ambulatory Visit (INDEPENDENT_AMBULATORY_CARE_PROVIDER_SITE_OTHER): Payer: Medicare Other

## 2019-12-27 DIAGNOSIS — Z23 Encounter for immunization: Secondary | ICD-10-CM | POA: Diagnosis not present

## 2019-12-28 ENCOUNTER — Encounter: Payer: Self-pay | Admitting: Nurse Practitioner

## 2019-12-28 NOTE — Progress Notes (Signed)
   Subjective:    Patient ID: Sabrina Deleon, female    DOB: 04-26-1950, 69 y.o.   MRN: [id number]  HPI    Review of Systems     Objective:   Physical Exam        Assessment & Plan:

## 2019-12-29 NOTE — Progress Notes (Signed)
Patient notified and verbalized understanding. 

## 2020-01-01 LAB — TOXASSURE SELECT 13 (MW), URINE

## 2020-01-19 ENCOUNTER — Other Ambulatory Visit: Payer: Self-pay | Admitting: *Deleted

## 2020-01-19 NOTE — Patient Outreach (Signed)
[facility]) Care Management  01/19/2020  Sabrina Deleon 01/04/1951 [id number]   Telephone Screen/Case Closure  Referral Date:08/26/2019 Referral Source:EMMI Prevent Reason for Referral:EMMI Prevent Screening/Join Care Management Insurance:[employer]   Outreach Attempt:  Unsuccessful telephone outreach to patient for reoffer of [facility] services.  No answer.  Multiple attempts to reestablish contact with patient without success. No response from letter mailed to patient. Case is being closed at this time.   Plan:  RN Health Coach will close case based on inability to reestablish contact with patient and patient's previous declining of services.    [place] [phone] [email or web address]

## 2020-01-26 ENCOUNTER — Other Ambulatory Visit: Payer: Self-pay | Admitting: Family Medicine

## 2020-01-26 ENCOUNTER — Telehealth: Payer: Self-pay

## 2020-01-26 MED ORDER — SUCRALFATE 1 G PO TABS
ORAL_TABLET | ORAL | 0 refills | Status: DC
Start: 2020-01-26 — End: 2020-05-21

## 2020-01-26 NOTE — Telephone Encounter (Signed)
Patient ate something greasy Saturday and it did not agree with her. Has started eating bland diet and taking Protonix every other day.

## 2020-01-26 NOTE — Telephone Encounter (Signed)
Patient is having a flare up of gastritis and would like a refill on Carafate sent to [facility].

## 2020-01-26 NOTE — Telephone Encounter (Signed)
Prescription was sent in follow-up if ongoing troubles

## 2020-01-28 ENCOUNTER — Telehealth: Payer: Self-pay

## 2020-01-28 ENCOUNTER — Other Ambulatory Visit: Payer: Self-pay | Admitting: *Deleted

## 2020-01-28 MED ORDER — KETOCONAZOLE 2 % EX CREA: TOPICAL_CREAM | CUTANEOUS | 3 refills | Status: DC

## 2020-01-28 NOTE — Telephone Encounter (Signed)
Wants it for yeast under belly

## 2020-01-28 NOTE — Telephone Encounter (Signed)
Refills sent and pt was notified.  

## 2020-01-28 NOTE — Telephone Encounter (Signed)
Patient is requesting refill on ketoconazole cream called into [facility]

## 2020-01-28 NOTE — Telephone Encounter (Signed)
Ketoconazole cream, 60 g, apply twice daily as needed, 3 refills

## 2020-02-02 ENCOUNTER — Telehealth: Payer: Self-pay | Admitting: Family Medicine

## 2020-02-02 ENCOUNTER — Other Ambulatory Visit: Payer: Self-pay | Admitting: Family Medicine

## 2020-02-02 MED ORDER — HYDROCODONE-ACETAMINOPHEN 5-325 MG PO TABS
ORAL_TABLET | ORAL | 0 refills | Status: DC
Start: 2020-02-02 — End: 2020-03-16

## 2020-02-02 NOTE — Telephone Encounter (Signed)
Prescription sent in as requested 

## 2020-02-02 NOTE — Telephone Encounter (Signed)
Patient needing refill on Hydrocodone 5-325 mg. Pt last seen 12/26/19 for pain management. Please advise. Thank you.  [facility] [facility]

## 2020-02-02 NOTE — Telephone Encounter (Signed)
Pt.notified

## 2020-02-03 LAB — HM DIABETES EYE EXAM

## 2020-03-02 ENCOUNTER — Other Ambulatory Visit: Payer: Self-pay | Admitting: Family Medicine

## 2020-03-13 ENCOUNTER — Other Ambulatory Visit: Payer: Self-pay | Admitting: Family Medicine

## 2020-03-16 ENCOUNTER — Telehealth: Payer: Self-pay | Admitting: Family Medicine

## 2020-03-16 ENCOUNTER — Other Ambulatory Visit: Payer: Self-pay | Admitting: Nurse Practitioner

## 2020-03-16 MED ORDER — HYDROCODONE-ACETAMINOPHEN 5-325 MG PO TABS
ORAL_TABLET | ORAL | 0 refills | Status: DC
Start: 2020-03-16 — End: 2020-05-03

## 2020-03-16 NOTE — Telephone Encounter (Signed)
Medication sent in  Thanks

## 2020-03-16 NOTE — Telephone Encounter (Signed)
Pt states pharmacy does not have pain scripts for Hydrocodone 5-325 mg. Last script was in November. Pt seen in October for pain management. Please advise. Thank you  [facility] [address]

## 2020-03-16 NOTE — Telephone Encounter (Signed)
Pt is aware.  

## 2020-03-22 ENCOUNTER — Other Ambulatory Visit: Payer: Self-pay | Admitting: Nurse Practitioner

## 2020-04-02 ENCOUNTER — Other Ambulatory Visit: Payer: Self-pay | Admitting: Family Medicine

## 2020-04-06 ENCOUNTER — Other Ambulatory Visit: Payer: Self-pay | Admitting: *Deleted

## 2020-04-06 ENCOUNTER — Telehealth: Payer: Self-pay | Admitting: Family Medicine

## 2020-04-06 MED ORDER — LEVOTHYROXINE SODIUM 137 MCG PO TABS: ORAL_TABLET | ORAL | 1 refills | Status: DC

## 2020-04-06 NOTE — Telephone Encounter (Signed)
Pt wanted refill sent to [facility] [address]. Refills sent per protocol and pt was notified.

## 2020-04-06 NOTE — Telephone Encounter (Signed)
patient is requesting refill on levothyroxine 137 mcg called into [facility]

## 2020-04-07 ENCOUNTER — Other Ambulatory Visit: Payer: Self-pay | Admitting: Family Medicine

## 2020-04-08 ENCOUNTER — Other Ambulatory Visit: Payer: Self-pay | Admitting: Nurse Practitioner

## 2020-04-08 ENCOUNTER — Other Ambulatory Visit: Payer: Self-pay | Admitting: Family Medicine

## 2020-04-09 NOTE — Telephone Encounter (Signed)
Seen 12/26/19 for chronic pain

## 2020-05-03 ENCOUNTER — Other Ambulatory Visit: Payer: Self-pay | Admitting: *Deleted

## 2020-05-03 MED ORDER — HYDROCODONE-ACETAMINOPHEN 5-325 MG PO TABS: ORAL_TABLET | ORAL | 0 refills | Status: DC

## 2020-05-03 NOTE — Telephone Encounter (Signed)
1.  May pend medication 2.  She needs to schedule a follow-up visit this is a controlled medicine I have rules I must follow

## 2020-05-03 NOTE — Telephone Encounter (Signed)
Last pain management 12/26/19. Pt requesting refill on hydrocodone [facility] [address]

## 2020-05-06 ENCOUNTER — Other Ambulatory Visit: Payer: Self-pay | Admitting: Family Medicine

## 2020-05-06 DIAGNOSIS — E785 Hyperlipidemia, unspecified: Secondary | ICD-10-CM

## 2020-05-06 DIAGNOSIS — N1832 Chronic kidney disease, stage 3b: Secondary | ICD-10-CM

## 2020-05-06 DIAGNOSIS — E039 Hypothyroidism, unspecified: Secondary | ICD-10-CM

## 2020-05-06 DIAGNOSIS — Z79899 Other long term (current) drug therapy: Secondary | ICD-10-CM

## 2020-05-06 DIAGNOSIS — E1122 Type 2 diabetes mellitus with diabetic chronic kidney disease: Secondary | ICD-10-CM

## 2020-05-07 NOTE — Telephone Encounter (Signed)
May have 90-day, needs lipid, met 7, A1c, TSH Diabetes hyperlipidemia hypothyroidism Needs office visit this spring

## 2020-05-09 ENCOUNTER — Other Ambulatory Visit: Payer: Self-pay | Admitting: Family Medicine

## 2020-05-21 ENCOUNTER — Encounter: Payer: Self-pay | Admitting: Nurse Practitioner

## 2020-05-21 ENCOUNTER — Ambulatory Visit (INDEPENDENT_AMBULATORY_CARE_PROVIDER_SITE_OTHER): Payer: Medicare Other | Admitting: Nurse Practitioner

## 2020-05-21 ENCOUNTER — Other Ambulatory Visit: Payer: Self-pay

## 2020-05-21 VITALS — BP 144/70 | HR 70 | Temp 97.1°F | Ht 68.0 in | Wt 224.0 lb

## 2020-05-21 DIAGNOSIS — M25551 Pain in right hip: Secondary | ICD-10-CM

## 2020-05-21 DIAGNOSIS — E039 Hypothyroidism, unspecified: Secondary | ICD-10-CM | POA: Diagnosis not present

## 2020-05-21 DIAGNOSIS — E1122 Type 2 diabetes mellitus with diabetic chronic kidney disease: Secondary | ICD-10-CM | POA: Diagnosis not present

## 2020-05-21 DIAGNOSIS — N1832 Chronic kidney disease, stage 3b: Secondary | ICD-10-CM | POA: Diagnosis not present

## 2020-05-21 DIAGNOSIS — Z79891 Long term (current) use of opiate analgesic: Secondary | ICD-10-CM

## 2020-05-21 DIAGNOSIS — G8929 Other chronic pain: Secondary | ICD-10-CM

## 2020-05-21 DIAGNOSIS — E785 Hyperlipidemia, unspecified: Secondary | ICD-10-CM | POA: Diagnosis not present

## 2020-05-21 DIAGNOSIS — Z79899 Other long term (current) drug therapy: Secondary | ICD-10-CM | POA: Diagnosis not present

## 2020-05-21 MED ORDER — HYDROCODONE-ACETAMINOPHEN 5-325 MG PO TABS: ORAL_TABLET | ORAL | 0 refills | Status: DC

## 2020-05-21 MED ORDER — HYDROCODONE-ACETAMINOPHEN 5-325 MG PO TABS
ORAL_TABLET | ORAL | 0 refills | Status: DC
Start: 2020-05-21 — End: 2020-05-21

## 2020-05-21 NOTE — Patient Instructions (Signed)
Citalopram: Decrease to one tab each day for a least a month;  If you want to, decrease to 1/2 tab for at least a month then stop

## 2020-05-21 NOTE — Progress Notes (Signed)
Subjective:    Patient ID: Sabrina Deleon, female    DOB: 1951/01/26, 70 y.o.   MRN: [id number]  HPI This patient was seen today for chronic pain  The medication list was reviewed and updated.   -Compliance with medication: takes one half bid  - Number patient states they take daily: one half bid  -when was the last dose patient took? yesterday  The patient was advised the importance of maintaining medication and not using illegal substances with these.  Here for refills and follow up  The patient was educated that we can provide 3 monthly scripts for their medication, it is their responsibility to follow the instructions.  Side effects or complications from medications: none  Patient is aware that pain medications are meant to minimize the severity of the pain to allow their pain levels to improve to allow for better function. They are aware of that pain medications cannot totally remove their pain.  Due for UDT ( at least once per year) : 01/05/20  Patient retired recently, states her stress level is tremendously better.  Would like to wean off her Celexa at this point. Current pain medication regimen allows her to keep functioning and doing her ADLs.  Tries to use minimal amount as needed. Continues to have limited range of motion with the right shoulder due to severe traumatic injury.  Can use the arm below shoulder height but cannot raise her arm above her head.  States when she tries to do this her arm becomes tired with pain.  Has multiple orthopedic issues including arthritis and left hip arthroplasty.    Review of Systems     Objective:   Physical Exam NAD.  Alert, oriented.  Cheerful calm affect.  Lungs clear.  Heart regular rate rhythm.  Gait slow but steady.  Is able to get onto the exam table.  Active rotation of her left shoulder just to shoulder height. Today's Vitals   05/21/20 1410  BP: (!) 144/70  Pulse: 70  Temp: (!) 97.1 F (36.2 C)  SpO2: 99%   Weight: 224 lb (101.6 kg)  Height: 5\' 8"  (1.727 m)   Body mass index is 34.06 kg/m. Has lost 14 pounds since October.     Assessment & Plan:   Problem List Items Addressed This Visit      Other   Chronic right hip pain   Relevant Medications   HYDROcodone-acetaminophen (NORCO/VICODIN) 5-325 MG tablet   Encounter for long-term opiate analgesic use - Primary     Meds ordered this encounter  Medications  . DISCONTD: HYDROcodone-acetaminophen (NORCO/VICODIN) 5-325 MG tablet    Sig: Take one tab po TID prn pain    Dispense:  45 tablet    Refill:  0    May fill 05/31/20    Order Specific Question:   Supervising Provider    Answer:   Sallee Lange A [[id number]]  . DISCONTD: HYDROcodone-acetaminophen (NORCO/VICODIN) 5-325 MG tablet    Sig: Take one tab po TID prn pain    Dispense:  45 tablet    Refill:  0    May fill 30 days from 05/31/20    Order Specific Question:   Supervising Provider    Answer:   Sallee Lange A [[id number]]  . HYDROcodone-acetaminophen (NORCO/VICODIN) 5-325 MG tablet    Sig: Take one tab po TID prn pain    Dispense:  45 tablet    Refill:  0    May fill 60 days from 05/31/20  Order Specific Question:   Supervising Provider    Answer:   Sallee Lange A [[id number]]   Continue current medication regimen as directed. Decrease citalopram from 1-1/2 tabs per day to one tab per day for at least a month.  Then if tolerated and patient wants to continue weaning, decrease to half a tab for at least 1 month then stop medication.  Patient understands she may still have some issues with coming off the medicine but with weaning it should be minimal. Return in about 3 months (around 08/21/2020) for Pain management.

## 2020-05-22 ENCOUNTER — Encounter: Payer: Self-pay | Admitting: Nurse Practitioner

## 2020-05-22 LAB — BASIC METABOLIC PANEL
BUN/Creatinine Ratio: 15 (ref 12–28)
BUN: 21 mg/dL (ref 8–27)
CO2: 23 mmol/L (ref 20–29)
Calcium: 10.1 mg/dL (ref 8.7–10.3)
Chloride: 104 mmol/L (ref 96–106)
Creatinine, Ser: 1.4 mg/dL — ABNORMAL HIGH (ref 0.57–1.00)
Glucose: 89 mg/dL (ref 65–99)
Potassium: 4.3 mmol/L (ref 3.5–5.2)
Sodium: 139 mmol/L (ref 134–144)
eGFR: 41 mL/min/{1.73_m2} — ABNORMAL LOW (ref >59)

## 2020-05-22 LAB — TSH: TSH: 1.12 u[IU]/mL (ref 0.450–4.500)

## 2020-05-22 LAB — HEMOGLOBIN A1C
Est. average glucose Bld gHb Est-mCnc: 117 mg/dL
Hgb A1c MFr Bld: 5.7 % — ABNORMAL HIGH (ref 4.8–5.6)

## 2020-05-22 LAB — LIPID PANEL
Chol/HDL Ratio: 3.2 ratio (ref 0.0–4.4)
Cholesterol, Total: 120 mg/dL (ref 100–199)
HDL: 38 mg/dL — ABNORMAL LOW (ref >39)
LDL Chol Calc (NIH): 59 mg/dL (ref 0–99)
Triglycerides: 127 mg/dL (ref 0–149)
VLDL Cholesterol Cal: 23 mg/dL (ref 5–40)

## 2020-05-24 ENCOUNTER — Other Ambulatory Visit: Payer: Self-pay | Admitting: Family Medicine

## 2020-05-25 ENCOUNTER — Encounter: Payer: Self-pay | Admitting: Nurse Practitioner

## 2020-05-27 ENCOUNTER — Other Ambulatory Visit: Payer: Self-pay | Admitting: Family Medicine

## 2020-06-17 ENCOUNTER — Telehealth: Payer: Medicare Other | Admitting: Physician Assistant

## 2020-06-17 DIAGNOSIS — B9689 Other specified bacterial agents as the cause of diseases classified elsewhere: Secondary | ICD-10-CM

## 2020-06-17 DIAGNOSIS — J019 Acute sinusitis, unspecified: Secondary | ICD-10-CM

## 2020-06-17 MED ORDER — DOXYCYCLINE HYCLATE 100 MG PO CAPS: 100.0000 mg | ORAL_CAPSULE | Freq: Two times a day (BID) | ORAL | 0 refills | Status: DC

## 2020-06-17 NOTE — Progress Notes (Signed)
I have spent 5 minutes in review of e-visit questionnaire, review and updating patient chart, medical decision making and response to patient.   Cregg Jutte Cody Kanav Kazmierczak, PA-C    

## 2020-06-17 NOTE — Progress Notes (Signed)

## 2020-06-19 ENCOUNTER — Other Ambulatory Visit: Payer: Self-pay | Admitting: Family Medicine

## 2020-07-08 ENCOUNTER — Other Ambulatory Visit: Payer: Self-pay | Admitting: Nurse Practitioner

## 2020-07-31 ENCOUNTER — Other Ambulatory Visit: Payer: Self-pay | Admitting: Family Medicine

## 2020-08-20 ENCOUNTER — Encounter: Payer: Self-pay | Admitting: Nurse Practitioner

## 2020-08-20 ENCOUNTER — Other Ambulatory Visit: Payer: Self-pay

## 2020-08-20 ENCOUNTER — Ambulatory Visit (INDEPENDENT_AMBULATORY_CARE_PROVIDER_SITE_OTHER): Payer: Medicare Other | Admitting: Nurse Practitioner

## 2020-08-20 VITALS — BP 142/78 | HR 64 | Temp 97.9°F | Wt 211.0 lb

## 2020-08-20 DIAGNOSIS — H66002 Acute suppurative otitis media without spontaneous rupture of ear drum, left ear: Secondary | ICD-10-CM | POA: Diagnosis not present

## 2020-08-20 DIAGNOSIS — M25551 Pain in right hip: Secondary | ICD-10-CM | POA: Diagnosis not present

## 2020-08-20 DIAGNOSIS — Z79891 Long term (current) use of opiate analgesic: Secondary | ICD-10-CM | POA: Diagnosis not present

## 2020-08-20 DIAGNOSIS — G8929 Other chronic pain: Secondary | ICD-10-CM | POA: Diagnosis not present

## 2020-08-20 MED ORDER — HYDROCODONE-ACETAMINOPHEN 5-325 MG PO TABS: ORAL_TABLET | ORAL | 0 refills | Status: DC

## 2020-08-20 NOTE — Progress Notes (Signed)
Subjective:    Patient ID: Sabrina Deleon, female    DOB: [birth year], 70 y.o.   MRN: [id number]  HPI This patient was seen today for chronic pain.  States she has daily pain worse some days than others.  Tries to use minimal amount of opiates to control her pain.  Has had back surgery right hip surgery in August 2020 major surgery to her right shoulder.  The medication list was reviewed and updated.  Location of Pain for which the patient has been treated with regarding narcotics:   Onset of this pain:    -Compliance with medication: Hydrocodone 5-325 mg  - Number patient states they take daily: 1/2 in the morning 1/2 at night; sometimes 1 1/2.  -when was the last dose patient took? This morning   The patient was advised the importance of maintaining medication and not using illegal substances with these.  Here for refills and follow up  The patient was educated that we can provide 3 monthly scripts for their medication, it is their responsibility to follow the instructions.  Side effects or complications from medications:   Patient is aware that pain medications are meant to minimize the severity of the pain to allow their pain levels to improve to allow for better function. They are aware of that pain medications cannot totally remove their pain.  Due for UDT ( at least once per year) : done 12/26/19  Scale of 1 to 10 ( 1 is least 10 is most) Your pain level without the medicine: depends on activity 8 Your pain level with medication: 5  Scale 1 to 10 ( 1-helps very little, 10 helps very well) How well does your pain medication reduce your pain so you can function better through out the day? 8-depending     Left ear pain (either jaw, tooth, or ear). Having pain from head to neck.  Going on about 2 weeks No fever.  Mild head congestion.  Minimal cough mainly postnasal drainage.  No sore throat.  Also having tight tender muscles along the left side of the neck.  States she  has this frequently.  Also has 2 teeth on the lower left side that need attention.      Objective:   Physical Exam NAD.  Alert, oriented.  Calm cheerful affect.  TMs retracted bilateral, moderate erythema noted on the left.  Note that patient does wear hearing aids.  Ear canals are both clear no drainage or erythema.  Nares clear.  Pharynx injected with clear PND noted.  Neck supple with mild soft anterior adenopathy.  Very tight tender muscles noted along the lateral neck area into the left TMJ.  Can open and close her mouth without difficulty.  Lungs clear.  Heart regular rate rhythm. Today's Vitals   08/20/20 1348  BP: (!) 142/78  Pulse: 64  Temp: 97.9 F (36.6 C)  SpO2: 99%  Weight: 211 lb (95.7 kg)   Body mass index is 32.08 kg/m.        Assessment & Plan:   Problem List Items Addressed This Visit      Other   Chronic right hip pain   Relevant Medications   HYDROcodone-acetaminophen (NORCO/VICODIN) 5-325 MG tablet   Encounter for long-term opiate analgesic use - Primary    Other Visit Diagnoses    Non-recurrent acute suppurative otitis media of left ear without spontaneous rupture of tympanic membrane       Relevant Medications   amoxicillin-clavulanate (AUGMENTIN) 875-125 MG  tablet     Meds ordered this encounter  Medications  . DISCONTD: HYDROcodone-acetaminophen (NORCO/VICODIN) 5-325 MG tablet    Sig: Take one tab po TID prn pain    Dispense:  45 tablet    Refill:  0    May fill 60 days from 08/20/20    Order Specific Question:   Supervising Provider    Answer:   Sallee Lange A [[id number]]  . DISCONTD: HYDROcodone-acetaminophen (NORCO/VICODIN) 5-325 MG tablet    Sig: Take one tab po TID prn pain    Dispense:  45 tablet    Refill:  0    May fill 30 days from 08/20/20    Order Specific Question:   Supervising Provider    Answer:   Sallee Lange A [[id number]]  . HYDROcodone-acetaminophen (NORCO/VICODIN) 5-325 MG tablet    Sig: Take one tab po TID prn pain    Dispense:   45 tablet    Refill:  0    Order Specific Question:   Supervising Provider    Answer:   Sallee Lange A [[id number]]  . amoxicillin-clavulanate (AUGMENTIN) 875-125 MG tablet    Sig: Take 1 tablet by mouth 2 (two) times daily.    Dispense:  20 tablet    Refill:  0    Order Specific Question:   Supervising Provider    Answer:   Sallee Lange A [[id number]]   OTC meds as directed for congestion.  Call back if left ear pain persists. Based on the frequency and amount of pain that she is having, recommend that she consider referral to pain management to discuss options.  Patient will think about this and let us know if she needs a referral. Return in about 3 months (around 11/20/2020).

## 2020-08-21 ENCOUNTER — Encounter: Payer: Self-pay | Admitting: Nurse Practitioner

## 2020-08-21 MED ORDER — AMOXICILLIN-POT CLAVULANATE 875-125 MG PO TABS: 1.0000 | ORAL_TABLET | Freq: Two times a day (BID) | ORAL | 0 refills | Status: DC

## 2020-08-29 ENCOUNTER — Other Ambulatory Visit: Payer: Self-pay | Admitting: Family Medicine

## 2020-09-13 ENCOUNTER — Other Ambulatory Visit: Payer: Self-pay | Admitting: Family Medicine

## 2020-09-17 ENCOUNTER — Other Ambulatory Visit: Payer: Self-pay | Admitting: Family Medicine

## 2020-09-17 ENCOUNTER — Other Ambulatory Visit: Payer: Self-pay | Admitting: *Deleted

## 2020-09-17 MED ORDER — PANTOPRAZOLE SODIUM 40 MG PO TBEC
DELAYED_RELEASE_TABLET | ORAL | 1 refills | Status: DC
Start: 2020-09-17 — End: 2021-03-08

## 2020-10-09 ENCOUNTER — Other Ambulatory Visit: Payer: Self-pay | Admitting: Nurse Practitioner

## 2020-11-19 ENCOUNTER — Ambulatory Visit (INDEPENDENT_AMBULATORY_CARE_PROVIDER_SITE_OTHER): Payer: Medicare Other | Admitting: Nurse Practitioner

## 2020-11-19 ENCOUNTER — Other Ambulatory Visit: Payer: Self-pay

## 2020-11-19 VITALS — BP 120/60 | HR 59 | Temp 97.3°F | Ht 68.0 in | Wt 199.0 lb

## 2020-11-19 DIAGNOSIS — E039 Hypothyroidism, unspecified: Secondary | ICD-10-CM

## 2020-11-19 DIAGNOSIS — Z0001 Encounter for general adult medical examination with abnormal findings: Secondary | ICD-10-CM

## 2020-11-19 DIAGNOSIS — Z0000 Encounter for general adult medical examination without abnormal findings: Secondary | ICD-10-CM

## 2020-11-19 DIAGNOSIS — N1832 Chronic kidney disease, stage 3b: Secondary | ICD-10-CM

## 2020-11-19 DIAGNOSIS — I10 Essential (primary) hypertension: Secondary | ICD-10-CM

## 2020-11-19 DIAGNOSIS — I350 Nonrheumatic aortic (valve) stenosis: Secondary | ICD-10-CM

## 2020-11-19 DIAGNOSIS — D509 Iron deficiency anemia, unspecified: Secondary | ICD-10-CM | POA: Diagnosis not present

## 2020-11-19 DIAGNOSIS — E1122 Type 2 diabetes mellitus with diabetic chronic kidney disease: Secondary | ICD-10-CM | POA: Diagnosis not present

## 2020-11-19 NOTE — Progress Notes (Signed)
   Subjective:    Patient ID: Sabrina Deleon, female    DOB: [birth year], 70 y.o.   MRN: [id number]  HPI AWV- Annual Wellness Visit  The patient was seen for their annual wellness visit. The patient's past medical history, surgical history, and family history were reviewed. Pertinent vaccines were reviewed ( tetanus, pneumonia, shingles, flu) The patient's medication list was reviewed and updated.  The height and weight were entered.  BMI recorded in electronic record elsewhere  Cognitive screening was completed. Outcome of Mini - Cog: 5   Falls /depression screening electronically recorded within record elsewhere  Current tobacco usage:no (All patients who use tobacco were given written and verbal information on quitting)  Recent listing of emergency department/hospitalizations over the past year were reviewed.  current specialist the patient sees on a regular basis: no   Medicare annual wellness visit patient questionnaire was reviewed.  A written screening schedule for the patient for the next 5-10 years was given. Appropriate discussion of followup regarding next visit was discussed.      Review of Systems     Objective:   Physical Exam        Assessment & Plan:

## 2020-11-19 NOTE — Progress Notes (Signed)
Subjective:    Patient ID: Sabrina Deleon, female    DOB: [birth year], 70 y.o.   MRN: [id number]  HPI AWV- Annual Wellness Visit  The patient was seen for their annual wellness visit. The patient's past medical history, surgical history, and family history were reviewed. Pertinent vaccines were reviewed ( tetanus, pneumonia, shingles, flu) The patient's medication list was reviewed and updated.  The height and weight were entered.  BMI recorded in electronic record elsewhere  Cognitive screening was completed. Outcome of Mini - Cog: 5   Falls /depression screening electronically recorded within record elsewhere  Current tobacco usage:no (All patients who use tobacco were given written and verbal information on quitting)  Recent listing of emergency department/hospitalizations over the past year were reviewed.  current specialist the patient sees on a regular basis: no   Medicare annual wellness visit patient questionnaire was reviewed.  A written screening schedule for the patient for the next 5-10 years was given. Appropriate discussion of followup regarding next visit was discuss   Patient presents for physical and three months follow-up. She is scheduled for mammogram sep. 29th 2022. Mother had breast cancer twice. She has had a colonoscopy she says within the last 10 years but can not recall date and states nothing was abnormal. She denies any accidents within the last 6 months but does state that she fell getting up from recliner during the night to go to bathroom. She said she got up too quickly became dizzy and fell. She feels her health is good and diet going well however, her appetite has decreased. She does tire more quickly than she use to with house chores and yard work. She denies smoking or drinking alcohol. She denies any headaches, blurred vision, dizziness since the fall from recliner. She denies numbness or tingling in extremities only sometimes slight numbness in  fingers of affected surgical right arm. She denies any difficulty with swallowing. Eye and dental exam UTD. She denies any constipation or diarrhea and says she goes to the bathroom everyday without any issues. She denies being sexually active and has no issues she would like [place] to address with a GYN exam.  She has no major concerns only a callous on the third right toe. She doesn't currently want podiatry referral but if changes mind she will let us know.    Review of Systems  Constitutional:  Positive for appetite change and fatigue. Negative for activity change, chills and fever.  HENT:  Negative for dental problem and trouble swallowing.   Respiratory:  Negative for cough, chest tightness, shortness of breath and wheezing.   Cardiovascular:  Negative for chest pain.  Gastrointestinal:  Negative for abdominal distention, abdominal pain, blood in stool, constipation, nausea and vomiting.  Genitourinary:  Negative for difficulty urinating, dysuria, enuresis, frequency, genital sores, pelvic pain, urgency and vaginal bleeding.  Neurological:  Negative for dizziness, tremors, weakness, light-headedness, numbness and headaches.  Depression screen [facility] 2/9 11/19/2020  Decreased Interest 0  Down, Depressed, Hopeless 0  PHQ - 2 Score 0  Altered sleeping -  Tired, decreased energy -  Change in appetite -  Feeling bad or failure about yourself  -  Trouble concentrating -  Moving slowly or fidgety/restless -  Suicidal thoughts -  PHQ-9 Score -  Difficult doing work/chores -  Some recent data might be hidden       Objective:   Physical Exam Vitals and nursing note reviewed. Exam conducted with a chaperone present.  Constitutional:  General: She is not in acute distress.    Appearance: Normal appearance.  Neck:     Comments: Thyroid soft and palpable. No tenderness, lumps or nodules felt on exam. Cardiovascular:     Rate and Rhythm: Regular rhythm. Bradycardia present.     Pulses:           Dorsalis pedis pulses are 1+ on the right side and 1+ on the left side.       Posterior tibial pulses are 1+ on the right side and 1+ on the left side.     Heart sounds: Murmur heard.     Comments: Grade III/VI holosystolic blowing murmur heard through precordium and radiates into both carotids. Pulmonary:     Effort: Pulmonary effort is normal.     Breath sounds: Normal breath sounds.  Chest:  Breasts:    Right: No swelling, bleeding, mass, nipple discharge, skin change or tenderness.     Left: No swelling, bleeding, mass, nipple discharge, skin change or tenderness.  Abdominal:     General: There is no distension.     Palpations: Abdomen is soft. There is no mass.     Tenderness: There is no abdominal tenderness. There is no guarding or rebound.  Genitourinary:    Comments: Defers GU exam. Denies any rash, lesions or discharge.  Musculoskeletal:     Cervical back: Neck supple.     Right lower leg: No edema.     Left lower leg: No edema.  Lymphadenopathy:     Cervical: No cervical adenopathy.     Upper Body:     Right upper body: No supraclavicular, axillary or pectoral adenopathy.     Left upper body: No supraclavicular, axillary or pectoral adenopathy.  Neurological:     Mental Status: She is alert and oriented to person, place, and time.  Psychiatric:        Mood and Affect: Mood normal.        Behavior: Behavior normal.        Thought Content: Thought content normal.        Judgment: Judgment normal.   See orthostatic BP and pulse.  Vitals:   11/19/20 0828  BP: 120/60  Pulse: (!) 59  Temp: (!) 97.3 F (36.3 C)  Height: 5' 8"  (1.727 m)  Weight: 199 lb (90.3 kg)  SpO2: 99%  BMI (Calculated): 30.26  Last labs: Results for orders placed or performed in visit on 05/06/20  Lipid Profile  Result Value Ref Range   Cholesterol, Total 120 100 - 199 mg/dL   Triglycerides 127 0 - 149 mg/dL   HDL 38 (L) >39 mg/dL   VLDL Cholesterol Cal 23 5 - 40 mg/dL   LDL Chol Calc  (NIH) 59 0 - 99 mg/dL   Chol/HDL Ratio 3.2 0.0 - 4.4 ratio  Basic Metabolic Panel (BMET)  Result Value Ref Range   Glucose 89 65 - 99 mg/dL   BUN 21 8 - 27 mg/dL   Creatinine, Ser 1.40 (H) 0.57 - 1.00 mg/dL   eGFR 41 (L) >59 mL/min/1.73   BUN/Creatinine Ratio 15 12 - 28   Sodium 139 134 - 144 mmol/L   Potassium 4.3 3.5 - 5.2 mmol/L   Chloride 104 96 - 106 mmol/L   CO2 23 20 - 29 mmol/L   Calcium 10.1 8.7 - 10.3 mg/dL  Hemoglobin A1c  Result Value Ref Range   Hgb A1c MFr Bld 5.7 (H) 4.8 - 5.6 %   Est. average glucose Bld  gHb Est-mCnc 117 mg/dL  TSH  Result Value Ref Range   TSH 1.120 0.450 - 4.500 uIU/mL   Diabetic Foot Exam - Simple   Simple Foot Form  11/19/2020  1:22 PM  Visual Inspection No deformities, no ulcerations, no other skin breakdown bilaterally: Yes See comments: Yes Sensation Testing Intact to touch and monofilament testing bilaterally: Yes Pulse Check Posterior Tibialis and Dorsalis pulse intact bilaterally: Yes Comments She had a callous on tip of third toe on right foot. Thick and brittle nails bilaterally. Bunions bilaterally with slight redness from shoes rubbing outer edges.          Assessment & Plan:   Problem List Items Addressed This Visit       Cardiovascular and Mediastinum   Severe aortic stenosis   Relevant Orders   Ambulatory referral to Cardiology     Endocrine   Hypothyroidism   Relevant Orders   Lipid panel   Comprehensive metabolic panel   Hemoglobin A1c   CBC with Differential/Platelet   TSH   Microalbumin / creatinine urine ratio   Type 2 diabetes mellitus (HCC)   Relevant Orders   Lipid panel   Comprehensive metabolic panel   Hemoglobin A1c   CBC with Differential/Platelet   TSH   Microalbumin / creatinine urine ratio     Genitourinary   Chronic kidney disease (CKD) stage G3b/A1, moderately decreased glomerular filtration rate (GFR) between 30-44 mL/min/1.73 square meter and albuminuria creatinine ratio less than 30  mg/g (HCC)   Relevant Orders   Lipid panel   Comprehensive metabolic panel   Hemoglobin A1c   CBC with Differential/Platelet   TSH   Microalbumin / creatinine urine ratio     Other   Iron deficiency anemia   Relevant Orders   Lipid panel   Comprehensive metabolic panel   Hemoglobin A1c   CBC with Differential/Platelet   TSH   Microalbumin / creatinine urine ratio   Other Visit Diagnoses     Medicare annual wellness visit, subsequent    -  Primary   Essential hypertension       Relevant Orders   Lipid panel   Comprehensive metabolic panel   Hemoglobin A1c   CBC with Differential/Platelet   TSH   Microalbumin / creatinine urine ratio   Well woman exam (no gynecological exam)       Relevant Orders   Lipid panel   Comprehensive metabolic panel   Hemoglobin A1c   CBC with Differential/Platelet   TSH   Microalbumin / creatinine urine ratio      Decrease Lisinopril to 1/2 tab (10 mg) daily. Check BP daily and send report to office.  Patient has not been seen by cardiology since 2019. Refer back to Dr. Domenic Polite for recheck. Encouraged activity as tolerated especially walking. Recommend daily MVI for women over 50. Reviewed appropriate vaccines. Plans to get flu vaccine this fall. Consider bone density in a few months.  Mammogram scheduled for 9/29. Return in about 3 months (around 02/18/2021). Call back sooner if needed.

## 2020-11-19 NOTE — Patient Instructions (Addendum)
Take half pill of Lisinopril. Cut current pill in half.  We will refer to cardiologists with Dr Domenic Polite. Chewable multivitamin daily.  Monitor blood pressure twice daily and send results next week to Dr. Sumner Boast.  If feeling fatigued or dizzy take blood pressure.

## 2020-11-20 ENCOUNTER — Encounter: Payer: Self-pay | Admitting: Nurse Practitioner

## 2020-11-23 DIAGNOSIS — E039 Hypothyroidism, unspecified: Secondary | ICD-10-CM | POA: Diagnosis not present

## 2020-11-23 DIAGNOSIS — E1122 Type 2 diabetes mellitus with diabetic chronic kidney disease: Secondary | ICD-10-CM | POA: Diagnosis not present

## 2020-11-23 DIAGNOSIS — N1832 Chronic kidney disease, stage 3b: Secondary | ICD-10-CM | POA: Diagnosis not present

## 2020-11-23 DIAGNOSIS — I10 Essential (primary) hypertension: Secondary | ICD-10-CM | POA: Diagnosis not present

## 2020-11-23 DIAGNOSIS — D509 Iron deficiency anemia, unspecified: Secondary | ICD-10-CM | POA: Diagnosis not present

## 2020-11-24 LAB — CBC WITH DIFFERENTIAL/PLATELET
Basophils Absolute: 0 10*3/uL (ref 0.0–0.2)
Basos: 1 %
EOS (ABSOLUTE): 0.5 10*3/uL — ABNORMAL HIGH (ref 0.0–0.4)
Eos: 7 %
Hematocrit: 31.9 % — ABNORMAL LOW (ref 34.0–46.6)
Hemoglobin: 10.5 g/dL — ABNORMAL LOW (ref 11.1–15.9)
Immature Grans (Abs): 0 10*3/uL (ref 0.0–0.1)
Immature Granulocytes: 0 %
Lymphocytes Absolute: 1.9 10*3/uL (ref 0.7–3.1)
Lymphs: 30 %
MCH: 28.5 pg (ref 26.6–33.0)
MCHC: 32.9 g/dL (ref 31.5–35.7)
MCV: 86 fL (ref 79–97)
Monocytes Absolute: 0.5 10*3/uL (ref 0.1–0.9)
Monocytes: 8 %
Neutrophils Absolute: 3.5 10*3/uL (ref 1.4–7.0)
Neutrophils: 54 %
Platelets: 171 10*3/uL (ref 150–450)
RBC: 3.69 x10E6/uL — ABNORMAL LOW (ref 3.77–5.28)
RDW: 14.8 % (ref 11.7–15.4)
WBC: 6.4 10*3/uL (ref 3.4–10.8)

## 2020-11-24 LAB — LIPID PANEL
Chol/HDL Ratio: 2.9 ratio (ref 0.0–4.4)
Cholesterol, Total: 115 mg/dL (ref 100–199)
HDL: 40 mg/dL (ref >39)
LDL Chol Calc (NIH): 55 mg/dL (ref 0–99)
Triglycerides: 111 mg/dL (ref 0–149)
VLDL Cholesterol Cal: 20 mg/dL (ref 5–40)

## 2020-11-24 LAB — COMPREHENSIVE METABOLIC PANEL
ALT: 13 IU/L (ref 0–32)
AST: 30 IU/L (ref 0–40)
Albumin/Globulin Ratio: 1.8 (ref 1.2–2.2)
Albumin: 4.3 g/dL (ref 3.8–4.8)
Alkaline Phosphatase: 69 IU/L (ref 44–121)
BUN/Creatinine Ratio: 16 (ref 12–28)
BUN: 18 mg/dL (ref 8–27)
Bilirubin Total: 0.4 mg/dL (ref 0.0–1.2)
CO2: 24 mmol/L (ref 20–29)
Calcium: 10 mg/dL (ref 8.7–10.3)
Chloride: 102 mmol/L (ref 96–106)
Creatinine, Ser: 1.15 mg/dL — ABNORMAL HIGH (ref 0.57–1.00)
Globulin, Total: 2.4 g/dL (ref 1.5–4.5)
Glucose: 60 mg/dL — ABNORMAL LOW (ref 65–99)
Potassium: 3.5 mmol/L (ref 3.5–5.2)
Sodium: 138 mmol/L (ref 134–144)
Total Protein: 6.7 g/dL (ref 6.0–8.5)
eGFR: 52 mL/min/{1.73_m2} — ABNORMAL LOW (ref >59)

## 2020-11-24 LAB — MICROALBUMIN / CREATININE URINE RATIO
Creatinine, Urine: 33.7 mg/dL
Microalb/Creat Ratio: <9 mg/g creat (ref 0–29)
Microalbumin, Urine: <3 ug/mL

## 2020-11-24 LAB — HEMOGLOBIN A1C
Est. average glucose Bld gHb Est-mCnc: 111 mg/dL
Hgb A1c MFr Bld: 5.5 % (ref 4.8–5.6)

## 2020-11-24 LAB — TSH: TSH: 0.146 u[IU]/mL — ABNORMAL LOW (ref 0.450–4.500)

## 2020-11-25 ENCOUNTER — Telehealth: Payer: Self-pay | Admitting: Nurse Practitioner

## 2020-11-25 ENCOUNTER — Other Ambulatory Visit: Payer: Self-pay | Admitting: Nurse Practitioner

## 2020-11-25 NOTE — Telephone Encounter (Signed)
Pt states Hydrocodone has not been sent to [facility]. Pt was seen 11/19/20 for wellness. Please advise. Thank you

## 2020-11-26 ENCOUNTER — Other Ambulatory Visit: Payer: Self-pay | Admitting: Nurse Practitioner

## 2020-11-26 MED ORDER — HYDROCODONE-ACETAMINOPHEN 5-325 MG PO TABS: ORAL_TABLET | ORAL | 0 refills | Status: DC

## 2020-11-26 NOTE — Telephone Encounter (Signed)
Pt daughter in law [employer]) informed and my chart message also sent to patient.

## 2020-11-27 ENCOUNTER — Other Ambulatory Visit: Payer: Self-pay | Admitting: Nurse Practitioner

## 2020-11-27 DIAGNOSIS — E1122 Type 2 diabetes mellitus with diabetic chronic kidney disease: Secondary | ICD-10-CM

## 2020-11-27 DIAGNOSIS — N1832 Chronic kidney disease, stage 3b: Secondary | ICD-10-CM

## 2020-11-27 DIAGNOSIS — E039 Hypothyroidism, unspecified: Secondary | ICD-10-CM

## 2020-11-27 DIAGNOSIS — D649 Anemia, unspecified: Secondary | ICD-10-CM

## 2020-11-27 DIAGNOSIS — F419 Anxiety disorder, unspecified: Secondary | ICD-10-CM

## 2020-11-27 DIAGNOSIS — E785 Hyperlipidemia, unspecified: Secondary | ICD-10-CM

## 2020-11-27 DIAGNOSIS — I350 Nonrheumatic aortic (valve) stenosis: Secondary | ICD-10-CM

## 2020-11-27 DIAGNOSIS — Z8719 Personal history of other diseases of the digestive system: Secondary | ICD-10-CM

## 2020-11-27 DIAGNOSIS — M1000 Idiopathic gout, unspecified site: Secondary | ICD-10-CM

## 2020-11-27 DIAGNOSIS — I10 Essential (primary) hypertension: Secondary | ICD-10-CM

## 2020-11-27 MED ORDER — LEVOTHYROXINE SODIUM 125 MCG PO TABS: 125.0000 ug | ORAL_TABLET | Freq: Every day | ORAL | 0 refills | Status: DC

## 2020-11-29 ENCOUNTER — Other Ambulatory Visit: Payer: Self-pay | Admitting: Nurse Practitioner

## 2020-11-29 DIAGNOSIS — E039 Hypothyroidism, unspecified: Secondary | ICD-10-CM

## 2020-11-29 DIAGNOSIS — Z1211 Encounter for screening for malignant neoplasm of colon: Secondary | ICD-10-CM

## 2020-11-29 DIAGNOSIS — I10 Essential (primary) hypertension: Secondary | ICD-10-CM

## 2020-12-01 ENCOUNTER — Telehealth: Payer: Self-pay | Admitting: *Deleted

## 2020-12-01 NOTE — Chronic Care Management (AMB) (Signed)
  Chronic Care Management   Note  12/01/2020 Name: Sabrina Deleon MRN: [id number] DOB: 31-Jan-1951  [name] is a 70 y.o. year old female who is a primary care patient of Luking, Elayne Snare, MD. I reached out to [employer] by phone today in response to a referral sent by [name]'s PCP, Dr. Wolfgang Phoenix      [name] was given information about Chronic Care Management services today including:  CCM service includes personalized support from designated clinical staff supervised by her physician, including individualized plan of care and coordination with other care providers 24/7 contact phone numbers for assistance for urgent and routine care needs. Service will only be billed when office clinical staff spend 20 minutes or more in a month to coordinate care. Only one practitioner may furnish and bill the service in a calendar month. The patient may stop CCM services at any time (effective at the end of the month) by phone call to the office staff. The patient will be responsible for cost sharing (co-pay) of up to 20% of the service fee (after annual deductible is met).  Patient agreed to services and verbal consent obtained.   Follow up plan: Face to Face appointment with care management team member scheduled for: 12/06/20  [place] [facility]  Direct Dial: [phone]

## 2020-12-05 ENCOUNTER — Other Ambulatory Visit: Payer: Self-pay | Admitting: Family Medicine

## 2020-12-06 ENCOUNTER — Other Ambulatory Visit: Payer: Self-pay

## 2020-12-06 ENCOUNTER — Other Ambulatory Visit: Payer: Self-pay | Admitting: Family Medicine

## 2020-12-06 ENCOUNTER — Ambulatory Visit (INDEPENDENT_AMBULATORY_CARE_PROVIDER_SITE_OTHER): Payer: Medicare Other | Admitting: Pharmacist

## 2020-12-06 VITALS — BP 135/57 | HR 58

## 2020-12-06 DIAGNOSIS — F419 Anxiety disorder, unspecified: Secondary | ICD-10-CM

## 2020-12-06 DIAGNOSIS — I10 Essential (primary) hypertension: Secondary | ICD-10-CM

## 2020-12-06 DIAGNOSIS — N1832 Chronic kidney disease, stage 3b: Secondary | ICD-10-CM

## 2020-12-06 DIAGNOSIS — F32A Depression, unspecified: Secondary | ICD-10-CM

## 2020-12-06 DIAGNOSIS — E785 Hyperlipidemia, unspecified: Secondary | ICD-10-CM

## 2020-12-06 DIAGNOSIS — E1122 Type 2 diabetes mellitus with diabetic chronic kidney disease: Secondary | ICD-10-CM

## 2020-12-06 MED ORDER — LISINOPRIL 20 MG PO TABS: 10.0000 mg | ORAL_TABLET | Freq: Every day | ORAL | 3 refills | Status: DC

## 2020-12-06 NOTE — Patient Instructions (Signed)
Sabrina Deleon,  It was great to talk to you today!  Please call me with any questions or concerns.   Visit Information   PATIENT GOALS:   Goals Addressed             This Visit's Progress    Medication Management       Patient Goals/Self-Care Activities Over the next 90 days, patient will:  Take medications as prescribed Check blood sugar three times a day at the following times: fasting (at least 8 hours since last food consumption), 5-15 minutes before breakfast, 5-15 minutes before dinner, and whenever patient experiences symptoms of hypo/hyperglycemia, document, and provide at future appointments Check blood pressure at least once daily, document, and provide at future appointments Collaborate with provider on medication access solutions        Consent to CCM Services: Ms. Armas was given information about Chronic Care Management services including:  CCM service includes personalized support from designated clinical staff supervised by her physician, including individualized plan of care and coordination with other care providers 24/7 contact phone numbers for assistance for urgent and routine care needs. Service will only be billed when office clinical staff spend 20 minutes or more in a month to coordinate care. Only one practitioner may furnish and bill the service in a calendar month. The patient may stop CCM services at any time (effective at the end of the month) by phone call to the office staff. The patient will be responsible for cost sharing (co-pay) of up to 20% of the service fee (after annual deductible is met).  Patient agreed to services and verbal consent obtained.   Patient verbalizes understanding of instructions provided today and agrees to view in [place].   Telephone follow up appointment with care management team member scheduled for: 01/03/21  Kennon Holter, PharmD Clinical Pharmacist [place] [phone]  CLINICAL  CARE PLAN: Patient Care Plan: Medication Management     Problem Identified: Hypertension, Type-2 diabetes, Chronic kidney disease, Hyperlipidemia, Anxiety/Depression   Priority: High  Onset Date: 12/06/2020     Long-Range Goal: Disease Progression Prevention   Start Date: 12/06/2020  Expected End Date: 03/06/2021  This Visit's Progress: On track  Priority: High  Note:   Current Barriers:  Unable to independently monitor therapeutic efficacy Suboptimal therapeutic regimen for chronic kidney disease  Pharmacist Clinical Goal(s):  Over the next 90 days, patient will Adhere to plan to optimize therapeutic regimen for chronic kidney disease as evidenced by report of adherence to recommended medication management changes through collaboration with PharmD and provider.   Interventions: 1:1 collaboration with Kathyrn Drown, MD regarding development and update of comprehensive plan of care as evidenced by provider attestation and co-signature Inter-disciplinary care team collaboration (see longitudinal plan of care) Comprehensive medication review performed; medication list updated in electronic medical record  Type 2 Diabetes: Controlled; Most recent A1c at goal of <7% per ADA guidelines Current medications: glipizide IR 2.5 mg by mouth once daily Intolerances: none; patient has history of taking metformin (presumably stopped due to worsening renal function) and DPP-4 inhibitors (Tradjenta and Onglyza). Unclear why DPP-4 inhibitors discontinued. Taking medications as directed: no, patient was instructed by Pearson Forster, NP to discontinue glipizide; however, patient reports after a few days off the glipizide her blood fasting and post-prandial blood glucose increased and she restarted glipizide but only once daily instead of twice daily  Side effects thought to be attributed to current medication regimen: no Patient denies recent hypoglycemia Hypoglycemia prevention:  not indicated at this  time Current meal patterns:  Patient reports trying to eat healthier in general. Reports 84 lb weight loss over last 2 years with improvement in diet. She does drink diet cranberry juice and diet coke but otherwise mostly drinks water Current exercise:  walks some On a statin: yes On aspirin 81 mg daily: yes Last microalbumin: <3 (11/23/20); on an ACEi/ARB: yes Last eye exam: unknown Last foot exam: unknown Pneumonia vaccine: series complete Influenza vaccine:  needs this fall Current glucose readings:  patient did not bring glucometer today but reports that when she was taking glipizide twice daily before meals her fasting blood glucose was usually 99-120 but when she stopped glipizide recently as instructed, her fasting blood glucose increased to 140-160s. She also reports a recent post-prandial blood glucose of 275 while "off" glipizide. Recommend to continue glipizide IR 2.5 mg by mouth once daily for now. Do not take if blood glucose <100. Will plan to discontinue if able to initiate SGLT2 inhibitor Instructed to monitor blood sugars three times a day at the following times: fasting (at least 8 hours since last food consumption), 5-15 minutes before breakfast, 5-15 minutes before dinner, and whenever patient experiences symptoms of hypo/hyperglycemia  Patient identified as a good candidate for SGLT-2 inhibitor given slowed chronic kidney disease progression, low risk of hypoglycemia, weight loss, and blood pressure lowering. Patient denies a history of significant genitourinary infections. May consider decreasing dose of furosemide to lower concern for hypotension/volume depletion. GFR at least >20 mL/minute/1.73 m2.   Hypertension: Blood pressure under good control. Blood pressure is at goal of <130/80 mmHg per 2017 AHA/ACC guidelines. Current medications: lisinopril 10 mg by mouth once daily and furosemide 40 mg once daily  Of note, lisinopril recently decreased from 20 mg to 10 mg by Pearson Forster, NP Intolerances: none Taking medications as directed: yes Side effects thought to be attributed to current medication regimen: no Patient reports less dizziness/lightheadedness since decreasing dose of lisinopril Current home blood pressure: patient checks periodically. Verbally reports they are usually 120s/60s. Patient is unsure of home pulse Continue lisinopril 10 mg by mouth once daily and furosemide 40 mg once daily Encourage dietary sodium restriction/DASH diet Recommend home blood pressure monitoring to discuss at next visit  Hyperlipidemia: Controlled. LDL at goal of <70 due to very high risk given diabetes + at least 1 additional major risk factor (hypertension and chronic kidney disease (CKD)) per 2020 AACE/ACE guidelines. Triglycerides at goal of <150 per 2020 AACE/ACE guidelines.. Current medications: rosuvastatin 10 mg by mouth once daily Intolerances: none Taking medications as directed: yes Side effects thought to be attributed to current medication regimen: no Continue rosuvastatin 10 mg by mouth once daily Reviewed risks of hyperlipidemia, principles of treatment and consequences of untreated hyperlipidemia  Chronic Kidney Disease Stage 3a - GFR 45-59 (Mild to Moderately Reduced Function): Suboptimally managed Current medications: lisinopril 10 mg by mouth once daily, rosuvastatin 10 mg by mouth once daily, and furosemide 40 mg once daily Intolerances: none Taking medications as directed: yes Side effects thought to be attributed to current medication regimen: no Most recent GFR: 52 mL/min which is improved from 41 mL/min Most recent microalbumin: <3 (11/23/20) Continue lisinopril 10 mg by mouth once daily, rosuvastatin 10 mg by mouth once daily, and furosemide 40 mg once daily Encouraged healthy diet including more fruits and vegetables Recommend adequate hydration  Patient identified as a good candidate for SGLT-2 inhibitor given slowed chronic kidney disease  progression, low  risk of hypoglycemia, weight loss, and blood pressure lowering. Patient denies a history of significant genitourinary infections. May consider decreasing dose of furosemide to lower concern for hypotension/volume depletion. GFR at least >20 mL/minute/1.73 m2.  Depression/Anxiety: Current medication: citalopram 20 mg by mouth daily  Patient reports that she self decreased from 30 mg to 20 mg daily. She expressed interest in trying to wean off this medication. She does report some occasional life stressors at this time.   Patient Goals/Self-Care Activities Over the next 90 days, patient will:  Take medications as prescribed Check blood sugar three times a day at the following times: fasting (at least 8 hours since last food consumption), 5-15 minutes before breakfast, 5-15 minutes before dinner, and whenever patient experiences symptoms of hypo/hyperglycemia, document, and provide at future appointments Check blood pressure at least once daily, document, and provide at future appointments Collaborate with provider on medication access solutions  Follow Up Plan: Telephone follow up appointment with care management team member scheduled for: 01/03/21

## 2020-12-06 NOTE — Chronic Care Management (AMB) (Signed)
Chronic Care Management Pharmacy Note  12/06/2020 Name:  Sabrina Deleon MRN:  [id number] DOB:  1951-01-04  Summary:  Patient was instructed by Pearson Forster, NP to discontinue glipizide; however, patient reports after a few days off the glipizide her blood fasting and post-prandial blood glucose increased and she restarted glipizide but only once daily instead of twice daily Current glucose readings:  patient did not bring glucometer today but reports that when she was taking glipizide twice daily before meals her fasting blood glucose was usually 99-120 but when she stopped glipizide recently as instructed, her fasting blood glucose increased to 140-160s. She also reports a recent post-prandial blood glucose of 275 while "off" glipizide. Recommend to continue glipizide IR 2.5 mg by mouth once daily for now. Do not take if blood glucose <100. Will plan to discontinue if able to initiate SGLT2 inhibitor.   Type-2 diabetes/Chronic kidney disease Patient identified as a good candidate for SGLT-2 inhibitor given slowed chronic kidney disease progression, low risk of hypoglycemia, weight loss, and blood pressure lowering. Patient denies a history of significant genitourinary infections. May consider decreasing dose of furosemide to lower concern for hypotension/volume depletion. GFR at least >20 mL/minute/1.73 m2. If PCP and patient in agreement to pursue treatment with SGLT2 inhibitor, based on financial information provided to me in today's visit, patient should qualify for patient assistance program for either Jardiance or Farxiga which means the patient would possibly be able to receive free medication.  Discussed benefits/risks of SGLT2 inhibitor treatment with patient today  Subjective: Sabrina Deleon is an 70 y.o. year old female who is a primary patient of Luking, Elayne Snare, MD.  The CCM team was consulted for assistance with disease management and care coordination needs.    Engaged with  patient face to face for initial visit in response to provider referral for pharmacy case management and/or care coordination services.   Consent to Services:  The patient was given the following information about Chronic Care Management services today, agreed to services, and gave verbal consent: 1. CCM service includes personalized support from designated clinical staff supervised by the primary care provider, including individualized plan of care and coordination with other care providers 2. 24/7 contact phone numbers for assistance for urgent and routine care needs. 3. Service will only be billed when office clinical staff spend 20 minutes or more in a month to coordinate care. 4. Only one practitioner may furnish and bill the service in a calendar month. 5.The patient may stop CCM services at any time (effective at the end of the month) by phone call to the office staff. 6. The patient will be responsible for cost sharing (co-pay) of up to 20% of the service fee (after annual deductible is met). Patient agreed to services and consent obtained.  Patient Care Team: Kathyrn Drown, MD as PCP - General (Family Medicine) Satira Sark, MD as Consulting Physician (Cardiology) Beryle Lathe, [facility] (Pharmacist)  Objective:  Lab Results  Component Value Date   CREATININE 1.15 (H) 11/23/2020   CREATININE 1.40 (H) 05/21/2020   CREATININE 1.33 (H) 12/05/2019    Lab Results  Component Value Date   HGBA1C 5.5 11/23/2020   Last diabetic Eye exam:  Lab Results  Component Value Date/Time   HMDIABEYEEXA Retinopathy (A) 05/09/2019 03:37 PM    Last diabetic Foot exam: No results found for: HMDIABFOOTEX      Component Value Date/Time   CHOL 115 11/23/2020 1019   TRIG 111 11/23/2020  1019   HDL 40 11/23/2020 1019   CHOLHDL 2.9 11/23/2020 1019   LDLCALC 55 11/23/2020 1019    Hepatic Function Latest Ref Rng & Units 11/23/2020 12/05/2019 09/06/2018  Total Protein 6.0 - 8.5 g/dL 6.7 7.3  6.7  Albumin 3.8 - 4.8 g/dL 4.3 4.5 4.4  AST 0 - 40 IU/L 30 38 36  ALT 0 - 32 IU/L _0 Alk Phosphatase 44 - 121 IU/L 69 69 59  Total Bilirubin 0.0 - 1.2 mg/dL 0.4 0.5 0.4  Bilirubin, Direct 0.00 - 0.40 mg/dL - 0.11 0.12    Lab Results  Component Value Date/Time   TSH 0.146 (L) 11/23/2020 10:19 AM   TSH 1.120 05/21/2020 08:20 AM    CBC Latest Ref Rng & Units 11/23/2020 12/05/2019 11/05/2018  WBC 3.4 - 10.8 x10E3/uL 6.4 7.3 10.1  Hemoglobin 11.1 - 15.9 g/dL 10.5(L) 11.5 8.6(L)  Hematocrit 34.0 - 46.6 % 31.9(L) 34.2 25.5(L)  Platelets 150 - 450 x10E3/uL 171 186 163    No results found for: VD25OH  Clinical ASCVD: No  The ASCVD Risk score (Arnett DK, et al., 2019) failed to calculate for the following reasons:   The valid total cholesterol range is 130 to 320 mg/dL    Social History   Tobacco Use  Smoking Status Former   Types: Cigarettes   Quit date: 10/29/1981   Years since quitting: 39.1  Smokeless Tobacco Never   BP Readings from Last 3 Encounters:  12/06/20 (!) 135/57  11/19/20 120/60  08/20/20 (!) 142/78   Pulse Readings from Last 3 Encounters:  12/06/20 (!) 58  11/19/20 (!) 59  08/20/20 64   Wt Readings from Last 3 Encounters:  11/19/20 199 lb (90.3 kg)  08/20/20 211 lb (95.7 kg)  05/21/20 224 lb (101.6 kg)    Assessment: Review of patient past medical history, allergies, medications, health status, including review of consultants reports, laboratory and other test data, was performed as part of comprehensive evaluation and provision of chronic care management services.   SDOH:  (Social Determinants of Health) assessments and interventions performed:    CCM Care Plan  No Known Allergies  Medications Reviewed Today     Reviewed by Beryle Lathe, [facility] (Pharmacist) on 12/06/20 at 0931  Med List Status: <None>   Medication Order Taking? Sig Documenting Provider Last Dose Status Informant  allopurinol (ZYLOPRIM) 100 MG tablet [id number] Yes  TAKE 1 TABLET(100 MG) BY MOUTH TWICE DAILY Nilda Simmer, NP Taking Active   aspirin EC 81 MG tablet [id number] Yes Take 81 mg by mouth daily. [provider] Taking Active Self  Cholecalciferol (VITAMIN D PO) [id number] Yes Take 2,000 Units by mouth daily. [provider] Taking Active Self  citalopram (CELEXA) 20 MG tablet [id number] Yes TAKE 1 AND 1/2 TABLETS(30 MG) BY MOUTH DAILY  Patient taking differently: Take 20 mg by mouth daily.   Kathyrn Drown, MD Taking Active   colchicine 0.6 MG tablet [id number] No TAKE 1 TABLET BY MOUTH TWICE DAILY AS NEEDED FOR GOUT FLAREUP  Patient not taking: Reported on 12/06/2020   Kathyrn Drown, MD Not Taking Active   ferrous sulfate 325 (65 FE) MG tablet [id number] Yes Take 325 mg by mouth daily with breakfast. [provider] Taking Active Self  furosemide (LASIX) 40 MG tablet [id number] Yes TAKE 1 TABLET(40 MG) BY MOUTH DAILY Luking, Elayne Snare, MD Taking Active   glipiZIDE (GLUCOTROL) 5 MG tablet [id number] Yes TAKE 1/2 TABLET(2.5 MG) BY MOUTH  TWICE DAILY  Patient taking differently: Take 2.5 mg by mouth daily.   Kathyrn Drown, MD Taking Active   HYDROcodone-acetaminophen (NORCO/VICODIN) 5-325 MG tablet [id number] Yes Take one tab po TID prn pain Nilda Simmer, NP Taking Active            Med Note Rhea Belton Dec 06, 2020  9:16 AM) Patient reports only taking half tablet twice daily   ketoconazole (NIZORAL) 2 % cream [id number] Yes Apply BID to affected area prn Kathyrn Drown, MD Taking Active   levothyroxine (SYNTHROID) 125 MCG tablet [id number] Yes Take 1 tablet (125 mcg total) by mouth daily. Nilda Simmer, NP Taking Active   lisinopril (ZESTRIL) 20 MG tablet [id number] Yes Take 0.5 tablets (10 mg total) by mouth daily. Nilda Simmer, NP Taking Active   MISC NATURAL PRODUCTS PO [id number] Yes Take 1 tablet by mouth daily. Beet Root Chewable [provider] Taking Active   Multiple  Vitamin (MULTIVITAMIN WITH MINERALS) TABS tablet [id number] Yes Take 1 tablet by mouth daily. [provider] Taking Active Self  Donald Siva test strip [id number] Yes TEST ONCE DAILY Wolfgang Phoenix, Elayne Snare, MD Taking Active   pantoprazole (PROTONIX) 40 MG tablet [id number] Yes TAKE 1 TABLET(40 MG) BY MOUTH DAILY  Patient taking differently: Take 40 mg by mouth every other day.   Kathyrn Drown, MD Taking Active Self  rosuvastatin (CRESTOR) 10 MG tablet [id number] Yes TAKE 1 TABLET(10 MG) BY MOUTH DAILY FOR CHOLESTEROL. STOP WHILE TAKING COLCHICINE FOR GOUT Luking, Elayne Snare, MD Taking Active   triamcinolone cream (KENALOG) 0.1 % [id number] Yes Apply 1 application topically 2 (two) times daily. Prn rash; use up to 2 weeks Nilda Simmer, NP Taking Active   zolpidem (AMBIEN) 5 MG tablet [id number] Yes TAKE 1 TABLET BY MOUTH EVERY DAY AT BEDTIME Kathyrn Drown, MD Taking Active             Patient Active Problem List   Diagnosis Date Noted   Postcholecystectomy diarrhea 12/26/2019   Closed fracture of right proximal humerus 11/04/2018   Primary osteoarthritis involving multiple joints 09/12/2018   Morbid obesity ([place]) 03/29/2018   Severe aortic stenosis    Iron deficiency anemia 10/13/2017   Anxiety and depression 04/28/2017   Encounter for long-term opiate analgesic use 04/28/2017   Psychophysiological insomnia 04/28/2017   OA (osteoarthritis) of hip 03/22/2016   Chronic right hip pain 10/29/2015   Chronic kidney disease (CKD) stage G3b/A1, moderately decreased glomerular filtration rate (GFR) between 30-44 mL/min/1.73 square meter and albuminuria creatinine ratio less than 30 mg/g (HCC) 06/03/2015   Type 2 diabetes mellitus ([place]) 23/55/7322   Diastolic dysfunction, left ventricle 05/16/2013   Calcific aortic stenosis 05/16/2013   Gout 05/12/2013   Diabetic retinopathy ([place]) 12/17/2012   Hypertension 06/10/2012   Hyperlipidemia 06/10/2012   Hypothyroidism 06/10/2012     Immunization History  Administered Date(s) Administered   Fluad Quad(high Dose 65+) 12/27/2019   Influenza,inj,Quad PF,6+ Mos 12/04/2014, 12/21/2017   Influenza-Unspecified 01/09/2016, 11/26/2018, 12/26/2019   Moderna Sars-Covid-2 Vaccination 09/06/2019   Pneumococcal Conjugate-13 12/21/2017   Pneumococcal Polysaccharide-23 09/08/2016    Conditions to be addressed/monitored: HTN, HLD, DMII, CKD Stage 3a, Anxiety, and Depression  Care Plan : Medication Management  Updates made by Beryle Lathe, [place] since 12/06/2020 12:00 AM     Problem: Hypertension, Type-2 diabetes, Chronic kidney disease, Hyperlipidemia, Anxiety/Depression   Priority: High  Onset Date: 12/06/2020     Long-Range Goal:  Disease Progression Prevention   Start Date: 12/06/2020  Expected End Date: 03/06/2021  This Visit's Progress: On track  Priority: High  Note:   Current Barriers:  Unable to independently monitor therapeutic efficacy Suboptimal therapeutic regimen for chronic kidney disease  Pharmacist Clinical Goal(s):  Over the next 90 days, patient will Adhere to plan to optimize therapeutic regimen for chronic kidney disease as evidenced by report of adherence to recommended medication management changes through collaboration with PharmD and provider.   Interventions: 1:1 collaboration with Kathyrn Drown, MD regarding development and update of comprehensive plan of care as evidenced by provider attestation and co-signature Inter-disciplinary care team collaboration (see longitudinal plan of care) Comprehensive medication review performed; medication list updated in electronic medical record  Type 2 Diabetes: Controlled; Most recent A1c at goal of <7% per ADA guidelines Current medications: glipizide IR 2.5 mg by mouth once daily Intolerances: none; patient has history of taking metformin (presumably stopped due to worsening renal function) and DPP-4 inhibitors (Tradjenta and Onglyza). Unclear  why DPP-4 inhibitors discontinued. Taking medications as directed: no, patient was instructed by Pearson Forster, NP to discontinue glipizide; however, patient reports after a few days off the glipizide her blood fasting and post-prandial blood glucose increased and she restarted glipizide but only once daily instead of twice daily  Side effects thought to be attributed to current medication regimen: no Patient denies recent hypoglycemia Hypoglycemia prevention: not indicated at this time Current meal patterns:  Patient reports trying to eat healthier in general. Reports 84 lb weight loss over last 2 years with improvement in diet. She does drink diet cranberry juice and diet coke but otherwise mostly drinks water Current exercise:  walks some On a statin: yes On aspirin 81 mg daily: yes Last microalbumin: <3 (11/23/20); on an ACEi/ARB: yes Last eye exam: unknown Last foot exam: unknown Pneumonia vaccine: series complete Influenza vaccine:  needs this fall Current glucose readings:  patient did not bring glucometer today but reports that when she was taking glipizide twice daily before meals her fasting blood glucose was usually 99-120 but when she stopped glipizide recently as instructed, her fasting blood glucose increased to 140-160s. She also reports a recent post-prandial blood glucose of 275 while "off" glipizide. Recommend to continue glipizide IR 2.5 mg by mouth once daily for now. Do not take if blood glucose <100. Will plan to discontinue if able to initiate SGLT2 inhibitor Instructed to monitor blood sugars three times a day at the following times: fasting (at least 8 hours since last food consumption), 5-15 minutes before breakfast, 5-15 minutes before dinner, and whenever patient experiences symptoms of hypo/hyperglycemia  Patient identified as a good candidate for SGLT-2 inhibitor given slowed chronic kidney disease progression, low risk of hypoglycemia, weight loss, and blood pressure  lowering. Patient denies a history of significant genitourinary infections. May consider decreasing dose of furosemide to lower concern for hypotension/volume depletion. GFR at least >20 mL/minute/1.73 m2.   Hypertension: Blood pressure under good control. Blood pressure is at goal of <130/80 mmHg per 2017 AHA/ACC guidelines. Current medications: lisinopril 10 mg by mouth once daily and furosemide 40 mg once daily  Of note, lisinopril recently decreased from 20 mg to 10 mg by Pearson Forster, NP Intolerances: none Taking medications as directed: yes Side effects thought to be attributed to current medication regimen: no Patient reports less dizziness/lightheadedness since decreasing dose of lisinopril Current home blood pressure: patient checks periodically. Verbally reports they are usually 120s/60s. Patient is unsure of  home pulse Continue lisinopril 10 mg by mouth once daily and furosemide 40 mg once daily Encourage dietary sodium restriction/DASH diet Recommend home blood pressure monitoring to discuss at next visit  Hyperlipidemia: Controlled. LDL at goal of <70 due to very high risk given diabetes + at least 1 additional major risk factor (hypertension and chronic kidney disease (CKD)) per 2020 AACE/ACE guidelines. Triglycerides at goal of <150 per 2020 AACE/ACE guidelines.. Current medications: rosuvastatin 10 mg by mouth once daily Intolerances: none Taking medications as directed: yes Side effects thought to be attributed to current medication regimen: no Continue rosuvastatin 10 mg by mouth once daily Reviewed risks of hyperlipidemia, principles of treatment and consequences of untreated hyperlipidemia  Chronic Kidney Disease Stage 3a - GFR 45-59 (Mild to Moderately Reduced Function): Suboptimally managed Current medications: lisinopril 10 mg by mouth once daily, rosuvastatin 10 mg by mouth once daily, and furosemide 40 mg once daily Intolerances: none Taking medications as  directed: yes Side effects thought to be attributed to current medication regimen: no Most recent GFR: 52 mL/min which is improved from 41 mL/min Most recent microalbumin: <3 (11/23/20) Continue lisinopril 10 mg by mouth once daily, rosuvastatin 10 mg by mouth once daily, and furosemide 40 mg once daily Encouraged healthy diet including more fruits and vegetables Recommend adequate hydration  Patient identified as a good candidate for SGLT-2 inhibitor given slowed chronic kidney disease progression, low risk of hypoglycemia, weight loss, and blood pressure lowering. Patient denies a history of significant genitourinary infections. May consider decreasing dose of furosemide to lower concern for hypotension/volume depletion. GFR at least >20 mL/minute/1.73 m2.  Depression/Anxiety: Current medication: citalopram 20 mg by mouth daily  Patient reports that she self decreased from 30 mg to 20 mg daily. She expressed interest in trying to wean off this medication. She does report some occasional life stressors at this time.   Patient Goals/Self-Care Activities Over the next 90 days, patient will:  Take medications as prescribed Check blood sugar three times a day at the following times: fasting (at least 8 hours since last food consumption), 5-15 minutes before breakfast, 5-15 minutes before dinner, and whenever patient experiences symptoms of hypo/hyperglycemia, document, and provide at future appointments Check blood pressure at least once daily, document, and provide at future appointments Collaborate with provider on medication access solutions  Follow Up Plan: Telephone follow up appointment with care management team member scheduled for: 01/03/21      Medication Assistance:  None required at this time, but may consider patient assistance application if patient and PCP in agreement to pursue SGLT2 inhibitor  Patient's preferred pharmacy is:  [id number] - [place], [place] AT [place] [address] [name] [address] Phone: [phone] Fax: [phone]  Uses pill box? Yes  Follow Up:  Patient agrees to Care Plan and Follow-up.  Plan: Telephone follow up appointment with care management team member scheduled for:  01/03/21  Kennon Holter, PharmD Clinical Pharmacist [place] [phone]

## 2020-12-08 ENCOUNTER — Other Ambulatory Visit: Payer: Self-pay

## 2020-12-08 DIAGNOSIS — Z1211 Encounter for screening for malignant neoplasm of colon: Secondary | ICD-10-CM

## 2020-12-08 LAB — IFOBT (OCCULT BLOOD): IFOBT: NEGATIVE

## 2020-12-16 DIAGNOSIS — Z1231 Encounter for screening mammogram for malignant neoplasm of breast: Secondary | ICD-10-CM | POA: Diagnosis not present

## 2020-12-17 DIAGNOSIS — F419 Anxiety disorder, unspecified: Secondary | ICD-10-CM | POA: Diagnosis not present

## 2020-12-17 DIAGNOSIS — E1122 Type 2 diabetes mellitus with diabetic chronic kidney disease: Secondary | ICD-10-CM | POA: Diagnosis not present

## 2020-12-17 DIAGNOSIS — E785 Hyperlipidemia, unspecified: Secondary | ICD-10-CM | POA: Diagnosis not present

## 2020-12-17 DIAGNOSIS — N1832 Chronic kidney disease, stage 3b: Secondary | ICD-10-CM | POA: Diagnosis not present

## 2020-12-17 DIAGNOSIS — I10 Essential (primary) hypertension: Secondary | ICD-10-CM

## 2020-12-17 DIAGNOSIS — F32A Depression, unspecified: Secondary | ICD-10-CM

## 2020-12-24 NOTE — Progress Notes (Signed)
Noted  

## 2020-12-31 ENCOUNTER — Telehealth: Payer: Self-pay

## 2020-12-31 NOTE — Chronic Care Management (AMB) (Signed)
  Care Management   Note  12/31/2020 Name: NEITA LANDRIGAN MRN: [id number] DOB: February 15, 1951  Delicia R Toppin is a 70 y.o. year old female who is a primary care patient of Luking, Elayne Snare, MD and is actively engaged with the care management team. I reached out to Devoria Albe by phone today to assist with scheduling a follow up visit with the Pharmacist  Follow up plan: Patient declines further follow up and engagement by the care management team. Appropriate care team members and provider have been notified via electronic communication.   Noreene Larsson, [place], [place], [place] [address] Direct Dial: [phone] [email or web address]: [email or web address]

## 2020-12-31 NOTE — Telephone Encounter (Signed)
Patient stated that she was happy with her doctors and she did not feel comfortable speaking to you about her health.  Patient states that she doesn't feel she needs pharmacy assistance. I did advise her of what you do but she still declined scheduling  Thank you  Noreene Larsson, [place], [place], [place] [address] Direct Dial: [phone] [email or web address]: [email or web address]

## 2021-01-03 ENCOUNTER — Telehealth: Payer: Medicare Other

## 2021-01-11 ENCOUNTER — Other Ambulatory Visit: Payer: Self-pay | Admitting: Family Medicine

## 2021-01-11 ENCOUNTER — Telehealth: Payer: Self-pay | Admitting: Family Medicine

## 2021-01-11 MED ORDER — HYDROCODONE-ACETAMINOPHEN 5-325 MG PO TABS: ORAL_TABLET | ORAL | 0 refills | Status: DC

## 2021-01-11 MED ORDER — ZOLPIDEM TARTRATE 5 MG PO TABS: 5.0000 mg | ORAL_TABLET | Freq: Every day | ORAL | 3 refills | Status: DC

## 2021-01-11 NOTE — Telephone Encounter (Signed)
Patient is requesting refill on hydrocodone and ambiem called into [facility]

## 2021-01-11 NOTE — Telephone Encounter (Signed)
Pain management visit required for her pain meds I sent in a refill today but no further refills until a specific visit for pain management she is due for urine drug screen as well Please set up within the next 30 days

## 2021-01-17 NOTE — Telephone Encounter (Signed)
Per patient wishes she has been un-enrolled from chronic care management. I have removed myself from the care team. All chronic care management clinical and patient goals have been marked as "complete".   Kennon Holter, PharmD Clinical Pharmacist [place] [phone]

## 2021-01-25 ENCOUNTER — Ambulatory Visit: Payer: Medicare Other | Admitting: Cardiology

## 2021-02-09 ENCOUNTER — Ambulatory Visit: Payer: Medicare Other | Admitting: Family Medicine

## 2021-02-24 ENCOUNTER — Other Ambulatory Visit: Payer: Self-pay

## 2021-02-25 ENCOUNTER — Ambulatory Visit (INDEPENDENT_AMBULATORY_CARE_PROVIDER_SITE_OTHER): Payer: Medicare Other | Admitting: Nurse Practitioner

## 2021-02-25 ENCOUNTER — Other Ambulatory Visit: Payer: Self-pay | Admitting: Nurse Practitioner

## 2021-02-25 ENCOUNTER — Other Ambulatory Visit: Payer: Self-pay

## 2021-02-25 VITALS — BP 131/64 | HR 60 | Wt 191.0 lb

## 2021-02-25 DIAGNOSIS — I10 Essential (primary) hypertension: Secondary | ICD-10-CM

## 2021-02-25 DIAGNOSIS — M159 Polyosteoarthritis, unspecified: Secondary | ICD-10-CM | POA: Diagnosis not present

## 2021-02-25 DIAGNOSIS — N1832 Chronic kidney disease, stage 3b: Secondary | ICD-10-CM | POA: Diagnosis not present

## 2021-02-25 DIAGNOSIS — E1122 Type 2 diabetes mellitus with diabetic chronic kidney disease: Secondary | ICD-10-CM | POA: Diagnosis not present

## 2021-02-25 DIAGNOSIS — Z79891 Long term (current) use of opiate analgesic: Secondary | ICD-10-CM

## 2021-02-25 DIAGNOSIS — E039 Hypothyroidism, unspecified: Secondary | ICD-10-CM | POA: Diagnosis not present

## 2021-02-25 MED ORDER — HYDROCODONE-ACETAMINOPHEN 5-325 MG PO TABS: ORAL_TABLET | ORAL | 0 refills | Status: DC

## 2021-02-26 ENCOUNTER — Encounter: Payer: Self-pay | Admitting: Nurse Practitioner

## 2021-02-26 LAB — POTASSIUM: Potassium: 4.7 mmol/L (ref 3.5–5.2)

## 2021-02-26 LAB — TSH: TSH: 0.618 u[IU]/mL (ref 0.450–4.500)

## 2021-02-26 NOTE — Progress Notes (Signed)
SUBJECTIVE: This patient was seen today for chronic pain  The medication list was reviewed and updated.  Location of Pain for which the patient has been treated with regarding narcotics: multiple joints including hip and shoulder (previous surgeries); osteoarthritis  Onset of this pain: chronic   -Compliance with medication: yes  - Number patient states they take daily: usually 1/2 tab po BID if needed   -when was the last dose patient took? 2 days ago; has been out of medicine x 2 d  The patient was advised the importance of maintaining medication and not using illegal substances with these.  Here for refills and follow up  The patient was educated that we can provide 3 monthly scripts for their medication, it is their responsibility to follow the instructions.  Side effects or complications from medications: none  Patient is aware that pain medications are meant to minimize the severity of the pain to allow their pain levels to improve to allow for better function. They are aware of that pain medications cannot totally remove their pain.  Due for UDT ( at least once per year) : Oct 2021  Medication takes the edge off the pain allowing her to perform ADLs. Has been on the same dose long term and uses prn.  Also has her glucose log for her diabetes.  Blood sugars are running fasting 115-136.  In the evenings running 128-165.  Depends on what she eats.  These results are also on glipizide which was causing low blood sugars.  Continues to lose weight due to decreased calorie intake.  BP record shows BP 137-123/49-52.  Pulse 50-62.  Mainly experiences dizziness if she gets up from a lying or sitting position too quickly.  No syncopal episodes. No chest pain/ischemic type pain or unusual shortness of breath.  No lower extremity edema.  No orthopnea. Patient had her lab work done this morning.  OBJECTIVE: NAD.  Alert, oriented.  Calm cheerful affect.  Thyroid nontender to palpation, no  mass or goiter noted.  Lungs clear.  Heart regular rate rhythm.  Lower extremities no edema.  Today's Vitals   02/25/21 1120  BP: 131/64  Pulse: 60  SpO2: 99%  Weight: 191 lb (86.6 kg)   Body mass index is 29.04 kg/m.  ASSESSMENT: Problem List Items Addressed This Visit       Cardiovascular and Mediastinum   Hypertension     Endocrine   Type 2 diabetes mellitus (HCC)     Musculoskeletal and Integument   Primary osteoarthritis involving multiple joints   Relevant Medications   HYDROcodone-acetaminophen (NORCO/VICODIN) 5-325 MG tablet   HYDROcodone-acetaminophen (NORCO/VICODIN) 5-325 MG tablet   HYDROcodone-acetaminophen (NORCO/VICODIN) 5-325 MG tablet     Other   Encounter for long-term opiate analgesic use - Primary   PLAN:  Meds ordered this encounter  Medications   HYDROcodone-acetaminophen (NORCO/VICODIN) 5-325 MG tablet    Sig: Take one tab po bid prn pain    Dispense:  45 tablet    Refill:  0    Never take in the evening with Ambien    Order Specific Question:   Supervising Provider    Answer:   Sallee Lange A [[id number]]   HYDROcodone-acetaminophen (NORCO/VICODIN) 5-325 MG tablet    Sig: Take one tab po BID prn pain    Dispense:  45 tablet    Refill:  0    May fill 30 days from 02/25/21    Order Specific Question:   Supervising Provider    Answer:  Sallee Lange A [[id number]]   HYDROcodone-acetaminophen (NORCO/VICODIN) 5-325 MG tablet    Sig: Take one po BID prn pain    Dispense:  45 tablet    Refill:  0    May fill 60 days from 02/25/21    Order Specific Question:   Supervising Provider    Answer:   Sallee Lange A [[id number]]   Continue hydrocodone as directed.  Hold on further glipizide. Patient wishes to hold on SGLT2 medication at this time.  Discussed nephro protective effects. Continue lisinopril 20 mg daily but monitor blood pressure and if any dizziness or near syncope, may need to cut back to 10 mg a day at some point especially if continued weight  loss. Recommend recheck in 3 months with labs.  Depending on GFR at that time, patient may reconsider starting SGLT2.

## 2021-03-01 ENCOUNTER — Other Ambulatory Visit: Payer: Self-pay | Admitting: Family Medicine

## 2021-03-01 DIAGNOSIS — I10 Essential (primary) hypertension: Secondary | ICD-10-CM

## 2021-03-07 ENCOUNTER — Ambulatory Visit: Payer: Medicare Other | Admitting: Cardiology

## 2021-03-07 ENCOUNTER — Encounter: Payer: Self-pay | Admitting: Cardiology

## 2021-03-07 NOTE — Progress Notes (Deleted)
Cardiology Office Note  Date: 03/07/2021   ID: [name], Beed January 30, 1951, MRN [id number]  PCP:  Kathyrn Drown, MD  Cardiologist:  Rozann Lesches, MD Electrophysiologist:  None   No chief complaint on file.   History of Present Illness: Sabrina Deleon is a 70 y.o. female referred back to the office by Ms. [name] NP for follow-up of aortic stenosis.  She was last seen in 2019.  He underwent evaluation for severe aortic stenosis at that point, echocardiogram and cardiac catheterization results are noted below.  She was seen by Dr. Cyndia Bent in December 2019 for discussion of SAVR.  Per review of that note, she did not want to proceed at that point.  Past Medical History:  Diagnosis Date   Anxiety    Aortic stenosis    Arthritis    Chronic kidney disease    Dr. Tami Ribas- noted increase kidney function levels- took pt off Metformin and started on Onglyza for Blood sugar control-pt being followed for this.   GERD (gastroesophageal reflux disease)    Gout    Hypertension    Hypothyroidism    Morbid obesity ([employer]) 03/29/2018   Patient has elevated BMI with diabetes hyperlipidemia and hypertension   Pancreatitis    Type 2 diabetes mellitus ([place])    Wears glasses    Wears partial dentures    Upper    Past Surgical History:  Procedure Laterality Date   BACK SURGERY     Lumbar   CARPAL TUNNEL RELEASE  11/02/2011   Procedure: CARPAL TUNNEL RELEASE;  Surgeon: Tennis Must, MD;  Location: [facility];  Service: Orthopedics;  Laterality: Right;   CARPAL TUNNEL RELEASE Left 01/14/2015   Procedure: LEFT CARPAL TUNNEL RELEASE;  Surgeon: Leanora Cover, MD;  Location: [facility];  Service: Orthopedics;  Laterality: Left;   CHOLECYSTECTOMY     laparoscopic   COLONOSCOPY     COLONOSCOPY N/A 11/12/2015   Procedure: COLONOSCOPY;  Surgeon: Rogene Houston, MD;  [facility];  Service: Endoscopy;  Laterality: N/A;  9:30   ESOPHAGOGASTRODUODENOSCOPY  N/A 11/12/2015   Procedure: ESOPHAGOGASTRODUODENOSCOPY (EGD);  Surgeon: Rogene Houston, MD;  [facility];  Service: Endoscopy;  Laterality: N/A;   REVERSE SHOULDER ARTHROPLASTY Right 11/04/2018   REVERSE SHOULDER ARTHROPLASTY Right 11/04/2018   Procedure: REVERSE SHOULDER ARTHROPLASTY;  Surgeon: Verner Mould, MD;  Location: [facility];  Service: Orthopedics;  Laterality: Right;   RIGHT/LEFT HEART CATH AND CORONARY ANGIOGRAPHY N/A 10/18/2017   Procedure: RIGHT/LEFT HEART CATH AND CORONARY ANGIOGRAPHY;  Surgeon: Burnell Blanks, MD;  Location: [facility];  Service: Cardiovascular;  Laterality: N/A;   TOTAL HIP ARTHROPLASTY Left 03/22/2016   Procedure: LEFT TOTAL HIP ARTHROPLASTY ANTERIOR APPROACH;  Surgeon: Gaynelle Arabian, MD;  [facility];  Service: Orthopedics;  Laterality: Left;    Current Outpatient Medications  Medication Sig Dispense Refill   allopurinol (ZYLOPRIM) 100 MG tablet TAKE 1 TABLET(100 MG) BY MOUTH TWICE DAILY 180 tablet 1   aspirin EC 81 MG tablet Take 81 mg by mouth daily.     Cholecalciferol (VITAMIN D PO) Take 2,000 Units by mouth daily.     citalopram (CELEXA) 20 MG tablet TAKE 1 AND 1/2 TABLETS BY MOUTH EVERY DAY 135 tablet 1   colchicine 0.6 MG tablet TAKE 1 TABLET BY MOUTH TWICE DAILY AS NEEDED FOR GOUT FLAREUP (Patient not taking: Reported on 12/06/2020) 30 tablet 1   ferrous sulfate  325 (65 FE) MG tablet Take 325 mg by mouth daily with breakfast.     furosemide (LASIX) 40 MG tablet TAKE 1 TABLET(40 MG) BY MOUTH DAILY 90 tablet 1   HYDROcodone-acetaminophen (NORCO/VICODIN) 5-325 MG tablet Take one tab po bid prn pain 45 tablet 0   HYDROcodone-acetaminophen (NORCO/VICODIN) 5-325 MG tablet Take one tab po BID prn pain 45 tablet 0   HYDROcodone-acetaminophen (NORCO/VICODIN) 5-325 MG tablet Take one po BID prn pain 45 tablet 0   ketoconazole (NIZORAL) 2 % cream Apply BID to affected area prn 30 g 3   levothyroxine (SYNTHROID) 125 MCG tablet  TAKE 1 TABLET(125 MCG) BY MOUTH DAILY 90 tablet 0   lisinopril (ZESTRIL) 20 MG tablet TAKE 1 TABLET(20 MG) BY MOUTH DAILY 90 tablet 3   MISC NATURAL PRODUCTS PO Take 1 tablet by mouth daily. Beet Root Chewable     Multiple Vitamin (MULTIVITAMIN WITH MINERALS) TABS tablet Take 1 tablet by mouth daily.     ONETOUCH ULTRA test strip TEST ONCE DAILY 50 strip 5   pantoprazole (PROTONIX) 40 MG tablet TAKE 1 TABLET(40 MG) BY MOUTH DAILY (Patient taking differently: Take 40 mg by mouth every other day.) 90 tablet 1   rosuvastatin (CRESTOR) 10 MG tablet TAKE 1 TABLET(10 MG) BY MOUTH DAILY FOR CHOLESTEROL. STOP WHILE TAKING COLCHICINE FOR GOUT 90 tablet 1   triamcinolone cream (KENALOG) 0.1 % Apply 1 application topically 2 (two) times daily. Prn rash; use up to 2 weeks 30 g 0   zolpidem (AMBIEN) 5 MG tablet Take 1 tablet (5 mg total) by mouth at bedtime. 30 tablet 3   No current facility-administered medications for this visit.   Allergies:  Patient has no known allergies.   Social History: The patient  reports that she quit smoking about 39 years ago. Her smoking use included cigarettes. She has never used smokeless tobacco. She reports that she does not currently use alcohol. She reports that she does not use drugs.   Family History: The patient's family history is not on file.   ROS:  Please see the history of present illness. Otherwise, complete review of systems is positive for {NONE DEFAULTED:18576}.  All other systems are reviewed and negative.   Physical Exam: VS:  There were no vitals taken for this visit., BMI There is no height or weight on file to calculate BMI.  Wt Readings from Last 3 Encounters:  02/25/21 191 lb (86.6 kg)  11/19/20 199 lb (90.3 kg)  08/20/20 211 lb (95.7 kg)    General: Patient appears comfortable at rest. HEENT: Conjunctiva and lids normal, oropharynx clear with moist mucosa. Neck: Supple, no elevated JVP or carotid bruits, no thyromegaly. Lungs: Clear to  auscultation, nonlabored breathing at rest. Cardiac: Regular rate and rhythm, no S3 or significant systolic murmur, no pericardial rub. Abdomen: Soft, nontender, no hepatomegaly, bowel sounds present, no guarding or rebound. Extremities: No pitting edema, distal pulses 2+. Skin: Warm and dry. Musculoskeletal: No kyphosis. Neuropsychiatric: Alert and oriented x3, affect grossly appropriate.  ECG:  An ECG dated 10/18/2017 was personally reviewed today and demonstrated:  Sinus rhythm with leftward axis.  Recent Labwork: 11/23/2020: ALT 13; AST 30; BUN 18; Creatinine, Ser 1.15; Hemoglobin 10.5; Platelets 171; Sodium 138 02/25/2021: Potassium 4.7; TSH 0.618     Component Value Date/Time   CHOL 115 11/23/2020 1019   TRIG 111 11/23/2020 1019   HDL 40 11/23/2020 1019   CHOLHDL 2.9 11/23/2020 1019   LDLCALC 55 11/23/2020 1019  Other Studies Reviewed Today:  Echocardiogram 09/06/2017: - Left ventricle: The cavity size was normal. Wall thickness was    increased in a pattern of moderate LVH. Systolic function was    normal. The estimated ejection fraction was in the range of 60%    to 65%. Wall motion was normal; there were no regional wall    motion abnormalities. Doppler parameters are consistent with    abnormal left ventricular relaxation (grade 1 diastolic    dysfunction). Doppler parameters are consistent with high    ventricular filling pressure.  - Aortic valve: Severely calcified annulus. Trileaflet; severely    thickened leaflets. There was severe stenosis. There was mild    regurgitation. Mean gradient (S): 44 mm Hg. Valve area (VTI):    0.89 cm^2. Valve area (Vmax): 0.87 cm^2. Valve area (Vmean): 0.83    cm^2.  - Mitral valve: Severely calcified annulus. Moderately thickened    leaflets . The findings are consistent with mild stenosis. Valve    area by continuity equation (using LVOT flow): 1.71 cm^2.  - Left atrium: The atrium was mildly dilated.  - Technically difficult  study.   Cardiac catheterization 10/18/2017: Prox LAD lesion is 20% stenosed. Prox RCA to Mid RCA lesion is 20% stenosed. Hemodynamic findings consistent with mild pulmonary hypertension.   1. Mild non-obstructive CAD 2. Severe aortic stenosis with mean gradient of 26 mmHg, peak gradient 34 mmHg. (By echo the mean gradient is 74mmHg and the leaflets are thickened with poor mobility).   Carotid Dopplers 02/01/2018: Summary:  Right Carotid: Velocities in the right ICA are consistent with a 40-59%                 stenosis. A narrowing segment at proximal to mid, velocity                 elevated.   Left Carotid: Velocities in the left ICA are consistent with a 1-39%  stenosis.   Vertebrals: Bilateral vertebral arteries demonstrate antegrade flow.   Assessment and Plan:    Medication Adjustments/Labs and Tests Ordered: Current medicines are reviewed at length with the patient today.  Concerns regarding medicines are outlined above.   Tests Ordered: No orders of the defined types were placed in this encounter.   Medication Changes: No orders of the defined types were placed in this encounter.   Disposition:  Follow up {follow up:15908}  Signed, Satira Sark, MD, [facility] 03/07/2021 8:32 AM    [facility] [place], [place], [place] [address] Phone: [phone]; Fax: [phone]

## 2021-03-08 ENCOUNTER — Other Ambulatory Visit: Payer: Self-pay | Admitting: Family Medicine

## 2021-04-05 ENCOUNTER — Telehealth: Payer: Self-pay | Admitting: Family Medicine

## 2021-04-05 NOTE — Telephone Encounter (Signed)
Patient is requesting refill on hydrocodone called into [facility] has appointment 05/27/21. Pllease advise

## 2021-04-05 NOTE — Telephone Encounter (Signed)
[facility] has 2 pain scripts on file. Contact patient and informed her that pharmacy has scripts for her and she may call when ready for refill. Pt verbalized understanding.

## 2021-04-06 DIAGNOSIS — E113293 Type 2 diabetes mellitus with mild nonproliferative diabetic retinopathy without macular edema, bilateral: Secondary | ICD-10-CM | POA: Diagnosis not present

## 2021-04-06 LAB — HM DIABETES EYE EXAM

## 2021-04-07 ENCOUNTER — Other Ambulatory Visit: Payer: Self-pay | Admitting: Family Medicine

## 2021-04-18 ENCOUNTER — Telehealth: Payer: Self-pay | Admitting: *Deleted

## 2021-04-18 MED ORDER — AMOXICILLIN 500 MG PO TABS: ORAL_TABLET | ORAL | 0 refills | Status: DC

## 2021-04-18 MED ORDER — SUCRALFATE 1 G PO TABS: ORAL_TABLET | ORAL | 0 refills | Status: DC

## 2021-04-18 NOTE — Telephone Encounter (Signed)
Patient informed of md message and instructions. Verbalized understanding.

## 2021-04-18 NOTE — Telephone Encounter (Signed)
Patient has an abscess tooth, dentist gave PCN. Patient has an upset stomach, reflux, and burning. Patient stopped the medication Saturday and is a little better. Requesting rx for Carafate and different antibiotic that is easier on the stomach.

## 2021-04-18 NOTE — Telephone Encounter (Signed)
Carafate 1 g taken 3 times daily for the next 10 days She may have the liquid form but if liquid is not covered may utilize tablet and then just take that way  As for the antibiotic penicillin is the best choice Amoxicillin is typically gentle I do not recommend clindamycin because it can cause secondary diarrhea issues such as C. Difficile  So therefore amoxicillin 500 mg 1 taken 3 times daily for 10 days

## 2021-04-19 ENCOUNTER — Other Ambulatory Visit: Payer: Self-pay | Admitting: Family Medicine

## 2021-05-01 NOTE — Progress Notes (Signed)
Cardiology Office Note  Date: 05/02/2021   ID: Sabrina Deleon, Sabrina Deleon Sep 15, 1950, MRN [id number]  PCP:  Kathyrn Drown, MD  Cardiologist:  Rozann Lesches, MD Electrophysiologist:  None   Chief Complaint  Patient presents with   Follow-up aortic stenosis    History of Present Illness: Sabrina Deleon is a 71 y.o. female referred back to the office by Ms. [name] NP for follow-up of aortic stenosis.  She was last seen by cardiology back in 2019 at which point she underwent full work-up for severe aortic stenosis with recommendation to consider SAVR versus TAVR and referral to Dr. Cyndia Bent.  She was seen by Dr. Cyndia Bent in December 2019 with recommendation to pursue SAVR although not urgently, and at that point she decided to defer further evaluation.  She has had no regular cardiology follow-up since then.  She is here today with her husband.  She states that at current level of activity she does not report any unusual breathlessness beyond NYHA class II.  No exertional chest tightness, no palpitations or syncope.  I reviewed her medications which are noted below.  She states that she has lost a significant amount of weight since I last saw her through caloric restriction.  She does feel better.  I personally reviewed her ECG which shows sinus arrhythmia with LVH.  We discussed the progressive nature of aortic stenosis and arranging a follow-up echocardiogram.  Past Medical History:  Diagnosis Date   Anxiety    Aortic stenosis    Arthritis    Chronic kidney disease    Dr. Tami Ribas- noted increase kidney function levels- took pt off Metformin and started on Onglyza for Blood sugar control-pt being followed for this.   GERD (gastroesophageal reflux disease)    Gout    Hypertension    Hypothyroidism    Morbid obesity ([place]) 03/29/2018   Patient has elevated BMI with diabetes hyperlipidemia and hypertension   Pancreatitis    Type 2 diabetes mellitus ([place])    Wears glasses    Wears  partial dentures    Upper    Past Surgical History:  Procedure Laterality Date   BACK SURGERY     Lumbar   CARPAL TUNNEL RELEASE  11/02/2011   Procedure: CARPAL TUNNEL RELEASE;  Surgeon: Tennis Must, MD;  Location: [facility];  Service: Orthopedics;  Laterality: Right;   CARPAL TUNNEL RELEASE Left 01/14/2015   Procedure: LEFT CARPAL TUNNEL RELEASE;  Surgeon: Leanora Cover, MD;  Location: [facility];  Service: Orthopedics;  Laterality: Left;   CHOLECYSTECTOMY     laparoscopic   COLONOSCOPY     COLONOSCOPY N/A 11/12/2015   Procedure: COLONOSCOPY;  Surgeon: Rogene Houston, MD;  [facility];  Service: Endoscopy;  Laterality: N/A;  9:30   ESOPHAGOGASTRODUODENOSCOPY N/A 11/12/2015   Procedure: ESOPHAGOGASTRODUODENOSCOPY (EGD);  Surgeon: Rogene Houston, MD;  [facility];  Service: Endoscopy;  Laterality: N/A;   REVERSE SHOULDER ARTHROPLASTY Right 11/04/2018   REVERSE SHOULDER ARTHROPLASTY Right 11/04/2018   Procedure: REVERSE SHOULDER ARTHROPLASTY;  Surgeon: Verner Mould, MD;  Location: [facility];  Service: Orthopedics;  Laterality: Right;   RIGHT/LEFT HEART CATH AND CORONARY ANGIOGRAPHY N/A 10/18/2017   Procedure: RIGHT/LEFT HEART CATH AND CORONARY ANGIOGRAPHY;  Surgeon: Burnell Blanks, MD;  Location: [facility];  Service: Cardiovascular;  Laterality: N/A;   TOTAL HIP ARTHROPLASTY Left 03/22/2016   Procedure: LEFT TOTAL HIP ARTHROPLASTY ANTERIOR APPROACH;  Surgeon: Pilar Plate  Aluisio, MD;  [facility];  Service: Orthopedics;  Laterality: Left;    Current Outpatient Medications  Medication Sig Dispense Refill   allopurinol (ZYLOPRIM) 100 MG tablet TAKE 1 TABLET(100 MG) BY MOUTH TWICE DAILY 180 tablet 1   aspirin EC 81 MG tablet Take 81 mg by mouth daily.     Cholecalciferol (VITAMIN D PO) Take 2,000 Units by mouth daily.     citalopram (CELEXA) 20 MG tablet TAKE 1 AND 1/2 TABLETS BY MOUTH EVERY DAY 135 tablet 1    colchicine 0.6 MG tablet TAKE 1 TABLET BY MOUTH TWICE DAILY AS NEEDED FOR GOUT FLAREUP 30 tablet 1   ferrous sulfate 325 (65 FE) MG tablet Take 325 mg by mouth daily with breakfast.     furosemide (LASIX) 40 MG tablet TAKE 1 TABLET(40 MG) BY MOUTH DAILY 90 tablet 0   HYDROcodone-acetaminophen (NORCO/VICODIN) 5-325 MG tablet Take one tab po bid prn pain (Patient taking differently: Take 1 tablet by mouth 2 (two) times daily. Take one tab po bid prn pain) 45 tablet 0   HYDROcodone-acetaminophen (NORCO/VICODIN) 5-325 MG tablet Take one tab po BID prn pain 45 tablet 0   HYDROcodone-acetaminophen (NORCO/VICODIN) 5-325 MG tablet Take one po BID prn pain 45 tablet 0   ketoconazole (NIZORAL) 2 % cream Apply BID to affected area prn 30 g 3   levothyroxine (SYNTHROID) 137 MCG tablet Take 137 mcg by mouth daily.     MISC NATURAL PRODUCTS PO Take 1 tablet by mouth daily. Beet Root Chewable     Multiple Vitamin (MULTIVITAMIN WITH MINERALS) TABS tablet Take 1 tablet by mouth daily.     ONETOUCH ULTRA test strip TEST ONCE DAILY 50 strip 5   rosuvastatin (CRESTOR) 10 MG tablet TAKE 1 TABLET(10 MG) BY MOUTH DAILY FOR CHOLESTEROL. STOP WHILE TAKING COLCHICINE FOR GOUT 90 tablet 1   triamcinolone cream (KENALOG) 0.1 % Apply 1 application topically 2 (two) times daily. Prn rash; use up to 2 weeks 30 g 0   zolpidem (AMBIEN) 5 MG tablet Take 1 tablet (5 mg total) by mouth at bedtime. 30 tablet 3   lisinopril (ZESTRIL) 20 MG tablet TAKE 1 TABLET(20 MG) BY MOUTH DAILY (Patient taking differently: Take 10 mg by mouth daily.) 90 tablet 3   pantoprazole (PROTONIX) 40 MG tablet TAKE 1 TABLET(40 MG) BY MOUTH DAILY (Patient taking differently: 40 mg every other day.) 90 tablet 1   sucralfate (CARAFATE) 1 g tablet 1 tablet by mouth Three a day (Patient taking differently: Take 1 g by mouth as needed.) 30 tablet 0   No current facility-administered medications for this visit.   Allergies:  Patient has no known allergies.    Social History: The patient  reports that she quit smoking about 39 years ago. Her smoking use included cigarettes. She has never used smokeless tobacco. She reports that she does not currently use alcohol. She reports that she does not use drugs.   Family History: The patient's family history is not on file.   ROS: No palpitations or syncope.  Physical Exam: VS:  BP 138/72    Pulse 60    Ht 5' 8.5" (1.74 m)    Wt 186 lb 12.8 oz (84.7 kg)    SpO2 98%    BMI 27.99 kg/m , BMI Body mass index is 27.99 kg/m.  Wt Readings from Last 3 Encounters:  05/02/21 186 lb 12.8 oz (84.7 kg)  02/25/21 191 lb (86.6 kg)  11/19/20 199 lb (90.3 kg)  General: Patient appears comfortable at rest. HEENT: Conjunctiva and lids normal, wearing a mask. Neck: Supple, no elevated JVP, radiation of cardiac murmur to carotids, no thyromegaly. Lungs: Clear to auscultation, nonlabored breathing at rest. Cardiac: Regular rate and rhythm, no S3, 4/6 systolic murmur, no pericardial rub. Abdomen: Soft, nontender, bowel sounds present. Extremities: No pitting edema, distal pulses 2+. Skin: Warm and dry. Musculoskeletal: No kyphosis. Neuropsychiatric: Alert and oriented x3, affect grossly appropriate.  ECG:  An ECG dated 10/18/2017 was personally reviewed today and demonstrated:  Sinus rhythm with leftward axis.  Recent Labwork: 11/23/2020: ALT 13; AST 30; BUN 18; Creatinine, Ser 1.15; Hemoglobin 10.5; Platelets 171; Sodium 138 02/25/2021: Potassium 4.7; TSH 0.618     Component Value Date/Time   CHOL 115 11/23/2020 1019   TRIG 111 11/23/2020 1019   HDL 40 11/23/2020 1019   CHOLHDL 2.9 11/23/2020 1019   LDLCALC 55 11/23/2020 1019    Other Studies Reviewed Today:  Echocardiogram 11/06/2017: - Left ventricle: The cavity size was normal. Wall thickness was    increased in a pattern of moderate LVH. Systolic function was    normal. The estimated ejection fraction was in the range of 60%    to 65%. Wall motion was  normal; there were no regional wall    motion abnormalities. Doppler parameters are consistent with    abnormal left ventricular relaxation (grade 1 diastolic    dysfunction). Doppler parameters are consistent with high    ventricular filling pressure.  - Aortic valve: Severely calcified annulus. Trileaflet; severely    thickened leaflets. There was severe stenosis. There was mild    regurgitation. Mean gradient (S): 44 mm Hg. Valve area (VTI):    0.89 cm^2. Valve area (Vmax): 0.87 cm^2. Valve area (Vmean): 0.83    cm^2.  - Mitral valve: Severely calcified annulus. Moderately thickened    leaflets . The findings are consistent with mild stenosis. Valve    area by continuity equation (using LVOT flow): 1.71 cm^2.  - Left atrium: The atrium was mildly dilated.  - Technically difficult study.   Cardiac catheterization 10/18/2017: Prox LAD lesion is 20% stenosed. Prox RCA to Mid RCA lesion is 20% stenosed. Hemodynamic findings consistent with mild pulmonary hypertension.   1. Mild non-obstructive CAD 2. Severe aortic stenosis with mean gradient of 26 mmHg, peak gradient 34 mmHg. (By echo the mean gradient is 43mmHg and the leaflets are thickened with poor mobility).    Recommendations: She will need AVR. Her risk for surgical AVR seems to be low. I will make a referral to see Dr. Cyndia Bent or Dr. Roxy Manns in the [facility] office to evaluate for surgical AVR. We will discuss her case at our valve team meeting as there is a possibility of TAVR. Findings relayed to Dr. Domenic Polite.   Carotid Dopplers 02/01/2018: Summary:  Right Carotid: Velocities in the right ICA are consistent with a 40-59%                 stenosis. A narrowing segment at proximal to mid, velocity                 elevated.   Left Carotid: Velocities in the left ICA are consistent with a 1-39%  stenosis.   Vertebrals: Bilateral vertebral arteries demonstrate antegrade flow.   Assessment and Plan:  1.  Severe calcific aortic  stenosis based on evaluation in 2019.  She underwent full work-up at that point with discussion of SAVR by Dr. Cyndia Bent, in light of  clinical status at that point decision was for observation.  She presents now overdue for follow-up, describes NYHA class II dyspnea, no major functional imitations with current baseline activities, no angina or syncope.  We did discuss the progressive nature of aortic stenosis and timing of valve replacement.  Plan will be to obtain a follow-up echocardiogram, would plan clinical reassessment within 6 months, sooner if symptoms intervene.  In general she prefers to avoid surgery as long as possible.  2.  Mild nonobstructive CAD by cardiac catheterization in 2019.  She does not describe any angina and remains on low-dose aspirin along with Crestor.  3.  Asymptomatic mild to moderate carotid artery disease by carotid Dopplers in 2019.  She is on aspirin and statin.  Medication Adjustments/Labs and Tests Ordered: Current medicines are reviewed at length with the patient today.  Concerns regarding medicines are outlined above.   Tests Ordered: Orders Placed This Encounter  Procedures   EKG 12-Lead   ECHOCARDIOGRAM COMPLETE    Medication Changes: No orders of the defined types were placed in this encounter.   Disposition:  Follow up  6 months.  Signed, Satira Sark, MD, [facility] 05/02/2021 11:12 AM    [facility] [place], [place], [place] [address] Phone: [phone]; Fax: [phone]

## 2021-05-02 ENCOUNTER — Ambulatory Visit: Payer: Medicare Other | Admitting: Cardiology

## 2021-05-02 ENCOUNTER — Encounter: Payer: Self-pay | Admitting: Cardiology

## 2021-05-02 VITALS — BP 138/72 | HR 60 | Ht 68.5 in | Wt 186.8 lb

## 2021-05-02 DIAGNOSIS — I350 Nonrheumatic aortic (valve) stenosis: Secondary | ICD-10-CM | POA: Diagnosis not present

## 2021-05-02 DIAGNOSIS — I25119 Atherosclerotic heart disease of native coronary artery with unspecified angina pectoris: Secondary | ICD-10-CM

## 2021-05-02 DIAGNOSIS — I6523 Occlusion and stenosis of bilateral carotid arteries: Secondary | ICD-10-CM

## 2021-05-02 NOTE — Patient Instructions (Signed)
Medication Instructions:  Your physician recommends that you continue on your current medications as directed. Please refer to the Current Medication list given to you today.   Labwork: None today  Testing/Procedures: Your physician has requested that you have an echocardiogram. Echocardiography is a painless test that uses sound waves to create images of your heart. It provides your doctor with information about the size and shape of your heart and how well your heart's chambers and valves are working. This procedure takes approximately one hour. There are no restrictions for this procedure.   Follow-Up: 6 months  Any Other Special Instructions Will Be Listed Below (If Applicable).  If you need a refill on your cardiac medications before your next appointment, please call your pharmacy.  

## 2021-05-10 ENCOUNTER — Ambulatory Visit (INDEPENDENT_AMBULATORY_CARE_PROVIDER_SITE_OTHER): Payer: Medicare Other

## 2021-05-10 DIAGNOSIS — I350 Nonrheumatic aortic (valve) stenosis: Secondary | ICD-10-CM | POA: Diagnosis not present

## 2021-05-10 LAB — ECHOCARDIOGRAM COMPLETE
AR max vel: 0.93 cm2
AV Area VTI: 0.78 cm2
AV Area mean vel: 0.8 cm2
AV Mean grad: 44 mmHg
AV Peak grad: 67.8 mmHg
AV Vena cont: 0.78 cm
Ao pk vel: 4.12 m/s
Area-P 1/2: 2.33 cm2
MV M vel: 6.38 m/s
MV Peak grad: 162.8 mmHg
MV VTI: 1.38 cm2
P 1/2 time: 315 msec
Radius: 0.7 cm
S' Lateral: 4.15 cm

## 2021-05-13 ENCOUNTER — Other Ambulatory Visit: Payer: Self-pay | Admitting: *Deleted

## 2021-05-13 DIAGNOSIS — I350 Nonrheumatic aortic (valve) stenosis: Secondary | ICD-10-CM

## 2021-05-24 ENCOUNTER — Other Ambulatory Visit: Payer: Self-pay | Admitting: Family Medicine

## 2021-05-27 ENCOUNTER — Telehealth: Payer: Self-pay | Admitting: *Deleted

## 2021-05-27 ENCOUNTER — Other Ambulatory Visit: Payer: Self-pay | Admitting: Family Medicine

## 2021-05-27 ENCOUNTER — Ambulatory Visit (INDEPENDENT_AMBULATORY_CARE_PROVIDER_SITE_OTHER): Payer: Medicare Other | Admitting: Nurse Practitioner

## 2021-05-27 ENCOUNTER — Other Ambulatory Visit: Payer: Self-pay

## 2021-05-27 VITALS — BP 148/71 | HR 61 | Temp 98.6°F | Ht 68.5 in | Wt 177.0 lb

## 2021-05-27 DIAGNOSIS — K219 Gastro-esophageal reflux disease without esophagitis: Secondary | ICD-10-CM | POA: Diagnosis not present

## 2021-05-27 DIAGNOSIS — E785 Hyperlipidemia, unspecified: Secondary | ICD-10-CM | POA: Diagnosis not present

## 2021-05-27 DIAGNOSIS — Z79899 Other long term (current) drug therapy: Secondary | ICD-10-CM

## 2021-05-27 DIAGNOSIS — N1832 Chronic kidney disease, stage 3b: Secondary | ICD-10-CM | POA: Diagnosis not present

## 2021-05-27 DIAGNOSIS — E039 Hypothyroidism, unspecified: Secondary | ICD-10-CM

## 2021-05-27 DIAGNOSIS — I10 Essential (primary) hypertension: Secondary | ICD-10-CM | POA: Diagnosis not present

## 2021-05-27 DIAGNOSIS — E1122 Type 2 diabetes mellitus with diabetic chronic kidney disease: Secondary | ICD-10-CM | POA: Diagnosis not present

## 2021-05-27 DIAGNOSIS — D509 Iron deficiency anemia, unspecified: Secondary | ICD-10-CM

## 2021-05-27 NOTE — Progress Notes (Signed)
Subjective:    Patient ID: Sabrina Deleon, female    DOB: February 09, 1951, 71 y.o.   MRN: [id number]  HPI 3 month follow up chronic medical concerns, type 2 DM, hypothyroidism. Blood pressure doing fine on reduced dose of lisinopril 10 mg daily. Blood sugar record indicates her fasting sugar is averaging in the low 100s with only 2 measurements noted at 237 and 230.  Patient relates this to something she ate the night before. Has noticed an increase in her reflux symptoms lately.  Now taking her pantoprazole a few times a week, not every day.  Describes recurrence of her boring pain going from the epigastric area straight through to her back at times.  Continues to have chronic diarrhea.  Her husband is present today.  Has noticed at least 1 black stool.  Cannot remember if she took any products such as Pepto-Bismol that may have caused this.  Has a history of anemia, is due for her CBC. This patient was seen today for chronic pain  The medication list was reviewed and updated.  Location of Pain for which the patient has been treated with regarding narcotics: Multiple joints including hip and shoulder from previous surgeries and osteoarthritis  Onset of this pain: Chronic   -Compliance with medication: Yes  - Number patient states they take daily: Usually a half tab p.o. twice daily if needed but has increased this by half a pill a day lately due to some recent dental work.  -when was the last dose patient took?  Within the past 24 hours  The patient was advised the importance of maintaining medication and not using illegal substances with these.  Here for refills and follow up  The patient was educated that we can provide 3 monthly scripts for their medication, it is their responsibility to follow the instructions.  Side effects or complications from medications: None  Patient is aware that pain medications are meant to minimize the severity of the pain to allow their pain levels to  improve to allow for better function. They are aware of that pain medications cannot totally remove their pain.  Due for UDT ( at least once per year) : 12/26/2019  Pain medication allows patient to get her pain down to a reasonable level so she can perform ADLs. Patient also concerned today about some memory issues that been going on for the past 2 years.  It began after she retired.  Having trouble focusing with what she terms some short-term memory loss. Depression screen [facility] 2/9 11/19/2020  Decreased Interest 0  Down, Depressed, Hopeless 0  PHQ - 2 Score 0  Altered sleeping -  Tired, decreased energy -  Change in appetite -  Feeling bad or failure about yourself  -  Trouble concentrating -  Moving slowly or fidgety/restless -  Suicidal thoughts -  PHQ-9 Score -  Difficult doing work/chores -  Some recent data might be hidden     Review of Systems  Constitutional:  Positive for fatigue.  HENT:  Negative for sore throat and trouble swallowing.   Respiratory:  Negative for cough and chest tightness.        No unusual SOB. Had recent follow up with cardiology for aortic stenosis.   Cardiovascular:  Negative for chest pain and leg swelling.  Gastrointestinal:  Positive for abdominal pain, diarrhea and nausea. Negative for vomiting.  Musculoskeletal:        Chronic multiple joint pain.       Objective:  Physical Exam Constitutional:      General: She is not in acute distress.    Appearance: She is well-developed.  Neck:     Thyroid: No thyromegaly.     Trachea: No tracheal deviation.     Comments: Thyroid non tender to palpation. No mass or goiter noted.  Cardiovascular:     Rate and Rhythm: Normal rate and regular rhythm.     Heart sounds: Murmur heard.  Pulmonary:     Effort: Pulmonary effort is normal. No respiratory distress.     Breath sounds: Normal breath sounds.  Chest:  Breasts:    Right: No swelling, inverted nipple, mass, skin change or tenderness.     Left:  No swelling, inverted nipple, mass, skin change or tenderness.  Abdominal:     General: There is no distension.     Palpations: Abdomen is soft.     Tenderness: There is abdominal tenderness. There is guarding.     Comments: Abdomen soft nondistended with distinct significant epigastric area tenderness.  Guarding noted with deep palpation.  No obvious masses.  Abdominal exam otherwise benign.  Musculoskeletal:     Cervical back: Normal range of motion and neck supple.     Right lower leg: No edema.     Left lower leg: No edema.  Lymphadenopathy:     Cervical: No cervical adenopathy.     Upper Body:     Right upper body: No supraclavicular, axillary or pectoral adenopathy.     Left upper body: No supraclavicular, axillary or pectoral adenopathy.  Skin:    General: Skin is warm and dry.  Neurological:     Mental Status: She is alert and oriented to person, place, and time.  Psychiatric:        Mood and Affect: Mood normal.        Behavior: Behavior normal.        Thought Content: Thought content normal.        Judgment: Judgment normal.    MMSE - Mini Mental State Exam 05/27/2021  Orientation to time 5  Orientation to Place 5  Registration 3  Attention/ Calculation 5  Recall 3  Language- name 2 objects 2  Language- repeat 1  Language- follow 3 step command 3  Language- read & follow direction 1  Write a sentence 1  Copy design 1  Total score 30   Today's Vitals   05/27/21 1052  BP: (!) 148/71  Pulse: 61  Temp: 98.6 F (37 C)  SpO2: 97%  Weight: 177 lb (80.3 kg)  Height: 5' 8.5" (1.74 m)   Body mass index is 26.52 kg/m. Has lost 9 pounds since her last visit.       Assessment & Plan:   Problem List Items Addressed This Visit       Cardiovascular and Mediastinum   Hypertension - Primary   Relevant Orders   CBC with Differential/Platelet   Comprehensive metabolic panel   Lipid panel   TSH   Hemoglobin A1c     Digestive   Gastroesophageal reflux  disease without esophagitis     Endocrine   Hypothyroidism   Relevant Medications   levothyroxine (SYNTHROID) 125 MCG tablet   Other Relevant Orders   CBC with Differential/Platelet   Comprehensive metabolic panel   Lipid panel   TSH   Hemoglobin A1c   Type 2 diabetes mellitus (HCC)   Relevant Orders   CBC with Differential/Platelet   Comprehensive metabolic panel   Lipid panel  TSH   Hemoglobin A1c     Genitourinary   Chronic kidney disease (CKD) stage G3b/A1, moderately decreased glomerular filtration rate (GFR) between 30-44 mL/min/1.73 square meter and albuminuria creatinine ratio less than 30 mg/g (HCC)   Relevant Orders   CBC with Differential/Platelet   Comprehensive metabolic panel   Lipid panel   TSH   Hemoglobin A1c     Other   Hyperlipidemia   Relevant Orders   CBC with Differential/Platelet   Comprehensive metabolic panel   Lipid panel   TSH   Hemoglobin A1c   Iron deficiency anemia   Relevant Orders   CBC with Differential/Platelet   Comprehensive metabolic panel   Lipid panel   TSH   Hemoglobin A1c   Other Visit Diagnoses     High risk medication use       Relevant Orders   CBC with Differential/Platelet   Comprehensive metabolic panel   Lipid panel   TSH   Hemoglobin A1c      Meds ordered this encounter  Medications   HYDROcodone-acetaminophen (NORCO/VICODIN) 5-325 MG tablet    Sig: Take one tab po BID prn pain    Dispense:  45 tablet    Refill:  0    May fill 30 days from 05/18/21    Order Specific Question:   Supervising Provider    Answer:   Sallee Lange A [[id number]]   HYDROcodone-acetaminophen (NORCO/VICODIN) 5-325 MG tablet    Sig: Take one po BID prn pain    Dispense:  45 tablet    Refill:  0    May fill 60 days from 05/18/21    Order Specific Question:   Supervising Provider    Answer:   Sallee Lange A [[id number]]   HYDROcodone-acetaminophen (NORCO/VICODIN) 5-325 MG tablet    Sig: Take one tab po BID prn pain    Dispense:  45  tablet    Refill:  0    May fill 90 days from 05/18/21    Order Specific Question:   Supervising Provider    Answer:   Sallee Lange A [[id number]]     Continue current medications as directed.  Continue to monitor and record blood sugars and bring log to next visit. Patient is due for a UDS.  We will send a note to nursing to get this ordered. Other labs pending. Encourage patient to start activities to help her memory and focus such as luminosity or other games. Start taking pantoprazole daily.  Reviewed lifestyle measures affecting reflux symptoms. Patient to contact office in 2 weeks if pain is not resolved.  Agrees with this plan.  Recommend shingles vaccine at local pharmacy. Return in about 3 months (around 08/27/2021).

## 2021-05-27 NOTE — Patient Instructions (Signed)
Start Pantoprazole once a day ?Contact office in 2 weeks if pain is not resolved ?

## 2021-05-28 ENCOUNTER — Encounter: Payer: Self-pay | Admitting: Nurse Practitioner

## 2021-05-28 ENCOUNTER — Other Ambulatory Visit: Payer: Self-pay | Admitting: Nurse Practitioner

## 2021-05-28 DIAGNOSIS — K219 Gastro-esophageal reflux disease without esophagitis: Secondary | ICD-10-CM | POA: Insufficient documentation

## 2021-05-28 MED ORDER — HYDROCODONE-ACETAMINOPHEN 5-325 MG PO TABS
ORAL_TABLET | ORAL | 0 refills | Status: DC
Start: 2021-05-28 — End: 2021-08-29

## 2021-05-28 MED ORDER — HYDROCODONE-ACETAMINOPHEN 5-325 MG PO TABS: ORAL_TABLET | ORAL | 0 refills | Status: DC

## 2021-05-30 ENCOUNTER — Other Ambulatory Visit: Payer: Self-pay | Admitting: Family Medicine

## 2021-05-30 ENCOUNTER — Telehealth: Payer: Self-pay | Admitting: Family Medicine

## 2021-05-30 DIAGNOSIS — E1122 Type 2 diabetes mellitus with diabetic chronic kidney disease: Secondary | ICD-10-CM | POA: Diagnosis not present

## 2021-05-30 DIAGNOSIS — I10 Essential (primary) hypertension: Secondary | ICD-10-CM | POA: Diagnosis not present

## 2021-05-30 DIAGNOSIS — N1832 Chronic kidney disease, stage 3b: Secondary | ICD-10-CM | POA: Diagnosis not present

## 2021-05-30 DIAGNOSIS — E785 Hyperlipidemia, unspecified: Secondary | ICD-10-CM | POA: Diagnosis not present

## 2021-05-30 DIAGNOSIS — E039 Hypothyroidism, unspecified: Secondary | ICD-10-CM | POA: Diagnosis not present

## 2021-05-30 MED ORDER — ZOLPIDEM TARTRATE 5 MG PO TABS: 5.0000 mg | ORAL_TABLET | Freq: Every day | ORAL | 3 refills | Status: DC

## 2021-05-30 NOTE — Telephone Encounter (Signed)
Please call pharmacy make sure they know that somehow we received a request for glipizide to make sure that this is not on their formulary list for her.  Thank you ?

## 2021-05-30 NOTE — Telephone Encounter (Signed)
Pharmacy sent in a request for Medication ?zolpidem (AMBIEN) 5 MG tablet [[id number]] ?zolpidem (AMBIEN) 5 MG tablet [[id number]]  ?  Order Details ?Dose: 5 mg Route: Oral Frequency: Daily at bedtime  ?Dispense Quantity: 30 tablet Refills: 3   ?     ?Sig: Take 1 tablet (5 mg total) by mouth at bedtime.  ?  ? ?

## 2021-05-30 NOTE — Telephone Encounter (Signed)
Please check with patient what type of herbal medicines or OTC measures are she/is she taking because this could interfere with Celexa ?

## 2021-05-30 NOTE — Telephone Encounter (Signed)
Also got a fax for glipizide 5 mg tablets take 1/2 tablet by mouth twice a day ?

## 2021-05-30 NOTE — Telephone Encounter (Signed)
Nurses ?I did send in refills of her Ambien ?As for this glipizide this is not on her med list.  Therefore I question its legitimacy. ?Please touch base with patient please explain to her we are receiving a request for glipizide but do not have her on this medication see if she has an explanation or not?  Let me know thanks ?

## 2021-05-30 NOTE — Telephone Encounter (Signed)
Patient takes a beet root chewable that gives her energy.  ?

## 2021-05-31 ENCOUNTER — Other Ambulatory Visit: Payer: Self-pay | Admitting: *Deleted

## 2021-05-31 DIAGNOSIS — Z79899 Other long term (current) drug therapy: Secondary | ICD-10-CM

## 2021-05-31 DIAGNOSIS — D509 Iron deficiency anemia, unspecified: Secondary | ICD-10-CM

## 2021-05-31 DIAGNOSIS — E876 Hypokalemia: Secondary | ICD-10-CM

## 2021-05-31 LAB — CBC WITH DIFFERENTIAL/PLATELET
Basophils Absolute: 0 10*3/uL (ref 0.0–0.2)
Basos: 1 %
EOS (ABSOLUTE): 0.4 10*3/uL (ref 0.0–0.4)
Eos: 7 %
Hematocrit: 31.1 % — ABNORMAL LOW (ref 34.0–46.6)
Hemoglobin: 10.1 g/dL — ABNORMAL LOW (ref 11.1–15.9)
Immature Grans (Abs): 0 10*3/uL (ref 0.0–0.1)
Immature Granulocytes: 0 %
Lymphocytes Absolute: 1.2 10*3/uL (ref 0.7–3.1)
Lymphs: 20 %
MCH: 28.4 pg (ref 26.6–33.0)
MCHC: 32.5 g/dL (ref 31.5–35.7)
MCV: 87 fL (ref 79–97)
Monocytes Absolute: 0.4 10*3/uL (ref 0.1–0.9)
Monocytes: 7 %
Neutrophils Absolute: 3.8 10*3/uL (ref 1.4–7.0)
Neutrophils: 65 %
Platelets: 211 10*3/uL (ref 150–450)
RBC: 3.56 x10E6/uL — ABNORMAL LOW (ref 3.77–5.28)
RDW: 13.7 % (ref 11.7–15.4)
WBC: 5.9 10*3/uL (ref 3.4–10.8)

## 2021-05-31 LAB — COMPREHENSIVE METABOLIC PANEL
ALT: 11 IU/L (ref 0–32)
AST: 31 IU/L (ref 0–40)
Albumin/Globulin Ratio: 1.8 (ref 1.2–2.2)
Albumin: 4.5 g/dL (ref 3.8–4.8)
Alkaline Phosphatase: 80 IU/L (ref 44–121)
BUN/Creatinine Ratio: 14 (ref 12–28)
BUN: 15 mg/dL (ref 8–27)
Bilirubin Total: 0.4 mg/dL (ref 0.0–1.2)
CO2: 25 mmol/L (ref 20–29)
Calcium: 9.8 mg/dL (ref 8.7–10.3)
Chloride: 102 mmol/L (ref 96–106)
Creatinine, Ser: 1.08 mg/dL — ABNORMAL HIGH (ref 0.57–1.00)
Globulin, Total: 2.5 g/dL (ref 1.5–4.5)
Glucose: 116 mg/dL — ABNORMAL HIGH (ref 70–99)
Potassium: 3.4 mmol/L — ABNORMAL LOW (ref 3.5–5.2)
Sodium: 142 mmol/L (ref 134–144)
Total Protein: 7 g/dL (ref 6.0–8.5)
eGFR: 56 mL/min/{1.73_m2} — ABNORMAL LOW (ref >59)

## 2021-05-31 LAB — HEMOGLOBIN A1C
Est. average glucose Bld gHb Est-mCnc: 126 mg/dL
Hgb A1c MFr Bld: 6 % — ABNORMAL HIGH (ref 4.8–5.6)

## 2021-05-31 LAB — LIPID PANEL
Chol/HDL Ratio: 2.5 ratio (ref 0.0–4.4)
Cholesterol, Total: 111 mg/dL (ref 100–199)
HDL: 44 mg/dL (ref >39)
LDL Chol Calc (NIH): 52 mg/dL (ref 0–99)
Triglycerides: 72 mg/dL (ref 0–149)
VLDL Cholesterol Cal: 15 mg/dL (ref 5–40)

## 2021-05-31 LAB — TSH: TSH: 0.652 u[IU]/mL (ref 0.450–4.500)

## 2021-05-31 MED ORDER — POTASSIUM CHLORIDE CRYS ER 10 MEQ PO TBCR: EXTENDED_RELEASE_TABLET | ORAL | 6 refills | Status: DC

## 2021-05-31 MED ORDER — LEVOTHYROXINE SODIUM 125 MCG PO TABS: 125.0000 ug | ORAL_TABLET | Freq: Every day | ORAL | 1 refills | Status: DC

## 2021-05-31 NOTE — Telephone Encounter (Signed)
Called pharmacy, the glipizide must have been on automatic refill. Medication was removed.  ?

## 2021-06-06 NOTE — Telephone Encounter (Signed)
Refill sent to pharmacy with correct dosage.  ?

## 2021-06-10 ENCOUNTER — Other Ambulatory Visit: Payer: Self-pay

## 2021-06-10 DIAGNOSIS — D509 Iron deficiency anemia, unspecified: Secondary | ICD-10-CM

## 2021-06-10 LAB — IFOBT (OCCULT BLOOD): IFOBT: POSITIVE

## 2021-06-17 ENCOUNTER — Other Ambulatory Visit: Payer: Self-pay | Admitting: Family Medicine

## 2021-06-17 DIAGNOSIS — R195 Other fecal abnormalities: Secondary | ICD-10-CM

## 2021-06-23 ENCOUNTER — Encounter (INDEPENDENT_AMBULATORY_CARE_PROVIDER_SITE_OTHER): Payer: Self-pay | Admitting: *Deleted

## 2021-06-25 ENCOUNTER — Other Ambulatory Visit: Payer: Self-pay | Admitting: Family Medicine

## 2021-08-17 ENCOUNTER — Other Ambulatory Visit: Payer: Self-pay | Admitting: Family Medicine

## 2021-08-29 ENCOUNTER — Ambulatory Visit (INDEPENDENT_AMBULATORY_CARE_PROVIDER_SITE_OTHER): Payer: Medicare Other | Admitting: Gastroenterology

## 2021-08-29 ENCOUNTER — Encounter (INDEPENDENT_AMBULATORY_CARE_PROVIDER_SITE_OTHER): Payer: Self-pay | Admitting: Gastroenterology

## 2021-08-29 VITALS — BP 118/65 | HR 56 | Temp 98.1°F | Ht 68.5 in | Wt 175.1 lb

## 2021-08-29 DIAGNOSIS — K529 Noninfective gastroenteritis and colitis, unspecified: Secondary | ICD-10-CM

## 2021-08-29 DIAGNOSIS — I350 Nonrheumatic aortic (valve) stenosis: Secondary | ICD-10-CM

## 2021-08-29 DIAGNOSIS — D649 Anemia, unspecified: Secondary | ICD-10-CM | POA: Insufficient documentation

## 2021-08-29 MED ORDER — COLESTIPOL HCL 1 G PO TABS: 2.0000 g | ORAL_TABLET | Freq: Every day | ORAL | 1 refills | Status: DC

## 2021-08-29 NOTE — Progress Notes (Signed)
Maylon Peppers, M.D. Gastroenterology & Hepatology [facility] [address] [place], [place] [address] Primary Care Physician: Kathyrn Drown, MD [name] [address]  Referring MD: PCP  Chief Complaint: Positive fecal occult blood test  History of Present Illness: Sabrina Deleon is a 71 y.o. female with Pmh severe aortic stenosis, anxiety, arthritis, hypertension, hypothyroidism, diabetes, pancreatitis, GERD, gout, who presents for evaluation of positive fecal occult blood test.  Patient reports that she had her Guaiac stool testing checked at her PCP's office which came back positive.  States that she had very occasional blood when wiping but has not presented any recent episodes. Denies melena.  She relates that due to this her stool was checked as she was supposed to have a repeat colonoscopy 10 years from her last procedure.  She states that for at least the last 3 years she has been presenting presenting some loose stools and urgency to have a BM. She states that she was having chronically watery Bms but more recently has had more consistency - however she can have 3-8 Bms per day on average.  Had a cholecystectomy >10 years ago. Does not follow any diet.  Has never had any evaluation for this.  States that she was having some upper abdominal pain intermittently but not frequently.  The patient denies having any nausea, vomiting, fever, chills, hematemesis, abdominal distention, abdominal pain, jaundice, pruritus. Patient lost 110 lb after she had her hip replaced, but has not presented any more weight loss since then -she is not concerned about having this weight loss as it has been beneficial for her comorbidities.  Most recent labs from 05/30/21 showed Hb 10.1 MCV 87 normal cell lines otherwise, CMP with K 3.4 Cr 1.08, rest of LFT WNL. No recent iron stores.  Patent takes iron daily.  Notably, she has  severe aortic stenosis but no symptoms. She was advised to reconsult with cardiology if she developed symptoms and decision to proceed with TAVR was postponed.   Last EGD:2017 Normal esophagus. - Z-line irregular, 39 cm from the incisors. - Multiple gastric polyps. Biopsied. - Normal duodenal bulb and second portion of the duodenum.  Last Colonoscopy:2017 The sigmoid colon and hepatic flexure were tortuous. The colon (entire examined portion) appeared normal. External hemorrhoids were found during retroflexion. The hemorrhoids were small.  FHx: neg for any gastrointestinal/liver disease, mother breast and lung cancer Social: neg smoking, alcohol or illicit drug use Surgical: no abdominal surgeries  Past Medical History: Past Medical History:  Diagnosis Date   Anxiety    Aortic stenosis    Arthritis    Chronic kidney disease    Dr. Tami Ribas- noted increase kidney function levels- took pt off Metformin and started on Onglyza for Blood sugar control-pt being followed for this.   GERD (gastroesophageal reflux disease)    Gout    Hypertension    Hypothyroidism    Morbid obesity ([place]) 03/29/2018   Patient has elevated BMI with diabetes hyperlipidemia and hypertension   Pancreatitis    Type 2 diabetes mellitus ([place])    Wears glasses    Wears partial dentures    Upper    Past Surgical History: Past Surgical History:  Procedure Laterality Date   BACK SURGERY     Lumbar   CARPAL TUNNEL RELEASE  11/02/2011   Procedure: CARPAL TUNNEL RELEASE;  Surgeon: Tennis Must, MD;  Location: [place];  Service: Orthopedics;  Laterality:  Right;   CARPAL TUNNEL RELEASE Left 01/14/2015   Procedure: LEFT CARPAL TUNNEL RELEASE;  Surgeon: Leanora Cover, MD;  Location: [place];  Service: Orthopedics;  Laterality: Left;   CHOLECYSTECTOMY     laparoscopic   COLONOSCOPY     COLONOSCOPY N/A 11/12/2015   Procedure: COLONOSCOPY;  Surgeon: Rogene Houston, MD;   [facility];  Service: Endoscopy;  Laterality: N/A;  9:30   ESOPHAGOGASTRODUODENOSCOPY N/A 11/12/2015   Procedure: ESOPHAGOGASTRODUODENOSCOPY (EGD);  Surgeon: Rogene Houston, MD;  [facility];  Service: Endoscopy;  Laterality: N/A;   REVERSE SHOULDER ARTHROPLASTY Right 11/04/2018   REVERSE SHOULDER ARTHROPLASTY Right 11/04/2018   Procedure: REVERSE SHOULDER ARTHROPLASTY;  Surgeon: Verner Mould, MD;  Location: [place];  Service: Orthopedics;  Laterality: Right;   RIGHT/LEFT HEART CATH AND CORONARY ANGIOGRAPHY N/A 10/18/2017   Procedure: RIGHT/LEFT HEART CATH AND CORONARY ANGIOGRAPHY;  Surgeon: Burnell Blanks, MD;  Location: [facility];  Service: Cardiovascular;  Laterality: N/A;   TOTAL HIP ARTHROPLASTY Left 03/22/2016   Procedure: LEFT TOTAL HIP ARTHROPLASTY ANTERIOR APPROACH;  Surgeon: Gaynelle Arabian, MD;  Location: [facility];  Service: Orthopedics;  Laterality: Left;    Family History:History reviewed. No pertinent family history.  Social History: Social History   Tobacco Use  Smoking Status Former   Types: Cigarettes   Quit date: 10/29/1981   Years since quitting: 39.8  Smokeless Tobacco Never   Social History   Substance and Sexual Activity  Alcohol Use Yes   Comment: occasionally   Social History   Substance and Sexual Activity  Drug Use No    Allergies: No Known Allergies  Medications: Current Outpatient Medications  Medication Sig Dispense Refill   allopurinol (ZYLOPRIM) 100 MG tablet TAKE 1 TABLET(100 MG) BY MOUTH TWICE DAILY 180 tablet 1   aspirin EC 81 MG tablet Take 81 mg by mouth daily.     Cholecalciferol (VITAMIN D PO) Take 2,000 Units by mouth daily.     citalopram (CELEXA) 20 MG tablet TAKE 1 AND 1/2 TABLETS BY MOUTH EVERY DAY 135 tablet 1   colchicine 0.6 MG tablet Take 0.6 mg by mouth 2 (two) times daily. prn     ferrous sulfate 325 (65 FE) MG tablet Take 325 mg by mouth daily with breakfast.     furosemide  (LASIX) 40 MG tablet TAKE 1 TABLET(40 MG) BY MOUTH DAILY 90 tablet 1   HYDROcodone-acetaminophen (NORCO/VICODIN) 5-325 MG tablet Take one tab po BID prn pain 45 tablet 0   ketoconazole (NIZORAL) 2 % cream Apply 1 application  topically 2 (two) times daily.     levothyroxine (SYNTHROID) 125 MCG tablet Take 1 tablet (125 mcg total) by mouth daily before breakfast. 90 tablet 1   lisinopril (ZESTRIL) 20 MG tablet TAKE 1 TABLET(20 MG) BY MOUTH DAILY (Patient taking differently: Take 10 mg by mouth daily.) 90 tablet 3   MISC NATURAL PRODUCTS PO Take 1 tablet by mouth daily. Beet Root Chewable     Multiple Vitamin (MULTIVITAMIN WITH MINERALS) TABS tablet Take 1 tablet by mouth daily.     ONETOUCH ULTRA test strip TEST ONCE DAILY 50 strip 5   pantoprazole (PROTONIX) 40 MG tablet TAKE 1 TABLET(40 MG) BY MOUTH DAILY (Patient taking differently: 40 mg every other day.) 90 tablet 1   potassium chloride (KLOR-CON M) 10 MEQ tablet Take 1 tablet by mouth on Monday, Wednesday and Friday 14 tablet 6   rosuvastatin (CRESTOR) 10 MG tablet  TAKE 1 TABLET(10 MG) BY MOUTH DAILY FOR CHOLESTEROL. STOP WHILE TAKING COLCHICINE FOR GOUT 90 tablet 1   triamcinolone cream (KENALOG) 0.1 % Apply 1 application  topically 2 (two) times daily. prn     zolpidem (AMBIEN) 5 MG tablet Take 1 tablet (5 mg total) by mouth at bedtime. 30 tablet 3   No current facility-administered medications for this visit.    Review of Systems: GENERAL: negative for malaise, night sweats HEENT: No changes in hearing or vision, no nose bleeds or other nasal problems. NECK: Negative for lumps, goiter, pain and significant neck swelling RESPIRATORY: Negative for cough, wheezing CARDIOVASCULAR: Negative for chest pain, leg swelling, palpitations, orthopnea GI: SEE HPI MUSCULOSKELETAL: Negative for joint pain or swelling, back pain, and muscle pain. SKIN: Negative for lesions, rash PSYCH: Negative for sleep disturbance, mood disorder and recent  psychosocial stressors. HEMATOLOGY Negative for prolonged bleeding, bruising easily, and swollen nodes. ENDOCRINE: Negative for cold or heat intolerance, polyuria, polydipsia and goiter. NEURO: negative for tremor, gait imbalance, syncope and seizures. The remainder of the review of systems is noncontributory.   Physical Exam: BP 118/65 (BP Location: Right Arm, Patient Position: Sitting, Cuff Size: Large)   Pulse (!) 56   Temp 98.1 F (36.7 C) (Oral)   Ht 5' 8.5" (1.74 m)   Wt 175 lb 1.6 oz (79.4 kg)   BMI 26.24 kg/m  GENERAL: The patient is AO x3, in no acute distress. HEENT: Head is normocephalic and atraumatic. EOMI are intact. Mouth is well hydrated and without lesions. NECK: Supple. No masses LUNGS: Clear to auscultation. No presence of rhonchi/wheezing/rales. Adequate chest expansion HEART: RRR, normal s1 and s2. ABDOMEN: Soft, nontender, no guarding, no peritoneal signs, and nondistended. BS +. No masses. EXTREMITIES: Without any cyanosis, clubbing, rash, lesions or edema. NEUROLOGIC: AOx3, no focal motor deficit. SKIN: no jaundice, no rashes   Imaging/Labs: as above  I personally reviewed and interpreted the available labs, imaging and endoscopic files.  Impression and Plan: Sabrina Deleon is a 71 y.o. female with Pmh severe aortic stenosis, anxiety, arthritis, hypertension, hypothyroidism, diabetes, pancreatitis, GERD, gout, who presents for evaluation of positive fecal occult blood test.    The patient has presented a chronic history of chronic diarrhea for the last 2 years which have not been evaluated in the past.  She has not presented any red flag signs recently.  We will evaluate her symptoms further with celiac disease serology and stool testing for pancreatic etiologies for now.  It is possible that her cholecystectomy has aggravated her diarrhea even further, for which she will be started on colestipol 1 g every day.  Notably, she was found to have a positive  FOBT testing.  She has not presented any recent overt gastrointestinal bleeding although she has presented some episodes of fresh blood when wiping.  It is unclear why she has presented this, but is likely related to her external hemorrhoids seen in her most recent colonoscopy 6 years ago.  We had a thorough discussion with both the patient and her husband about the meaning of having a positive FOBT testing.  We discussed that this should ideally be evaluated with an EGD and a colonoscopy.  However, she has severe aortic stenosis in her most recent echocardiogram.  We discussed that prior to proceeding with any endoscopic evaluation, she will need to get an evaluation and clearance by cardiology given the risk of low cardiac output when sedation is administered.  We also discussed the fact that  given the severity of aortic stenosis, if cleared to proceed with this by cardiology, she may need to have these performed at a bigger facility such as [facility].  Due to this, the patient decided to hold off on performing any endoscopic investigations at the moment and she would like to inquire her insurance the possibility of performing a CT colonography.  She will update [place] about the the interest of proceeding with this.  For now we will recheck her iron stores.  -Check iron stores and celiac serologies -Check fecal fat and fecal elastase - Start colestipol 2 g qday -Patient will inquire with insurance if CT colonography is covered -If needing and willing to proceed with colonoscopy will need to obtain cardiac clearance given severe AS  All questions were answered.      Maylon Peppers, MD Gastroenterology and Hepatology [facility]

## 2021-08-29 NOTE — Patient Instructions (Addendum)
Perform blood workup Perform stool workup Start colestipol 2 g qday Can inquire with insurance if CT colonography is covered If needing and willing to proceed with colonoscopy will need to obtain cardiac clearance given severe AS

## 2021-08-30 LAB — IRON,TIBC AND FERRITIN PANEL
%SAT: 25 % (calc) (ref 16–45)
Ferritin: 333 ng/mL — ABNORMAL HIGH (ref 16–288)
Iron: 77 ug/dL (ref 45–160)
TIBC: 303 mcg/dL (calc) (ref 250–450)

## 2021-08-30 LAB — CELIAC DISEASE PANEL
(tTG) Ab, IgA: <1 U/mL
(tTG) Ab, IgG: <1 U/mL
Gliadin IgA: <1 U/mL
Gliadin IgG: <1 U/mL
Immunoglobulin A: 106 mg/dL (ref 70–320)

## 2021-09-02 ENCOUNTER — Encounter: Payer: Self-pay | Admitting: Nurse Practitioner

## 2021-09-02 ENCOUNTER — Ambulatory Visit (INDEPENDENT_AMBULATORY_CARE_PROVIDER_SITE_OTHER): Payer: Medicare Other | Admitting: Nurse Practitioner

## 2021-09-02 VITALS — BP 130/44 | HR 64 | Temp 97.9°F | Ht 68.5 in | Wt 174.0 lb

## 2021-09-02 DIAGNOSIS — F32A Depression, unspecified: Secondary | ICD-10-CM

## 2021-09-02 DIAGNOSIS — R52 Pain, unspecified: Secondary | ICD-10-CM

## 2021-09-02 DIAGNOSIS — I10 Essential (primary) hypertension: Secondary | ICD-10-CM

## 2021-09-02 DIAGNOSIS — F419 Anxiety disorder, unspecified: Secondary | ICD-10-CM | POA: Diagnosis not present

## 2021-09-02 DIAGNOSIS — Z79891 Long term (current) use of opiate analgesic: Secondary | ICD-10-CM | POA: Diagnosis not present

## 2021-09-02 DIAGNOSIS — K219 Gastro-esophageal reflux disease without esophagitis: Secondary | ICD-10-CM | POA: Diagnosis not present

## 2021-09-02 MED ORDER — HYDROCODONE-ACETAMINOPHEN 5-325 MG PO TABS: ORAL_TABLET | ORAL | 0 refills | Status: DC

## 2021-09-02 NOTE — Progress Notes (Signed)
Subjective:    Patient ID: Sabrina Deleon, female    DOB: Jun 21, 1950, 71 y.o.   MRN: [id number]  HPI 3 month follow up    This patient was seen today for chronic pain  The medication list was reviewed and updated.  Location of Pain for which the patient has been treated with regarding narcotics: all over shoulders, arm, back , feet legs , left hip   Onset of this pain: chronic   -Compliance with medication: daily  - Number patient states they take daily: 1/2 bid  -when was the last dose patient took? This morning  The patient was advised the importance of maintaining medication and not using illegal substances with these.  Here for refills and follow up  The patient was educated that we can provide 3 monthly scripts for their medication, it is their responsibility to follow the instructions.  Side effects or complications from medications: no  Patient is aware that pain medications are meant to minimize the severity of the pain to allow their pain levels to improve to allow for better function. They are aware of that pain medications cannot totally remove their pain.  Due for UDT ( at least once per year) : today  Scale of 1 to 10 ( 1 is least 10 is most) Your pain level without the medicine: 8 Your pain level with medication 3  Scale 1 to 10 ( 1-helps very little, 10 helps very well) How well does your pain medication reduce your pain so you can function better through out the day? 3  Glucose log indicates that her blood sugars are running very well most in the low 100s with only 1 reading over 200 over the past few months.  Postprandial blood sugars are running low to mid 100s.  The one time her sugar ran about 200 she had a large carbohydrate and sugar intake. Has decreased her Celexa to 20 mg and states she is doing fine on the new dose. BP is "good" at home but does not have a record available today.  States she gets slightly off balance briefly when she stands  suddenly from a sitting or lying position but denies any syncopal episodes. Had a recent work-up with GI, the note indicates the blood in her stool was most likely from external hemorrhoids but recommends EGD and colonoscopy only after cardiology clearance due to aortic stenosis.  Patient was also to consider possible CT colonography as far as insurance. Patient has cut back her Protonix as previously advised but has noticed flareup of her upper mid abdominal pain and discomfort.  Minimal caffeine intake.  Non-smoker.  No regular alcohol use.  Does eat a lot of citrusy foods such as tomatoes. Has had loose stools several times a day since her cholecystectomy.  Did not start Colestid prescribed by GI specialist due to cost.  Has stopped her oral iron therapy per GI instructions.    Review of Systems  Constitutional:  Positive for fatigue.  Respiratory:  Negative for cough, chest tightness, shortness of breath and wheezing.   Cardiovascular:  Negative for chest pain and palpitations.  Gastrointestinal:  Positive for abdominal pain and diarrhea. Negative for nausea and vomiting.       Localized upper mid abdominal pain.  Neurological:  Positive for light-headedness. Negative for syncope.       Brief lightheadedness only with sudden standing.       Objective:   Physical Exam NAD.  Alert, oriented.  Weight stable.  Lungs clear.  Heart regular rate rhythm.  Abdomen soft nondistended with distinct upper mid epigastric area tenderness to deep palpation.  Abdominal exam is otherwise benign.  No rebound or guarding.  No obvious masses. Today's Vitals   09/02/21 1051 09/02/21 1141  BP: (!) 104/47 (!) 130/44  Pulse: 64   Temp: 97.9 F (36.6 C)   Weight: 174 lb (78.9 kg)   Height: 5' 8.5" (1.74 m)    Body mass index is 26.07 kg/m.       Assessment & Plan:   Problem List Items Addressed This Visit       Cardiovascular and Mediastinum   Hypertension     Digestive   Gastroesophageal  reflux disease without esophagitis     Other   Anxiety and depression   Encounter for long-term opiate analgesic use   Other Visit Diagnoses     Pain management    -  Primary   Relevant Orders   ToxASSURE Select 12(MW)      Meds ordered this encounter  Medications   HYDROcodone-acetaminophen (NORCO/VICODIN) 5-325 MG tablet    Sig: Take one tab po BID prn pain    Dispense:  45 tablet    Refill:  0    May fill 30 days from 08/16/21    Order Specific Question:   Supervising Provider    Answer:   Sallee Lange A [[id number]]   HYDROcodone-acetaminophen (NORCO/VICODIN) 5-325 MG tablet    Sig: Take 1 tab po BID prn pain    Dispense:  45 tablet    Refill:  0    May fill 60 days from 08/16/21    Order Specific Question:   Supervising Provider    Answer:   Sallee Lange A [[id number]]   Continue Celexa 20 mg that she is currently taking. Recommend continuing to monitor her sugar and blood pressure outside the office. Restart pantoprazole daily due to her reflux symptoms and abdominal pain.  Consider adding Carafate if this does not improve.  Follow-up with GI specialist as planned. Continue activities as tolerated. Return in about 3 months (around 12/03/2021).

## 2021-09-07 LAB — TOXASSURE SELECT 12(MW)

## 2021-09-29 ENCOUNTER — Telehealth: Payer: Self-pay | Admitting: Family Medicine

## 2021-09-29 NOTE — Telephone Encounter (Signed)
[facility] [address] requesting refill on Zolpidem 5 mg tablets. Take one tablet po qhs. Pt last seen 09/02/21 for pain management. Please advise. Thank you

## 2021-09-30 ENCOUNTER — Other Ambulatory Visit: Payer: Self-pay | Admitting: Nurse Practitioner

## 2021-09-30 MED ORDER — ZOLPIDEM TARTRATE 5 MG PO TABS: 5.0000 mg | ORAL_TABLET | Freq: Every day | ORAL | 3 refills | Status: DC

## 2021-09-30 NOTE — Telephone Encounter (Signed)
Done

## 2021-10-20 ENCOUNTER — Other Ambulatory Visit: Payer: Medicare Other

## 2021-11-03 ENCOUNTER — Ambulatory Visit (INDEPENDENT_AMBULATORY_CARE_PROVIDER_SITE_OTHER): Payer: Medicare Other

## 2021-11-03 DIAGNOSIS — I350 Nonrheumatic aortic (valve) stenosis: Secondary | ICD-10-CM | POA: Diagnosis not present

## 2021-11-04 LAB — ECHOCARDIOGRAM COMPLETE
AR max vel: 0.73 cm2
AV Area VTI: 0.71 cm2
AV Area mean vel: 0.69 cm2
AV Mean grad: 57 mmHg
AV Peak grad: 90.5 mmHg
Ao pk vel: 4.76 m/s
Area-P 1/2: 1.37 cm2
Calc EF: 58.4 %
MV M vel: 6.55 m/s
MV Peak grad: 171.7 mmHg
MV VTI: 1.33 cm2
P 1/2 time: 533 msec
Radius: 0.55 cm
S' Lateral: 4.1 cm
Single Plane A2C EF: 58 %
Single Plane A4C EF: 58 %

## 2021-11-06 NOTE — Progress Notes (Unsigned)
Cardiology Office Note  Date: 11/07/2021   ID: Sabrina Deleon [birth year], MRN [id number]  PCP:  Kathyrn Drown, MD  Cardiologist:  Rozann Lesches, MD Electrophysiologist:  None   Chief Complaint  Patient presents with   Cardiac follow-up    History of Present Illness: Sabrina Deleon is a 71 y.o. female last seen in February to reestablish cardiology follow-up.  She is here today with her husband.  She does not report any angina at this time, NYHA class II dyspnea with typical activities.  She has been trying to lose some weight.  Follow-up echocardiogram done recently showed LVEF 60 to 65% with moderate diastolic dysfunction, moderate mitral regurgitation and moderate mitral stenosis with mean gradient 8 mmHg, severe calcific aortic stenosis with mean gradient increased to 57 mmHg from last study and a dimensionless index of 0.28.  I discussed these results with her today.  She has been very worried about considering surgery.  We discussed getting her follow-up with Dr. Cyndia Bent for further discussion.  I reviewed her medications which are outlined below.  Past Medical History:  Diagnosis Date   Anxiety    Aortic stenosis    Arthritis    Chronic kidney disease    Dr. Tami Ribas- noted increase kidney function levels- took pt off Metformin and started on Onglyza for Blood sugar control-pt being followed for this.   GERD (gastroesophageal reflux disease)    Gout    Hypertension    Hypothyroidism    Morbid obesity ([place]) 03/29/2018   Patient has elevated BMI with diabetes hyperlipidemia and hypertension   Pancreatitis    Type 2 diabetes mellitus ([place])    Wears glasses    Wears partial dentures    Upper    Past Surgical History:  Procedure Laterality Date   BACK SURGERY     Lumbar   CARPAL TUNNEL RELEASE  11/02/2011   Procedure: CARPAL TUNNEL RELEASE;  Surgeon: Tennis Must, MD;  Location: [facility];  Service: Orthopedics;  Laterality: Right;    CARPAL TUNNEL RELEASE Left 01/14/2015   Procedure: LEFT CARPAL TUNNEL RELEASE;  Surgeon: Leanora Cover, MD;  Location: [facility];  Service: Orthopedics;  Laterality: Left;   CHOLECYSTECTOMY     laparoscopic   COLONOSCOPY     COLONOSCOPY N/A 11/12/2015   Procedure: COLONOSCOPY;  Surgeon: Rogene Houston, MD;  [facility];  Service: Endoscopy;  Laterality: N/A;  9:30   ESOPHAGOGASTRODUODENOSCOPY N/A 11/12/2015   Procedure: ESOPHAGOGASTRODUODENOSCOPY (EGD);  Surgeon: Rogene Houston, MD;  [facility];  Service: Endoscopy;  Laterality: N/A;   REVERSE SHOULDER ARTHROPLASTY Right 11/04/2018   REVERSE SHOULDER ARTHROPLASTY Right 11/04/2018   Procedure: REVERSE SHOULDER ARTHROPLASTY;  Surgeon: Verner Mould, MD;  Location: [facility];  Service: Orthopedics;  Laterality: Right;   RIGHT/LEFT HEART CATH AND CORONARY ANGIOGRAPHY N/A 10/18/2017   Procedure: RIGHT/LEFT HEART CATH AND CORONARY ANGIOGRAPHY;  Surgeon: Burnell Blanks, MD;  Location: [facility];  Service: Cardiovascular;  Laterality: N/A;   TOTAL HIP ARTHROPLASTY Left 03/22/2016   Procedure: LEFT TOTAL HIP ARTHROPLASTY ANTERIOR APPROACH;  Surgeon: Gaynelle Arabian, MD;  Location: [facility];  Service: Orthopedics;  Laterality: Left;    Current Outpatient Medications  Medication Sig Dispense Refill   allopurinol (ZYLOPRIM) 100 MG tablet TAKE 1 TABLET(100 MG) BY MOUTH TWICE DAILY 180 tablet 1   aspirin EC 81 MG tablet Take 81 mg by mouth daily.  Cholecalciferol (VITAMIN D PO) Take 2,000 Units by mouth daily.     citalopram (CELEXA) 20 MG tablet TAKE 1 AND 1/2 TABLETS BY MOUTH EVERY DAY 135 tablet 1   ferrous sulfate 325 (65 FE) MG tablet Take 325 mg by mouth daily with breakfast.     furosemide (LASIX) 40 MG tablet TAKE 1 TABLET(40 MG) BY MOUTH DAILY 90 tablet 1   HYDROcodone-acetaminophen (NORCO/VICODIN) 5-325 MG tablet Take one tab po BID prn pain 45 tablet 0   HYDROcodone-acetaminophen  (NORCO/VICODIN) 5-325 MG tablet Take 1 tab po BID prn pain 45 tablet 0   levothyroxine (SYNTHROID) 125 MCG tablet Take 1 tablet (125 mcg total) by mouth daily before breakfast. 90 tablet 1   lisinopril (ZESTRIL) 20 MG tablet Take 10 mg by mouth daily.     MISC NATURAL PRODUCTS PO Take 1 tablet by mouth daily. Beet Root Chewable     Multiple Vitamin (MULTIVITAMIN WITH MINERALS) TABS tablet Take 1 tablet by mouth daily.     ONETOUCH ULTRA test strip TEST ONCE DAILY 50 strip 5   pantoprazole (PROTONIX) 40 MG tablet TAKE 1 TABLET(40 MG) BY MOUTH DAILY (Patient taking differently: 40 mg every other day.) 90 tablet 1   potassium chloride (KLOR-CON M) 10 MEQ tablet Take 1 tablet by mouth on Monday, Wednesday and Friday 14 tablet 6   rosuvastatin (CRESTOR) 10 MG tablet TAKE 1 TABLET(10 MG) BY MOUTH DAILY FOR CHOLESTEROL. STOP WHILE TAKING COLCHICINE FOR GOUT 90 tablet 1   triamcinolone cream (KENALOG) 0.1 % Apply 1 application  topically 2 (two) times daily. prn     zolpidem (AMBIEN) 5 MG tablet Take 1 tablet (5 mg total) by mouth at bedtime. 30 tablet 3   No current facility-administered medications for this visit.   Allergies:  Patient has no known allergies.   ROS: No palpitations or syncope.  Physical Exam: VS:  BP 122/70   Pulse (!) 51   Ht '5\' 8"'$  (1.727 m)   Wt 172 lb (78 kg)   SpO2 99%   BMI 26.15 kg/m , BMI Body mass index is 26.15 kg/m.  Wt Readings from Last 3 Encounters:  11/07/21 172 lb (78 kg)  09/02/21 174 lb (78.9 kg)  08/29/21 175 lb 1.6 oz (79.4 kg)    General: Patient appears comfortable at rest. HEENT: Conjunctiva and lids normal. Neck: Supple, no elevated JVP, radiation of cardiac murmur to the carotids. Lungs: Clear to auscultation, nonlabored breathing at rest. Cardiac: Regular rate and rhythm, no S3, 4/6 systolic murmur, no pericardial rub. Extremities: No pitting edema.  ECG:  An ECG dated 05/02/2021 was personally reviewed today and demonstrated:  Sinus  arrhythmia with LVH.  Recent Labwork: 05/30/2021: ALT 11; AST 31; BUN 15; Creatinine, Ser 1.08; Hemoglobin 10.1; Platelets 211; Potassium 3.4; Sodium 142; TSH 0.652     Component Value Date/Time   CHOL 111 05/30/2021 1015   TRIG 72 05/30/2021 1015   HDL 44 05/30/2021 1015   CHOLHDL 2.5 05/30/2021 1015   [place] 52 05/30/2021 1015    Other Studies Reviewed Today:  Cardiac catheterization 10/18/2017: Prox LAD lesion is 20% stenosed. Prox RCA to Mid RCA lesion is 20% stenosed. Hemodynamic findings consistent with mild pulmonary hypertension.   1. Mild non-obstructive CAD 2. Severe aortic stenosis with mean gradient of 26 mmHg, peak gradient 34 mmHg. (By echo the mean gradient is 42mHg and the leaflets are thickened with poor mobility).   Echocardiogram 11/03/2021:  1. Left ventricular ejection fraction, by estimation,  is 60 to 65%. The  left ventricle has normal function. The left ventricle has no regional  wall motion abnormalities. There is mild left ventricular hypertrophy.  Left ventricular diastolic parameters  are consistent with Grade II diastolic dysfunction (pseudonormalization).  Elevated left atrial pressure. The average left ventricular global  longitudinal strain is -19.6 %. The global longitudinal strain is normal.   2. Right ventricular systolic function is normal. The right ventricular  size is normal. Tricuspid regurgitation signal is inadequate for assessing  PA pressure.   3. Left atrial size was moderately dilated.   4. The mitral valve is abnormal. Moderate mitral valve regurgitation.  Moderate mitral stenosis. MV peak gradient, 20.8 mmHg. The mean mitral  valve gradient is 8.0 mmHg.      Moderate mitral annular calcification.   5. The aortic valve has an indeterminant number of cusps. There is severe  calcifcation of the aortic valve. There is severe thickening of the aortic  valve. Aortic valve regurgitation is moderate. Severe aortic valve  stenosis. .  Severe aortic stenosis is   present. Aortic valve mean gradient measures 57.0 mmHg. Aortic valve peak  gradient measures 90.5 mmHg. Aortic valve area, by VTI measures 0.71 cm.   6. The inferior vena cava is normal in size with greater than 50%  respiratory variability, suggesting right atrial pressure of 3 mmHg.   Assessment and Plan:  1.  Severe calcific aortic stenosis with mean gradient now 57 mmHg by follow-up echocardiogram.  LVEF remains normal at 60 to 65% with moderate diastolic dysfunction.  She had previously been evaluated for SAVR by Dr. Cyndia Bent, although has been very hesitant to pursue surgery.  She does not describe angina and states NYHA class II dyspnea with typical activities.  I discussed her risks associated with progressive aortic stenosis and her need to consider further discussion with Dr. Cyndia Bent.  We will get this arranged.  2.  Mild, nonobstructive CAD by cardiac catheterization in 2019 during her initial evaluation for aortic stenosis.  She is currently on aspirin and Crestor.  3.  Mitral valve disease, both moderate mitral stenosis and regurgitation by recent echocardiogram, mean gradient 8 mmHg.  This also complicates management of aortic stenosis.  4.  Asymptomatic carotid artery disease, nonobstructive by assessment in 2019.  Medication Adjustments/Labs and Tests Ordered: Current medicines are reviewed at length with the patient today.  Concerns regarding medicines are outlined above.   Tests Ordered: Orders Placed This Encounter  Procedures   Ambulatory referral to Cardiothoracic Surgery    Medication Changes: No orders of the defined types were placed in this encounter.   Disposition:  Follow up  with Dr. Cyndia Bent and then determine further disposition.  Signed, Satira Sark, MD, [facility] 11/07/2021 10:06 AM    [facility] [place], [place], [place] [address] Phone: [phone]; Fax: [phone]

## 2021-11-07 ENCOUNTER — Ambulatory Visit: Payer: Medicare Other | Admitting: Cardiology

## 2021-11-07 ENCOUNTER — Encounter: Payer: Self-pay | Admitting: Cardiology

## 2021-11-07 VITALS — BP 122/70 | HR 51 | Ht 68.0 in | Wt 172.0 lb

## 2021-11-07 DIAGNOSIS — I350 Nonrheumatic aortic (valve) stenosis: Secondary | ICD-10-CM | POA: Diagnosis not present

## 2021-11-07 DIAGNOSIS — I059 Rheumatic mitral valve disease, unspecified: Secondary | ICD-10-CM | POA: Diagnosis not present

## 2021-11-07 DIAGNOSIS — I25119 Atherosclerotic heart disease of native coronary artery with unspecified angina pectoris: Secondary | ICD-10-CM | POA: Diagnosis not present

## 2021-11-07 DIAGNOSIS — I6523 Occlusion and stenosis of bilateral carotid arteries: Secondary | ICD-10-CM | POA: Diagnosis not present

## 2021-11-07 NOTE — Patient Instructions (Addendum)
Medication Instructions:  Your physician recommends that you continue on your current medications as directed. Please refer to the Current Medication list given to you today.   Labwork: none  Testing/Procedures: none  Follow-Up: Your physician recommends that you schedule a follow-up appointment in: pending [place] appointment  Any Other Special Instructions Will Be Listed Below (If Applicable). You have been referred to Dr. Cyndia Bent  If you need a refill on your cardiac medications before your next appointment, please call your pharmacy.

## 2021-11-11 ENCOUNTER — Other Ambulatory Visit: Payer: Self-pay | Admitting: Nurse Practitioner

## 2021-11-11 ENCOUNTER — Other Ambulatory Visit: Payer: Self-pay

## 2021-11-11 DIAGNOSIS — I350 Nonrheumatic aortic (valve) stenosis: Secondary | ICD-10-CM

## 2021-11-11 MED ORDER — NITROFURANTOIN MONOHYD MACRO 100 MG PO CAPS: 100.0000 mg | ORAL_CAPSULE | Freq: Two times a day (BID) | ORAL | 0 refills | Status: DC

## 2021-11-11 NOTE — Progress Notes (Signed)
Phone call from patient. Experiencing dysuria for a few days. Cannot come in today. No fever or back pain. Dribbling urine. Taking fluids well. No recent UTI. Will send in [place]. Recommend visit next week if no better, go to ED or urgent care over the weekend if worse.

## 2021-11-15 ENCOUNTER — Other Ambulatory Visit: Payer: Self-pay

## 2021-11-15 DIAGNOSIS — I350 Nonrheumatic aortic (valve) stenosis: Secondary | ICD-10-CM

## 2021-11-16 LAB — BASIC METABOLIC PANEL
BUN/Creatinine Ratio: 16 (ref 12–28)
BUN: 18 mg/dL (ref 8–27)
CO2: 24 mmol/L (ref 20–29)
Calcium: 10.5 mg/dL — ABNORMAL HIGH (ref 8.7–10.3)
Chloride: 99 mmol/L (ref 96–106)
Creatinine, Ser: 1.13 mg/dL — ABNORMAL HIGH (ref 0.57–1.00)
Glucose: 110 mg/dL — ABNORMAL HIGH (ref 70–99)
Potassium: 3.9 mmol/L (ref 3.5–5.2)
Sodium: 136 mmol/L (ref 134–144)
eGFR: 52 mL/min/{1.73_m2} — ABNORMAL LOW (ref >59)

## 2021-11-24 ENCOUNTER — Other Ambulatory Visit: Payer: Self-pay

## 2021-11-24 MED ORDER — LEVOTHYROXINE SODIUM 125 MCG PO TABS: 125.0000 ug | ORAL_TABLET | Freq: Every day | ORAL | 1 refills | Status: DC

## 2021-11-29 ENCOUNTER — Ambulatory Visit (HOSPITAL_COMMUNITY)
Admission: RE | Admit: 2021-11-29 | Discharge: 2021-11-29 | Disposition: A | Discharge status: Home / Self Care | Payer: Medicare Other | Source: Ambulatory Visit | Attending: Surgery | Admitting: Surgery

## 2021-11-29 ENCOUNTER — Other Ambulatory Visit: Payer: Self-pay | Admitting: *Deleted

## 2021-11-29 DIAGNOSIS — R911 Solitary pulmonary nodule: Secondary | ICD-10-CM | POA: Diagnosis not present

## 2021-11-29 DIAGNOSIS — Z01818 Encounter for other preprocedural examination: Secondary | ICD-10-CM | POA: Diagnosis not present

## 2021-11-29 DIAGNOSIS — I350 Nonrheumatic aortic (valve) stenosis: Secondary | ICD-10-CM | POA: Diagnosis not present

## 2021-11-29 DIAGNOSIS — M7989 Other specified soft tissue disorders: Secondary | ICD-10-CM

## 2021-11-29 DIAGNOSIS — M47816 Spondylosis without myelopathy or radiculopathy, lumbar region: Secondary | ICD-10-CM | POA: Diagnosis not present

## 2021-11-29 MED ORDER — IOHEXOL 350 MG/ML SOLN
100.0000 mL | Freq: Once | INTRAVENOUS | Status: AC | PRN
  Administered 2021-11-29: 100 mL via INTRAVENOUS

## 2021-11-29 NOTE — Progress Notes (Unsigned)
PET

## 2021-12-02 ENCOUNTER — Ambulatory Visit (INDEPENDENT_AMBULATORY_CARE_PROVIDER_SITE_OTHER): Payer: Medicare Other | Admitting: Nurse Practitioner

## 2021-12-02 VITALS — BP 138/70 | HR 67 | Ht 68.0 in | Wt 169.6 lb

## 2021-12-02 DIAGNOSIS — N1832 Chronic kidney disease, stage 3b: Secondary | ICD-10-CM

## 2021-12-02 DIAGNOSIS — E1122 Type 2 diabetes mellitus with diabetic chronic kidney disease: Secondary | ICD-10-CM

## 2021-12-02 DIAGNOSIS — Z79891 Long term (current) use of opiate analgesic: Secondary | ICD-10-CM | POA: Diagnosis not present

## 2021-12-02 DIAGNOSIS — E785 Hyperlipidemia, unspecified: Secondary | ICD-10-CM

## 2021-12-02 DIAGNOSIS — I10 Essential (primary) hypertension: Secondary | ICD-10-CM | POA: Diagnosis not present

## 2021-12-03 ENCOUNTER — Encounter: Payer: Self-pay | Admitting: Nurse Practitioner

## 2021-12-03 MED ORDER — HYDROCODONE-ACETAMINOPHEN 5-325 MG PO TABS: ORAL_TABLET | ORAL | 0 refills | Status: DC

## 2021-12-03 NOTE — Progress Notes (Signed)
Subjective:    Patient ID: Sabrina Deleon, female    DOB: [birth year], 71 y.o.   MRN: [id number]  HPI This patient was seen today for chronic pain  The medication list was reviewed and updated.  Location of Pain for which the patient has been treated with regarding narcotics: multiple joints  Onset of this pain: chronic   -Compliance with medication: daily  - Number patient states they take daily: 1/2 tab po BID  -when was the last dose patient took? This am  The patient was advised the importance of maintaining medication and not using illegal substances with these.  Here for refills and follow up  The patient was educated that we can provide 3 monthly scripts for their medication, it is their responsibility to follow the instructions.  Side effects or complications from medications: none  Patient is aware that pain medications are meant to minimize the severity of the pain to allow their pain levels to improve to allow for better function. They are aware of that pain medications cannot totally remove their pain.  Due for UDT ( at least once per year) : 09/02/21 Has been using comfrey which is a natural supplement which greatly helps her pain.  Patient also here for routine follow-up on her diabetes, hypertension, and her cholesterol.  Does not have much of an appetite.  Eats small amounts of food.  Continues to lose weight.  Is currently undergoing a work-up for a mesenteric soft tissue mass, has a PET scan ordered.  Does experience some tenderness in this area. States her blood sugars at home are running 98-134 fasting.  Checks it at different times of the day.  All of her blood sugars have been well below 200.  Her BP at home runs 120-135/55.    Review of Systems  Constitutional:  Positive for fatigue.       Continues to experience a decreased appetite but no change.  Limited activity due to orthopedic issues.  HENT:  Negative for sore throat and trouble swallowing.    Respiratory:  Negative for cough, chest tightness and shortness of breath.   Cardiovascular:  Negative for chest pain, palpitations and leg swelling.  Gastrointestinal:  Positive for abdominal pain.       Being followed by cardiology and gastroenterology.  Neurological:  Negative for syncope, facial asymmetry, speech difficulty, weakness and numbness.       Objective:   Physical Exam NAD.  Alert, oriented.  Thyroid nontender to palpation, no mass or goiter noted.  Lungs clear.  Heart regular rate rhythm.  Abdomen soft nondistended with tenderness in the upper mid abdominal area. Today's Vitals   12/02/21 1107  BP: 138/70  Pulse: 67  Weight: 169 lb 9.6 oz (76.9 kg)  Height: '5\' 8"'$  (1.727 m)   Body mass index is 25.79 kg/m. Diabetic Foot Exam - Simple   Simple Foot Form  12/02/2021 11:30 AM  Visual Inspection No deformities, no ulcerations, no other skin breakdown bilaterally: Yes Sensation Testing Intact to touch and monofilament testing bilaterally: Yes Pulse Check Posterior Tibialis and Dorsalis pulse intact bilaterally: Yes Comments Feet cool to the touch but normal color.  Normal capillary refill.  Has significant bunions and hammertoes on each side.  Skin is intact, some calluses noted.        Assessment & Plan:   Problem List Items Addressed This Visit       Cardiovascular and Mediastinum   Hypertension - Primary   Relevant Orders  Microalbumin/Creatinine Ratio, Urine   Hemoglobin A1c     Endocrine   Type 2 diabetes mellitus (HCC)   Relevant Orders   Microalbumin/Creatinine Ratio, Urine   Hemoglobin A1c     Other   Encounter for long-term opiate analgesic use   Hyperlipidemia    Encourage patient to take a supplement such as chewable MVI since she has a poor appetite.  Her GFR is well above 50 over the past year. Hemoglobin A1c and urine ACR ordered. Continue current medications.  Patient to contact pharmacy when refills are needed. Patient may take  up to 1 whole tab twice a day of pain medicine if severe. Return in about 3 months (around 03/03/2022).

## 2021-12-07 ENCOUNTER — Institutional Professional Consult (permissible substitution): Payer: Medicare Other | Admitting: Surgery

## 2021-12-07 ENCOUNTER — Other Ambulatory Visit: Payer: Self-pay

## 2021-12-07 ENCOUNTER — Encounter (HOSPITAL_COMMUNITY): Payer: Self-pay

## 2021-12-07 ENCOUNTER — Other Ambulatory Visit: Payer: Self-pay | Admitting: *Deleted

## 2021-12-07 ENCOUNTER — Telehealth: Payer: Self-pay | Admitting: Family Medicine

## 2021-12-07 ENCOUNTER — Encounter (HOSPITAL_COMMUNITY): Payer: Medicare Other

## 2021-12-07 ENCOUNTER — Encounter: Payer: Self-pay | Admitting: Surgery

## 2021-12-07 VITALS — BP 136/64 | HR 57 | Resp 18 | Ht 68.0 in | Wt 169.0 lb

## 2021-12-07 DIAGNOSIS — I350 Nonrheumatic aortic (valve) stenosis: Secondary | ICD-10-CM

## 2021-12-07 DIAGNOSIS — R591 Generalized enlarged lymph nodes: Secondary | ICD-10-CM

## 2021-12-07 DIAGNOSIS — K6389 Other specified diseases of intestine: Secondary | ICD-10-CM

## 2021-12-07 NOTE — Telephone Encounter (Signed)
Nurses May go ahead with urgent referral to oncology for lymphadenopathy and chest mass-needs work-up including PET scan Dr Burr Medico would be preferred Others would include Dr. Alvy Bimler, Dr.Kale, Dr.Shadad

## 2021-12-07 NOTE — Telephone Encounter (Signed)
Patient is aware per drs orders .

## 2021-12-07 NOTE — Telephone Encounter (Signed)
Nurses-reason for referral on previous telephone message from oncology was lymphadenopathy and mesenteric mass not chest mass thank you

## 2021-12-07 NOTE — Progress Notes (Unsigned)
Patient ID: Sabrina Deleon, female   DOB: 1950-08-13, 71 y.o.   MRN: [id number]   [facility]       [address]       [place] [address]             [phone]          CARDIOTHORACIC SURGERY CONSULTATION REPORT  PCP is Wolfgang Phoenix, Elayne Snare, MD Referring Provider is Rozann Lesches, MD Primary Cardiologist is Rozann Lesches, MD  Reason for consultation:  Severe aortic stenosis  HPI:  The patient is a 71 year old woman with a history of hypertension, hypothyroidism, type 2 diabetes, hyperlipidemia, stage IIIb chronic kidney disease, morbid obesity, arthritis, and aortic stenosis that has been followed by Dr. Domenic Polite.  I first saw her on 12/13/2017 for evaluation of severe aortic stenosis.  The mean gradient across her aortic valve on echocardiogram in 02/2017 was 39 mmHg and had increased to 44 mmHg by echo in June 2019.  She had minimal shortness of breath at that time.  We discussed the need for aortic valve replacement and alternatives and she wanted to think about it longer before deciding to proceed with CT scan work-up for consideration of TAVR.  She returned to see me in December 2019 and her gated cardiac CTA showed a heavily calcified valve with a large bar of calcium extending from the noncoronary cusp down into the LVOT.  She was discussed at our multidisciplinary conference and it was felt that she would probably be best served by open surgical aortic valve replacement given her age of 54 and the degree of calcium extending down into the LVOT.  She was terrified of having open surgery and decided she want to wait a little longer and said she would call us back to schedule surgery.  She did not call and was lost to follow-up.  She returned to see Dr. Domenic Polite and had a follow-up echo on 05/10/2021 which showed an aortic valve mean gradient of 44 mmHg with a valve area of 0.78 cm by VTI.  There was moderate aortic  insufficiency with a pressure half-time of 315 ms.  There was moderate to severe mitral valve stenosis with a mean gradient of 10 mmHg and a peak gradient of 24 mmHg with moderate calcification of the leaflets of the mitral valve.  Left ventricular ejection fraction was 55 to 60% with grade 2 diastolic dysfunction.  She was feeling well overall and decided to continue every 53-monthfollow-up.  She had a follow-up 2D echo on 11/03/2021 which showed further increase in the aortic valve mean gradient to 57 mmHg with a peak of 90.5 mmHg and a valve area of 0.71 cm.  There was moderate mitral valve stenosis with a mean gradient of 8 mmHg and a peak gradient of 21 mmHg.  There was moderate mitral annular and mitral valve leaflet calcification.  Left ventricular ejection fraction remains normal at 60 to 65% with grade 2 diastolic dysfunction.  She underwent repeat CT evaluation for consideration of TAVR.  Gated cardiac CTA showed a trileaflet aortic valve with severely reduced cusp separation with a severely thickened and calcified aortic valve.  CTA of the abdomen and pelvis showed a bulky central mesenteric soft tissue mass that in retrospect was present in 2019 on CT scan but smaller.  This measured 5.5 x 3.1 cm and it increased from 4.7 x 1.8 cm in 01/2018.  There are also enlarged left  periaortic lymph nodes and a newly enlarged aortocaval node measuring 1 cm.  There was a hyperdense 1.5 cm lateral upper left renal cortical lesion that it increased in size from 1.0 cm on CT in 01/2018.  There were 3 scattered tiny solid right pulmonary nodules the largest measuring 0.3 cm in the right middle lobe not previously seen on chest CT.  There was new right paratracheal lymphadenopathy up to 1.6 cm and newly enlarged 1.2 cm subcarinal nodes.  There is a newly enlarged 1 cm left hilar node and a newly enlarged 1.1 cm right infrahilar node.  There is a newly enlarged to 2.1 cm right retrocrural lymph node.  The patient is  here today with her husband.  She reports feeling fairly well overall.  She does not exercise but stays busy picking her grandchildren up daily from school and running them around to sporting events.  She denies any significant shortness of breath or fatigue but has decreased her activity level.  She denies any peripheral edema.  She has had no chest pain or pressure.  She does report some vague mid abdominal pains at times as well as diarrhea.  She has had reduced appetite over the last few years and has lost 100 pounds over the past couple years.  Initially she was trying to lose weight but now has noticed reduced appetite.  Past Medical History:  Diagnosis Date   Anxiety    Aortic stenosis    Arthritis    Chronic kidney disease    Dr. Tami Ribas- noted increase kidney function levels- took pt off Metformin and started on Onglyza for Blood sugar control-pt being followed for this.   GERD (gastroesophageal reflux disease)    Gout    Hypertension    Hypothyroidism    Morbid obesity ([place]) 03/29/2018   Patient has elevated BMI with diabetes hyperlipidemia and hypertension   Pancreatitis    Type 2 diabetes mellitus ([place])    Wears glasses    Wears partial dentures    Upper    Past Surgical History:  Procedure Laterality Date   BACK SURGERY     Lumbar   CARPAL TUNNEL RELEASE  11/02/2011   Procedure: CARPAL TUNNEL RELEASE;  Surgeon: Tennis Must, MD;  Location: [facility];  Service: Orthopedics;  Laterality: Right;   CARPAL TUNNEL RELEASE Left 01/14/2015   Procedure: LEFT CARPAL TUNNEL RELEASE;  Surgeon: Leanora Cover, MD;  Location: [facility];  Service: Orthopedics;  Laterality: Left;   CHOLECYSTECTOMY     laparoscopic   COLONOSCOPY     COLONOSCOPY N/A 11/12/2015   Procedure: COLONOSCOPY;  Surgeon: Rogene Houston, MD;  [facility];  Service: Endoscopy;  Laterality: N/A;  9:30   ESOPHAGOGASTRODUODENOSCOPY N/A 11/12/2015   Procedure:  ESOPHAGOGASTRODUODENOSCOPY (EGD);  Surgeon: Rogene Houston, MD;  [facility];  Service: Endoscopy;  Laterality: N/A;   REVERSE SHOULDER ARTHROPLASTY Right 11/04/2018   REVERSE SHOULDER ARTHROPLASTY Right 11/04/2018   Procedure: REVERSE SHOULDER ARTHROPLASTY;  Surgeon: Verner Mould, MD;  Location: [facility];  Service: Orthopedics;  Laterality: Right;   RIGHT/LEFT HEART CATH AND CORONARY ANGIOGRAPHY N/A 10/18/2017   Procedure: RIGHT/LEFT HEART CATH AND CORONARY ANGIOGRAPHY;  Surgeon: Burnell Blanks, MD;  Location: [facility];  Service: Cardiovascular;  Laterality: N/A;   TOTAL HIP ARTHROPLASTY Left 03/22/2016   Procedure: LEFT TOTAL HIP ARTHROPLASTY ANTERIOR APPROACH;  Surgeon: Gaynelle Arabian, MD;  Location: [facility];  Service: Orthopedics;  Laterality: Left;    History reviewed. No pertinent family history.  Social History   Socioeconomic History   Marital status: Married    Spouse name: Not on file   Number of children: Not on file   Years of education: Not on file   Highest education level: Not on file  Occupational History   Not on file  Tobacco Use   Smoking status: Former    Types: Cigarettes    Quit date: 10/29/1981    Years since quitting: 40.1   Smokeless tobacco: Never  Vaping Use   Vaping Use: Never used  Substance and Sexual Activity   Alcohol use: Yes    Comment: occasionally   Drug use: No   Sexual activity: Not Currently  Other Topics Concern   Not on file  Social History Narrative   Not on file   Social Determinants of Health   Financial Resource Strain: Not on file  Food Insecurity: Not on file  Transportation Needs: Not on file  Physical Activity: Not on file  Stress: Not on file  Social Connections: Not on file  Intimate Partner Violence: Not on file    Prior to Admission medications   Medication Sig Start Date End Date Taking? Authorizing Provider  allopurinol (ZYLOPRIM) 100 MG tablet TAKE 1 TABLET(100 MG) BY MOUTH  TWICE DAILY 11/11/21  Yes Kathyrn Drown, MD  aspirin EC 81 MG tablet Take 81 mg by mouth daily.   Yes [provider]  Cholecalciferol (VITAMIN D PO) Take 2,000 Units by mouth daily.   Yes [provider]  citalopram (CELEXA) 20 MG tablet TAKE 1 AND 1/2 TABLETS BY MOUTH EVERY DAY 05/30/21  Yes Luking, Scott A, MD  ferrous sulfate 325 (65 FE) MG tablet Take 325 mg by mouth daily with breakfast.   Yes [provider]  furosemide (LASIX) 40 MG tablet TAKE 1 TABLET(40 MG) BY MOUTH DAILY 06/27/21  Yes Kathyrn Drown, MD  HYDROcodone-acetaminophen (NORCO/VICODIN) 5-325 MG tablet Take one tab po BID prn pain 12/03/21  Yes Nilda Simmer, NP  HYDROcodone-acetaminophen (NORCO/VICODIN) 5-325 MG tablet Take 1 tab po BID prn pain 12/03/21  Yes Nilda Simmer, NP  levothyroxine (SYNTHROID) 125 MCG tablet Take 1 tablet (125 mcg total) by mouth daily before breakfast. 11/24/21  Yes Luking, Scott A, MD  lisinopril (ZESTRIL) 20 MG tablet Take 10 mg by mouth daily.   Yes [provider]  MISC NATURAL PRODUCTS PO Take 1 tablet by mouth daily. Beet Root Chewable   Yes [provider]  Multiple Vitamin (MULTIVITAMIN WITH MINERALS) TABS tablet Take 1 tablet by mouth daily.   Yes [provider]  nitrofurantoin, macrocrystal-monohydrate, (MACROBID) 100 MG capsule Take 1 capsule (100 mg total) by mouth 2 (two) times daily. 11/11/21  Yes Nilda Simmer, NP  ONETOUCH ULTRA test strip TEST ONCE DAILY 04/19/21  Yes Kathyrn Drown, MD  pantoprazole (PROTONIX) 40 MG tablet TAKE 1 TABLET(40 MG) BY MOUTH DAILY Patient taking differently: 40 mg every other day. 03/08/21  Yes Kathyrn Drown, MD  potassium chloride (KLOR-CON M) 10 MEQ tablet Take 1 tablet by mouth on Monday, Wednesday and Friday 05/31/21  Yes Luking, Scott A, MD  rosuvastatin (CRESTOR) 10 MG tablet TAKE 1 TABLET(10 MG) BY MOUTH DAILY FOR CHOLESTEROL. STOP WHILE TAKING COLCHICINE FOR GOUT 08/17/21  Yes  Luking, Scott A, MD  triamcinolone cream (KENALOG) 0.1 % Apply 1 application  topically 2 (two) times daily. prn   Yes  [provider]  zolpidem (AMBIEN) 5 MG tablet Take 1 tablet (5 mg total) by mouth at bedtime. 09/30/21  Yes Nilda Simmer, NP    Current Outpatient Medications  Medication Sig Dispense Refill   allopurinol (ZYLOPRIM) 100 MG tablet TAKE 1 TABLET(100 MG) BY MOUTH TWICE DAILY 180 tablet 1   aspirin EC 81 MG tablet Take 81 mg by mouth daily.     Cholecalciferol (VITAMIN D PO) Take 2,000 Units by mouth daily.     citalopram (CELEXA) 20 MG tablet TAKE 1 AND 1/2 TABLETS BY MOUTH EVERY DAY 135 tablet 1   ferrous sulfate 325 (65 FE) MG tablet Take 325 mg by mouth daily with breakfast.     furosemide (LASIX) 40 MG tablet TAKE 1 TABLET(40 MG) BY MOUTH DAILY 90 tablet 1   HYDROcodone-acetaminophen (NORCO/VICODIN) 5-325 MG tablet Take one tab po BID prn pain 45 tablet 0   HYDROcodone-acetaminophen (NORCO/VICODIN) 5-325 MG tablet Take 1 tab po BID prn pain 45 tablet 0   levothyroxine (SYNTHROID) 125 MCG tablet Take 1 tablet (125 mcg total) by mouth daily before breakfast. 90 tablet 1   lisinopril (ZESTRIL) 20 MG tablet Take 10 mg by mouth daily.     MISC NATURAL PRODUCTS PO Take 1 tablet by mouth daily. Beet Root Chewable     Multiple Vitamin (MULTIVITAMIN WITH MINERALS) TABS tablet Take 1 tablet by mouth daily.     nitrofurantoin, macrocrystal-monohydrate, (MACROBID) 100 MG capsule Take 1 capsule (100 mg total) by mouth 2 (two) times daily. 10 capsule 0   ONETOUCH ULTRA test strip TEST ONCE DAILY 50 strip 5   pantoprazole (PROTONIX) 40 MG tablet TAKE 1 TABLET(40 MG) BY MOUTH DAILY (Patient taking differently: 40 mg every other day.) 90 tablet 1   potassium chloride (KLOR-CON M) 10 MEQ tablet Take 1 tablet by mouth on Monday, Wednesday and Friday 14 tablet 6   rosuvastatin (CRESTOR) 10 MG tablet TAKE 1 TABLET(10 MG) BY MOUTH DAILY FOR CHOLESTEROL. STOP WHILE TAKING COLCHICINE  FOR GOUT 90 tablet 1   triamcinolone cream (KENALOG) 0.1 % Apply 1 application  topically 2 (two) times daily. prn     zolpidem (AMBIEN) 5 MG tablet Take 1 tablet (5 mg total) by mouth at bedtime. 30 tablet 3   No current facility-administered medications for this visit.    No Known Allergies    Review of Systems:   General:  + decreased appetite, normal energy, no weight gain, + 100 lb weight loss, no fever  Cardiac:  no chest pain with exertion, no chest pain at rest, + mild SOB with  exertion, no resting SOB, no PND, no orthopnea, no palpitations, no arrhythmia, no atrial fibrillation, no LE edema, no dizzy spells, no syncope  Respiratory:  no shortness of breath, no home oxygen, no productive cough, no dry cough, no bronchitis, no wheezing, no hemoptysis, no asthma, no pain with inspiration or cough, no sleep apnea, no CPAP at night  GI:   no difficulty swallowing, no reflux, no frequent heartburn, no hiatal hernia, + abdominal pain, no constipation, + diarrhea, no hematochezia, no hematemesis, no melena  GU:   no dysuria,  no frequency, no urinary tract infection, no hematuria, no kidney stones, + chronic kidney disease  Vascular:  no pain suggestive of claudication, no pain in feet, no leg cramps, no varicose veins, no DVT, no non-healing foot ulcer  Neuro:   no stroke, no TIA's, no seizures, no headaches, no temporary blindness one eye,  no slurred speech, no peripheral neuropathy, no chronic pain, no instability of gait, no memory/cognitive dysfunction  Musculoskeletal: + arthritis - primarily involving the knees, no joint swelling, no myalgias, no difficulty walking, normal mobility   Skin:   no rash, no itching, no skin infections, no pressure sores or ulcerations  Psych:   + anxiety, no depression, no nervousness, no unusual recent stress  Eyes:   no blurry vision, + floaters, no recent vision changes, + wears glasses   ENT:   + hearing loss, no loose or painful teeth, no dentures,  last saw dentist 08/2021  Hematologic:  + easy bruising, no abnormal bleeding, no clotting disorder, no frequent epistaxis  Endocrine:  + diabetes, does check CBG's at home     Physical Exam:   BP 136/64 (BP Location: Left Arm, Patient Position: Sitting)   Pulse (!) 57   Resp 18   Ht '5\' 8"'$  (1.727 m)   Wt 169 lb (76.7 kg)   SpO2 99% Comment: RA  BMI 25.70 kg/m   General:  well-appearing  HEENT:  Unremarkable, NCAT, PERLA, EOMI  Neck:   no JVD, no bruits, no adenopathy   Chest:   clear to auscultation, symmetrical breath sounds, no wheezes, no rhonchi   CV:   RRR, 3/6 systolic murmur RSB, no diastolic murmur  Abdomen:  soft, non-tender, no masses   Extremities:  warm, well-perfused, pulses palpable at ankles, no lower extremity edema  Rectal/GU  Deferred  Neuro:   Grossly non-focal and symmetrical throughout  Skin:   Clean and dry, no rashes, no breakdown  Diagnostic Tests:   ECHOCARDIOGRAM REPORT         Patient Name:   SHARLINE LEHANE Date of Exam: 11/03/2021  Medical Rec #:  [id number]     Height:       68.5 in  Accession #:    [id number]    Weight:       174.0 lb  Date of Birth:  1950-09-21     BSA:          1.937 m  Patient Age:    71 years      BP:           130/44 mmHg  Patient Gender: F             HR:           60 bpm.  Exam Location:  [facility]   Procedure: 2D Echo, Cardiac Doppler, Color Doppler and Strain Analysis   Indications:    I35.0 Nonrheumatic aortic (valve) stenosis     History:        Patient has prior history of Echocardiogram examinations,  most                  recent 05/10/2021. Anemia, Aortic Valve Disease and Mitral  Valve                  Disease; Risk Factors:Hypertension, Diabetes, Dyslipidemia  and                  Former Smoker.     Sonographer:    Estill Bamberg McFatter RDMS, RVT, RDCS  Referring Phys: Theresa     1. Left ventricular ejection fraction, by estimation, is 60 to 65%. The  left ventricle has normal  function. The left ventricle has no regional  wall motion abnormalities. There is mild left ventricular hypertrophy.  Left ventricular diastolic parameters  are consistent with Grade II diastolic dysfunction (pseudonormalization).  Elevated left atrial pressure. The average left ventricular global  longitudinal strain is -19.6 %. The global longitudinal strain is normal.   2. Right ventricular systolic function is normal. The right ventricular  size is normal. Tricuspid regurgitation signal is inadequate for assessing  PA pressure.   3. Left atrial size was moderately dilated.   4. The mitral valve is abnormal. Moderate mitral valve regurgitation.  Moderate mitral stenosis. MV peak gradient, 20.8 mmHg. The mean mitral  valve gradient is 8.0 mmHg.      Moderate mitral annular calcification.   5. The aortic valve has an indeterminant number of cusps. There is severe  calcifcation of the aortic valve. There is severe thickening of the aortic  valve. Aortic valve regurgitation is moderate. Severe aortic valve  stenosis. . Severe aortic stenosis is   present. Aortic valve mean gradient measures 57.0 mmHg. Aortic valve peak  gradient measures 90.5 mmHg. Aortic valve area, by VTI measures 0.71 cm.   6. The inferior vena cava is normal in size with greater than 50%  respiratory variability, suggesting right atrial pressure of 3 mmHg.   Comparison(s): Echocardiogram done 05/10/21 showed an EF of 55-60% with  severe AS and an AV Mean Grad of 44 mmHg.   FINDINGS   Left Ventricle: Left ventricular ejection fraction, by estimation, is 60  to 65%. The left ventricle has normal function. The left ventricle has no  regional wall motion abnormalities. The average left ventricular global  longitudinal strain is -19.6 %.  The global longitudinal strain is normal. The left ventricular internal  cavity size was normal in size. There is mild left ventricular  hypertrophy. Left ventricular diastolic  parameters are consistent with  Grade II diastolic dysfunction  (pseudonormalization). Elevated left atrial pressure.   Right Ventricle: The right ventricular size is normal. Right vetricular  wall thickness was not well visualized. Right ventricular systolic  function is normal. Tricuspid regurgitation signal is inadequate for  assessing PA pressure.   Left Atrium: Left atrial size was moderately dilated.   Right Atrium: Right atrial size was normal in size.   Pericardium: There is no evidence of pericardial effusion.   Mitral Valve: The mitral valve is abnormal. There is moderate thickening  of the mitral valve leaflet(s). There is moderate calcification of the  mitral valve leaflet(s). Moderate mitral annular calcification. Moderate  mitral valve regurgitation. Moderate  mitral valve stenosis. MV peak gradient, 20.8 mmHg. The mean mitral valve  gradient is 8.0 mmHg.   Tricuspid Valve: The tricuspid valve is normal in structure. Tricuspid  valve regurgitation is trivial. No evidence of tricuspid stenosis.   Aortic Valve: The aortic valve has an indeterminant number of cusps. There  is severe calcifcation of the aortic valve. There is severe thickening of  the aortic valve. There is severe aortic valve annular calcification.  Aortic valve regurgitation is  moderate. Aortic regurgitation PHT measures 533 msec. Severe aortic  stenosis is present. Aortic valve mean gradient measures 57.0 mmHg. Aortic  valve peak gradient measures 90.5 mmHg. Aortic valve area, by VTI measures  0.71 cm.   Pulmonic Valve: The pulmonic valve was not well visualized. Pulmonic valve  regurgitation is mild. No evidence of pulmonic stenosis.   Aorta: The aortic root is normal in size and structure.   Venous: The inferior vena cava is normal in size with greater than 50%  respiratory variability, suggesting right atrial pressure of 3 mmHg.  IAS/Shunts: No atrial level shunt detected by color flow  Doppler.      LEFT VENTRICLE  PLAX 2D  LVIDd:         5.83 cm      Diastology  LVIDs:         4.10 cm      LV e' medial:    3.70 cm/s  LV PW:         1.10 cm      LV E/e' medial:  62.4  LV IVS:        1.10 cm      LV e' lateral:   3.70 cm/s  LVOT diam:     1.80 cm      LV E/e' lateral: 62.4  LV SV:         106  LV SV Index:   55           2D Longitudinal Strain  LVOT Area:     2.54 cm     2D Strain GLS (A2C):   -20.0 %                              2D Strain GLS (A3C):   -22.4 %                              2D Strain GLS (A4C):   -16.4 %  LV Volumes (MOD)            2D Strain GLS Avg:     -19.6 %  LV vol d, MOD A2C: 221.0 ml  LV vol d, MOD A4C: 129.0 ml  LV vol s, MOD A2C: 92.8 ml  LV vol s, MOD A4C: 54.2 ml  LV SV MOD A2C:     128.2 ml  LV SV MOD A4C:     129.0 ml  LV SV MOD BP:      106.7 ml   RIGHT VENTRICLE  RV S prime:     12.40 cm/s  TAPSE (M-mode): 2.0 cm   LEFT ATRIUM            Index        RIGHT ATRIUM           Index  LA diam:      4.90 cm  2.53 cm/m   RA Area:     16.70 cm  LA Vol (A2C): 123.0 ml 63.51 ml/m  RA Volume:   48.70 ml  25.14 ml/m  LA Vol (A4C): 86.7 ml  44.76 ml/m   AORTIC VALVE                     PULMONIC VALVE  AV Area (Vmax):    0.73 cm      PR End Diast Vel: 11.36 msec  AV Area (Vmean):   0.69 cm  AV Area (VTI):     0.71 cm  AV Vmax:           475.60 cm/s  AV Vmean:          358.800 cm/s  AV VTI:            1.490 m  AV Peak Grad:      90.5 mmHg  AV Mean Grad:      57.0 mmHg  LVOT Vmax:         136.00 cm/s  LVOT Vmean:  97.400 cm/s  LVOT VTI:          0.417 m  LVOT/AV VTI ratio: 0.28  AI PHT:            533 msec     AORTA  Ao Root diam: 3.10 cm  Ao Asc diam:  3.10 cm   MITRAL VALVE                  TRICUSPID VALVE  MV Area (PHT): 1.37 cm       TR Peak grad:   31.6 mmHg  MV Area VTI:   1.33 cm       TR Vmax:        281.00 cm/s  MV Peak grad:  20.8 mmHg  MV Mean grad:  8.0 mmHg       SHUNTS  MV Vmax:       2.28 m/s        Systemic VTI:  0.42 m  MV Vmean:      136.0 cm/s     Systemic Diam: 1.80 cm  MV Decel Time: 555 msec  MR Peak grad:    171.7 mmHg  MR Mean grad:    119.0 mmHg  MR Vmax:         655.25 cm/s  MR Vmean:        525.0 cm/s  MR PISA:         1.90 cm  MR PISA Eff ROA: 7 mm  MR PISA Radius:  0.55 cm  MV E velocity: 231.00 cm/s  MV A velocity: 122.00 cm/s  MV E/A ratio:  1.89   Carlyle Dolly MD  Electronically signed by Carlyle Dolly MD  Signature Date/Time: 11/04/2021/10:01:46 AM         Final     ADDENDUM REPORT: 12/05/2021 09:03   CLINICAL DATA:  Aortic valve replacement (TAVR), pre-op eval   EXAM: Cardiac TAVR CT   TECHNIQUE: The patient was scanned on a Siemens Force 950 slice scanner. A 120 kV retrospective scan was triggered in the descending thoracic aorta at 111 HU's. Gantry rotation speed was 270 msecs and collimation was .9 mm. The 3D data set was reconstructed in 5% intervals of the R-R cycle. Systolic and diastolic phases were analyzed on a dedicated work station using MPR, MIP and VRT modes. The patient received 185m OMNIPAQUE IOHEXOL 350 MG/ML SOLN of contrast.   FINDINGS: Aortic Valve:   Tricuspid aortic valve. Severely reduced cusp separation. Severely thickened, severely calcified aortic valve cusps.   AV calcium score: 3761   Virtual Basal Annulus Measurements:   Maximum/Minimum Diameter: 30.4 x 22.9 mm   Perimeter: 84.5 mm   Area:  540 mm2   LVOT calcifications below commissure of non- and left coronary cusps.   Membranous septal length: 5.5 mm   Based on these measurements, the annulus would be suitable for a 29 mm Sapien 3 valve. Measurements are borderline for 26 mm Sapien 3 valve, recommend heart team discussion for valve sizing. Based on measurements, can also consider 34 mm Medtronic Evolut valve.   Sinus of Valsalva Measurements:   Non-coronary:  32 mm   Right - coronary:  31 mm   Left - coronary:  31 mm   Sinus of  Valsalva Height:   Left: 22.6 mm   Right: 18.3 mm   Aorta: Normal variant 4 vessel branch pattern of aortic arch, left vertebral artery originates off aortic arch.   Sinotubular Junction: 28.5 mm   Ascending Thoracic  Aorta:  35 mm   Aortic Arch:  26 mm   Descending Thoracic Aorta not fully visualized, measurement made at proximal descending aorta: 28 mm   Coronary Artery Height above Annulus:   Left main: 16.8 mm   Right coronary: 20 mm   Coronary Arteries: Normal coronary origin. Right dominance. The study was performed without use of NTG and insufficient for plaque evaluation. Coronary artery calcium seen in left coronary system distribution.   Optimum Fluoroscopic Angle for Delivery: LAO 8 CAU 7   OTHER:   Atria: Biatrial chamber dilation   Left atrial appendage: No thrombus.   Mitral valve: moderate mitral annular calcifications.   Pulmonary artery: Mild dilation, 31 mm.   Pulmonary veins: Normal anatomy.   IMPRESSION: 1. Tricuspid aortic valve with severely reduced cusp excursion. Severely thickened and severely calcified aortic valve cusps.   2.  Aortic valve calcium score: 3761   3. Annulus area: 540 mm2, suitable for 29 mm Sapien 3 valve. LVOT calcifications below commissure of non- and left coronary cusps. Recommend Heart Team discussion for valve sizing.   4.  Sufficient coronary artery heights from annulus.   5.  Optimum fluoroscopic angle for delivery: LAO 8 CAU 7     Electronically Signed   By: Cherlynn Kaiser M.D.   On: 12/05/2021 09:03    Addended by Elouise Munroe, MD on 12/05/2021  9:05 AM   Study Result  Narrative & Impression  EXAM: OVER-READ INTERPRETATION  CT CHEST   The following report is a limited chest CT over-read performed by radiologist Dr. Salvatore Marvel of [facility], [place] on 11/29/2021. This over-read does not include interpretation of cardiac or coronary anatomy or pathology. The cardiac CTA interpretation  by the cardiologist is attached.   COMPARISON:  02/01/2018 chest CT angiogram.   FINDINGS: Please see the separate concurrent chest CT angiogram report for details.   IMPRESSION: Please see the separate concurrent chest CT angiogram report for details.   Electronically Signed: By: Ilona Sorrel M.D. On: 11/29/2021 12:02     Narrative & Impression  CLINICAL DATA:  Aortic valve replacement (TAVR), pre-op eval. TAVR protocol. Severe aortic stenosis.   EXAM: CT ANGIOGRAPHY CHEST, ABDOMEN AND PELVIS   TECHNIQUE: Multidetector CT imaging through the chest, abdomen and pelvis was performed using the standard protocol during bolus administration of intravenous contrast. Multiplanar reconstructed images and MIPs were obtained and reviewed to evaluate the vascular anatomy.   RADIATION DOSE REDUCTION: This exam was performed according to the departmental dose-optimization program which includes automated exposure control, adjustment of the mA and/or kV according to patient size and/or use of iterative reconstruction technique.   CONTRAST:  151m OMNIPAQUE IOHEXOL 350 MG/ML SOLN   COMPARISON:  02/01/2018 CT angiogram of the chest, abdomen and pelvis.   FINDINGS: CTA CHEST FINDINGS   Cardiovascular: Top normal heart size. Diffuse thickening and coarse calcification of the aortic valve. No significant pericardial effusion/thickening. Left anterior descending and left circumflex coronary atherosclerosis. Atherosclerotic nonaneurysmal thoracic aorta. Dilated main pulmonary artery (3.8 cm diameter). No central pulmonary emboli.   Mediastinum/Nodes: Peripherally calcified 1.4 cm right thyroid isthmus nodule, stable. Unremarkable esophagus. No axillary adenopathy. New right paratracheal adenopathy up to 1.6 cm (series 8/image 76). Newly enlarged 1.2 cm subcarinal node (series 8/image 82). Newly mildly enlarged 1.0 cm left hilar node (series 8/image 79). Newly mildly enlarged 1.1  cm right infrahilar node (series 8/image 88). Newly enlarged 2.1 cm right retrocrural node (series 8/image 116).   Lungs/Pleura:  No pneumothorax. No pleural effusion. No acute consolidative airspace disease or lung masses. Three scattered tiny solid right pulmonary nodules, largest 0.3 cm in the right middle lobe (series 9/image 57), not definitely seen on prior chest CT, which was partially obscured by atelectasis.   Musculoskeletal: No aggressive appearing focal osseous lesions. Right total shoulder arthroplasty. Scattered healed deformities in the lateral left mid ribs. Mild thoracic spondylosis.   CTA ABDOMEN AND PELVIS FINDINGS   Hepatobiliary: Normal liver with no liver mass. Cholecystectomy. No biliary ductal dilatation.   Pancreas: Normal, with no mass or duct dilation.   Spleen: Normal size. No mass.   Adrenals/Urinary Tract: Normal adrenals. Hyperdense 1.5 cm lateral upper left renal cortical lesion (series 8/image 130), increased from 1.0 cm on 02/01/2018 CT. Clustered coarse calcifications in the upper right kidney unchanged. No additional contour deforming renal lesions. No hydronephrosis. Nondistended bladder is obscured by streak artifact from left hip hardware with no gross bladder abnormality.   Stomach/Bowel: Normal non-distended stomach. Normal caliber small bowel with no small bowel wall thickening. Normal appendix. Normal large bowel with no diverticulosis, large bowel wall thickening or pericolonic fat stranding.   Vascular/Lymphatic: Atherosclerotic nonaneurysmal abdominal aorta. Enlarged left para-aortic lymph nodes, largest 1.4 cm (series 8/image 144), new. Newly enlarged 1.0 cm aortocaval node (series 8/image 144). Lobulated solid 5.5 x 3.1 cm mass in the central mesentery with a few coarse internal calcifications (series 8/image 170), increased from 4.7 x 1.8 cm on 02/01/2018 CT.   Reproductive: Grossly normal uterus.  No adnexal mass.   Other:  No pneumoperitoneum, ascites or focal fluid collection.   Musculoskeletal: No aggressive appearing focal osseous lesions. Left total hip arthroplasty. Moderate lumbar spondylosis.   VASCULAR MEASUREMENTS PERTINENT TO TAVR:   AORTA:   Minimal Aortic Diameter-14.0 x 13.6 mm   Severity of Aortic Calcification-mild   RIGHT PELVIS:   Right Common Iliac Artery -   Minimal Diameter-8.5 x 7.0 mm   Tortuosity-mild   Calcification-mild   Right External Iliac Artery -   Minimal Diameter-7.4 x 7.2 mm   Tortuosity-mild-to-moderate   Calcification-mild   Right Common Femoral Artery -   Minimal Diameter-8.6 x 7.9 mm   Tortuosity-mild   Calcification-mild   LEFT PELVIS:   Left Common Iliac Artery -   Minimal Diameter-8.9 x 8.7 mm   Tortuosity-mild   Calcification-mild   Left External Iliac Artery -   Minimal Diameter-6.4 x 6.3 mm   Tortuosity-mild-to-moderate   Calcification-mild   Left Common Femoral Artery -   Minimal Diameter-7.5 x 6.7 mm   Tortuosity-mild   Calcification-mild   Review of the MIP images confirms the above findings.   IMPRESSION: 1. Bulky central mesenteric soft tissue mass, increased since 2019 CT. New widespread mild to moderate retroperitoneal, retrocrural, mediastinal and bilateral hilar lymphadenopathy. Findings are most suggestive of lymphoma, with metastatic disease from an unknown primary not excluded. Oncology consultation and PET-CT suggested at this time. 2. Vascular findings and measurements pertinent to potential TAVR procedure, as detailed. 3. Diffusely thickened and coarsely calcified aortic valve, compatible with the reported history of severe aortic stenosis. 4. Dilated main pulmonary artery, suggesting chronic pulmonary arterial hypertension. 5. Scattered tiny solid right pulmonary nodules, largest 0.3 cm, not definitely seen on prior chest CT. Suggest attention on follow-up noncontrast chest CT in 3-6 months. 6.  Indeterminate 1.5 cm hyperdense lateral upper left renal cortical lesion, mildly increased in size since 02/01/2018 CT. MRI (preferred) or CT abdomen without and with IV contrast is indicated  for further characterization to exclude renal neoplasm. 7. Aortic Atherosclerosis (ICD10-I70.0).   These results will be called to the ordering clinician or representative by the Radiologist Assistant, and communication documented in the PACS or [employer].     Electronically Signed   By: Ilona Sorrel M.D.   On: 11/29/2021 14:38     Physicians  Panel Physicians Referring Physician Case Authorizing Physician  Burnell Blanks, MD (Primary)     Procedures  RIGHT/LEFT HEART CATH AND CORONARY ANGIOGRAPHY   Conclusion    Prox LAD lesion is 20% stenosed. Prox RCA to Mid RCA lesion is 20% stenosed. Hemodynamic findings consistent with mild pulmonary hypertension.   1. Mild non-obstructive CAD 2. Severe aortic stenosis with mean gradient of 26 mmHg, peak gradient 34 mmHg. (By echo the mean gradient is 10mHg and the leaflets are thickened with poor mobility).    Recommendations: She will need AVR. Her risk for surgical AVR seems to be low. I will make a referral to see Dr. BCyndia Bentor Dr. ORoxy Mannsin the [facility] to evaluate for surgical AVR. We will discuss her case at our valve team meeting as there is a possibility of TAVR. Findings relayed to Dr. MDomenic Polite      Indications  Severe aortic stenosis [I35.0 (ICD-10-CM)]   Procedural Details  Technical Details Indication: 71yo female with history of DM, HTN, stage three chronic kidney disease with severe aortic stenosis.  Procedure: The risks, benefits, complications, treatment options, and expected outcomes were discussed with the patient. The patient and/or family concurred with the proposed plan, giving informed consent. The patient was brought to the cath lab after IV hydration was given. The patient was sedated with  Versed and Fentanyl. The antecubital IV in the right arm was prepped and draped and change for a 5 French sheath. Right heart cath performed with a balloon tipped catheter. The right wrist was prepped and draped in a sterile fashion. 1% lidocaine was used for local anesthesia. Using the modified Seldinger access technique, a 5 French sheath was placed in the right radial artery. 3 mg Verapamil was given through the sheath. 5500 units IV heparin was given. Standard diagnostic catheters were used to perform selective coronary angiography. The RCA had a high anterior takeoff and an AL-1 catheter was used to engage this vessel. I crossed the aortic valve with an AL-1 catheter and a straight wire. LV pressures measured. The sheath was removed from the right radial artery and a Terumo hemostasis band was applied at the arteriotomy site on the right wrist.      Estimated blood loss <50 mL.  During this procedure the patient was administered the following to achieve and maintain moderate conscious sedation: Versed 2 mg, Fentanyl 50 mcg,  while the patient's heart rate, blood pressure, and oxygen saturation were continuously monitored. The period of conscious sedation was 39 minutes, of which I was present face-to-face 100% of this time.   Medications (Filter: Administrations occurring from 1206 to 1318 on 10/18/17)  important  Continuous medications are totaled by the amount administered until 10/18/17 1318.   midazolam (VERSED) injection (mg) Total dose:  2 mg  Date/Time Rate/Dose/Volume Action   10/18/17 1228 1 mg Given   1246 1 mg Given    fentaNYL (SUBLIMAZE) injection (mcg) Total dose:  50 mcg  Date/Time Rate/Dose/Volume Action   10/18/17 1228 25 mcg Given   1246 25 mcg Given    lidocaine (PF) (XYLOCAINE) 1 % injection (mL) Total volume:  4 mL  Date/Time Rate/Dose/Volume Action   10/18/17 1236 2 mL Given   1236 2 mL Given    Heparin (Porcine) in NaCl 1000-0.9 UT/500ML-% SOLN (mL) Total  volume:  1,000 mL  Date/Time Rate/Dose/Volume Action   10/18/17 1246 500 mL Given   1246 500 mL Given    Radial Cocktail/Verapamil only (mL) Total dose:  Cannot be calculated*  *Administration dose not documented Date/Time Rate/Dose/Volume Action   10/18/17 1248  Given    heparin injection (Units) Total dose:  5,500 Units  Date/Time Rate/Dose/Volume Action   10/18/17 1250 5,500 Units Given    iopamidol (ISOVUE-370) 76 % injection (mL) Total volume:  90 mL  Date/Time Rate/Dose/Volume Action   10/18/17 1310 90 mL Given    Sedation Time  Sedation Time Physician-1: 38 minutes 1 second Contrast  Medication Name Total Dose  iopamidol (ISOVUE-370) 76 % injection 90 mL   Radiation/Fluoro  Fluoro time: 8.7 (min) DAP: 00867 (mGycm2) Cumulative Air Kerma: 619 (mGy) Complications  Complications documented before study signed (10/18/2017  1:22 PM)   RIGHT/LEFT HEART CATH AND CORONARY ANGIOGRAPHY  None Documented by Burnell Blanks, MD 10/18/2017  1:19 PM  Time Range: Intraprocedure       Coronary Findings  Diagnostic Dominance: Right Left Anterior Descending  Vessel is large.  Prox LAD lesion is 20% stenosed.    First Diagonal Branch  Vessel is small in size.    Second Diagonal Branch  Vessel is moderate in size.    Left Circumflex    First Obtuse Marginal Branch  Vessel is moderate in size.    Third Obtuse Marginal Branch  Vessel is moderate in size.    Right Coronary Artery  Prox RCA to Mid RCA lesion is 20% stenosed.    Intervention   No interventions have been documented.   Right Heart  Right Heart Pressures Hemodynamic findings consistent with mild pulmonary hypertension. Elevated LV EDP consistent with volume overload.   Coronary Diagrams  Diagnostic Dominance: Right  Intervention  Implants     No implant documentation for this case.   [employer] Images   Show images for CARDIAC CATHETERIZATION Images on [employer] Term Storage    Show images for [employer], [place] R Link to Procedure Log  Procedure Log    Hemo Data  Flowsheet Row Most Recent Value  Fick Cardiac Output 7.76 L/min  Fick Cardiac Output Index 3.45 (L/min)/BSA  Aortic Mean Gradient 26 mmHg  Aortic Peak Gradient 34 mmHg  Aortic Valve Area 1.59  Aortic Value Area Index 0.71 cm2/BSA  RA A Wave 13 mmHg  RA V Wave 10 mmHg  RA Mean 7 mmHg  RV Systolic Pressure 38 mmHg  RV Diastolic Pressure 5 mmHg  RV EDP 12 mmHg  PA Systolic Pressure 40 mmHg  PA Diastolic Pressure 21 mmHg  PA Mean 30 mmHg  PW A Wave 23 mmHg  PW V Wave 24 mmHg  PW Mean 19 mmHg  AO Systolic Pressure 509 mmHg  AO Diastolic Pressure 64 mmHg  AO Mean 88 mmHg  LV Systolic Pressure 326 mmHg  LV Diastolic Pressure 8 mmHg  LV EDP 16 mmHg  AOp Systolic Pressure 712 mmHg  AOp Diastolic Pressure 62 mmHg  AOp Mean Pressure 88 mmHg  LVp Systolic Pressure 458 mmHg  LVp Diastolic Pressure 7 mmHg  LVp EDP Pressure 15 mmHg  QP/QS 1  TPVR Index 8.7 HRUI  TSVR Index 25.52 HRUI  PVR SVR Ratio 0.14  TPVR/TSVR Ratio 0.34  Impression:  This 71 year old woman has stage D, severe, symptomatic aortic stenosis with NYHA class II symptoms of mild exertional fatigue and shortness of breath consistent with chronic diastolic congestive heart failure.  I have personally reviewed her 2D echocardiogram, cardiac catheterization from 2019, and CTA studies.  Her echocardiogram shows a severely calcified and thickened aortic valve with restricted leaflet mobility.  The mean gradient has increased to 57 mmHg with a peak gradient of 90.5 mmHg and a valve area of 0.71 cm consistent with severe aortic stenosis.  There is moderate aortic insufficiency.  There is also moderate mitral valve stenosis with a mean gradient of 8 mmHg and a peak gradient of 21 mmHg.  Left ventricular ejection fraction is normal.  Her previous cardiac catheterization in 2019 showed mild nonobstructive disease in the LAD and RCA.  I agree that  aortic valve replacement is indicated in this patient for relief of her symptoms and to prevent progressive left ventricular dysfunction and sudden death.  She is now into the critical aortic stenosis range.  She also has significant calcification of her mitral annulus and mitral valve leaflets with moderate mitral stenosis.  I discussed the alternatives of open surgical aortic valve replacement and mitral valve replacement versus transcatheter aortic valve replacement.  When I saw her in 2019 I recommended open surgical aortic valve replacement due to her age of 12 and significant annular calcium extending down into the LVOT.  Her gated cardiac CTA now shows a trileaflet aortic valve with severely calcified and thickened cusps and markedly restricted mobility.  Aortic valve calcium score was 3761.  There is a bar of calcium extending below the commissure of the non and left coronary cusp into the LVOT.  Her annulus is probably suitable for a 29 mm SAPIEN 3 valve although there would be an increased risk of paravalvular leak due to the calcification extending into the LVOT.  She would really like to have TAVR if at all possible.  Her abdominal and pelvic CTA shows adequate pelvic vascular anatomy to allow transfemoral insertion.  Her abdominal and pelvic CTA also shows a large lobulated solid mass in the central mesentery with a few coarse internal calcifications which in retrospect was present in 2019 but smaller.  There are also enlarged left para-aortic and aortocaval lymph nodes as well as mediastinal adenopathy.  This is concerning for the possibility of lymphoma.  This has been present since 2019 but is much more prominent now.  I think she needs a PET scan performed preoperatively to help determine the likelihood that this is a malignant process and to assess the most accessible site for biopsy.  If there is a site for biopsy that can be performed without general anesthesia then I think that should be done  before aortic valve replacement.  If she requires general anesthesia for biopsy then I think her valve should be fixed first to decrease the risk of general anesthesia.  There was also an indeterminate 1.5 cm hyperdense lesion in the left renal cortex that has increased from 1 cm in 2019 and will need to be evaluated with MRI at some point.  There are scattered tiny solid right pulmonary nodules that would just require follow-up on further CT scanning in 6 months.  I reviewed all of the CT scan and echo images with her and her husband.  All of their questions have been answered.  If this lymphadenopathy represents a malignant process then obviously TAVR would be the best option for  her.   Plan:  She will be scheduled for a PET scan and I will see her back after that has been completed to review the results and decide on a site for biopsy.  We will discuss her case at our multidisciplinary heart valve team meeting to decide if she is a candidate for TAVR.  I spent 60 minutes performing this consultation and > 50% of this time was spent face to face counseling and coordinating the care of this patient's severe symptomatic aortic stenosis and abdominal/mediastinal lymphadenopathy.  Gaye Pollack, MD 12/07/2021

## 2021-12-09 ENCOUNTER — Telehealth: Payer: Self-pay | Admitting: Physician Assistant

## 2021-12-09 NOTE — Telephone Encounter (Signed)
Scheduled appt per 9/20 referral. Pt is aware of appt date and time. Pt is aware to arrive 15 mins prior to appt time and to bring and updated insurance card. Pt is aware of appt location.   

## 2021-12-12 NOTE — Progress Notes (Signed)
[place] [phone]   [phone]  INITIAL CONSULTATION:  Patient Care Team: Kathyrn Drown, MD as PCP - General (Family Medicine) Satira Sark, MD as PCP - Cardiology (Cardiology)  CHIEF COMPLAINTS/PURPOSE OF CONSULTATION:  Mesenteric mass with widespread lymphadenopathy  HISTORY OF PRESENTING ILLNESS:  Sabrina Deleon 71 y.o. female with medical history significant for aortic stenosis, CKD, GERD, HTN, hypothyroidism and gout. She presents to the diagnostic clinic due to recent CT imaging concerning for malignancy.   On review of the previous records, Sabrina Deleon underwent CT angiogram of the chest, abdomen and pelvis on 11/29/2021 due to history of severe aortic stenosis with TAVR preop evaluation.  Findings revealed bulky central mesenteric soft tissue mass that has increased in 2019.  Additionally there was new widespread mild to moderate retroperitoneal, retrocrural, mediastinal and bilateral hilar lymphadenopathy.  On exam today, Sabrina Deleon reports having some fatigue but stay very busy taking care of her grandkids. She has some decreased appetite lately has lost approximately 20 lbs since February 2023. She denies nausea or vomiting episodes. She has mid abdominal pain for the last few months. She rates the pain as 6-7 out of 10 on a pain scale. She reports the pain improves with Norco 5-325 mg twice daily. She denies any bowel habits changes but adds that she has chronic diarrhea. She denies easy bruising or signs of bleeding. She denies fevers, chills, sweats, shortness of breath, chest pain or cough. She has no other complaints.   MEDICAL HISTORY:  Past Medical History:  Diagnosis Date   Anxiety    Aortic stenosis    Arthritis    Chronic kidney disease    Dr. Tami Ribas- noted increase kidney function levels- took pt off Metformin and started on Onglyza for Blood sugar control-pt being followed for this.   GERD  (gastroesophageal reflux disease)    Gout    Hypertension    Hypothyroidism    Morbid obesity ([place]) 03/29/2018   Patient has elevated BMI with diabetes hyperlipidemia and hypertension   Pancreatitis    Type 2 diabetes mellitus ([place])    Wears glasses    Wears partial dentures    Upper    SURGICAL HISTORY: Past Surgical History:  Procedure Laterality Date   BACK SURGERY     Lumbar   CARPAL TUNNEL RELEASE  11/02/2011   Procedure: CARPAL TUNNEL RELEASE;  Surgeon: Tennis Must, MD;  Location: [place];  Service: Orthopedics;  Laterality: Right;   CARPAL TUNNEL RELEASE Left 01/14/2015   Procedure: LEFT CARPAL TUNNEL RELEASE;  Surgeon: Leanora Cover, MD;  Location: [place];  Service: Orthopedics;  Laterality: Left;   CHOLECYSTECTOMY     laparoscopic   COLONOSCOPY     COLONOSCOPY N/A 11/12/2015   Procedure: COLONOSCOPY;  Surgeon: Rogene Houston, MD;  Location: [facility];  Service: Endoscopy;  Laterality: N/A;  9:30   ESOPHAGOGASTRODUODENOSCOPY N/A 11/12/2015   Procedure: ESOPHAGOGASTRODUODENOSCOPY (EGD);  Surgeon: Rogene Houston, MD;  Location: [facility];  Service: Endoscopy;  Laterality: N/A;   REVERSE SHOULDER ARTHROPLASTY Right 11/04/2018   REVERSE SHOULDER ARTHROPLASTY Right 11/04/2018   Procedure: REVERSE SHOULDER ARTHROPLASTY;  Surgeon: Verner Mould, MD;  Location: [facility];  Service: Orthopedics;  Laterality: Right;   RIGHT/LEFT HEART CATH AND CORONARY ANGIOGRAPHY N/A 10/18/2017   Procedure: RIGHT/LEFT HEART CATH AND CORONARY ANGIOGRAPHY;  Surgeon: Burnell Blanks, MD;  Location: [facility];  Service: Cardiovascular;  Laterality: N/A;   TOTAL HIP ARTHROPLASTY Left 03/22/2016   Procedure: LEFT TOTAL HIP ARTHROPLASTY ANTERIOR APPROACH;  Surgeon: Gaynelle Arabian, MD;  Location: [facility];  Service: Orthopedics;  Laterality: Left;    SOCIAL HISTORY: Social History   Socioeconomic History   Marital status: Married    Spouse  name: Not on file   Number of children: Not on file   Years of education: Not on file   Highest education level: Not on file  Occupational History   Not on file  Tobacco Use   Smoking status: Former    Types: Cigarettes    Quit date: 10/29/1981    Years since quitting: 40.1   Smokeless tobacco: Never  Vaping Use   Vaping Use: Never used  Substance and Sexual Activity   Alcohol use: Yes    Comment: occasionally   Drug use: No   Sexual activity: Not Currently  Other Topics Concern   Not on file  Social History Narrative   Not on file   Social Determinants of Health   Financial Resource Strain: Not on file  Food Insecurity: Not on file  Transportation Needs: Not on file  Physical Activity: Not on file  Stress: Not on file  Social Connections: Not on file  Intimate Partner Violence: Not on file    FAMILY HISTORY: No family history on file.  ALLERGIES:  has No Known Allergies.  MEDICATIONS:  Current Outpatient Medications  Medication Sig Dispense Refill   allopurinol (ZYLOPRIM) 100 MG tablet TAKE 1 TABLET(100 MG) BY MOUTH TWICE DAILY 180 tablet 1   aspirin EC 81 MG tablet Take 81 mg by mouth daily.     Cholecalciferol (VITAMIN D PO) Take 2,000 Units by mouth daily.     citalopram (CELEXA) 20 MG tablet TAKE 1 AND 1/2 TABLETS BY MOUTH EVERY DAY 135 tablet 1   ferrous sulfate 325 (65 FE) MG tablet Take 325 mg by mouth daily with breakfast.     furosemide (LASIX) 40 MG tablet TAKE 1 TABLET(40 MG) BY MOUTH DAILY 90 tablet 1   HYDROcodone-acetaminophen (NORCO/VICODIN) 5-325 MG tablet Take one tab po BID prn pain 45 tablet 0   HYDROcodone-acetaminophen (NORCO/VICODIN) 5-325 MG tablet Take 1 tab po BID prn pain 45 tablet 0   levothyroxine (SYNTHROID) 125 MCG tablet Take 1 tablet (125 mcg total) by mouth daily before breakfast. 90 tablet 1   lisinopril (ZESTRIL) 20 MG tablet Take 10 mg by mouth daily.     MISC NATURAL PRODUCTS PO Take 1 tablet by mouth daily. Beet Root Chewable      Multiple Vitamin (MULTIVITAMIN WITH MINERALS) TABS tablet Take 1 tablet by mouth daily.     nitrofurantoin, macrocrystal-monohydrate, (MACROBID) 100 MG capsule Take 1 capsule (100 mg total) by mouth 2 (two) times daily. 10 capsule 0   ONETOUCH ULTRA test strip TEST ONCE DAILY 50 strip 5   pantoprazole (PROTONIX) 40 MG tablet TAKE 1 TABLET(40 MG) BY MOUTH DAILY (Patient taking differently: 40 mg every other day.) 90 tablet 1   potassium chloride (KLOR-CON M) 10 MEQ tablet Take 1 tablet by mouth on Monday, Wednesday and Friday 14 tablet 6   rosuvastatin (CRESTOR) 10 MG tablet TAKE 1 TABLET(10 MG) BY MOUTH DAILY FOR CHOLESTEROL. STOP WHILE TAKING COLCHICINE FOR GOUT 90 tablet 1   triamcinolone cream (KENALOG) 0.1 % Apply 1 application  topically 2 (two) times daily. prn     zolpidem (AMBIEN) 5 MG tablet Take 1 tablet (5 mg  total) by mouth at bedtime. 30 tablet 3   No current facility-administered medications for this visit.    REVIEW OF SYSTEMS:   Constitutional: ( - ) fevers, ( - )  chills , ( - ) night sweats Eyes: ( - ) blurriness of vision, ( - ) double vision, ( - ) watery eyes Ears, nose, mouth, throat, and face: ( - ) mucositis, ( - ) sore throat Respiratory: ( - ) cough, ( - ) dyspnea, ( - ) wheezes Cardiovascular: ( - ) palpitation, ( - ) chest discomfort, ( - ) lower extremity swelling Gastrointestinal:  ( - ) nausea, ( - ) heartburn, ( - ) change in bowel habits Skin: ( - ) abnormal skin rashes Lymphatics: ( - ) new lymphadenopathy, ( - ) easy bruising Neurological: ( - ) numbness, ( - ) tingling, ( - ) new weaknesses Behavioral/Psych: ( - ) mood change, ( - ) new changes  All other systems were reviewed with the patient and are negative.  PHYSICAL EXAMINATION: ECOG PERFORMANCE STATUS: 1 - Symptomatic but completely ambulatory  There were no vitals filed for this visit. There were no vitals filed for this visit.  GENERAL: well appearing female in NAD  SKIN: skin color,  texture, turgor are normal, no rashes or significant lesions EYES: conjunctiva are pink and non-injected, sclera clear OROPHARYNX: no exudate, no erythema; lips, buccal mucosa, and tongue normal  NECK: supple, non-tender LYMPH:  no palpable lymphadenopathy in the cervical  or supraclavicular lymph nodes.  LUNGS: clear to auscultation and percussion with normal breathing effort HEART: regular rate & rhythm and no murmurs and no lower extremity edema ABDOMEN: soft, non-distended, normal bowel sounds. Tenderness to palpation in epigastric region. Musculoskeletal: no cyanosis of digits and no clubbing  PSYCH: alert & oriented x 3, fluent speech NEURO: no focal motor/sensory deficits  LABORATORY DATA:  I have reviewed the data as listed    Latest Ref Rng & Units 05/30/2021   10:15 AM 11/23/2020   10:19 AM 12/05/2019   11:45 AM  CBC  WBC 3.4 - 10.8 x10E3/uL 5.9  6.4  7.3   Hemoglobin 11.1 - 15.9 g/dL 10.1  10.5  11.5   Hematocrit 34.0 - 46.6 % 31.1  31.9  34.2   Platelets 150 - 450 x10E3/uL 211  171  186        Latest Ref Rng & Units 11/15/2021   11:09 AM 05/30/2021   10:15 AM 02/25/2021   11:10 AM  CMP  Glucose 70 - 99 mg/dL 110  116    BUN 8 - 27 mg/dL 18  15    Creatinine 0.57 - 1.00 mg/dL 1.13  1.08    Sodium 134 - 144 mmol/L 136  142    Potassium 3.5 - 5.2 mmol/L 3.9  3.4  4.7   Chloride 96 - 106 mmol/L 99  102    CO2 20 - 29 mmol/L 24  25    Calcium 8.7 - 10.3 mg/dL 10.5  9.8    Total Protein 6.0 - 8.5 g/dL  7.0    Total Bilirubin 0.0 - 1.2 mg/dL  0.4    Alkaline Phos 44 - 121 IU/L  80    AST 0 - 40 IU/L  31    ALT 0 - 32 IU/L  11       RADIOGRAPHIC STUDIES: I have personally reviewed the radiological images as listed and agreed with the findings in the report. CT CORONARY MORPH W/CTA COR  W/SCORE W/CA W/CM &/OR WO/CM  Addendum Date: 12/05/2021   ADDENDUM REPORT: 12/05/2021 09:03 CLINICAL DATA:  Aortic valve replacement (TAVR), pre-op eval EXAM: Cardiac TAVR CT TECHNIQUE:  The patient was scanned on a Siemens Force 081 slice scanner. A 120 kV retrospective scan was triggered in the descending thoracic aorta at 111 HU's. Gantry rotation speed was 270 msecs and collimation was .9 mm. The 3D data set was reconstructed in 5% intervals of the R-R cycle. Systolic and diastolic phases were analyzed on a dedicated work station using MPR, MIP and VRT modes. The patient received 165m OMNIPAQUE IOHEXOL 350 MG/ML SOLN of contrast. FINDINGS: Aortic Valve: Tricuspid aortic valve. Severely reduced cusp separation. Severely thickened, severely calcified aortic valve cusps. AV calcium score: 3761 Virtual Basal Annulus Measurements: Maximum/Minimum Diameter: 30.4 x 22.9 mm Perimeter: 84.5 mm Area:  540 mm2 LVOT calcifications below commissure of non- and left coronary cusps. Membranous septal length: 5.5 mm Based on these measurements, the annulus would be suitable for a 29 mm Sapien 3 valve. Measurements are borderline for 26 mm Sapien 3 valve, recommend heart team discussion for valve sizing. Based on measurements, can also consider 34 mm Medtronic Evolut valve. Sinus of Valsalva Measurements: Non-coronary:  32 mm Right - coronary:  31 mm Left - coronary:  31 mm Sinus of Valsalva Height: Left: 22.6 mm Right: 18.3 mm Aorta: Normal variant 4 vessel branch pattern of aortic arch, left vertebral artery originates off aortic arch. Sinotubular Junction: 28.5 mm Ascending Thoracic Aorta:  35 mm Aortic Arch:  26 mm Descending Thoracic Aorta not fully visualized, measurement made at proximal descending aorta: 28 mm Coronary Artery Height above Annulus: Left main: 16.8 mm Right coronary: 20 mm Coronary Arteries: Normal coronary origin. Right dominance. The study was performed without use of NTG and insufficient for plaque evaluation. Coronary artery calcium seen in left coronary system distribution. Optimum Fluoroscopic Angle for Delivery: LAO 8 CAU 7 OTHER: Atria: Biatrial chamber dilation Left atrial  appendage: No thrombus. Mitral valve: moderate mitral annular calcifications. Pulmonary artery: Mild dilation, 31 mm. Pulmonary veins: Normal anatomy. IMPRESSION: 1. Tricuspid aortic valve with severely reduced cusp excursion. Severely thickened and severely calcified aortic valve cusps. 2.  Aortic valve calcium score: 3761 3. Annulus area: 540 mm2, suitable for 29 mm Sapien 3 valve. LVOT calcifications below commissure of non- and left coronary cusps. Recommend Heart Team discussion for valve sizing. 4.  Sufficient coronary artery heights from annulus. 5.  Optimum fluoroscopic angle for delivery: LAO 8 CAU 7 Electronically Signed   By: GCherlynn KaiserM.D.   On: 12/05/2021 09:03   Result Date: 12/05/2021 EXAM: OVER-READ INTERPRETATION  CT CHEST The following report is a limited chest CT over-read performed by radiologist Dr. [facility]Salvatore Marvelof [facility] [place] 11/29/2021. This over-read does not include interpretation of cardiac or coronary anatomy or pathology. The cardiac CTA interpretation by the cardiologist is attached. COMPARISON:  02/01/2018 chest CT angiogram. FINDINGS: Please see the separate concurrent chest CT angiogram report for details. IMPRESSION: Please see the separate concurrent chest CT angiogram report for details. Electronically Signed: By: JIlona SorrelM.D. On: 11/29/2021 12:02   CT ANGIO ABDOMEN PELVIS  W &/OR WO CONTRAST  Result Date: 11/29/2021 CLINICAL DATA:  Aortic valve replacement (TAVR), pre-op eval. TAVR protocol. Severe aortic stenosis. EXAM: CT ANGIOGRAPHY CHEST, ABDOMEN AND PELVIS TECHNIQUE: Multidetector CT imaging through the chest, abdomen and pelvis was performed using the standard protocol during bolus administration of intravenous contrast. Multiplanar reconstructed images  and MIPs were obtained and reviewed to evaluate the vascular anatomy. RADIATION DOSE REDUCTION: This exam was performed according to the departmental dose-optimization program which includes  automated exposure control, adjustment of the mA and/or kV according to patient size and/or use of iterative reconstruction technique. CONTRAST:  168m OMNIPAQUE IOHEXOL 350 MG/ML SOLN COMPARISON:  02/01/2018 CT angiogram of the chest, abdomen and pelvis. FINDINGS: CTA CHEST FINDINGS Cardiovascular: Top normal heart size. Diffuse thickening and coarse calcification of the aortic valve. No significant pericardial effusion/thickening. Left anterior descending and left circumflex coronary atherosclerosis. Atherosclerotic nonaneurysmal thoracic aorta. Dilated main pulmonary artery (3.8 cm diameter). No central pulmonary emboli. Mediastinum/Nodes: Peripherally calcified 1.4 cm right thyroid isthmus nodule, stable. Unremarkable esophagus. No axillary adenopathy. New right paratracheal adenopathy up to 1.6 cm (series 8/image 76). Newly enlarged 1.2 cm subcarinal node (series 8/image 82). Newly mildly enlarged 1.0 cm left hilar node (series 8/image 79). Newly mildly enlarged 1.1 cm right infrahilar node (series 8/image 88). Newly enlarged 2.1 cm right retrocrural node (series 8/image 116). Lungs/Pleura: No pneumothorax. No pleural effusion. No acute consolidative airspace disease or lung masses. Three scattered tiny solid right pulmonary nodules, largest 0.3 cm in the right middle lobe (series 9/image 57), not definitely seen on prior chest CT, which was partially obscured by atelectasis. Musculoskeletal: No aggressive appearing focal osseous lesions. Right total shoulder arthroplasty. Scattered healed deformities in the lateral left mid ribs. Mild thoracic spondylosis. CTA ABDOMEN AND PELVIS FINDINGS Hepatobiliary: Normal liver with no liver mass. Cholecystectomy. No biliary ductal dilatation. Pancreas: Normal, with no mass or duct dilation. Spleen: Normal size. No mass. Adrenals/Urinary Tract: Normal adrenals. Hyperdense 1.5 cm lateral upper left renal cortical lesion (series 8/image 130), increased from 1.0 cm on  02/01/2018 CT. Clustered coarse calcifications in the upper right kidney unchanged. No additional contour deforming renal lesions. No hydronephrosis. Nondistended bladder is obscured by streak artifact from left hip hardware with no gross bladder abnormality. Stomach/Bowel: Normal non-distended stomach. Normal caliber small bowel with no small bowel wall thickening. Normal appendix. Normal large bowel with no diverticulosis, large bowel wall thickening or pericolonic fat stranding. Vascular/Lymphatic: Atherosclerotic nonaneurysmal abdominal aorta. Enlarged left para-aortic lymph nodes, largest 1.4 cm (series 8/image 144), new. Newly enlarged 1.0 cm aortocaval node (series 8/image 144). Lobulated solid 5.5 x 3.1 cm mass in the central mesentery with a few coarse internal calcifications (series 8/image 170), increased from 4.7 x 1.8 cm on 02/01/2018 CT. Reproductive: Grossly normal uterus.  No adnexal mass. Other: No pneumoperitoneum, ascites or focal fluid collection. Musculoskeletal: No aggressive appearing focal osseous lesions. Left total hip arthroplasty. Moderate lumbar spondylosis. VASCULAR MEASUREMENTS PERTINENT TO TAVR: AORTA: Minimal Aortic Diameter-14.0 x 13.6 mm Severity of Aortic Calcification-mild RIGHT PELVIS: Right Common Iliac Artery - Minimal Diameter-8.5 x 7.0 mm Tortuosity-mild Calcification-mild Right External Iliac Artery - Minimal Diameter-7.4 x 7.2 mm Tortuosity-mild-to-moderate Calcification-mild Right Common Femoral Artery - Minimal Diameter-8.6 x 7.9 mm Tortuosity-mild Calcification-mild LEFT PELVIS: Left Common Iliac Artery - Minimal Diameter-8.9 x 8.7 mm Tortuosity-mild Calcification-mild Left External Iliac Artery - Minimal Diameter-6.4 x 6.3 mm Tortuosity-mild-to-moderate Calcification-mild Left Common Femoral Artery - Minimal Diameter-7.5 x 6.7 mm Tortuosity-mild Calcification-mild Review of the MIP images confirms the above findings. IMPRESSION: 1. Bulky central mesenteric soft tissue  mass, increased since 2019 CT. New widespread mild to moderate retroperitoneal, retrocrural, mediastinal and bilateral hilar lymphadenopathy. Findings are most suggestive of lymphoma, with metastatic disease from an unknown primary not excluded. Oncology consultation and PET-CT suggested at this time. 2. Vascular findings and  measurements pertinent to potential TAVR procedure, as detailed. 3. Diffusely thickened and coarsely calcified aortic valve, compatible with the reported history of severe aortic stenosis. 4. Dilated main pulmonary artery, suggesting chronic pulmonary arterial hypertension. 5. Scattered tiny solid right pulmonary nodules, largest 0.3 cm, not definitely seen on prior chest CT. Suggest attention on follow-up noncontrast chest CT in 3-6 months. 6. Indeterminate 1.5 cm hyperdense lateral upper left renal cortical lesion, mildly increased in size since 02/01/2018 CT. MRI (preferred) or CT abdomen without and with IV contrast is indicated for further characterization to exclude renal neoplasm. 7. Aortic Atherosclerosis (ICD10-I70.0). These results will be called to the ordering clinician or representative by the Radiologist Assistant, and communication documented in the PACS or [employer]. Electronically Signed   By: Ilona Sorrel M.D.   On: 11/29/2021 14:38   CT ANGIO CHEST AORTA W/CM & OR WO/CM  Result Date: 11/29/2021 CLINICAL DATA:  Aortic valve replacement (TAVR), pre-op eval. TAVR protocol. Severe aortic stenosis. EXAM: CT ANGIOGRAPHY CHEST, ABDOMEN AND PELVIS TECHNIQUE: Multidetector CT imaging through the chest, abdomen and pelvis was performed using the standard protocol during bolus administration of intravenous contrast. Multiplanar reconstructed images and MIPs were obtained and reviewed to evaluate the vascular anatomy. RADIATION DOSE REDUCTION: This exam was performed according to the departmental dose-optimization program which includes automated exposure control, adjustment  of the mA and/or kV according to patient size and/or use of iterative reconstruction technique. CONTRAST:  113m OMNIPAQUE IOHEXOL 350 MG/ML SOLN COMPARISON:  02/01/2018 CT angiogram of the chest, abdomen and pelvis. FINDINGS: CTA CHEST FINDINGS Cardiovascular: Top normal heart size. Diffuse thickening and coarse calcification of the aortic valve. No significant pericardial effusion/thickening. Left anterior descending and left circumflex coronary atherosclerosis. Atherosclerotic nonaneurysmal thoracic aorta. Dilated main pulmonary artery (3.8 cm diameter). No central pulmonary emboli. Mediastinum/Nodes: Peripherally calcified 1.4 cm right thyroid isthmus nodule, stable. Unremarkable esophagus. No axillary adenopathy. New right paratracheal adenopathy up to 1.6 cm (series 8/image 76). Newly enlarged 1.2 cm subcarinal node (series 8/image 82). Newly mildly enlarged 1.0 cm left hilar node (series 8/image 79). Newly mildly enlarged 1.1 cm right infrahilar node (series 8/image 88). Newly enlarged 2.1 cm right retrocrural node (series 8/image 116). Lungs/Pleura: No pneumothorax. No pleural effusion. No acute consolidative airspace disease or lung masses. Three scattered tiny solid right pulmonary nodules, largest 0.3 cm in the right middle lobe (series 9/image 57), not definitely seen on prior chest CT, which was partially obscured by atelectasis. Musculoskeletal: No aggressive appearing focal osseous lesions. Right total shoulder arthroplasty. Scattered healed deformities in the lateral left mid ribs. Mild thoracic spondylosis. CTA ABDOMEN AND PELVIS FINDINGS Hepatobiliary: Normal liver with no liver mass. Cholecystectomy. No biliary ductal dilatation. Pancreas: Normal, with no mass or duct dilation. Spleen: Normal size. No mass. Adrenals/Urinary Tract: Normal adrenals. Hyperdense 1.5 cm lateral upper left renal cortical lesion (series 8/image 130), increased from 1.0 cm on 02/01/2018 CT. Clustered coarse calcifications  in the upper right kidney unchanged. No additional contour deforming renal lesions. No hydronephrosis. Nondistended bladder is obscured by streak artifact from left hip hardware with no gross bladder abnormality. Stomach/Bowel: Normal non-distended stomach. Normal caliber small bowel with no small bowel wall thickening. Normal appendix. Normal large bowel with no diverticulosis, large bowel wall thickening or pericolonic fat stranding. Vascular/Lymphatic: Atherosclerotic nonaneurysmal abdominal aorta. Enlarged left para-aortic lymph nodes, largest 1.4 cm (series 8/image 144), new. Newly enlarged 1.0 cm aortocaval node (series 8/image 144). Lobulated solid 5.5 x 3.1 cm mass in the central mesentery  with a few coarse internal calcifications (series 8/image 170), increased from 4.7 x 1.8 cm on 02/01/2018 CT. Reproductive: Grossly normal uterus.  No adnexal mass. Other: No pneumoperitoneum, ascites or focal fluid collection. Musculoskeletal: No aggressive appearing focal osseous lesions. Left total hip arthroplasty. Moderate lumbar spondylosis. VASCULAR MEASUREMENTS PERTINENT TO TAVR: AORTA: Minimal Aortic Diameter-14.0 x 13.6 mm Severity of Aortic Calcification-mild RIGHT PELVIS: Right Common Iliac Artery - Minimal Diameter-8.5 x 7.0 mm Tortuosity-mild Calcification-mild Right External Iliac Artery - Minimal Diameter-7.4 x 7.2 mm Tortuosity-mild-to-moderate Calcification-mild Right Common Femoral Artery - Minimal Diameter-8.6 x 7.9 mm Tortuosity-mild Calcification-mild LEFT PELVIS: Left Common Iliac Artery - Minimal Diameter-8.9 x 8.7 mm Tortuosity-mild Calcification-mild Left External Iliac Artery - Minimal Diameter-6.4 x 6.3 mm Tortuosity-mild-to-moderate Calcification-mild Left Common Femoral Artery - Minimal Diameter-7.5 x 6.7 mm Tortuosity-mild Calcification-mild Review of the MIP images confirms the above findings. IMPRESSION: 1. Bulky central mesenteric soft tissue mass, increased since 2019 CT. New widespread  mild to moderate retroperitoneal, retrocrural, mediastinal and bilateral hilar lymphadenopathy. Findings are most suggestive of lymphoma, with metastatic disease from an unknown primary not excluded. Oncology consultation and PET-CT suggested at this time. 2. Vascular findings and measurements pertinent to potential TAVR procedure, as detailed. 3. Diffusely thickened and coarsely calcified aortic valve, compatible with the reported history of severe aortic stenosis. 4. Dilated main pulmonary artery, suggesting chronic pulmonary arterial hypertension. 5. Scattered tiny solid right pulmonary nodules, largest 0.3 cm, not definitely seen on prior chest CT. Suggest attention on follow-up noncontrast chest CT in 3-6 months. 6. Indeterminate 1.5 cm hyperdense lateral upper left renal cortical lesion, mildly increased in size since 02/01/2018 CT. MRI (preferred) or CT abdomen without and with IV contrast is indicated for further characterization to exclude renal neoplasm. 7. Aortic Atherosclerosis (ICD10-I70.0). These results will be called to the ordering clinician or representative by the Radiologist Assistant, and communication documented in the PACS or [employer]. Electronically Signed   By: Ilona Sorrel M.D.   On: 11/29/2021 14:38    ASSESSMENT & PLAN Sabrina Deleon is a 71 y.o. female who presents to the clinic for evaluation for recent CT imaging that is concerning for a mesenteric mass and diffuse adenopathy. She is scheduled for a PET/CT scan later this week to further evaluate CT findings. Based on the PET/CT scan results, we will arrange for biopsy for tissue confirmation. She will proceed with laboratory evaluation today to check CBC, CMP, LDH, ESR, CRP and flow cytometry. Patient will return to the clinic once workup is complete to discuss next steps.   #Mesenteric Mass with diffuse adenopathy: --Labs today to check CBC, CMP, LDH, ESR, CRP and flow cytometry --PET scan scheduled for  12/15/2021 --Need tissue biopsy, likely one of the enlarged lymph nodes.  --RTC once workup is complete  No orders of the defined types were placed in this encounter.   All questions were answered. The patient knows to call the clinic with any problems, questions or concerns.  I have spent a total of 60 minutes minutes of face-to-face and non-face-to-face time, preparing to see the patient, obtaining and/or reviewing separately obtained history, performing a medically appropriate examination, counseling and educating the patient, ordering tests/procedures,  documenting clinical information in the electronic health record, and care coordination.   Dede Query, PA-C Department of Hematology/Oncology [place] at [facility] Phone: [phone]  Patient was seen with Dr. Lorenso Courier  I have read the above note and personally examined the patient. I agree with the  assessment and plan as noted above.  Briefly Sabrina Deleon presents for evaluation of mesenteric mass with widespread lymphadenopathy. At this time the etiology of her lymphadenopathy. PET CT scan is scheduled for 12/15/2021.  We will also request an IR guided biopsy in order to determine the etiology of this mass in the lymphadenopathy.  At this time suspicion for lymphoma versus carcinoid tumor.  The patient voiced understanding of the findings and the plan moving forward.   Ledell Peoples, MD Department of Hematology/Oncology [place] at [facility] Phone: [phone] Pager: [phone] Email: [email or web address]

## 2021-12-13 ENCOUNTER — Inpatient Hospital Stay: Payer: Medicare Other | Attending: Physician Assistant | Admitting: Physician Assistant

## 2021-12-13 ENCOUNTER — Inpatient Hospital Stay: Payer: Medicare Other

## 2021-12-13 ENCOUNTER — Encounter: Payer: Self-pay | Admitting: Physician Assistant

## 2021-12-13 VITALS — BP 106/82 | HR 64 | Temp 97.7°F | Resp 16 | Ht 68.0 in | Wt 169.7 lb

## 2021-12-13 DIAGNOSIS — E039 Hypothyroidism, unspecified: Secondary | ICD-10-CM | POA: Insufficient documentation

## 2021-12-13 DIAGNOSIS — K6389 Other specified diseases of intestine: Secondary | ICD-10-CM | POA: Insufficient documentation

## 2021-12-13 DIAGNOSIS — N189 Chronic kidney disease, unspecified: Secondary | ICD-10-CM | POA: Diagnosis not present

## 2021-12-13 DIAGNOSIS — R591 Generalized enlarged lymph nodes: Secondary | ICD-10-CM | POA: Insufficient documentation

## 2021-12-13 DIAGNOSIS — R1900 Intra-abdominal and pelvic swelling, mass and lump, unspecified site: Secondary | ICD-10-CM | POA: Diagnosis not present

## 2021-12-13 DIAGNOSIS — I129 Hypertensive chronic kidney disease with stage 1 through stage 4 chronic kidney disease, or unspecified chronic kidney disease: Secondary | ICD-10-CM | POA: Diagnosis not present

## 2021-12-13 DIAGNOSIS — E1122 Type 2 diabetes mellitus with diabetic chronic kidney disease: Secondary | ICD-10-CM | POA: Diagnosis not present

## 2021-12-13 LAB — CMP (CANCER CENTER ONLY)
ALT: 19 U/L (ref 0–44)
AST: 36 U/L (ref 15–41)
Albumin: 4.2 g/dL (ref 3.5–5.0)
Alkaline Phosphatase: 53 U/L (ref 38–126)
Anion gap: 3 — ABNORMAL LOW (ref 5–15)
BUN: 26 mg/dL — ABNORMAL HIGH (ref 8–23)
CO2: 30 mmol/L (ref 22–32)
Calcium: 10 mg/dL (ref 8.9–10.3)
Chloride: 108 mmol/L (ref 98–111)
Creatinine: 1.17 mg/dL — ABNORMAL HIGH (ref 0.44–1.00)
GFR, Estimated: 50 mL/min — ABNORMAL LOW (ref >60)
Glucose, Bld: 105 mg/dL — ABNORMAL HIGH (ref 70–99)
Potassium: 3.8 mmol/L (ref 3.5–5.1)
Sodium: 141 mmol/L (ref 135–145)
Total Bilirubin: 0.7 mg/dL (ref 0.3–1.2)
Total Protein: 7.2 g/dL (ref 6.5–8.1)

## 2021-12-13 LAB — CBC WITH DIFFERENTIAL (CANCER CENTER ONLY)
Abs Immature Granulocytes: 0.01 10*3/uL (ref 0.00–0.07)
Basophils Absolute: 0 10*3/uL (ref 0.0–0.1)
Basophils Relative: 1 %
Eosinophils Absolute: 0.5 10*3/uL (ref 0.0–0.5)
Eosinophils Relative: 8 %
HCT: 31.3 % — ABNORMAL LOW (ref 36.0–46.0)
Hemoglobin: 10.3 g/dL — ABNORMAL LOW (ref 12.0–15.0)
Immature Granulocytes: 0 %
Lymphocytes Relative: 25 %
Lymphs Abs: 1.5 10*3/uL (ref 0.7–4.0)
MCH: 30.5 pg (ref 26.0–34.0)
MCHC: 32.9 g/dL (ref 30.0–36.0)
MCV: 92.6 fL (ref 80.0–100.0)
Monocytes Absolute: 0.5 10*3/uL (ref 0.1–1.0)
Monocytes Relative: 8 %
Neutro Abs: 3.4 10*3/uL (ref 1.7–7.7)
Neutrophils Relative %: 58 %
Platelet Count: 148 10*3/uL — ABNORMAL LOW (ref 150–400)
RBC: 3.38 MIL/uL — ABNORMAL LOW (ref 3.87–5.11)
RDW: 13.8 % (ref 11.5–15.5)
WBC Count: 5.8 10*3/uL (ref 4.0–10.5)
nRBC: 0 % (ref 0.0–0.2)

## 2021-12-13 LAB — C-REACTIVE PROTEIN: CRP: 0.5 mg/dL (ref <1.0)

## 2021-12-13 LAB — LACTATE DEHYDROGENASE: LDH: 137 U/L (ref 98–192)

## 2021-12-13 LAB — SEDIMENTATION RATE: Sed Rate: 20 mm/hr (ref 0–22)

## 2021-12-15 ENCOUNTER — Encounter (HOSPITAL_COMMUNITY)
Admission: RE | Admit: 2021-12-15 | Discharge: 2021-12-15 | Disposition: A | Discharge status: Home / Self Care | Payer: Medicare Other | Source: Ambulatory Visit | Attending: Surgery | Admitting: Surgery

## 2021-12-15 DIAGNOSIS — I700 Atherosclerosis of aorta: Secondary | ICD-10-CM | POA: Diagnosis not present

## 2021-12-15 DIAGNOSIS — N2889 Other specified disorders of kidney and ureter: Secondary | ICD-10-CM | POA: Insufficient documentation

## 2021-12-15 DIAGNOSIS — I2510 Atherosclerotic heart disease of native coronary artery without angina pectoris: Secondary | ICD-10-CM | POA: Diagnosis not present

## 2021-12-15 DIAGNOSIS — S2242XA Multiple fractures of ribs, left side, initial encounter for closed fracture: Secondary | ICD-10-CM | POA: Diagnosis not present

## 2021-12-15 DIAGNOSIS — R935 Abnormal findings on diagnostic imaging of other abdominal regions, including retroperitoneum: Secondary | ICD-10-CM | POA: Insufficient documentation

## 2021-12-15 DIAGNOSIS — N289 Disorder of kidney and ureter, unspecified: Secondary | ICD-10-CM | POA: Diagnosis not present

## 2021-12-15 DIAGNOSIS — R590 Localized enlarged lymph nodes: Secondary | ICD-10-CM | POA: Diagnosis not present

## 2021-12-15 DIAGNOSIS — M7989 Other specified soft tissue disorders: Secondary | ICD-10-CM | POA: Diagnosis not present

## 2021-12-15 LAB — GLUCOSE, CAPILLARY: Glucose-Capillary: 99 mg/dL (ref 70–99)

## 2021-12-15 MED ORDER — FLUDEOXYGLUCOSE F - 18 (FDG) INJECTION
8.4000 | Freq: Once | INTRAVENOUS | Status: AC
  Administered 2021-12-15: 8.4 via INTRAVENOUS

## 2021-12-19 ENCOUNTER — Other Ambulatory Visit: Payer: Self-pay | Admitting: Physician Assistant

## 2021-12-19 ENCOUNTER — Telehealth: Payer: Self-pay | Admitting: Physician Assistant

## 2021-12-19 DIAGNOSIS — K6389 Other specified diseases of intestine: Secondary | ICD-10-CM

## 2021-12-19 NOTE — Telephone Encounter (Signed)
Tried to reach patient to review PET scan results and recommendations on biopsy as next step in diagnostic workup. Unable to reach patient. Left voicemail with details. Provided number to call back at [phone].

## 2021-12-19 NOTE — Progress Notes (Unsigned)
Sandi Mariscal, MD  Donita Brooks D OK for CT guided mesenteric mass Bx.   PET CT - 12/15/2021 - image 135, series 4.   IF not anterior approach window, could then consider the L RP LN on image 119, series 4.   Cathren Harsh

## 2021-12-23 ENCOUNTER — Encounter: Payer: Medicare Other | Admitting: Surgery

## 2021-12-26 ENCOUNTER — Other Ambulatory Visit: Payer: Self-pay | Admitting: Family Medicine

## 2021-12-27 ENCOUNTER — Other Ambulatory Visit (HOSPITAL_COMMUNITY): Payer: Self-pay | Admitting: Radiology

## 2021-12-27 DIAGNOSIS — K6389 Other specified diseases of intestine: Secondary | ICD-10-CM

## 2021-12-28 ENCOUNTER — Other Ambulatory Visit: Payer: Self-pay

## 2021-12-28 ENCOUNTER — Ambulatory Visit (HOSPITAL_COMMUNITY)
Admission: RE | Admit: 2021-12-28 | Discharge: 2021-12-28 | Disposition: A | Discharge status: Home / Self Care | Payer: Medicare Other | Source: Ambulatory Visit | Attending: Physician Assistant | Admitting: Physician Assistant

## 2021-12-28 DIAGNOSIS — Z87891 Personal history of nicotine dependence: Secondary | ICD-10-CM | POA: Diagnosis not present

## 2021-12-28 DIAGNOSIS — Z7982 Long term (current) use of aspirin: Secondary | ICD-10-CM | POA: Insufficient documentation

## 2021-12-28 DIAGNOSIS — D3A8 Other benign neuroendocrine tumors: Secondary | ICD-10-CM | POA: Insufficient documentation

## 2021-12-28 DIAGNOSIS — K6389 Other specified diseases of intestine: Secondary | ICD-10-CM | POA: Diagnosis present

## 2021-12-28 DIAGNOSIS — R1909 Other intra-abdominal and pelvic swelling, mass and lump: Secondary | ICD-10-CM | POA: Diagnosis not present

## 2021-12-28 LAB — CBC
HCT: 33.1 % — ABNORMAL LOW (ref 36.0–46.0)
Hemoglobin: 11 g/dL — ABNORMAL LOW (ref 12.0–15.0)
MCH: 30.7 pg (ref 26.0–34.0)
MCHC: 33.2 g/dL (ref 30.0–36.0)
MCV: 92.5 fL (ref 80.0–100.0)
Platelets: 153 10*3/uL (ref 150–400)
RBC: 3.58 MIL/uL — ABNORMAL LOW (ref 3.87–5.11)
RDW: 13.5 % (ref 11.5–15.5)
WBC: 5.5 10*3/uL (ref 4.0–10.5)
nRBC: 0 % (ref 0.0–0.2)

## 2021-12-28 LAB — PROTIME-INR
INR: 1.1 (ref 0.8–1.2)
Prothrombin Time: 14.2 seconds (ref 11.4–15.2)

## 2021-12-28 MED ORDER — LIDOCAINE HCL 1 % IJ SOLN
10.0000 mL | Freq: Once | INTRAMUSCULAR | Status: AC
  Administered 2021-12-28: 10 mL via INTRADERMAL

## 2021-12-28 MED ORDER — MIDAZOLAM HCL 2 MG/2ML IJ SOLN
INTRAMUSCULAR | Status: AC
  Filled 2021-12-28: qty 2

## 2021-12-28 MED ORDER — SODIUM CHLORIDE 0.9 % IV SOLN: INTRAVENOUS | Status: DC

## 2021-12-28 MED ORDER — MIDAZOLAM HCL 2 MG/2ML IJ SOLN
INTRAMUSCULAR | Status: AC | PRN
  Administered 2021-12-28: 1 mg via INTRAVENOUS

## 2021-12-28 MED ORDER — FENTANYL CITRATE (PF) 100 MCG/2ML IJ SOLN
INTRAMUSCULAR | Status: AC | PRN
  Administered 2021-12-28: 25 ug via INTRAVENOUS

## 2021-12-28 MED ORDER — FENTANYL CITRATE (PF) 100 MCG/2ML IJ SOLN
INTRAMUSCULAR | Status: AC
  Filled 2021-12-28: qty 2

## 2021-12-28 NOTE — Procedures (Signed)
Pre procedural Dx: Mesenteric mass  Post procedural Dx: Same  Technically successful CT guided biopsy of hypermetabolic mesenteric mass.   EBL: None.  Complications: None immediate.   Ronny Bacon, MD Pager #: [phone]

## 2021-12-28 NOTE — H&P (Signed)
Chief Complaint: Patient was seen in consultation today for mesenteric mass biopsy  Referring Physician(s): Lincoln Brigham  Supervising Physician: Sandi Mariscal  Patient Status: [facility] - Out-pt  History of Present Illness: Sabrina Deleon is a 71 y.o. female being worked up for mesenteric mass. This is hypermetabolic on PET as well as some retroperitoneal adenopathy. She has been referred for image guided biopsy. PMHx, meds, labs, imaging, allergies reviewed. Has held her ASA 5 full days as instructed Feels well, no recent fevers, chills, illness. Has been NPO today as directed.   Past Medical History:  Diagnosis Date   Anxiety    Aortic stenosis    Arthritis    Chronic kidney disease    Dr. Tami Ribas- noted increase kidney function levels- took pt off Metformin and started on Onglyza for Blood sugar control-pt being followed for this.   GERD (gastroesophageal reflux disease)    Gout    Hypertension    Hypothyroidism    Morbid obesity ([place]) 03/29/2018   Patient has elevated BMI with diabetes hyperlipidemia and hypertension   Pancreatitis    Type 2 diabetes mellitus ([place])    Wears glasses    Wears partial dentures    Upper    Past Surgical History:  Procedure Laterality Date   BACK SURGERY     Lumbar   CARPAL TUNNEL RELEASE  11/02/2011   Procedure: CARPAL TUNNEL RELEASE;  Surgeon: Tennis Must, MD;  Location: [place];  Service: Orthopedics;  Laterality: Right;   CARPAL TUNNEL RELEASE Left 01/14/2015   Procedure: LEFT CARPAL TUNNEL RELEASE;  Surgeon: Leanora Cover, MD;  Location: [place];  Service: Orthopedics;  Laterality: Left;   CHOLECYSTECTOMY     laparoscopic   COLONOSCOPY     COLONOSCOPY N/A 11/12/2015   Procedure: COLONOSCOPY;  Surgeon: Rogene Houston, MD;  [facility];  Service: Endoscopy;  Laterality: N/A;  9:30   ESOPHAGOGASTRODUODENOSCOPY N/A 11/12/2015   Procedure: ESOPHAGOGASTRODUODENOSCOPY (EGD);  Surgeon:  Rogene Houston, MD;  [facility];  Service: Endoscopy;  Laterality: N/A;   REVERSE SHOULDER ARTHROPLASTY Right 11/04/2018   REVERSE SHOULDER ARTHROPLASTY Right 11/04/2018   Procedure: REVERSE SHOULDER ARTHROPLASTY;  Surgeon: Verner Mould, MD;  Location: [place];  Service: Orthopedics;  Laterality: Right;   RIGHT/LEFT HEART CATH AND CORONARY ANGIOGRAPHY N/A 10/18/2017   Procedure: RIGHT/LEFT HEART CATH AND CORONARY ANGIOGRAPHY;  Surgeon: Burnell Blanks, MD;  Location: [place] [facility];  Service: Cardiovascular;  Laterality: N/A;   TOTAL HIP ARTHROPLASTY Left 03/22/2016   Procedure: LEFT TOTAL HIP ARTHROPLASTY ANTERIOR APPROACH;  Surgeon: Gaynelle Arabian, MD;  Location: [facility];  Service: Orthopedics;  Laterality: Left;    Allergies: Patient has no known allergies.  Medications: Prior to Admission medications   Medication Sig Start Date End Date Taking? Authorizing Provider  allopurinol (ZYLOPRIM) 100 MG tablet TAKE 1 TABLET(100 MG) BY MOUTH TWICE DAILY 11/11/21  Yes Luking, Elayne Snare, MD  calcium carbonate (TUMS - DOSED IN MG ELEMENTAL CALCIUM) 500 MG chewable tablet Chew 1-2 tablets by mouth daily as needed for indigestion or heartburn.   Yes [provider]  Cholecalciferol (VITAMIN D) 50 MCG (2000 UT) tablet Take 2,000 Units by mouth daily.   Yes [provider]  citalopram (CELEXA) 20 MG tablet TAKE 1 AND 1/2 TABLETS BY MOUTH EVERY DAY 05/30/21  Yes Luking, Scott A, MD  ferrous sulfate 325 (65 FE) MG tablet Take 325 mg by mouth daily  with breakfast.   Yes [provider]  furosemide (LASIX) 40 MG tablet TAKE 1 TABLET(40 MG) BY MOUTH DAILY 06/27/21  Yes Kathyrn Drown, MD  HYDROcodone-acetaminophen (NORCO/VICODIN) 5-325 MG tablet Take one tab po BID prn pain 12/03/21  Yes Nilda Simmer, NP  ketoconazole (NIZORAL) 2 % cream Apply 1 Application topically daily as needed (fungus).   Yes [provider]  levothyroxine (SYNTHROID) 125  MCG tablet Take 1 tablet (125 mcg total) by mouth daily before breakfast. 11/24/21  Yes Luking, Scott A, MD  lisinopril (ZESTRIL) 20 MG tablet Take 10 mg by mouth daily.   Yes [provider]  MISC NATURAL PRODUCTS PO Take 1 tablet by mouth daily. Super Beets otc supplement   Yes [provider]  pantoprazole (PROTONIX) 40 MG tablet TAKE 1 TABLET(40 MG) BY MOUTH DAILY Patient taking differently: Take 40 mg by mouth daily. 03/08/21  Yes Kathyrn Drown, MD  potassium chloride (KLOR-CON M) 10 MEQ tablet TAKE 1 TABLET BY MOUTH EVERY DAY ON MONDAY, [facility] AND FRIDAY 12/26/21  Yes Luking, Scott A, MD  rosuvastatin (CRESTOR) 10 MG tablet TAKE 1 TABLET(10 MG) BY MOUTH DAILY FOR CHOLESTEROL. STOP WHILE TAKING COLCHICINE FOR GOUT 08/17/21  Yes Luking, Elayne Snare, MD  zolpidem (AMBIEN) 5 MG tablet Take 1 tablet (5 mg total) by mouth at bedtime. 09/30/21  Yes Nilda Simmer, NP  aspirin EC 81 MG tablet Take 81 mg by mouth at bedtime.    [provider]  [facility] ULTRA test strip TEST ONCE DAILY 04/19/21   Kathyrn Drown, MD     Family History  Problem Relation Age of Onset   Breast cancer Mother    Lung cancer Mother        likely recurrent breast cancer   Breast cancer Paternal Grandmother    Head & neck cancer Nephew     Social History   Socioeconomic History   Marital status: Married    Spouse name: Not on file   Number of children: Not on file   Years of education: Not on file   Highest education level: Not on file  Occupational History   Not on file  Tobacco Use   Smoking status: Former    Types: Cigarettes    Quit date: 10/29/1981    Years since quitting: 40.1   Smokeless tobacco: Never  Vaping Use   Vaping Use: Never used  Substance and Sexual Activity   Alcohol use: Yes    Comment: occasionally   Drug use: No   Sexual activity: Not Currently  Other Topics Concern   Not on file  Social History Narrative   Not on file   Social Determinants of  Health   Financial Resource Strain: Not on file  Food Insecurity: Not on file  Transportation Needs: Not on file  Physical Activity: Not on file  Stress: Not on file  Social Connections: Not on file    Review of Systems: A 12 point ROS discussed and pertinent positives are indicated in the HPI above.  All other systems are negative.  Review of Systems  Vital Signs: BP (!) 159/88   Pulse 64   Temp 97.7 F (36.5 C) (Temporal)   Resp 16   Ht 5' 8.5" (1.74 m)   Wt 168 lb (76.2 kg)   SpO2 98%   BMI 25.17 kg/m   Physical Exam Constitutional:      Appearance: Normal appearance.  HENT:     Mouth/Throat:  Mouth: Mucous membranes are moist.     Pharynx: Oropharynx is clear.  Cardiovascular:     Rate and Rhythm: Normal rate and regular rhythm.     Heart sounds: Normal heart sounds.  Pulmonary:     Effort: Pulmonary effort is normal. No respiratory distress.     Breath sounds: Normal breath sounds.  Abdominal:     General: There is no distension.     Palpations: Abdomen is soft.     Tenderness: There is no abdominal tenderness.  Neurological:     General: No focal deficit present.     Mental Status: She is alert and oriented to person, place, and time.  Psychiatric:        Mood and Affect: Mood normal.        Thought Content: Thought content normal.        Judgment: Judgment normal.     Imaging: NM PET Image Initial (PI) Skull Base To Thigh  Result Date: 12/16/2021 CLINICAL DATA:  Initial treatment strategy for mesenteric mass. EXAM: NUCLEAR MEDICINE PET SKULL BASE TO THIGH TECHNIQUE: 8.4 mCi F-18 FDG was injected intravenously. Full-ring PET imaging was performed from the skull base to thigh after the radiotracer. CT data was obtained and used for attenuation correction and anatomic localization. Fasting blood glucose: 99 mg/dl COMPARISON:  CT angiography of the chest, abdomen and pelvis 11/29/2021. FINDINGS: Mediastinal blood pool activity: SUV max 3.0 Liver activity:  SUV max 3.5 NECK: No abnormal hypermetabolism. Incidental CT findings: None. CHEST: No abnormal hypermetabolism. Incidental CT findings: Atherosclerotic calcification of the aorta, aortic valve and coronary arteries. Heart is enlarged. No pericardial or pleural effusion. ABDOMEN/PELVIS: No abnormal hypermetabolism in the liver, adrenal glands, spleen or pancreas. Hypermetabolic 1.7 cm retrocrural lymph node (4/96), SUV max 7.2. Left periaortic adenopathy measures up to 1.3 cm (4/119), SUV max 6.2. Mildly hypermetabolic mesenteric lymph nodes. Index 1.4 cm lymph node adjacent to the superior mesenteric artery (4/123), SUV max 3.2. Hypermetabolic partially calcified mesenteric mass measures 3.1 x 5.7 cm, SUV max 6.8. Incidental CT findings: Liver is unremarkable. Cholecystectomy. Adrenal glands are unremarkable. Parenchymal calcification in the upper pole right kidney. 1.5 cm hyperdense lesion in the upper pole left kidney. No specific follow-up necessary. Spleen, pancreas, stomach and bowel are unremarkable. SKELETON: No abnormal hypermetabolism. Incidental CT findings: Left hip arthroplasty. Degenerative changes in the spine. Old left rib fractures. Right shoulder arthroplasty. IMPRESSION: 1. Hypermetabolic mesenteric mass with hypermetabolic adenopathy in the small bowel mesentery, abdominal retroperitoneum and retrocrural stations. If due to lymphoma, exam is categorized as [place] 4. Carcinoid not excluded. 2. Aortic atherosclerosis (ICD10-I70.0). Coronary artery calcification. Electronically Signed   By: Lorin Picket M.D.   On: 12/16/2021 14:00   CT CORONARY MORPH W/CTA COR W/SCORE W/CA W/CM &/OR WO/CM  Addendum Date: 12/05/2021   ADDENDUM REPORT: 12/05/2021 09:03 CLINICAL DATA:  Aortic valve replacement (TAVR), pre-op eval EXAM: Cardiac TAVR CT TECHNIQUE: The patient was scanned on a Siemens Force 102 slice scanner. A 120 kV retrospective scan was triggered in the descending thoracic aorta at 111 HU's.  Gantry rotation speed was 270 msecs and collimation was .9 mm. The 3D data set was reconstructed in 5% intervals of the R-R cycle. Systolic and diastolic phases were analyzed on a dedicated work station using MPR, MIP and VRT modes. The patient received 130m OMNIPAQUE IOHEXOL 350 MG/ML SOLN of contrast. FINDINGS: Aortic Valve: Tricuspid aortic valve. Severely reduced cusp separation. Severely thickened, severely calcified aortic valve cusps. AV calcium score: 3761  Virtual Basal Annulus Measurements: Maximum/Minimum Diameter: 30.4 x 22.9 mm Perimeter: 84.5 mm Area:  540 mm2 LVOT calcifications below commissure of non- and left coronary cusps. Membranous septal length: 5.5 mm Based on these measurements, the annulus would be suitable for a 29 mm Sapien 3 valve. Measurements are borderline for 26 mm Sapien 3 valve, recommend heart team discussion for valve sizing. Based on measurements, can also consider 34 mm Medtronic Evolut valve. Sinus of Valsalva Measurements: Non-coronary:  32 mm Right - coronary:  31 mm Left - coronary:  31 mm Sinus of Valsalva Height: Left: 22.6 mm Right: 18.3 mm Aorta: Normal variant 4 vessel branch pattern of aortic arch, left vertebral artery originates off aortic arch. Sinotubular Junction: 28.5 mm Ascending Thoracic Aorta:  35 mm Aortic Arch:  26 mm Descending Thoracic Aorta not fully visualized, measurement made at proximal descending aorta: 28 mm Coronary Artery Height above Annulus: Left main: 16.8 mm Right coronary: 20 mm Coronary Arteries: Normal coronary origin. Right dominance. The study was performed without use of NTG and insufficient for plaque evaluation. Coronary artery calcium seen in left coronary system distribution. Optimum Fluoroscopic Angle for Delivery: LAO 8 CAU 7 OTHER: Atria: Biatrial chamber dilation Left atrial appendage: No thrombus. Mitral valve: moderate mitral annular calcifications. Pulmonary artery: Mild dilation, 31 mm. Pulmonary veins: Normal anatomy.  IMPRESSION: 1. Tricuspid aortic valve with severely reduced cusp excursion. Severely thickened and severely calcified aortic valve cusps. 2.  Aortic valve calcium score: 3761 3. Annulus area: 540 mm2, suitable for 29 mm Sapien 3 valve. LVOT calcifications below commissure of non- and left coronary cusps. Recommend Heart Team discussion for valve sizing. 4.  Sufficient coronary artery heights from annulus. 5.  Optimum fluoroscopic angle for delivery: LAO 8 CAU 7 Electronically Signed   By: Cherlynn Kaiser M.D.   On: 12/05/2021 09:03   Result Date: 12/05/2021 EXAM: OVER-READ INTERPRETATION  CT CHEST The following report is a limited chest CT over-read performed by radiologist Dr. Salvatore Marvel of [facility], [place] on 11/29/2021. This over-read does not include interpretation of cardiac or coronary anatomy or pathology. The cardiac CTA interpretation by the cardiologist is attached. COMPARISON:  02/01/2018 chest CT angiogram. FINDINGS: Please see the separate concurrent chest CT angiogram report for details. IMPRESSION: Please see the separate concurrent chest CT angiogram report for details. Electronically Signed: By: Ilona Sorrel M.D. On: 11/29/2021 12:02   CT ANGIO ABDOMEN PELVIS  W &/OR WO CONTRAST  Result Date: 11/29/2021 CLINICAL DATA:  Aortic valve replacement (TAVR), pre-op eval. TAVR protocol. Severe aortic stenosis. EXAM: CT ANGIOGRAPHY CHEST, ABDOMEN AND PELVIS TECHNIQUE: Multidetector CT imaging through the chest, abdomen and pelvis was performed using the standard protocol during bolus administration of intravenous contrast. Multiplanar reconstructed images and MIPs were obtained and reviewed to evaluate the vascular anatomy. RADIATION DOSE REDUCTION: This exam was performed according to the departmental dose-optimization program which includes automated exposure control, adjustment of the mA and/or kV according to patient size and/or use of iterative reconstruction technique. CONTRAST:  129m  OMNIPAQUE IOHEXOL 350 MG/ML SOLN COMPARISON:  02/01/2018 CT angiogram of the chest, abdomen and pelvis. FINDINGS: CTA CHEST FINDINGS Cardiovascular: Top normal heart size. Diffuse thickening and coarse calcification of the aortic valve. No significant pericardial effusion/thickening. Left anterior descending and left circumflex coronary atherosclerosis. Atherosclerotic nonaneurysmal thoracic aorta. Dilated main pulmonary artery (3.8 cm diameter). No central pulmonary emboli. Mediastinum/Nodes: Peripherally calcified 1.4 cm right thyroid isthmus nodule, stable. Unremarkable esophagus. No axillary adenopathy. New right paratracheal adenopathy  up to 1.6 cm (series 8/image 76). Newly enlarged 1.2 cm subcarinal node (series 8/image 82). Newly mildly enlarged 1.0 cm left hilar node (series 8/image 79). Newly mildly enlarged 1.1 cm right infrahilar node (series 8/image 88). Newly enlarged 2.1 cm right retrocrural node (series 8/image 116). Lungs/Pleura: No pneumothorax. No pleural effusion. No acute consolidative airspace disease or lung masses. Three scattered tiny solid right pulmonary nodules, largest 0.3 cm in the right middle lobe (series 9/image 57), not definitely seen on prior chest CT, which was partially obscured by atelectasis. Musculoskeletal: No aggressive appearing focal osseous lesions. Right total shoulder arthroplasty. Scattered healed deformities in the lateral left mid ribs. Mild thoracic spondylosis. CTA ABDOMEN AND PELVIS FINDINGS Hepatobiliary: Normal liver with no liver mass. Cholecystectomy. No biliary ductal dilatation. Pancreas: Normal, with no mass or duct dilation. Spleen: Normal size. No mass. Adrenals/Urinary Tract: Normal adrenals. Hyperdense 1.5 cm lateral upper left renal cortical lesion (series 8/image 130), increased from 1.0 cm on 02/01/2018 CT. Clustered coarse calcifications in the upper right kidney unchanged. No additional contour deforming renal lesions. No hydronephrosis.  Nondistended bladder is obscured by streak artifact from left hip hardware with no gross bladder abnormality. Stomach/Bowel: Normal non-distended stomach. Normal caliber small bowel with no small bowel wall thickening. Normal appendix. Normal large bowel with no diverticulosis, large bowel wall thickening or pericolonic fat stranding. Vascular/Lymphatic: Atherosclerotic nonaneurysmal abdominal aorta. Enlarged left para-aortic lymph nodes, largest 1.4 cm (series 8/image 144), new. Newly enlarged 1.0 cm aortocaval node (series 8/image 144). Lobulated solid 5.5 x 3.1 cm mass in the central mesentery with a few coarse internal calcifications (series 8/image 170), increased from 4.7 x 1.8 cm on 02/01/2018 CT. Reproductive: Grossly normal uterus.  No adnexal mass. Other: No pneumoperitoneum, ascites or focal fluid collection. Musculoskeletal: No aggressive appearing focal osseous lesions. Left total hip arthroplasty. Moderate lumbar spondylosis. VASCULAR MEASUREMENTS PERTINENT TO TAVR: AORTA: Minimal Aortic Diameter-14.0 x 13.6 mm Severity of Aortic Calcification-mild RIGHT PELVIS: Right Common Iliac Artery - Minimal Diameter-8.5 x 7.0 mm Tortuosity-mild Calcification-mild Right External Iliac Artery - Minimal Diameter-7.4 x 7.2 mm Tortuosity-mild-to-moderate Calcification-mild Right Common Femoral Artery - Minimal Diameter-8.6 x 7.9 mm Tortuosity-mild Calcification-mild LEFT PELVIS: Left Common Iliac Artery - Minimal Diameter-8.9 x 8.7 mm Tortuosity-mild Calcification-mild Left External Iliac Artery - Minimal Diameter-6.4 x 6.3 mm Tortuosity-mild-to-moderate Calcification-mild Left Common Femoral Artery - Minimal Diameter-7.5 x 6.7 mm Tortuosity-mild Calcification-mild Review of the MIP images confirms the above findings. IMPRESSION: 1. Bulky central mesenteric soft tissue mass, increased since 2019 CT. New widespread mild to moderate retroperitoneal, retrocrural, mediastinal and bilateral hilar lymphadenopathy. Findings  are most suggestive of lymphoma, with metastatic disease from an unknown primary not excluded. Oncology consultation and PET-CT suggested at this time. 2. Vascular findings and measurements pertinent to potential TAVR procedure, as detailed. 3. Diffusely thickened and coarsely calcified aortic valve, compatible with the reported history of severe aortic stenosis. 4. Dilated main pulmonary artery, suggesting chronic pulmonary arterial hypertension. 5. Scattered tiny solid right pulmonary nodules, largest 0.3 cm, not definitely seen on prior chest CT. Suggest attention on follow-up noncontrast chest CT in 3-6 months. 6. Indeterminate 1.5 cm hyperdense lateral upper left renal cortical lesion, mildly increased in size since 02/01/2018 CT. MRI (preferred) or CT abdomen without and with IV contrast is indicated for further characterization to exclude renal neoplasm. 7. Aortic Atherosclerosis (ICD10-I70.0). These results will be called to the ordering clinician or representative by the Radiologist Assistant, and communication documented in the PACS or [employer]. Electronically Signed  By: Ilona Sorrel M.D.   On: 11/29/2021 14:38   CT ANGIO CHEST AORTA W/CM & OR WO/CM  Result Date: 11/29/2021 CLINICAL DATA:  Aortic valve replacement (TAVR), pre-op eval. TAVR protocol. Severe aortic stenosis. EXAM: CT ANGIOGRAPHY CHEST, ABDOMEN AND PELVIS TECHNIQUE: Multidetector CT imaging through the chest, abdomen and pelvis was performed using the standard protocol during bolus administration of intravenous contrast. Multiplanar reconstructed images and MIPs were obtained and reviewed to evaluate the vascular anatomy. RADIATION DOSE REDUCTION: This exam was performed according to the departmental dose-optimization program which includes automated exposure control, adjustment of the mA and/or kV according to patient size and/or use of iterative reconstruction technique. CONTRAST:  161m OMNIPAQUE IOHEXOL 350 MG/ML SOLN  COMPARISON:  02/01/2018 CT angiogram of the chest, abdomen and pelvis. FINDINGS: CTA CHEST FINDINGS Cardiovascular: Top normal heart size. Diffuse thickening and coarse calcification of the aortic valve. No significant pericardial effusion/thickening. Left anterior descending and left circumflex coronary atherosclerosis. Atherosclerotic nonaneurysmal thoracic aorta. Dilated main pulmonary artery (3.8 cm diameter). No central pulmonary emboli. Mediastinum/Nodes: Peripherally calcified 1.4 cm right thyroid isthmus nodule, stable. Unremarkable esophagus. No axillary adenopathy. New right paratracheal adenopathy up to 1.6 cm (series 8/image 76). Newly enlarged 1.2 cm subcarinal node (series 8/image 82). Newly mildly enlarged 1.0 cm left hilar node (series 8/image 79). Newly mildly enlarged 1.1 cm right infrahilar node (series 8/image 88). Newly enlarged 2.1 cm right retrocrural node (series 8/image 116). Lungs/Pleura: No pneumothorax. No pleural effusion. No acute consolidative airspace disease or lung masses. Three scattered tiny solid right pulmonary nodules, largest 0.3 cm in the right middle lobe (series 9/image 57), not definitely seen on prior chest CT, which was partially obscured by atelectasis. Musculoskeletal: No aggressive appearing focal osseous lesions. Right total shoulder arthroplasty. Scattered healed deformities in the lateral left mid ribs. Mild thoracic spondylosis. CTA ABDOMEN AND PELVIS FINDINGS Hepatobiliary: Normal liver with no liver mass. Cholecystectomy. No biliary ductal dilatation. Pancreas: Normal, with no mass or duct dilation. Spleen: Normal size. No mass. Adrenals/Urinary Tract: Normal adrenals. Hyperdense 1.5 cm lateral upper left renal cortical lesion (series 8/image 130), increased from 1.0 cm on 02/01/2018 CT. Clustered coarse calcifications in the upper right kidney unchanged. No additional contour deforming renal lesions. No hydronephrosis. Nondistended bladder is obscured by streak  artifact from left hip hardware with no gross bladder abnormality. Stomach/Bowel: Normal non-distended stomach. Normal caliber small bowel with no small bowel wall thickening. Normal appendix. Normal large bowel with no diverticulosis, large bowel wall thickening or pericolonic fat stranding. Vascular/Lymphatic: Atherosclerotic nonaneurysmal abdominal aorta. Enlarged left para-aortic lymph nodes, largest 1.4 cm (series 8/image 144), new. Newly enlarged 1.0 cm aortocaval node (series 8/image 144). Lobulated solid 5.5 x 3.1 cm mass in the central mesentery with a few coarse internal calcifications (series 8/image 170), increased from 4.7 x 1.8 cm on 02/01/2018 CT. Reproductive: Grossly normal uterus.  No adnexal mass. Other: No pneumoperitoneum, ascites or focal fluid collection. Musculoskeletal: No aggressive appearing focal osseous lesions. Left total hip arthroplasty. Moderate lumbar spondylosis. VASCULAR MEASUREMENTS PERTINENT TO TAVR: AORTA: Minimal Aortic Diameter-14.0 x 13.6 mm Severity of Aortic Calcification-mild RIGHT PELVIS: Right Common Iliac Artery - Minimal Diameter-8.5 x 7.0 mm Tortuosity-mild Calcification-mild Right External Iliac Artery - Minimal Diameter-7.4 x 7.2 mm Tortuosity-mild-to-moderate Calcification-mild Right Common Femoral Artery - Minimal Diameter-8.6 x 7.9 mm Tortuosity-mild Calcification-mild LEFT PELVIS: Left Common Iliac Artery - Minimal Diameter-8.9 x 8.7 mm Tortuosity-mild Calcification-mild Left External Iliac Artery - Minimal Diameter-6.4 x 6.3 mm Tortuosity-mild-to-moderate Calcification-mild Left Common Femoral  Artery - Minimal Diameter-7.5 x 6.7 mm Tortuosity-mild Calcification-mild Review of the MIP images confirms the above findings. IMPRESSION: 1. Bulky central mesenteric soft tissue mass, increased since 2019 CT. New widespread mild to moderate retroperitoneal, retrocrural, mediastinal and bilateral hilar lymphadenopathy. Findings are most suggestive of lymphoma, with  metastatic disease from an unknown primary not excluded. Oncology consultation and PET-CT suggested at this time. 2. Vascular findings and measurements pertinent to potential TAVR procedure, as detailed. 3. Diffusely thickened and coarsely calcified aortic valve, compatible with the reported history of severe aortic stenosis. 4. Dilated main pulmonary artery, suggesting chronic pulmonary arterial hypertension. 5. Scattered tiny solid right pulmonary nodules, largest 0.3 cm, not definitely seen on prior chest CT. Suggest attention on follow-up noncontrast chest CT in 3-6 months. 6. Indeterminate 1.5 cm hyperdense lateral upper left renal cortical lesion, mildly increased in size since 02/01/2018 CT. MRI (preferred) or CT abdomen without and with IV contrast is indicated for further characterization to exclude renal neoplasm. 7. Aortic Atherosclerosis (ICD10-I70.0). These results will be called to the ordering clinician or representative by the Radiologist Assistant, and communication documented in the PACS or [employer]. Electronically Signed   By: Ilona Sorrel M.D.   On: 11/29/2021 14:38    Labs:  CBC: Recent Labs    05/30/21 1015 12/13/21 1314 12/28/21 0836  WBC 5.9 5.8 5.5  HGB 10.1* 10.3* 11.0*  HCT 31.1* 31.3* 33.1*  PLT 211 148* 153    COAGS: Recent Labs    12/28/21 0836  INR 1.1    BMP: Recent Labs    02/25/21 1110 05/30/21 1015 11/15/21 1109 12/13/21 1314  NA  --  142 136 141  K 4.7 3.4* 3.9 3.8  CL  --  102 99 108  CO2  --  '25 24 30  '$ GLUCOSE  --  116* 110* 105*  BUN  --  15 18 26*  CALCIUM  --  9.8 10.5* 10.0  CREATININE  --  1.08* 1.13* 1.17*  GFRNONAA  --   --   --  50*    LIVER FUNCTION TESTS: Recent Labs    05/30/21 1015 12/13/21 1314  BILITOT 0.4 0.7  AST 31 36  ALT 11 19  ALKPHOS 80 53  PROT 7.0 7.2  ALBUMIN 4.5 4.2     Assessment and Plan: Mesenteric mass and adenopathy For image guided biopsy today. Labs reviewed. Risks and benefits of  mesenteric mass bx was discussed with the patient and/or patient's family including, but not limited to bleeding, infection, damage to adjacent structures or low yield requiring additional tests.  All of the questions were answered and there is agreement to proceed.  Consent signed and in chart.    Electronically Signed: Ascencion Dike, PA-C 12/28/2021, 9:46 AM   I spent a total of 20 in face to face in clinical consultation, greater than 50% of which was counseling/coordinating care for mesenteric mass bx

## 2021-12-30 ENCOUNTER — Ambulatory Visit (INDEPENDENT_AMBULATORY_CARE_PROVIDER_SITE_OTHER): Payer: Medicare Other | Admitting: Nurse Practitioner

## 2021-12-30 VITALS — BP 112/58 | Ht 65.75 in | Wt 166.0 lb

## 2021-12-30 DIAGNOSIS — Z0000 Encounter for general adult medical examination without abnormal findings: Secondary | ICD-10-CM

## 2021-12-30 DIAGNOSIS — Z1231 Encounter for screening mammogram for malignant neoplasm of breast: Secondary | ICD-10-CM | POA: Diagnosis not present

## 2021-12-30 DIAGNOSIS — I10 Essential (primary) hypertension: Secondary | ICD-10-CM | POA: Diagnosis not present

## 2021-12-30 DIAGNOSIS — E1122 Type 2 diabetes mellitus with diabetic chronic kidney disease: Secondary | ICD-10-CM | POA: Diagnosis not present

## 2021-12-30 DIAGNOSIS — Z23 Encounter for immunization: Secondary | ICD-10-CM

## 2021-12-30 DIAGNOSIS — N1832 Chronic kidney disease, stage 3b: Secondary | ICD-10-CM | POA: Diagnosis not present

## 2021-12-30 NOTE — Progress Notes (Unsigned)
   Subjective:    Patient ID: Sabrina Deleon, female    DOB: [birth year], 71 y.o.   MRN: [id number]  HPI AWV- Annual Wellness Visit  The patient was seen for their annual wellness visit. The patient's past medical history, surgical history, and family history were reviewed. Pertinent vaccines were reviewed ( tetanus, pneumonia, shingles, flu) The patient's medication list was reviewed and updated.  The height and weight were entered.  BMI recorded in electronic record elsewhere  Cognitive screening was completed. Outcome of Mini - Cog: ***   Falls /depression screening electronically recorded within record elsewhere  Current tobacco usage:*** (All patients who use tobacco were given written and verbal information on quitting)  Recent listing of emergency department/hospitalizations over the past year were reviewed.  current specialist the patient sees on a regular basis: ***   Medicare annual wellness visit patient questionnaire was reviewed.  A written screening schedule for the patient for the next 5-10 years was given. Appropriate discussion of followup regarding next visit was discussed.      Review of Systems     Objective:   Physical Exam        Assessment & Plan:

## 2021-12-31 ENCOUNTER — Encounter: Payer: Self-pay | Admitting: Nurse Practitioner

## 2021-12-31 LAB — MICROALBUMIN / CREATININE URINE RATIO
Creatinine, Urine: 84.5 mg/dL
Microalb/Creat Ratio: 14 mg/g creat (ref 0–29)
Microalbumin, Urine: 11.9 ug/mL

## 2021-12-31 LAB — HEMOGLOBIN A1C
Est. average glucose Bld gHb Est-mCnc: 120 mg/dL
Hgb A1c MFr Bld: 5.8 % — ABNORMAL HIGH (ref 4.8–5.6)

## 2022-01-02 LAB — SURGICAL PATHOLOGY

## 2022-01-03 ENCOUNTER — Telehealth: Payer: Self-pay | Admitting: Physician Assistant

## 2022-01-03 ENCOUNTER — Other Ambulatory Visit: Payer: Self-pay | Admitting: Hematology and Oncology

## 2022-01-03 DIAGNOSIS — C7A8 Other malignant neuroendocrine tumors: Secondary | ICD-10-CM

## 2022-01-03 NOTE — Telephone Encounter (Signed)
I called Ms. Matha Venneman to review the biopsy result from 12/28/2021.  Mesenteric mass biopsy was consistent with well-differentiated neuroendocrine tumor, grade 2.  Briefly discussed diagnosis and proliferation index of 7%.  Recommend treatment with lanreotide injections every 28 days.  We will obtain baseline PET dotatate imaging as well.  Patient will return to the clinic next week to see Dr. Lorenso Courier for labs and to start monthly lanreotide injections.  Patient expressed understanding of the plan provided and will discuss results with her husband.  I will follow-up with them Orland Mustard to answer any additional questions they might have.

## 2022-01-05 ENCOUNTER — Encounter: Payer: Medicare Other | Admitting: Surgery

## 2022-01-10 ENCOUNTER — Inpatient Hospital Stay: Payer: Medicare Other | Attending: Physician Assistant

## 2022-01-10 ENCOUNTER — Inpatient Hospital Stay: Payer: Medicare Other

## 2022-01-10 ENCOUNTER — Inpatient Hospital Stay (HOSPITAL_BASED_OUTPATIENT_CLINIC_OR_DEPARTMENT_OTHER): Payer: Medicare Other | Admitting: Hematology and Oncology

## 2022-01-10 ENCOUNTER — Other Ambulatory Visit: Payer: Self-pay | Admitting: Hematology and Oncology

## 2022-01-10 ENCOUNTER — Other Ambulatory Visit: Payer: Self-pay | Admitting: Family Medicine

## 2022-01-10 VITALS — BP 125/48 | HR 58 | Temp 97.9°F | Resp 17 | Ht 65.75 in | Wt 167.8 lb

## 2022-01-10 DIAGNOSIS — Z79899 Other long term (current) drug therapy: Secondary | ICD-10-CM | POA: Diagnosis not present

## 2022-01-10 DIAGNOSIS — C7B01 Secondary carcinoid tumors of distant lymph nodes: Secondary | ICD-10-CM | POA: Insufficient documentation

## 2022-01-10 DIAGNOSIS — Z7982 Long term (current) use of aspirin: Secondary | ICD-10-CM | POA: Insufficient documentation

## 2022-01-10 DIAGNOSIS — C7A00 Malignant carcinoid tumor of unspecified site: Secondary | ICD-10-CM | POA: Insufficient documentation

## 2022-01-10 DIAGNOSIS — K6389 Other specified diseases of intestine: Secondary | ICD-10-CM

## 2022-01-10 DIAGNOSIS — I129 Hypertensive chronic kidney disease with stage 1 through stage 4 chronic kidney disease, or unspecified chronic kidney disease: Secondary | ICD-10-CM | POA: Insufficient documentation

## 2022-01-10 DIAGNOSIS — N189 Chronic kidney disease, unspecified: Secondary | ICD-10-CM | POA: Diagnosis not present

## 2022-01-10 DIAGNOSIS — E1122 Type 2 diabetes mellitus with diabetic chronic kidney disease: Secondary | ICD-10-CM | POA: Insufficient documentation

## 2022-01-10 DIAGNOSIS — C7A8 Other malignant neuroendocrine tumors: Secondary | ICD-10-CM | POA: Diagnosis not present

## 2022-01-10 DIAGNOSIS — E039 Hypothyroidism, unspecified: Secondary | ICD-10-CM | POA: Insufficient documentation

## 2022-01-10 LAB — CMP (CANCER CENTER ONLY)
ALT: 16 U/L (ref 0–44)
AST: 33 U/L (ref 15–41)
Albumin: 4.2 g/dL (ref 3.5–5.0)
Alkaline Phosphatase: 58 U/L (ref 38–126)
Anion gap: 5 (ref 5–15)
BUN: 23 mg/dL (ref 8–23)
CO2: 28 mmol/L (ref 22–32)
Calcium: 10.6 mg/dL — ABNORMAL HIGH (ref 8.9–10.3)
Chloride: 107 mmol/L (ref 98–111)
Creatinine: 1.27 mg/dL — ABNORMAL HIGH (ref 0.44–1.00)
GFR, Estimated: 45 mL/min — ABNORMAL LOW (ref >60)
Glucose, Bld: 101 mg/dL — ABNORMAL HIGH (ref 70–99)
Potassium: 4.3 mmol/L (ref 3.5–5.1)
Sodium: 140 mmol/L (ref 135–145)
Total Bilirubin: 0.6 mg/dL (ref 0.3–1.2)
Total Protein: 7.4 g/dL (ref 6.5–8.1)

## 2022-01-10 LAB — CBC WITH DIFFERENTIAL (CANCER CENTER ONLY)
Abs Immature Granulocytes: 0.02 10*3/uL (ref 0.00–0.07)
Basophils Absolute: 0 10*3/uL (ref 0.0–0.1)
Basophils Relative: 1 %
Eosinophils Absolute: 0.4 10*3/uL (ref 0.0–0.5)
Eosinophils Relative: 7 %
HCT: 31.4 % — ABNORMAL LOW (ref 36.0–46.0)
Hemoglobin: 10.2 g/dL — ABNORMAL LOW (ref 12.0–15.0)
Immature Granulocytes: 0 %
Lymphocytes Relative: 22 %
Lymphs Abs: 1.4 10*3/uL (ref 0.7–4.0)
MCH: 29.8 pg (ref 26.0–34.0)
MCHC: 32.5 g/dL (ref 30.0–36.0)
MCV: 91.8 fL (ref 80.0–100.0)
Monocytes Absolute: 0.5 10*3/uL (ref 0.1–1.0)
Monocytes Relative: 8 %
Neutro Abs: 4.2 10*3/uL (ref 1.7–7.7)
Neutrophils Relative %: 62 %
Platelet Count: 202 10*3/uL (ref 150–400)
RBC: 3.42 MIL/uL — ABNORMAL LOW (ref 3.87–5.11)
RDW: 13.4 % (ref 11.5–15.5)
WBC Count: 6.6 10*3/uL (ref 4.0–10.5)
nRBC: 0 % (ref 0.0–0.2)

## 2022-01-10 MED ORDER — ONDANSETRON HCL 8 MG PO TABS: 8.0000 mg | ORAL_TABLET | Freq: Three times a day (TID) | ORAL | 0 refills | Status: DC | PRN

## 2022-01-10 MED ORDER — LANREOTIDE ACETATE 120 MG/0.5ML ~~LOC~~ SOLN
120.0000 mg | Freq: Once | SUBCUTANEOUS | Status: AC
  Administered 2022-01-10: 120 mg via SUBCUTANEOUS
  Filled 2022-01-10: qty 120

## 2022-01-10 NOTE — Progress Notes (Signed)
[facility] [phone]   [phone]  PROGRESS NOTE  Patient Care Team: Kathyrn Drown, MD as PCP - General (Family Medicine) Satira Sark, MD as PCP - Cardiology (Cardiology)  Hematological/Oncological History # Well Differentiated Neuroendocrine Tumor, GI origin. Grade 2.  11/29/2021: Patient underwent CT angiogram C/A/P prior to consideration of TAVR for severe aortic stenosis, noted to have bulky central mesenteric soft tissue mass that has increased in 2019.  Additionally there was new widespread mild to moderate retroperitoneal, retrocrural, mediastinal and bilateral hilar lymphadenopathy 12/15/2021: NM PET CT Scan showed hypermetabolic mesenteric mass with hypermetabolic adenopathy in the small bowel mesentery, abdominal retroperitoneum and retrocrural stations. 12/28/2021: IR guided biopsy of the mesenteric mass was consistent with well differentiated neuroendocrine tumor 01/10/2022: start of lanreotide '120mg'$  IM q 28 days.   Interval History:  Sabrina Deleon 71 y.o. female with medical history significant for newly diagnosed neuroendocrine tumor who presents for a follow up visit. The patient's last visit was on 12/13/2021. In the interim since the last visit she underwent a biopsy which confirmed the diagnosis but has not yet undergone her PET dotatate.   On exam today Sabrina Deleon reports that she did have a fall last week due to mechanical reasons.  She stumbled and fell.  She bruised her right arm during the process.  Otherwise she has been in stable health.  She had no issues with nausea, vomiting, or increased diarrhea.  She always does have some baseline diarrhea.  She has had no fevers, chills, sweats or other infectious symptoms.  Overall she is at her baseline level of health.  She tolerated the biopsy well without any residual side effects.  Full 10 point ROS was otherwise negative.  The bulk of our discussion focused on the diagnosis of  well-differentiated neuroendocrine tumor of the GI tract, as well as the treatment lanreotide and the steps moving forward.  The patient voiced understanding of the plan, medication, and the diagnosis.  MEDICAL HISTORY:  Past Medical History:  Diagnosis Date   Anxiety    Aortic stenosis    Arthritis    Chronic kidney disease    Dr. Tami Ribas- noted increase kidney function levels- took pt off Metformin and started on Onglyza for Blood sugar control-pt being followed for this.   GERD (gastroesophageal reflux disease)    Gout    Hypertension    Hypothyroidism    Morbid obesity ([place]) 03/29/2018   Patient has elevated BMI with diabetes hyperlipidemia and hypertension   Pancreatitis    Type 2 diabetes mellitus ([place])    Wears glasses    Wears partial dentures    Upper    SURGICAL HISTORY: Past Surgical History:  Procedure Laterality Date   BACK SURGERY     Lumbar   CARPAL TUNNEL RELEASE  11/02/2011   Procedure: CARPAL TUNNEL RELEASE;  Surgeon: Tennis Must, MD;  Location: [facility];  Service: Orthopedics;  Laterality: Right;   CARPAL TUNNEL RELEASE Left 01/14/2015   Procedure: LEFT CARPAL TUNNEL RELEASE;  Surgeon: Leanora Cover, MD;  Location: [facility];  Service: Orthopedics;  Laterality: Left;   CHOLECYSTECTOMY     laparoscopic   COLONOSCOPY     COLONOSCOPY N/A 11/12/2015   Procedure: COLONOSCOPY;  Surgeon: Rogene Houston, MD;  [facility];  Service: Endoscopy;  Laterality: N/A;  9:30   ESOPHAGOGASTRODUODENOSCOPY N/A 11/12/2015   Procedure: ESOPHAGOGASTRODUODENOSCOPY (EGD);  Surgeon: Rogene Houston, MD;  [facility];  Service: Endoscopy;  Laterality: N/A;   REVERSE SHOULDER ARTHROPLASTY Right 11/04/2018   REVERSE SHOULDER ARTHROPLASTY Right 11/04/2018   Procedure: REVERSE SHOULDER ARTHROPLASTY;  Surgeon: Verner Mould, MD;  Location: [facility];  Service: Orthopedics;  Laterality: Right;   RIGHT/LEFT HEART CATH AND  CORONARY ANGIOGRAPHY N/A 10/18/2017   Procedure: RIGHT/LEFT HEART CATH AND CORONARY ANGIOGRAPHY;  Surgeon: Burnell Blanks, MD;  Location: [facility];  Service: Cardiovascular;  Laterality: N/A;   TOTAL HIP ARTHROPLASTY Left 03/22/2016   Procedure: LEFT TOTAL HIP ARTHROPLASTY ANTERIOR APPROACH;  Surgeon: Gaynelle Arabian, MD;  Location: [facility];  Service: Orthopedics;  Laterality: Left;    SOCIAL HISTORY: Social History   Socioeconomic History   Marital status: Married    Spouse name: Not on file   Number of children: Not on file   Years of education: Not on file   Highest education level: Not on file  Occupational History   Not on file  Tobacco Use   Smoking status: Former    Types: Cigarettes    Quit date: 10/29/1981    Years since quitting: 40.2   Smokeless tobacco: Never  Vaping Use   Vaping Use: Never used  Substance and Sexual Activity   Alcohol use: Yes    Comment: occasionally   Drug use: No   Sexual activity: Not Currently  Other Topics Concern   Not on file  Social History Narrative   Not on file   Social Determinants of Health   Financial Resource Strain: Not on file  Food Insecurity: Not on file  Transportation Needs: Not on file  Physical Activity: Not on file  Stress: Not on file  Social Connections: Not on file  Intimate Partner Violence: Not on file    FAMILY HISTORY: Family History  Problem Relation Age of Onset   Breast cancer Mother    Lung cancer Mother        likely recurrent breast cancer   Breast cancer Paternal Grandmother    Head & neck cancer Nephew     ALLERGIES:  has No Known Allergies.  MEDICATIONS:  Current Outpatient Medications  Medication Sig Dispense Refill   allopurinol (ZYLOPRIM) 100 MG tablet TAKE 1 TABLET(100 MG) BY MOUTH TWICE DAILY 180 tablet 1   aspirin EC 81 MG tablet Take 81 mg by mouth at bedtime.     calcium carbonate (TUMS - DOSED IN MG ELEMENTAL CALCIUM) 500 MG chewable tablet Chew 1-2 tablets by  mouth daily as needed for indigestion or heartburn.     Cholecalciferol (VITAMIN D) 50 MCG (2000 UT) tablet Take 2,000 Units by mouth daily.     citalopram (CELEXA) 20 MG tablet TAKE 1 AND 1/2 TABLETS BY MOUTH EVERY DAY 135 tablet 1   ferrous sulfate 325 (65 FE) MG tablet Take 325 mg by mouth daily with breakfast.     furosemide (LASIX) 40 MG tablet TAKE 1 TABLET(40 MG) BY MOUTH DAILY 90 tablet 1   HYDROcodone-acetaminophen (NORCO/VICODIN) 5-325 MG tablet Take one tab po BID prn pain 45 tablet 0   ketoconazole (NIZORAL) 2 % cream Apply 1 Application topically daily as needed (fungus).     levothyroxine (SYNTHROID) 125 MCG tablet Take 1 tablet (125 mcg total) by mouth daily before breakfast. 90 tablet 1   lisinopril (ZESTRIL) 20 MG tablet Take 10 mg by mouth daily.     MISC NATURAL PRODUCTS PO Take 1 tablet by mouth daily. Super Beets otc supplement  ondansetron (ZOFRAN) 8 MG tablet Take 1 tablet (8 mg total) by mouth every 8 (eight) hours as needed. 30 tablet 0   ONETOUCH ULTRA test strip TEST ONCE DAILY 50 strip 5   pantoprazole (PROTONIX) 40 MG tablet TAKE 1 TABLET(40 MG) BY MOUTH DAILY (Patient taking differently: Take 40 mg by mouth daily.) 90 tablet 1   potassium chloride (KLOR-CON M) 10 MEQ tablet TAKE 1 TABLET BY MOUTH EVERY DAY ON MONDAY, WEDNESDAY AND FRIDAY 14 tablet 6   rosuvastatin (CRESTOR) 10 MG tablet TAKE 1 TABLET(10 MG) BY MOUTH DAILY FOR CHOLESTEROL. STOP WHILE TAKING COLCHICINE FOR GOUT 90 tablet 1   zolpidem (AMBIEN) 5 MG tablet Take 1 tablet (5 mg total) by mouth at bedtime. 30 tablet 3   No current facility-administered medications for this visit.    REVIEW OF SYSTEMS:   Constitutional: ( - ) fevers, ( - )  chills , ( - ) night sweats Eyes: ( - ) blurriness of vision, ( - ) double vision, ( - ) watery eyes Ears, nose, mouth, throat, and face: ( - ) mucositis, ( - ) sore throat Respiratory: ( - ) cough, ( - ) dyspnea, ( - ) wheezes Cardiovascular: ( - ) palpitation, (  - ) chest discomfort, ( - ) lower extremity swelling Gastrointestinal:  ( - ) nausea, ( - ) heartburn, ( - ) change in bowel habits Skin: ( - ) abnormal skin rashes Lymphatics: ( - ) new lymphadenopathy, ( - ) easy bruising Neurological: ( - ) numbness, ( - ) tingling, ( - ) new weaknesses Behavioral/Psych: ( - ) mood change, ( - ) new changes  All other systems were reviewed with the patient and are negative.  PHYSICAL EXAMINATION: ECOG PERFORMANCE STATUS: 1 - Symptomatic but completely ambulatory  Vitals:   01/10/22 1022  BP: (!) 125/48  Pulse: (!) 58  Resp: 17  Temp: 97.9 F (36.6 C)  SpO2: 100%   Filed Weights   01/10/22 1022  Weight: 167 lb 12.8 oz (76.1 kg)    GENERAL: Well-appearing elderly Caucasian female, alert, no distress and comfortable SKIN: skin color, texture, turgor are normal, no rashes or significant lesions EYES: conjunctiva are pink and non-injected, sclera clear LUNGS: clear to auscultation and percussion with normal breathing effort HEART: regular rate & rhythm and no murmurs and no lower extremity edema Musculoskeletal: no cyanosis of digits and no clubbing  PSYCH: alert & oriented x 3, fluent speech NEURO: no focal motor/sensory deficits  LABORATORY DATA:  I have reviewed the data as listed    Latest Ref Rng & Units 01/10/2022    9:29 AM 12/28/2021    8:36 AM 12/13/2021    1:14 PM  CBC  WBC 4.0 - 10.5 K/uL 6.6  5.5  5.8   Hemoglobin 12.0 - 15.0 g/dL 10.2  11.0  10.3   Hematocrit 36.0 - 46.0 % 31.4  33.1  31.3   Platelets 150 - 400 K/uL 202  153  148        Latest Ref Rng & Units 01/10/2022    9:29 AM 12/13/2021    1:14 PM 11/15/2021   11:09 AM  CMP  Glucose 70 - 99 mg/dL 101  105  110   BUN 8 - 23 mg/dL '23  26  18   '$ Creatinine 0.44 - 1.00 mg/dL 1.27  1.17  1.13   Sodium 135 - 145 mmol/L 140  141  136   Potassium 3.5 - 5.1 mmol/L 4.3  3.8  3.9   Chloride 98 - 111 mmol/L 107  108  99   CO2 22 - 32 mmol/L '28  30  24   '$ Calcium 8.9 - 10.3  mg/dL 10.6  10.0  10.5   Total Protein 6.5 - 8.1 g/dL 7.4  7.2    Total Bilirubin 0.3 - 1.2 mg/dL 0.6  0.7    Alkaline Phos 38 - 126 U/L 58  53    AST 15 - 41 U/L 33  36    ALT 0 - 44 U/L 16  19     RADIOGRAPHIC STUDIES: CT Biopsy  Result Date: 01/14/22 INDICATION: No known primary, now with partially calcified mesenteric mass. Please perform CT-guided biopsy for tissue diagnostic purposes. EXAM: CT-GUIDED MESENTERIC MASS BIOPSY COMPARISON:  PET-CT-12/07/2021 CTA chest, abdomen and pelvis-11/29/2021 MEDICATIONS: None. ANESTHESIA/SEDATION: Moderate (conscious) sedation was employed during this procedure as administered by the Interventional Radiology RN. A total of Versed 1 mg and Fentanyl 25 mcg was administered intravenously. Moderate Sedation Time: 16 minutes. The patient's level of consciousness and vital signs were monitored continuously by radiology nursing throughout the procedure under my direct supervision. CONTRAST:  None. COMPLICATIONS: None immediate. PROCEDURE: RADIATION DOSE REDUCTION: This exam was performed according to the departmental dose-optimization program which includes automated exposure control, adjustment of the mA and/or kV according to patient size and/or use of iterative reconstruction technique. Informed consent was obtained from the patient following an explanation of the procedure, risks, benefits and alternatives. A time out was performed prior to the initiation of the procedure. The patient was positioned supine on the CT table and a limited CT was performed for procedural planning demonstrating unchanged size and appearance of the approximately 4.6 x 3.1 cm partially calcified mesenteric mass (image 20, series 4). The procedure was planned. The operative site was prepped and draped in the usual sterile fashion. Appropriate trajectory was confirmed with a 22 gauge spinal needle after the adjacent tissues were anesthetized with 1% Lidocaine with epinephrine. Under  intermittent CT guidance, a 17 gauge coaxial needle was advanced into the peripheral aspect of the mass. Note was made to access the caudal left side of the mesenteric mass as a portion of the SMA was noted to course through the right-side of the mesenteric mass on CTA performed 11/29/2021. Appropriate positioning was confirmed and 6 samples were obtained with an 18 gauge core needle biopsy device. The co-axial needle was removed following administration of a Gel-Foam slurry and superficial hemostasis was achieved with manual compression. A limited postprocedural CT was negative for hemorrhage or additional complication. A dressing was applied. The patient tolerated the procedure well without immediate postprocedural complication. IMPRESSION: Technically successful CT guided core needle biopsy of indeterminate partially calcified mesenteric mass. Electronically Signed   By: Sandi Mariscal M.D.   On: 01/14/2022 14:26   NM PET Image Initial (PI) Skull Base To Thigh  Result Date: 12/16/2021 CLINICAL DATA:  Initial treatment strategy for mesenteric mass. EXAM: NUCLEAR MEDICINE PET SKULL BASE TO THIGH TECHNIQUE: 8.4 mCi F-18 FDG was injected intravenously. Full-ring PET imaging was performed from the skull base to thigh after the radiotracer. CT data was obtained and used for attenuation correction and anatomic localization. Fasting blood glucose: 99 mg/dl COMPARISON:  CT angiography of the chest, abdomen and pelvis 11/29/2021. FINDINGS: Mediastinal blood pool activity: SUV max 3.0 Liver activity: SUV max 3.5 NECK: No abnormal hypermetabolism. Incidental CT findings: None. CHEST: No abnormal hypermetabolism. Incidental CT findings: Atherosclerotic calcification of the aorta, aortic  valve and coronary arteries. Heart is enlarged. No pericardial or pleural effusion. ABDOMEN/PELVIS: No abnormal hypermetabolism in the liver, adrenal glands, spleen or pancreas. Hypermetabolic 1.7 cm retrocrural lymph node (4/96), SUV max  7.2. Left periaortic adenopathy measures up to 1.3 cm (4/119), SUV max 6.2. Mildly hypermetabolic mesenteric lymph nodes. Index 1.4 cm lymph node adjacent to the superior mesenteric artery (4/123), SUV max 3.2. Hypermetabolic partially calcified mesenteric mass measures 3.1 x 5.7 cm, SUV max 6.8. Incidental CT findings: Liver is unremarkable. Cholecystectomy. Adrenal glands are unremarkable. Parenchymal calcification in the upper pole right kidney. 1.5 cm hyperdense lesion in the upper pole left kidney. No specific follow-up necessary. Spleen, pancreas, stomach and bowel are unremarkable. SKELETON: No abnormal hypermetabolism. Incidental CT findings: Left hip arthroplasty. Degenerative changes in the spine. Old left rib fractures. Right shoulder arthroplasty. IMPRESSION: 1. Hypermetabolic mesenteric mass with hypermetabolic adenopathy in the small bowel mesentery, abdominal retroperitoneum and retrocrural stations. If due to lymphoma, exam is categorized as [place] 4. Carcinoid not excluded. 2. Aortic atherosclerosis (ICD10-I70.0). Coronary artery calcification. Electronically Signed   By: Lorin Picket M.D.   On: 12/16/2021 14:00    ASSESSMENT & PLAN Sabrina Deleon 71 y.o. female with medical history significant for newly diagnosed neuroendocrine tumor who presents for a follow up visit.  #Metastatic Well Differentiated Neuroendocrine Tumor of the GI Tract -- CT imaging and PET CT scan confirmed widespread lymphadenopathy consistent with metastatic spread of well-differentiated neuroendocrine tumor. -- Biopsy confirms this is a well differentiated neuroendocrine tumor of the GI tract. -- Today we will proceed with lanreotide 120 mg IM q. 28 days. -- Patient still requires nuclear medicine dotatate scan.  Provided patient with number to have this scheduled.  We will plan for imaging every 3 months for starters and is stable every 6 months. -- Have patient return to clinic in 4 weeks for repeat shot and  continued evaluation.  If tolerated well can consider extending to every 3 month clinic visits with monthly labs/injections.  No orders of the defined types were placed in this encounter.   All questions were answered. The patient knows to call the clinic with any problems, questions or concerns.  A total of more than 30 minutes were spent on this encounter with face-to-face time and non-face-to-face time, including preparing to see the patient, ordering tests and/or medications, counseling the patient and coordination of care as outlined above.   Ledell Peoples, MD Department of Hematology/Oncology [place] [facility] Phone: [phone] Pager: [phone] Email: [email or web address]  01/10/2022 1:49 PM

## 2022-01-11 ENCOUNTER — Telehealth: Payer: Self-pay | Admitting: Hematology and Oncology

## 2022-01-11 NOTE — Telephone Encounter (Signed)
Per 10/24 los called and spoke to pt about appointments

## 2022-01-12 ENCOUNTER — Telehealth: Payer: Self-pay | Admitting: Physician Assistant

## 2022-01-12 NOTE — Telephone Encounter (Signed)
  [place] VALVE TEAM  Patient underwent CT angiogram C/A/P prior to consideration of TAVR for severe aortic stenosis, noted to have bulky central mesenteric soft tissue mass that has increased in 2019.  Additionally there was new widespread mild to moderate retroperitoneal, retrocrural, mediastinal and bilateral hilar lymphadenopathy. She was diagnosed with well differentiated neuroendocrine tumor and is now under the care of Dr. Randie Heinz. She is being treated with lanreotide '120mg'$  IM q 28 days.   Pt cancelled her apt with Dr. Cyndia Bent 12/23/21 as she was overwhelmed with all of her oncologic work up. She is not having any symptoms of chest pain, SOB or dizziness/syncope. She would like to wait until the first of the year to start thinking about potential valve replacement. She will call us back sooner if she has any change in symptoms.   Angelena Form PA-C  [facility]

## 2022-01-13 ENCOUNTER — Emergency Department (HOSPITAL_COMMUNITY)
Admission: EM | Admit: 2022-01-13 | Discharge: 2022-01-13 | Disposition: A | Discharge status: Home / Self Care | Payer: Medicare Other | Attending: Emergency Medicine | Admitting: Emergency Medicine

## 2022-01-13 ENCOUNTER — Emergency Department (HOSPITAL_COMMUNITY): Payer: Medicare Other

## 2022-01-13 ENCOUNTER — Other Ambulatory Visit: Payer: Self-pay

## 2022-01-13 ENCOUNTER — Encounter (HOSPITAL_COMMUNITY): Payer: Self-pay | Admitting: *Deleted

## 2022-01-13 DIAGNOSIS — Z7982 Long term (current) use of aspirin: Secondary | ICD-10-CM | POA: Insufficient documentation

## 2022-01-13 DIAGNOSIS — I129 Hypertensive chronic kidney disease with stage 1 through stage 4 chronic kidney disease, or unspecified chronic kidney disease: Secondary | ICD-10-CM | POA: Diagnosis not present

## 2022-01-13 DIAGNOSIS — S43005A Unspecified dislocation of left shoulder joint, initial encounter: Secondary | ICD-10-CM | POA: Insufficient documentation

## 2022-01-13 DIAGNOSIS — I6381 Other cerebral infarction due to occlusion or stenosis of small artery: Secondary | ICD-10-CM | POA: Diagnosis not present

## 2022-01-13 DIAGNOSIS — S4992XA Unspecified injury of left shoulder and upper arm, initial encounter: Secondary | ICD-10-CM | POA: Diagnosis present

## 2022-01-13 DIAGNOSIS — Z79899 Other long term (current) drug therapy: Secondary | ICD-10-CM | POA: Insufficient documentation

## 2022-01-13 DIAGNOSIS — M25512 Pain in left shoulder: Secondary | ICD-10-CM | POA: Diagnosis not present

## 2022-01-13 DIAGNOSIS — M25552 Pain in left hip: Secondary | ICD-10-CM | POA: Diagnosis not present

## 2022-01-13 DIAGNOSIS — E039 Hypothyroidism, unspecified: Secondary | ICD-10-CM | POA: Diagnosis not present

## 2022-01-13 DIAGNOSIS — W19XXXD Unspecified fall, subsequent encounter: Secondary | ICD-10-CM

## 2022-01-13 DIAGNOSIS — S199XXA Unspecified injury of neck, initial encounter: Secondary | ICD-10-CM | POA: Diagnosis not present

## 2022-01-13 DIAGNOSIS — E1122 Type 2 diabetes mellitus with diabetic chronic kidney disease: Secondary | ICD-10-CM | POA: Diagnosis not present

## 2022-01-13 DIAGNOSIS — N189 Chronic kidney disease, unspecified: Secondary | ICD-10-CM | POA: Insufficient documentation

## 2022-01-13 DIAGNOSIS — W01198A Fall on same level from slipping, tripping and stumbling with subsequent striking against other object, initial encounter: Secondary | ICD-10-CM | POA: Insufficient documentation

## 2022-01-13 DIAGNOSIS — S0990XA Unspecified injury of head, initial encounter: Secondary | ICD-10-CM | POA: Insufficient documentation

## 2022-01-13 DIAGNOSIS — M47812 Spondylosis without myelopathy or radiculopathy, cervical region: Secondary | ICD-10-CM | POA: Diagnosis not present

## 2022-01-13 LAB — CHROMOGRANIN A: Chromogranin A (ng/mL): 312.9 ng/mL — ABNORMAL HIGH (ref 0.0–101.8)

## 2022-01-13 MED ORDER — HYDROMORPHONE HCL 1 MG/ML IJ SOLN
0.5000 mg | Freq: Once | INTRAMUSCULAR | Status: AC
  Administered 2022-01-13: 0.5 mg via INTRAVENOUS
  Filled 2022-01-13: qty 0.5

## 2022-01-13 NOTE — ED Triage Notes (Signed)
Pt states she fell in the driveway while trying to get away from a bee; pt went to [facility] after the fall and was diagnosed with broken left clavicle and the ortho doctors wanted pt to come to ED to have a head CT;  Pt admits to hitting her head when she fell; pt denies any LOC

## 2022-01-13 NOTE — Discharge Instructions (Addendum)
Your imaging was reassuring today. Please take tylenol/ibuprofen for pain. I recommend close follow-up with your PCP for reevaluation.  Please do not hesitate to return to emergency department if worrisome signs symptoms we discussed become apparent.

## 2022-01-13 NOTE — ED Provider Notes (Signed)
[facility] EMERGENCY DEPARTMENT Provider Note   CSN: [id number] Arrival date & time: 01/13/22  1720     History {Add pertinent medical, surgical, social history, OB history to HPI:1} Chief Complaint  Patient presents with   Sabrina Deleon is a 71 y.o. female with a past medical history of hypertension, hyperlipidemia, hypothyroidism, type 2 diabetes, CKD presenting to the emergency department for evaluation after fall.  Patient states she fell in her driveway while trying to get away from a bee.  States she fell on her left side, hit her head on concrete, denies LOC.  Patient was unable to get up after the fall and had to get help from her husband to get up.  Patient is able to ambulate now.  She is taking aspirin 81 mg every day.  Patient went to [facility] after the fall and was diagnosed with a broken left clavicle.  Denies nausea, vomiting, visual changes, fever, chest pain, shortness of breath, rash.  Fall       Home Medications Prior to Admission medications   Medication Sig Start Date End Date Taking? Authorizing Provider  allopurinol (ZYLOPRIM) 100 MG tablet TAKE 1 TABLET(100 MG) BY MOUTH TWICE DAILY 11/11/21   Kathyrn Drown, MD  aspirin EC 81 MG tablet Take 81 mg by mouth at bedtime.    [provider]  calcium carbonate (TUMS - DOSED IN MG ELEMENTAL CALCIUM) 500 MG chewable tablet Chew 1-2 tablets by mouth daily as needed for indigestion or heartburn.    [provider]  Cholecalciferol (VITAMIN D) 50 MCG (2000 UT) tablet Take 2,000 Units by mouth daily.    [provider]  citalopram (CELEXA) 20 MG tablet TAKE 1 AND 1/2 TABLETS BY MOUTH EVERY DAY 05/30/21   Kathyrn Drown, MD  ferrous sulfate 325 (65 FE) MG tablet Take 325 mg by mouth daily with breakfast.    [provider]  furosemide (LASIX) 40 MG tablet TAKE 1 TABLET(40 MG) BY MOUTH DAILY 01/10/22   Kathyrn Drown, MD  HYDROcodone-acetaminophen (NORCO/VICODIN) 5-325 MG  tablet Take one tab po BID prn pain 12/03/21   Nilda Simmer, NP  ketoconazole (NIZORAL) 2 % cream Apply 1 Application topically daily as needed (fungus).    [provider]  levothyroxine (SYNTHROID) 125 MCG tablet Take 1 tablet (125 mcg total) by mouth daily before breakfast. 11/24/21   Kathyrn Drown, MD  lisinopril (ZESTRIL) 20 MG tablet Take 10 mg by mouth daily.    [provider]  MISC NATURAL PRODUCTS PO Take 1 tablet by mouth daily. Super Beets otc supplement    [provider]  ondansetron (ZOFRAN) 8 MG tablet Take 1 tablet (8 mg total) by mouth every 8 (eight) hours as needed. 01/10/22   Orson Slick, MD  [facility] ULTRA test strip TEST ONCE DAILY 04/19/21   Kathyrn Drown, MD  pantoprazole (PROTONIX) 40 MG tablet TAKE 1 TABLET(40 MG) BY MOUTH DAILY Patient taking differently: Take 40 mg by mouth daily. 03/08/21   Kathyrn Drown, MD  potassium chloride (KLOR-CON M) 10 MEQ tablet TAKE 1 TABLET BY MOUTH EVERY DAY ON MONDAY, [facility] AND FRIDAY 12/26/21   Kathyrn Drown, MD  rosuvastatin (CRESTOR) 10 MG tablet TAKE 1 TABLET(10 MG) BY MOUTH DAILY FOR CHOLESTEROL. STOP WHILE TAKING COLCHICINE FOR GOUT 08/17/21   Kathyrn Drown, MD  zolpidem (AMBIEN) 5 MG tablet Take 1 tablet (5 mg total) by mouth at bedtime.  09/30/21   Nilda Simmer, NP      Allergies    Patient has no known allergies.    Review of Systems   Review of Systems  Physical Exam Updated Vital Signs BP (!) 149/58   Pulse (!) 58   Temp 98.7 F (37.1 C) (Oral)   Resp 20   Ht 5' 5.75" (1.67 m)   Wt 76.1 kg   SpO2 99%   BMI 27.29 kg/m  Physical Exam Vitals and nursing note reviewed.  Constitutional:      Appearance: Normal appearance.  HENT:     Head: Normocephalic and atraumatic.     Mouth/Throat:     Mouth: Mucous membranes are moist.  Eyes:     General: No scleral icterus. Neck:     Comments: TTP to left-sided neck and left shoulder. L arm is in an arm string.   Negative midline tenderness. Cardiovascular:     Rate and Rhythm: Normal rate and regular rhythm.     Pulses: Normal pulses.     Heart sounds: Normal heart sounds.  Pulmonary:     Effort: Pulmonary effort is normal.     Breath sounds: Normal breath sounds.  Abdominal:     General: Abdomen is flat.     Palpations: Abdomen is soft.     Tenderness: There is no abdominal tenderness.  Musculoskeletal:        General: No deformity.  Skin:    General: Skin is warm.     Findings: No rash.     Comments: Old hematoma on right upper arm due to prior fall last week.  Left hip appears swollen with no hematoma.  No bruises noted on her head.  No hematoma visualized elsewhere.  Neurological:     General: No focal deficit present.     Mental Status: She is alert.  Psychiatric:        Mood and Affect: Mood normal.     ED Results / Procedures / Treatments   Labs (all labs ordered are listed, but only abnormal results are displayed) Labs Reviewed - No data to display  EKG None  Radiology No results found.  Procedures Procedures  {Document cardiac monitor, telemetry assessment procedure when appropriate:1}  Medications Ordered in ED Medications - No data to display  ED Course/ Medical Decision Making/ A&P                           Medical Decision Making Amount and/or Complexity of Data Reviewed Radiology: ordered.   This patient presents to the ED for concern of injuries after fall, this involves an extensive number of treatment options, and is a complaint that carries with it a high risk of complications and morbidity.  The differential diagnosis includes head bleeds, skull fracture, neck fracture, clavicle fracture, arm fracture, hip fracture.  Co morbidities that complicate the patient evaluation  See HPI Additional history obtained:  Additional history obtained from EMR External records from outside source obtained and reviewed  Lab Tests:  na Imaging Studies  ordered:  I ordered imaging studies including: CT head and Ct cervical spine. Patient was found to have L clavicle fracture and no hip fracture at [facility] so declined to have additional imaging on her clavicle or hip.  I independently visualized and interpreted imaging. I agree with the radiologist interpretation Cardiac Monitoring: / EKG:  The patient was maintained on a cardiac monitor.  I personally viewed and interpreted the cardiac  monitored which showed an underlying rhythm of: sinus rhythm Consultations Obtained:  I requested consultation with the ***,  and discussed lab and imaging findings as well as pertinent plan - they recommend: *** Problem List / ED Course / Critical interventions / Medication management  *** Vitals signs {significant for ***. Otherwise within normal range and stable throughout visit./within normal range and stable throughout visit.:23923::"within normal range and stable throughout visit"} Laboratory/imaging studies significant for: See above On physical examination, patient is afebrile and appears in no acute distress. Patient's presentations are most concerned for ***. Low suspicion for ***. I ordered medication including ***  Reevaluation of the patient after these medicines showed that the patient {resolved/improved/worsened:23923::"improved"} I have reviewed the patients home medicines and have made adjustments as needed Continued outpatient therapy. Follow-up with *** recommended for reevaluation of symptoms. Treatment plan discussed with patient.  Pt acknowledged understanding was agreeable to the plan. Social Determinants of Health:  N/A Test / Admission / Dispo - Considered:  Worrisome signs and symptoms were discussed with patient, and patient acknowledged understanding to return to the ED if they noticed these signs and symptoms. Patient was stable upon discharge.    {Document critical care time when appropriate:1} {Document review of labs and  clinical decision tools ie heart score, Chads2Vasc2 etc:1}  {Document your independent review of radiology images, and any outside records:1} {Document your discussion with family members, caretakers, and with consultants:1} {Document social determinants of health affecting pt's care:1} {Document your decision making why or why not admission, treatments were needed:1} Final Clinical Impression(s) / ED Diagnoses Final diagnoses:  None    Rx / DC Orders ED Discharge Orders     None

## 2022-01-18 ENCOUNTER — Other Ambulatory Visit (HOSPITAL_COMMUNITY): Payer: Medicare Other

## 2022-01-18 ENCOUNTER — Ambulatory Visit (HOSPITAL_COMMUNITY): Payer: Medicare Other

## 2022-01-19 ENCOUNTER — Telehealth: Payer: Self-pay | Admitting: *Deleted

## 2022-01-19 DIAGNOSIS — S42002A Fracture of unspecified part of left clavicle, initial encounter for closed fracture: Secondary | ICD-10-CM | POA: Diagnosis not present

## 2022-01-19 DIAGNOSIS — M25512 Pain in left shoulder: Secondary | ICD-10-CM | POA: Diagnosis not present

## 2022-01-19 NOTE — Telephone Encounter (Signed)
Received call from pt. She states that since her 1st injection of lanreotide, she has been experiencing increased abdominal discomfort. She states her stomach/abdomen feels tight and bubbly/gassy. It is worse after she eats so she is reluctant to eat as much. Spoke to [place], [place] about this. She recommends Bentyl-an antispasmodic as well as Gas X. Murray Hodgkins will send that prescription in to pt's pharmacy. Pt instructed on use of the medications. She voiced understanding.

## 2022-01-20 ENCOUNTER — Other Ambulatory Visit: Payer: Self-pay | Admitting: Physician Assistant

## 2022-01-20 MED ORDER — DICYCLOMINE HCL 10 MG PO CAPS: 10.0000 mg | ORAL_CAPSULE | Freq: Three times a day (TID) | ORAL | 0 refills | Status: DC

## 2022-01-30 ENCOUNTER — Other Ambulatory Visit: Payer: Self-pay | Admitting: Family Medicine

## 2022-01-30 ENCOUNTER — Telehealth: Payer: Self-pay | Admitting: Family Medicine

## 2022-01-30 MED ORDER — ZOLPIDEM TARTRATE 5 MG PO TABS: 5.0000 mg | ORAL_TABLET | Freq: Every day | ORAL | 2 refills | Status: DC

## 2022-01-30 NOTE — Telephone Encounter (Signed)
Prescription was sent in Do not take hydrocodone in the evening time because hydrocodone and Ambien together can be dangerous

## 2022-01-30 NOTE — Telephone Encounter (Signed)
Patient informed per drs recommendations.

## 2022-01-30 NOTE — Telephone Encounter (Signed)
Patient is requesting refill on zolpidem (AMBIEN) 5 MG tablet  to be sent to [facility] [address]

## 2022-02-02 DIAGNOSIS — S42002D Fracture of unspecified part of left clavicle, subsequent encounter for fracture with routine healing: Secondary | ICD-10-CM | POA: Diagnosis not present

## 2022-02-04 ENCOUNTER — Other Ambulatory Visit: Payer: Self-pay | Admitting: Family Medicine

## 2022-02-10 ENCOUNTER — Other Ambulatory Visit: Payer: Self-pay

## 2022-02-10 ENCOUNTER — Inpatient Hospital Stay: Payer: Medicare Other | Attending: Physician Assistant

## 2022-02-10 ENCOUNTER — Other Ambulatory Visit: Payer: Self-pay | Admitting: Hematology and Oncology

## 2022-02-10 ENCOUNTER — Inpatient Hospital Stay: Payer: Medicare Other

## 2022-02-10 ENCOUNTER — Inpatient Hospital Stay: Payer: Medicare Other | Admitting: Hematology and Oncology

## 2022-02-10 VITALS — BP 122/95 | HR 74 | Temp 97.5°F | Resp 15 | Wt 163.7 lb

## 2022-02-10 DIAGNOSIS — C7A00 Malignant carcinoid tumor of unspecified site: Secondary | ICD-10-CM | POA: Diagnosis not present

## 2022-02-10 DIAGNOSIS — K6389 Other specified diseases of intestine: Secondary | ICD-10-CM

## 2022-02-10 DIAGNOSIS — C7A8 Other malignant neuroendocrine tumors: Secondary | ICD-10-CM

## 2022-02-10 DIAGNOSIS — C7B01 Secondary carcinoid tumors of distant lymph nodes: Secondary | ICD-10-CM | POA: Diagnosis not present

## 2022-02-10 LAB — CMP (CANCER CENTER ONLY)
ALT: 25 U/L (ref 0–44)
AST: 50 U/L — ABNORMAL HIGH (ref 15–41)
Albumin: 4.4 g/dL (ref 3.5–5.0)
Alkaline Phosphatase: 76 U/L (ref 38–126)
Anion gap: 3 — ABNORMAL LOW (ref 5–15)
BUN: 30 mg/dL — ABNORMAL HIGH (ref 8–23)
CO2: 32 mmol/L (ref 22–32)
Calcium: 10.7 mg/dL — ABNORMAL HIGH (ref 8.9–10.3)
Chloride: 103 mmol/L (ref 98–111)
Creatinine: 1.55 mg/dL — ABNORMAL HIGH (ref 0.44–1.00)
GFR, Estimated: 36 mL/min — ABNORMAL LOW
Glucose, Bld: 149 mg/dL — ABNORMAL HIGH (ref 70–99)
Potassium: 4.3 mmol/L (ref 3.5–5.1)
Sodium: 138 mmol/L (ref 135–145)
Total Bilirubin: 0.7 mg/dL (ref 0.3–1.2)
Total Protein: 7.2 g/dL (ref 6.5–8.1)

## 2022-02-10 LAB — CBC WITH DIFFERENTIAL (CANCER CENTER ONLY)
Abs Immature Granulocytes: 0.01 10*3/uL (ref 0.00–0.07)
Basophils Absolute: 0.1 10*3/uL (ref 0.0–0.1)
Basophils Relative: 1 %
Eosinophils Absolute: 0.7 10*3/uL — ABNORMAL HIGH (ref 0.0–0.5)
Eosinophils Relative: 12 %
HCT: 28.6 % — ABNORMAL LOW (ref 36.0–46.0)
Hemoglobin: 9.2 g/dL — ABNORMAL LOW (ref 12.0–15.0)
Immature Granulocytes: 0 %
Lymphocytes Relative: 23 %
Lymphs Abs: 1.3 10*3/uL (ref 0.7–4.0)
MCH: 30.5 pg (ref 26.0–34.0)
MCHC: 32.2 g/dL (ref 30.0–36.0)
MCV: 94.7 fL (ref 80.0–100.0)
Monocytes Absolute: 0.4 10*3/uL (ref 0.1–1.0)
Monocytes Relative: 7 %
Neutro Abs: 3.3 10*3/uL (ref 1.7–7.7)
Neutrophils Relative %: 57 %
Platelet Count: 195 10*3/uL (ref 150–400)
RBC: 3.02 MIL/uL — ABNORMAL LOW (ref 3.87–5.11)
RDW: 14.8 % (ref 11.5–15.5)
WBC Count: 5.7 10*3/uL (ref 4.0–10.5)
nRBC: 0 % (ref 0.0–0.2)

## 2022-02-10 MED ORDER — LANREOTIDE ACETATE 120 MG/0.5ML ~~LOC~~ SOLN
120.0000 mg | Freq: Once | SUBCUTANEOUS | Status: AC
  Administered 2022-02-10: 120 mg via SUBCUTANEOUS
  Filled 2022-02-10: qty 120

## 2022-02-10 NOTE — Progress Notes (Signed)
[place] [phone]   [phone]  PROGRESS NOTE  Patient Care Team: Kathyrn Drown, MD as PCP - General (Family Medicine) Satira Sark, MD as PCP - Cardiology (Cardiology)  Hematological/Oncological History # Well Differentiated Neuroendocrine Tumor, GI origin. Grade 2.  11/29/2021: Patient underwent CT angiogram C/A/P prior to consideration of TAVR for severe aortic stenosis, noted to have bulky central mesenteric soft tissue mass that has increased in 2019.  Additionally there was new widespread mild to moderate retroperitoneal, retrocrural, mediastinal and bilateral hilar lymphadenopathy 12/15/2021: NM PET CT Scan showed hypermetabolic mesenteric mass with hypermetabolic adenopathy in the small bowel mesentery, abdominal retroperitoneum and retrocrural stations. 12/28/2021: IR guided biopsy of the mesenteric mass was consistent with well differentiated neuroendocrine tumor 01/10/2022: start of lanreotide '120mg'$  IM q 28 days.   Interval History:  Sabrina Deleon 71 y.o. female with medical history significant for newly diagnosed neuroendocrine tumor who presents for a follow up visit. The patient's last visit was on 01/10/2022. In the interim since the last visit she started lanreotide treatment but has not yet undergone her PET dotatate.   On exam today Sabrina Deleon reports she is tolerating the lanreotide shots quite well.  She reports that she had her Thanksgiving dinner at [place].  She notes that overall her health has been quite steady.  She does have good days with some occasional bad days.  She is still try to get used to the injections.  She did have some abdominal cramping with her last set of shots.  She notes the nausea is rare but she is not having any vomiting.  She is also not having any diarrhea.  She reports that she unfortunately had another fall in the interim since her last visit.  She had a clavicle broken in 5 places and  currently has her left arm in a sling.  She is not taking any pain medication other than some occasional Tylenol.  She does have a dry cough which is predominantly occurring at night.  She notes that her energy is good.  She is not having any issues with the injection site such as rash, itching, or soreness. Full 10 point ROS was otherwise negative.  Today we reemphasized the importance of her dotatate scan.  MEDICAL HISTORY:  Past Medical History:  Diagnosis Date   Anxiety    Aortic stenosis    Arthritis    Chronic kidney disease    Dr. Tami Ribas- noted increase kidney function levels- took pt off Metformin and started on Onglyza for Blood sugar control-pt being followed for this.   GERD (gastroesophageal reflux disease)    Gout    Hypertension    Hypothyroidism    Morbid obesity ([place]) 03/29/2018   Patient has elevated BMI with diabetes hyperlipidemia and hypertension   Pancreatitis    Type 2 diabetes mellitus ([place])    Wears glasses    Wears partial dentures    Upper    SURGICAL HISTORY: Past Surgical History:  Procedure Laterality Date   BACK SURGERY     Lumbar   CARPAL TUNNEL RELEASE  11/02/2011   Procedure: CARPAL TUNNEL RELEASE;  Surgeon: Tennis Must, MD;  Location: [place];  Service: Orthopedics;  Laterality: Right;   CARPAL TUNNEL RELEASE Left 01/14/2015   Procedure: LEFT CARPAL TUNNEL RELEASE;  Surgeon: Leanora Cover, MD;  Location: [place];  Service: Orthopedics;  Laterality: Left;   CHOLECYSTECTOMY     laparoscopic  COLONOSCOPY     COLONOSCOPY N/A 11/12/2015   Procedure: COLONOSCOPY;  Surgeon: Rogene Houston, MD;  [facility];  Service: Endoscopy;  Laterality: N/A;  9:30   ESOPHAGOGASTRODUODENOSCOPY N/A 11/12/2015   Procedure: ESOPHAGOGASTRODUODENOSCOPY (EGD);  Surgeon: Rogene Houston, MD;  [facility];  Service: Endoscopy;  Laterality: N/A;   REVERSE SHOULDER ARTHROPLASTY Right 11/04/2018   REVERSE  SHOULDER ARTHROPLASTY Right 11/04/2018   Procedure: REVERSE SHOULDER ARTHROPLASTY;  Surgeon: Verner Mould, MD;  Location: [place];  Service: Orthopedics;  Laterality: Right;   RIGHT/LEFT HEART CATH AND CORONARY ANGIOGRAPHY N/A 10/18/2017   Procedure: RIGHT/LEFT HEART CATH AND CORONARY ANGIOGRAPHY;  Surgeon: Burnell Blanks, MD;  Location: [place] [facility];  Service: Cardiovascular;  Laterality: N/A;   TOTAL HIP ARTHROPLASTY Left 03/22/2016   Procedure: LEFT TOTAL HIP ARTHROPLASTY ANTERIOR APPROACH;  Surgeon: Gaynelle Arabian, MD;  Location: [facility];  Service: Orthopedics;  Laterality: Left;    SOCIAL HISTORY: Social History   Socioeconomic History   Marital status: Married    Spouse name: Not on file   Number of children: Not on file   Years of education: Not on file   Highest education level: Not on file  Occupational History   Not on file  Tobacco Use   Smoking status: Former    Types: Cigarettes    Quit date: 10/29/1981    Years since quitting: 40.3   Smokeless tobacco: Never  Vaping Use   Vaping Use: Never used  Substance and Sexual Activity   Alcohol use: Yes    Comment: occasionally   Drug use: No   Sexual activity: Not Currently  Other Topics Concern   Not on file  Social History Narrative   Not on file   Social Determinants of Health   Financial Resource Strain: Not on file  Food Insecurity: Not on file  Transportation Needs: Not on file  Physical Activity: Not on file  Stress: Not on file  Social Connections: Not on file  Intimate Partner Violence: Not on file    FAMILY HISTORY: Family History  Problem Relation Age of Onset   Breast cancer Mother    Lung cancer Mother        likely recurrent breast cancer   Breast cancer Paternal Grandmother    Head & neck cancer Nephew     ALLERGIES:  has No Known Allergies.  MEDICATIONS:  Current Outpatient Medications  Medication Sig Dispense Refill   allopurinol (ZYLOPRIM) 100 MG tablet TAKE 1  TABLET(100 MG) BY MOUTH TWICE DAILY 180 tablet 1   aspirin EC 81 MG tablet Take 81 mg by mouth at bedtime.     calcium carbonate (TUMS - DOSED IN MG ELEMENTAL CALCIUM) 500 MG chewable tablet Chew 1-2 tablets by mouth daily as needed for indigestion or heartburn.     Cholecalciferol (VITAMIN D) 50 MCG (2000 UT) tablet Take 2,000 Units by mouth daily.     citalopram (CELEXA) 20 MG tablet TAKE 1 AND 1/2 TABLETS BY MOUTH EVERY DAY 135 tablet 1   dicyclomine (BENTYL) 10 MG capsule Take 1 capsule (10 mg total) by mouth 3 (three) times daily before meals. 90 capsule 0   ferrous sulfate 325 (65 FE) MG tablet Take 325 mg by mouth daily with breakfast.     furosemide (LASIX) 40 MG tablet TAKE 1 TABLET(40 MG) BY MOUTH DAILY 90 tablet 1   HYDROcodone-acetaminophen (NORCO/VICODIN) 5-325 MG tablet Take one tab po BID prn pain 45  tablet 0   ketoconazole (NIZORAL) 2 % cream Apply 1 Application topically daily as needed (fungus).     levothyroxine (SYNTHROID) 125 MCG tablet Take 1 tablet (125 mcg total) by mouth daily before breakfast. 90 tablet 1   lisinopril (ZESTRIL) 20 MG tablet TAKE 1 TABLET(20 MG) BY MOUTH DAILY 90 tablet 1   MISC NATURAL PRODUCTS PO Take 1 tablet by mouth daily. Super Beets otc supplement     ondansetron (ZOFRAN) 8 MG tablet Take 1 tablet (8 mg total) by mouth every 8 (eight) hours as needed. 30 tablet 0   ONETOUCH ULTRA test strip TEST ONCE DAILY 50 strip 5   pantoprazole (PROTONIX) 40 MG tablet TAKE 1 TABLET(40 MG) BY MOUTH DAILY (Patient taking differently: Take 40 mg by mouth daily.) 90 tablet 1   potassium chloride (KLOR-CON M) 10 MEQ tablet TAKE 1 TABLET BY MOUTH EVERY DAY ON MONDAY, WEDNESDAY AND FRIDAY 14 tablet 6   rosuvastatin (CRESTOR) 10 MG tablet TAKE 1 TABLET(10 MG) BY MOUTH DAILY FOR CHOLESTEROL. STOP WHILE TAKING COLCHICINE FOR GOUT 90 tablet 1   zolpidem (AMBIEN) 5 MG tablet Take 1 tablet (5 mg total) by mouth at bedtime. 30 tablet 2   No current facility-administered  medications for this visit.    REVIEW OF SYSTEMS:   Constitutional: ( - ) fevers, ( - )  chills , ( - ) night sweats Eyes: ( - ) blurriness of vision, ( - ) double vision, ( - ) watery eyes Ears, nose, mouth, throat, and face: ( - ) mucositis, ( - ) sore throat Respiratory: ( - ) cough, ( - ) dyspnea, ( - ) wheezes Cardiovascular: ( - ) palpitation, ( - ) chest discomfort, ( - ) lower extremity swelling Gastrointestinal:  ( - ) nausea, ( - ) heartburn, ( - ) change in bowel habits Skin: ( - ) abnormal skin rashes Lymphatics: ( - ) new lymphadenopathy, ( - ) easy bruising Neurological: ( - ) numbness, ( - ) tingling, ( - ) new weaknesses Behavioral/Psych: ( - ) mood change, ( - ) new changes  All other systems were reviewed with the patient and are negative.  PHYSICAL EXAMINATION: ECOG PERFORMANCE STATUS: 1 - Symptomatic but completely ambulatory  Vitals:   02/10/22 1006  BP: (!) 122/95  Pulse: 74  Resp: 15  Temp: (!) 97.5 F (36.4 C)  SpO2: 100%   Filed Weights   02/10/22 1006  Weight: 163 lb 11.2 oz (74.3 kg)    GENERAL: Well-appearing elderly Caucasian female, alert, no distress and comfortable SKIN: skin color, texture, turgor are normal, no rashes or significant lesions EYES: conjunctiva are pink and non-injected, sclera clear LUNGS: clear to auscultation and percussion with normal breathing effort HEART: regular rate & rhythm and no murmurs and no lower extremity edema Musculoskeletal: no cyanosis of digits and no clubbing  PSYCH: alert & oriented x 3, fluent speech NEURO: no focal motor/sensory deficits  LABORATORY DATA:  I have reviewed the data as listed    Latest Ref Rng & Units 02/10/2022    9:41 AM 01/10/2022    9:29 AM 12/28/2021    8:36 AM  CBC  WBC 4.0 - 10.5 K/uL 5.7  6.6  5.5   Hemoglobin 12.0 - 15.0 g/dL 9.2  10.2  11.0   Hematocrit 36.0 - 46.0 % 28.6  31.4  33.1   Platelets 150 - 400 K/uL 195  202  153        Latest Ref  Rng & Units 02/10/2022     9:41 AM 01/10/2022    9:29 AM 12/13/2021    1:14 PM  CMP  Glucose 70 - 99 mg/dL 149  101  105   BUN 8 - 23 mg/dL '30  23  26   '$ Creatinine 0.44 - 1.00 mg/dL 1.55  1.27  1.17   Sodium 135 - 145 mmol/L 138  140  141   Potassium 3.5 - 5.1 mmol/L 4.3  4.3  3.8   Chloride 98 - 111 mmol/L 103  107  108   CO2 22 - 32 mmol/L 32  28  30   Calcium 8.9 - 10.3 mg/dL 10.7  10.6  10.0   Total Protein 6.5 - 8.1 g/dL 7.2  7.4  7.2   Total Bilirubin 0.3 - 1.2 mg/dL 0.7  0.6  0.7   Alkaline Phos 38 - 126 U/L 76  58  53   AST 15 - 41 U/L 50  33  36   ALT 0 - 44 U/L '25  16  19    '$ RADIOGRAPHIC STUDIES: CT Cervical Spine Wo Contrast  Result Date: 01/13/2022 CLINICAL DATA:  Tripped and fell today trying to get away from bee EXAM: CT CERVICAL SPINE WITHOUT CONTRAST TECHNIQUE: Multidetector CT imaging of the cervical spine was performed without intravenous contrast. Multiplanar CT image reconstructions were also generated. RADIATION DOSE REDUCTION: This exam was performed according to the departmental dose-optimization program which includes automated exposure control, adjustment of the mA and/or kV according to patient size and/or use of iterative reconstruction technique. COMPARISON:  None Available. FINDINGS: Alignment: Alignment is grossly anatomic. Skull base and vertebrae: No acute fracture. No primary bone lesion or focal pathologic process. Soft tissues and spinal canal: No prevertebral fluid or swelling. No visible canal hematoma. Disc levels: Mild C5-6 and C6-7 spondylosis. Right predominant facet hypertrophy at C2-3, C3-4, and C4-5. Right predominant neural foraminal narrowing at C3-4. Upper chest: Airway is patent.  Lung apices are clear. Other: Reconstructed images demonstrate no additional findings. IMPRESSION: 1. No acute cervical spine fracture. 2. Multilevel degenerative changes as above. Electronically Signed   By: Randa Ngo M.D.   On: 01/13/2022 18:48   CT Head Wo Contrast  Result Date:  01/13/2022 CLINICAL DATA:  [place], trauma EXAM: CT HEAD WITHOUT CONTRAST TECHNIQUE: Contiguous axial images were obtained from the base of the skull through the vertex without intravenous contrast. RADIATION DOSE REDUCTION: This exam was performed according to the departmental dose-optimization program which includes automated exposure control, adjustment of the mA and/or kV according to patient size and/or use of iterative reconstruction technique. COMPARISON:  10/31/2018 FINDINGS: Brain: No acute infarct or hemorrhage. Chronic lacunar infarct within the left basal ganglia unchanged. Lateral ventricles and midline structures are otherwise unremarkable. No acute extra-axial fluid collections. No mass effect. Vascular: No hyperdense vessel or unexpected calcification. Skull: Normal. Negative for fracture or focal lesion. Sinuses/Orbits: No acute finding. Other: None. IMPRESSION: 1. No acute intracranial process. Electronically Signed   By: Randa Ngo M.D.   On: 01/13/2022 18:44    ASSESSMENT & PLAN Sabrina Deleon 71 y.o. female with medical history significant for newly diagnosed neuroendocrine tumor who presents for a follow up visit.  #Metastatic Well Differentiated Neuroendocrine Tumor of the GI Tract -- CT imaging and PET CT scan confirmed widespread lymphadenopathy consistent with metastatic spread of well-differentiated neuroendocrine tumor. -- Biopsy confirms this is a well differentiated neuroendocrine tumor of the GI tract. -- Today we will proceed with  lanreotide 120 mg IM q. 28 days. -- Patient still requires nuclear medicine dotatate scan.  Provided patient with number to have this scheduled.  We will plan for imaging every 3 months for starters and is stable every 6 months.  We are still in need of her baseline dotatate scan. --labs today show white blood cell 5.6, hemoglobin 9.2, MCV 94.7, and platelets of 195 -- Have patient return to clinic in 4 weeks for repeat shot and continued  evaluation.  If tolerated well can consider extending to every 3 month clinic visits with monthly labs/injections.  No orders of the defined types were placed in this encounter.   All questions were answered. The patient knows to call the clinic with any problems, questions or concerns.  A total of more than 30 minutes were spent on this encounter with face-to-face time and non-face-to-face time, including preparing to see the patient, ordering tests and/or medications, counseling the patient and coordination of care as outlined above.   Ledell Peoples, MD Department of Hematology/Oncology [facility] Phone: [phone] Pager: [phone] Email: [email or web address]  02/11/2022 2:23 PM

## 2022-02-11 ENCOUNTER — Encounter: Payer: Self-pay | Admitting: Hematology and Oncology

## 2022-02-13 LAB — CHROMOGRANIN A: Chromogranin A (ng/mL): 137 ng/mL — ABNORMAL HIGH (ref 0.0–101.8)

## 2022-02-15 ENCOUNTER — Other Ambulatory Visit: Payer: Self-pay

## 2022-02-15 MED ORDER — KETOCONAZOLE 2 % EX CREA: 1.0000 | TOPICAL_CREAM | Freq: Every day | CUTANEOUS | 0 refills | Status: DC | PRN

## 2022-02-20 ENCOUNTER — Telehealth: Payer: Self-pay | Admitting: *Deleted

## 2022-02-20 NOTE — Patient Outreach (Signed)
  Care Coordination   02/20/2022 Name: Sabrina Deleon MRN: [id number] DOB: [birth year]   Care Coordination Outreach Attempts:  An unsuccessful telephone outreach was attempted today to offer the patient information about available care coordination services as a benefit of their health plan.   Follow Up Plan:  Additional outreach attempts will be made to offer the patient care coordination information and services.   Encounter Outcome:  No Answer   Care Coordination Interventions:  No, not indicated    SIG Bessy Reaney C. Myrtie Neither, MSN, [facility] Gerontological Nurse Practitioner [facility] Care Management [phone]

## 2022-03-02 DIAGNOSIS — S42002D Fracture of unspecified part of left clavicle, subsequent encounter for fracture with routine healing: Secondary | ICD-10-CM | POA: Diagnosis not present

## 2022-03-03 ENCOUNTER — Ambulatory Visit (INDEPENDENT_AMBULATORY_CARE_PROVIDER_SITE_OTHER): Payer: Medicare Other | Admitting: Nurse Practitioner

## 2022-03-03 VITALS — BP 122/80 | Wt 161.4 lb

## 2022-03-03 DIAGNOSIS — E1122 Type 2 diabetes mellitus with diabetic chronic kidney disease: Secondary | ICD-10-CM | POA: Diagnosis not present

## 2022-03-03 DIAGNOSIS — E785 Hyperlipidemia, unspecified: Secondary | ICD-10-CM | POA: Diagnosis not present

## 2022-03-03 DIAGNOSIS — N1832 Chronic kidney disease, stage 3b: Secondary | ICD-10-CM | POA: Diagnosis not present

## 2022-03-03 NOTE — Progress Notes (Signed)
   Subjective:    Patient ID: Sabrina Deleon, female    DOB: 07/15/1950, 71 y.o.   MRN: [id number]  HPI Patient states she is "not quite sure why she is here".  Adherent to medication regimen.  Has started oncology therapy for a mesenteric mass/neuroendocrine cancer.  Continues to eat small amounts of food during the day.  Weight has stabilized over the past couple of months.  Limited activity due to fatigue and energy levels as well as orthopedic issues.  States her sugars at home are running much better.  Depression symptoms have been stable.  Continues follow-up with her specialist including oncology, cardiology and gastroenterology.    Review of Systems  Constitutional:  Positive for fatigue.  Respiratory:  Negative for cough, chest tightness and shortness of breath.   Cardiovascular:  Negative for chest pain and leg swelling.       Objective:   Physical Exam NAD.  Alert, oriented.  Lungs clear.  Heart regular rate rhythm.  Grade 4/6 loud holosystolic murmur heard throughout precordium loudest at second ICS-RSB.  Lower extremities no edema. Today's Vitals   03/03/22 1505  BP: 122/80  Weight: 161 lb 6.4 oz (73.2 kg)   Body mass index is 26.25 kg/m. Weight stable over the past 2 months.         Assessment & Plan:   Problem List Items Addressed This Visit       Endocrine   Type 2 diabetes mellitus ([place]) - Primary     Other   Hyperlipidemia   Continue current medications as directed.  Routine follow-up for chronic disease management.  Patient to call back if any problems.  Continue follow-up with her specialists as planned.

## 2022-03-06 ENCOUNTER — Ambulatory Visit (INDEPENDENT_AMBULATORY_CARE_PROVIDER_SITE_OTHER): Payer: Medicare Other | Admitting: Gastroenterology

## 2022-03-06 ENCOUNTER — Telehealth: Payer: Self-pay | Admitting: *Deleted

## 2022-03-06 NOTE — Telephone Encounter (Signed)
Received vm message from pt asking about her nuclear medicine dotatate scan. This scan was denied by her Medicare. Therefore she has not had it yet.  Please advise

## 2022-03-10 ENCOUNTER — Other Ambulatory Visit: Payer: Self-pay | Admitting: Hematology and Oncology

## 2022-03-10 ENCOUNTER — Inpatient Hospital Stay (HOSPITAL_BASED_OUTPATIENT_CLINIC_OR_DEPARTMENT_OTHER): Payer: Medicare Other | Admitting: Hematology and Oncology

## 2022-03-10 ENCOUNTER — Inpatient Hospital Stay: Payer: Medicare Other

## 2022-03-10 ENCOUNTER — Inpatient Hospital Stay: Payer: Medicare Other | Attending: Physician Assistant

## 2022-03-10 ENCOUNTER — Other Ambulatory Visit: Payer: Self-pay

## 2022-03-10 VITALS — BP 143/44 | HR 56 | Temp 98.2°F | Resp 17 | Wt 160.5 lb

## 2022-03-10 DIAGNOSIS — K6389 Other specified diseases of intestine: Secondary | ICD-10-CM | POA: Diagnosis not present

## 2022-03-10 DIAGNOSIS — C7A00 Malignant carcinoid tumor of unspecified site: Secondary | ICD-10-CM | POA: Diagnosis not present

## 2022-03-10 DIAGNOSIS — C7A8 Other malignant neuroendocrine tumors: Secondary | ICD-10-CM

## 2022-03-10 DIAGNOSIS — R590 Localized enlarged lymph nodes: Secondary | ICD-10-CM | POA: Insufficient documentation

## 2022-03-10 DIAGNOSIS — C7B01 Secondary carcinoid tumors of distant lymph nodes: Secondary | ICD-10-CM | POA: Insufficient documentation

## 2022-03-10 LAB — CBC WITH DIFFERENTIAL (CANCER CENTER ONLY)
Abs Immature Granulocytes: 0.01 10*3/uL (ref 0.00–0.07)
Basophils Absolute: 0 10*3/uL (ref 0.0–0.1)
Basophils Relative: 1 %
Eosinophils Absolute: 0.6 10*3/uL — ABNORMAL HIGH (ref 0.0–0.5)
Eosinophils Relative: 11 %
HCT: 32.1 % — ABNORMAL LOW (ref 36.0–46.0)
Hemoglobin: 10.3 g/dL — ABNORMAL LOW (ref 12.0–15.0)
Immature Granulocytes: 0 %
Lymphocytes Relative: 29 %
Lymphs Abs: 1.6 10*3/uL (ref 0.7–4.0)
MCH: 29.6 pg (ref 26.0–34.0)
MCHC: 32.1 g/dL (ref 30.0–36.0)
MCV: 92.2 fL (ref 80.0–100.0)
Monocytes Absolute: 0.3 10*3/uL (ref 0.1–1.0)
Monocytes Relative: 6 %
Neutro Abs: 3 10*3/uL (ref 1.7–7.7)
Neutrophils Relative %: 53 %
Platelet Count: 142 10*3/uL — ABNORMAL LOW (ref 150–400)
RBC: 3.48 MIL/uL — ABNORMAL LOW (ref 3.87–5.11)
RDW: 14.6 % (ref 11.5–15.5)
WBC Count: 5.5 10*3/uL (ref 4.0–10.5)
nRBC: 0 % (ref 0.0–0.2)

## 2022-03-10 LAB — CMP (CANCER CENTER ONLY)
ALT: 42 U/L (ref 0–44)
AST: 72 U/L — ABNORMAL HIGH (ref 15–41)
Albumin: 4.3 g/dL (ref 3.5–5.0)
Alkaline Phosphatase: 68 U/L (ref 38–126)
Anion gap: 3 — ABNORMAL LOW (ref 5–15)
BUN: 41 mg/dL — ABNORMAL HIGH (ref 8–23)
CO2: 33 mmol/L — ABNORMAL HIGH (ref 22–32)
Calcium: 10.5 mg/dL — ABNORMAL HIGH (ref 8.9–10.3)
Chloride: 103 mmol/L (ref 98–111)
Creatinine: 1.31 mg/dL — ABNORMAL HIGH (ref 0.44–1.00)
GFR, Estimated: 44 mL/min — ABNORMAL LOW (ref >60)
Glucose, Bld: 199 mg/dL — ABNORMAL HIGH (ref 70–99)
Potassium: 4 mmol/L (ref 3.5–5.1)
Sodium: 139 mmol/L (ref 135–145)
Total Bilirubin: 0.6 mg/dL (ref 0.3–1.2)
Total Protein: 7.3 g/dL (ref 6.5–8.1)

## 2022-03-10 MED ORDER — LANREOTIDE ACETATE 120 MG/0.5ML ~~LOC~~ SOLN
120.0000 mg | Freq: Once | SUBCUTANEOUS | Status: AC
  Administered 2022-03-10: 120 mg via SUBCUTANEOUS
  Filled 2022-03-10: qty 120

## 2022-03-10 NOTE — Progress Notes (Signed)
[facility] [phone]   [phone]  PROGRESS NOTE  Patient Care Team: Kathyrn Drown, MD as PCP - General (Family Medicine) Satira Sark, MD as PCP - Cardiology (Cardiology)  Hematological/Oncological History # Well Differentiated Neuroendocrine Tumor, GI origin. Grade 2.  11/29/2021: Patient underwent CT angiogram C/A/P prior to consideration of TAVR for severe aortic stenosis, noted to have bulky central mesenteric soft tissue mass that has increased in 2019.  Additionally there was new widespread mild to moderate retroperitoneal, retrocrural, mediastinal and bilateral hilar lymphadenopathy 12/15/2021: NM PET CT Scan showed hypermetabolic mesenteric mass with hypermetabolic adenopathy in the small bowel mesentery, abdominal retroperitoneum and retrocrural stations. 12/28/2021: IR guided biopsy of the mesenteric mass was consistent with well differentiated neuroendocrine tumor 01/10/2022: start of lanreotide '120mg'$  IM q 28 days.   Interval History:  Sabrina Deleon 71 y.o. female with medical history significant for newly diagnosed neuroendocrine tumor who presents for a follow up visit. The patient's last visit was on 02/10/2022. In the interim since the last visit she continued lanreotide treatment.  On exam today Sabrina Deleon reports he has been feeling well in the interim since her last visit.  She notes she is not having any side effects as a result of her shots.  She reports that her bowels have been moving regularly with no loose stools.  She reports at 1 point it was little more watery but now the stool looks like "mud" every other day.  She notes that her stomach feels better.  She notes that she is trying to eat more and gain weight but she is down approximately 3 pounds since November.  She reports that she is eating better overall.  She is not having any itching, rash, redness at the site of injection.  Overall she is willing and able to proceed  with therapy at this time.  She is not having any issues with the injection site such as rash, itching, or soreness. Full 10 point ROS was otherwise negative.   MEDICAL HISTORY:  Past Medical History:  Diagnosis Date   Anxiety    Aortic stenosis    Arthritis    Chronic kidney disease    Dr. Tami Ribas- noted increase kidney function levels- took pt off Metformin and started on Onglyza for Blood sugar control-pt being followed for this.   GERD (gastroesophageal reflux disease)    Gout    Hypertension    Hypothyroidism    Morbid obesity ([place]) 03/29/2018   Patient has elevated BMI with diabetes hyperlipidemia and hypertension   Pancreatitis    Type 2 diabetes mellitus ([place])    Wears glasses    Wears partial dentures    Upper    SURGICAL HISTORY: Past Surgical History:  Procedure Laterality Date   BACK SURGERY     Lumbar   CARPAL TUNNEL RELEASE  11/02/2011   Procedure: CARPAL TUNNEL RELEASE;  Surgeon: Tennis Must, MD;  Location: [facility];  Service: Orthopedics;  Laterality: Right;   CARPAL TUNNEL RELEASE Left 01/14/2015   Procedure: LEFT CARPAL TUNNEL RELEASE;  Surgeon: Leanora Cover, MD;  Location: [facility];  Service: Orthopedics;  Laterality: Left;   CHOLECYSTECTOMY     laparoscopic   COLONOSCOPY     COLONOSCOPY N/A 11/12/2015   Procedure: COLONOSCOPY;  Surgeon: Rogene Houston, MD;  [facility];  Service: Endoscopy;  Laterality: N/A;  9:30   ESOPHAGOGASTRODUODENOSCOPY N/A 11/12/2015   Procedure: ESOPHAGOGASTRODUODENOSCOPY (EGD);  Surgeon:  Rogene Houston, MD;  [facility];  Service: Endoscopy;  Laterality: N/A;   REVERSE SHOULDER ARTHROPLASTY Right 11/04/2018   REVERSE SHOULDER ARTHROPLASTY Right 11/04/2018   Procedure: REVERSE SHOULDER ARTHROPLASTY;  Surgeon: Verner Mould, MD;  Location: [facility];  Service: Orthopedics;  Laterality: Right;   RIGHT/LEFT HEART CATH AND CORONARY ANGIOGRAPHY N/A 10/18/2017   Procedure:  RIGHT/LEFT HEART CATH AND CORONARY ANGIOGRAPHY;  Surgeon: Burnell Blanks, MD;  Location: [facility];  Service: Cardiovascular;  Laterality: N/A;   TOTAL HIP ARTHROPLASTY Left 03/22/2016   Procedure: LEFT TOTAL HIP ARTHROPLASTY ANTERIOR APPROACH;  Surgeon: Gaynelle Arabian, MD;  Location: [facility];  Service: Orthopedics;  Laterality: Left;    SOCIAL HISTORY: Social History   Socioeconomic History   Marital status: Married    Spouse name: Not on file   Number of children: Not on file   Years of education: Not on file   Highest education level: Not on file  Occupational History   Not on file  Tobacco Use   Smoking status: Former    Types: Cigarettes    Quit date: 10/29/1981    Years since quitting: 40.3   Smokeless tobacco: Never  Vaping Use   Vaping Use: Never used  Substance and Sexual Activity   Alcohol use: Yes    Comment: occasionally   Drug use: No   Sexual activity: Not Currently  Other Topics Concern   Not on file  Social History Narrative   Not on file   Social Determinants of Health   Financial Resource Strain: Not on file  Food Insecurity: Not on file  Transportation Needs: Not on file  Physical Activity: Not on file  Stress: Not on file  Social Connections: Not on file  Intimate Partner Violence: Not on file    FAMILY HISTORY: Family History  Problem Relation Age of Onset   Breast cancer Mother    Lung cancer Mother        likely recurrent breast cancer   Breast cancer Paternal Grandmother    Head & neck cancer Nephew     ALLERGIES:  is allergic to bee venom.  MEDICATIONS:  Current Outpatient Medications  Medication Sig Dispense Refill   allopurinol (ZYLOPRIM) 100 MG tablet TAKE 1 TABLET(100 MG) BY MOUTH TWICE DAILY 180 tablet 1   aspirin EC 81 MG tablet Take 81 mg by mouth at bedtime.     calcium carbonate (TUMS - DOSED IN MG ELEMENTAL CALCIUM) 500 MG chewable tablet Chew 1-2 tablets by mouth daily as needed for indigestion or  heartburn.     Cholecalciferol (VITAMIN D) 50 MCG (2000 UT) tablet Take 2,000 Units by mouth daily.     citalopram (CELEXA) 20 MG tablet TAKE 1 AND 1/2 TABLETS BY MOUTH EVERY DAY 135 tablet 1   dicyclomine (BENTYL) 10 MG capsule Take 1 capsule (10 mg total) by mouth 3 (three) times daily before meals. 90 capsule 0   ferrous sulfate 325 (65 FE) MG tablet Take 325 mg by mouth daily with breakfast.     furosemide (LASIX) 40 MG tablet TAKE 1 TABLET(40 MG) BY MOUTH DAILY 90 tablet 1   HYDROcodone-acetaminophen (NORCO/VICODIN) 5-325 MG tablet Take one tab po BID prn pain 45 tablet 0   ketoconazole (NIZORAL) 2 % cream Apply 1 Application topically daily as needed (fungus). 15 g 0   levothyroxine (SYNTHROID) 125 MCG tablet Take 1 tablet (125 mcg total) by mouth daily before breakfast. 90 tablet 1  lisinopril (ZESTRIL) 20 MG tablet TAKE 1 TABLET(20 MG) BY MOUTH DAILY 90 tablet 1   MISC NATURAL PRODUCTS PO Take 1 tablet by mouth daily. Super Beets otc supplement     ondansetron (ZOFRAN) 8 MG tablet Take 1 tablet (8 mg total) by mouth every 8 (eight) hours as needed. 30 tablet 0   ONETOUCH ULTRA test strip TEST ONCE DAILY 50 strip 5   pantoprazole (PROTONIX) 40 MG tablet TAKE 1 TABLET(40 MG) BY MOUTH DAILY (Patient taking differently: Take 40 mg by mouth daily.) 90 tablet 1   potassium chloride (KLOR-CON M) 10 MEQ tablet TAKE 1 TABLET BY MOUTH EVERY DAY ON MONDAY, WEDNESDAY AND FRIDAY 14 tablet 6   rosuvastatin (CRESTOR) 10 MG tablet TAKE 1 TABLET(10 MG) BY MOUTH DAILY FOR CHOLESTEROL. STOP WHILE TAKING COLCHICINE FOR GOUT 90 tablet 1   zolpidem (AMBIEN) 5 MG tablet Take 1 tablet (5 mg total) by mouth at bedtime. 30 tablet 2   No current facility-administered medications for this visit.    REVIEW OF SYSTEMS:   Constitutional: ( - ) fevers, ( - )  chills , ( - ) night sweats Eyes: ( - ) blurriness of vision, ( - ) double vision, ( - ) watery eyes Ears, nose, mouth, throat, and face: ( - ) mucositis, ( -  ) sore throat Respiratory: ( - ) cough, ( - ) dyspnea, ( - ) wheezes Cardiovascular: ( - ) palpitation, ( - ) chest discomfort, ( - ) lower extremity swelling Gastrointestinal:  ( - ) nausea, ( - ) heartburn, ( - ) change in bowel habits Skin: ( - ) abnormal skin rashes Lymphatics: ( - ) new lymphadenopathy, ( - ) easy bruising Neurological: ( - ) numbness, ( - ) tingling, ( - ) new weaknesses Behavioral/Psych: ( - ) mood change, ( - ) new changes  All other systems were reviewed with the patient and are negative.  PHYSICAL EXAMINATION: ECOG PERFORMANCE STATUS: 1 - Symptomatic but completely ambulatory  Vitals:   03/10/22 1143  BP: (!) 143/44  Pulse: (!) 56  Resp: 17  Temp: 98.2 F (36.8 C)  SpO2: 100%   Filed Weights   03/10/22 1143  Weight: 160 lb 8 oz (72.8 kg)    GENERAL: Well-appearing elderly Caucasian female, alert, no distress and comfortable SKIN: skin color, texture, turgor are normal, no rashes or significant lesions EYES: conjunctiva are pink and non-injected, sclera clear LUNGS: clear to auscultation and percussion with normal breathing effort HEART: regular rate & rhythm and no murmurs and no lower extremity edema Musculoskeletal: no cyanosis of digits and no clubbing  PSYCH: alert & oriented x 3, fluent speech NEURO: no focal motor/sensory deficits  LABORATORY DATA:  I have reviewed the data as listed    Latest Ref Rng & Units 03/10/2022   10:55 AM 02/10/2022    9:41 AM 01/10/2022    9:29 AM  CBC  WBC 4.0 - 10.5 K/uL 5.5  5.7  6.6   Hemoglobin 12.0 - 15.0 g/dL 10.3  9.2  10.2   Hematocrit 36.0 - 46.0 % 32.1  28.6  31.4   Platelets 150 - 400 K/uL 142  195  202        Latest Ref Rng & Units 03/10/2022   10:55 AM 02/10/2022    9:41 AM 01/10/2022    9:29 AM  CMP  Glucose 70 - 99 mg/dL 199  149  101   BUN 8 - 23 mg/dL 41  30  23   Creatinine 0.44 - 1.00 mg/dL 1.31  1.55  1.27   Sodium 135 - 145 mmol/L 139  138  140   Potassium 3.5 - 5.1 mmol/L 4.0   4.3  4.3   Chloride 98 - 111 mmol/L 103  103  107   CO2 22 - 32 mmol/L 33  32  28   Calcium 8.9 - 10.3 mg/dL 10.5  10.7  10.6   Total Protein 6.5 - 8.1 g/dL 7.3  7.2  7.4   Total Bilirubin 0.3 - 1.2 mg/dL 0.6  0.7  0.6   Alkaline Phos 38 - 126 U/L 68  76  58   AST 15 - 41 U/L 72  50  33   ALT 0 - 44 U/L 42  25  16    RADIOGRAPHIC STUDIES: No results found.  ASSESSMENT & PLAN Sabrina Deleon 71 y.o. female with medical history significant for newly diagnosed neuroendocrine tumor who presents for a follow up visit.  #Metastatic Well Differentiated Neuroendocrine Tumor of the GI Tract -- CT imaging and PET CT scan confirmed widespread lymphadenopathy consistent with metastatic spread of well-differentiated neuroendocrine tumor. -- Biopsy confirms this is a well differentiated neuroendocrine tumor of the GI tract. -- Today we will proceed with lanreotide 120 mg IM q. 28 days. --  We will plan for imaging every 3 months for starters and is stable every 6 months.  We are still in need of her baseline dotatate scan. --labs today show white blood cell 5.5, hemoglobin 10.3, MCV 92.2, and platelets of 142 -- Have patient return to clinic in 12 weeks time with interval monthly shots.   No orders of the defined types were placed in this encounter.   All questions were answered. The patient knows to call the clinic with any problems, questions or concerns.  A total of more than 30 minutes were spent on this encounter with face-to-face time and non-face-to-face time, including preparing to see the patient, ordering tests and/or medications, counseling the patient and coordination of care as outlined above.   Ledell Peoples, MD Department of Hematology/Oncology [place] [facility] Phone: [phone] Pager: [phone] Email: [email or web address]  03/13/2022 6:11 PM

## 2022-03-13 ENCOUNTER — Encounter: Payer: Self-pay | Admitting: Hematology and Oncology

## 2022-03-15 ENCOUNTER — Telehealth: Payer: Self-pay

## 2022-03-15 NOTE — Telephone Encounter (Signed)
The patient called to report she will not be proceeding with any valve intervention at this time.  Reiterated to her to call if CP, SOB, dizziness, fainting occur. She was grateful for assistance.

## 2022-03-21 ENCOUNTER — Telehealth: Payer: Self-pay | Admitting: *Deleted

## 2022-03-21 NOTE — Patient Outreach (Signed)
  Care Coordination   03/21/2022 Name: Sabrina Deleon MRN: [id number] DOB: 1950/12/03   Care Coordination Outreach Attempts:  An unsuccessful telephone outreach was attempted today to offer the patient information about available care coordination services as a benefit of their health plan.   Follow Up Plan:  Additional outreach attempts will be made to offer the patient care coordination information and services.   Encounter Outcome:  No Answer   Care Coordination Interventions:  No, not indicated    SIG Sahvannah Rieser C. Myrtie Neither, MSN, [facility] Gerontological Nurse Practitioner [facility] Care Management [phone]

## 2022-04-07 ENCOUNTER — Inpatient Hospital Stay: Payer: Medicare Other

## 2022-04-07 ENCOUNTER — Other Ambulatory Visit: Payer: Self-pay | Admitting: Hematology and Oncology

## 2022-04-07 ENCOUNTER — Inpatient Hospital Stay: Payer: Medicare Other | Admitting: Hematology and Oncology

## 2022-04-07 DIAGNOSIS — C7A8 Other malignant neuroendocrine tumors: Secondary | ICD-10-CM

## 2022-04-09 ENCOUNTER — Encounter: Payer: Medicare Other | Admitting: Nurse Practitioner

## 2022-04-09 DIAGNOSIS — R6889 Other general symptoms and signs: Secondary | ICD-10-CM

## 2022-04-10 ENCOUNTER — Telehealth: Payer: Self-pay

## 2022-04-10 NOTE — Telephone Encounter (Signed)
Patient called to report having 3 episodes of nose bleeds with some clots noted.   Per Dr. Lorenso Courier, Patient offered a lab appointment at [facility] and instructed to call PCP for evaluation. Patient declined lab appointment at [facility].  Instructed patient to seek immediate medical attention if bleeding becomes concerning prior to evaluation with PCP.

## 2022-04-10 NOTE — Progress Notes (Signed)
No response

## 2022-04-11 ENCOUNTER — Ambulatory Visit: Payer: Medicare Other | Admitting: Nurse Practitioner

## 2022-04-17 ENCOUNTER — Other Ambulatory Visit: Payer: Self-pay | Admitting: Nurse Practitioner

## 2022-04-17 MED ORDER — AMOXICILLIN-POT CLAVULANATE 875-125 MG PO TABS: 1.0000 | ORAL_TABLET | Freq: Two times a day (BID) | ORAL | 0 refills | Status: DC

## 2022-04-17 NOTE — Progress Notes (Signed)
Phone call from patient. Had a recent e visit for sinusitis. Symptoms have been persistent for over 2 weeks. States her oncologist recommends starting an antibiotic because of her upcoming treatment. Will be sent in. Office visit if worsens or persists.

## 2022-04-19 DIAGNOSIS — S42002D Fracture of unspecified part of left clavicle, subsequent encounter for fracture with routine healing: Secondary | ICD-10-CM | POA: Diagnosis not present

## 2022-04-21 ENCOUNTER — Inpatient Hospital Stay: Payer: Medicare Other | Admitting: Physician Assistant

## 2022-04-21 ENCOUNTER — Inpatient Hospital Stay: Payer: Medicare Other

## 2022-04-21 ENCOUNTER — Inpatient Hospital Stay: Payer: Medicare Other | Attending: Physician Assistant

## 2022-04-21 ENCOUNTER — Other Ambulatory Visit: Payer: Self-pay

## 2022-04-21 VITALS — BP 139/66 | HR 60 | Temp 98.1°F | Resp 16

## 2022-04-21 DIAGNOSIS — C7B01 Secondary carcinoid tumors of distant lymph nodes: Secondary | ICD-10-CM | POA: Diagnosis not present

## 2022-04-21 DIAGNOSIS — C7A8 Other malignant neuroendocrine tumors: Secondary | ICD-10-CM

## 2022-04-21 DIAGNOSIS — C7A00 Malignant carcinoid tumor of unspecified site: Secondary | ICD-10-CM | POA: Insufficient documentation

## 2022-04-21 LAB — CMP (CANCER CENTER ONLY)
ALT: 52 U/L — ABNORMAL HIGH (ref 0–44)
AST: 81 U/L — ABNORMAL HIGH (ref 15–41)
Albumin: 3.8 g/dL (ref 3.5–5.0)
Alkaline Phosphatase: 68 U/L (ref 38–126)
Anion gap: 5 (ref 5–15)
BUN: 39 mg/dL — ABNORMAL HIGH (ref 8–23)
CO2: 28 mmol/L (ref 22–32)
Calcium: 10.5 mg/dL — ABNORMAL HIGH (ref 8.9–10.3)
Chloride: 107 mmol/L (ref 98–111)
Creatinine: 1.29 mg/dL — ABNORMAL HIGH (ref 0.44–1.00)
GFR, Estimated: 45 mL/min — ABNORMAL LOW (ref >60)
Glucose, Bld: 181 mg/dL — ABNORMAL HIGH (ref 70–99)
Potassium: 4.4 mmol/L (ref 3.5–5.1)
Sodium: 140 mmol/L (ref 135–145)
Total Bilirubin: 0.4 mg/dL (ref 0.3–1.2)
Total Protein: 6.8 g/dL (ref 6.5–8.1)

## 2022-04-21 LAB — CBC WITH DIFFERENTIAL (CANCER CENTER ONLY)
Abs Immature Granulocytes: 0.02 10*3/uL (ref 0.00–0.07)
Basophils Absolute: 0 10*3/uL (ref 0.0–0.1)
Basophils Relative: 1 %
Eosinophils Absolute: 0.3 10*3/uL (ref 0.0–0.5)
Eosinophils Relative: 6 %
HCT: 29.9 % — ABNORMAL LOW (ref 36.0–46.0)
Hemoglobin: 9.7 g/dL — ABNORMAL LOW (ref 12.0–15.0)
Immature Granulocytes: 0 %
Lymphocytes Relative: 20 %
Lymphs Abs: 1.2 10*3/uL (ref 0.7–4.0)
MCH: 29.8 pg (ref 26.0–34.0)
MCHC: 32.4 g/dL (ref 30.0–36.0)
MCV: 92 fL (ref 80.0–100.0)
Monocytes Absolute: 0.5 10*3/uL (ref 0.1–1.0)
Monocytes Relative: 8 %
Neutro Abs: 4.1 10*3/uL (ref 1.7–7.7)
Neutrophils Relative %: 65 %
Platelet Count: 174 10*3/uL (ref 150–400)
RBC: 3.25 MIL/uL — ABNORMAL LOW (ref 3.87–5.11)
RDW: 14.3 % (ref 11.5–15.5)
WBC Count: 6.1 10*3/uL (ref 4.0–10.5)
nRBC: 0 % (ref 0.0–0.2)

## 2022-04-21 MED ORDER — LANREOTIDE ACETATE 120 MG/0.5ML ~~LOC~~ SOLN
120.0000 mg | Freq: Once | SUBCUTANEOUS | Status: AC
  Administered 2022-04-21: 120 mg via SUBCUTANEOUS
  Filled 2022-04-21: qty 120

## 2022-04-21 NOTE — Patient Instructions (Signed)
Lanreotide Injection What is this medication? LANREOTIDE (lan REE oh tide) treats high levels of growth hormone (acromegaly). It is used when other therapies have not worked well enough or cannot be tolerated. It works by reducing the amount of growth hormone your body makes. This reduces symptoms and the risk of health problems caused by too much growth hormone, such as diabetes and heart disease. It may also be used to treat neuroendocrine tumors, a cancer of the cells that release hormones and other substances in your body. It works by slowing down the release of these substances from the cells. This slows tumor growth. It also decreases the symptoms of carcinoid syndrome, such as flushing or diarrhea. This medicine may be used for other purposes; ask your health care provider or pharmacist if you have questions. COMMON BRAND NAME(S): Somatuline [place] What should I tell my care team before I take this medication? They need to know if you have any of these conditions: Diabetes Gallbladder disease Heart disease Kidney disease Liver disease Thyroid disease An unusual or allergic reaction to lanreotide, other medications, foods, dyes, or preservatives Pregnant or trying to get pregnant Breast-feeding How should I use this medication? This medication is injected under the skin. It is given by your care team in a hospital or clinic setting. Talk to your care team about the use of this medication in children. Special care may be needed. Overdosage: If you think you have taken too much of this medicine contact a poison control center or emergency room at once. NOTE: This medicine is only for you. Do not share this medicine with others. What if I miss a dose? Keep appointments for follow-up doses. It is important not to miss your dose. Call your care team if you are unable to keep an appointment. What may interact with this medication? Bromocriptine Cyclosporine Certain medications for blood  pressure, heart disease, irregular heartbeat Certain medications for diabetes Quinidine Terfenadine This list may not describe all possible interactions. Give your health care provider a list of all the medicines, herbs, non-prescription drugs, or dietary supplements you use. Also tell them if you smoke, drink alcohol, or use illegal drugs. Some items may interact with your medicine. What should I watch for while using this medication? Visit your care team for regular checks on your progress. Tell your care team if your symptoms do not start to get better or if they get worse. Your condition will be monitored carefully while you are receiving this medication. You may need blood work while you are taking this medication. This medication may increase blood sugar. The risk may be higher in patients who already have diabetes. Ask your care team what you can do to lower your risk of diabetes while taking this medication. Talk to your care team if you wish to become pregnant or think you may be pregnant. This medication can cause serious birth defects. Do not breast-feed while taking this medication and for 6 months after stopping therapy. This medication may cause infertility. Talk to your care team if you are concerned about your fertility. What side effects may I notice from receiving this medication? Side effects that you should report to your care team as soon as possible: Allergic reactions--skin rash, itching, hives, swelling of the face, lips, tongue, or throat Gallbladder problems--severe stomach pain, nausea, vomiting, fever High blood sugar (hyperglycemia)--increased thirst or amount of urine, unusual weakness or fatigue, blurry vision Increase in blood pressure Low blood sugar (hypoglycemia)--tremors or shaking, anxiety, sweating, cold   or clammy skin, confusion, dizziness, rapid heartbeat Low thyroid levels (hypothyroidism)--unusual weakness or fatigue, increased sensitivity to cold,  constipation, hair loss, dry skin, weight gain, feelings of depression Slow heartbeat--dizziness, feeling faint or lightheaded, confusion, trouble breathing, unusual weakness or fatigue Side effects that usually do not require medical attention (report to your care team if they continue or are bothersome): Diarrhea Dizziness Headache Muscle spasms Nausea Pain, redness, irritation, or bruising at the injection site Stomach pain This list may not describe all possible side effects. Call your doctor for medical advice about side effects. You may report side effects to FDA at 1-800-FDA-1088. Where should I keep my medication? This medication is given in a hospital or clinic. It will not be stored at home. NOTE: This sheet is a summary. It may not cover all possible information. If you have questions about this medicine, talk to your doctor, pharmacist, or health care provider.  2023 Elsevier/Gold Standard (2021-05-06 00:00:00)  

## 2022-04-24 LAB — CHROMOGRANIN A: Chromogranin A (ng/mL): 102.4 ng/mL — ABNORMAL HIGH (ref 0.0–101.8)

## 2022-04-25 ENCOUNTER — Encounter: Payer: Self-pay | Admitting: Nurse Practitioner

## 2022-04-25 ENCOUNTER — Ambulatory Visit: Payer: Medicare Other | Attending: Nurse Practitioner | Admitting: Nurse Practitioner

## 2022-04-25 VITALS — BP 135/78 | HR 52 | Ht 68.5 in | Wt 164.2 lb

## 2022-04-25 DIAGNOSIS — I10 Essential (primary) hypertension: Secondary | ICD-10-CM | POA: Diagnosis not present

## 2022-04-25 DIAGNOSIS — I350 Nonrheumatic aortic (valve) stenosis: Secondary | ICD-10-CM

## 2022-04-25 DIAGNOSIS — I6523 Occlusion and stenosis of bilateral carotid arteries: Secondary | ICD-10-CM | POA: Diagnosis not present

## 2022-04-25 DIAGNOSIS — I059 Rheumatic mitral valve disease, unspecified: Secondary | ICD-10-CM

## 2022-04-25 DIAGNOSIS — R9389 Abnormal findings on diagnostic imaging of other specified body structures: Secondary | ICD-10-CM

## 2022-04-25 DIAGNOSIS — I38 Endocarditis, valve unspecified: Secondary | ICD-10-CM

## 2022-04-25 DIAGNOSIS — C7A8 Other malignant neuroendocrine tumors: Secondary | ICD-10-CM | POA: Diagnosis not present

## 2022-04-25 DIAGNOSIS — I2510 Atherosclerotic heart disease of native coronary artery without angina pectoris: Secondary | ICD-10-CM

## 2022-04-25 NOTE — Progress Notes (Unsigned)
Cardiology Office Note:    Date:  04/25/2022  ID:  Sabrina, Deleon 31-Oct-1950, MRN [id number]  PCP:  Kathyrn Drown, MD   [facility] Providers Cardiologist:  Rozann Lesches, MD     Referring MD: Kathyrn Drown, MD   CC: Here for follow-up  History of Present Illness:    Sabrina Deleon is a 72 y.o. female with a hx of the following:  Mild, nonobstructive CAD Severe aortic stenosis Mitral valve disease Hypertension Hypothyroidism Type 2 diabetes Chronic kidney disease Carotid artery disease Obesity Neuroendocrine tumor (Dx 2023 - seeing Oncology)  Patient is a delightful 72 year old female with past medical history as mentioned above.  Last seen by Dr. Domenic Polite on November 07, 2021.  She was doing well at the time.  Previous echocardiogram revealed normal EF, moderate diastolic dysfunction, moderate MR and moderate mitral stenosis with mean gradient at 8 mmHg, severe aortic stenosis with mean gradient increased to 57 mmHg.  Patient verbalized hesitancy to pursue surgery.  However she was agreeable to consider further discussion with Dr. Cyndia Bent.  She underwent CT angiogram C/A/P prior to consideration for TAVR for severe aortic aortic stenosis, noted to have bulky central mesenteric soft tissue mass, increased in size since 2019.  New and widespread mild to moderate retroperitoneal, mediastinal, retrocrural, and bilateral hilar lymphadenopathy also noted.  Diagnosed with differentiated neuroendocrine tumor, seeing Dr. Lorenso Courier.   Patient canceled appointment with Dr. Cyndia Bent in October 2023.  Denied any acute cardiac concerns or issues.  Patient requested to wait until first of the year to start thinking about potential aortic valve replacement.   Today she presents for follow-up.  She states she is doing well from a cardiac perspective. She is getting injections for the neuroendocrine tumor, overall tolerating well. Denies any chest pain, shortness of breath,  palpitations, syncope, presyncope, dizziness, orthopnea, PND, swelling or significant weight changes, acute bleeding, or claudication. Currently declines referral to see Dr. Cyndia Bent.   SH: In her free time, she enjoys spending time with her grandchildren. She plays the piano and sings in her church's choir.   Past Medical History:  Diagnosis Date   Anxiety    Aortic stenosis    Arthritis    Chronic kidney disease    Dr. Tami Ribas- noted increase kidney function levels- took pt off Metformin and started on Onglyza for Blood sugar control-pt being followed for this.   GERD (gastroesophageal reflux disease)    Gout    Hypertension    Hypothyroidism    Morbid obesity ([place]) 03/29/2018   Patient has elevated BMI with diabetes hyperlipidemia and hypertension   Pancreatitis    Type 2 diabetes mellitus ([place])    Wears glasses    Wears partial dentures    Upper    Past Surgical History:  Procedure Laterality Date   BACK SURGERY     Lumbar   CARPAL TUNNEL RELEASE  11/02/2011   Procedure: CARPAL TUNNEL RELEASE;  Surgeon: Tennis Must, MD;  Location: [place];  Service: Orthopedics;  Laterality: Right;   CARPAL TUNNEL RELEASE Left 01/14/2015   Procedure: LEFT CARPAL TUNNEL RELEASE;  Surgeon: Leanora Cover, MD;  Location: [place];  Service: Orthopedics;  Laterality: Left;   CHOLECYSTECTOMY     laparoscopic   COLONOSCOPY     COLONOSCOPY N/A 11/12/2015   Procedure: COLONOSCOPY;  Surgeon: Rogene Houston, MD;  [facility];  Service: Endoscopy;  Laterality: N/A;  9:30   ESOPHAGOGASTRODUODENOSCOPY  N/A 11/12/2015   Procedure: ESOPHAGOGASTRODUODENOSCOPY (EGD);  Surgeon: Rogene Houston, MD;  [facility];  Service: Endoscopy;  Laterality: N/A;   REVERSE SHOULDER ARTHROPLASTY Right 11/04/2018   REVERSE SHOULDER ARTHROPLASTY Right 11/04/2018   Procedure: REVERSE SHOULDER ARTHROPLASTY;  Surgeon: Verner Mould, MD;  Location: [place];   Service: Orthopedics;  Laterality: Right;   RIGHT/LEFT HEART CATH AND CORONARY ANGIOGRAPHY N/A 10/18/2017   Procedure: RIGHT/LEFT HEART CATH AND CORONARY ANGIOGRAPHY;  Surgeon: Burnell Blanks, MD;  Location: [place];  Service: Cardiovascular;  Laterality: N/A;   TOTAL HIP ARTHROPLASTY Left 03/22/2016   Procedure: LEFT TOTAL HIP ARTHROPLASTY ANTERIOR APPROACH;  Surgeon: Gaynelle Arabian, MD;  Location: [facility];  Service: Orthopedics;  Laterality: Left;    Current Medications: Current Meds  Medication Sig   allopurinol (ZYLOPRIM) 100 MG tablet TAKE 1 TABLET(100 MG) BY MOUTH TWICE DAILY   aspirin EC 81 MG tablet Take 81 mg by mouth at bedtime.   calcium carbonate (TUMS - DOSED IN MG ELEMENTAL CALCIUM) 500 MG chewable tablet Chew 1-2 tablets by mouth daily as needed for indigestion or heartburn.   Cholecalciferol (VITAMIN D) 50 MCG (2000 UT) tablet Take 2,000 Units by mouth daily.   citalopram (CELEXA) 20 MG tablet TAKE 1 AND 1/2 TABLETS BY MOUTH EVERY DAY   dicyclomine (BENTYL) 10 MG capsule Take 1 capsule (10 mg total) by mouth 3 (three) times daily before meals.   ferrous sulfate 325 (65 FE) MG tablet Take 325 mg by mouth daily with breakfast.   furosemide (LASIX) 40 MG tablet TAKE 1 TABLET(40 MG) BY MOUTH DAILY   HYDROcodone-acetaminophen (NORCO/VICODIN) 5-325 MG tablet Take one tab po BID prn pain   ketoconazole (NIZORAL) 2 % cream Apply 1 Application topically daily as needed (fungus).   levothyroxine (SYNTHROID) 125 MCG tablet Take 1 tablet (125 mcg total) by mouth daily before breakfast.   lisinopril (ZESTRIL) 20 MG tablet Take 10 mg by mouth daily.   MISC NATURAL PRODUCTS PO Take 1 tablet by mouth daily. Super Beets otc supplement   ondansetron (ZOFRAN) 8 MG tablet Take 1 tablet (8 mg total) by mouth every 8 (eight) hours as needed.   ONETOUCH ULTRA test strip TEST ONCE DAILY   pantoprazole (PROTONIX) 40 MG tablet TAKE 1 TABLET(40 MG) BY MOUTH DAILY (Patient taking  differently: Take 40 mg by mouth daily.)   potassium chloride (KLOR-CON M) 10 MEQ tablet TAKE 1 TABLET BY MOUTH EVERY DAY ON MONDAY, WEDNESDAY AND FRIDAY   rosuvastatin (CRESTOR) 10 MG tablet TAKE 1 TABLET(10 MG) BY MOUTH DAILY FOR CHOLESTEROL. STOP WHILE TAKING COLCHICINE FOR GOUT   zolpidem (AMBIEN) 5 MG tablet Take 1 tablet (5 mg total) by mouth at bedtime.     Allergies:   Bee venom   Social History   Socioeconomic History   Marital status: Married    Spouse name: Not on file   Number of children: Not on file   Years of education: Not on file   Highest education level: Not on file  Occupational History   Not on file  Tobacco Use   Smoking status: Former    Types: Cigarettes    Quit date: 10/29/1981    Years since quitting: 40.5   Smokeless tobacco: Never  Vaping Use   Vaping Use: Never used  Substance and Sexual Activity   Alcohol use: Yes    Comment: occasionally   Drug use: No   Sexual activity: Not Currently  Other  Topics Concern   Not on file  Social History Narrative   Not on file   Social Determinants of Health   Financial Resource Strain: Not on file  Food Insecurity: Not on file  Transportation Needs: Not on file  Physical Activity: Not on file  Stress: Not on file  Social Connections: Not on file     Family History: The patient's family history includes Breast cancer in her mother and paternal grandmother; Head & neck cancer in her nephew; Lung cancer in her mother.  ROS:   Review of Systems  Constitutional: Negative.   HENT: Negative.    Eyes: Negative.   Respiratory: Negative.    Cardiovascular: Negative.   Gastrointestinal: Negative.   Genitourinary: Negative.   Musculoskeletal: Negative.   Skin: Negative.   Neurological: Negative.   Endo/Heme/Allergies: Negative.   Psychiatric/Behavioral: Negative.      Please see the history of present illness.    All other systems reviewed and are negative.  EKGs/Labs/Other Studies Reviewed:     The following studies were reviewed today:   EKG:  EKG is not ordered today.    Cardiac TAVR CT on 11/29/2021: IMPRESSION: 1. Tricuspid aortic valve with severely reduced cusp excursion. Severely thickened and severely calcified aortic valve cusps.   2.  Aortic valve calcium score: 3761   3. Annulus area: 540 mm2, suitable for 29 mm Sapien 3 valve. LVOT calcifications below commissure of non- and left coronary cusps. Recommend Heart Team discussion for valve sizing.   4.  Sufficient coronary artery heights from annulus.   5.  Optimum fluoroscopic angle for delivery: LAO 8 CAU 7    IMPRESSION: 1. Bulky central mesenteric soft tissue mass, increased since 2019 CT. New widespread mild to moderate retroperitoneal, retrocrural, mediastinal and bilateral hilar lymphadenopathy. Findings are most suggestive of lymphoma, with metastatic disease from an unknown primary not excluded. Oncology consultation and PET-CT suggested at this time. 2. Vascular findings and measurements pertinent to potential TAVR procedure, as detailed. 3. Diffusely thickened and coarsely calcified aortic valve, compatible with the reported history of severe aortic stenosis. 4. Dilated main pulmonary artery, suggesting chronic pulmonary arterial hypertension. 5. Scattered tiny solid right pulmonary nodules, largest 0.3 cm, not definitely seen on prior chest CT. Suggest attention on follow-up noncontrast chest CT in 3-6 months. 6. Indeterminate 1.5 cm hyperdense lateral upper left renal cortical lesion, mildly increased in size since 02/01/2018 CT. MRI (preferred) or CT abdomen without and with IV contrast is indicated for further characterization to exclude renal neoplasm. 7. Aortic Atherosclerosis (ICD10-I70.0).   Echocardiogram on 11/04/2021:  1. Left ventricular ejection fraction, by estimation, is 60 to 65%. The  left ventricle has normal function. The left ventricle has no regional  wall motion  abnormalities. There is mild left ventricular hypertrophy.  Left ventricular diastolic parameters  are consistent with Grade II diastolic dysfunction (pseudonormalization).  Elevated left atrial pressure. The average left ventricular global  longitudinal strain is -19.6 %. The global longitudinal strain is normal.   2. Right ventricular systolic function is normal. The right ventricular  size is normal. Tricuspid regurgitation signal is inadequate for assessing  PA pressure.   3. Left atrial size was moderately dilated.   4. The mitral valve is abnormal. Moderate mitral valve regurgitation.  Moderate mitral stenosis. MV peak gradient, 20.8 mmHg. The mean mitral  valve gradient is 8.0 mmHg.      Moderate mitral annular calcification.   5. The aortic valve has an indeterminant number of cusps.  There is severe  calcifcation of the aortic valve. There is severe thickening of the aortic  valve. Aortic valve regurgitation is moderate. Severe aortic valve  stenosis. . Severe aortic stenosis is   present. Aortic valve mean gradient measures 57.0 mmHg. Aortic valve peak  gradient measures 90.5 mmHg. Aortic valve area, by VTI measures 0.71 cm.   6. The inferior vena cava is normal in size with greater than 50%  respiratory variability, suggesting right atrial pressure of 3 mmHg.   Comparison(s): Echocardiogram done 05/10/21 showed an EF of 55-60% with  severe AS and an AV Mean Grad of 44 mmHg.  Carotid doppler on 02/01/2018: Right Carotid: Velocities in the right ICA are consistent with a 40-59%                 stenosis. A narrowing segment at proximal to mid, velocity                 elevated.   Left Carotid: Velocities in the left ICA are consistent with a 1-39%  stenosis.   Vertebrals: Bilateral vertebral arteries demonstrate antegrade flow.   Right and left heart cath on 10/18/2017: Prox LAD lesion is 20% stenosed. Prox RCA to Mid RCA lesion is 20% stenosed. Hemodynamic findings  consistent with mild pulmonary hypertension.   1. Mild non-obstructive CAD 2. Severe aortic stenosis with mean gradient of 26 mmHg, peak gradient 34 mmHg. (By echo the mean gradient is 19mHg and the leaflets are thickened with poor mobility).    Recommendations: She will need AVR. Her risk for surgical AVR seems to be low. I will make a referral to see Dr. BCyndia Bentor Dr. [facility]oxy [facility]sin [facility] to evaluate for surgical AVR. We will discuss her case at our valve team meeting as there is a possibility of TAVR. Findings relayed to Dr. MDomenic Polite   Recent Labs: 05/30/2021: TSH 0.652 04/21/2022: ALT 52; BUN 39; Creatinine 1.29; Hemoglobin 9.7; Platelet Count 174; Potassium 4.4; Sodium 140  Recent Lipid Panel    Component Value Date/Time   CHOL 111 05/30/2021 1015   TRIG 72 05/30/2021 1015   HDL 44 05/30/2021 1015   CHOLHDL 2.5 05/30/2021 1015   LDLCALC 52 05/30/2021 1015        Physical Exam:    VS:  BP 135/78 (BP Location: Right Arm, Patient Position: Sitting, Cuff Size: Normal)   Pulse (!) 52   Ht 5' 8.5" (1.74 m)   Wt 164 lb 3.2 oz (74.5 kg)   SpO2 100%   BMI 24.60 kg/m     Wt Readings from Last 3 Encounters:  04/25/22 164 lb 3.2 oz (74.5 kg)  03/10/22 160 lb 8 oz (72.8 kg)  03/03/22 161 lb 6.4 oz (73.2 kg)     GEN: Well nourished, well developed in no acute distress HEENT: Normal NECK: No JVD; No carotid bruits CARDIAC: S1/S2, RRR, Grade 3/6 murmur, no rubs, no gallops; 2+ pulses RESPIRATORY:  Clear to auscultation without rales, wheezing or rhonchi  MUSCULOSKELETAL:  No edema; No deformity  SKIN: Warm and dry NEUROLOGIC:  Alert and oriented x 3 PSYCHIATRIC:  Normal affect   ASSESSMENT:    1. Severe aortic stenosis   2. Mitral valve disease   3. Valvular insufficiency   4. Coronary artery disease involving native heart without angina pectoris, unspecified vessel or lesion type   5. Bilateral carotid artery stenosis   6. Essential hypertension, benign   7.  Neuroendocrine cancer ([facility]   8. Abnormal  CT scan    PLAN:    In order of problems listed above:  Severe aortic stenosis, mitral valve disease, valvular insufficiency TTE 10/2021 revealed function, preserved EF, grade 2 DD, moderate MR with moderate mitral stenosis with mean mitral valve gradient 8.0 mmHg.  Severe aortic stenosis with moderate aortic valve regurgitation, mean gradient measured 57.0 mmHg with peak gradient measuring 90.5 mmHg. Previous AV mean gradient 04/2021 was 44 mmHG. Cardiac TAVR CT 11/2021 revelaed severely thickened and severely calcified aortic valve, aortic valve calcium score 3761.  It was recommended for heart team discussion for valve sizing. Currently declines referral to structural heart. Discussed risks, and she verbalized understanding. Currently asymptomatic. ED precautions discussed. Continue current medication regimen. Heart healthy diet and regular cardiovascular exercise encouraged.   Mild, nonobstructive CAD, Carotid artery stenosis Stable with no anginal symptoms. No indication for ischemic evaluation.  Continue aspirin, lisinopril, and rosuvastatin. Heart healthy diet encouraged. Carotid doppler in 2019 showed R ICA at 40-59% stenosis, L ICA stenosis 1-39%. Continue rosuvastatin.   HTN BP on arrival 146/78, repeat BP 135/78. BP well controlled at home.  Continue current medication regimen. Currntly not on any beta blockers or CCB. Currently on low dose Lisinopril. Hesitant to uptitrate Lisinopril and further reduce afterload with history of severe aortic stenosis. Will route note to Dr. Domenic Polite for input. No medication changes at this time. Heart healthy diet recommended.   Neuroendocrine tumor, abnormal CT scan  Receiving treatment. Continue to follow-up with oncology. CT scan 11/2021 showed right pulmonary nodules, largest 0.3 cm. Was suggested attention follow-up with noncontrast chest CT in 3-6 months. Indeterminate 1.5 cm hyperdense lateral upper left renal  cortical Lesion noted, mildly increased in size since 02/01/2018 CT. MRI (preferred) or CT abdomen without and with IV contrast indicated for further evaluation to exclude renal neoplasm. Will defer to PCP and Oncology.   5. Disposition: Follow-up with me or APP in 2 months or sooner if anything changes. Follow-up with Dr. Domenic Polite in August 2024.    Medication Adjustments/Labs and Tests Ordered: Current medicines are reviewed at length with the patient today.  Concerns regarding medicines are outlined above.  No orders of the defined types were placed in this encounter.  No orders of the defined types were placed in this encounter.   Patient Instructions  Medication Instructions:  Continue all current medications.   Labwork: none  Testing/Procedures: none  Follow-Up: 2 months - [facility] [place]    Any Other Special Instructions Will Be Listed Below (If Applicable).   If you need a refill on your cardiac medications before your next appointment, please call your pharmacy.    SignedFinis Bud, NP  04/26/2022 10:06 PM    [place]

## 2022-04-25 NOTE — Patient Instructions (Signed)
Medication Instructions:  Continue all current medications.   Labwork: none  Testing/Procedures: none  Follow-Up: 2 months - [place] [facility] - [place]    Any Other Special Instructions Will Be Listed Below (If Applicable).   If you need a refill on your cardiac medications before your next appointment, please call your pharmacy.

## 2022-04-26 ENCOUNTER — Encounter: Payer: Self-pay | Admitting: Nurse Practitioner

## 2022-04-26 ENCOUNTER — Telehealth: Payer: Self-pay | Admitting: Family Medicine

## 2022-04-26 NOTE — Telephone Encounter (Signed)
Pt requesting refill on Citalopram, Hydrocodone, Ambien to [facility].  Pt last seen 12/30/21 for AWV. No upcoming appts scheduled. Please advise. Thank you

## 2022-04-28 ENCOUNTER — Other Ambulatory Visit: Payer: Self-pay | Admitting: Family Medicine

## 2022-04-28 MED ORDER — CITALOPRAM HYDROBROMIDE 20 MG PO TABS: 30.0000 mg | ORAL_TABLET | Freq: Every day | ORAL | 1 refills | Status: DC

## 2022-04-28 MED ORDER — ZOLPIDEM TARTRATE 5 MG PO TABS: 5.0000 mg | ORAL_TABLET | Freq: Every day | ORAL | 2 refills | Status: DC

## 2022-04-28 NOTE — Telephone Encounter (Signed)
Nurses Unfortunately many people just do not understand that pain medications are under strict control by the federal government and require every 63-monthvisit in order to renew So another words if I have not seen a person within 3 months I have to do an office visit before sending it I will go ahead with the other 2 medicines but she needs to schedule an office visit for pain management please schedule her in February thank you

## 2022-04-28 NOTE — Telephone Encounter (Signed)
Spoke with patient and advised per Dr Nicki Reaper Unfortunately many people just do not understand that pain medications are under strict control by the federal government and require every 53-monthvisit in order to renew So another words if I have not seen a person within 3 months I have to do an office visit before sending it I will go ahead with the other 2 medicines but she needs to schedule an office visit for pain management please schedule her in February thank you. Patient verbalized understanding and scheduled appointment with front desk staff.

## 2022-05-01 DIAGNOSIS — E119 Type 2 diabetes mellitus without complications: Secondary | ICD-10-CM | POA: Diagnosis not present

## 2022-05-01 LAB — HM DIABETES EYE EXAM

## 2022-05-03 ENCOUNTER — Other Ambulatory Visit: Payer: Self-pay

## 2022-05-03 ENCOUNTER — Other Ambulatory Visit: Payer: Self-pay | Admitting: Family Medicine

## 2022-05-03 ENCOUNTER — Telehealth: Payer: Self-pay | Admitting: Family Medicine

## 2022-05-03 MED ORDER — CEPHALEXIN 500 MG PO CAPS: 500.0000 mg | ORAL_CAPSULE | Freq: Three times a day (TID) | ORAL | 0 refills | Status: AC

## 2022-05-03 NOTE — Telephone Encounter (Signed)
Patient is requesting something for UTI seen Cancer doctor this morning and was told to go to primary care. [facility]

## 2022-05-03 NOTE — Telephone Encounter (Signed)
Please advise. Thank you

## 2022-05-03 NOTE — Telephone Encounter (Signed)
Typically does not take a long course of antibiotics to treat UTI recommend Keflex 500 mg 1 taken 3 times daily for 5 days Patient has a standard follow-up with [place] next week please keep follow-up sooner problems

## 2022-05-03 NOTE — Telephone Encounter (Signed)
Patient made aware of drs orders and recommendations.

## 2022-05-09 ENCOUNTER — Encounter: Payer: Self-pay | Admitting: Family Medicine

## 2022-05-10 ENCOUNTER — Ambulatory Visit (INDEPENDENT_AMBULATORY_CARE_PROVIDER_SITE_OTHER): Payer: Medicare Other | Admitting: Family Medicine

## 2022-05-10 VITALS — BP 136/65 | Wt 160.8 lb

## 2022-05-10 DIAGNOSIS — E039 Hypothyroidism, unspecified: Secondary | ICD-10-CM | POA: Diagnosis not present

## 2022-05-10 DIAGNOSIS — K219 Gastro-esophageal reflux disease without esophagitis: Secondary | ICD-10-CM

## 2022-05-10 DIAGNOSIS — E785 Hyperlipidemia, unspecified: Secondary | ICD-10-CM

## 2022-05-10 DIAGNOSIS — E1122 Type 2 diabetes mellitus with diabetic chronic kidney disease: Secondary | ICD-10-CM | POA: Diagnosis not present

## 2022-05-10 DIAGNOSIS — N1832 Chronic kidney disease, stage 3b: Secondary | ICD-10-CM | POA: Diagnosis not present

## 2022-05-10 DIAGNOSIS — I25119 Atherosclerotic heart disease of native coronary artery with unspecified angina pectoris: Secondary | ICD-10-CM | POA: Diagnosis not present

## 2022-05-10 DIAGNOSIS — I10 Essential (primary) hypertension: Secondary | ICD-10-CM | POA: Diagnosis not present

## 2022-05-10 DIAGNOSIS — M1000 Idiopathic gout, unspecified site: Secondary | ICD-10-CM | POA: Diagnosis not present

## 2022-05-10 NOTE — Progress Notes (Signed)
   Subjective:    Patient ID: Sabrina Deleon, female    DOB: 20-Sep-1950, 72 y.o.   MRN: [id number]  HPI Patient arrives today for medication follow up. Patient states she is concerned about her blood sugars. Her glucose readings periodically are elevated compared to where they should be in the upper 100s it could be related to the oncology medicine they are utilizing for her.  She denies unhealthy diet she tries to eat as healthy as possible  Hyperlipidemia, unspecified hyperlipidemia type - Plan: Lipid panel  Type 2 diabetes mellitus with stage 3b chronic kidney disease, without long-term current use of insulin (Sabrina Deleon) - Plan: Hemoglobin A1c  Primary hypertension  Hypothyroidism, unspecified type - Plan: TSH  Gastroesophageal reflux disease without esophagitis  Idiopathic gout, unspecified chronicity, unspecified site - Plan: Uric Acid  Coronary artery disease involving native coronary artery of native heart with angina pectoris (Sabrina Deleon) She has lost a significant amount of weight related to her cancer treatments but she is doing better nutritional She denies any significant shortness of breath except when she really pushes herself Followed by cardiology on a regular basis for her stenosis    Review of Systems     Objective:   Physical Exam General-in no acute distress Eyes-no discharge Lungs-respiratory rate normal, CTA CV- murmur noted,RRR Extremities skin warm dry no edema Neuro grossly normal Behavior normal, alert        Assessment & Plan:  1. Hyperlipidemia, unspecified hyperlipidemia type Healthy diet continue statin get LDL below 60 if possible - Lipid panel  2. Type 2 diabetes mellitus with stage 3b chronic kidney disease, without long-term current use of insulin (Sabrina Deleon) Check A1c may need to initiate medications potentially metformin potentially glipizide not a good GLP-1 candidate - Hemoglobin A1c  3. Primary hypertension Blood pressure good control continue  current measures  4. Hypothyroidism, unspecified type Taking thyroid medicine tolerating well check labs - TSH  5. Gastroesophageal reflux disease without esophagitis Under good control we did discuss reducing her PPI to 3 days a week and see she will see how she does with it  6. Idiopathic gout, unspecified chronicity, unspecified site Check uric acid continue allopurinol - Uric Acid  7. Coronary artery disease involving native coronary artery of native heart with angina pectoris [facility]) Continue coronary visits with cardiologist continue medication  Insomnia-continue Ambien at nighttime Depression under good control no longer needing Celexa Follow-up again within 6 months

## 2022-05-11 ENCOUNTER — Ambulatory Visit (HOSPITAL_COMMUNITY)
Admission: RE | Admit: 2022-05-11 | Discharge: 2022-05-11 | Disposition: A | Discharge status: Home / Self Care | Payer: Medicare Other | Source: Ambulatory Visit | Attending: Nurse Practitioner | Admitting: Nurse Practitioner

## 2022-05-11 ENCOUNTER — Encounter (HOSPITAL_COMMUNITY): Payer: Self-pay

## 2022-05-11 DIAGNOSIS — Z13820 Encounter for screening for osteoporosis: Secondary | ICD-10-CM | POA: Insufficient documentation

## 2022-05-11 DIAGNOSIS — M8589 Other specified disorders of bone density and structure, multiple sites: Secondary | ICD-10-CM | POA: Diagnosis not present

## 2022-05-11 DIAGNOSIS — E1122 Type 2 diabetes mellitus with diabetic chronic kidney disease: Secondary | ICD-10-CM | POA: Diagnosis not present

## 2022-05-11 DIAGNOSIS — E785 Hyperlipidemia, unspecified: Secondary | ICD-10-CM | POA: Diagnosis not present

## 2022-05-11 DIAGNOSIS — Z1231 Encounter for screening mammogram for malignant neoplasm of breast: Secondary | ICD-10-CM | POA: Insufficient documentation

## 2022-05-11 DIAGNOSIS — M1000 Idiopathic gout, unspecified site: Secondary | ICD-10-CM | POA: Diagnosis not present

## 2022-05-11 DIAGNOSIS — Z780 Asymptomatic menopausal state: Secondary | ICD-10-CM | POA: Insufficient documentation

## 2022-05-11 DIAGNOSIS — E039 Hypothyroidism, unspecified: Secondary | ICD-10-CM | POA: Diagnosis not present

## 2022-05-11 DIAGNOSIS — N1832 Chronic kidney disease, stage 3b: Secondary | ICD-10-CM | POA: Diagnosis not present

## 2022-05-12 ENCOUNTER — Other Ambulatory Visit: Payer: Self-pay

## 2022-05-12 ENCOUNTER — Encounter: Payer: Self-pay | Admitting: Nurse Practitioner

## 2022-05-12 DIAGNOSIS — M8580 Other specified disorders of bone density and structure, unspecified site: Secondary | ICD-10-CM | POA: Insufficient documentation

## 2022-05-12 LAB — HEMOGLOBIN A1C
Est. average glucose Bld gHb Est-mCnc: 166 mg/dL
Hgb A1c MFr Bld: 7.4 % — ABNORMAL HIGH (ref 4.8–5.6)

## 2022-05-12 LAB — LIPID PANEL
Chol/HDL Ratio: 2.3 ratio (ref 0.0–4.4)
Cholesterol, Total: 119 mg/dL (ref 100–199)
HDL: 51 mg/dL (ref >39)
LDL Chol Calc (NIH): 52 mg/dL (ref 0–99)
Triglycerides: 78 mg/dL (ref 0–149)
VLDL Cholesterol Cal: 16 mg/dL (ref 5–40)

## 2022-05-12 LAB — URIC ACID: Uric Acid: 6.2 mg/dL (ref 3.0–7.2)

## 2022-05-12 LAB — TSH: TSH: 0.498 u[IU]/mL (ref 0.450–4.500)

## 2022-05-12 MED ORDER — POTASSIUM CHLORIDE CRYS ER 10 MEQ PO TBCR: EXTENDED_RELEASE_TABLET | ORAL | 6 refills | Status: DC

## 2022-05-14 ENCOUNTER — Encounter: Payer: Self-pay | Admitting: Family Medicine

## 2022-05-23 ENCOUNTER — Other Ambulatory Visit: Payer: Self-pay | Admitting: Physician Assistant

## 2022-05-23 DIAGNOSIS — C7A8 Other malignant neuroendocrine tumors: Secondary | ICD-10-CM

## 2022-05-24 ENCOUNTER — Other Ambulatory Visit: Payer: Self-pay

## 2022-05-24 ENCOUNTER — Inpatient Hospital Stay: Payer: Medicare Other | Attending: Physician Assistant | Admitting: Physician Assistant

## 2022-05-24 ENCOUNTER — Inpatient Hospital Stay: Payer: Medicare Other

## 2022-05-24 ENCOUNTER — Telehealth: Payer: Self-pay | Admitting: Family Medicine

## 2022-05-24 VITALS — BP 132/53 | HR 60 | Temp 99.0°F | Resp 16

## 2022-05-24 VITALS — BP 128/40 | HR 61 | Temp 97.6°F | Resp 16 | Wt 162.2 lb

## 2022-05-24 DIAGNOSIS — C7B01 Secondary carcinoid tumors of distant lymph nodes: Secondary | ICD-10-CM | POA: Insufficient documentation

## 2022-05-24 DIAGNOSIS — C7A00 Malignant carcinoid tumor of unspecified site: Secondary | ICD-10-CM | POA: Insufficient documentation

## 2022-05-24 DIAGNOSIS — E1165 Type 2 diabetes mellitus with hyperglycemia: Secondary | ICD-10-CM | POA: Diagnosis not present

## 2022-05-24 DIAGNOSIS — C7A8 Other malignant neuroendocrine tumors: Secondary | ICD-10-CM

## 2022-05-24 DIAGNOSIS — R630 Anorexia: Secondary | ICD-10-CM

## 2022-05-24 LAB — CMP (CANCER CENTER ONLY)
ALT: 21 U/L (ref 0–44)
AST: 47 U/L — ABNORMAL HIGH (ref 15–41)
Albumin: 4.3 g/dL (ref 3.5–5.0)
Alkaline Phosphatase: 93 U/L (ref 38–126)
Anion gap: 5 (ref 5–15)
BUN: 40 mg/dL — ABNORMAL HIGH (ref 8–23)
CO2: 30 mmol/L (ref 22–32)
Calcium: 10.3 mg/dL (ref 8.9–10.3)
Chloride: 103 mmol/L (ref 98–111)
Creatinine: 1.25 mg/dL — ABNORMAL HIGH (ref 0.44–1.00)
GFR, Estimated: 46 mL/min — ABNORMAL LOW (ref >60)
Glucose, Bld: 213 mg/dL — ABNORMAL HIGH (ref 70–99)
Potassium: 4.5 mmol/L (ref 3.5–5.1)
Sodium: 138 mmol/L (ref 135–145)
Total Bilirubin: 0.6 mg/dL (ref 0.3–1.2)
Total Protein: 7.5 g/dL (ref 6.5–8.1)

## 2022-05-24 LAB — CBC WITH DIFFERENTIAL (CANCER CENTER ONLY)
Abs Immature Granulocytes: 0.01 10*3/uL (ref 0.00–0.07)
Basophils Absolute: 0.1 10*3/uL (ref 0.0–0.1)
Basophils Relative: 1 %
Eosinophils Absolute: 0.7 10*3/uL — ABNORMAL HIGH (ref 0.0–0.5)
Eosinophils Relative: 11 %
HCT: 33.7 % — ABNORMAL LOW (ref 36.0–46.0)
Hemoglobin: 10.9 g/dL — ABNORMAL LOW (ref 12.0–15.0)
Immature Granulocytes: 0 %
Lymphocytes Relative: 20 %
Lymphs Abs: 1.4 10*3/uL (ref 0.7–4.0)
MCH: 30 pg (ref 26.0–34.0)
MCHC: 32.3 g/dL (ref 30.0–36.0)
MCV: 92.8 fL (ref 80.0–100.0)
Monocytes Absolute: 0.5 10*3/uL (ref 0.1–1.0)
Monocytes Relative: 7 %
Neutro Abs: 4.1 10*3/uL (ref 1.7–7.7)
Neutrophils Relative %: 61 %
Platelet Count: 165 10*3/uL (ref 150–400)
RBC: 3.63 MIL/uL — ABNORMAL LOW (ref 3.87–5.11)
RDW: 14.6 % (ref 11.5–15.5)
WBC Count: 6.7 10*3/uL (ref 4.0–10.5)
nRBC: 0 % (ref 0.0–0.2)

## 2022-05-24 MED ORDER — LANREOTIDE ACETATE 120 MG/0.5ML ~~LOC~~ SOLN
120.0000 mg | Freq: Once | SUBCUTANEOUS | Status: AC
  Administered 2022-05-24: 120 mg via SUBCUTANEOUS
  Filled 2022-05-24: qty 120

## 2022-05-24 NOTE — Progress Notes (Addendum)
[facility] [phone]   [phone]  PROGRESS NOTE  Patient Care Team: Kathyrn Drown, MD as PCP - General (Family Medicine) Satira Sark, MD as PCP - Cardiology (Cardiology)  Hematological/Oncological History # Well Differentiated Neuroendocrine Tumor, GI origin. Grade 2.  11/29/2021: Patient underwent CT angiogram C/A/P prior to consideration of TAVR for severe aortic stenosis, noted to have bulky central mesenteric soft tissue mass that has increased in 2019.  Additionally there was new widespread mild to moderate retroperitoneal, retrocrural, mediastinal and bilateral hilar lymphadenopathy 12/15/2021: NM PET CT Scan showed hypermetabolic mesenteric mass with hypermetabolic adenopathy in the small bowel mesentery, abdominal retroperitoneum and retrocrural stations. 12/28/2021: IR guided biopsy of the mesenteric mass was consistent with well differentiated neuroendocrine tumor 01/10/2022: start of lanreotide '120mg'$  IM q 28 days.   Interval History:  Sabrina Deleon 72 y.o. female with medical history significant for newly diagnosed neuroendocrine tumor who presents for a follow up visit. The patient's last visit was on 03/10/2022. In the interim since the last visit she continued lanreotide treatment.  On exam today Sabrina Deleon reports that she is tolerating treatment without any significant limitations. She reports that since starting lanreotide injections her diarrhea has resolved with several episodes per day and now has a bowel movement every couple of days. She is struggling with glucose control and is waiting to hear from PCP to start on a new diabetes medication. She reports struggling with her appetite and finding the right foods to eat. Her weight has been stable over the last few months. She denies nausea, vomiting or abdominal pain. She denies easy bruising or signs of active bleeding. She denies fevers, chills, sweats, shortness of breath, chest  pain or cough.   She is not having any issues with the injection site such as rash, itching, or soreness. Full 10 point ROS was otherwise negative.   MEDICAL HISTORY:  Past Medical History:  Diagnosis Date   Anxiety    Aortic stenosis    Arthritis    Chronic kidney disease    Dr. Tami Ribas- noted increase kidney function levels- took pt off Metformin and started on Onglyza for Blood sugar control-pt being followed for this.   GERD (gastroesophageal reflux disease)    Gout    Hypertension    Hypothyroidism    Morbid obesity ([place]) 03/29/2018   Patient has elevated BMI with diabetes hyperlipidemia and hypertension   Pancreatitis    Type 2 diabetes mellitus ([place])    Wears glasses    Wears partial dentures    Upper    SURGICAL HISTORY: Past Surgical History:  Procedure Laterality Date   BACK SURGERY     Lumbar   CARPAL TUNNEL RELEASE  11/02/2011   Procedure: CARPAL TUNNEL RELEASE;  Surgeon: Tennis Must, MD;  Location: [facility];  Service: Orthopedics;  Laterality: Right;   CARPAL TUNNEL RELEASE Left 01/14/2015   Procedure: LEFT CARPAL TUNNEL RELEASE;  Surgeon: Leanora Cover, MD;  Location: [facility];  Service: Orthopedics;  Laterality: Left;   CHOLECYSTECTOMY     laparoscopic   COLONOSCOPY     COLONOSCOPY N/A 11/12/2015   Procedure: COLONOSCOPY;  Surgeon: Rogene Houston, MD;  [facility];  Service: Endoscopy;  Laterality: N/A;  9:30   ESOPHAGOGASTRODUODENOSCOPY N/A 11/12/2015   Procedure: ESOPHAGOGASTRODUODENOSCOPY (EGD);  Surgeon: Rogene Houston, MD;  [facility];  Service: Endoscopy;  Laterality: N/A;   REVERSE SHOULDER ARTHROPLASTY Right 11/04/2018  REVERSE SHOULDER ARTHROPLASTY Right 11/04/2018   Procedure: REVERSE SHOULDER ARTHROPLASTY;  Surgeon: Verner Mould, MD;  Location: [facility];  Service: Orthopedics;  Laterality: Right;   RIGHT/LEFT HEART CATH AND CORONARY ANGIOGRAPHY N/A 10/18/2017   Procedure: RIGHT/LEFT  HEART CATH AND CORONARY ANGIOGRAPHY;  Surgeon: Burnell Blanks, MD;  Location: [facility];  Service: Cardiovascular;  Laterality: N/A;   TOTAL HIP ARTHROPLASTY Left 03/22/2016   Procedure: LEFT TOTAL HIP ARTHROPLASTY ANTERIOR APPROACH;  Surgeon: Gaynelle Arabian, MD;  Location: [facility];  Service: Orthopedics;  Laterality: Left;    SOCIAL HISTORY: Social History   Socioeconomic History   Marital status: Married    Spouse name: Not on file   Number of children: Not on file   Years of education: Not on file   Highest education level: Not on file  Occupational History   Not on file  Tobacco Use   Smoking status: Former    Types: Cigarettes    Quit date: 10/29/1981    Years since quitting: 40.5   Smokeless tobacco: Never  Vaping Use   Vaping Use: Never used  Substance and Sexual Activity   Alcohol use: Yes    Comment: occasionally   Drug use: No   Sexual activity: Not Currently  Other Topics Concern   Not on file  Social History Narrative   Not on file   Social Determinants of Health   Financial Resource Strain: Not on file  Food Insecurity: Not on file  Transportation Needs: Not on file  Physical Activity: Not on file  Stress: Not on file  Social Connections: Not on file  Intimate Partner Violence: Not on file    FAMILY HISTORY: Family History  Problem Relation Age of Onset   Breast cancer Mother    Lung cancer Mother        likely recurrent breast cancer   Breast cancer Paternal Grandmother    Head & neck cancer Nephew     ALLERGIES:  is allergic to bee venom.  MEDICATIONS:  Current Outpatient Medications  Medication Sig Dispense Refill   allopurinol (ZYLOPRIM) 100 MG tablet TAKE 1 TABLET(100 MG) BY MOUTH TWICE DAILY 180 tablet 1   aspirin EC 81 MG tablet Take 81 mg by mouth at bedtime.     calcium carbonate (TUMS - DOSED IN MG ELEMENTAL CALCIUM) 500 MG chewable tablet Chew 1-2 tablets by mouth daily as needed for indigestion or heartburn.      Cholecalciferol (VITAMIN D) 50 MCG (2000 UT) tablet Take 2,000 Units by mouth daily.     dicyclomine (BENTYL) 10 MG capsule Take 1 capsule (10 mg total) by mouth 3 (three) times daily before meals. 90 capsule 0   ferrous sulfate 325 (65 FE) MG tablet Take 325 mg by mouth daily with breakfast.     furosemide (LASIX) 40 MG tablet TAKE 1 TABLET(40 MG) BY MOUTH DAILY 90 tablet 1   ketoconazole (NIZORAL) 2 % cream Apply 1 Application topically daily as needed (fungus). 15 g 0   levothyroxine (SYNTHROID) 125 MCG tablet Take 1 tablet (125 mcg total) by mouth daily before breakfast. 90 tablet 1   lisinopril (ZESTRIL) 20 MG tablet Take 10 mg by mouth daily.     MISC NATURAL PRODUCTS PO Take 1 tablet by mouth daily. Super Beets otc supplement     ondansetron (ZOFRAN) 8 MG tablet Take 1 tablet (8 mg total) by mouth every 8 (eight) hours as needed. 30 tablet 0   ONETOUCH ULTRA  test strip TEST ONCE DAILY 50 strip 5   pantoprazole (PROTONIX) 40 MG tablet TAKE 1 TABLET(40 MG) BY MOUTH DAILY (Patient taking differently: Take 40 mg by mouth daily.) 90 tablet 1   potassium chloride (KLOR-CON M) 10 MEQ tablet TAKE 1 TABLET BY MOUTH EVERY DAY ON MONDAY, WEDNESDAY AND FRIDAY 14 tablet 6   rosuvastatin (CRESTOR) 10 MG tablet TAKE 1 TABLET(10 MG) BY MOUTH DAILY FOR CHOLESTEROL. STOP WHILE TAKING COLCHICINE FOR GOUT 90 tablet 1   zolpidem (AMBIEN) 5 MG tablet Take 1 tablet (5 mg total) by mouth at bedtime. 30 tablet 2   citalopram (CELEXA) 20 MG tablet Take 1.5 tablets (30 mg total) by mouth daily. (Patient not taking: Reported on 05/24/2022) 135 tablet 1   HYDROcodone-acetaminophen (NORCO/VICODIN) 5-325 MG tablet Take one tab po BID prn pain (Patient not taking: Reported on 05/24/2022) 45 tablet 0   No current facility-administered medications for this visit.    REVIEW OF SYSTEMS:   Constitutional: ( - ) fevers, ( - )  chills , ( - ) night sweats Eyes: ( - ) blurriness of vision, ( - ) double vision, ( - ) watery  eyes Ears, nose, mouth, throat, and face: ( - ) mucositis, ( - ) sore throat Respiratory: ( - ) cough, ( - ) dyspnea, ( - ) wheezes Cardiovascular: ( - ) palpitation, ( - ) chest discomfort, ( - ) lower extremity swelling Gastrointestinal:  ( - ) nausea, ( - ) heartburn, ( - ) change in bowel habits Skin: ( - ) abnormal skin rashes Lymphatics: ( - ) new lymphadenopathy, ( - ) easy bruising Neurological: ( - ) numbness, ( - ) tingling, ( - ) new weaknesses Behavioral/Psych: ( - ) mood change, ( - ) new changes  All other systems were reviewed with the patient and are negative.  PHYSICAL EXAMINATION: ECOG PERFORMANCE STATUS: 1 - Symptomatic but completely ambulatory  Vitals:   05/24/22 1126  BP: (!) 128/40  Pulse: 61  Resp: 16  Temp: 97.6 F (36.4 C)  SpO2: 96%   Filed Weights   05/24/22 1126  Weight: 162 lb 3.2 oz (73.6 kg)    GENERAL: Well-appearing elderly Caucasian female, alert, no distress and comfortable SKIN: skin color, texture, turgor are normal, no rashes or significant lesions EYES: conjunctiva are pink and non-injected, sclera clear LUNGS: clear to auscultation and percussion with normal breathing effort HEART: regular rate & rhythm and no murmurs and no lower extremity edema Musculoskeletal: no cyanosis of digits and no clubbing  PSYCH: alert & oriented x 3, fluent speech NEURO: no focal motor/sensory deficits  LABORATORY DATA:  I have reviewed the data as listed    Latest Ref Rng & Units 05/24/2022   11:01 AM 04/21/2022   11:32 AM 03/10/2022   10:55 AM  CBC  WBC 4.0 - 10.5 K/uL 6.7  6.1  5.5   Hemoglobin 12.0 - 15.0 g/dL 10.9  9.7  10.3   Hematocrit 36.0 - 46.0 % 33.7  29.9  32.1   Platelets 150 - 400 K/uL 165  174  142        Latest Ref Rng & Units 05/24/2022   11:01 AM 04/21/2022   11:32 AM 03/10/2022   10:55 AM  CMP  Glucose 70 - 99 mg/dL 213  181  199   BUN 8 - 23 mg/dL 40  39  41   Creatinine 0.44 - 1.00 mg/dL 1.25  1.29  1.31   Sodium 135 -  145  mmol/L 138  140  139   Potassium 3.5 - 5.1 mmol/L 4.5  4.4  4.0   Chloride 98 - 111 mmol/L 103  107  103   CO2 22 - 32 mmol/L 30  28  33   Calcium 8.9 - 10.3 mg/dL 10.3  10.5  10.5   Total Protein 6.5 - 8.1 g/dL 7.5  6.8  7.3   Total Bilirubin 0.3 - 1.2 mg/dL 0.6  0.4  0.6   Alkaline Phos 38 - 126 U/L 93  68  68   AST 15 - 41 U/L 47  81  72   ALT 0 - 44 U/L 21  52  42    RADIOGRAPHIC STUDIES: MM 3D SCREEN BREAST BILATERAL  Result Date: 05/15/2022 CLINICAL DATA:  Screening. EXAM: DIGITAL SCREENING BILATERAL MAMMOGRAM WITH TOMOSYNTHESIS AND CAD TECHNIQUE: Bilateral screening digital craniocaudal and mediolateral oblique mammograms were obtained. Bilateral screening digital breast tomosynthesis was performed. The images were evaluated with computer-aided detection. COMPARISON:  Previous exam(s). ACR Breast Density Category b: There are scattered areas of fibroglandular density. FINDINGS: There are no findings suspicious for malignancy. IMPRESSION: No mammographic evidence of malignancy. A result letter of this screening mammogram will be mailed directly to the patient. RECOMMENDATION: Screening mammogram in one year. (Code:SM-B-01Y) BI-RADS CATEGORY  1: Negative. Electronically Signed   By: Lovey Newcomer M.D.   On: 05/15/2022 07:59   DG Bone Density  Result Date: 05/11/2022 EXAM: DUAL X-RAY ABSORPTIOMETRY (DXA) FOR BONE MINERAL DENSITY IMPRESSION: Your patient Sabrina Deleon completed a BMD test on 05/11/2022 using the [place] (software version: 14.10) manufactured by [employer]. The following summarizes the results of our evaluation. Technologist: [facility] PATIENT BIOGRAPHICAL: Name: Sabrina Deleon, Sabrina Deleon Patient ID: [id number] Birth Date: [birth year] Height: 68.5 in. Gender: Female Exam Date: 05/11/2022 Weight: 160.0 lbs. Indications: Back surgery, Caucasian, Height Loss, History of Fracture (Adult), Hyperthyroid, Parent Hip Fracture, Post Menopausal, Renal, Family Hist. (Parent hip  fracture) Fractures: Clavicle, Humerus Treatments: Asprin, Calcium, Multivitamin, Synthroid, Vitamin D DENSITOMETRY RESULTS: Site         Region     Measured Date Measured Age WHO Classification Young Adult T-score BMD         %Change vs. Previous Significant Change (*) Right Femur Neck 05/11/2022 70.8 Normal -0.4 0.976 g/cm2 -7.5% Yes Right Femur Neck 01/09/2019 67.5 Normal 0.1 1.055 g/cm2 -5.0% Yes Right Femur Neck 04/30/2015 63.8 Normal 0.5 1.110 g/cm2 - - Right Femur Total 05/11/2022 70.8 Osteopenia -1.4 0.826 g/cm2 -12.6% Yes Right Femur Total 01/09/2019 67.5 Normal -0.5 0.945 g/cm2 -12.2% Yes Right Femur Total 04/30/2015 63.8 Normal 0.5 1.076 g/cm2 - - Left Forearm Radius 33% 05/11/2022 70.8 Osteopenia -2.0 0.571 g/cm2 -13.2% Yes Left Forearm Radius 33% 01/09/2019 67.5 Normal -0.8 0.658 g/cm2 -12.0% Yes Left Forearm Radius 33% 04/30/2015 63.8 Normal 0.5 0.748 g/cm2 -2.4% - Left Forearm Radius 33% 11/17/2009 58.3 Normal 0.7 0.767 g/cm2 - - ASSESSMENT: The BMD measured at Forearm Radius 33% is 0.571 g/cm2 with a T-score of -2.0. This patient is considered osteopenic according to [facility]) criteria. The scan quality is good. Compared with the prior study on 01/09/19, the BMD of the rt. total hip and lt. forearm show a statistically significant decrease. Lumbar spine was excluded due to advanced degenerative changes. Lt. hip was excluded due to surgical repair. World Health Organization [facility]) criteria for post-menopausal, Caucasian Women: Normal:       T-score at or above -1 SD Osteopenia:  T-score between -1 and -2.5 SD Osteoporosis: T-score at or below -2.5 SD RECOMMENDATIONS: 1. All patients should optimize calcium and vitamin D intake. 2. Consider FDA-approved medical therapies in postmenopausal women and med aged 60 years and older, based on the following: a. A hip or vertebral (clinical or morphometric) fracture b. T-score< -2.5 at the femoral neck or spine after appropriate evaluation  to exclude secondary causes c. Low bone mass (T-score between -1.0 and -2.5 at the femoral neck or spine) and a 10-year probability of a hip fracture > 3% or a 10-year probability of a major osteoporosis-related fracture > 20% based on the [place]-adapted WHO algorithm d. Clinician judgment and/or patient preferences may indicate treatment for people with 10-year fracture probabilities above or below these levels FOLLOW-UP: Patients with diagnosis of osteoporsis or at high risk for fracture should have regular bone mineral density tests. For patients eligible for Medicare, routine testing is allowed once every 2 years. The testing frequency can be increased to one year for patients who have rapidly progressing disease, those who are receiving or discontinuing medical therapy to restore bone mass, or have additional risk factors. I have reviewed this report, and agree with the above findings. [facility] Your patient Sabrina Deleon completed a FRAX assessment on 05/11/2022 using the [place] (analysis version: 14.10) manufactured by [employer]. The following summarizes the results of our evaluation. PATIENT BIOGRAPHICAL: Name: Sabrina, Deleon Patient ID: [id number] Birth Date: 1950-08-09 Height:    68.5 in. Gender:     Female    [age]       Weight:    160.0 lbs. Ethnicity:  White                            Exam Date: 05/11/2022 FRAX* RESULTS:  (version: 3.5) 10-year Probability of Fracture1 Major Osteoporotic Fracture2 Hip Fracture 18.5% 2.2% Population: [place] (Caucasian) Risk Factors: History of Fracture (Adult), Family Hist. (Parent hip fracture) Based on Femur (Right) Neck BMD 1 -The 10-year probability of fracture may be lower than reported if the patient has received treatment. 2 -Major Osteoporotic Fracture: Clinical Spine, Forearm, Hip or Shoulder *FRAX is a Materials engineer of the [employer] for Metabolic Bone Disease, a [place] (WHO) [facility]. ASSESSMENT: The probability of a major osteoporotic fracture is 18.5% within the next ten years. The probability of a hip fracture is 2.2% within the next ten years. Electronically Signed   By: Zerita Boers M.D.   On: 05/11/2022 08:52    ASSESSMENT & PLAN Sabrina Deleon 72 y.o. female with medical history significant for newly diagnosed neuroendocrine tumor who presents for a follow up visit.  #Metastatic Well Differentiated Neuroendocrine Tumor of the GI Tract -- CT imaging and PET CT scan confirmed widespread lymphadenopathy consistent with metastatic spread of well-differentiated neuroendocrine tumor. -- Biopsy confirms this is a well differentiated neuroendocrine tumor of the GI tract. -- Today we will proceed with lanreotide 120 mg IM q. 28 days. --labs today show white blood cell 6.7, hemoglobin 10.9, MCV 92.8, and platelets of 165 --Due for repeat CT imaging to confirm treatment response. We will order and try to schedule over the next 4 weeks.  -- Have patient return to clinic in 12 weeks time with interval monthly shots.   #Appetite changes: --Will refer to [facility] Nutrition team for further recommendations.   #  Hyperglycemia: --Today's glucose level is 213. --Hemoglobin A1c increased to 7.4 on 05/11/2022 --Recommend to follow up with PCP to discuss interventions including starting new medications.   Orders Placed This Encounter  Procedures   Ambulatory Referral to [facility] Nutrition    Referral Priority:   Routine    Referral Type:   Consultation    Referral Reason:   Specialty Services Required    Number of Visits Requested:   1    All questions were answered. The patient knows to call the clinic with any problems, questions or concerns.  I have spent a total of 30 minutes minutes of face-to-face and non-face-to-face time, preparing to see the patient, performing a medically appropriate examination, counseling and educating the patient,  ordering tests/procedures, referring and communicating with other health care professionals, documenting clinical information in the electronic health record, and care coordination.   Dede Query PA-C Dept of Hematology and [place] at [facility] Phone: [phone]   05/24/2022 1:26 PM

## 2022-05-24 NOTE — Telephone Encounter (Signed)
Call patient with recommendations regarding diabetes

## 2022-05-25 ENCOUNTER — Other Ambulatory Visit: Payer: Self-pay | Admitting: Family Medicine

## 2022-05-25 ENCOUNTER — Encounter: Payer: Self-pay | Admitting: Family Medicine

## 2022-05-25 MED ORDER — DAPAGLIFLOZIN PROPANEDIOL 5 MG PO TABS: 5.0000 mg | ORAL_TABLET | Freq: Every day | ORAL | 2 refills | Status: DC

## 2022-05-25 NOTE — Telephone Encounter (Signed)
Long discussion with the patient discussing the different options for her treatment.  She is not a good candidate for metformin because of lower GFR.  We did go ahead with Farxiga 5 mg Side effect discussed  Nurses-please order metabolic 7 she will complete this in 7 to 10 days diagnosis CKD with diabetes 3A

## 2022-05-26 ENCOUNTER — Other Ambulatory Visit: Payer: Self-pay

## 2022-05-26 DIAGNOSIS — E1122 Type 2 diabetes mellitus with diabetic chronic kidney disease: Secondary | ICD-10-CM

## 2022-05-26 LAB — CHROMOGRANIN A: Chromogranin A (ng/mL): 107.9 ng/mL — ABNORMAL HIGH (ref 0.0–101.8)

## 2022-05-29 ENCOUNTER — Other Ambulatory Visit: Payer: Self-pay | Admitting: Family Medicine

## 2022-05-31 ENCOUNTER — Other Ambulatory Visit: Payer: Self-pay | Admitting: Family Medicine

## 2022-06-02 ENCOUNTER — Ambulatory Visit: Payer: Medicare Other | Admitting: Family Medicine

## 2022-06-03 ENCOUNTER — Encounter: Payer: Self-pay | Admitting: Nurse Practitioner

## 2022-06-07 ENCOUNTER — Telehealth: Payer: Self-pay | Admitting: Family Medicine

## 2022-06-07 NOTE — Telephone Encounter (Signed)
Patient stopped taking hydrocodone,celexa,ambien all at the same time. Now she is not sleeping hardly any,affecting mood& irritability. She would like to restart some/all to be able to sleep and function. [address] [place] [address]

## 2022-06-07 NOTE — Telephone Encounter (Signed)
Nurses Questions for the patient When was the last time she took any of these medicines? Did she abruptly stop taking the Celexa or did she taper down? As for the hydrocodone was she taking on a regular basis then abruptly stopped?  With her moods how she is doing in terms of sadness?  Anxiety?  Irritability?  We may need to also schedule her a follow-up visit because this is a pretty dog gone complex issue but we will try to help her by phone first then more than likely will have to schedule follow-up visit thank you

## 2022-06-08 NOTE — Telephone Encounter (Signed)
Patient did not taper down. She is having trouble with mood swings, irritability and not sleeping. Patient has had a recent office visit for check up with Dr Nicki Reaper. Patient's main concern is getting sleep. Patient was on Ambien previously. Please advise.

## 2022-06-09 ENCOUNTER — Ambulatory Visit (HOSPITAL_COMMUNITY)
Admission: RE | Admit: 2022-06-09 | Discharge: 2022-06-09 | Disposition: A | Discharge status: Home / Self Care | Payer: Medicare Other | Source: Ambulatory Visit | Attending: Physician Assistant | Admitting: Physician Assistant

## 2022-06-09 ENCOUNTER — Other Ambulatory Visit: Payer: Self-pay | Admitting: Family Medicine

## 2022-06-09 DIAGNOSIS — C7A8 Other malignant neuroendocrine tumors: Secondary | ICD-10-CM | POA: Insufficient documentation

## 2022-06-09 DIAGNOSIS — R590 Localized enlarged lymph nodes: Secondary | ICD-10-CM | POA: Diagnosis not present

## 2022-06-09 MED ORDER — CLONAZEPAM 0.5 MG PO TABS: ORAL_TABLET | ORAL | 2 refills | Status: DC

## 2022-06-09 MED ORDER — IOHEXOL 300 MG/ML  SOLN
80.0000 mL | Freq: Once | INTRAMUSCULAR | Status: AC | PRN
  Administered 2022-06-09: 80 mL via INTRAVENOUS

## 2022-06-09 MED ORDER — IOHEXOL 9 MG/ML PO SOLN
ORAL | Status: AC
  Filled 2022-06-09: qty 1000

## 2022-06-09 MED ORDER — IOHEXOL 9 MG/ML PO SOLN
500.0000 mL | ORAL | Status: AC
  Administered 2022-06-09: 1000 mL via ORAL

## 2022-06-09 NOTE — Telephone Encounter (Signed)
Called patient and advised per Dr Nicki Reaper  So there is limited options 1 option is utilizing Klonopin at nighttime to try to help with sleep-if she is interested in this let me know and I will send in a prescription Typical would be 1 tablet each evening hopefully that will help if not she will need to let us know This is a temporary option but would be more likely to help her with sleep without having as much negatives that come with it as Ambien does then she can move her appointment from June into a more closer appointment (which still could be several weeks from now) and at that visit we can discuss additional options   If the Klonopin does not help once again I would want to see her  Patient verbalized understanding and patient agrees with recommendations (trying the Klonopin)  Please notify nurses/CMA when Rx is sent in so we can notify patient.

## 2022-06-09 NOTE — Telephone Encounter (Signed)
Patient checking on status.

## 2022-06-09 NOTE — Telephone Encounter (Signed)
So there is limited options 1 option is utilizing Klonopin at nighttime to try to help with sleep-if she is interested in this let me know and I will send in a prescription Typical would be 1 tablet each evening hopefully that will help if not she will need to let us know This is a temporary option but would be more likely to help her with sleep without having as much negatives that come with it as Ambien does then she can move her appointment from June into a more closer appointment (which still could be several weeks from now) and at that visit we can discuss additional options  If the Klonopin does not help once again I would want to see her

## 2022-06-09 NOTE — Telephone Encounter (Signed)
Prescription was sent to [facility] on [address] as requested I would recommend that the patient start off with 1/2 tablet in the evening to see if that helps with sleep If that does not help enough on future evening she can do a full tablet If possible I encourage patient to send [place] a MyChart message within a couple weeks letting us know how she feels things are going These medications can increase drowsiness so is very important patient not drive with taking these medicines plus also be very careful with any moving around at nighttime to lessen the risk of falls

## 2022-06-09 NOTE — Telephone Encounter (Signed)
Called patient and advised per Dr Nicki Reaper  Prescription was sent to [facility] on [address] as requested I would recommend that the patient start off with 1/2 tablet in the evening to see if that helps with sleep If that does not help enough on future evening she can do a full tablet If possible I encourage patient to send [place] a MyChart message within a couple weeks letting us know how she feels things are going These medications can increase drowsiness so is very important patient not drive with taking these medicines plus also be very careful with any moving around at nighttime to lessen the risk of falls  Patient verbalized understanding and agrees with Dr Bary Leriche recommendations.

## 2022-06-12 ENCOUNTER — Other Ambulatory Visit: Payer: Self-pay | Admitting: Family Medicine

## 2022-06-12 ENCOUNTER — Telehealth: Payer: Self-pay | Admitting: Physician Assistant

## 2022-06-12 ENCOUNTER — Telehealth: Payer: Self-pay | Admitting: Family Medicine

## 2022-06-12 NOTE — Telephone Encounter (Signed)
I called Sabrina Deleon to review the CT scan results from 06/09/2022. Findings show stable mesenteric mass and decrease in size of lymphadenopathy. Recommend to continue on lanreotide injections. Patient expressed understanding of the plan provided.

## 2022-06-12 NOTE — Telephone Encounter (Signed)
Nurses Please clarify with patient Is she currently taking citalopram daily? She was on 1-1/2 tablet daily but if she has been off of this for a while it would be wise for her to restart at 20 mg daily Please clarify and we can send this in  If she is already taking the Celexa you may send in 90-day refill with 1 additional refill If she is not taking the Celexa at all I recommend 20 mg daily, #90, 1 refill  Please clarify then move forward with the above instructions thank you Patient should give [place] an update within 2 to 3 weeks how she is doing

## 2022-06-12 NOTE — Telephone Encounter (Signed)
Patient  requesting new prescription for Celexa be sent to [facility] [address]

## 2022-06-13 ENCOUNTER — Other Ambulatory Visit: Payer: Self-pay

## 2022-06-13 DIAGNOSIS — S62306A Unspecified fracture of fifth metacarpal bone, right hand, initial encounter for closed fracture: Secondary | ICD-10-CM | POA: Diagnosis not present

## 2022-06-13 DIAGNOSIS — M79641 Pain in right hand: Secondary | ICD-10-CM | POA: Diagnosis not present

## 2022-06-13 MED ORDER — CITALOPRAM HYDROBROMIDE 20 MG PO TABS: 20.0000 mg | ORAL_TABLET | Freq: Every day | ORAL | 1 refills | Status: DC

## 2022-06-15 ENCOUNTER — Encounter: Payer: Medicare Other | Admitting: Orthopedic Surgery

## 2022-06-15 ENCOUNTER — Encounter: Payer: Self-pay | Admitting: Orthopedic Surgery

## 2022-06-15 ENCOUNTER — Ambulatory Visit (INDEPENDENT_AMBULATORY_CARE_PROVIDER_SITE_OTHER): Payer: Medicare Other | Admitting: Orthopedic Surgery

## 2022-06-15 DIAGNOSIS — S62346A Nondisplaced fracture of base of fifth metacarpal bone, right hand, initial encounter for closed fracture: Secondary | ICD-10-CM

## 2022-06-15 NOTE — Progress Notes (Signed)
Office Visit Note   Patient: Sabrina Deleon           Date of Birth: May 25, 1950           MRN: [id number] Visit Date: 06/15/2022 Requested by: Kathyrn Drown, MD [address] [place],  [place] [address] PCP: Kathyrn Drown, MD  Subjective: Chief Complaint  Patient presents with   Hand Injury    Work in for hand fracture     HPI: This is a 72 year old female who fell at home and injured her right hand presented to urgent care on the 26 with a date of injury of Saturday the 23rd complaining of pain over the lateral aspect of the right hand with swelling.  X-rays show a comminuted fracture of the base of the fifth metacarpal and near the [facility] joint it appears to be extra-articular complains of pain and swelling she can move most of her fingers including the small finger of the right hand  She has multiple medical problems including aortic stenosis hypertension diabetes kidney disease thyroid disease and an abdominal mass consistent with a tumor undergoing treatment              ROS: No recent fever or chills no numbness or tingling in the hand  Assessment & Plan:  Images personally read and my interpretation : Outside images see media I copied the disc pictures she has a comminuted fracture at the base of the fifth metacarpal near the [facility] joint appears to be extra-articular  Visit Diagnoses:  1. Closed nondisplaced fracture of base of fifth metacarpal bone of right hand, initial encounter     Plan: Short arm cast for 4 weeks and then brace for 4 weeks with active range of motion allowed throughout  Follow-Up Instructions: Return in about 4 weeks (around 07/13/2022) for FOLLOW UP, FRACTURE CARE, XR OOP, RIGHT, HAND.   Orders:  No orders of the defined types were placed in this encounter.     Objective: Vital Signs: There were no vitals taken for this visit.  Weight 163  Physical Exam Vitals and nursing note reviewed.  Constitutional:      Appearance: Normal  appearance.  HENT:     Head: Normocephalic and atraumatic.  Eyes:     General: No scleral icterus.       Right eye: No discharge.        Left eye: No discharge.     Extraocular Movements: Extraocular movements intact.     Conjunctiva/sclera: Conjunctivae normal.     Pupils: Pupils are equal, round, and reactive to light.  Cardiovascular:     Rate and Rhythm: Normal rate.     Pulses: Normal pulses.  Skin:    General: Skin is warm and dry.     Capillary Refill: Capillary refill takes less than 2 seconds.  Neurological:     General: No focal deficit present.     Mental Status: She is alert and oriented to person, place, and time.  Psychiatric:        Mood and Affect: Mood normal.        Behavior: Behavior normal.        Thought Content: Thought content normal.        Judgment: Judgment normal.      Ortho Exam Right hand skin has bruising and ecchymosis over the dorsal lateral aspect.  Tenderness at the base of the fifth metacarpal.  No motion at the fracture site.  She can move  all her fingers.  I checked rotation by comparing the opposite hand the rotation look normal she has a crossover of the small with the ring on both hands  Specialty Comments:  No specialty comments available.  Imaging: No results found.   PMFS History: Patient Active Problem List   Diagnosis Date Noted   Osteopenia 05/12/2022   Coronary artery disease involving native coronary artery of native heart with angina pectoris ([place]) 05/10/2022   Neuroendocrine cancer ([place]) 01/03/2022   Mesenteric mass 12/13/2021   Anemia 08/29/2021   Chronic diarrhea 08/29/2021   Gastroesophageal reflux disease without esophagitis 05/28/2021   Postcholecystectomy diarrhea 12/26/2019   Closed fracture of right proximal humerus 11/04/2018   Primary osteoarthritis involving multiple joints 09/12/2018   Severe aortic stenosis    Iron deficiency anemia 10/13/2017   Anxiety and depression 04/28/2017   Encounter for  long-term opiate analgesic use 04/28/2017   Psychophysiological insomnia 04/28/2017   OA (osteoarthritis) of hip 03/22/2016   Chronic kidney disease (CKD) stage G3b/A1, moderately decreased glomerular filtration rate (GFR) between 30-44 mL/min/1.73 square meter and albuminuria creatinine ratio less than 30 mg/g (HCC) 06/03/2015   Type 2 diabetes mellitus ([place]) [id number]   Diastolic dysfunction, left ventricle 05/16/2013   Calcific aortic stenosis 05/16/2013   Gout 05/12/2013   Diabetic retinopathy ([place]) 12/17/2012   Hypertension 06/10/2012   Hyperlipidemia 06/10/2012   Hypothyroidism 06/10/2012   Past Medical History:  Diagnosis Date   Anxiety    Aortic stenosis    Arthritis    Chronic kidney disease    Dr. Tami Ribas- noted increase kidney function levels- took pt off Metformin and started on Onglyza for Blood sugar control-pt being followed for this.   GERD (gastroesophageal reflux disease)    Gout    Hypertension    Hypothyroidism    Morbid obesity ([place]) 03/29/2018   Patient has elevated BMI with diabetes hyperlipidemia and hypertension   Pancreatitis    Type 2 diabetes mellitus (HCC)    Wears glasses    Wears partial dentures    Upper    Family History  Problem Relation Age of Onset   Breast cancer Mother    Lung cancer Mother        likely recurrent breast cancer   Breast cancer Paternal Grandmother    Head & neck cancer Nephew     Past Surgical History:  Procedure Laterality Date   BACK SURGERY     Lumbar   CARPAL TUNNEL RELEASE  11/02/2011   Procedure: CARPAL TUNNEL RELEASE;  Surgeon: Tennis Must, MD;  Location: [facility];  Service: Orthopedics;  Laterality: Right;   CARPAL TUNNEL RELEASE Left 01/14/2015   Procedure: LEFT CARPAL TUNNEL RELEASE;  Surgeon: Leanora Cover, MD;  Location: [facility];  Service: Orthopedics;  Laterality: Left;   CHOLECYSTECTOMY     laparoscopic   COLONOSCOPY     COLONOSCOPY N/A 11/12/2015   Procedure:  COLONOSCOPY;  Surgeon: Rogene Houston, MD;  [facility];  Service: Endoscopy;  Laterality: N/A;  9:30   ESOPHAGOGASTRODUODENOSCOPY N/A 11/12/2015   Procedure: ESOPHAGOGASTRODUODENOSCOPY (EGD);  Surgeon: Rogene Houston, MD;  [facility];  Service: Endoscopy;  Laterality: N/A;   REVERSE SHOULDER ARTHROPLASTY Right 11/04/2018   REVERSE SHOULDER ARTHROPLASTY Right 11/04/2018   Procedure: REVERSE SHOULDER ARTHROPLASTY;  Surgeon: Verner Mould, MD;  Location: [facility];  Service: Orthopedics;  Laterality: Right;   RIGHT/LEFT HEART CATH AND CORONARY ANGIOGRAPHY N/A 10/18/2017   Procedure:  RIGHT/LEFT HEART CATH AND CORONARY ANGIOGRAPHY;  Surgeon: Burnell Blanks, MD;  Location: [facility];  Service: Cardiovascular;  Laterality: N/A;   TOTAL HIP ARTHROPLASTY Left 03/22/2016   Procedure: LEFT TOTAL HIP ARTHROPLASTY ANTERIOR APPROACH;  Surgeon: Gaynelle Arabian, MD;  Location: [facility];  Service: Orthopedics;  Laterality: Left;   Social History   Occupational History   Not on file  Tobacco Use   Smoking status: Former    Types: Cigarettes    Quit date: 10/29/1981    Years since quitting: 40.6   Smokeless tobacco: Never  Vaping Use   Vaping Use: Never used  Substance and Sexual Activity   Alcohol use: Yes    Comment: occasionally   Drug use: No   Sexual activity: Not Currently

## 2022-06-21 ENCOUNTER — Inpatient Hospital Stay: Payer: Medicare Other | Attending: Physician Assistant | Admitting: Dietician

## 2022-06-21 ENCOUNTER — Inpatient Hospital Stay: Payer: Medicare Other

## 2022-06-21 ENCOUNTER — Other Ambulatory Visit: Payer: Self-pay | Admitting: Hematology and Oncology

## 2022-06-21 VITALS — BP 132/48 | HR 58 | Temp 98.7°F | Resp 16

## 2022-06-21 DIAGNOSIS — E1122 Type 2 diabetes mellitus with diabetic chronic kidney disease: Secondary | ICD-10-CM | POA: Diagnosis not present

## 2022-06-21 DIAGNOSIS — C7A00 Malignant carcinoid tumor of unspecified site: Secondary | ICD-10-CM | POA: Insufficient documentation

## 2022-06-21 DIAGNOSIS — C7A8 Other malignant neuroendocrine tumors: Secondary | ICD-10-CM

## 2022-06-21 DIAGNOSIS — N1832 Chronic kidney disease, stage 3b: Secondary | ICD-10-CM | POA: Diagnosis not present

## 2022-06-21 DIAGNOSIS — C7B01 Secondary carcinoid tumors of distant lymph nodes: Secondary | ICD-10-CM | POA: Insufficient documentation

## 2022-06-21 LAB — CBC WITH DIFFERENTIAL (CANCER CENTER ONLY)
Abs Immature Granulocytes: 0.01 10*3/uL (ref 0.00–0.07)
Basophils Absolute: 0 10*3/uL (ref 0.0–0.1)
Basophils Relative: 0 %
Eosinophils Absolute: 0.2 10*3/uL (ref 0.0–0.5)
Eosinophils Relative: 4 %
HCT: 30.1 % — ABNORMAL LOW (ref 36.0–46.0)
Hemoglobin: 9.7 g/dL — ABNORMAL LOW (ref 12.0–15.0)
Immature Granulocytes: 0 %
Lymphocytes Relative: 16 %
Lymphs Abs: 1.1 10*3/uL (ref 0.7–4.0)
MCH: 29.8 pg (ref 26.0–34.0)
MCHC: 32.2 g/dL (ref 30.0–36.0)
MCV: 92.3 fL (ref 80.0–100.0)
Monocytes Absolute: 0.6 10*3/uL (ref 0.1–1.0)
Monocytes Relative: 9 %
Neutro Abs: 4.7 10*3/uL (ref 1.7–7.7)
Neutrophils Relative %: 71 %
Platelet Count: 179 10*3/uL (ref 150–400)
RBC: 3.26 MIL/uL — ABNORMAL LOW (ref 3.87–5.11)
RDW: 14.2 % (ref 11.5–15.5)
WBC Count: 6.6 10*3/uL (ref 4.0–10.5)
nRBC: 0 % (ref 0.0–0.2)

## 2022-06-21 LAB — CMP (CANCER CENTER ONLY)
ALT: 20 U/L (ref 0–44)
AST: 40 U/L (ref 15–41)
Albumin: 4 g/dL (ref 3.5–5.0)
Alkaline Phosphatase: 75 U/L (ref 38–126)
Anion gap: 4 — ABNORMAL LOW (ref 5–15)
BUN: 40 mg/dL — ABNORMAL HIGH (ref 8–23)
CO2: 30 mmol/L (ref 22–32)
Calcium: 10.6 mg/dL — ABNORMAL HIGH (ref 8.9–10.3)
Chloride: 104 mmol/L (ref 98–111)
Creatinine: 1.38 mg/dL — ABNORMAL HIGH (ref 0.44–1.00)
GFR, Estimated: 41 mL/min — ABNORMAL LOW (ref >60)
Glucose, Bld: 190 mg/dL — ABNORMAL HIGH (ref 70–99)
Potassium: 4.5 mmol/L (ref 3.5–5.1)
Sodium: 138 mmol/L (ref 135–145)
Total Bilirubin: 0.5 mg/dL (ref 0.3–1.2)
Total Protein: 7.5 g/dL (ref 6.5–8.1)

## 2022-06-21 MED ORDER — LANREOTIDE ACETATE 120 MG/0.5ML ~~LOC~~ SOLN
120.0000 mg | Freq: Once | SUBCUTANEOUS | Status: AC
  Administered 2022-06-21: 120 mg via SUBCUTANEOUS
  Filled 2022-06-21: qty 120

## 2022-06-21 NOTE — Progress Notes (Signed)
Nutrition Assessment   Reason for Assessment: Taste changes    ASSESSMENT: 72 year old female with neuroendocrine tumor. She is receiving lanreotide injection q28d (start 10/24). Patient under the car of Dr. Lorenso Courier   Past medical history includes HTN, CAD, chronic diarrhea, GERD, hypothyroidism, DM2, diabetic retinopathy, OA of hip, closed fractur of right proximal humerus, CKD3, HLD, gout, IDA, anxiety and depression.  Met with patient and husband in clinic. Patient reports recent fall resulting in fracture of right metacarpal bone. She is in a cast at visit. Patient reports altered taste has resolved. She reports metallic/salty taste lasting ~one week after lanreotide injection. Patient says her mouth taste bad all the time. She keeps mints with her. Patient is providing oral care once a day. She is drinking 64-80 ounces of water. Patient recalls eating when hungry. There are no specific meal plans. Husband usually prepares food. Patient enjoys honey crisp apples, salads, and ice cream. Patient reports drinking Ensure if she has a busy day with appointments. She denies nausea, vomiting, diarrhea, constipation. She reports 20 mg Lasix for new lower extremity swelling. Patient is followed by PCP for this. Patient started farxiga for DM2 ~3 weeks ago. She says this is working well.    Nutrition Focused Physical Exam: deferred    Medications: zyloprim, tums, vit D, celexa, klonopin, farxiga, bentyl, ferrous sulfate, lasix 40 mg/day, synthroid, zestril, zofran, protonix, klor-con, crestor, ambien   Labs: Hgb 9.7, glucose 190, BUN 40, Cr 1.38   Anthropometrics: Weights have slowly trended down 13% in the last 13 months. This is insignificant for time frame however concerning given chronic illness and recent falls  Height: 5'8.5" Weight: 162 lb 3.2 oz (3/6) UBW: 186 lb (Feb 2023) BMI: 24.30    NUTRITION DIAGNOSIS: Unintended weight loss related to side effects of therapy as evidenced by       INTERVENTION:  Discussed importance of adequate energy intake to maintain strength/weights  Educated on foods with protein, encouraged protein source with all meals and snacks Educated on balanced meals and snacks to promote stable blood sugar - handout with tips + ideas provided Discussed strategies for altered taste, educated on importance of oral care. Recommend baking soda salt water rinses several times daily - handout with tips + recipe given Recommend drinking one Ensure Plus/equivalent daily to encourage wt maintenance  One complimentary case of Ensure Plus HP provided today  Contact information given  MONITORING, EVALUATION, GOAL: Patient will tolerate increased calories and protein to minimize further weight loss    Next Visit: To be scheduled as needed

## 2022-06-21 NOTE — Patient Instructions (Signed)
Lanreotide Injection What is this medication? LANREOTIDE (lan REE oh tide) treats high levels of growth hormone (acromegaly). It is used when other therapies have not worked well enough or cannot be tolerated. It works by reducing the amount of growth hormone your body makes. This reduces symptoms and the risk of health problems caused by too much growth hormone, such as diabetes and heart disease. It may also be used to treat neuroendocrine tumors, a cancer of the cells that release hormones and other substances in your body. It works by slowing down the release of these substances from the cells. This slows tumor growth. It also decreases the symptoms of carcinoid syndrome, such as flushing or diarrhea. This medicine may be used for other purposes; ask your health care provider or pharmacist if you have questions. COMMON BRAND NAME(S): Somatuline [place] What should I tell my care team before I take this medication? They need to know if you have any of these conditions: Diabetes Gallbladder disease Heart disease Kidney disease Liver disease Thyroid disease An unusual or allergic reaction to lanreotide, other medications, foods, dyes, or preservatives Pregnant or trying to get pregnant Breast-feeding How should I use this medication? This medication is injected under the skin. It is given by your care team in a hospital or clinic setting. Talk to your care team about the use of this medication in children. Special care may be needed. Overdosage: If you think you have taken too much of this medicine contact a poison control center or emergency room at once. NOTE: This medicine is only for you. Do not share this medicine with others. What if I miss a dose? Keep appointments for follow-up doses. It is important not to miss your dose. Call your care team if you are unable to keep an appointment. What may interact with this medication? Bromocriptine Cyclosporine Certain medications for blood  pressure, heart disease, irregular heartbeat Certain medications for diabetes Quinidine Terfenadine This list may not describe all possible interactions. Give your health care provider a list of all the medicines, herbs, non-prescription drugs, or dietary supplements you use. Also tell them if you smoke, drink alcohol, or use illegal drugs. Some items may interact with your medicine. What should I watch for while using this medication? Visit your care team for regular checks on your progress. Tell your care team if your symptoms do not start to get better or if they get worse. Your condition will be monitored carefully while you are receiving this medication. You may need blood work while you are taking this medication. This medication may increase blood sugar. The risk may be higher in patients who already have diabetes. Ask your care team what you can do to lower your risk of diabetes while taking this medication. Talk to your care team if you wish to become pregnant or think you may be pregnant. This medication can cause serious birth defects. Do not breast-feed while taking this medication and for 6 months after stopping therapy. This medication may cause infertility. Talk to your care team if you are concerned about your fertility. What side effects may I notice from receiving this medication? Side effects that you should report to your care team as soon as possible: Allergic reactions--skin rash, itching, hives, swelling of the face, lips, tongue, or throat Gallbladder problems--severe stomach pain, nausea, vomiting, fever High blood sugar (hyperglycemia)--increased thirst or amount of urine, unusual weakness or fatigue, blurry vision Increase in blood pressure Low blood sugar (hypoglycemia)--tremors or shaking, anxiety, sweating, cold   or clammy skin, confusion, dizziness, rapid heartbeat Low thyroid levels (hypothyroidism)--unusual weakness or fatigue, increased sensitivity to cold,  constipation, hair loss, dry skin, weight gain, feelings of depression Slow heartbeat--dizziness, feeling faint or lightheaded, confusion, trouble breathing, unusual weakness or fatigue Side effects that usually do not require medical attention (report to your care team if they continue or are bothersome): Diarrhea Dizziness Headache Muscle spasms Nausea Pain, redness, irritation, or bruising at the injection site Stomach pain This list may not describe all possible side effects. Call your doctor for medical advice about side effects. You may report side effects to FDA at 1-800-FDA-1088. Where should I keep my medication? This medication is given in a hospital or clinic. It will not be stored at home. NOTE: This sheet is a summary. It may not cover all possible information. If you have questions about this medicine, talk to your doctor, pharmacist, or health care provider.  2023 Elsevier/Gold Standard (2021-05-06 00:00:00)  

## 2022-06-22 LAB — BASIC METABOLIC PANEL
BUN/Creatinine Ratio: 25 (ref 12–28)
BUN: 36 mg/dL — ABNORMAL HIGH (ref 8–27)
CO2: 23 mmol/L (ref 20–29)
Calcium: 10.3 mg/dL (ref 8.7–10.3)
Chloride: 104 mmol/L (ref 96–106)
Creatinine, Ser: 1.43 mg/dL — ABNORMAL HIGH (ref 0.57–1.00)
Glucose: 224 mg/dL — ABNORMAL HIGH (ref 70–99)
Potassium: 5 mmol/L (ref 3.5–5.2)
Sodium: 140 mmol/L (ref 134–144)
eGFR: 39 mL/min/{1.73_m2} — ABNORMAL LOW (ref >59)

## 2022-06-22 LAB — CHROMOGRANIN A: Chromogranin A (ng/mL): 137.5 ng/mL — ABNORMAL HIGH (ref 0.0–101.8)

## 2022-06-26 ENCOUNTER — Encounter: Payer: Self-pay | Admitting: Nurse Practitioner

## 2022-06-26 ENCOUNTER — Other Ambulatory Visit: Payer: Self-pay | Admitting: Nurse Practitioner

## 2022-06-26 ENCOUNTER — Ambulatory Visit: Payer: Medicare Other | Attending: Nurse Practitioner | Admitting: Nurse Practitioner

## 2022-06-26 ENCOUNTER — Ambulatory Visit: Payer: Medicare Other | Attending: Nurse Practitioner

## 2022-06-26 ENCOUNTER — Telehealth: Payer: Self-pay | Admitting: Nurse Practitioner

## 2022-06-26 VITALS — BP 114/50 | HR 49 | Ht 68.5 in | Wt 166.2 lb

## 2022-06-26 DIAGNOSIS — R5383 Other fatigue: Secondary | ICD-10-CM | POA: Diagnosis not present

## 2022-06-26 DIAGNOSIS — C7A8 Other malignant neuroendocrine tumors: Secondary | ICD-10-CM

## 2022-06-26 DIAGNOSIS — I10 Essential (primary) hypertension: Secondary | ICD-10-CM

## 2022-06-26 DIAGNOSIS — I350 Nonrheumatic aortic (valve) stenosis: Secondary | ICD-10-CM | POA: Diagnosis not present

## 2022-06-26 DIAGNOSIS — R001 Bradycardia, unspecified: Secondary | ICD-10-CM | POA: Diagnosis not present

## 2022-06-26 DIAGNOSIS — I2510 Atherosclerotic heart disease of native coronary artery without angina pectoris: Secondary | ICD-10-CM

## 2022-06-26 DIAGNOSIS — I6523 Occlusion and stenosis of bilateral carotid arteries: Secondary | ICD-10-CM

## 2022-06-26 DIAGNOSIS — R9389 Abnormal findings on diagnostic imaging of other specified body structures: Secondary | ICD-10-CM | POA: Diagnosis not present

## 2022-06-26 DIAGNOSIS — I38 Endocarditis, valve unspecified: Secondary | ICD-10-CM | POA: Diagnosis not present

## 2022-06-26 DIAGNOSIS — I059 Rheumatic mitral valve disease, unspecified: Secondary | ICD-10-CM | POA: Diagnosis not present

## 2022-06-26 NOTE — Progress Notes (Addendum)
Cardiology Office Note:    Date:  06/26/2022  ID:  Sabrina, Deleon 02-08-1951, MRN [id number]  PCP:  Babs Sciara, MD   [facility] Providers Cardiologist:  Nona Dell, MD     Referring MD: Babs Sciara, MD   CC: Here for follow-up  History of Present Illness:    Sabrina Deleon is a very delightful 72 y.o. female with a hx of the following:  Mild, nonobstructive CAD Severe aortic stenosis Mitral valve disease Hypertension Hypothyroidism Type 2 diabetes Chronic kidney disease Carotid artery disease Obesity Neuroendocrine tumor (Dx 2023 - seeing Oncology)  Last seen by Dr. Diona Browner on November 07, 2021.  She was doing well at the time. TTE revealed normal EF, moderate diastolic dysfunction, moderate MR and moderate mitral stenosis with mean gradient at 8 mmHg, severe AS with mean gradient increased to 57 mmHg.  Patient verbalized hesitancy to pursue surgery.  However she was agreeable to consider further discussion with Dr. Laneta Simmers.  She underwent CT angiogram C/A/P prior to consideration for TAVR for severe aortic aortic stenosis, noted to have bulky central mesenteric soft tissue mass, increased in size since 2019.  New and widespread mild to moderate retroperitoneal, mediastinal, retrocrural, and bilateral hilar lymphadenopathy also noted.  Diagnosed with differentiated neuroendocrine tumor, seeing Dr. Leonides Schanz.   Patient canceled appointment with Dr. Laneta Simmers in October 2023.  Denied any acute cardiac concerns or issues.  Patient requested to wait until first of the year to start thinking about potential aortic valve replacement.   At last office visit with me, was doing well from a cardiac perspective. Noted getting injections for the neuroendocrine tumor, overall tolerating well. Declined referral to see Dr. Laneta Simmers.   Today she presents for follow-up.  Since last visit, she had a near mechanical fall, fractured her fifth metacarpal bone of right hand, currently  in a cast.  Overall doing well from a cardiac perspective.  She just notes some fatigue. Denies any chest pain, shortness of breath, palpitations, syncope, presyncope, dizziness, orthopnea, PND, swelling or significant weight changes, acute bleeding, or claudication.  Continues to decline referral to structural heart team.  SH: In her free time, she enjoys spending time with her grandchildren. She plays the piano and sings in her church's choir.   Past Medical History:  Diagnosis Date   Anxiety    Aortic stenosis    Arthritis    Chronic kidney disease    Dr. Salem Caster- noted increase kidney function levels- took pt off Metformin and started on Onglyza for Blood sugar control-pt being followed for this.   GERD (gastroesophageal reflux disease)    Gout    Hypertension    Hypothyroidism    Morbid obesity 03/29/2018   Patient has elevated BMI with diabetes hyperlipidemia and hypertension   Pancreatitis    Type 2 diabetes mellitus    Wears glasses    Wears partial dentures    Upper    Past Surgical History:  Procedure Laterality Date   BACK SURGERY     Lumbar   CARPAL TUNNEL RELEASE  11/02/2011   Procedure: CARPAL TUNNEL RELEASE;  Surgeon: Tami Ribas, MD;  Location: [facility];  Service: Orthopedics;  Laterality: Right;   CARPAL TUNNEL RELEASE Left 01/14/2015   Procedure: LEFT CARPAL TUNNEL RELEASE;  Surgeon: Betha Loa, MD;  Location:  SURGERY CENTER;  Service: Orthopedics;  Laterality: Left;   CHOLECYSTECTOMY     laparoscopic   COLONOSCOPY  COLONOSCOPY N/A 11/12/2015   Procedure: COLONOSCOPY;  Surgeon: Malissa Hippo, MD;  Location: [facility];  Service: Endoscopy;  Laterality: N/A;  9:30   ESOPHAGOGASTRODUODENOSCOPY N/A 11/12/2015   Procedure: ESOPHAGOGASTRODUODENOSCOPY (EGD);  Surgeon: Malissa Hippo, MD;  Location: [facility];  Service: Endoscopy;  Laterality: N/A;   REVERSE SHOULDER ARTHROPLASTY Right 11/04/2018   REVERSE SHOULDER  ARTHROPLASTY Right 11/04/2018   Procedure: REVERSE SHOULDER ARTHROPLASTY;  Surgeon: Ernest Mallick, MD;  Location: [facility];  Service: Orthopedics;  Laterality: Right;   RIGHT/LEFT HEART CATH AND CORONARY ANGIOGRAPHY N/A 10/18/2017   Procedure: RIGHT/LEFT HEART CATH AND CORONARY ANGIOGRAPHY;  Surgeon: Kathleene Hazel, MD;  Location: [facility];  Service: Cardiovascular;  Laterality: N/A;   TOTAL HIP ARTHROPLASTY Left 03/22/2016   Procedure: LEFT TOTAL HIP ARTHROPLASTY ANTERIOR APPROACH;  Surgeon: Ollen Gross, MD;  Location: [facility];  Service: Orthopedics;  Laterality: Left;    Current Medications: Current Meds  Medication Sig   allopurinol (ZYLOPRIM) 100 MG tablet TAKE 1 TABLET(100 MG) BY MOUTH TWICE DAILY   aspirin EC 81 MG tablet Take 81 mg by mouth at bedtime.   calcium carbonate (TUMS - DOSED IN MG ELEMENTAL CALCIUM) 500 MG chewable tablet Chew 1-2 tablets by mouth daily as needed for indigestion or heartburn.   Cholecalciferol (VITAMIN D) 50 MCG (2000 UT) tablet Take 2,000 Units by mouth daily.   citalopram (CELEXA) 20 MG tablet Take 1 tablet (20 mg total) by mouth daily.   clonazePAM (KLONOPIN) 0.5 MG tablet 1 nightly as needed insomnia   dapagliflozin propanediol (FARXIGA) 5 MG TABS tablet Take 1 tablet (5 mg total) by mouth daily before breakfast.   dicyclomine (BENTYL) 10 MG capsule Take 1 capsule (10 mg total) by mouth 3 (three) times daily before meals.   ferrous sulfate 325 (65 FE) MG tablet Take 325 mg by mouth daily with breakfast.   furosemide (LASIX) 40 MG tablet TAKE 1 TABLET(40 MG) BY MOUTH DAILY   ketoconazole (NIZORAL) 2 % cream Apply 1 Application topically daily as needed (fungus).   levothyroxine (SYNTHROID) 125 MCG tablet TAKE 1 TABLET(125 MCG) BY MOUTH DAILY BEFORE BREAKFAST   MISC NATURAL PRODUCTS PO Take 1 tablet by mouth daily. Super Beets otc supplement   ondansetron (ZOFRAN) 8 MG tablet Take 1 tablet (8 mg total) by mouth every 8 (eight) hours as  needed.   ONETOUCH ULTRA test strip TEST ONCE DAILY   pantoprazole (PROTONIX) 40 MG tablet TAKE 1 TABLET(40 MG) BY MOUTH DAILY (Patient taking differently: Take 40 mg by mouth daily.)   potassium chloride (KLOR-CON M) 10 MEQ tablet TAKE 1 TABLET BY MOUTH EVERY DAY ON MONDAY, WEDNESDAY AND FRIDAY   rosuvastatin (CRESTOR) 10 MG tablet TAKE 1 TABLET(10 MG) BY MOUTH DAILY FOR CHOLESTEROL. STOP WHILE TAKING COLCHICINE FOR GOUT   zolpidem (AMBIEN) 5 MG tablet Take 1 tablet (5 mg total) by mouth at bedtime.   lisinopril (ZESTRIL) 20 MG tablet Take 10 mg by mouth daily.     Allergies:   Bee venom   Social History   Socioeconomic History   Marital status: Married    Spouse name: Not on file   Number of children: Not on file   Years of education: Not on file   Highest education level: Not on file  Occupational History   Not on file  Tobacco Use   Smoking status: Former    Types: Cigarettes    Quit date: 10/29/1981    Years  since quitting: 40.6   Smokeless tobacco: Never  Vaping Use   Vaping Use: Never used  Substance and Sexual Activity   Alcohol use: Yes    Comment: occasionally   Drug use: No   Sexual activity: Not Currently  Other Topics Concern   Not on file  Social History Narrative   Not on file   Social Determinants of Health   Financial Resource Strain: Not on file  Food Insecurity: Not on file  Transportation Needs: Not on file  Physical Activity: Not on file  Stress: Not on file  Social Connections: Not on file     Family History: The patient's family history includes Breast cancer in her mother and paternal grandmother; Head & neck cancer in her nephew; Lung cancer in her mother.  ROS:     Please see the history of present illness.    All other systems reviewed and are negative.  EKGs/Labs/Other Studies Reviewed:    The following studies were reviewed today:   EKG:  EKG is ordered today and reveals sinus bradycardia, 49 bpm, no acute ischemic changes..     Cardiac TAVR CT on 11/29/2021: IMPRESSION: 1. Tricuspid aortic valve with severely reduced cusp excursion. Severely thickened and severely calcified aortic valve cusps.   2.  Aortic valve calcium score: 3761   3. Annulus area: 540 mm2, suitable for 29 mm Sapien 3 valve. LVOT calcifications below commissure of non- and left coronary cusps. Recommend Heart Team discussion for valve sizing.   4.  Sufficient coronary artery heights from annulus.   5.  Optimum fluoroscopic angle for delivery: LAO 8 CAU 7    IMPRESSION: 1. Bulky central mesenteric soft tissue mass, increased since 2019 CT. New widespread mild to moderate retroperitoneal, retrocrural, mediastinal and bilateral hilar lymphadenopathy. Findings are most suggestive of lymphoma, with metastatic disease from an unknown primary not excluded. Oncology consultation and PET-CT suggested at this time. 2. Vascular findings and measurements pertinent to potential TAVR procedure, as detailed. 3. Diffusely thickened and coarsely calcified aortic valve, compatible with the reported history of severe aortic stenosis. 4. Dilated main pulmonary artery, suggesting chronic pulmonary arterial hypertension. 5. Scattered tiny solid right pulmonary nodules, largest 0.3 cm, not definitely seen on prior chest CT. Suggest attention on follow-up noncontrast chest CT in 3-6 months. 6. Indeterminate 1.5 cm hyperdense lateral upper left renal cortical lesion, mildly increased in size since 02/01/2018 CT. MRI (preferred) or CT abdomen without and with IV contrast is indicated for further characterization to exclude renal neoplasm. 7. Aortic Atherosclerosis (ICD10-I70.0).   Echocardiogram on 11/04/2021:  1. Left ventricular ejection fraction, by estimation, is 60 to 65%. The  left ventricle has normal function. The left ventricle has no regional  wall motion abnormalities. There is mild left ventricular hypertrophy.  Left ventricular diastolic  parameters  are consistent with Grade II diastolic dysfunction (pseudonormalization).  Elevated left atrial pressure. The average left ventricular global  longitudinal strain is -19.6 %. The global longitudinal strain is normal.   2. Right ventricular systolic function is normal. The right ventricular  size is normal. Tricuspid regurgitation signal is inadequate for assessing  PA pressure.   3. Left atrial size was moderately dilated.   4. The mitral valve is abnormal. Moderate mitral valve regurgitation.  Moderate mitral stenosis. MV peak gradient, 20.8 mmHg. The mean mitral  valve gradient is 8.0 mmHg.      Moderate mitral annular calcification.   5. The aortic valve has an indeterminant number of cusps. There  is severe  calcifcation of the aortic valve. There is severe thickening of the aortic  valve. Aortic valve regurgitation is moderate. Severe aortic valve  stenosis. . Severe aortic stenosis is   present. Aortic valve mean gradient measures 57.0 mmHg. Aortic valve peak  gradient measures 90.5 mmHg. Aortic valve area, by VTI measures 0.71 cm.   6. The inferior vena cava is normal in size with greater than 50%  respiratory variability, suggesting right atrial pressure of 3 mmHg.   Comparison(s): Echocardiogram done 05/10/21 showed an EF of 55-60% with  severe AS and an AV Mean Grad of 44 mmHg.  Carotid doppler on 02/01/2018: Right Carotid: Velocities in the right ICA are consistent with a 40-59%                 stenosis. A narrowing segment at proximal to mid, velocity                 elevated.   Left Carotid: Velocities in the left ICA are consistent with a 1-39%  stenosis.   Vertebrals: Bilateral vertebral arteries demonstrate antegrade flow.   Right and left heart cath on 10/18/2017: Prox LAD lesion is 20% stenosed. Prox RCA to Mid RCA lesion is 20% stenosed. Hemodynamic findings consistent with mild pulmonary hypertension.   1. Mild non-obstructive CAD 2. Severe aortic  stenosis with mean gradient of 26 mmHg, peak gradient 34 mmHg. (By echo the mean gradient is 44mmHg and the leaflets are thickened with poor mobility).    Recommendations: She will need AVR. Her risk for surgical AVR seems to be low. I will make a referral to see Dr. Laneta SimmersBartle or Dr. Cornelius Moraswen in the CT surgery office to evaluate for surgical AVR. We will discuss her case at our valve team meeting as there is a possibility of TAVR. Findings relayed to Dr. Diona BrownerMcDowell.   Recent Labs: 05/11/2022: TSH 0.498 06/21/2022: ALT 20; BUN 36; Creatinine, Ser 1.43; Hemoglobin 9.7; Platelet Count 179; Potassium 5.0; Sodium 140  Recent Lipid Panel    Component Value Date/Time   CHOL 119 05/11/2022 0853   TRIG 78 05/11/2022 0853   HDL 51 05/11/2022 0853   CHOLHDL 2.3 05/11/2022 0853   LDLCALC 52 05/11/2022 0853        Physical Exam:    VS:  BP (!) 114/50   Pulse (!) 49   Ht 5' 8.5" (1.74 m)   Wt 166 lb 3.2 oz (75.4 kg)   SpO2 99%   BMI 24.90 kg/m     Wt Readings from Last 3 Encounters:  06/26/22 166 lb 3.2 oz (75.4 kg)  05/24/22 162 lb 3.2 oz (73.6 kg)  05/10/22 160 lb 12.8 oz (72.9 kg)     GEN: Well nourished, well developed in no acute distress HEENT: Normal NECK: No JVD; No carotid bruits CARDIAC: S1/S2, RRR, Grade 3/6 murmur, no rubs, no gallops; 2+ pulses RESPIRATORY:  Clear to auscultation without rales, wheezing or rhonchi  MUSCULOSKELETAL:  No edema; No deformity  SKIN: Warm and dry NEUROLOGIC:  Alert and oriented x 3 PSYCHIATRIC:  Normal affect   ASSESSMENT:    1. Severe aortic stenosis   2. Mitral valve disease   3. Valvular insufficiency   4. Coronary artery disease involving native heart without angina pectoris, unspecified vessel or lesion type   5. Bilateral carotid artery stenosis   6. Essential hypertension, benign   7. Other fatigue   8. Bradycardia   9. Neuroendocrine cancer   10. Abnormal CT scan  PLAN:    In order of problems listed above:  Severe aortic  stenosis, mitral valve disease, valvular insufficiency TTE 10/2021 revealed function, preserved EF, grade 2 DD, moderate MR with moderate mitral stenosis with mean mitral valve gradient 8.0 mmHg.  Severe aortic stenosis with moderate aortic valve regurgitation, mean gradient measured 57.0 mmHg with peak gradient measuring 90.5 mmHg. Previous AV mean gradient 04/2021 was 44 mmHG. Cardiac TAVR CT 11/2021 revealed severely thickened and severely calcified aortic valve, aortic valve calcium score 3761.  It was recommended for heart team discussion for valve sizing.  Continues to decline referral to structural heart. Discussed risks , goals of care, and recommended palliative care referral, verbalized understanding, and politely declines referral.  Will discontinue lisinopril.  Continue rest of medication regimen.. ED precautions discussed. Heart healthy diet and regular cardiovascular exercise encouraged.   Mild, nonobstructive CAD, Carotid artery stenosis Stable with no anginal symptoms. No indication for ischemic evaluation.  Continue aspirin, lisinopril, and rosuvastatin. Heart healthy diet encouraged. Carotid doppler in 2019 showed R ICA at 40-59% stenosis, L ICA stenosis 1-39%. Continue rosuvastatin.   HTN BP today 114/50. BP well controlled at home.  Discontinuing lisinopril as mentioned above.  Continue current medication regimen. Currntly not on any beta blockers or CCB.  Heart healthy diet recommended.  Encouraged adequate hydration.  4.  Fatigue, bradycardia Patient notes fatigue.  Heart rate on exam found to be 48 bpm.  Not on any beta-blocker or heart rate lowering medications currently.  Will arrange 7-day ZIO monitor to evaluate heart rhythm, heart rate, and any arrhythmias.  Patient defers lab work as she has is regularly checked at cancer center.  ED precautions discussed. Heart healthy diet and regular cardiovascular exercise encouraged.    5. Neuroendocrine tumor, abnormal CT scan  Receiving  treatment. Continue to follow-up with oncology. CT scan 11/2021 showed right pulmonary nodules, largest 0.3 cm. Was suggested attention follow-up with noncontrast chest CT in 3-6 months. Indeterminate 1.5 cm hyperdense lateral upper left renal cortical Lesion noted, mildly increased in size since 02/01/2018 CT. MRI (preferred) or CT abdomen without and with IV contrast indicated for further evaluation to exclude renal neoplasm. Will defer to PCP and Oncology.    6. Disposition: Follow-up with me or APP in 2-3 months or sooner if anything changes.   Medication Adjustments/Labs and Tests Ordered: Current medicines are reviewed at length with the patient today.  Concerns regarding medicines are outlined above.  Orders Placed This Encounter  Procedures   EKG 12-Lead    No orders of the defined types were placed in this encounter.   Patient Instructions  Medication Instructions:  Your physician has recommended you make the following change in your medication:  STOP lisinopril Continue all other medications as directed  Labwork: none  Testing/Procedures: ZIO- Long Term Monitor Instructions   Your physician has requested you wear your ZIO patch monitor 7 days.   This is a single patch monitor.  [employer] supplies one patch monitor per enrollment.  Additional stickers are not available.   Please do not apply patch if you will be having a Nuclear Stress Test, Echocardiogram, Cardiac CT, MRI, or Chest Xray during the time frame you would be wearing the monitor. The patch cannot be worn during these tests.  You cannot remove and re-apply the ZIO XT patch monitor.   Do not shower for the first 24 hours.  You may shower after the first 24 hours.   Press button if you feel a symptom.  You will hear a small click.  Record Date, Time and Symptom in the Patient Log Book.   When you are ready to remove patch, follow instructions on last 2 pages of Patient Log Book.  Stick patch monitor onto last page  of Patient Log Book.   Place Patient Log Book in [place] box.  Use locking tab on box and tape box closed securely.  The [employer] and [employer] has [employer] on it.  Please place in mailbox as soon as possible.  Your physician should have your test results approximately 7 days after the monitor has been mailed back to [facility].   Call [facility] Customer Care at [phone] if you have questions regarding your ZIO XT patch monitor.  Call them immediately if you see an orange light blinking on your monitor.   If your monitor falls off in less than 4 days contact our Monitor department at [phone].  If your monitor becomes loose or falls off after 4 days call [employer] at [phone] for suggestions on securing your monitor.   Follow-Up:  Your physician recommends that you schedule a follow-up appointment in: 3-4 months  Any Other Special Instructions Will Be Listed Below (If Applicable).  If you need a refill on your cardiac medications before your next appointment, please call your pharmacy.     Signed, Sharlene Dory, NP  06/26/2022 1:33 PM    [facility]

## 2022-06-26 NOTE — Telephone Encounter (Signed)
7 day Zio XT placed and enrolled 4/8

## 2022-06-26 NOTE — Patient Instructions (Signed)
Medication Instructions:  Your physician has recommended you make the following change in your medication:  STOP lisinopril Continue all other medications as directed  Labwork: none  Testing/Procedures: ZIO- Long Term Monitor Instructions   Your physician has requested you wear your ZIO patch monitor 7 days.   This is a single patch monitor.  [employer] supplies one patch monitor per enrollment.  Additional stickers are not available.   Please do not apply patch if you will be having a Nuclear Stress Test, Echocardiogram, Cardiac CT, MRI, or Chest Xray during the time frame you would be wearing the monitor. The patch cannot be worn during these tests.  You cannot remove and re-apply the ZIO XT patch monitor.   Do not shower for the first 24 hours.  You may shower after the first 24 hours.   Press button if you feel a symptom. You will hear a small click.  Record Date, Time and Symptom in the Patient Log Book.   When you are ready to remove patch, follow instructions on last 2 pages of Patient Log Book.  Stick patch monitor onto last page of Patient Log Book.   Place Patient Log Book in [place] box.  Use locking tab on box and tape box closed securely.  The [employer] and [employer] has [employer] on it.  Please place in mailbox as soon as possible.  Your physician should have your test results approximately 7 days after the monitor has been mailed back to [facility].   Call [facility] Customer Care at [phone] if you have questions regarding your ZIO XT patch monitor.  Call them immediately if you see an orange light blinking on your monitor.   If your monitor falls off in less than 4 days contact our Monitor department at [phone].  If your monitor becomes loose or falls off after 4 days call [employer] at [phone] for suggestions on securing your monitor.   Follow-Up:  Your physician recommends that you schedule a follow-up appointment in: 3-4 months  Any Other  Special Instructions Will Be Listed Below (If Applicable).  If you need a refill on your cardiac medications before your next appointment, please call your pharmacy.

## 2022-07-02 ENCOUNTER — Telehealth: Payer: Self-pay | Admitting: Family Medicine

## 2022-07-02 NOTE — Telephone Encounter (Signed)
Patient send her glucose readings for review.  Majority of these are still running high  Will connect with patient

## 2022-07-04 NOTE — Telephone Encounter (Signed)
I reviewed over her readings Most mornings 170-low 200s and evenings 170-200plus  Unlikely that increasing farxiga will bring numbers down.  We can add a pill medicine called Januvia Or other option is heading into using long actimg insulin She can give [place] feedback? So we can proceed She does have follow up visit early May please keep

## 2022-07-07 DIAGNOSIS — R001 Bradycardia, unspecified: Secondary | ICD-10-CM | POA: Diagnosis not present

## 2022-07-07 NOTE — Telephone Encounter (Signed)
Verified and patient would like to try Januvia. Prefers to avoid insulin if possible.

## 2022-07-10 NOTE — Telephone Encounter (Signed)
Recommend adding Januvia 50 mg 1 daily, #30, 4 additional refills patient to keep her follow-up visit in May

## 2022-07-11 ENCOUNTER — Other Ambulatory Visit: Payer: Self-pay

## 2022-07-11 ENCOUNTER — Other Ambulatory Visit: Payer: Self-pay | Admitting: Family Medicine

## 2022-07-11 DIAGNOSIS — E1122 Type 2 diabetes mellitus with diabetic chronic kidney disease: Secondary | ICD-10-CM

## 2022-07-11 MED ORDER — SITAGLIPTIN PHOSPHATE 50 MG PO TABS
50.0000 mg | ORAL_TABLET | Freq: Every day | ORAL | 4 refills | Status: DC
Start: 2022-07-11 — End: 2022-12-13

## 2022-07-11 NOTE — Telephone Encounter (Signed)
Patient made aware of drs notes.

## 2022-07-13 ENCOUNTER — Other Ambulatory Visit (INDEPENDENT_AMBULATORY_CARE_PROVIDER_SITE_OTHER): Payer: Medicare Other

## 2022-07-13 ENCOUNTER — Ambulatory Visit (INDEPENDENT_AMBULATORY_CARE_PROVIDER_SITE_OTHER): Payer: Medicare Other | Admitting: Orthopedic Surgery

## 2022-07-13 DIAGNOSIS — S62346A Nondisplaced fracture of base of fifth metacarpal bone, right hand, initial encounter for closed fracture: Secondary | ICD-10-CM

## 2022-07-13 DIAGNOSIS — S62346D Nondisplaced fracture of base of fifth metacarpal bone, right hand, subsequent encounter for fracture with routine healing: Secondary | ICD-10-CM

## 2022-07-13 NOTE — Progress Notes (Signed)
   This is a follow-up appointment  Encounter Diagnosis  Name Primary?   Closed nondisplaced fracture of base of fifth metacarpal bone of right hand with routine healing, subsequent encounter 06/10/22 Yes     Chief Complaint  Patient presents with   Hand Problem    06/10/22 date of injury Right hand fracture     Cast off imaging and exam today  Patient feels good in terms of her hand function.  She had a little trouble moving her small finger playing the piano at church  The x-rays show that the fracture is in good position  Clinical exam reveals no tenderness or deformity  I released her to normal activity

## 2022-07-19 ENCOUNTER — Other Ambulatory Visit: Payer: Self-pay

## 2022-07-19 ENCOUNTER — Inpatient Hospital Stay: Payer: Medicare Other

## 2022-07-19 ENCOUNTER — Inpatient Hospital Stay: Payer: Medicare Other | Attending: Physician Assistant

## 2022-07-19 VITALS — BP 143/58 | HR 57 | Temp 98.1°F | Resp 16

## 2022-07-19 DIAGNOSIS — Z7984 Long term (current) use of oral hypoglycemic drugs: Secondary | ICD-10-CM | POA: Insufficient documentation

## 2022-07-19 DIAGNOSIS — C7A8 Other malignant neuroendocrine tumors: Secondary | ICD-10-CM

## 2022-07-19 DIAGNOSIS — Z79899 Other long term (current) drug therapy: Secondary | ICD-10-CM | POA: Diagnosis not present

## 2022-07-19 DIAGNOSIS — C7A00 Malignant carcinoid tumor of unspecified site: Secondary | ICD-10-CM | POA: Diagnosis not present

## 2022-07-19 DIAGNOSIS — E1165 Type 2 diabetes mellitus with hyperglycemia: Secondary | ICD-10-CM | POA: Insufficient documentation

## 2022-07-19 DIAGNOSIS — C7B01 Secondary carcinoid tumors of distant lymph nodes: Secondary | ICD-10-CM | POA: Diagnosis not present

## 2022-07-19 LAB — CBC WITH DIFFERENTIAL (CANCER CENTER ONLY)
Abs Immature Granulocytes: 0.02 10*3/uL (ref 0.00–0.07)
Basophils Absolute: 0.1 10*3/uL (ref 0.0–0.1)
Basophils Relative: 1 %
Eosinophils Absolute: 0.3 10*3/uL (ref 0.0–0.5)
Eosinophils Relative: 5 %
HCT: 32.7 % — ABNORMAL LOW (ref 36.0–46.0)
Hemoglobin: 10.4 g/dL — ABNORMAL LOW (ref 12.0–15.0)
Immature Granulocytes: 0 %
Lymphocytes Relative: 19 %
Lymphs Abs: 1.2 10*3/uL (ref 0.7–4.0)
MCH: 29.8 pg (ref 26.0–34.0)
MCHC: 31.8 g/dL (ref 30.0–36.0)
MCV: 93.7 fL (ref 80.0–100.0)
Monocytes Absolute: 0.4 10*3/uL (ref 0.1–1.0)
Monocytes Relative: 7 %
Neutro Abs: 4.2 10*3/uL (ref 1.7–7.7)
Neutrophils Relative %: 68 %
Platelet Count: 147 10*3/uL — ABNORMAL LOW (ref 150–400)
RBC: 3.49 MIL/uL — ABNORMAL LOW (ref 3.87–5.11)
RDW: 14.2 % (ref 11.5–15.5)
WBC Count: 6.1 10*3/uL (ref 4.0–10.5)
nRBC: 0 % (ref 0.0–0.2)

## 2022-07-19 LAB — CMP (CANCER CENTER ONLY)
ALT: 43 U/L (ref 0–44)
AST: 72 U/L — ABNORMAL HIGH (ref 15–41)
Albumin: 4.4 g/dL (ref 3.5–5.0)
Alkaline Phosphatase: 73 U/L (ref 38–126)
Anion gap: 4 — ABNORMAL LOW (ref 5–15)
BUN: 26 mg/dL — ABNORMAL HIGH (ref 8–23)
CO2: 33 mmol/L — ABNORMAL HIGH (ref 22–32)
Calcium: 10.3 mg/dL (ref 8.9–10.3)
Chloride: 100 mmol/L (ref 98–111)
Creatinine: 1.21 mg/dL — ABNORMAL HIGH (ref 0.44–1.00)
GFR, Estimated: 48 mL/min — ABNORMAL LOW (ref >60)
Glucose, Bld: 184 mg/dL — ABNORMAL HIGH (ref 70–99)
Potassium: 4 mmol/L (ref 3.5–5.1)
Sodium: 137 mmol/L (ref 135–145)
Total Bilirubin: 0.8 mg/dL (ref 0.3–1.2)
Total Protein: 7.4 g/dL (ref 6.5–8.1)

## 2022-07-19 MED ORDER — LANREOTIDE ACETATE 120 MG/0.5ML ~~LOC~~ SOLN
120.0000 mg | Freq: Once | SUBCUTANEOUS | Status: AC
  Administered 2022-07-19: 120 mg via SUBCUTANEOUS
  Filled 2022-07-19: qty 120

## 2022-07-21 LAB — CHROMOGRANIN A: Chromogranin A (ng/mL): 120.3 ng/mL — ABNORMAL HIGH (ref 0.0–101.8)

## 2022-07-27 ENCOUNTER — Other Ambulatory Visit: Payer: Self-pay | Admitting: Family Medicine

## 2022-07-27 ENCOUNTER — Ambulatory Visit (INDEPENDENT_AMBULATORY_CARE_PROVIDER_SITE_OTHER): Payer: Medicare Other | Admitting: Family Medicine

## 2022-07-27 VITALS — BP 120/52 | HR 47 | Ht 68.5 in | Wt 164.6 lb

## 2022-07-27 DIAGNOSIS — E039 Hypothyroidism, unspecified: Secondary | ICD-10-CM

## 2022-07-27 DIAGNOSIS — D649 Anemia, unspecified: Secondary | ICD-10-CM | POA: Diagnosis not present

## 2022-07-27 DIAGNOSIS — D696 Thrombocytopenia, unspecified: Secondary | ICD-10-CM | POA: Diagnosis not present

## 2022-07-27 DIAGNOSIS — E1122 Type 2 diabetes mellitus with diabetic chronic kidney disease: Secondary | ICD-10-CM | POA: Diagnosis not present

## 2022-07-27 DIAGNOSIS — Z7984 Long term (current) use of oral hypoglycemic drugs: Secondary | ICD-10-CM

## 2022-07-27 DIAGNOSIS — R7989 Other specified abnormal findings of blood chemistry: Secondary | ICD-10-CM | POA: Diagnosis not present

## 2022-07-27 DIAGNOSIS — F5101 Primary insomnia: Secondary | ICD-10-CM | POA: Diagnosis not present

## 2022-07-27 DIAGNOSIS — N1832 Chronic kidney disease, stage 3b: Secondary | ICD-10-CM

## 2022-07-27 MED ORDER — KETOCONAZOLE 2 % EX CREA: 1.0000 | TOPICAL_CREAM | Freq: Every day | CUTANEOUS | 0 refills | Status: AC | PRN

## 2022-07-27 MED ORDER — CLONAZEPAM 0.5 MG PO TABS: ORAL_TABLET | ORAL | 5 refills | Status: DC

## 2022-07-27 NOTE — Progress Notes (Signed)
Subjective:    Patient ID: Sabrina Deleon, female    DOB: Sep 25, 1950, 72 y.o.   MRN: [id number]  HPI Patient arrives today for 4 month follow up.  Outpatient Encounter Medications as of 07/27/2022  Medication Sig   allopurinol (ZYLOPRIM) 100 MG tablet TAKE 1 TABLET(100 MG) BY MOUTH TWICE DAILY   aspirin EC 81 MG tablet Take 81 mg by mouth at bedtime.   Cholecalciferol (VITAMIN D) 50 MCG (2000 UT) tablet Take 2,000 Units by mouth daily.   citalopram (CELEXA) 20 MG tablet Take 1 tablet (20 mg total) by mouth daily.   dicyclomine (BENTYL) 10 MG capsule Take 1 capsule (10 mg total) by mouth 3 (three) times daily before meals.   ferrous sulfate 325 (65 FE) MG tablet Take 325 mg by mouth daily with breakfast.   furosemide (LASIX) 40 MG tablet TAKE 1 TABLET(40 MG) BY MOUTH DAILY   levothyroxine (SYNTHROID) 125 MCG tablet TAKE 1 TABLET(125 MCG) BY MOUTH DAILY BEFORE BREAKFAST   ondansetron (ZOFRAN) 8 MG tablet Take 1 tablet (8 mg total) by mouth every 8 (eight) hours as needed.   pantoprazole (PROTONIX) 40 MG tablet TAKE 1 TABLET(40 MG) BY MOUTH DAILY (Patient taking differently: Take 40 mg by mouth daily.)   potassium chloride (KLOR-CON M) 10 MEQ tablet TAKE 1 TABLET BY MOUTH EVERY DAY ON MONDAY, WEDNESDAY AND FRIDAY   rosuvastatin (CRESTOR) 10 MG tablet TAKE 1 TABLET(10 MG) BY MOUTH DAILY FOR CHOLESTEROL. STOP WHILE TAKING COLCHICINE FOR GOUT   sitaGLIPtin (JANUVIA) 50 MG tablet Take 1 tablet (50 mg total) by mouth daily.   [DISCONTINUED] clonazePAM (KLONOPIN) 0.5 MG tablet 1 nightly as needed insomnia   [DISCONTINUED] ketoconazole (NIZORAL) 2 % cream Apply 1 Application topically daily as needed (fungus).   calcium carbonate (TUMS - DOSED IN MG ELEMENTAL CALCIUM) 500 MG chewable tablet Chew 1-2 tablets by mouth daily as needed for indigestion or heartburn.   clonazePAM (KLONOPIN) 0.5 MG tablet 1 nightly as needed insomnia   ketoconazole (NIZORAL) 2 % cream Apply 1 Application topically daily as  needed (fungus).   MISC NATURAL PRODUCTS PO Take 1 tablet by mouth daily. Super Beets otc supplement   ONETOUCH ULTRA test strip TEST ONCE DAILY   [DISCONTINUED] dapagliflozin propanediol (FARXIGA) 5 MG TABS tablet Take 1 tablet (5 mg total) by mouth daily before breakfast.   [DISCONTINUED] zolpidem (AMBIEN) 5 MG tablet Take 1 tablet (5 mg total) by mouth at bedtime.   No facility-administered encounter medications on file as of 07/27/2022.    Results for orders placed or performed in visit on 07/19/22  Chromogranin A  Result Value Ref Range   Chromogranin A (ng/mL) 120.3 (H) 0.0 - 101.8 ng/mL  CMP (Cancer Center only)  Result Value Ref Range   Sodium 137 135 - 145 mmol/L   Potassium 4.0 3.5 - 5.1 mmol/L   Chloride 100 98 - 111 mmol/L   CO2 33 (H) 22 - 32 mmol/L   Glucose, Bld 184 (H) 70 - 99 mg/dL   BUN 26 (H) 8 - 23 mg/dL   Creatinine 4.09 (H) 8.11 - 1.00 mg/dL   Calcium 91.4 8.9 - 78.2 mg/dL   Total Protein 7.4 6.5 - 8.1 g/dL   Albumin 4.4 3.5 - 5.0 g/dL   AST 72 (H) 15 - 41 U/L   ALT 43 0 - 44 U/L   Alkaline Phosphatase 73 38 - 126 U/L   Total Bilirubin 0.8 0.3 - 1.2 mg/dL   GFR, Estimated  48 (L) >60 mL/min   Anion gap 4 (L) 5 - 15  CBC with Differential (Cancer Center Only)  Result Value Ref Range   WBC Count 6.1 4.0 - 10.5 K/uL   RBC 3.49 (L) 3.87 - 5.11 MIL/uL   Hemoglobin 10.4 (L) 12.0 - 15.0 g/dL   HCT 04.5 (L) 40.9 - 81.1 %   MCV 93.7 80.0 - 100.0 fL   MCH 29.8 26.0 - 34.0 pg   MCHC 31.8 30.0 - 36.0 g/dL   RDW 91.4 78.2 - 95.6 %   Platelet Count 147 (L) 150 - 400 K/uL   nRBC 0.0 0.0 - 0.2 %   Neutrophils Relative % 68 %   Neutro Abs 4.2 1.7 - 7.7 K/uL   Lymphocytes Relative 19 %   Lymphs Abs 1.2 0.7 - 4.0 K/uL   Monocytes Relative 7 %   Monocytes Absolute 0.4 0.1 - 1.0 K/uL   Eosinophils Relative 5 %   Eosinophils Absolute 0.3 0.0 - 0.5 K/uL   Basophils Relative 1 %   Basophils Absolute 0.1 0.0 - 0.1 K/uL   Immature Granulocytes 0 %   Abs Immature  Granulocytes 0.02 0.00 - 0.07 K/uL     Review of Systems     Objective:   Physical Exam General-in no acute distress Eyes-no discharge Lungs-respiratory rate normal, CTA CV-no murmurs,RRR Extremities skin warm dry no edema Neuro grossly normal Behavior normal, alert        Assessment & Plan:   1. Hypothyroidism, unspecified type Continue thyroid medicine  2. Elevated serum creatinine I am concerned about this check urine micro protein along with met 7.  A1c in June We did have the patient on further disease it did not help her sugar and patient had negative side effects Also patient was on lisinopril 20 mg and had low blood pressures so cardiology stopped it Her blood pressures today 120/58 sitting 118/52 standing  May need to consider restarting a low-dose lisinopril or ARB - Basic Metabolic Panel (7) - Hemoglobin A1c - Microalbumin/Creatinine Ratio, Urine  3. Type 2 diabetes mellitus with stage 3b chronic kidney disease, without long-term current use of insulin (HCC) Please see discussion above Januvia seems to be helping Numbers seem to be coming down - Basic Metabolic Panel (7) - Hemoglobin A1c - Microalbumin/Creatinine Ratio, Urine  4. Low hemoglobin This is related into her underlying health issues but labs reviewed with patient  5. Primary insomnia She was on Ambien and we came off of Ambien and Celexa she had a terrible time restarted the Celexa still could not sleep having a terrible time we had to reinitiate medication clonazepam 0.5 mg she is doing well with this I encouraged her during the summer to try to cut this down to three fourths of a tablet each evening and then follow-up in the fall time we will gradually try to reduce this over time caution drowsiness  6. Thrombocytopenia (HCC) Platelets are down but not in a dangerous range this was discussed with the patient

## 2022-08-04 ENCOUNTER — Ambulatory Visit (HOSPITAL_COMMUNITY)
Admission: RE | Admit: 2022-08-04 | Discharge: 2022-08-04 | Disposition: A | Discharge status: Home / Self Care | Payer: Medicare Other | Source: Ambulatory Visit | Attending: Nurse Practitioner | Admitting: Nurse Practitioner

## 2022-08-04 ENCOUNTER — Other Ambulatory Visit (HOSPITAL_COMMUNITY)
Admission: RE | Admit: 2022-08-04 | Discharge: 2022-08-04 | Disposition: A | Discharge status: Home / Self Care | Payer: Medicare Other | Source: Ambulatory Visit | Attending: Nurse Practitioner | Admitting: Nurse Practitioner

## 2022-08-04 ENCOUNTER — Ambulatory Visit (INDEPENDENT_AMBULATORY_CARE_PROVIDER_SITE_OTHER): Payer: Medicare Other | Admitting: Nurse Practitioner

## 2022-08-04 ENCOUNTER — Encounter: Payer: Self-pay | Admitting: Nurse Practitioner

## 2022-08-04 VITALS — BP 120/63 | HR 53 | Ht 68.5 in | Wt 164.0 lb

## 2022-08-04 DIAGNOSIS — M7989 Other specified soft tissue disorders: Secondary | ICD-10-CM

## 2022-08-04 DIAGNOSIS — M10071 Idiopathic gout, right ankle and foot: Secondary | ICD-10-CM

## 2022-08-04 DIAGNOSIS — N1832 Chronic kidney disease, stage 3b: Secondary | ICD-10-CM

## 2022-08-04 LAB — URIC ACID: Uric Acid, Serum: 6 mg/dL (ref 2.5–7.1)

## 2022-08-04 MED ORDER — COLCHICINE 0.6 MG PO CAPS: ORAL_CAPSULE | ORAL | 0 refills | Status: DC

## 2022-08-04 NOTE — Progress Notes (Unsigned)
   Subjective:    Patient ID: Sabrina Deleon, female    DOB: January 02, 1951, 72 y.o.   MRN: [id number]  HPI Right foot swelling 4 days , bruising and red knot on top of foot Started colchicine   Review of Systems     Objective:   Physical Exam        Assessment & Plan:

## 2022-08-05 ENCOUNTER — Encounter: Payer: Self-pay | Admitting: Nurse Practitioner

## 2022-08-06 MED ORDER — LISINOPRIL 2.5 MG PO TABS: 2.5000 mg | ORAL_TABLET | Freq: Every day | ORAL | 1 refills | Status: DC

## 2022-08-16 ENCOUNTER — Inpatient Hospital Stay: Payer: Medicare Other

## 2022-08-16 ENCOUNTER — Other Ambulatory Visit: Payer: Self-pay | Admitting: Hematology and Oncology

## 2022-08-16 ENCOUNTER — Inpatient Hospital Stay: Payer: Medicare Other | Admitting: Hematology and Oncology

## 2022-08-16 VITALS — BP 143/67 | HR 58 | Temp 98.2°F | Resp 14 | Wt 160.6 lb

## 2022-08-16 DIAGNOSIS — C7A8 Other malignant neuroendocrine tumors: Secondary | ICD-10-CM

## 2022-08-16 DIAGNOSIS — C7A00 Malignant carcinoid tumor of unspecified site: Secondary | ICD-10-CM | POA: Diagnosis not present

## 2022-08-16 DIAGNOSIS — E1165 Type 2 diabetes mellitus with hyperglycemia: Secondary | ICD-10-CM | POA: Diagnosis not present

## 2022-08-16 DIAGNOSIS — C7B01 Secondary carcinoid tumors of distant lymph nodes: Secondary | ICD-10-CM | POA: Diagnosis not present

## 2022-08-16 DIAGNOSIS — Z79899 Other long term (current) drug therapy: Secondary | ICD-10-CM | POA: Diagnosis not present

## 2022-08-16 DIAGNOSIS — Z7984 Long term (current) use of oral hypoglycemic drugs: Secondary | ICD-10-CM | POA: Diagnosis not present

## 2022-08-16 LAB — CBC WITH DIFFERENTIAL (CANCER CENTER ONLY)
Abs Immature Granulocytes: 0.01 10*3/uL (ref 0.00–0.07)
Basophils Absolute: 0.1 10*3/uL (ref 0.0–0.1)
Basophils Relative: 1 %
Eosinophils Absolute: 0.3 10*3/uL (ref 0.0–0.5)
Eosinophils Relative: 5 %
HCT: 34.6 % — ABNORMAL LOW (ref 36.0–46.0)
Hemoglobin: 11.1 g/dL — ABNORMAL LOW (ref 12.0–15.0)
Immature Granulocytes: 0 %
Lymphocytes Relative: 23 %
Lymphs Abs: 1.4 10*3/uL (ref 0.7–4.0)
MCH: 29.5 pg (ref 26.0–34.0)
MCHC: 32.1 g/dL (ref 30.0–36.0)
MCV: 92 fL (ref 80.0–100.0)
Monocytes Absolute: 0.5 10*3/uL (ref 0.1–1.0)
Monocytes Relative: 9 %
Neutro Abs: 3.6 10*3/uL (ref 1.7–7.7)
Neutrophils Relative %: 62 %
Platelet Count: 148 10*3/uL — ABNORMAL LOW (ref 150–400)
RBC: 3.76 MIL/uL — ABNORMAL LOW (ref 3.87–5.11)
RDW: 13.7 % (ref 11.5–15.5)
WBC Count: 5.9 10*3/uL (ref 4.0–10.5)
nRBC: 0 % (ref 0.0–0.2)

## 2022-08-16 LAB — CMP (CANCER CENTER ONLY)
ALT: 25 U/L (ref 0–44)
AST: 55 U/L — ABNORMAL HIGH (ref 15–41)
Albumin: 4.5 g/dL (ref 3.5–5.0)
Alkaline Phosphatase: 75 U/L (ref 38–126)
Anion gap: 5 (ref 5–15)
BUN: 22 mg/dL (ref 8–23)
CO2: 32 mmol/L (ref 22–32)
Calcium: 10.8 mg/dL — ABNORMAL HIGH (ref 8.9–10.3)
Chloride: 102 mmol/L (ref 98–111)
Creatinine: 1.19 mg/dL — ABNORMAL HIGH (ref 0.44–1.00)
GFR, Estimated: 49 mL/min — ABNORMAL LOW (ref >60)
Glucose, Bld: 134 mg/dL — ABNORMAL HIGH (ref 70–99)
Potassium: 4.3 mmol/L (ref 3.5–5.1)
Sodium: 139 mmol/L (ref 135–145)
Total Bilirubin: 0.8 mg/dL (ref 0.3–1.2)
Total Protein: 7.8 g/dL (ref 6.5–8.1)

## 2022-08-16 MED ORDER — LANREOTIDE ACETATE 120 MG/0.5ML ~~LOC~~ SOLN
120.0000 mg | Freq: Once | SUBCUTANEOUS | Status: AC
  Administered 2022-08-16: 120 mg via SUBCUTANEOUS
  Filled 2022-08-16: qty 120

## 2022-08-16 NOTE — Progress Notes (Signed)
[facility] [phone]   [phone]  PROGRESS NOTE  Patient Care Team: Babs Sciara, MD as PCP - General (Family Medicine) Jonelle Sidle, MD as PCP - Cardiology (Cardiology)  Hematological/Oncological History # Well Differentiated Neuroendocrine Tumor, GI origin. Grade 2.  11/29/2021: Patient underwent CT angiogram C/A/P prior to consideration of TAVR for severe aortic stenosis, noted to have bulky central mesenteric soft tissue mass that has increased in 2019.  Additionally there was new widespread mild to moderate retroperitoneal, retrocrural, mediastinal and bilateral hilar lymphadenopathy 12/15/2021: NM PET CT Scan showed hypermetabolic mesenteric mass with hypermetabolic adenopathy in the small bowel mesentery, abdominal retroperitoneum and retrocrural stations. 12/28/2021: IR guided biopsy of the mesenteric mass was consistent with well differentiated neuroendocrine tumor 01/10/2022: start of lanreotide 120mg  IM q 28 days.   Interval History:  Sabrina Deleon 72 y.o. female with medical history significant for newly diagnosed neuroendocrine tumor who presents for a follow up visit. The patient's last visit was on 05/24/2022. In the interim since the last visit she continued lanreotide treatment.  On exam today Mrs. Yonko reports she is tolerating her lanreotide shots quite well.  Her last shot did cause some slight pain during the injection but it was brief.  She reports that she has not been having any issues with loose stools, nausea, vomiting or diarrhea after receiving the shot.  She notes that her weight is down 260 pounds, down from 164 pounds.  She notes that she is not having any rash or itching at the site of the lanreotide injection.  Her appetite remains strong and she notes she is not requiring any of her supportive medications such as the Bentyl or the Zofran.  She denies any fevers, chills, sweats, nausea, vomiting or diarrhea. Full 10  point ROS was otherwise negative.   MEDICAL HISTORY:  Past Medical History:  Diagnosis Date   Anxiety    Aortic stenosis    Arthritis    Chronic kidney disease    Dr. Salem Caster- noted increase kidney function levels- took pt off Metformin and started on Onglyza for Blood sugar control-pt being followed for this.   GERD (gastroesophageal reflux disease)    Gout    Hypertension    Hypothyroidism    Morbid obesity (HCC) 03/29/2018   Patient has elevated BMI with diabetes hyperlipidemia and hypertension   Pancreatitis    Type 2 diabetes mellitus (HCC)    Wears glasses    Wears partial dentures    Upper    SURGICAL HISTORY: Past Surgical History:  Procedure Laterality Date   BACK SURGERY     Lumbar   CARPAL TUNNEL RELEASE  11/02/2011   Procedure: CARPAL TUNNEL RELEASE;  Surgeon: Tami Ribas, MD;  Location: [facility];  Service: Orthopedics;  Laterality: Right;   CARPAL TUNNEL RELEASE Left 01/14/2015   Procedure: LEFT CARPAL TUNNEL RELEASE;  Surgeon: Betha Loa, MD;  Location: [facility];  Service: Orthopedics;  Laterality: Left;   CHOLECYSTECTOMY     laparoscopic   COLONOSCOPY     COLONOSCOPY N/A 11/12/2015   Procedure: COLONOSCOPY;  Surgeon: Malissa Hippo, MD;  Location: [facility];  Service: Endoscopy;  Laterality: N/A;  9:30   ESOPHAGOGASTRODUODENOSCOPY N/A 11/12/2015   Procedure: ESOPHAGOGASTRODUODENOSCOPY (EGD);  Surgeon: Malissa Hippo, MD;  Location: [facility];  Service: Endoscopy;  Laterality: N/A;   REVERSE SHOULDER ARTHROPLASTY Right 11/04/2018   REVERSE SHOULDER ARTHROPLASTY Right 11/04/2018   Procedure: REVERSE  SHOULDER ARTHROPLASTY;  Surgeon: Ernest Mallick, MD;  Location: [facility];  Service: Orthopedics;  Laterality: Right;   RIGHT/LEFT HEART CATH AND CORONARY ANGIOGRAPHY N/A 10/18/2017   Procedure: RIGHT/LEFT HEART CATH AND CORONARY ANGIOGRAPHY;  Surgeon: Kathleene Hazel, MD;  Location: [facility];   Service: Cardiovascular;  Laterality: N/A;   TOTAL HIP ARTHROPLASTY Left 03/22/2016   Procedure: LEFT TOTAL HIP ARTHROPLASTY ANTERIOR APPROACH;  Surgeon: Ollen Gross, MD;  Location: [facility];  Service: Orthopedics;  Laterality: Left;    SOCIAL HISTORY: Social History   Socioeconomic History   Marital status: Married    Spouse name: Not on file   Number of children: Not on file   Years of education: Not on file   Highest education level: Some college, no degree  Occupational History   Not on file  Tobacco Use   Smoking status: Former    Types: Cigarettes    Quit date: 10/29/1981    Years since quitting: 40.8   Smokeless tobacco: Never  Vaping Use   Vaping Use: Never used  Substance and Sexual Activity   Alcohol use: Yes    Comment: occasionally   Drug use: No   Sexual activity: Not Currently  Other Topics Concern   Not on file  Social History Narrative   Not on file   Social Determinants of Health   Financial Resource Strain: Patient Declined (07/26/2022)   Overall Financial Resource Strain (CARDIA)    Difficulty of Paying Living Expenses: Patient declined  Food Insecurity: No Food Insecurity (07/26/2022)   Hunger Vital Sign    Worried About Running Out of Food in the Last Year: Never true    Ran Out of Food in the Last Year: Never true  Transportation Needs: No Transportation Needs (07/26/2022)   PRAPARE - Administrator, Civil Service (Medical): No    Lack of Transportation (Non-Medical): No  Physical Activity: Insufficiently Active (07/26/2022)   Exercise Vital Sign    Days of Exercise per Week: 2 days    Minutes of Exercise per Session: 20 min  Stress: No Stress Concern Present (07/26/2022)   [employer] of Occupational Health - Occupational Stress Questionnaire    Feeling of Stress : Only a little  Social Connections: Socially Integrated (07/26/2022)   Social Connection and Isolation Panel [NHANES]    Frequency of Communication with Friends and Family:  More than three times a week    Frequency of Social Gatherings with Friends and Family: More than three times a week    Attends Religious Services: More than 4 times per year    Active Member of [employer] or Organizations: Yes    Attends Engineer, structural: More than 4 times per year    Marital Status: Married  Catering manager Violence: Not on file    FAMILY HISTORY: Family History  Problem Relation Age of Onset   Breast cancer Mother    Lung cancer Mother        likely recurrent breast cancer   Breast cancer Paternal Grandmother    Head & neck cancer Nephew     ALLERGIES:  is allergic to bee venom.  MEDICATIONS:  Current Outpatient Medications  Medication Sig Dispense Refill   allopurinol (ZYLOPRIM) 100 MG tablet TAKE 1 TABLET(100 MG) BY MOUTH TWICE DAILY 180 tablet 1   aspirin EC 81 MG tablet Take 81 mg by mouth at bedtime.     Cholecalciferol (VITAMIN D) 50 MCG (2000 UT) tablet Take  2,000 Units by mouth daily.     citalopram (CELEXA) 20 MG tablet Take 1 tablet (20 mg total) by mouth daily. 90 tablet 1   clonazePAM (KLONOPIN) 0.5 MG tablet 1 nightly as needed insomnia 30 tablet 5   Colchicine 0.6 MG CAPS Take one cap po BID prn gout flare up 30 capsule 0   dicyclomine (BENTYL) 10 MG capsule Take 1 capsule (10 mg total) by mouth 3 (three) times daily before meals. 90 capsule 0   ferrous sulfate 325 (65 FE) MG tablet Take 325 mg by mouth daily with breakfast.     furosemide (LASIX) 40 MG tablet TAKE 1 TABLET(40 MG) BY MOUTH DAILY 90 tablet 1   ketoconazole (NIZORAL) 2 % cream Apply 1 Application topically daily as needed (fungus). 60 g 0   levothyroxine (SYNTHROID) 125 MCG tablet TAKE 1 TABLET(125 MCG) BY MOUTH DAILY BEFORE BREAKFAST 90 tablet 1   lisinopril (ZESTRIL) 2.5 MG tablet Take 1 tablet (2.5 mg total) by mouth daily. 90 tablet 1   MISC NATURAL PRODUCTS PO Take 1 tablet by mouth daily. Super Beets otc supplement     ondansetron (ZOFRAN) 8 MG tablet Take 1 tablet  (8 mg total) by mouth every 8 (eight) hours as needed. 30 tablet 0   ONETOUCH ULTRA test strip TEST ONCE DAILY 50 strip 5   pantoprazole (PROTONIX) 40 MG tablet TAKE 1 TABLET(40 MG) BY MOUTH DAILY (Patient taking differently: Take 40 mg by mouth daily.) 90 tablet 1   potassium chloride (KLOR-CON M) 10 MEQ tablet TAKE 1 TABLET BY MOUTH EVERY DAY ON MONDAY, WEDNESDAY AND FRIDAY 14 tablet 6   rosuvastatin (CRESTOR) 10 MG tablet TAKE 1 TABLET(10 MG) BY MOUTH DAILY FOR CHOLESTEROL. STOP WHILE TAKING COLCHICINE FOR GOUT 90 tablet 1   sitaGLIPtin (JANUVIA) 50 MG tablet Take 1 tablet (50 mg total) by mouth daily. 30 tablet 4   No current facility-administered medications for this visit.    REVIEW OF SYSTEMS:   Constitutional: ( - ) fevers, ( - )  chills , ( - ) night sweats Eyes: ( - ) blurriness of vision, ( - ) double vision, ( - ) watery eyes Ears, nose, mouth, throat, and face: ( - ) mucositis, ( - ) sore throat Respiratory: ( - ) cough, ( - ) dyspnea, ( - ) wheezes Cardiovascular: ( - ) palpitation, ( - ) chest discomfort, ( - ) lower extremity swelling Gastrointestinal:  ( - ) nausea, ( - ) heartburn, ( - ) change in bowel habits Skin: ( - ) abnormal skin rashes Lymphatics: ( - ) new lymphadenopathy, ( - ) easy bruising Neurological: ( - ) numbness, ( - ) tingling, ( - ) new weaknesses Behavioral/Psych: ( - ) mood change, ( - ) new changes  All other systems were reviewed with the patient and are negative.  PHYSICAL EXAMINATION: ECOG PERFORMANCE STATUS: 1 - Symptomatic but completely ambulatory  Vitals:   08/16/22 1202  BP: (!) 143/67  Pulse: (!) 58  Resp: 14  Temp: 98.2 F (36.8 C)  SpO2: 100%   Filed Weights   08/16/22 1202  Weight: 160 lb 9.6 oz (72.8 kg)    GENERAL: Well-appearing elderly Caucasian female, alert, no distress and comfortable SKIN: skin color, texture, turgor are normal, no rashes or significant lesions EYES: conjunctiva are pink and non-injected, sclera  clear LUNGS: clear to auscultation and percussion with normal breathing effort HEART: regular rate & rhythm and no murmurs and no lower extremity  edema Musculoskeletal: no cyanosis of digits and no clubbing  PSYCH: alert & oriented x 3, fluent speech NEURO: no focal motor/sensory deficits  LABORATORY DATA:  I have reviewed the data as listed    Latest Ref Rng & Units 08/16/2022   10:54 AM 07/19/2022   10:52 AM 06/21/2022   11:26 AM  CBC  WBC 4.0 - 10.5 K/uL 5.9  6.1  6.6   Hemoglobin 12.0 - 15.0 g/dL 09.8  11.9  9.7   Hematocrit 36.0 - 46.0 % 34.6  32.7  30.1   Platelets 150 - 400 K/uL 148  147  179        Latest Ref Rng & Units 08/16/2022   10:54 AM 07/19/2022   10:52 AM 06/21/2022    3:17 PM  CMP  Glucose 70 - 99 mg/dL 147  829  562   BUN 8 - 23 mg/dL 22  26  36   Creatinine 0.44 - 1.00 mg/dL 1.30  8.65  7.84   Sodium 135 - 145 mmol/L 139  137  140   Potassium 3.5 - 5.1 mmol/L 4.3  4.0  5.0   Chloride 98 - 111 mmol/L 102  100  104   CO2 22 - 32 mmol/L 32  33  23   Calcium 8.9 - 10.3 mg/dL 69.6  29.5  28.4   Total Protein 6.5 - 8.1 g/dL 7.8  7.4    Total Bilirubin 0.3 - 1.2 mg/dL 0.8  0.8    Alkaline Phos 38 - 126 U/L 75  73    AST 15 - 41 U/L 55  72    ALT 0 - 44 U/L 25  43     RADIOGRAPHIC STUDIES: DG Foot 2 Views Right  Result Date: 08/04/2022 CLINICAL DATA:  Right foot swelling. EXAM: RIGHT FOOT - 2 VIEW COMPARISON:  None Available. FINDINGS: Soft swelling is present over the dorsum of the foot. Exaggerated angulation is present at the first MTP joint. A plantar calcaneal spur is present with some calcification along the tendon. No acute osseous abnormality is present. IMPRESSION: 1. Soft tissue swelling over the dorsum of the foot. 2. No acute osseous abnormality. 3. Exaggerated angulation at the first MTP joint. Electronically Signed   By: Marin Roberts M.D.   On: 08/04/2022 17:07    ASSESSMENT & PLAN Ripley R Meikle 72 y.o. female with medical history significant for  newly diagnosed neuroendocrine tumor who presents for a follow up visit.  #Metastatic Well Differentiated Neuroendocrine Tumor of the GI Tract -- CT imaging and PET CT scan confirmed widespread lymphadenopathy consistent with metastatic spread of well-differentiated neuroendocrine tumor. -- Biopsy confirms this is a well differentiated neuroendocrine tumor of the GI tract. -- Today we will proceed with lanreotide 120 mg IM q. 28 days. --labs today show white blood cell 5.9, hemoglobin 9.1, MCV 92, and platelets of 148 --Due for repeat CT imaging to confirm treatment response. We will order and try to schedule over the next 4 weeks.  -- Have patient return to clinic in 12 weeks time with interval monthly shots.   #Appetite changes: --Will refer to [facility] Nutrition team for further recommendations.   #Hyperglycemia: --Today's glucose level is 134 --Recommend to follow up with PCP to discuss interventions including starting new medications.   No orders of the defined types were placed in this encounter.   All questions were answered. The patient knows to call the clinic with any problems, questions or concerns.  I have spent  a total of 30 minutes minutes of face-to-face and non-face-to-face time, preparing to see the patient, performing a medically appropriate examination, counseling and educating the patient, ordering tests/procedures, referring and communicating with other health care professionals, documenting clinical information in the electronic health record, and care coordination.   Ulysees Barns, MD Department of Hematology/Oncology [facility] Phone: [phone] Pager: [phone] Email: [email or web address]    08/16/2022 4:58 PM

## 2022-08-18 ENCOUNTER — Telehealth: Payer: Self-pay | Admitting: Hematology and Oncology

## 2022-08-22 LAB — CHROMOGRANIN A: Chromogranin A (ng/mL): 121 ng/mL — ABNORMAL HIGH (ref 0.0–101.8)

## 2022-08-23 DIAGNOSIS — R7989 Other specified abnormal findings of blood chemistry: Secondary | ICD-10-CM | POA: Diagnosis not present

## 2022-08-23 DIAGNOSIS — N1832 Chronic kidney disease, stage 3b: Secondary | ICD-10-CM | POA: Diagnosis not present

## 2022-08-23 DIAGNOSIS — E1122 Type 2 diabetes mellitus with diabetic chronic kidney disease: Secondary | ICD-10-CM | POA: Diagnosis not present

## 2022-08-24 LAB — BASIC METABOLIC PANEL (7)
BUN/Creatinine Ratio: 21 (ref 12–28)
BUN: 27 mg/dL (ref 8–27)
CO2: 27 mmol/L (ref 20–29)
Chloride: 100 mmol/L (ref 96–106)
Creatinine, Ser: 1.3 mg/dL — ABNORMAL HIGH (ref 0.57–1.00)
Glucose: 133 mg/dL — ABNORMAL HIGH (ref 70–99)
Potassium: 4.9 mmol/L (ref 3.5–5.2)
Sodium: 140 mmol/L (ref 134–144)
eGFR: 44 mL/min/{1.73_m2} — ABNORMAL LOW (ref >59)

## 2022-08-24 LAB — HEMOGLOBIN A1C
Est. average glucose Bld gHb Est-mCnc: 148 mg/dL
Hgb A1c MFr Bld: 6.8 % — ABNORMAL HIGH (ref 4.8–5.6)

## 2022-08-24 LAB — MICROALBUMIN / CREATININE URINE RATIO
Creatinine, Urine: 41.3 mg/dL
Microalb/Creat Ratio: 18 mg/g creat (ref 0–29)
Microalbumin, Urine: 7.6 ug/mL

## 2022-09-07 ENCOUNTER — Ambulatory Visit: Payer: Medicare Other | Admitting: Family Medicine

## 2022-09-11 ENCOUNTER — Other Ambulatory Visit: Payer: Self-pay | Admitting: Family Medicine

## 2022-09-13 ENCOUNTER — Other Ambulatory Visit: Payer: Self-pay | Admitting: Hematology and Oncology

## 2022-09-13 ENCOUNTER — Other Ambulatory Visit: Payer: Self-pay

## 2022-09-13 ENCOUNTER — Inpatient Hospital Stay: Payer: Medicare Other | Attending: Physician Assistant

## 2022-09-13 ENCOUNTER — Inpatient Hospital Stay: Payer: Medicare Other

## 2022-09-13 ENCOUNTER — Encounter: Payer: Self-pay | Admitting: Hematology and Oncology

## 2022-09-13 VITALS — BP 144/63 | HR 51 | Temp 98.4°F | Resp 18

## 2022-09-13 DIAGNOSIS — C7A8 Other malignant neuroendocrine tumors: Secondary | ICD-10-CM

## 2022-09-13 DIAGNOSIS — C7B01 Secondary carcinoid tumors of distant lymph nodes: Secondary | ICD-10-CM | POA: Insufficient documentation

## 2022-09-13 DIAGNOSIS — C7A00 Malignant carcinoid tumor of unspecified site: Secondary | ICD-10-CM | POA: Diagnosis not present

## 2022-09-13 LAB — CMP (CANCER CENTER ONLY)
ALT: 41 U/L (ref 0–44)
AST: 73 U/L — ABNORMAL HIGH (ref 15–41)
Albumin: 4.2 g/dL (ref 3.5–5.0)
Alkaline Phosphatase: 68 U/L (ref 38–126)
Anion gap: 3 — ABNORMAL LOW (ref 5–15)
BUN: 26 mg/dL — ABNORMAL HIGH (ref 8–23)
CO2: 34 mmol/L — ABNORMAL HIGH (ref 22–32)
Calcium: 10.9 mg/dL — ABNORMAL HIGH (ref 8.9–10.3)
Chloride: 102 mmol/L (ref 98–111)
Creatinine: 1.08 mg/dL — ABNORMAL HIGH (ref 0.44–1.00)
GFR, Estimated: 55 mL/min — ABNORMAL LOW (ref >60)
Glucose, Bld: 138 mg/dL — ABNORMAL HIGH (ref 70–99)
Potassium: 4.5 mmol/L (ref 3.5–5.1)
Sodium: 139 mmol/L (ref 135–145)
Total Bilirubin: 0.8 mg/dL (ref 0.3–1.2)
Total Protein: 7.5 g/dL (ref 6.5–8.1)

## 2022-09-13 LAB — CBC WITH DIFFERENTIAL (CANCER CENTER ONLY)
Abs Immature Granulocytes: 0.01 10*3/uL (ref 0.00–0.07)
Basophils Absolute: 0 10*3/uL (ref 0.0–0.1)
Basophils Relative: 1 %
Eosinophils Absolute: 0.3 10*3/uL (ref 0.0–0.5)
Eosinophils Relative: 5 %
HCT: 34 % — ABNORMAL LOW (ref 36.0–46.0)
Hemoglobin: 10.9 g/dL — ABNORMAL LOW (ref 12.0–15.0)
Immature Granulocytes: 0 %
Lymphocytes Relative: 22 %
Lymphs Abs: 1.3 10*3/uL (ref 0.7–4.0)
MCH: 29.9 pg (ref 26.0–34.0)
MCHC: 32.1 g/dL (ref 30.0–36.0)
MCV: 93.2 fL (ref 80.0–100.0)
Monocytes Absolute: 0.4 10*3/uL (ref 0.1–1.0)
Monocytes Relative: 7 %
Neutro Abs: 3.8 10*3/uL (ref 1.7–7.7)
Neutrophils Relative %: 65 %
Platelet Count: 157 10*3/uL (ref 150–400)
RBC: 3.65 MIL/uL — ABNORMAL LOW (ref 3.87–5.11)
RDW: 13.8 % (ref 11.5–15.5)
WBC Count: 5.8 10*3/uL (ref 4.0–10.5)
nRBC: 0 % (ref 0.0–0.2)

## 2022-09-13 MED ORDER — LANREOTIDE ACETATE 120 MG/0.5ML ~~LOC~~ SOLN
120.0000 mg | Freq: Once | SUBCUTANEOUS | Status: AC
  Administered 2022-09-13: 120 mg via SUBCUTANEOUS
  Filled 2022-09-13: qty 120

## 2022-09-14 LAB — CHROMOGRANIN A: Chromogranin A (ng/mL): 115 ng/mL — ABNORMAL HIGH (ref 0.0–101.8)

## 2022-09-22 ENCOUNTER — Telehealth: Payer: Self-pay | Admitting: Nurse Practitioner

## 2022-09-22 MED ORDER — COLCHICINE 0.6 MG PO CAPS: ORAL_CAPSULE | ORAL | 0 refills | Status: AC

## 2022-09-22 NOTE — Telephone Encounter (Signed)
Script sent to pharmacy.

## 2022-09-22 NOTE — Telephone Encounter (Signed)
Refill on  Colchicine 0.6 MG CAPS sent to [facility] -[address]

## 2022-10-11 ENCOUNTER — Inpatient Hospital Stay: Payer: Medicare Other

## 2022-10-11 ENCOUNTER — Other Ambulatory Visit: Payer: Self-pay

## 2022-10-11 ENCOUNTER — Inpatient Hospital Stay: Payer: Medicare Other | Attending: Physician Assistant

## 2022-10-11 VITALS — BP 132/40 | HR 54 | Temp 98.2°F | Resp 16

## 2022-10-11 DIAGNOSIS — C7A00 Malignant carcinoid tumor of unspecified site: Secondary | ICD-10-CM | POA: Insufficient documentation

## 2022-10-11 DIAGNOSIS — C7A8 Other malignant neuroendocrine tumors: Secondary | ICD-10-CM

## 2022-10-11 DIAGNOSIS — C7B01 Secondary carcinoid tumors of distant lymph nodes: Secondary | ICD-10-CM | POA: Insufficient documentation

## 2022-10-11 LAB — CMP (CANCER CENTER ONLY)
ALT: 27 U/L (ref 0–44)
AST: 53 U/L — ABNORMAL HIGH (ref 15–41)
Albumin: 4.3 g/dL (ref 3.5–5.0)
Alkaline Phosphatase: 76 U/L (ref 38–126)
Anion gap: 3 — ABNORMAL LOW (ref 5–15)
BUN: 26 mg/dL — ABNORMAL HIGH (ref 8–23)
CO2: 32 mmol/L (ref 22–32)
Calcium: 10.7 mg/dL — ABNORMAL HIGH (ref 8.9–10.3)
Chloride: 103 mmol/L (ref 98–111)
Creatinine: 1.07 mg/dL — ABNORMAL HIGH (ref 0.44–1.00)
GFR, Estimated: 56 mL/min — ABNORMAL LOW (ref >60)
Glucose, Bld: 141 mg/dL — ABNORMAL HIGH (ref 70–99)
Potassium: 4.9 mmol/L (ref 3.5–5.1)
Sodium: 138 mmol/L (ref 135–145)
Total Bilirubin: 0.5 mg/dL (ref 0.3–1.2)
Total Protein: 6.9 g/dL (ref 6.5–8.1)

## 2022-10-11 LAB — CBC WITH DIFFERENTIAL (CANCER CENTER ONLY)
Abs Immature Granulocytes: 0.09 10*3/uL — ABNORMAL HIGH (ref 0.00–0.07)
Basophils Absolute: 0 10*3/uL (ref 0.0–0.1)
Basophils Relative: 1 %
Eosinophils Absolute: 0.4 10*3/uL (ref 0.0–0.5)
Eosinophils Relative: 5 %
HCT: 34.3 % — ABNORMAL LOW (ref 36.0–46.0)
Hemoglobin: 11 g/dL — ABNORMAL LOW (ref 12.0–15.0)
Immature Granulocytes: 1 %
Lymphocytes Relative: 17 %
Lymphs Abs: 1.2 10*3/uL (ref 0.7–4.0)
MCH: 29.9 pg (ref 26.0–34.0)
MCHC: 32.1 g/dL (ref 30.0–36.0)
MCV: 93.2 fL (ref 80.0–100.0)
Monocytes Absolute: 0.6 10*3/uL (ref 0.1–1.0)
Monocytes Relative: 8 %
Neutro Abs: 4.7 10*3/uL (ref 1.7–7.7)
Neutrophils Relative %: 68 %
Platelet Count: 136 10*3/uL — ABNORMAL LOW (ref 150–400)
RBC: 3.68 MIL/uL — ABNORMAL LOW (ref 3.87–5.11)
RDW: 13.8 % (ref 11.5–15.5)
WBC Count: 6.9 10*3/uL (ref 4.0–10.5)
nRBC: 0 % (ref 0.0–0.2)

## 2022-10-11 MED ORDER — LANREOTIDE ACETATE 120 MG/0.5ML ~~LOC~~ SOLN
120.0000 mg | Freq: Once | SUBCUTANEOUS | Status: AC
  Administered 2022-10-11: 120 mg via SUBCUTANEOUS
  Filled 2022-10-11: qty 120

## 2022-10-16 ENCOUNTER — Other Ambulatory Visit: Payer: Self-pay | Admitting: Family Medicine

## 2022-10-23 ENCOUNTER — Encounter: Payer: Self-pay | Admitting: Cardiology

## 2022-10-23 ENCOUNTER — Ambulatory Visit: Payer: Medicare Other | Attending: Cardiology | Admitting: Cardiology

## 2022-10-23 VITALS — BP 120/60 | HR 68 | Ht 68.0 in | Wt 162.2 lb

## 2022-10-23 DIAGNOSIS — I6523 Occlusion and stenosis of bilateral carotid arteries: Secondary | ICD-10-CM

## 2022-10-23 DIAGNOSIS — I350 Nonrheumatic aortic (valve) stenosis: Secondary | ICD-10-CM | POA: Diagnosis not present

## 2022-10-23 DIAGNOSIS — I059 Rheumatic mitral valve disease, unspecified: Secondary | ICD-10-CM | POA: Diagnosis not present

## 2022-10-23 NOTE — Patient Instructions (Addendum)
Medication Instructions:  Your physician recommends that you continue on your current medications as directed. Please refer to the Current Medication list given to you today.  Labwork: none  Testing/Procedures: Your physician has requested that you have an echocardiogram. Echocardiography is a painless test that uses sound waves to create images of your heart. It provides your doctor with information about the size and shape of your heart and how well your heart's chambers and valves are working. This procedure takes approximately one hour. There are no restrictions for this procedure. Please do NOT wear cologne, perfume, aftershave, or lotions (deodorant is allowed). Please arrive 15 minutes prior to your appointment time. Your physician has requested that you have a carotid duplex. This test is an ultrasound of the carotid arteries in your neck. It looks at blood flow through these arteries that supply the brain with blood. Allow one hour for this exam. There are no restrictions or special instructions.  Follow-Up: Your physician recommends that you schedule a follow-up appointment in: 6 months  Any Other Special Instructions Will Be Listed Below (If Applicable).  If you need a refill on your cardiac medications before your next appointment, please call your pharmacy.

## 2022-10-23 NOTE — Progress Notes (Signed)
Cardiology Office Note  Date: 10/23/2022   ID: Burnita, Trybus 12-31-1950, MRN [id number]  History of Present Illness: Sabrina Deleon is a 72 y.o. female last seen in April by Ms. Philis Nettle NP, I reviewed the note.  She is here for a routine visit.  Reports NYHA class II dyspnea, no exertional chest pain or syncope.  She has had some falls in the last 6 months.  No orthopnea or PND, no leg swelling.  She remains fairly clear that she does not want to pursue evaluation for SAVR/TAVR at this point.  We have discussed the progressive nature of aortic stenosis and still plan on surveillance in case the situation changes.  Her last echocardiogram was in August of last year.  She is also due for follow-up carotid Dopplers.  I reviewed her medications which are stable from a cardiac perspective.  LDL was 52 in February on Crestor.  She is having some difficulty controlling blood sugars and working on medication adjustments with Dr. Gerda Diss.  Physical Exam: VS:  BP 120/60   Pulse 68   Ht 5\' 8"  (1.727 m)   Wt 162 lb 3.2 oz (73.6 kg)   SpO2 100%   BMI 24.66 kg/m , BMI Body mass index is 24.66 kg/m.  Wt Readings from Last 3 Encounters:  10/23/22 162 lb 3.2 oz (73.6 kg)  08/16/22 160 lb 9.6 oz (72.8 kg)  08/04/22 164 lb (74.4 kg)    General: Patient appears comfortable at rest. HEENT: Conjunctiva and lids normal. Neck: Supple, no elevated JVP, cardiac murmur radiates bilaterally. Lungs: Clear to auscultation, nonlabored breathing at rest. Cardiac: Regular rate and rhythm, no S3, 4/6 harsh systolic murmur with decreased S2, no pericardial rub. Extremities: No pitting edema.  ECG:  An ECG dated 06/26/2022 was personally reviewed today and demonstrated:  Sinus bradycardia.  Labwork: 05/11/2022: TSH 0.498 10/11/2022: ALT 27; AST 53; BUN 26; Creatinine 1.07; Hemoglobin 11.0; Platelet Count 136; Potassium 4.9; Sodium 138     Component Value Date/Time   CHOL 119 05/11/2022 0853   TRIG 78  05/11/2022 0853   HDL 51 05/11/2022 0853   CHOLHDL 2.3 05/11/2022 0853   LDLCALC 52 05/11/2022 0853   Other Studies Reviewed Today:  Echocardiogram 11/03/2021:  1. Left ventricular ejection fraction, by estimation, is 60 to 65%. The  left ventricle has normal function. The left ventricle has no regional  wall motion abnormalities. There is mild left ventricular hypertrophy.  Left ventricular diastolic parameters  are consistent with Grade II diastolic dysfunction (pseudonormalization).  Elevated left atrial pressure. The average left ventricular global  longitudinal strain is -19.6 %. The global longitudinal strain is normal.   2. Right ventricular systolic function is normal. The right ventricular  size is normal. Tricuspid regurgitation signal is inadequate for assessing  PA pressure.   3. Left atrial size was moderately dilated.   4. The mitral valve is abnormal. Moderate mitral valve regurgitation.  Moderate mitral stenosis. MV peak gradient, 20.8 mmHg. The mean mitral  valve gradient is 8.0 mmHg.      Moderate mitral annular calcification.   5. The aortic valve has an indeterminant number of cusps. There is severe  calcifcation of the aortic valve. There is severe thickening of the aortic  valve. Aortic valve regurgitation is moderate. Severe aortic valve  stenosis. . Severe aortic stenosis is   present. Aortic valve mean gradient measures 57.0 mmHg. Aortic valve peak  gradient measures 90.5 mmHg. Aortic valve area, by VTI  measures 0.71 cm.   6. The inferior vena cava is normal in size with greater than 50%  respiratory variability, suggesting right atrial pressure of 3 mmHg.   Cardiac monitor April 2024: ZIO monitor reviewed.  5 days, 13 hours analyzed.   Predominant rhythm is sinus with prolonged PR interval and IVCD.  Heart rate ranged from 39 bpm up to 98 bpm with average heart rate 52 bpm. There were occasional PACs representing 1.9% total beats with otherwise rare  atrial couplets and triplets. There were rare PVCs representing less than 1% total beats. Several brief episodes of PSVT were noted, the longest of which was only 12 beats.  There were no sustained arrhythmias. No pauses or high degree heart block.  Assessment and Plan:  1.  Severe calcific aortic stenosis with mean gradient 57 mmHg by echocardiogram in August 2023.  LVEF remains normal at 60 to 65% with moderate diastolic dysfunction.  She had previously been evaluated for SAVR by Dr. Laneta Simmers, although has been very hesitant to pursue surgery.  She prefers observation at this time, we have discussed the progressive nature of aortic stenosis.  Plan to continue surveillance in case the situation changes, follow-up echocardiogram will be obtained.   2.  Mild, nonobstructive CAD by cardiac catheterization in 2019 during her initial evaluation for aortic stenosis.  Continue aspirin and Crestor.  LDL 52 in February.   3.  Mitral valve disease, both moderate mitral stenosis and regurgitation by recent echocardiogram, mean gradient 8 mmHg.  This also complicates management of aortic stenosis.   4.  Asymptomatic carotid artery disease, nonobstructive by assessment in 2019.  Follow-up carotid Dopplers will be obtained.  Disposition:  Follow up  6 months.  Signed, Jonelle Sidle, M.D., F.A.C.C. [facility] at [facility]

## 2022-10-26 ENCOUNTER — Ambulatory Visit: Payer: Medicare Other | Admitting: Nurse Practitioner

## 2022-11-08 ENCOUNTER — Inpatient Hospital Stay: Payer: Medicare Other

## 2022-11-08 ENCOUNTER — Inpatient Hospital Stay: Payer: Medicare Other | Attending: Physician Assistant | Admitting: Hematology and Oncology

## 2022-11-08 VITALS — BP 140/51 | HR 57 | Temp 97.7°F | Resp 16 | Wt 164.6 lb

## 2022-11-08 DIAGNOSIS — C7A8 Other malignant neuroendocrine tumors: Secondary | ICD-10-CM

## 2022-11-08 DIAGNOSIS — C7B01 Secondary carcinoid tumors of distant lymph nodes: Secondary | ICD-10-CM | POA: Insufficient documentation

## 2022-11-08 DIAGNOSIS — C7A00 Malignant carcinoid tumor of unspecified site: Secondary | ICD-10-CM | POA: Diagnosis not present

## 2022-11-08 LAB — CBC WITH DIFFERENTIAL (CANCER CENTER ONLY)
Abs Immature Granulocytes: 0.02 10*3/uL (ref 0.00–0.07)
Basophils Absolute: 0 10*3/uL (ref 0.0–0.1)
Basophils Relative: 1 %
Eosinophils Absolute: 0.3 10*3/uL (ref 0.0–0.5)
Eosinophils Relative: 6 %
HCT: 35.5 % — ABNORMAL LOW (ref 36.0–46.0)
Hemoglobin: 11.2 g/dL — ABNORMAL LOW (ref 12.0–15.0)
Immature Granulocytes: 0 %
Lymphocytes Relative: 22 %
Lymphs Abs: 1.3 10*3/uL (ref 0.7–4.0)
MCH: 29.9 pg (ref 26.0–34.0)
MCHC: 31.5 g/dL (ref 30.0–36.0)
MCV: 94.7 fL (ref 80.0–100.0)
Monocytes Absolute: 0.4 10*3/uL (ref 0.1–1.0)
Monocytes Relative: 6 %
Neutro Abs: 4 10*3/uL (ref 1.7–7.7)
Neutrophils Relative %: 65 %
Platelet Count: 150 10*3/uL (ref 150–400)
RBC: 3.75 MIL/uL — ABNORMAL LOW (ref 3.87–5.11)
RDW: 14.2 % (ref 11.5–15.5)
WBC Count: 6.1 10*3/uL (ref 4.0–10.5)
nRBC: 0 % (ref 0.0–0.2)

## 2022-11-08 LAB — CMP (CANCER CENTER ONLY)
ALT: 33 U/L (ref 0–44)
AST: 59 U/L — ABNORMAL HIGH (ref 15–41)
Albumin: 4.3 g/dL (ref 3.5–5.0)
Alkaline Phosphatase: 75 U/L (ref 38–126)
Anion gap: 1 — ABNORMAL LOW (ref 5–15)
BUN: 26 mg/dL — ABNORMAL HIGH (ref 8–23)
CO2: 36 mmol/L — ABNORMAL HIGH (ref 22–32)
Calcium: 10.6 mg/dL — ABNORMAL HIGH (ref 8.9–10.3)
Chloride: 102 mmol/L (ref 98–111)
Creatinine: 1.11 mg/dL — ABNORMAL HIGH (ref 0.44–1.00)
GFR, Estimated: 53 mL/min — ABNORMAL LOW (ref >60)
Glucose, Bld: 146 mg/dL — ABNORMAL HIGH (ref 70–99)
Potassium: 4.9 mmol/L (ref 3.5–5.1)
Sodium: 139 mmol/L (ref 135–145)
Total Bilirubin: 0.6 mg/dL (ref 0.3–1.2)
Total Protein: 7.5 g/dL (ref 6.5–8.1)

## 2022-11-08 MED ORDER — LANREOTIDE ACETATE 120 MG/0.5ML ~~LOC~~ SOLN
120.0000 mg | Freq: Once | SUBCUTANEOUS | Status: AC
  Administered 2022-11-08: 120 mg via SUBCUTANEOUS
  Filled 2022-11-08: qty 120

## 2022-11-08 NOTE — Progress Notes (Signed)
[facility] Telephone:([phone]) [phone]   [phone]  PROGRESS NOTE  Patient Care Team: Babs Sciara, MD as PCP - General (Family Medicine) Jonelle Sidle, MD as PCP - Cardiology (Cardiology)  Hematological/Oncological History # Well Differentiated Neuroendocrine Tumor, GI origin. Grade 2.  11/29/2021: Patient underwent CT angiogram C/A/P prior to consideration of TAVR for severe aortic stenosis, noted to have bulky central mesenteric soft tissue mass that has increased in 2019.  Additionally there was new widespread mild to moderate retroperitoneal, retrocrural, mediastinal and bilateral hilar lymphadenopathy 12/15/2021: NM PET CT Scan showed hypermetabolic mesenteric mass with hypermetabolic adenopathy in the small bowel mesentery, abdominal retroperitoneum and retrocrural stations. 12/28/2021: IR guided biopsy of the mesenteric mass was consistent with well differentiated neuroendocrine tumor 01/10/2022: start of lanreotide 120mg  IM q 28 days.   Interval History:  Sabrina Deleon 72 y.o. female with medical history significant for newly diagnosed neuroendocrine tumor who presents for a follow up visit. The patient's last visit was on 08/16/2022. In the interim since the last visit she continued lanreotide treatment.  On exam today Sabrina Deleon reports he has been doing well overall in the interim since her last visit.  She reports her energy is about a 9 out of 10 in the morning but she does get more sleepy in the afternoon.  She reports he does her best to try to stay busy.  She notes that overall she is not having any side effects as a result of her lanreotide shots.  She is not having any nausea, vomiting, or diarrhea.  She is not having much in the way of pain or discomfort.  Her appetite remains strong and she is eating well with a stable weight.  Overall she is tolerating lanreotide shots well and is willing and able to proceed with them at this time.  She denies any  fevers, chills, sweats, nausea, vomiting or diarrhea. Full 10 point ROS was otherwise negative.   MEDICAL HISTORY:  Past Medical History:  Diagnosis Date   Anxiety    Aortic stenosis    Arthritis    Chronic kidney disease    Dr. Salem Caster- noted increase kidney function levels- took pt off Metformin and started on Onglyza for Blood sugar control-pt being followed for this.   GERD (gastroesophageal reflux disease)    Gout    Hypertension    Hypothyroidism    Morbid obesity (HCC) 03/29/2018   Patient has elevated BMI with diabetes hyperlipidemia and hypertension   Pancreatitis    Type 2 diabetes mellitus (HCC)    Wears glasses    Wears partial dentures    Upper    SURGICAL HISTORY: Past Surgical History:  Procedure Laterality Date   BACK SURGERY     Lumbar   CARPAL TUNNEL RELEASE  11/02/2011   Procedure: CARPAL TUNNEL RELEASE;  Surgeon: Tami Ribas, MD;  Location: [facility];  Service: Orthopedics;  Laterality: Right;   CARPAL TUNNEL RELEASE Left 01/14/2015   Procedure: LEFT CARPAL TUNNEL RELEASE;  Surgeon: Betha Loa, MD;  Location: [facility];  Service: Orthopedics;  Laterality: Left;   CHOLECYSTECTOMY     laparoscopic   COLONOSCOPY     COLONOSCOPY N/A 11/12/2015   Procedure: COLONOSCOPY;  Surgeon: Malissa Hippo, MD;  Location: [facility];  Service: Endoscopy;  Laterality: N/A;  9:30   ESOPHAGOGASTRODUODENOSCOPY N/A 11/12/2015   Procedure: ESOPHAGOGASTRODUODENOSCOPY (EGD);  Surgeon: Malissa Hippo, MD;  Location: [facility];  Service:  Endoscopy;  Laterality: N/A;   REVERSE SHOULDER ARTHROPLASTY Right 11/04/2018   REVERSE SHOULDER ARTHROPLASTY Right 11/04/2018   Procedure: REVERSE SHOULDER ARTHROPLASTY;  Surgeon: Ernest Mallick, MD;  Location: [facility];  Service: Orthopedics;  Laterality: Right;   RIGHT/LEFT HEART CATH AND CORONARY ANGIOGRAPHY N/A 10/18/2017   Procedure: RIGHT/LEFT HEART CATH AND CORONARY ANGIOGRAPHY;  Surgeon:  Kathleene Hazel, MD;  Location: [facility];  Service: Cardiovascular;  Laterality: N/A;   TOTAL HIP ARTHROPLASTY Left 03/22/2016   Procedure: LEFT TOTAL HIP ARTHROPLASTY ANTERIOR APPROACH;  Surgeon: Ollen Gross, MD;  Location: [facility];  Service: Orthopedics;  Laterality: Left;    SOCIAL HISTORY: Social History   Socioeconomic History   Marital status: Married    Spouse name: Not on file   Number of children: Not on file   Years of education: Not on file   Highest education level: Some college, no degree  Occupational History   Not on file  Tobacco Use   Smoking status: Former    Current packs/day: 0.00    Types: Cigarettes    Quit date: 10/29/1981    Years since quitting: 41.0   Smokeless tobacco: Never  Vaping Use   Vaping status: Never Used  Substance and Sexual Activity   Alcohol use: Yes    Comment: occasionally   Drug use: No   Sexual activity: Not Currently  Other Topics Concern   Not on file  Social History Narrative   Not on file   Social Determinants of Health   Financial Resource Strain: Patient Declined (07/26/2022)   Overall Financial Resource Strain (CARDIA)    Difficulty of Paying Living Expenses: Patient declined  Food Insecurity: No Food Insecurity (07/26/2022)   Hunger Vital Sign    Worried About Running Out of Food in the Last Year: Never true    Ran Out of Food in the Last Year: Never true  Transportation Needs: No Transportation Needs (07/26/2022)   PRAPARE - Administrator, Civil Service (Medical): No    Lack of Transportation (Non-Medical): No  Physical Activity: Insufficiently Active (07/26/2022)   Exercise Vital Sign    Days of Exercise per Week: 2 days    Minutes of Exercise per Session: 20 min  Stress: No Stress Concern Present (07/26/2022)   [employer] of Occupational Health - Occupational Stress Questionnaire    Feeling of Stress : Only a little  Social Connections: Socially Integrated (07/26/2022)   Social  Connection and Isolation Panel [NHANES]    Frequency of Communication with Friends and Family: More than three times a week    Frequency of Social Gatherings with Friends and Family: More than three times a week    Attends Religious Services: More than 4 times per year    Active Member of [employer] or Organizations: Yes    Attends Engineer, structural: More than 4 times per year    Marital Status: Married  Catering manager Violence: Not on file    FAMILY HISTORY: Family History  Problem Relation Age of Onset   Breast cancer Mother    Lung cancer Mother        likely recurrent breast cancer   Breast cancer Paternal Grandmother    Head & neck cancer Nephew     ALLERGIES:  is allergic to bee venom.  MEDICATIONS:  Current Outpatient Medications  Medication Sig Dispense Refill   allopurinol (ZYLOPRIM) 100 MG tablet TAKE 1 TABLET(100 MG) BY MOUTH TWICE DAILY 180 tablet  1   aspirin EC 81 MG tablet Take 81 mg by mouth at bedtime.     Cholecalciferol (VITAMIN D) 50 MCG (2000 UT) tablet Take 2,000 Units by mouth daily.     citalopram (CELEXA) 20 MG tablet TAKE 1 TABLET(20 MG) BY MOUTH DAILY 90 tablet 1   clonazePAM (KLONOPIN) 0.5 MG tablet 1 nightly as needed insomnia 30 tablet 5   Colchicine 0.6 MG CAPS Take one cap po BID prn gout flare up 30 capsule 0   dicyclomine (BENTYL) 10 MG capsule Take 1 capsule (10 mg total) by mouth 3 (three) times daily before meals. 90 capsule 0   ferrous sulfate 325 (65 FE) MG tablet Take 325 mg by mouth daily with breakfast.     furosemide (LASIX) 40 MG tablet TAKE 1 TABLET(40 MG) BY MOUTH DAILY 90 tablet 1   ketoconazole (NIZORAL) 2 % cream Apply 1 Application topically daily as needed (fungus). 60 g 0   levothyroxine (SYNTHROID) 125 MCG tablet TAKE 1 TABLET(125 MCG) BY MOUTH DAILY BEFORE BREAKFAST 90 tablet 1   lisinopril (ZESTRIL) 2.5 MG tablet Take 1 tablet (2.5 mg total) by mouth daily. 90 tablet 1   MISC NATURAL PRODUCTS PO Take 1 tablet by mouth  daily. Super Beets otc supplement     ondansetron (ZOFRAN) 8 MG tablet Take 1 tablet (8 mg total) by mouth every 8 (eight) hours as needed. 30 tablet 0   ONETOUCH ULTRA test strip TEST ONCE DAILY 50 strip 5   pantoprazole (PROTONIX) 40 MG tablet TAKE 1 TABLET(40 MG) BY MOUTH DAILY (Patient taking differently: Take 40 mg by mouth every other day.) 90 tablet 1   potassium chloride (KLOR-CON M) 10 MEQ tablet TAKE 1 TABLET BY MOUTH EVERY DAY ON MONDAY, WEDNESDAY AND FRIDAY 14 tablet 6   rosuvastatin (CRESTOR) 10 MG tablet TAKE 1 TABLET(10 MG) BY MOUTH DAILY FOR CHOLESTEROL. STOP WHILE TAKING COLCHICINE FOR GOUT 90 tablet 1   sitaGLIPtin (JANUVIA) 50 MG tablet Take 1 tablet (50 mg total) by mouth daily. 30 tablet 4   No current facility-administered medications for this visit.    REVIEW OF SYSTEMS:   Constitutional: ( - ) fevers, ( - )  chills , ( - ) night sweats Eyes: ( - ) blurriness of vision, ( - ) double vision, ( - ) watery eyes Ears, nose, mouth, throat, and face: ( - ) mucositis, ( - ) sore throat Respiratory: ( - ) cough, ( - ) dyspnea, ( - ) wheezes Cardiovascular: ( - ) palpitation, ( - ) chest discomfort, ( - ) lower extremity swelling Gastrointestinal:  ( - ) nausea, ( - ) heartburn, ( - ) change in bowel habits Skin: ( - ) abnormal skin rashes Lymphatics: ( - ) new lymphadenopathy, ( - ) easy bruising Neurological: ( - ) numbness, ( - ) tingling, ( - ) new weaknesses Behavioral/Psych: ( - ) mood change, ( - ) new changes  All other systems were reviewed with the patient and are negative.  PHYSICAL EXAMINATION: ECOG PERFORMANCE STATUS: 1 - Symptomatic but completely ambulatory  Vitals:   11/08/22 1203  BP: (!) 140/51  Pulse: (!) 57  Resp: 16  Temp: 97.7 F (36.5 C)  SpO2: 100%    Filed Weights   11/08/22 1203  Weight: 164 lb 9.6 oz (74.7 kg)     GENERAL: Well-appearing elderly Caucasian female, alert, no distress and comfortable SKIN: skin color, texture, turgor  are normal, no rashes or significant lesions  EYES: conjunctiva are pink and non-injected, sclera clear LUNGS: clear to auscultation and percussion with normal breathing effort HEART: regular rate & rhythm and no murmurs and no lower extremity edema Musculoskeletal: no cyanosis of digits and no clubbing  PSYCH: alert & oriented x 3, fluent speech NEURO: no focal motor/sensory deficits  LABORATORY DATA:  I have reviewed the data as listed    Latest Ref Rng & Units 11/08/2022   11:08 AM 10/11/2022   11:00 AM 09/13/2022   11:02 AM  CBC  WBC 4.0 - 10.5 K/uL 6.1  6.9  5.8   Hemoglobin 12.0 - 15.0 g/dL 16.1  09.6  04.5   Hematocrit 36.0 - 46.0 % 35.5  34.3  34.0   Platelets 150 - 400 K/uL 150  136  157        Latest Ref Rng & Units 11/08/2022   11:08 AM 10/11/2022   11:00 AM 09/13/2022   11:02 AM  CMP  Glucose 70 - 99 mg/dL 409  811  914   BUN 8 - 23 mg/dL 26  26  26    Creatinine 0.44 - 1.00 mg/dL 7.82  9.56  2.13   Sodium 135 - 145 mmol/L 139  138  139   Potassium 3.5 - 5.1 mmol/L 4.9  4.9  4.5   Chloride 98 - 111 mmol/L 102  103  102   CO2 22 - 32 mmol/L 36  32  34   Calcium 8.9 - 10.3 mg/dL 08.6  57.8  46.9   Total Protein 6.5 - 8.1 g/dL 7.5  6.9  7.5   Total Bilirubin 0.3 - 1.2 mg/dL 0.6  0.5  0.8   Alkaline Phos 38 - 126 U/L 75  76  68   AST 15 - 41 U/L 59  53  73   ALT 0 - 44 U/L 33  27  41    RADIOGRAPHIC STUDIES: No results found.  ASSESSMENT & PLAN Sabrina Deleon 72 y.o. female with medical history significant for newly diagnosed neuroendocrine tumor who presents for a follow up visit.  #Metastatic Well Differentiated Neuroendocrine Tumor of the GI Tract -- CT imaging and PET CT scan confirmed widespread lymphadenopathy consistent with metastatic spread of well-differentiated neuroendocrine tumor. -- Biopsy confirms this is a well differentiated neuroendocrine tumor of the GI tract. -- Today we will proceed with lanreotide 120 mg IM q. 28 days. --labs today show white  blood cell 6.1, hemoglobin 9.2, MCV 94.7, and platelets of 150 --Due for repeat CT imaging to confirm treatment response. We will order and try to schedule over the next 4 weeks. Currently scheduled on 11/30/2022.  If stable continue every 6 month imaging -- Have patient return to clinic in 12 weeks time with interval monthly shots.    #Hyperglycemia: -- Appears to be under better control at this time.  Orders Placed This Encounter  Procedures   CT CHEST ABDOMEN PELVIS W CONTRAST    Standing Status:   Future    Standing Expiration Date:   11/08/2023    Order Specific Question:   If indicated for the ordered procedure, I authorize the administration of contrast media per Radiology protocol    Answer:   Yes    Order Specific Question:   Does the patient have a contrast media/X-ray dye allergy?    Answer:   No    Order Specific Question:   Preferred imaging location?    Answer:   [facility] [place]    Order  Specific Question:   If indicated for the ordered procedure, I authorize the administration of oral contrast media per Radiology protocol    Answer:   Yes    All questions were answered. The patient knows to call the clinic with any problems, questions or concerns.  I have spent a total of 30 minutes minutes of face-to-face and non-face-to-face time, preparing to see the patient, performing a medically appropriate examination, counseling and educating the patient, ordering tests/procedures, referring and communicating with other health care professionals, documenting clinical information in the electronic health record, and care coordination.   Ulysees Barns, MD Department of Hematology/Oncology [facility] at [facility] Phone: [phone] Pager: [phone] Email: [email or web address]  11/18/2022 12:09 PM

## 2022-11-10 ENCOUNTER — Encounter (HOSPITAL_COMMUNITY): Payer: Self-pay | Admitting: Emergency Medicine

## 2022-11-10 ENCOUNTER — Emergency Department (HOSPITAL_COMMUNITY)
Admission: EM | Admit: 2022-11-10 | Discharge: 2022-11-10 | Disposition: A | Discharge status: Home / Self Care | Payer: Medicare Other | Attending: Emergency Medicine | Admitting: Emergency Medicine

## 2022-11-10 ENCOUNTER — Other Ambulatory Visit: Payer: Self-pay

## 2022-11-10 DIAGNOSIS — Z79899 Other long term (current) drug therapy: Secondary | ICD-10-CM | POA: Diagnosis not present

## 2022-11-10 DIAGNOSIS — R040 Epistaxis: Secondary | ICD-10-CM | POA: Insufficient documentation

## 2022-11-10 DIAGNOSIS — Z7982 Long term (current) use of aspirin: Secondary | ICD-10-CM | POA: Insufficient documentation

## 2022-11-10 MED ORDER — LIDOCAINE HCL (PF) 2 % IJ SOLN
INTRAMUSCULAR | Status: AC
  Administered 2022-11-10: 10 mL
  Filled 2022-11-10: qty 10

## 2022-11-10 MED ORDER — LIDOCAINE HCL (PF) 2 % IJ SOLN: 10.0000 mL | Freq: Once | INTRAMUSCULAR | Status: AC

## 2022-11-10 MED ORDER — OXYMETAZOLINE HCL 0.05 % NA SOLN
1.0000 | Freq: Once | NASAL | Status: AC
  Administered 2022-11-10: 1 via NASAL
  Filled 2022-11-10: qty 30

## 2022-11-10 MED ORDER — SILVER NITRATE-POT NITRATE 75-25 % EX MISC
CUTANEOUS | Status: AC
  Filled 2022-11-10: qty 10

## 2022-11-10 MED ORDER — LIDOCAINE HCL 4 % EX SOLN
Freq: Once | CUTANEOUS | Status: DC
  Filled 2022-11-10: qty 50

## 2022-11-10 NOTE — ED Provider Notes (Signed)
[place] EMERGENCY DEPARTMENT AT [facility] Provider Note   CSN: [id number] Arrival date & time: 11/10/22  1933     History  Chief Complaint  Patient presents with   Epistaxis    Sabrina Deleon is a 72 y.o. female.   Epistaxis Patient presents with nosebleed.  Has been bleeding since 5:00.  Coming primary out of left nostril but did have some out of the right.  Did have some gone the back of her throat.  Not on blood thinners but just aspirin.  He is apparently on a cancer infusion that she got yesterday that can cause nosebleeds.     Home Medications Prior to Admission medications   Medication Sig Start Date End Date Taking? Authorizing Provider  allopurinol (ZYLOPRIM) 100 MG tablet TAKE 1 TABLET(100 MG) BY MOUTH TWICE DAILY 09/12/22   Babs Sciara, MD  aspirin EC 81 MG tablet Take 81 mg by mouth at bedtime.    [provider]  Cholecalciferol (VITAMIN D) 50 MCG (2000 UT) tablet Take 2,000 Units by mouth daily.    [provider]  citalopram (CELEXA) 20 MG tablet TAKE 1 TABLET(20 MG) BY MOUTH DAILY 10/16/22   Babs Sciara, MD  clonazePAM (KLONOPIN) 0.5 MG tablet 1 nightly as needed insomnia 07/27/22   Babs Sciara, MD  Colchicine 0.6 MG CAPS Take one cap po BID prn gout flare up 09/22/22   Campbell Riches, NP  dicyclomine (BENTYL) 10 MG capsule Take 1 capsule (10 mg total) by mouth 3 (three) times daily before meals. 01/20/22   Briant Cedar, PA-C  ferrous sulfate 325 (65 FE) MG tablet Take 325 mg by mouth daily with breakfast.    [provider]  furosemide (LASIX) 40 MG tablet TAKE 1 TABLET(40 MG) BY MOUTH DAILY 10/16/22   Babs Sciara, MD  ketoconazole (NIZORAL) 2 % cream Apply 1 Application topically daily as needed (fungus). 07/27/22   Babs Sciara, MD  levothyroxine (SYNTHROID) 125 MCG tablet TAKE 1 TABLET(125 MCG) BY MOUTH DAILY BEFORE BREAKFAST 06/06/22   Lilyan Punt A, MD  lisinopril (ZESTRIL) 2.5 MG tablet Take 1 tablet  (2.5 mg total) by mouth daily. 08/06/22   Campbell Riches, NP  MISC NATURAL PRODUCTS PO Take 1 tablet by mouth daily. Super Beets otc supplement    [provider]  ondansetron (ZOFRAN) 8 MG tablet Take 1 tablet (8 mg total) by mouth every 8 (eight) hours as needed. 01/10/22   Jaci Standard, MD  [facility] ULTRA test strip TEST ONCE DAILY 05/29/22   Babs Sciara, MD  pantoprazole (PROTONIX) 40 MG tablet TAKE 1 TABLET(40 MG) BY MOUTH DAILY Patient taking differently: Take 40 mg by mouth every other day. 03/08/21   Babs Sciara, MD  potassium chloride (KLOR-CON M) 10 MEQ tablet TAKE 1 TABLET BY MOUTH EVERY DAY ON MONDAY, [facility] AND FRIDAY 05/12/22   Babs Sciara, MD  rosuvastatin (CRESTOR) 10 MG tablet TAKE 1 TABLET(10 MG) BY MOUTH DAILY FOR CHOLESTEROL. STOP WHILE TAKING COLCHICINE FOR GOUT 07/28/22   Babs Sciara, MD  sitaGLIPtin (JANUVIA) 50 MG tablet Take 1 tablet (50 mg total) by mouth daily. 07/11/22   Babs Sciara, MD      Allergies    Bee venom    Review of Systems   Review of Systems  HENT:  Positive for nosebleeds.     Physical Exam Updated Vital Signs BP (!) 152/39 (BP Location: Right  Arm)   Pulse (!) 51   Temp 98.2 F (36.8 C) (Oral)   Resp 16   Ht 5\' 8"  (1.727 m)   Wt 75 kg   SpO2 100%   BMI 25.14 kg/m  Physical Exam Vitals and nursing note reviewed.  HENT:     Nose:     Comments: Dried blood in both nares.  More moist blood on the left.  May be slight oozing but not large amount of active bleeding. Cardiovascular:     Rate and Rhythm: Normal rate.  Skin:    Coloration: Skin is not pale.  Neurological:     Mental Status: She is alert.     ED Results / Procedures / Treatments   Labs (all labs ordered are listed, but only abnormal results are displayed) Labs Reviewed - No data to display  EKG None  Radiology No results found.  Procedures Procedures    Medications Ordered in ED Medications  oxymetazoline (AFRIN) 0.05 %  nasal spray 1 spray (1 spray Each Nare Given 11/10/22 1958)  lidocaine HCl (PF) (XYLOCAINE) 2 % injection 10 mL (10 mLs Other Given 11/10/22 2107)    ED Course/ Medical Decision Making/ A&P                                 Medical Decision Making Risk OTC drugs. Prescription drug management.   Patient with nosebleed.  Likely anterior.  Likely left side.  Afrin and lidocaine placed on nose and no active bleeding seen or area where it was definitely bleeding.  Bleeding had stopped.  Reviewed recent blood work do not think we need to recheck at this time with reassuring vitals.  ENT follow-up as needed.  Will discharge home.  Yeah sure how        Final Clinical Impression(s) / ED Diagnoses Final diagnoses:  Left-sided epistaxis    Rx / DC Orders ED Discharge Orders     None         Benjiman Core, MD 11/10/22 2154

## 2022-11-10 NOTE — ED Triage Notes (Addendum)
Pt with L sided nose bleed since 1715. Pt states she takes an ASA daily. Pt states she just had her "cancer shot-Lanreotide" yesterday.

## 2022-11-10 NOTE — ED Notes (Signed)
ENT tray at bedside.

## 2022-11-18 ENCOUNTER — Encounter: Payer: Self-pay | Admitting: Hematology and Oncology

## 2022-11-21 ENCOUNTER — Other Ambulatory Visit: Payer: Medicare Other

## 2022-11-22 ENCOUNTER — Telehealth: Payer: Self-pay

## 2022-11-22 NOTE — Telephone Encounter (Signed)
Transition Care Management Unsuccessful Follow-up Telephone Call  Date of discharge and from where:  11/10/2022 [name]  Attempts:  1st Attempt  Reason for unsuccessful TCM follow-up call:  Left voice message  Jafari Mckillop [facility]  [facility] Population Health Community Resource Care Guide   [email or web address]  ?? [phone]   Website: [email or web address]  [email or web address]

## 2022-11-22 NOTE — Telephone Encounter (Signed)
Pt needs blood work ordered A1C went to lab and nothing on file can this blood work be ordered for patient she has appt for 09/09 this coming Monday with Dr Bronson Ing [phone]

## 2022-11-23 ENCOUNTER — Telehealth: Payer: Self-pay

## 2022-11-23 NOTE — Telephone Encounter (Signed)
Transition Care Management Unsuccessful Follow-up Telephone Call  Date of discharge and from where:  11/10/2022 [facility]  Attempts:  2nd Attempt  Reason for unsuccessful TCM follow-up call:  Left voice message  Susan Bleich [facility]  [facility] Population Health Community Resource Care Guide   [email or web address]  ?? [phone]   Website: [email or web address]  [email or web address]

## 2022-11-23 NOTE — Telephone Encounter (Signed)
She is due for lipid, A1c, TSH free T4  Diagnosis hypothyroidism, hyperlipidemia, diabetes

## 2022-11-24 ENCOUNTER — Other Ambulatory Visit: Payer: Self-pay

## 2022-11-24 DIAGNOSIS — E785 Hyperlipidemia, unspecified: Secondary | ICD-10-CM

## 2022-11-24 DIAGNOSIS — E039 Hypothyroidism, unspecified: Secondary | ICD-10-CM

## 2022-11-24 DIAGNOSIS — N1832 Chronic kidney disease, stage 3b: Secondary | ICD-10-CM

## 2022-11-24 NOTE — Telephone Encounter (Signed)
Informed pt of labs being ordered

## 2022-11-27 ENCOUNTER — Ambulatory Visit (INDEPENDENT_AMBULATORY_CARE_PROVIDER_SITE_OTHER): Payer: Medicare Other | Admitting: Family Medicine

## 2022-11-27 VITALS — BP 112/64 | HR 56 | Temp 98.2°F | Ht 68.0 in | Wt 162.2 lb

## 2022-11-27 DIAGNOSIS — N1832 Chronic kidney disease, stage 3b: Secondary | ICD-10-CM

## 2022-11-27 DIAGNOSIS — E1122 Type 2 diabetes mellitus with diabetic chronic kidney disease: Secondary | ICD-10-CM | POA: Diagnosis not present

## 2022-11-27 DIAGNOSIS — Z23 Encounter for immunization: Secondary | ICD-10-CM

## 2022-11-27 DIAGNOSIS — E039 Hypothyroidism, unspecified: Secondary | ICD-10-CM | POA: Diagnosis not present

## 2022-11-27 DIAGNOSIS — E785 Hyperlipidemia, unspecified: Secondary | ICD-10-CM

## 2022-11-27 DIAGNOSIS — F419 Anxiety disorder, unspecified: Secondary | ICD-10-CM | POA: Diagnosis not present

## 2022-11-27 DIAGNOSIS — F32A Depression, unspecified: Secondary | ICD-10-CM

## 2022-11-27 MED ORDER — IPRATROPIUM BROMIDE 0.03 % NA SOLN: 2.0000 | Freq: Two times a day (BID) | NASAL | 12 refills | Status: DC

## 2022-11-27 MED ORDER — DULOXETINE HCL 30 MG PO CPEP: 30.0000 mg | ORAL_CAPSULE | Freq: Every day | ORAL | 3 refills | Status: DC

## 2022-11-27 NOTE — Progress Notes (Signed)
   Subjective:    Patient ID: Sabrina Deleon, female    DOB: 03/08/1951, 72 y.o.   MRN: [id number]  HPI Pt comes in today for 4 month follow up.  At times finds himself stressed anxious sometimes a little bit depressed She is a person of faith and she is doing well in that regards The uncertainties of cancer and also how the medication medications make her feel create situations where she feels rundown She is not suicidal We did discuss how she also has a lot of musculoskeletal pain associated with her condition. Pt would like to discuss anxiety and depression screening sheet.  Patient has thyroid condition.    Compliance-good compliance  Recent lab-did lab work today  Any related symptoms-no symptoms  The patient was seen today as part of a comprehensive diabetic check up. Patient has diabetes Patient relates good compliance with taking the medication. We discussed their diet and exercise activities  We also discussed the importance of notifying us if any excessively high glucoses or low sugars.      Review of Systems     Objective:   Physical Exam General-in no acute distress Eyes-no discharge Lungs-respiratory rate normal, CTA CV-no murmurs,RRR Extremities skin warm dry no edema Neuro grossly normal Behavior normal, alert  Patient did do lab work we will await the results of this      Assessment & Plan:   1. Immunization due Today - Flu Vaccine Trivalent High Dose (Fluad)  2. Hypothyroidism, unspecified type Await lab work continue medication  3. Type 2 diabetes mellitus with stage 3b chronic kidney disease, without long-term current use of insulin (HCC) Continue medication Await lab work The patient was seen today as part of a comprehensive visit for diabetes. The importance of keeping her A1c at or below 7 range was discussed.  Discussed diet, activity, and medication compliance Emphasized healthy eating primarily with vegetables fruits and if utilizing  meats lean meats such as chicken or fish grilled baked broiled Avoid sugary drinks Minimize and avoid processed foods Fit in regular physical activity preferably 25 to 30 minutes 4 times per week Standard follow-up visit recommended.  Patient aware lack of control and follow-up increases risk of diabetic complications. Regular follow-up visits Yearly ophthalmology Yearly foot exam   4. Hyperlipidemia, unspecified hyperlipidemia type Await lab work healthy diet  5. Anxiety and depression Switch away from Celexa utilize Cymbalta 30 mg follow-up in 4 to 6 weeks Give [place] feedback in a few weeks time how that is doing Consider counseling if worse

## 2022-11-28 LAB — LIPID PANEL
Chol/HDL Ratio: 1.9 ratio (ref 0.0–4.4)
Cholesterol, Total: 111 mg/dL (ref 100–199)
HDL: 57 mg/dL (ref >39)
LDL Chol Calc (NIH): 40 mg/dL (ref 0–99)
Triglycerides: 62 mg/dL (ref 0–149)
VLDL Cholesterol Cal: 14 mg/dL (ref 5–40)

## 2022-11-28 LAB — HEMOGLOBIN A1C
Est. average glucose Bld gHb Est-mCnc: 148 mg/dL
Hgb A1c MFr Bld: 6.8 % — ABNORMAL HIGH (ref 4.8–5.6)

## 2022-11-28 LAB — TSH+FREE T4
Free T4: 1.41 ng/dL (ref 0.82–1.77)
TSH: 0.819 u[IU]/mL (ref 0.450–4.500)

## 2022-11-30 ENCOUNTER — Ambulatory Visit (HOSPITAL_COMMUNITY)
Admission: RE | Admit: 2022-11-30 | Discharge: 2022-11-30 | Disposition: A | Discharge status: Home / Self Care | Payer: Medicare Other | Source: Ambulatory Visit | Attending: Hematology and Oncology | Admitting: Hematology and Oncology

## 2022-11-30 ENCOUNTER — Other Ambulatory Visit: Payer: Self-pay | Admitting: Family Medicine

## 2022-11-30 DIAGNOSIS — C7A8 Other malignant neuroendocrine tumors: Secondary | ICD-10-CM | POA: Insufficient documentation

## 2022-11-30 DIAGNOSIS — E041 Nontoxic single thyroid nodule: Secondary | ICD-10-CM | POA: Diagnosis not present

## 2022-11-30 DIAGNOSIS — R1909 Other intra-abdominal and pelvic swelling, mass and lump: Secondary | ICD-10-CM | POA: Diagnosis not present

## 2022-11-30 DIAGNOSIS — N289 Disorder of kidney and ureter, unspecified: Secondary | ICD-10-CM | POA: Diagnosis not present

## 2022-11-30 MED ORDER — IOHEXOL 300 MG/ML  SOLN
100.0000 mL | Freq: Once | INTRAMUSCULAR | Status: AC | PRN
  Administered 2022-11-30: 100 mL via INTRAVENOUS

## 2022-11-30 MED ORDER — SODIUM CHLORIDE (PF) 0.9 % IJ SOLN
INTRAMUSCULAR | Status: AC
  Filled 2022-11-30: qty 50

## 2022-11-30 MED ORDER — IOHEXOL 9 MG/ML PO SOLN
500.0000 mL | ORAL | Status: AC
  Administered 2022-11-30 (×2): 500 mL via ORAL

## 2022-12-01 ENCOUNTER — Telehealth: Payer: Self-pay | Admitting: *Deleted

## 2022-12-01 NOTE — Telephone Encounter (Signed)
Received call from pt inquiring about 2 of her medications. She is asking about pantoprozole and dicyclomine.. sghe is asking if it is ok for her to take both of these. Advised that it is ok to take both. One is for reflux, acid reducer for her stomach and the other is for intestinal spasm. She states she doesn't take the dicyclomine very often. Her PCP has ordered the pantoprozole to be taken every other day. Pt voiced understanding.

## 2022-12-01 NOTE — Telephone Encounter (Signed)
-----   Message from Ulysees Barns IV sent at 11/30/2022  4:33 PM EDT ----- Please let Ms. Alcide know that her CT scan showed stable disease.  The neuroendocrine tumor does not appear to have increased in size or decreased in size.  It is overall stable.  This is a good finding.  I would recommend we continue our current therapy and continue to monitor. ----- Message ----- From: Interface, Rad Results In Sent: 11/30/2022  12:10 PM EDT To: Jaci Standard, MD

## 2022-12-01 NOTE — Telephone Encounter (Signed)
TCT patient regarding recent scan results. Spoke with her . Advised that her CT scan showed stable disease. The neuroendocrine tumor does not appear to have increased in size or decreased in size. It is overall stable. This is a good finding. Dr. Leonides Schanz recommends we continue our current therapy and continue to monitor. Pt is pleased with results and is agreeable to continuing her current treatment plan. No other questions or concerns.

## 2022-12-04 ENCOUNTER — Telehealth: Payer: Self-pay

## 2022-12-04 MED ORDER — PANTOPRAZOLE SODIUM 40 MG PO TBEC: DELAYED_RELEASE_TABLET | ORAL | 1 refills | Status: DC

## 2022-12-04 NOTE — Telephone Encounter (Signed)
Prescription Request  12/04/2022  LOV: Visit date not found  What is the name of the medication or equipment? pantoprazole (PROTONIX) 40 MG tablet   Have you contacted your pharmacy to request a refill? Yes   Which pharmacy would you like this sent to?  [facility] [phone] - [place], [place] - [address] AT [facility] [address] [place] [address] Phone: [phone] Fax: [phone]    Patient notified that their request is being sent to the clinical staff for review and that they should receive a response within 2 business days.   Please advise at Mobile [phone] (mobile)

## 2022-12-06 ENCOUNTER — Inpatient Hospital Stay: Payer: Medicare Other | Attending: Physician Assistant

## 2022-12-06 ENCOUNTER — Inpatient Hospital Stay: Payer: Medicare Other

## 2022-12-06 VITALS — BP 139/51 | HR 51 | Temp 98.1°F | Resp 18

## 2022-12-06 DIAGNOSIS — C7B01 Secondary carcinoid tumors of distant lymph nodes: Secondary | ICD-10-CM | POA: Diagnosis not present

## 2022-12-06 DIAGNOSIS — C7A00 Malignant carcinoid tumor of unspecified site: Secondary | ICD-10-CM | POA: Diagnosis not present

## 2022-12-06 DIAGNOSIS — C7A8 Other malignant neuroendocrine tumors: Secondary | ICD-10-CM

## 2022-12-06 LAB — CMP (CANCER CENTER ONLY)
ALT: 17 U/L (ref 0–44)
AST: 42 U/L — ABNORMAL HIGH (ref 15–41)
Albumin: 4.2 g/dL (ref 3.5–5.0)
Alkaline Phosphatase: 68 U/L (ref 38–126)
Anion gap: 4 — ABNORMAL LOW (ref 5–15)
BUN: 21 mg/dL (ref 8–23)
CO2: 33 mmol/L — ABNORMAL HIGH (ref 22–32)
Calcium: 10.4 mg/dL — ABNORMAL HIGH (ref 8.9–10.3)
Chloride: 102 mmol/L (ref 98–111)
Creatinine: 1.15 mg/dL — ABNORMAL HIGH (ref 0.44–1.00)
GFR, Estimated: 51 mL/min — ABNORMAL LOW (ref >60)
Glucose, Bld: 137 mg/dL — ABNORMAL HIGH (ref 70–99)
Potassium: 4.5 mmol/L (ref 3.5–5.1)
Sodium: 139 mmol/L (ref 135–145)
Total Bilirubin: 0.7 mg/dL (ref 0.3–1.2)
Total Protein: 7.2 g/dL (ref 6.5–8.1)

## 2022-12-06 LAB — CBC WITH DIFFERENTIAL (CANCER CENTER ONLY)
Abs Immature Granulocytes: 0.01 10*3/uL (ref 0.00–0.07)
Basophils Absolute: 0 10*3/uL (ref 0.0–0.1)
Basophils Relative: 1 %
Eosinophils Absolute: 0.3 10*3/uL (ref 0.0–0.5)
Eosinophils Relative: 5 %
HCT: 31.4 % — ABNORMAL LOW (ref 36.0–46.0)
Hemoglobin: 10.3 g/dL — ABNORMAL LOW (ref 12.0–15.0)
Immature Granulocytes: 0 %
Lymphocytes Relative: 20 %
Lymphs Abs: 1.2 10*3/uL (ref 0.7–4.0)
MCH: 30.6 pg (ref 26.0–34.0)
MCHC: 32.8 g/dL (ref 30.0–36.0)
MCV: 93.2 fL (ref 80.0–100.0)
Monocytes Absolute: 0.4 10*3/uL (ref 0.1–1.0)
Monocytes Relative: 7 %
Neutro Abs: 4 10*3/uL (ref 1.7–7.7)
Neutrophils Relative %: 67 %
Platelet Count: 148 10*3/uL — ABNORMAL LOW (ref 150–400)
RBC: 3.37 MIL/uL — ABNORMAL LOW (ref 3.87–5.11)
RDW: 13.9 % (ref 11.5–15.5)
WBC Count: 6.1 10*3/uL (ref 4.0–10.5)
nRBC: 0 % (ref 0.0–0.2)

## 2022-12-06 MED ORDER — LANREOTIDE ACETATE 120 MG/0.5ML ~~LOC~~ SOLN
120.0000 mg | Freq: Once | SUBCUTANEOUS | Status: AC
  Administered 2022-12-06: 120 mg via SUBCUTANEOUS
  Filled 2022-12-06: qty 120

## 2022-12-07 LAB — CHROMOGRANIN A: Chromogranin A (ng/mL): 131 ng/mL — ABNORMAL HIGH (ref 0.0–101.8)

## 2022-12-11 ENCOUNTER — Ambulatory Visit: Payer: Medicare Other | Attending: Cardiology

## 2022-12-11 ENCOUNTER — Ambulatory Visit (INDEPENDENT_AMBULATORY_CARE_PROVIDER_SITE_OTHER): Payer: Medicare Other

## 2022-12-11 DIAGNOSIS — I350 Nonrheumatic aortic (valve) stenosis: Secondary | ICD-10-CM | POA: Diagnosis not present

## 2022-12-11 DIAGNOSIS — I6523 Occlusion and stenosis of bilateral carotid arteries: Secondary | ICD-10-CM

## 2022-12-11 LAB — ECHOCARDIOGRAM COMPLETE
AR max vel: 0.62 cm2
AV Area VTI: 0.64 cm2
AV Area mean vel: 0.65 cm2
AV Mean grad: 56.5 mmHg
AV Peak grad: 88.7 mmHg
Ao pk vel: 4.71 m/s
Area-P 1/2: 1.67 cm2
Calc EF: 60.6 %
MV M vel: 6.44 m/s
MV Peak grad: 165.9 mmHg
MV VTI: 0.96 cm2
P 1/2 time: 440 msec
S' Lateral: 3.3 cm
Single Plane A2C EF: 52.5 %
Single Plane A4C EF: 67.9 %

## 2022-12-13 ENCOUNTER — Other Ambulatory Visit: Payer: Self-pay | Admitting: Family Medicine

## 2022-12-13 DIAGNOSIS — E1122 Type 2 diabetes mellitus with diabetic chronic kidney disease: Secondary | ICD-10-CM

## 2022-12-27 ENCOUNTER — Other Ambulatory Visit: Payer: Self-pay | Admitting: Family Medicine

## 2023-01-03 ENCOUNTER — Inpatient Hospital Stay: Payer: Medicare Other

## 2023-01-04 ENCOUNTER — Inpatient Hospital Stay: Payer: Medicare Other

## 2023-01-04 ENCOUNTER — Inpatient Hospital Stay: Payer: Medicare Other | Attending: Physician Assistant

## 2023-01-04 ENCOUNTER — Other Ambulatory Visit: Payer: Self-pay

## 2023-01-04 VITALS — BP 140/88 | HR 56 | Resp 16

## 2023-01-04 DIAGNOSIS — C7B01 Secondary carcinoid tumors of distant lymph nodes: Secondary | ICD-10-CM | POA: Diagnosis not present

## 2023-01-04 DIAGNOSIS — C7A8 Other malignant neuroendocrine tumors: Secondary | ICD-10-CM

## 2023-01-04 DIAGNOSIS — C7A00 Malignant carcinoid tumor of unspecified site: Secondary | ICD-10-CM | POA: Insufficient documentation

## 2023-01-04 LAB — CBC WITH DIFFERENTIAL (CANCER CENTER ONLY)
Abs Immature Granulocytes: 0.01 10*3/uL (ref 0.00–0.07)
Basophils Absolute: 0 10*3/uL (ref 0.0–0.1)
Basophils Relative: 1 %
Eosinophils Absolute: 0.3 10*3/uL (ref 0.0–0.5)
Eosinophils Relative: 6 %
HCT: 32.7 % — ABNORMAL LOW (ref 36.0–46.0)
Hemoglobin: 10.7 g/dL — ABNORMAL LOW (ref 12.0–15.0)
Immature Granulocytes: 0 %
Lymphocytes Relative: 22 %
Lymphs Abs: 1.3 10*3/uL (ref 0.7–4.0)
MCH: 31.1 pg (ref 26.0–34.0)
MCHC: 32.7 g/dL (ref 30.0–36.0)
MCV: 95.1 fL (ref 80.0–100.0)
Monocytes Absolute: 0.5 10*3/uL (ref 0.1–1.0)
Monocytes Relative: 9 %
Neutro Abs: 3.5 10*3/uL (ref 1.7–7.7)
Neutrophils Relative %: 62 %
Platelet Count: 154 10*3/uL (ref 150–400)
RBC: 3.44 MIL/uL — ABNORMAL LOW (ref 3.87–5.11)
RDW: 13.6 % (ref 11.5–15.5)
WBC Count: 5.7 10*3/uL (ref 4.0–10.5)
nRBC: 0 % (ref 0.0–0.2)

## 2023-01-04 LAB — CMP (CANCER CENTER ONLY)
ALT: 35 U/L (ref 0–44)
AST: 63 U/L — ABNORMAL HIGH (ref 15–41)
Albumin: 4.3 g/dL (ref 3.5–5.0)
Alkaline Phosphatase: 79 U/L (ref 38–126)
Anion gap: 4 — ABNORMAL LOW (ref 5–15)
BUN: 31 mg/dL — ABNORMAL HIGH (ref 8–23)
CO2: 32 mmol/L (ref 22–32)
Calcium: 10.6 mg/dL — ABNORMAL HIGH (ref 8.9–10.3)
Chloride: 101 mmol/L (ref 98–111)
Creatinine: 1.41 mg/dL — ABNORMAL HIGH (ref 0.44–1.00)
GFR, Estimated: 40 mL/min — ABNORMAL LOW (ref >60)
Glucose, Bld: 146 mg/dL — ABNORMAL HIGH (ref 70–99)
Potassium: 4.9 mmol/L (ref 3.5–5.1)
Sodium: 137 mmol/L (ref 135–145)
Total Bilirubin: 0.7 mg/dL (ref 0.3–1.2)
Total Protein: 7.3 g/dL (ref 6.5–8.1)

## 2023-01-04 MED ORDER — LANREOTIDE ACETATE 120 MG/0.5ML ~~LOC~~ SOLN
120.0000 mg | Freq: Once | SUBCUTANEOUS | Status: AC
  Administered 2023-01-04: 120 mg via SUBCUTANEOUS
  Filled 2023-01-04: qty 120

## 2023-01-07 ENCOUNTER — Other Ambulatory Visit: Payer: Self-pay | Admitting: Family Medicine

## 2023-01-08 ENCOUNTER — Telehealth: Payer: Self-pay | Admitting: Family Medicine

## 2023-01-08 ENCOUNTER — Ambulatory Visit: Payer: Medicare Other | Admitting: Family Medicine

## 2023-01-08 VITALS — BP 124/62 | HR 55 | Wt 165.8 lb

## 2023-01-08 DIAGNOSIS — D692 Other nonthrombocytopenic purpura: Secondary | ICD-10-CM | POA: Diagnosis not present

## 2023-01-08 DIAGNOSIS — N1832 Chronic kidney disease, stage 3b: Secondary | ICD-10-CM | POA: Diagnosis not present

## 2023-01-08 DIAGNOSIS — E1122 Type 2 diabetes mellitus with diabetic chronic kidney disease: Secondary | ICD-10-CM

## 2023-01-08 DIAGNOSIS — F5101 Primary insomnia: Secondary | ICD-10-CM

## 2023-01-08 DIAGNOSIS — F324 Major depressive disorder, single episode, in partial remission: Secondary | ICD-10-CM

## 2023-01-08 MED ORDER — CLONAZEPAM 0.5 MG PO TABS: ORAL_TABLET | ORAL | 5 refills | Status: DC

## 2023-01-08 MED ORDER — DULOXETINE HCL 30 MG PO CPEP: 30.0000 mg | ORAL_CAPSULE | Freq: Every day | ORAL | 3 refills | Status: DC

## 2023-01-08 MED ORDER — ROSUVASTATIN CALCIUM 10 MG PO TABS: 10.0000 mg | ORAL_TABLET | Freq: Every day | ORAL | 1 refills | Status: DC

## 2023-01-08 MED ORDER — ALLOPURINOL 100 MG PO TABS: 100.0000 mg | ORAL_TABLET | Freq: Two times a day (BID) | ORAL | 1 refills | Status: DC

## 2023-01-08 NOTE — Progress Notes (Signed)
Subjective:    Patient ID: Sabrina Deleon, female    DOB: Nov 27, 1950, 72 y.o.   MRN: [id number]  Discussed the use of AI scribe software for clinical note transcription with the patient, who gave verbal consent to proceed.  History of Present Illness   The patient, with a history of diabetes and cancer, reports an overall improvement in mood and body discomfort since the last visit. They attribute this improvement to the Cymbalta medication, which they have been on for about six weeks. However, they express concern about their knee, which is causing them discomfort. Their energy levels have been fluctuating, with some days feeling more lethargic than others. Despite this, they have been actively involved in their daily activities, including caring for their grandchildren.  The patient also reports a decrease in urinary frequency, which they attribute to their cancer medication. They have been managing their diabetes well, with an A1c of 6.8 in September and occasional high readings, which they attribute to their diet. They have received their flu shot and are up to date with their appointments with their oncologist and other specialists.  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 They express a desire to continue playing as long as their body allows. Despite their health challenges, the patient maintains a positive outlook and is committed to managing their health as best as they can.         Review of Systems     Objective:    Physical Exam   CHEST: lungs clear to auscultation CARDIOVASCULAR: heart rhythm regular EXTREMITIES: ankles without abnormalities NEUROLOGICAL: intact sensation to monofilament test on feet SKIN: senile purpura noted     General-in no acute distress Eyes-no discharge Lungs-respiratory rate normal, CTA CV-no murmurs,RRR Extremities skin warm dry no edema Neuro grossly normal Behavior  normal, alert       Assessment & Plan:  Assessment and Plan    Depression Improved mood and energy levels with Cymbalta 30mg . No adverse effects reported. Discussed the potential for dose increase if needed. -Continue Cymbalta 30mg  daily. -Consider dose increase to 60mg  daily if symptoms do not continue to improve over the next 4-6 weeks. -Communicate with the office via MyChart or phone call to discuss symptom progression and potential dose adjustment.  Type 2 Diabetes Mellitus A1c of 6.8 in September, indicating good control. Patient reports occasional high glucose readings, likely related to diet. -Continue current management. -Plan for follow-up in March 2025 with repeat A1c and cholesterol testing.  Cancer Patient is currently receiving treatment, with labs and appointments scheduled through the cancer center. No specific type or stage of cancer mentioned in the conversation. -Continue current treatment as directed by oncology team. -Communicate any changes in condition to both primary care and oncology teams.  General Health Maintenance -Flu shot already administered for the current season. -Continue regular eye exams. -Continue regular foot exams due to diabetes. -Next appointment with Dr. Diona Browner in February 2025.     Senile purpura normal for age Depression under decent control patient will give [place] feedback in several weeks about possibly going up to 60 mg Follow-up in approximately 4 to 5 months

## 2023-01-08 NOTE — Telephone Encounter (Signed)
Refill on   furosemide (LASIX) 40 MG tablet  Send to [facility]

## 2023-01-10 ENCOUNTER — Other Ambulatory Visit: Payer: Self-pay

## 2023-01-10 MED ORDER — FUROSEMIDE 40 MG PO TABS: ORAL_TABLET | ORAL | 1 refills | Status: DC

## 2023-01-17 ENCOUNTER — Institutional Professional Consult (permissible substitution) (INDEPENDENT_AMBULATORY_CARE_PROVIDER_SITE_OTHER): Payer: Medicare Other

## 2023-01-24 ENCOUNTER — Other Ambulatory Visit: Payer: Self-pay | Admitting: Nurse Practitioner

## 2023-01-24 MED ORDER — CEFDINIR 300 MG PO CAPS
300.0000 mg | ORAL_CAPSULE | Freq: Two times a day (BID) | ORAL | 0 refills | Status: DC
Start: 2023-01-24 — End: 2023-04-30

## 2023-01-24 NOTE — Progress Notes (Signed)
Phone call from patient to nurse: C/o burning with urination, urgency and frequency No fever. No hematuria. No changes in her back pain. No N/V. Taking fluids well.  Has tried AZO and cranberry juice. No recent UTI. Could not get an appointment today. Will send in antibiotic. Call back in 48 hours if no better, sooner if worse.

## 2023-01-26 ENCOUNTER — Telehealth: Payer: Self-pay

## 2023-01-26 ENCOUNTER — Telehealth: Payer: Self-pay | Admitting: Family Medicine

## 2023-01-26 NOTE — Telephone Encounter (Signed)
Copied from CRM [id number]. Topic: General - Other >> Jan 24, 2023  1:21 PM Eunice Blase wrote: Reason for CRM: Pt call pt back [phone] returning your call about medication.

## 2023-01-26 NOTE — Telephone Encounter (Signed)
Called patient and followed up on (CRM) message.  Reason for CRM: Pt call pt back [phone] returning your call about medication.    Patient has her medication and it has been resolved.

## 2023-01-30 ENCOUNTER — Telehealth: Payer: Self-pay | Admitting: Family Medicine

## 2023-01-30 NOTE — Telephone Encounter (Signed)
Refill on Clonazepam 0.5 mg [facility] [address]

## 2023-01-30 NOTE — Telephone Encounter (Signed)
[facility]ed [facility] [address], they were able to locate the prescription from 01/08/23 with 5 refills in their system, called pt and let her know.

## 2023-01-31 ENCOUNTER — Inpatient Hospital Stay: Payer: Medicare Other | Attending: Physician Assistant | Admitting: Hematology and Oncology

## 2023-01-31 ENCOUNTER — Inpatient Hospital Stay: Payer: Medicare Other

## 2023-01-31 VITALS — BP 136/50 | HR 60 | Temp 97.9°F | Resp 13 | Wt 163.6 lb

## 2023-01-31 DIAGNOSIS — K6389 Other specified diseases of intestine: Secondary | ICD-10-CM | POA: Diagnosis not present

## 2023-01-31 DIAGNOSIS — C7A8 Other malignant neuroendocrine tumors: Secondary | ICD-10-CM | POA: Diagnosis not present

## 2023-01-31 DIAGNOSIS — K5900 Constipation, unspecified: Secondary | ICD-10-CM | POA: Diagnosis not present

## 2023-01-31 DIAGNOSIS — C7A098 Malignant carcinoid tumors of other sites: Secondary | ICD-10-CM | POA: Insufficient documentation

## 2023-01-31 DIAGNOSIS — C7B01 Secondary carcinoid tumors of distant lymph nodes: Secondary | ICD-10-CM | POA: Insufficient documentation

## 2023-01-31 LAB — CBC WITH DIFFERENTIAL (CANCER CENTER ONLY)
Abs Immature Granulocytes: 0.01 10*3/uL (ref 0.00–0.07)
Basophils Absolute: 0 10*3/uL (ref 0.0–0.1)
Basophils Relative: 1 %
Eosinophils Absolute: 0.3 10*3/uL (ref 0.0–0.5)
Eosinophils Relative: 4 %
HCT: 33.7 % — ABNORMAL LOW (ref 36.0–46.0)
Hemoglobin: 10.8 g/dL — ABNORMAL LOW (ref 12.0–15.0)
Immature Granulocytes: 0 %
Lymphocytes Relative: 18 %
Lymphs Abs: 1.2 10*3/uL (ref 0.7–4.0)
MCH: 30.6 pg (ref 26.0–34.0)
MCHC: 32 g/dL (ref 30.0–36.0)
MCV: 95.5 fL (ref 80.0–100.0)
Monocytes Absolute: 0.6 10*3/uL (ref 0.1–1.0)
Monocytes Relative: 9 %
Neutro Abs: 4.4 10*3/uL (ref 1.7–7.7)
Neutrophils Relative %: 68 %
Platelet Count: 146 10*3/uL — ABNORMAL LOW (ref 150–400)
RBC: 3.53 MIL/uL — ABNORMAL LOW (ref 3.87–5.11)
RDW: 13.2 % (ref 11.5–15.5)
WBC Count: 6.4 10*3/uL (ref 4.0–10.5)
nRBC: 0 % (ref 0.0–0.2)

## 2023-01-31 LAB — CMP (CANCER CENTER ONLY)
ALT: 30 U/L (ref 0–44)
AST: 56 U/L — ABNORMAL HIGH (ref 15–41)
Albumin: 4.1 g/dL (ref 3.5–5.0)
Alkaline Phosphatase: 76 U/L (ref 38–126)
Anion gap: 4 — ABNORMAL LOW (ref 5–15)
BUN: 28 mg/dL — ABNORMAL HIGH (ref 8–23)
CO2: 33 mmol/L — ABNORMAL HIGH (ref 22–32)
Calcium: 10.4 mg/dL — ABNORMAL HIGH (ref 8.9–10.3)
Chloride: 102 mmol/L (ref 98–111)
Creatinine: 1.18 mg/dL — ABNORMAL HIGH (ref 0.44–1.00)
GFR, Estimated: 49 mL/min — ABNORMAL LOW (ref >60)
Glucose, Bld: 187 mg/dL — ABNORMAL HIGH (ref 70–99)
Potassium: 4.4 mmol/L (ref 3.5–5.1)
Sodium: 139 mmol/L (ref 135–145)
Total Bilirubin: 0.6 mg/dL (ref <1.2)
Total Protein: 7.1 g/dL (ref 6.5–8.1)

## 2023-01-31 MED ORDER — LANREOTIDE ACETATE 120 MG/0.5ML ~~LOC~~ SOLN
120.0000 mg | Freq: Once | SUBCUTANEOUS | Status: AC
  Administered 2023-01-31: 120 mg via SUBCUTANEOUS
  Filled 2023-01-31: qty 120

## 2023-01-31 NOTE — Progress Notes (Signed)
[facility] [phone]   [phone]  PROGRESS NOTE  Patient Care Team: Babs Sciara, MD as PCP - General (Family Medicine) Jonelle Sidle, MD as PCP - Cardiology (Cardiology)  Hematological/Oncological History # Well Differentiated Neuroendocrine Tumor, GI origin. Grade 2.  11/29/2021: Patient underwent CT angiogram C/A/P prior to consideration of TAVR for severe aortic stenosis, noted to have bulky central mesenteric soft tissue mass that has increased in 2019.  Additionally there was new widespread mild to moderate retroperitoneal, retrocrural, mediastinal and bilateral hilar lymphadenopathy 12/15/2021: NM PET CT Scan showed hypermetabolic mesenteric mass with hypermetabolic adenopathy in the small bowel mesentery, abdominal retroperitoneum and retrocrural stations. 12/28/2021: IR guided biopsy of the mesenteric mass was consistent with well differentiated neuroendocrine tumor 01/10/2022: start of lanreotide 120mg  IM q 28 days.   Interval History:  Sabrina Deleon 72 y.o. female with medical history significant for newly diagnosed neuroendocrine tumor who presents for a follow up visit. The patient's last visit was on 11/08/2022. In the interim since the last visit she continued lanreotide treatment.  On exam today Sabrina Deleon is accompanied by her husband.  She reports that she feels well overall and is not having any issues with the lanreotide injections.  She reports that she tends to have more constipation than anything.  She reports that she drinks Metamucil which does help some with the abdominal discomfort and occasional constipation.  She reports that she does occasionally have a little bit of abdominal pain for which she takes Zofran.  She notes that she is eating pretty good and her weight has been stable.  She has not had any infectious symptoms such as runny nose, sore throat, or cough.  Overall she is willing and able to continue with  lanreotide shots at this time.  Overall she is tolerating lanreotide shots well and is willing and able to proceed with them at this time.  She denies any fevers, chills, sweats, nausea, vomiting or diarrhea. Full 10 point ROS was otherwise negative.   MEDICAL HISTORY:  Past Medical History:  Diagnosis Date   Anxiety    Aortic stenosis    Arthritis    Chronic kidney disease    Dr. Salem Caster- noted increase kidney function levels- took pt off Metformin and started on Onglyza for Blood sugar control-pt being followed for this.   GERD (gastroesophageal reflux disease)    Gout    Hypertension    Hypothyroidism    Morbid obesity (HCC) 03/29/2018   Patient has elevated BMI with diabetes hyperlipidemia and hypertension   Pancreatitis    Type 2 diabetes mellitus (HCC)    Wears glasses    Wears partial dentures    Upper    SURGICAL HISTORY: Past Surgical History:  Procedure Laterality Date   BACK SURGERY     Lumbar   CARPAL TUNNEL RELEASE  11/02/2011   Procedure: CARPAL TUNNEL RELEASE;  Surgeon: Tami Ribas, MD;  Location: [facility];  Service: Orthopedics;  Laterality: Right;   CARPAL TUNNEL RELEASE Left 01/14/2015   Procedure: LEFT CARPAL TUNNEL RELEASE;  Surgeon: Betha Loa, MD;  Location: [facility];  Service: Orthopedics;  Laterality: Left;   CHOLECYSTECTOMY     laparoscopic   COLONOSCOPY     COLONOSCOPY N/A 11/12/2015   Procedure: COLONOSCOPY;  Surgeon: Malissa Hippo, MD;  Location: [facility];  Service: Endoscopy;  Laterality: N/A;  9:30   ESOPHAGOGASTRODUODENOSCOPY N/A 11/12/2015   Procedure: ESOPHAGOGASTRODUODENOSCOPY (EGD);  Surgeon: Malissa Hippo, MD;  Location: [facility];  Service: Endoscopy;  Laterality: N/A;   REVERSE SHOULDER ARTHROPLASTY Right 11/04/2018   REVERSE SHOULDER ARTHROPLASTY Right 11/04/2018   Procedure: REVERSE SHOULDER ARTHROPLASTY;  Surgeon: Ernest Mallick, MD;  Location: [facility];  Service: Orthopedics;   Laterality: Right;   RIGHT/LEFT HEART CATH AND CORONARY ANGIOGRAPHY N/A 10/18/2017   Procedure: RIGHT/LEFT HEART CATH AND CORONARY ANGIOGRAPHY;  Surgeon: Kathleene Hazel, MD;  Location: [facility];  Service: Cardiovascular;  Laterality: N/A;   TOTAL HIP ARTHROPLASTY Left 03/22/2016   Procedure: LEFT TOTAL HIP ARTHROPLASTY ANTERIOR APPROACH;  Surgeon: Ollen Gross, MD;  Location: [facility];  Service: Orthopedics;  Laterality: Left;    SOCIAL HISTORY: Social History   Socioeconomic History   Marital status: Married    Spouse name: Not on file   Number of children: Not on file   Years of education: Not on file   Highest education level: Some college, no degree  Occupational History   Not on file  Tobacco Use   Smoking status: Former    Current packs/day: 0.00    Types: Cigarettes    Quit date: 10/29/1981    Years since quitting: 41.2   Smokeless tobacco: Never  Vaping Use   Vaping status: Never Used  Substance and Sexual Activity   Alcohol use: Yes    Comment: occasionally   Drug use: No   Sexual activity: Not Currently  Other Topics Concern   Not on file  Social History Narrative   Not on file   Social Determinants of Health   Financial Resource Strain: Low Risk  (01/08/2023)   Overall Financial Resource Strain (CARDIA)    Difficulty of Paying Living Expenses: Not hard at all  Food Insecurity: No Food Insecurity (01/08/2023)   Hunger Vital Sign    Worried About Running Out of Food in the Last Year: Never true    Ran Out of Food in the Last Year: Never true  Transportation Needs: No Transportation Needs (01/08/2023)   PRAPARE - Administrator, Civil Service (Medical): No    Lack of Transportation (Non-Medical): No  Physical Activity: Inactive (01/08/2023)   Exercise Vital Sign    Days of Exercise per Week: 0 days    Minutes of Exercise per Session: 20 min  Stress: No Stress Concern Present (01/08/2023)   [employer] of Occupational Health  - Occupational Stress Questionnaire    Feeling of Stress : Only a little  Social Connections: Socially Integrated (01/08/2023)   Social Connection and Isolation Panel [NHANES]    Frequency of Communication with Friends and Family: More than three times a week    Frequency of Social Gatherings with Friends and Family: More than three times a week    Attends Religious Services: More than 4 times per year    Active Member of [employer] or Organizations: Yes    Attends Engineer, structural: More than 4 times per year    Marital Status: Married  Catering manager Violence: Not on file    FAMILY HISTORY: Family History  Problem Relation Age of Onset   Breast cancer Mother    Lung cancer Mother        likely recurrent breast cancer   Breast cancer Paternal Grandmother    Head & neck cancer Nephew     ALLERGIES:  is allergic to bee venom.  MEDICATIONS:  Current Outpatient Medications  Medication Sig Dispense Refill   allopurinol (  ZYLOPRIM) 100 MG tablet Take 1 tablet (100 mg total) by mouth 2 (two) times daily. 180 tablet 1   cefdinir (OMNICEF) 300 MG capsule Take 1 capsule (300 mg total) by mouth 2 (two) times daily. 10 capsule 0   Cholecalciferol (VITAMIN D) 50 MCG (2000 UT) tablet Take 2,000 Units by mouth daily.     clonazePAM (KLONOPIN) 0.5 MG tablet 1 nightly as needed insomnia 30 tablet 5   Colchicine 0.6 MG CAPS Take one cap po BID prn gout flare up 30 capsule 0   dicyclomine (BENTYL) 10 MG capsule Take 1 capsule (10 mg total) by mouth 3 (three) times daily before meals. 90 capsule 0   DULoxetine (CYMBALTA) 30 MG capsule Take 1 capsule (30 mg total) by mouth daily. 30 capsule 3   ferrous sulfate 325 (65 FE) MG tablet Take 325 mg by mouth daily with breakfast.     furosemide (LASIX) 40 MG tablet TAKE 1 TABLET(40 MG) BY MOUTH DAILY 90 tablet 1   ipratropium (ATROVENT) 0.03 % nasal spray Place 2 sprays into both nostrils every 12 (twelve) hours. 30 mL 12   JANUVIA 50 MG tablet  TAKE 1 TABLET(50 MG) BY MOUTH DAILY 30 tablet 4   ketoconazole (NIZORAL) 2 % cream Apply 1 Application topically daily as needed (fungus). 60 g 0   levothyroxine (SYNTHROID) 125 MCG tablet TAKE 1 TABLET(125 MCG) BY MOUTH DAILY BEFORE BREAKFAST 90 tablet 1   lisinopril (ZESTRIL) 2.5 MG tablet Take 1 tablet (2.5 mg total) by mouth daily. 90 tablet 1   MISC NATURAL PRODUCTS PO Take 1 tablet by mouth daily. Super Beets otc supplement     ondansetron (ZOFRAN) 8 MG tablet Take 1 tablet (8 mg total) by mouth every 8 (eight) hours as needed. 30 tablet 0   ONETOUCH ULTRA test strip TEST ONCE DAILY 50 strip 5   pantoprazole (PROTONIX) 40 MG tablet TAKE 1 TABLET(40 MG) BY MOUTH DAILY 90 tablet 1   potassium chloride (KLOR-CON M) 10 MEQ tablet TAKE 1 TABLET BY MOUTH EVERY MONDAY, WEDNESDAY, AND FRIDAY 14 tablet 6   rosuvastatin (CRESTOR) 10 MG tablet Take 1 tablet (10 mg total) by mouth daily. 90 tablet 1   No current facility-administered medications for this visit.    REVIEW OF SYSTEMS:   Constitutional: ( - ) fevers, ( - )  chills , ( - ) night sweats Eyes: ( - ) blurriness of vision, ( - ) double vision, ( - ) watery eyes Ears, nose, mouth, throat, and face: ( - ) mucositis, ( - ) sore throat Respiratory: ( - ) cough, ( - ) dyspnea, ( - ) wheezes Cardiovascular: ( - ) palpitation, ( - ) chest discomfort, ( - ) lower extremity swelling Gastrointestinal:  ( - ) nausea, ( - ) heartburn, ( - ) change in bowel habits Skin: ( - ) abnormal skin rashes Lymphatics: ( - ) new lymphadenopathy, ( - ) easy bruising Neurological: ( - ) numbness, ( - ) tingling, ( - ) new weaknesses Behavioral/Psych: ( - ) mood change, ( - ) new changes  All other systems were reviewed with the patient and are negative.  PHYSICAL EXAMINATION: ECOG PERFORMANCE STATUS: 1 - Symptomatic but completely ambulatory  Vitals:   01/31/23 1153  BP: (!) 136/50  Pulse: 60  Resp: 13  Temp: 97.9 F (36.6 C)  SpO2: 100%     Filed  Weights   01/31/23 1153  Weight: 163 lb 9.6 oz (74.2 kg)  GENERAL: Well-appearing elderly Caucasian female, alert, no distress and comfortable SKIN: skin color, texture, turgor are normal, no rashes or significant lesions EYES: conjunctiva are pink and non-injected, sclera clear LUNGS: clear to auscultation and percussion with normal breathing effort HEART: regular rate & rhythm and no murmurs and no lower extremity edema Musculoskeletal: no cyanosis of digits and no clubbing  PSYCH: alert & oriented x 3, fluent speech NEURO: no focal motor/sensory deficits  LABORATORY DATA:  I have reviewed the data as listed    Latest Ref Rng & Units 01/31/2023   11:15 AM 01/04/2023   11:08 AM 12/06/2022   11:11 AM  CBC  WBC 4.0 - 10.5 K/uL 6.4  5.7  6.1   Hemoglobin 12.0 - 15.0 g/dL 40.9  81.1  91.4   Hematocrit 36.0 - 46.0 % 33.7  32.7  31.4   Platelets 150 - 400 K/uL 146  154  148        Latest Ref Rng & Units 01/31/2023   11:15 AM 01/04/2023   11:08 AM 12/06/2022   11:11 AM  CMP  Glucose 70 - 99 mg/dL 782  956  213   BUN 8 - 23 mg/dL 28  31  21    Creatinine 0.44 - 1.00 mg/dL 0.86  5.78  4.69   Sodium 135 - 145 mmol/L 139  137  139   Potassium 3.5 - 5.1 mmol/L 4.4  4.9  4.5   Chloride 98 - 111 mmol/L 102  101  102   CO2 22 - 32 mmol/L 33  32  33   Calcium 8.9 - 10.3 mg/dL 62.9  52.8  41.3   Total Protein 6.5 - 8.1 g/dL 7.1  7.3  7.2   Total Bilirubin <1.2 mg/dL 0.6  0.7  0.7   Alkaline Phos 38 - 126 U/L 76  79  68   AST 15 - 41 U/L 56  63  42   ALT 0 - 44 U/L 30  35  17    RADIOGRAPHIC STUDIES: No results found.  ASSESSMENT & PLAN Sabrina Deleon 72 y.o. female with medical history significant for newly diagnosed neuroendocrine tumor who presents for a follow up visit.  #Metastatic Well Differentiated Neuroendocrine Tumor of the GI Tract -- CT imaging and PET CT scan confirmed widespread lymphadenopathy consistent with metastatic spread of well-differentiated  neuroendocrine tumor. -- Biopsy confirms this is a well differentiated neuroendocrine tumor of the GI tract. -- Today we will proceed with lanreotide 120 mg IM q. 28 days. --labs today show white blood cell 6.4, hemoglobin 10.8, MCV 95.5, platelets 146 --Due for repeat CT imaging in March 2025. Last scan on 11/30/2022.  If stable continue every 6 month imaging -- Have patient return to clinic in 12 weeks time with interval monthly shots.   #Hyperglycemia: -- Appears to be under better control at this time.  No orders of the defined types were placed in this encounter.   All questions were answered. The patient knows to call the clinic with any problems, questions or concerns.  I have spent a total of 30 minutes minutes of face-to-face and non-face-to-face time, preparing to see the patient, performing a medically appropriate examination, counseling and educating the patient, ordering tests/procedures, referring and communicating with other health care professionals, documenting clinical information in the electronic health record, and care coordination.   Ulysees Barns, MD Department of Hematology/Oncology [facility] Phone: [phone] Pager: [phone] Email: [email or web address]  01/31/2023  4:07 PM

## 2023-02-28 ENCOUNTER — Inpatient Hospital Stay: Payer: Medicare Other

## 2023-02-28 ENCOUNTER — Inpatient Hospital Stay: Payer: Medicare Other | Attending: Physician Assistant

## 2023-02-28 VITALS — BP 125/55 | HR 60 | Temp 97.8°F | Resp 16

## 2023-02-28 DIAGNOSIS — C7A8 Other malignant neuroendocrine tumors: Secondary | ICD-10-CM

## 2023-02-28 DIAGNOSIS — C7A098 Malignant carcinoid tumors of other sites: Secondary | ICD-10-CM | POA: Insufficient documentation

## 2023-02-28 DIAGNOSIS — C7B01 Secondary carcinoid tumors of distant lymph nodes: Secondary | ICD-10-CM | POA: Insufficient documentation

## 2023-02-28 LAB — CMP (CANCER CENTER ONLY)
ALT: 32 U/L (ref 0–44)
AST: 60 U/L — ABNORMAL HIGH (ref 15–41)
Albumin: 4.3 g/dL (ref 3.5–5.0)
Alkaline Phosphatase: 69 U/L (ref 38–126)
Anion gap: 6 (ref 5–15)
BUN: 24 mg/dL — ABNORMAL HIGH (ref 8–23)
CO2: 33 mmol/L — ABNORMAL HIGH (ref 22–32)
Calcium: 11.1 mg/dL — ABNORMAL HIGH (ref 8.9–10.3)
Chloride: 101 mmol/L (ref 98–111)
Creatinine: 1.24 mg/dL — ABNORMAL HIGH (ref 0.44–1.00)
GFR, Estimated: 47 mL/min — ABNORMAL LOW (ref >60)
Glucose, Bld: 144 mg/dL — ABNORMAL HIGH (ref 70–99)
Potassium: 4.6 mmol/L (ref 3.5–5.1)
Sodium: 140 mmol/L (ref 135–145)
Total Bilirubin: 0.7 mg/dL (ref <1.2)
Total Protein: 7.8 g/dL (ref 6.5–8.1)

## 2023-02-28 LAB — CBC WITH DIFFERENTIAL (CANCER CENTER ONLY)
Abs Immature Granulocytes: 0 10*3/uL (ref 0.00–0.07)
Basophils Absolute: 0 10*3/uL (ref 0.0–0.1)
Basophils Relative: 1 %
Eosinophils Absolute: 0.4 10*3/uL (ref 0.0–0.5)
Eosinophils Relative: 6 %
HCT: 34.9 % — ABNORMAL LOW (ref 36.0–46.0)
Hemoglobin: 11.2 g/dL — ABNORMAL LOW (ref 12.0–15.0)
Immature Granulocytes: 0 %
Lymphocytes Relative: 22 %
Lymphs Abs: 1.4 10*3/uL (ref 0.7–4.0)
MCH: 29.9 pg (ref 26.0–34.0)
MCHC: 32.1 g/dL (ref 30.0–36.0)
MCV: 93.3 fL (ref 80.0–100.0)
Monocytes Absolute: 0.5 10*3/uL (ref 0.1–1.0)
Monocytes Relative: 8 %
Neutro Abs: 4 10*3/uL (ref 1.7–7.7)
Neutrophils Relative %: 63 %
Platelet Count: 152 10*3/uL (ref 150–400)
RBC: 3.74 MIL/uL — ABNORMAL LOW (ref 3.87–5.11)
RDW: 13.2 % (ref 11.5–15.5)
WBC Count: 6.3 10*3/uL (ref 4.0–10.5)
nRBC: 0 % (ref 0.0–0.2)

## 2023-02-28 MED ORDER — LANREOTIDE ACETATE 120 MG/0.5ML ~~LOC~~ SOLN
120.0000 mg | Freq: Once | SUBCUTANEOUS | Status: AC
Start: 2023-02-28 — End: 2023-02-28
  Administered 2023-02-28: 120 mg via SUBCUTANEOUS
  Filled 2023-02-28: qty 120

## 2023-03-01 LAB — CHROMOGRANIN A: Chromogranin A (ng/mL): 143.9 ng/mL — ABNORMAL HIGH (ref 0.0–101.8)

## 2023-03-07 ENCOUNTER — Ambulatory Visit: Payer: Medicare Other | Admitting: Family Medicine

## 2023-03-20 ENCOUNTER — Other Ambulatory Visit: Payer: Self-pay | Admitting: Hematology and Oncology

## 2023-03-28 ENCOUNTER — Inpatient Hospital Stay: Payer: Medicare Other

## 2023-03-28 ENCOUNTER — Inpatient Hospital Stay: Payer: Medicare Other | Attending: Physician Assistant

## 2023-03-28 VITALS — BP 131/49 | HR 69 | Temp 98.2°F | Resp 16

## 2023-03-28 DIAGNOSIS — C7B01 Secondary carcinoid tumors of distant lymph nodes: Secondary | ICD-10-CM | POA: Diagnosis not present

## 2023-03-28 DIAGNOSIS — C7A8 Other malignant neuroendocrine tumors: Secondary | ICD-10-CM

## 2023-03-28 DIAGNOSIS — C7A098 Malignant carcinoid tumors of other sites: Secondary | ICD-10-CM | POA: Insufficient documentation

## 2023-03-28 LAB — CBC WITH DIFFERENTIAL (CANCER CENTER ONLY)
Abs Immature Granulocytes: 0.01 10*3/uL (ref 0.00–0.07)
Basophils Absolute: 0 10*3/uL (ref 0.0–0.1)
Basophils Relative: 1 %
Eosinophils Absolute: 0.4 10*3/uL (ref 0.0–0.5)
Eosinophils Relative: 5 %
HCT: 33 % — ABNORMAL LOW (ref 36.0–46.0)
Hemoglobin: 10.6 g/dL — ABNORMAL LOW (ref 12.0–15.0)
Immature Granulocytes: 0 %
Lymphocytes Relative: 18 %
Lymphs Abs: 1.2 10*3/uL (ref 0.7–4.0)
MCH: 30 pg (ref 26.0–34.0)
MCHC: 32.1 g/dL (ref 30.0–36.0)
MCV: 93.5 fL (ref 80.0–100.0)
Monocytes Absolute: 0.5 10*3/uL (ref 0.1–1.0)
Monocytes Relative: 7 %
Neutro Abs: 4.5 10*3/uL (ref 1.7–7.7)
Neutrophils Relative %: 69 %
Platelet Count: 161 10*3/uL (ref 150–400)
RBC: 3.53 MIL/uL — ABNORMAL LOW (ref 3.87–5.11)
RDW: 13.6 % (ref 11.5–15.5)
WBC Count: 6.6 10*3/uL (ref 4.0–10.5)
nRBC: 0 % (ref 0.0–0.2)

## 2023-03-28 LAB — CMP (CANCER CENTER ONLY)
ALT: 28 U/L (ref 0–44)
AST: 56 U/L — ABNORMAL HIGH (ref 15–41)
Albumin: 4.2 g/dL (ref 3.5–5.0)
Alkaline Phosphatase: 72 U/L (ref 38–126)
Anion gap: 6 (ref 5–15)
BUN: 31 mg/dL — ABNORMAL HIGH (ref 8–23)
CO2: 27 mmol/L (ref 22–32)
Calcium: 10.3 mg/dL (ref 8.9–10.3)
Chloride: 105 mmol/L (ref 98–111)
Creatinine: 1.34 mg/dL — ABNORMAL HIGH (ref 0.44–1.00)
GFR, Estimated: 42 mL/min — ABNORMAL LOW (ref >60)
Glucose, Bld: 193 mg/dL — ABNORMAL HIGH (ref 70–99)
Potassium: 4 mmol/L (ref 3.5–5.1)
Sodium: 138 mmol/L (ref 135–145)
Total Bilirubin: 0.6 mg/dL (ref 0.0–1.2)
Total Protein: 7.2 g/dL (ref 6.5–8.1)

## 2023-03-28 MED ORDER — LANREOTIDE ACETATE 120 MG/0.5ML ~~LOC~~ SOLN
120.0000 mg | Freq: Once | SUBCUTANEOUS | Status: AC
  Administered 2023-03-28: 120 mg via SUBCUTANEOUS
  Filled 2023-03-28: qty 120

## 2023-04-05 ENCOUNTER — Telehealth: Payer: Self-pay | Admitting: Family Medicine

## 2023-04-05 MED ORDER — ONETOUCH ULTRA VI STRP: 1.0000 | ORAL_STRIP | 5 refills | Status: AC | PRN

## 2023-04-05 NOTE — Telephone Encounter (Signed)
Refill on  ONETOUCH ULTRA blue test strip send to [facility]

## 2023-04-24 ENCOUNTER — Other Ambulatory Visit: Payer: Self-pay | Admitting: Family Medicine

## 2023-04-24 DIAGNOSIS — N1832 Chronic kidney disease, stage 3b: Secondary | ICD-10-CM

## 2023-04-25 ENCOUNTER — Inpatient Hospital Stay: Payer: Medicare Other | Attending: Physician Assistant | Admitting: Hematology and Oncology

## 2023-04-25 ENCOUNTER — Inpatient Hospital Stay: Payer: Medicare Other

## 2023-04-25 ENCOUNTER — Encounter: Payer: Self-pay | Admitting: Hematology and Oncology

## 2023-04-25 VITALS — BP 119/76 | HR 60 | Temp 97.8°F | Resp 13 | Wt 162.1 lb

## 2023-04-25 DIAGNOSIS — C7B01 Secondary carcinoid tumors of distant lymph nodes: Secondary | ICD-10-CM | POA: Diagnosis not present

## 2023-04-25 DIAGNOSIS — C7A8 Other malignant neuroendocrine tumors: Secondary | ICD-10-CM

## 2023-04-25 DIAGNOSIS — C7A098 Malignant carcinoid tumors of other sites: Secondary | ICD-10-CM | POA: Diagnosis not present

## 2023-04-25 DIAGNOSIS — E1165 Type 2 diabetes mellitus with hyperglycemia: Secondary | ICD-10-CM | POA: Diagnosis not present

## 2023-04-25 DIAGNOSIS — K6389 Other specified diseases of intestine: Secondary | ICD-10-CM

## 2023-04-25 DIAGNOSIS — R197 Diarrhea, unspecified: Secondary | ICD-10-CM | POA: Insufficient documentation

## 2023-04-25 LAB — CMP (CANCER CENTER ONLY)
ALT: 53 U/L — ABNORMAL HIGH (ref 0–44)
AST: 91 U/L — ABNORMAL HIGH (ref 15–41)
Albumin: 4.2 g/dL (ref 3.5–5.0)
Alkaline Phosphatase: 67 U/L (ref 38–126)
Anion gap: 4 — ABNORMAL LOW (ref 5–15)
BUN: 21 mg/dL (ref 8–23)
CO2: 33 mmol/L — ABNORMAL HIGH (ref 22–32)
Calcium: 10.5 mg/dL — ABNORMAL HIGH (ref 8.9–10.3)
Chloride: 103 mmol/L (ref 98–111)
Creatinine: 1.18 mg/dL — ABNORMAL HIGH (ref 0.44–1.00)
GFR, Estimated: 49 mL/min — ABNORMAL LOW (ref >60)
Glucose, Bld: 153 mg/dL — ABNORMAL HIGH (ref 70–99)
Potassium: 4.2 mmol/L (ref 3.5–5.1)
Sodium: 140 mmol/L (ref 135–145)
Total Bilirubin: 0.7 mg/dL (ref 0.0–1.2)
Total Protein: 7.1 g/dL (ref 6.5–8.1)

## 2023-04-25 LAB — CBC WITH DIFFERENTIAL (CANCER CENTER ONLY)
Abs Immature Granulocytes: 0.01 10*3/uL (ref 0.00–0.07)
Basophils Absolute: 0 10*3/uL (ref 0.0–0.1)
Basophils Relative: 1 %
Eosinophils Absolute: 0.5 10*3/uL (ref 0.0–0.5)
Eosinophils Relative: 7 %
HCT: 32.2 % — ABNORMAL LOW (ref 36.0–46.0)
Hemoglobin: 10.6 g/dL — ABNORMAL LOW (ref 12.0–15.0)
Immature Granulocytes: 0 %
Lymphocytes Relative: 18 %
Lymphs Abs: 1.1 10*3/uL (ref 0.7–4.0)
MCH: 30.2 pg (ref 26.0–34.0)
MCHC: 32.9 g/dL (ref 30.0–36.0)
MCV: 91.7 fL (ref 80.0–100.0)
Monocytes Absolute: 0.4 10*3/uL (ref 0.1–1.0)
Monocytes Relative: 7 %
Neutro Abs: 4.1 10*3/uL (ref 1.7–7.7)
Neutrophils Relative %: 67 %
Platelet Count: 146 10*3/uL — ABNORMAL LOW (ref 150–400)
RBC: 3.51 MIL/uL — ABNORMAL LOW (ref 3.87–5.11)
RDW: 13.7 % (ref 11.5–15.5)
WBC Count: 6.2 10*3/uL (ref 4.0–10.5)
nRBC: 0 % (ref 0.0–0.2)

## 2023-04-25 MED ORDER — LANREOTIDE ACETATE 120 MG/0.5ML ~~LOC~~ SOLN
120.0000 mg | Freq: Once | SUBCUTANEOUS | Status: AC
  Administered 2023-04-25: 120 mg via SUBCUTANEOUS
  Filled 2023-04-25: qty 120

## 2023-04-25 NOTE — Progress Notes (Signed)
 [facility] [phone]   [phone]  PROGRESS NOTE  Patient Care Team: Alphonsa Glendia LABOR, MD as PCP - General (Family Medicine) Debera Jayson MATSU, MD as PCP - Cardiology (Cardiology)  Hematological/Oncological History # Well Differentiated Neuroendocrine Tumor, GI origin. Grade 2.  11/29/2021: Patient underwent CT angiogram C/A/P prior to consideration of TAVR for severe aortic stenosis, noted to have bulky central mesenteric soft tissue mass that has increased in 2019.  Additionally there was new widespread mild to moderate retroperitoneal, retrocrural, mediastinal and bilateral hilar lymphadenopathy 12/15/2021: NM PET CT Scan showed hypermetabolic mesenteric mass with hypermetabolic adenopathy in the small bowel mesentery, abdominal retroperitoneum and retrocrural stations. 12/28/2021: IR guided biopsy of the mesenteric mass was consistent with well differentiated neuroendocrine tumor 01/10/2022: start of lanreotide 120mg  IM q 28 days.   Interval History:  Sabrina Deleon 73 y.o. female with medical history significant for newly diagnosed neuroendocrine tumor who presents for a follow up visit. The patient's last visit was on 01/31/2023. In the interim since the last visit she continued lanreotide treatment.  On exam today Sabrina Deleon is accompanied by her husband.  She reports she has been all right in the interim since her last visit.  She reports is having more diarrhea since she started the shot.  She reports that she tends to have a bowel movement every time she sits to urinate.  She had about 3 bowel movements per day.  She reports it is not associated with any vomiting but she is having some occasional nausea.  She reports that the nausea is well-controlled with her Zofran  therapy.  She does have some occasional left-sided abdominal discomfort.  She is not currently taking in any alcohol and reports that she is having some occasional Tylenol  use for aches  and pains.  She reports the color of her stool has been greenish and has a foul smell.  She does not also endorse fatigue.  Overall she is tolerating lanreotide shots well and is willing and able to proceed with them at this time.  She denies any fevers, chills, sweats, nausea, vomiting or diarrhea. Full 10 point ROS was otherwise negative.   MEDICAL HISTORY:  Past Medical History:  Diagnosis Date   Anxiety    Aortic stenosis    Arthritis    Chronic Deleon disease    Dr. Alphonsa LEOS- noted increase Deleon function levels- took pt off Metformin  and started on Onglyza for Blood sugar control-pt being followed for this.   GERD (gastroesophageal reflux disease)    Gout    Hypertension    Hypothyroidism    Morbid obesity (HCC) 03/29/2018   Patient has elevated BMI with diabetes hyperlipidemia and hypertension   Pancreatitis    Type 2 diabetes mellitus (HCC)    Wears glasses    Wears partial dentures    Upper    SURGICAL HISTORY: Past Surgical History:  Procedure Laterality Date   BACK SURGERY     Lumbar   CARPAL TUNNEL RELEASE  11/02/2011   Procedure: CARPAL TUNNEL RELEASE;  Surgeon: Franky JONELLE Curia, MD;  Location: [facility];  Service: Orthopedics;  Laterality: Right;   CARPAL TUNNEL RELEASE Left 01/14/2015   Procedure: LEFT CARPAL TUNNEL RELEASE;  Surgeon: Franky Curia, MD;  Location:  SURGERY CENTER;  Service: Orthopedics;  Laterality: Left;   CHOLECYSTECTOMY     laparoscopic   COLONOSCOPY     COLONOSCOPY N/A 11/12/2015   Procedure: COLONOSCOPY;  Surgeon: Claudis RAYMOND Rivet, MD;  Location: [facility];  Service: Endoscopy;  Laterality: N/A;  9:30   ESOPHAGOGASTRODUODENOSCOPY N/A 11/12/2015   Procedure: ESOPHAGOGASTRODUODENOSCOPY (EGD);  Surgeon: Claudis RAYMOND Rivet, MD;  Location: [facility];  Service: Endoscopy;  Laterality: N/A;   REVERSE SHOULDER ARTHROPLASTY Right 11/04/2018   REVERSE SHOULDER ARTHROPLASTY Right 11/04/2018   Procedure: REVERSE SHOULDER  ARTHROPLASTY;  Surgeon: Carolee Lynwood JINNY DOUGLAS, MD;  Location: [facility];  Service: Orthopedics;  Laterality: Right;   RIGHT/LEFT HEART CATH AND CORONARY ANGIOGRAPHY N/A 10/18/2017   Procedure: RIGHT/LEFT HEART CATH AND CORONARY ANGIOGRAPHY;  Surgeon: Verlin Lonni BIRCH, MD;  Location: [facility];  Service: Cardiovascular;  Laterality: N/A;   TOTAL HIP ARTHROPLASTY Left 03/22/2016   Procedure: LEFT TOTAL HIP ARTHROPLASTY ANTERIOR APPROACH;  Surgeon: Dempsey Moan, MD;  Location: [facility];  Service: Orthopedics;  Laterality: Left;    SOCIAL HISTORY: Social History   Socioeconomic History   Marital status: Married    Spouse name: Not on file   Number of children: Not on file   Years of education: Not on file   Highest education level: Some college, no degree  Occupational History   Not on file  Tobacco Use   Smoking status: Former    Current packs/day: 0.00    Types: Cigarettes    Quit date: 10/29/1981    Years since quitting: 41.5   Smokeless tobacco: Never  Vaping Use   Vaping status: Never Used  Substance and Sexual Activity   Alcohol use: Yes    Comment: occasionally   Drug use: No   Sexual activity: Not Currently  Other Topics Concern   Not on file  Social History Narrative   Not on file   Social Drivers of Health   Financial Resource Strain: Low Risk  (01/08/2023)   Overall Financial Resource Strain (CARDIA)    Difficulty of Paying Living Expenses: Not hard at all  Food Insecurity: No Food Insecurity (01/08/2023)   Hunger Vital Sign    Worried About Running Out of Food in the Last Year: Never true    Ran Out of Food in the Last Year: Never true  Transportation Needs: No Transportation Needs (01/08/2023)   PRAPARE - Administrator, Civil Service (Medical): No    Lack of Transportation (Non-Medical): No  Physical Activity: Inactive (01/08/2023)   Exercise Vital Sign    Days of Exercise per Week: 0 days    Minutes of Exercise per Session: 20 min   Stress: No Stress Concern Present (01/08/2023)   [employer] of Occupational Health - Occupational Stress Questionnaire    Feeling of Stress : Only a little  Social Connections: Socially Integrated (01/08/2023)   Social Connection and Isolation Panel [NHANES]    Frequency of Communication with Friends and Family: More than three times a week    Frequency of Social Gatherings with Friends and Family: More than three times a week    Attends Religious Services: More than 4 times per year    Active Member of [employer] or Organizations: Yes    Attends Engineer, Structural: More than 4 times per year    Marital Status: Married  Catering Manager Violence: Not on file    FAMILY HISTORY: Family History  Problem Relation Age of Onset   Breast cancer Mother    Lung cancer Mother        likely recurrent breast cancer   Breast cancer Paternal Grandmother    Head & neck cancer Nephew  ALLERGIES:  is allergic to bee venom.  MEDICATIONS:  Current Outpatient Medications  Medication Sig Dispense Refill   allopurinol  (ZYLOPRIM ) 100 MG tablet Take 1 tablet (100 mg total) by mouth 2 (two) times daily. 180 tablet 1   cefdinir  (OMNICEF ) 300 MG capsule Take 1 capsule (300 mg total) by mouth 2 (two) times daily. 10 capsule 0   Cholecalciferol (VITAMIN D) 50 MCG (2000 UT) tablet Take 2,000 Units by mouth daily.     clonazePAM  (KLONOPIN ) 0.5 MG tablet 1 nightly as needed insomnia 30 tablet 5   Colchicine  0.6 MG CAPS Take one cap po BID prn gout flare up 30 capsule 0   dicyclomine  (BENTYL ) 10 MG capsule Take 1 capsule (10 mg total) by mouth 3 (three) times daily before meals. 90 capsule 0   DULoxetine  (CYMBALTA ) 30 MG capsule Take 1 capsule (30 mg total) by mouth daily. 30 capsule 3   ferrous sulfate  325 (65 FE) MG tablet Take 325 mg by mouth daily with breakfast.     furosemide  (LASIX ) 40 MG tablet TAKE 1 TABLET(40 MG) BY MOUTH DAILY 90 tablet 1   glucose blood (ONETOUCH ULTRA) test strip  1 each by Other route as needed for other. for testing 50 strip 5   glucose blood test strip TEASPOONSFUL ONCE DAILY AS DIRECTED     ipratropium (ATROVENT ) 0.03 % nasal spray Place 2 sprays into both nostrils every 12 (twelve) hours. 30 mL 12   ketoconazole  (NIZORAL ) 2 % cream Apply 1 Application topically daily as needed (fungus). 60 g 0   levothyroxine  (SYNTHROID ) 125 MCG tablet TAKE 1 TABLET(125 MCG) BY MOUTH DAILY BEFORE BREAKFAST 90 tablet 1   lisinopril  (ZESTRIL ) 2.5 MG tablet Take 1 tablet (2.5 mg total) by mouth daily. 90 tablet 1   MISC NATURAL PRODUCTS PO Take 1 tablet by mouth daily. Super Beets otc supplement     ondansetron  (ZOFRAN ) 8 MG tablet TAKE 1 TABLET(8 MG) BY MOUTH EVERY 8 HOURS AS NEEDED 30 tablet 0   pantoprazole  (PROTONIX ) 40 MG tablet TAKE 1 TABLET(40 MG) BY MOUTH DAILY 90 tablet 1   potassium chloride  (KLOR-CON  M) 10 MEQ tablet TAKE 1 TABLET BY MOUTH EVERY MONDAY, WEDNESDAY, AND FRIDAY 14 tablet 6   rosuvastatin  (CRESTOR ) 10 MG tablet Take 1 tablet (10 mg total) by mouth daily. 90 tablet 1   sitaGLIPtin  (JANUVIA ) 50 MG tablet TAKE 1 TABLET(50 MG) BY MOUTH DAILY 30 tablet 5   No current facility-administered medications for this visit.    REVIEW OF SYSTEMS:   Constitutional: ( - ) fevers, ( - )  chills , ( - ) night sweats Eyes: ( - ) blurriness of vision, ( - ) double vision, ( - ) watery eyes Ears, nose, mouth, throat, and face: ( - ) mucositis, ( - ) sore throat Respiratory: ( - ) cough, ( - ) dyspnea, ( - ) wheezes Cardiovascular: ( - ) palpitation, ( - ) chest discomfort, ( - ) lower extremity swelling Gastrointestinal:  ( - ) nausea, ( - ) heartburn, ( - ) change in bowel habits Skin: ( - ) abnormal skin rashes Lymphatics: ( - ) new lymphadenopathy, ( - ) easy bruising Neurological: ( - ) numbness, ( - ) tingling, ( - ) new weaknesses Behavioral/Psych: ( - ) mood change, ( - ) new changes  All other systems were reviewed with the patient and are  negative.  PHYSICAL EXAMINATION: ECOG PERFORMANCE STATUS: 1 - Symptomatic but completely ambulatory  Vitals:  04/25/23 1147  BP: 119/76  Pulse: 60  Resp: 13  Temp: 97.8 F (36.6 C)  SpO2: 100%      Filed Weights   04/25/23 1147  Weight: 162 lb 1.6 oz (73.5 kg)       GENERAL: Well-appearing elderly Caucasian female, alert, no distress and comfortable SKIN: skin color, texture, turgor are normal, no rashes or significant lesions EYES: conjunctiva are pink and non-injected, sclera clear LUNGS: clear to auscultation and percussion with normal breathing effort HEART: regular rate & rhythm and no murmurs and no lower extremity edema Musculoskeletal: no cyanosis of digits and no clubbing  PSYCH: alert & oriented x 3, fluent speech NEURO: no focal motor/sensory deficits  LABORATORY DATA:  I have reviewed the data as listed    Latest Ref Rng & Units 04/25/2023   10:32 AM 03/28/2023   11:07 AM 02/28/2023   11:09 AM  CBC  WBC 4.0 - 10.5 K/uL 6.2  6.6  6.3   Hemoglobin 12.0 - 15.0 g/dL 89.3  89.3  88.7   Hematocrit 36.0 - 46.0 % 32.2  33.0  34.9   Platelets 150 - 400 K/uL 146  161  152        Latest Ref Rng & Units 04/25/2023   10:32 AM 03/28/2023   11:07 AM 02/28/2023   11:09 AM  CMP  Glucose 70 - 99 mg/dL 846  806  855   BUN 8 - 23 mg/dL 21  31  24    Creatinine 0.44 - 1.00 mg/dL 8.81  8.65  8.75   Sodium 135 - 145 mmol/L 140  138  140   Potassium 3.5 - 5.1 mmol/L 4.2  4.0  4.6   Chloride 98 - 111 mmol/L 103  105  101   CO2 22 - 32 mmol/L 33  27  33   Calcium  8.9 - 10.3 mg/dL 89.4  89.6  88.8   Total Protein 6.5 - 8.1 g/dL 7.1  7.2  7.8   Total Bilirubin 0.0 - 1.2 mg/dL 0.7  0.6  0.7   Alkaline Phos 38 - 126 U/L 67  72  69   AST 15 - 41 U/L 91  56  60   ALT 0 - 44 U/L 53  28  32    RADIOGRAPHIC STUDIES: No results found.  ASSESSMENT & PLAN Sabrina Deleon 73 y.o. female with medical history significant for newly diagnosed neuroendocrine tumor who presents for a  follow up visit.  #Metastatic Well Differentiated Neuroendocrine Tumor of the GI Tract -- CT imaging and PET CT scan confirmed widespread lymphadenopathy consistent with metastatic spread of well-differentiated neuroendocrine tumor. -- Biopsy confirms this is a well differentiated neuroendocrine tumor of the GI tract. -- Today we will proceed with lanreotide 120 mg IM q. 28 days. --labs today show white blood cell 6.2, hemoglobin 10.6, MCV 91.7, platelets 146 --Due for repeat CT imaging in March 2025. Last scan on 11/30/2022.  If stable continue every 6 month imaging -- Have patient return to clinic in 12 weeks time with interval monthly shots.   # Diarrhea # Elevated LFTs -- Started with the patient's last shot.  She is having some looser stools up to 3/day. -- Encouraged patient to try probiotic therapy to help with her digestive upset. -- May represent a viral infection.  Low suspicion for bacterial infection as there are no other associated symptoms.  If persist in the next visit could order stool sampling. -- Will order labs with next  injection to assess liver function   Hyperglycemia-improved.  -- Appears to be under better control at this time.  Orders Placed This Encounter  Procedures   CT CHEST ABDOMEN PELVIS W CONTRAST    Standing Status:   Future    Expected Date:   05/21/2023    Expiration Date:   04/24/2024    If indicated for the ordered procedure, I authorize the administration of contrast media per Radiology protocol:   Yes    Does the patient have a contrast media/X-ray dye allergy?:   No    Preferred imaging location?:   [facility]    If indicated for the ordered procedure, I authorize the administration of oral contrast media per Radiology protocol:   Yes    All questions were answered. The patient knows to call the clinic with any problems, questions or concerns.  I have spent a total of 30 minutes minutes of face-to-face and non-face-to-face time,  preparing to see the patient, performing a medically appropriate examination, counseling and educating the patient, ordering tests/procedures, referring and communicating with other health care professionals, documenting clinical information in the electronic health record, and care coordination.   Sabrina IVAR Kidney, MD Department of Hematology/Oncology [facility] Phone: [phone] Pager: [phone] Email: [email or web address]  04/25/2023 7:00 PM

## 2023-04-26 ENCOUNTER — Telehealth: Payer: Self-pay | Admitting: Family Medicine

## 2023-04-26 ENCOUNTER — Other Ambulatory Visit (HOSPITAL_COMMUNITY): Payer: Self-pay | Admitting: Nurse Practitioner

## 2023-04-26 DIAGNOSIS — Z1231 Encounter for screening mammogram for malignant neoplasm of breast: Secondary | ICD-10-CM

## 2023-04-26 NOTE — Telephone Encounter (Signed)
 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 Thanks.

## 2023-04-26 NOTE — Telephone Encounter (Signed)
 Copied from CRM [id number]. Topic: Appointments - Scheduling Inquiry for Clinic >> Apr 26, 2023 11:44 AM Farrel B wrote: Reason for CRM: Patient called to schedule her physical and AWV it only comes up with 2026 patient wasn't sure if she'd had her visit as of yet or not, please call patient at [phone]

## 2023-04-30 ENCOUNTER — Ambulatory Visit: Payer: Medicare Other | Attending: Cardiology | Admitting: Cardiology

## 2023-04-30 ENCOUNTER — Encounter: Payer: Self-pay | Admitting: Cardiology

## 2023-04-30 VITALS — BP 112/62 | HR 71 | Ht 68.0 in | Wt 166.0 lb

## 2023-04-30 DIAGNOSIS — I25119 Atherosclerotic heart disease of native coronary artery with unspecified angina pectoris: Secondary | ICD-10-CM | POA: Diagnosis not present

## 2023-04-30 DIAGNOSIS — I350 Nonrheumatic aortic (valve) stenosis: Secondary | ICD-10-CM

## 2023-04-30 DIAGNOSIS — I059 Rheumatic mitral valve disease, unspecified: Secondary | ICD-10-CM | POA: Diagnosis not present

## 2023-04-30 NOTE — Patient Instructions (Addendum)
 Medication Instructions:  Your physician recommends that you continue on your current medications as directed. Please refer to the Current Medication list given to you today.  Labwork: none  Testing/Procedures: Your physician has requested that you have an echocardiogram in September 2025. Echocardiography is a painless test that uses sound waves to create images of your heart. It provides your doctor with information about the size and shape of your heart and how well your heart's chambers and valves are working. This procedure takes approximately one hour. There are no restrictions for this procedure. Please do NOT wear cologne, perfume, aftershave, or lotions (deodorant is allowed). Please arrive 15 minutes prior to your appointment time.  Please note: We ask at that you not bring children with you during ultrasound (echo/ vascular) testing. Due to room size and safety concerns, children are not allowed in the ultrasound rooms during exams. Our front office staff cannot provide observation of children in our lobby area while testing is being conducted. An adult accompanying a patient to their appointment will only be allowed in the ultrasound room at the discretion of the ultrasound technician under special circumstances. We apologize for any inconvenience.  Follow-Up: Your physician recommends that you schedule a follow-up appointment in: September 2025  Any Other Special Instructions Will Be Listed Below (If Applicable).  If you need a refill on your cardiac medications before your next appointment, please call your pharmacy.

## 2023-04-30 NOTE — Progress Notes (Signed)
 Cardiology Office Note  Date: 04/30/2023   ID: Sabrina Deleon, Sabrina Deleon 11-19-1950, MRN [id number]  History of Present Illness: Sabrina Deleon is a 73 y.o. female last seen in August 2024.  She is here for a routine visit.  She reports NYHA class II dyspnea with most activities, no angina.  Does feel fatigued by the end of the day.  No significant edema, no orthopnea or PND.  She enjoys spending time with her granddaughters.  I reviewed her medications.  Current regimen includes Lasix , lisinopril , potassium supplement, and Crestor .  We went over the results of her interval echocardiogram from September 2024.  She continues to prefer observation at this time, does not want to pursue further evaluation for SAVR/TAVR.  Physical Exam: VS:  BP 112/62   Pulse 71   Ht 5\' 8"  (1.727 m)   Wt 166 lb (75.3 kg)   SpO2 100%   BMI 25.24 kg/m , BMI Body mass index is 25.24 kg/m.  Wt Readings from Last 3 Encounters:  04/30/23 166 lb (75.3 kg)  04/25/23 162 lb 1.6 oz (73.5 kg)  01/31/23 163 lb 9.6 oz (74.2 kg)    General: Patient appears comfortable at rest. HEENT: Conjunctiva and lids normal. Neck: Supple, no elevated JVP or carotid bruits. Lungs: Clear to auscultation, nonlabored breathing at rest. Cardiac: Regular rate and rhythm, no S3, 4/6 harsh systolic murmur, no pericardial rub. Extremities: No pitting edema.  ECG:  An ECG dated 06/26/2022 was personally reviewed today and demonstrated:  Sinus bradycardia.  Labwork: 11/27/2022: TSH 0.819 04/25/2023: ALT 53; AST 91; BUN 21; Creatinine 1.18; Hemoglobin 10.6; Platelet Count 146; Potassium 4.2; Sodium 140     Component Value Date/Time   CHOL 111 11/27/2022 0920   TRIG 62 11/27/2022 0920   HDL 57 11/27/2022 0920   CHOLHDL 1.9 11/27/2022 0920   LDLCALC 40 11/27/2022 0920   Other Studies Reviewed Today:  Echocardiogram 12/11/2022:  1. Left ventricular ejection fraction, by estimation, is 55 to 60%. The  left ventricle has normal function.  The left ventricle has no regional  wall motion abnormalities. There is mild left ventricular hypertrophy.  Left ventricular diastolic function  could not be evaluated due to severe mitral annular calcification.   2. Right ventricular systolic function is normal. The right ventricular  size is moderately enlarged. There is normal pulmonary artery systolic  pressure.   3. The mitral valve is abnormal. Mild mitral valve regurgitation.  Moderate mitral stenosis. The mean mitral valve gradient is 7.0 mmHg.  Severe mitral annular calcification.   4. The aortic valve has an indeterminant number of cusps. There is severe  calcifcation of the aortic valve. Aortic valve regurgitation is moderate.  Severe aortic valve stenosis. Aortic regurgitation PHT measures 440 msec.  Aortic valve area, by VTI  measures 0.64 cm. Aortic valve mean gradient measures 56.5 mmHg. Aortic  valve Vmax measures 4.71 m/s.   5. The inferior vena cava is normal in size with greater than 50%  respiratory variability, suggesting right atrial pressure of 3 mmHg.   Assessment and Plan:  1.  Severe calcific aortic stenosis with mean gradient 57 mmHg and dimensionless index 0.2 by echocardiogram in September 2024.  Also moderate aortic regurgitation.  LVEF remains normal at 55 to 60%.  She had previously been evaluated for SAVR by Dr. Sherene Dilling, although has been very hesitant to pursue surgery.  We continue to discuss this over time, she prefers to hold off on further evaluation.  I  have offered to get her set back up for follow-up with Dr. Sherene Dilling regarding whether TAVR would be an option and then conservative management of her mitral valve.  She indicates that she will let me know if she changes her mind.  We will obtain a surveillance echocardiogram in September of this year with clinical follow-up.   2.  Mild, nonobstructive CAD by cardiac catheterization in 2019 during her initial evaluation for aortic stenosis.  Continue  Crestor .   3.  Mitral valve disease, echocardiogram in September 2024 showing mild mitral regurgitation and moderate aortic stenosis with mean MV gradient 7 mmHg.   4.  Asymptomatic carotid artery disease, mild by follow-up carotid Dopplers in September 2024.  Continue Crestor .  Disposition:  Follow up  after echocardiogram in September.  Signed, Gerard Knight, M.D., F.A.C.C. [facility] at [facility]

## 2023-05-14 ENCOUNTER — Ambulatory Visit (HOSPITAL_COMMUNITY)
Admission: RE | Admit: 2023-05-14 | Discharge: 2023-05-14 | Disposition: A | Discharge status: Home / Self Care | Payer: Medicare Other | Source: Ambulatory Visit | Attending: Nurse Practitioner | Admitting: Nurse Practitioner

## 2023-05-14 DIAGNOSIS — Z1231 Encounter for screening mammogram for malignant neoplasm of breast: Secondary | ICD-10-CM | POA: Insufficient documentation

## 2023-05-21 ENCOUNTER — Ambulatory Visit (HOSPITAL_COMMUNITY)
Admission: RE | Admit: 2023-05-21 | Discharge: 2023-05-21 | Disposition: A | Discharge status: Home / Self Care | Payer: Medicare Other | Source: Ambulatory Visit | Attending: Hematology and Oncology | Admitting: Hematology and Oncology

## 2023-05-21 DIAGNOSIS — C7A8 Other malignant neuroendocrine tumors: Secondary | ICD-10-CM | POA: Diagnosis not present

## 2023-05-21 DIAGNOSIS — R1909 Other intra-abdominal and pelvic swelling, mass and lump: Secondary | ICD-10-CM | POA: Diagnosis not present

## 2023-05-21 DIAGNOSIS — K551 Chronic vascular disorders of intestine: Secondary | ICD-10-CM | POA: Diagnosis not present

## 2023-05-21 MED ORDER — IOHEXOL 300 MG/ML  SOLN
80.0000 mL | Freq: Once | INTRAMUSCULAR | Status: AC | PRN
  Administered 2023-05-21: 80 mL via INTRAVENOUS

## 2023-05-23 ENCOUNTER — Inpatient Hospital Stay: Payer: Medicare Other | Attending: Physician Assistant

## 2023-05-23 ENCOUNTER — Inpatient Hospital Stay: Payer: Medicare Other

## 2023-05-23 VITALS — BP 123/59 | HR 66 | Temp 98.0°F | Resp 18

## 2023-05-23 DIAGNOSIS — C7A8 Other malignant neuroendocrine tumors: Secondary | ICD-10-CM

## 2023-05-23 DIAGNOSIS — C7B01 Secondary carcinoid tumors of distant lymph nodes: Secondary | ICD-10-CM | POA: Insufficient documentation

## 2023-05-23 DIAGNOSIS — C7A098 Malignant carcinoid tumors of other sites: Secondary | ICD-10-CM | POA: Insufficient documentation

## 2023-05-23 LAB — CBC WITH DIFFERENTIAL (CANCER CENTER ONLY)
Abs Immature Granulocytes: 0.02 10*3/uL (ref 0.00–0.07)
Basophils Absolute: 0 10*3/uL (ref 0.0–0.1)
Basophils Relative: 1 %
Eosinophils Absolute: 0.3 10*3/uL (ref 0.0–0.5)
Eosinophils Relative: 5 %
HCT: 31.4 % — ABNORMAL LOW (ref 36.0–46.0)
Hemoglobin: 10.2 g/dL — ABNORMAL LOW (ref 12.0–15.0)
Immature Granulocytes: 0 %
Lymphocytes Relative: 19 %
Lymphs Abs: 1.1 10*3/uL (ref 0.7–4.0)
MCH: 30.5 pg (ref 26.0–34.0)
MCHC: 32.5 g/dL (ref 30.0–36.0)
MCV: 94 fL (ref 80.0–100.0)
Monocytes Absolute: 0.5 10*3/uL (ref 0.1–1.0)
Monocytes Relative: 8 %
Neutro Abs: 3.9 10*3/uL (ref 1.7–7.7)
Neutrophils Relative %: 67 %
Platelet Count: 149 10*3/uL — ABNORMAL LOW (ref 150–400)
RBC: 3.34 MIL/uL — ABNORMAL LOW (ref 3.87–5.11)
RDW: 13.3 % (ref 11.5–15.5)
WBC Count: 5.8 10*3/uL (ref 4.0–10.5)
nRBC: 0 % (ref 0.0–0.2)

## 2023-05-23 LAB — CMP (CANCER CENTER ONLY)
ALT: 41 U/L (ref 0–44)
AST: 67 U/L — ABNORMAL HIGH (ref 15–41)
Albumin: 4.2 g/dL (ref 3.5–5.0)
Alkaline Phosphatase: 72 U/L (ref 38–126)
Anion gap: 5 (ref 5–15)
BUN: 33 mg/dL — ABNORMAL HIGH (ref 8–23)
CO2: 31 mmol/L (ref 22–32)
Calcium: 10 mg/dL (ref 8.9–10.3)
Chloride: 103 mmol/L (ref 98–111)
Creatinine: 1.36 mg/dL — ABNORMAL HIGH (ref 0.44–1.00)
GFR, Estimated: 42 mL/min — ABNORMAL LOW (ref >60)
Glucose, Bld: 151 mg/dL — ABNORMAL HIGH (ref 70–99)
Potassium: 4.3 mmol/L (ref 3.5–5.1)
Sodium: 139 mmol/L (ref 135–145)
Total Bilirubin: 0.7 mg/dL (ref 0.0–1.2)
Total Protein: 7.2 g/dL (ref 6.5–8.1)

## 2023-05-23 MED ORDER — LANREOTIDE ACETATE 120 MG/0.5ML ~~LOC~~ SOLN
120.0000 mg | Freq: Once | SUBCUTANEOUS | Status: AC
  Administered 2023-05-23: 120 mg via SUBCUTANEOUS
  Filled 2023-05-23: qty 120

## 2023-05-24 LAB — CHROMOGRANIN A: Chromogranin A (ng/mL): 174.2 ng/mL — ABNORMAL HIGH (ref 0.0–101.8)

## 2023-05-25 ENCOUNTER — Telehealth: Payer: Self-pay | Admitting: *Deleted

## 2023-05-25 NOTE — Telephone Encounter (Signed)
-----   Message from Briant Cedar sent at 05/24/2023 10:34 PM EST ----- Chromogranin levels are trending up. Please request CT scan to be read. ----- Message ----- From: Interface, Lab In [place] Sent: 05/23/2023  11:23 AM EST To: Jaci Standard, MD

## 2023-05-25 NOTE — Telephone Encounter (Signed)
 TCT patient regarding recent lab results.  Spoke with her. Advised that her Chromogranin A is trending up and that we are waiting on the results of her recent CT scan to see if something different needs to be done. Advised that we will call her likely next week once Dr. Leonides Schanz reviews her CT Scan. Pt voiced understanding.

## 2023-05-27 ENCOUNTER — Other Ambulatory Visit: Payer: Self-pay | Admitting: Family Medicine

## 2023-05-28 ENCOUNTER — Telehealth: Payer: Self-pay | Admitting: *Deleted

## 2023-05-28 NOTE — Telephone Encounter (Signed)
 TCT patient regarding recent CT scan. Spoke with her. Advised that overall her scan appears stable. There are some minor changes in the measurements which can happen with different radiologists or scan angles. Overall it does appear reassuringly stable, per Dr. Leonides Schanz. We will see her back as scheduled. She voiced understanding.

## 2023-05-28 NOTE — Telephone Encounter (Signed)
-----   Message from Ulysees Barns IV sent at 05/28/2023  8:23 AM EDT ----- Please let Mrs. Kwasnik know that overall her scan appears stable. There are some minor changes in the measurements which can happen with different radiologists or scan angles. Overall it does appear reassuringly stable. We will see her back as scheduled. ----- Message ----- From: Leory Plowman, Rad Results In Sent: 05/25/2023  10:44 AM EDT To: Jaci Standard, MD

## 2023-05-30 ENCOUNTER — Other Ambulatory Visit: Payer: Self-pay

## 2023-05-30 MED ORDER — IPRATROPIUM BROMIDE 0.03 % NA SOLN: 2.0000 | Freq: Two times a day (BID) | NASAL | 12 refills | Status: AC

## 2023-05-30 MED ORDER — LEVOTHYROXINE SODIUM 125 MCG PO TABS: 125.0000 ug | ORAL_TABLET | Freq: Every day | ORAL | 1 refills | Status: AC

## 2023-05-30 MED ORDER — FUROSEMIDE 40 MG PO TABS: ORAL_TABLET | ORAL | 1 refills | Status: DC

## 2023-05-30 MED ORDER — ROSUVASTATIN CALCIUM 10 MG PO TABS: 10.0000 mg | ORAL_TABLET | Freq: Every day | ORAL | 1 refills | Status: DC

## 2023-05-30 MED ORDER — PANTOPRAZOLE SODIUM 40 MG PO TBEC: DELAYED_RELEASE_TABLET | ORAL | 1 refills | Status: AC

## 2023-06-08 ENCOUNTER — Encounter: Payer: Self-pay | Admitting: Family Medicine

## 2023-06-08 ENCOUNTER — Ambulatory Visit: Payer: Medicare Other | Admitting: Family Medicine

## 2023-06-08 VITALS — BP 124/58 | HR 55 | Temp 97.5°F | Ht 68.0 in | Wt 161.0 lb

## 2023-06-08 DIAGNOSIS — E039 Hypothyroidism, unspecified: Secondary | ICD-10-CM | POA: Diagnosis not present

## 2023-06-08 DIAGNOSIS — E785 Hyperlipidemia, unspecified: Secondary | ICD-10-CM

## 2023-06-08 DIAGNOSIS — N1832 Chronic kidney disease, stage 3b: Secondary | ICD-10-CM

## 2023-06-08 DIAGNOSIS — E1122 Type 2 diabetes mellitus with diabetic chronic kidney disease: Secondary | ICD-10-CM | POA: Diagnosis not present

## 2023-06-08 NOTE — Progress Notes (Signed)
   Subjective:    Patient ID: Sabrina Deleon, female    DOB: 04/11/1950, 73 y.o.   MRN: [id number]  HPI Follow up depression  Patient is undergoing treatments of cancer Does appear that her treatments are having moderate success She takes her allopurinol no gout flareups Uses clonazepam at nighttime to help with rest Duloxetine on a regular basis helps with neuropathy Takes her acid blocker under decent control Takes her thyroid medicine decent control Takes her rosuvastatin tolerates it well Tries to eat healthy Maintaining her weight Depression fair control   Review of Systems     Objective:   Physical Exam   General-in no acute distress Eyes-no discharge Lungs-respiratory rate normal, CTA CV-no murmurs,RRR Extremities skin warm dry no edema Neuro grossly normal Behavior normal, alert      Assessment & Plan:  Cancer under decent control.  She is still going through the treatments. She is holding up as best she can denies severe depression Defers on counseling  Diabetes and hyperlipidemia she will do her lab work in the near future await the results 1. Type 2 diabetes mellitus with stage 3b chronic kidney disease, without long-term current use of insulin (HCC) (Primary) Check lab work in the near future await results continue current measures watch diet - Hemoglobin A1c  2. Hyperlipidemia, unspecified hyperlipidemia type Continue statin check lab work await results - Lipid panel  3. Hypothyroidism, unspecified type Continue thyroid medicine Major depression and mild remission  Follow-up within 5 to 6 months

## 2023-06-09 LAB — LIPID PANEL
Chol/HDL Ratio: 2 ratio (ref 0.0–4.4)
Cholesterol, Total: 118 mg/dL (ref 100–199)
HDL: 59 mg/dL (ref >39)
LDL Chol Calc (NIH): 43 mg/dL (ref 0–99)
Triglycerides: 80 mg/dL (ref 0–149)
VLDL Cholesterol Cal: 16 mg/dL (ref 5–40)

## 2023-06-09 LAB — HEMOGLOBIN A1C
Est. average glucose Bld gHb Est-mCnc: 163 mg/dL
Hgb A1c MFr Bld: 7.3 % — ABNORMAL HIGH (ref 4.8–5.6)

## 2023-06-12 ENCOUNTER — Encounter: Payer: Self-pay | Admitting: Family Medicine

## 2023-06-20 ENCOUNTER — Inpatient Hospital Stay: Payer: Medicare Other | Attending: Physician Assistant

## 2023-06-20 VITALS — BP 129/46 | HR 57 | Temp 98.0°F | Resp 16

## 2023-06-20 DIAGNOSIS — R197 Diarrhea, unspecified: Secondary | ICD-10-CM | POA: Diagnosis not present

## 2023-06-20 DIAGNOSIS — R7989 Other specified abnormal findings of blood chemistry: Secondary | ICD-10-CM | POA: Insufficient documentation

## 2023-06-20 DIAGNOSIS — C7B01 Secondary carcinoid tumors of distant lymph nodes: Secondary | ICD-10-CM | POA: Insufficient documentation

## 2023-06-20 DIAGNOSIS — E1165 Type 2 diabetes mellitus with hyperglycemia: Secondary | ICD-10-CM | POA: Diagnosis not present

## 2023-06-20 DIAGNOSIS — C7A098 Malignant carcinoid tumors of other sites: Secondary | ICD-10-CM | POA: Diagnosis not present

## 2023-06-20 DIAGNOSIS — C7A8 Other malignant neuroendocrine tumors: Secondary | ICD-10-CM

## 2023-06-20 MED ORDER — LANREOTIDE ACETATE 120 MG/0.5ML ~~LOC~~ SOLN
120.0000 mg | Freq: Once | SUBCUTANEOUS | Status: AC
  Administered 2023-06-20: 120 mg via SUBCUTANEOUS
  Filled 2023-06-20: qty 120

## 2023-06-26 ENCOUNTER — Other Ambulatory Visit: Payer: Self-pay | Admitting: Family Medicine

## 2023-07-09 ENCOUNTER — Telehealth: Payer: Self-pay

## 2023-07-09 ENCOUNTER — Other Ambulatory Visit: Payer: Self-pay

## 2023-07-09 MED ORDER — ROSUVASTATIN CALCIUM 10 MG PO TABS: 10.0000 mg | ORAL_TABLET | Freq: Every day | ORAL | 1 refills | Status: DC

## 2023-07-09 MED ORDER — FUROSEMIDE 40 MG PO TABS: ORAL_TABLET | ORAL | 1 refills | Status: DC

## 2023-07-09 NOTE — Telephone Encounter (Signed)
 Prescription Request  07/09/2023  LOV: Visit date not found  What is the name of the medication or equipment? furosemide  (LASIX ) 40 MG tablet  rosuvastatin  (CRESTOR ) 10 MG tablet  Have you contacted your pharmacy to request a refill? Yes   Which pharmacy would you like this sent to?    [facility]    Patient notified that their request is being sent to the clinical staff for review and that they should receive a response within 2 business days.   Please advise at Mobile [phone] (mobile)

## 2023-07-18 ENCOUNTER — Inpatient Hospital Stay: Payer: Medicare Other | Admitting: Hematology and Oncology

## 2023-07-18 ENCOUNTER — Inpatient Hospital Stay: Payer: Medicare Other

## 2023-07-18 VITALS — BP 146/91 | HR 57 | Temp 97.4°F | Resp 16 | Wt 159.4 lb

## 2023-07-18 DIAGNOSIS — K6389 Other specified diseases of intestine: Secondary | ICD-10-CM

## 2023-07-18 DIAGNOSIS — C7A8 Other malignant neuroendocrine tumors: Secondary | ICD-10-CM | POA: Diagnosis not present

## 2023-07-18 DIAGNOSIS — E1165 Type 2 diabetes mellitus with hyperglycemia: Secondary | ICD-10-CM | POA: Diagnosis not present

## 2023-07-18 DIAGNOSIS — R7989 Other specified abnormal findings of blood chemistry: Secondary | ICD-10-CM | POA: Diagnosis not present

## 2023-07-18 DIAGNOSIS — C7B01 Secondary carcinoid tumors of distant lymph nodes: Secondary | ICD-10-CM | POA: Diagnosis not present

## 2023-07-18 DIAGNOSIS — C7A098 Malignant carcinoid tumors of other sites: Secondary | ICD-10-CM | POA: Diagnosis not present

## 2023-07-18 DIAGNOSIS — R197 Diarrhea, unspecified: Secondary | ICD-10-CM | POA: Diagnosis not present

## 2023-07-18 LAB — CBC WITH DIFFERENTIAL (CANCER CENTER ONLY)
Abs Immature Granulocytes: 0.02 10*3/uL (ref 0.00–0.07)
Basophils Absolute: 0 10*3/uL (ref 0.0–0.1)
Basophils Relative: 0 %
Eosinophils Absolute: 0.4 10*3/uL (ref 0.0–0.5)
Eosinophils Relative: 6 %
HCT: 32.8 % — ABNORMAL LOW (ref 36.0–46.0)
Hemoglobin: 10.8 g/dL — ABNORMAL LOW (ref 12.0–15.0)
Immature Granulocytes: 0 %
Lymphocytes Relative: 18 %
Lymphs Abs: 1.2 10*3/uL (ref 0.7–4.0)
MCH: 30.6 pg (ref 26.0–34.0)
MCHC: 32.9 g/dL (ref 30.0–36.0)
MCV: 92.9 fL (ref 80.0–100.0)
Monocytes Absolute: 0.5 10*3/uL (ref 0.1–1.0)
Monocytes Relative: 7 %
Neutro Abs: 4.6 10*3/uL (ref 1.7–7.7)
Neutrophils Relative %: 69 %
Platelet Count: 147 10*3/uL — ABNORMAL LOW (ref 150–400)
RBC: 3.53 MIL/uL — ABNORMAL LOW (ref 3.87–5.11)
RDW: 13.4 % (ref 11.5–15.5)
WBC Count: 6.7 10*3/uL (ref 4.0–10.5)
nRBC: 0 % (ref 0.0–0.2)

## 2023-07-18 LAB — CMP (CANCER CENTER ONLY)
ALT: 30 U/L (ref 0–44)
AST: 57 U/L — ABNORMAL HIGH (ref 15–41)
Albumin: 4.3 g/dL (ref 3.5–5.0)
Alkaline Phosphatase: 74 U/L (ref 38–126)
Anion gap: 4 — ABNORMAL LOW (ref 5–15)
BUN: 25 mg/dL — ABNORMAL HIGH (ref 8–23)
CO2: 33 mmol/L — ABNORMAL HIGH (ref 22–32)
Calcium: 10.7 mg/dL — ABNORMAL HIGH (ref 8.9–10.3)
Chloride: 103 mmol/L (ref 98–111)
Creatinine: 1.27 mg/dL — ABNORMAL HIGH (ref 0.44–1.00)
GFR, Estimated: 45 mL/min — ABNORMAL LOW (ref >60)
Glucose, Bld: 143 mg/dL — ABNORMAL HIGH (ref 70–99)
Potassium: 5.1 mmol/L (ref 3.5–5.1)
Sodium: 140 mmol/L (ref 135–145)
Total Bilirubin: 0.8 mg/dL (ref 0.0–1.2)
Total Protein: 7.2 g/dL (ref 6.5–8.1)

## 2023-07-18 MED ORDER — LANREOTIDE ACETATE 120 MG/0.5ML ~~LOC~~ SOLN
120.0000 mg | Freq: Once | SUBCUTANEOUS | Status: AC
  Administered 2023-07-18: 120 mg via SUBCUTANEOUS
  Filled 2023-07-18: qty 120

## 2023-07-18 NOTE — Progress Notes (Signed)
 [facility] [phone]   [phone]  PROGRESS NOTE  Patient Care Team: Bennet Brasil, MD as PCP - General (Family Medicine) Gerard Knight, MD as PCP - Cardiology (Cardiology)  Hematological/Oncological History # Well Differentiated Neuroendocrine Tumor, GI origin. Grade 2.  11/29/2021: Patient underwent CT angiogram C/A/P prior to consideration of TAVR for severe aortic stenosis, noted to have bulky central mesenteric soft tissue mass that has increased in 2019.  Additionally there was new widespread mild to moderate retroperitoneal, retrocrural, mediastinal and bilateral hilar lymphadenopathy 12/15/2021: NM PET CT Scan showed hypermetabolic mesenteric mass with hypermetabolic adenopathy in the small bowel mesentery, abdominal retroperitoneum and retrocrural stations. 12/28/2021: IR guided biopsy of the mesenteric mass was consistent with well differentiated neuroendocrine tumor 01/10/2022: start of lanreotide 120mg  IM q 28 days.   Interval History:  Sabrina Deleon 73 y.o. female with medical history significant for newly diagnosed neuroendocrine tumor who presents for a follow up visit. The patient's last visit was on 04/25/2023. In the interim since the last visit she continued lanreotide treatment.  On exam today Sabrina Deleon reports that she is "hanging in there".  She reports that she tends to have loose stools for about 2 weeks and then they slow down.  She reports that it continues to look a little loose and mostly "looks like blood".  She has had some urgency and a few accidents.  She notes that she has not taken probiotics as recommended previously.  She also has not taken any over-the-counter medications to try to slow down.  She reports overall she is not having any nausea or vomiting and is willing to continue lanreotide medication.  She reports during her last injection the medication went too fast that she developed a bump that was painful on her  backside.  She reports her energy levels are good but she is quite tired at the end of the day.  She otherwise denies any fevers, chills, sweats.  A full 10 point ROS is otherwise negative.   MEDICAL HISTORY:  Past Medical History:  Diagnosis Date   Anxiety    Aortic stenosis    Arthritis    Chronic kidney disease    Dr. Janette Medley- noted increase kidney function levels- took pt off Metformin  and started on Onglyza for Blood sugar control-pt being followed for this.   GERD (gastroesophageal reflux disease)    Gout    Hypertension    Hypothyroidism    Morbid obesity (HCC) 03/29/2018   Patient has elevated BMI with diabetes hyperlipidemia and hypertension   Pancreatitis    Type 2 diabetes mellitus (HCC)    Wears glasses    Wears partial dentures    Upper    SURGICAL HISTORY: Past Surgical History:  Procedure Laterality Date   BACK SURGERY     Lumbar   CARPAL TUNNEL RELEASE  11/02/2011   Procedure: CARPAL TUNNEL RELEASE;  Surgeon: Milagros Alf, MD;  Location: [facility];  Service: Orthopedics;  Laterality: Right;   CARPAL TUNNEL RELEASE Left 01/14/2015   Procedure: LEFT CARPAL TUNNEL RELEASE;  Surgeon: Brunilda Capra, MD;  Location: [facility];  Service: Orthopedics;  Laterality: Left;   CHOLECYSTECTOMY     laparoscopic   COLONOSCOPY     COLONOSCOPY N/A 11/12/2015   Procedure: COLONOSCOPY;  Surgeon: Ruby Corporal, MD;  Location: [facility];  Service: Endoscopy;  Laterality: N/A;  9:30   ESOPHAGOGASTRODUODENOSCOPY N/A 11/12/2015   Procedure: ESOPHAGOGASTRODUODENOSCOPY (EGD);  Surgeon: Ruby Corporal, MD;  Location: [facility];  Service: Endoscopy;  Laterality: N/A;   REVERSE SHOULDER ARTHROPLASTY Right 11/04/2018   REVERSE SHOULDER ARTHROPLASTY Right 11/04/2018   Procedure: REVERSE SHOULDER ARTHROPLASTY;  Surgeon: Oralia Bills, MD;  Location: [facility];  Service: Orthopedics;  Laterality: Right;   RIGHT/LEFT HEART CATH AND CORONARY  ANGIOGRAPHY N/A 10/18/2017   Procedure: RIGHT/LEFT HEART CATH AND CORONARY ANGIOGRAPHY;  Surgeon: Odie Benne, MD;  Location: [facility];  Service: Cardiovascular;  Laterality: N/A;   TOTAL HIP ARTHROPLASTY Left 03/22/2016   Procedure: LEFT TOTAL HIP ARTHROPLASTY ANTERIOR APPROACH;  Surgeon: Liliane Rei, MD;  Location: [facility];  Service: Orthopedics;  Laterality: Left;    SOCIAL HISTORY: Social History   Socioeconomic History   Marital status: Married    Spouse name: Not on file   Number of children: Not on file   Years of education: Not on file   Highest education level: Some college, no degree  Occupational History   Not on file  Tobacco Use   Smoking status: Former    Current packs/day: 0.00    Types: Cigarettes    Quit date: 10/29/1981    Years since quitting: 41.7   Smokeless tobacco: Never  Vaping Use   Vaping status: Never Used  Substance and Sexual Activity   Alcohol use: Yes    Comment: occasionally   Drug use: No   Sexual activity: Not Currently  Other Topics Concern   Not on file  Social History Narrative   Not on file   Social Drivers of Health   Financial Resource Strain: Low Risk  (01/08/2023)   Overall Financial Resource Strain (CARDIA)    Difficulty of Paying Living Expenses: Not hard at all  Food Insecurity: No Food Insecurity (01/08/2023)   Hunger Vital Sign    Worried About Running Out of Food in the Last Year: Never true    Ran Out of Food in the Last Year: Never true  Transportation Needs: No Transportation Needs (01/08/2023)   PRAPARE - Administrator, Civil Service (Medical): No    Lack of Transportation (Non-Medical): No  Physical Activity: Inactive (01/08/2023)   Exercise Vital Sign    Days of Exercise per Week: 0 days    Minutes of Exercise per Session: 20 min  Stress: No Stress Concern Present (01/08/2023)   [employer] of Occupational Health - Occupational Stress Questionnaire    Feeling of Stress :  Only a little  Social Connections: Socially Integrated (01/08/2023)   Social Connection and Isolation Panel [NHANES]    Frequency of Communication with Friends and Family: More than three times a week    Frequency of Social Gatherings with Friends and Family: More than three times a week    Attends Religious Services: More than 4 times per year    Active Member of [employer] or Organizations: Yes    Attends Engineer, structural: More than 4 times per year    Marital Status: Married  Catering manager Violence: Not on file    FAMILY HISTORY: Family History  Problem Relation Age of Onset   Breast cancer Mother    Lung cancer Mother        likely recurrent breast cancer   Breast cancer Paternal Grandmother    Head & neck cancer Nephew     ALLERGIES:  is allergic to bee venom.  MEDICATIONS:  Current Outpatient Medications  Medication Sig Dispense Refill   lisinopril  (  ZESTRIL ) 20 MG tablet Take 20 mg by mouth daily.     allopurinol  (ZYLOPRIM ) 100 MG tablet Take 1 tablet (100 mg total) by mouth 2 (two) times daily. 180 tablet 1   Cholecalciferol (VITAMIN D) 50 MCG (2000 UT) tablet Take 2,000 Units by mouth daily.     clonazePAM  (KLONOPIN ) 0.5 MG tablet 1 nightly as needed insomnia 30 tablet 5   Colchicine  0.6 MG CAPS Take one cap po BID prn gout flare up 30 capsule 0   DULoxetine  (CYMBALTA ) 30 MG capsule TAKE 1 CAPSULE ONCE DAILY 90 capsule 2   ferrous sulfate  325 (65 FE) MG tablet Take 325 mg by mouth daily with breakfast.     furosemide  (LASIX ) 40 MG tablet TAKE 1 TABLET(40 MG) BY MOUTH DAILY 90 tablet 1   glucose blood (ONETOUCH ULTRA) test strip 1 each by Other route as needed for other. for testing 50 strip 5   glucose blood test strip TEASPOONSFUL ONCE DAILY AS DIRECTED     ipratropium (ATROVENT ) 0.03 % nasal spray Place 2 sprays into both nostrils every 12 (twelve) hours. 30 mL 12   ketoconazole  (NIZORAL ) 2 % cream Apply 1 Application topically daily as needed (fungus). 60 g  0   levothyroxine  (SYNTHROID ) 125 MCG tablet Take 1 tablet (125 mcg total) by mouth daily before breakfast. 90 tablet 1   lisinopril  (ZESTRIL ) 2.5 MG tablet Take 1 tablet (2.5 mg total) by mouth daily. 90 tablet 1   MISC NATURAL PRODUCTS PO Take 1 tablet by mouth daily. Super Beets otc supplement     ondansetron  (ZOFRAN ) 8 MG tablet TAKE 1 TABLET(8 MG) BY MOUTH EVERY 8 HOURS AS NEEDED 30 tablet 0   pantoprazole  (PROTONIX ) 40 MG tablet TAKE 1 TABLET(40 MG) BY MOUTH DAILY 90 tablet 1   potassium chloride  (KLOR-CON  M) 10 MEQ tablet TAKE 1 TABLET BY MOUTH EVERY MONDAY, WEDNESDAY, AND FRIDAY 14 tablet 6   rosuvastatin  (CRESTOR ) 10 MG tablet Take 1 tablet (10 mg total) by mouth daily. 90 tablet 1   sitaGLIPtin  (JANUVIA ) 50 MG tablet TAKE 1 TABLET(50 MG) BY MOUTH DAILY 30 tablet 5   No current facility-administered medications for this visit.    REVIEW OF SYSTEMS:   All other systems were reviewed with the patient and are negative.  PHYSICAL EXAMINATION: ECOG PERFORMANCE STATUS: 1 - Symptomatic but completely ambulatory  Vitals:   07/18/23 1122  BP: (!) 146/91  Pulse: (!) 57  Resp: 16  Temp: (!) 97.4 F (36.3 C)  SpO2: 100%    Filed Weights   07/18/23 1122  Weight: 159 lb 6.4 oz (72.3 kg)   GENERAL: Well-appearing elderly Caucasian female, alert, no distress and comfortable SKIN: skin color, texture, turgor are normal, no rashes or significant lesions EYES: conjunctiva are pink and non-injected, sclera clear LUNGS: clear to auscultation and percussion with normal breathing effort HEART: regular rate & rhythm and no murmurs and no lower extremity edema Musculoskeletal: no cyanosis of digits and no clubbing  PSYCH: alert & oriented x 3, fluent speech NEURO: no focal motor/sensory deficits  LABORATORY DATA:  I have reviewed the data as listed    Latest Ref Rng & Units 07/18/2023   10:58 AM 05/23/2023   11:06 AM 04/25/2023   10:32 AM  CBC  WBC 4.0 - 10.5 K/uL 6.7  5.8  6.2    Hemoglobin 12.0 - 15.0 g/dL 09.8  11.9  14.7   Hematocrit 36.0 - 46.0 % 32.8  31.4  32.2  Platelets 150 - 400 K/uL 147  149  146        Latest Ref Rng & Units 07/18/2023   10:58 AM 05/23/2023   11:06 AM 04/25/2023   10:32 AM  CMP  Glucose 70 - 99 mg/dL 161  096  045   BUN 8 - 23 mg/dL 25  33  21   Creatinine 0.44 - 1.00 mg/dL 4.09  8.11  9.14   Sodium 135 - 145 mmol/L 140  139  140   Potassium 3.5 - 5.1 mmol/L 5.1  4.3  4.2   Chloride 98 - 111 mmol/L 103  103  103   CO2 22 - 32 mmol/L 33  31  33   Calcium  8.9 - 10.3 mg/dL 78.2  95.6  21.3   Total Protein 6.5 - 8.1 g/dL 7.2  7.2  7.1   Total Bilirubin 0.0 - 1.2 mg/dL 0.8  0.7  0.7   Alkaline Phos 38 - 126 U/L 74  72  67   AST 15 - 41 U/L 57  67  91   ALT 0 - 44 U/L 30  41  53    RADIOGRAPHIC STUDIES: No results found.  ASSESSMENT & PLAN Jeff R Cordell 73 y.o. female with medical history significant for newly diagnosed neuroendocrine tumor who presents for a follow up visit.  #Metastatic Well Differentiated Neuroendocrine Tumor of the GI Tract -- CT imaging and PET CT scan confirmed widespread lymphadenopathy consistent with metastatic spread of well-differentiated neuroendocrine tumor. -- Biopsy confirms this is a well differentiated neuroendocrine tumor of the GI tract. -- Today we will proceed with lanreotide 120 mg IM q. 28 days. --labs today show white blood cell 6.7, Hgb 10.8, MCV 92.9, Plt 147  --Due for repeat CT imaging in Sept 2025. Last scan on 05/21/2023.  If stable continue every 6 month imaging -- Have patient return to clinic in 12 weeks time with interval monthly shots.   # Diarrhea -- Started with the patient's last shot.  She is having some looser stools up to 3/day. -- Encouraged patient to try probiotic therapy to help with her digestive upset. --recommend the addition of imodium, if ineffective will prescribe lomotil.   # Elevated LFTs --occurring periodically.  --continue to monitor   #  Hyperglycemia-improved.  -- Appears to be under better control at this time.  No orders of the defined types were placed in this encounter.   All questions were answered. The patient knows to call the clinic with any problems, questions or concerns.  I have spent a total of 30 minutes minutes of face-to-face and non-face-to-face time, preparing to see the patient, performing a medically appropriate examination, counseling and educating the patient, ordering tests/procedures, referring and communicating with other health care professionals, documenting clinical information in the electronic health record, and care coordination.   Rogerio Clay, MD Department of Hematology/Oncology [facility] Phone: [phone] Pager: [phone] Email: [email or web address]  07/18/2023 1:53 PM

## 2023-07-24 ENCOUNTER — Other Ambulatory Visit: Payer: Self-pay | Admitting: Family Medicine

## 2023-07-30 ENCOUNTER — Ambulatory Visit (INDEPENDENT_AMBULATORY_CARE_PROVIDER_SITE_OTHER): Admitting: Nurse Practitioner

## 2023-07-30 VITALS — BP 116/52 | HR 60 | Temp 97.9°F | Ht 68.0 in | Wt 158.0 lb

## 2023-07-30 DIAGNOSIS — N1832 Chronic kidney disease, stage 3b: Secondary | ICD-10-CM

## 2023-07-30 DIAGNOSIS — Z0001 Encounter for general adult medical examination with abnormal findings: Secondary | ICD-10-CM | POA: Diagnosis not present

## 2023-07-30 DIAGNOSIS — Z0000 Encounter for general adult medical examination without abnormal findings: Secondary | ICD-10-CM

## 2023-07-30 DIAGNOSIS — E1122 Type 2 diabetes mellitus with diabetic chronic kidney disease: Secondary | ICD-10-CM | POA: Diagnosis not present

## 2023-07-30 NOTE — Patient Instructions (Signed)
 Vaccines at local pharmacy: Tetanus Shingles

## 2023-07-30 NOTE — Progress Notes (Unsigned)
 Subjective:    Patient ID: Sabrina Deleon, female    DOB: 1950/05/26, 73 y.o.   MRN: [id number]  HPI The patient comes in today for a wellness visit.    A review of their health history was completed.  A review of medications was also completed.  Any needed refills; none  Eating habits: fair; reports decreased appetite  Falls/  MVA accidents in past few months: none  Regular exercise: stays busy   Specialist pt sees on regular basis: oncology, cardiology    Preventative health issues were discussed.   Additional concerns: increased sugar due to cancer treatment; checks her sugar on a regular basis; runs higher at times based on her intake; reports fasting this am 167 Same sexual partner; no vaginal bleeding or pelvic pain Regular vision exams Needs dental exam Colonoscopy, mammogram, bone density all UTD   Review of Systems  Constitutional:  Negative for activity change, appetite change and fatigue.  HENT:  Negative for sore throat and trouble swallowing.   Respiratory:  Negative for cough, chest tightness, shortness of breath and wheezing.   Cardiovascular:  Negative for chest pain.  Gastrointestinal:  Negative for abdominal distention, abdominal pain, constipation, diarrhea, nausea and vomiting.  Genitourinary:  Positive for enuresis, frequency and urgency. Negative for difficulty urinating, dysuria, genital sores, pelvic pain, vaginal bleeding and vaginal discharge.       Denies any rash, lesions or GU irritation.   Social History   Tobacco Use   Smoking status: Former    Current packs/day: 0.00    Types: Cigarettes    Quit date: 10/29/1981    Years since quitting: 41.7   Smokeless tobacco: Never  Vaping Use   Vaping status: Never Used  Substance Use Topics   Alcohol use: Yes    Comment: occasionally   Drug use: No        Objective:   Physical Exam Vitals and nursing note reviewed. Chaperone present: Defers chaperone.  Constitutional:      General:  She is not in acute distress.    Appearance: She is well-developed.  Neck:     Thyroid : No thyromegaly.     Trachea: No tracheal deviation.     Comments: Thyroid  non tender to palpation. No mass or goiter noted.  Cardiovascular:     Rate and Rhythm: Normal rate and regular rhythm.     Heart sounds: Normal heart sounds. No murmur heard. Pulmonary:     Effort: Pulmonary effort is normal.     Breath sounds: Normal breath sounds.  Chest:  Breasts:    Right: No swelling, inverted nipple, mass, skin change or tenderness.     Left: No swelling, inverted nipple, mass, skin change or tenderness.  Abdominal:     General: There is no distension.     Palpations: Abdomen is soft.     Tenderness: There is no abdominal tenderness.  Genitourinary:    Comments: Defers GU and pelvic exams.  Denies any problems. Musculoskeletal:     Cervical back: Normal range of motion and neck supple.  Lymphadenopathy:     Cervical: No cervical adenopathy.     Upper Body:     Right upper body: No supraclavicular, axillary or pectoral adenopathy.     Left upper body: No supraclavicular, axillary or pectoral adenopathy.  Skin:    General: Skin is warm and dry.  Neurological:     Mental Status: She is alert and oriented to person, place, and time.  Psychiatric:  Mood and Affect: Mood normal.        Behavior: Behavior normal.        Thought Content: Thought content normal.        Judgment: Judgment normal.    Today's Vitals   07/30/23 0950  BP: (!) 116/52  Pulse: 60  Temp: 97.9 F (36.6 C)  SpO2: 99%  Weight: 158 lb (71.7 kg)  Height: 5\' 8"  (1.727 m)   Body mass index is 24.02 kg/m.  06/08/2023 hemoglobin A1c 7.3%.       Assessment & Plan:   Problem List Items Addressed This Visit       Endocrine   Type 2 diabetes mellitus (HCC)   Relevant Orders   Microalbumin/Creatinine Ratio, Urine   Other Visit Diagnoses       Well woman exam (no gynecological exam)    -  Primary       Based on Dr. Thayne Fine notes we will hold on increasing or changing diabetes medications at this time.  Increase in sugar is most likely related to her cancer therapy. Urine ACR ordered today. Recommend routine dental exam. Repeat physical in 1 year.  Otherwise routine follow-up for chronic health issues.

## 2023-07-31 ENCOUNTER — Encounter: Payer: Self-pay | Admitting: Nurse Practitioner

## 2023-07-31 LAB — MICROALBUMIN / CREATININE URINE RATIO
Creatinine, Urine: 26.8 mg/dL
Microalb/Creat Ratio: 30 mg/g{creat} — ABNORMAL HIGH (ref 0–29)
Microalbumin, Urine: 8 ug/mL

## 2023-07-31 LAB — SPECIMEN STATUS REPORT

## 2023-08-01 ENCOUNTER — Ambulatory Visit: Payer: Self-pay | Admitting: Nurse Practitioner

## 2023-08-15 ENCOUNTER — Inpatient Hospital Stay: Payer: Medicare Other | Attending: Physician Assistant

## 2023-08-15 VITALS — BP 157/52 | HR 58 | Temp 98.4°F | Resp 18

## 2023-08-15 DIAGNOSIS — C7B01 Secondary carcinoid tumors of distant lymph nodes: Secondary | ICD-10-CM | POA: Insufficient documentation

## 2023-08-15 DIAGNOSIS — C7A8 Other malignant neuroendocrine tumors: Secondary | ICD-10-CM

## 2023-08-15 DIAGNOSIS — C7A098 Malignant carcinoid tumors of other sites: Secondary | ICD-10-CM | POA: Diagnosis not present

## 2023-08-15 MED ORDER — LANREOTIDE ACETATE 120 MG/0.5ML ~~LOC~~ SOLN
120.0000 mg | Freq: Once | SUBCUTANEOUS | Status: AC
  Administered 2023-08-15: 120 mg via SUBCUTANEOUS
  Filled 2023-08-15: qty 120

## 2023-09-03 ENCOUNTER — Other Ambulatory Visit: Payer: Self-pay | Admitting: Family Medicine

## 2023-09-12 ENCOUNTER — Inpatient Hospital Stay: Payer: Medicare Other | Attending: Physician Assistant

## 2023-09-12 VITALS — BP 144/52 | HR 62 | Temp 97.8°F | Resp 17

## 2023-09-12 DIAGNOSIS — C7A098 Malignant carcinoid tumors of other sites: Secondary | ICD-10-CM | POA: Diagnosis not present

## 2023-09-12 DIAGNOSIS — C7B01 Secondary carcinoid tumors of distant lymph nodes: Secondary | ICD-10-CM | POA: Insufficient documentation

## 2023-09-12 DIAGNOSIS — C7A8 Other malignant neuroendocrine tumors: Secondary | ICD-10-CM

## 2023-09-12 MED ORDER — LANREOTIDE ACETATE 120 MG/0.5ML ~~LOC~~ SOLN
120.0000 mg | Freq: Once | SUBCUTANEOUS | Status: AC
  Administered 2023-09-12: 120 mg via SUBCUTANEOUS
  Filled 2023-09-12: qty 120

## 2023-10-03 ENCOUNTER — Telehealth: Payer: Self-pay | Admitting: Family Medicine

## 2023-10-03 MED ORDER — ALLOPURINOL 100 MG PO TABS: 100.0000 mg | ORAL_TABLET | Freq: Two times a day (BID) | ORAL | 1 refills | Status: AC

## 2023-10-03 NOTE — Telephone Encounter (Signed)
 Refill  on allopurinol  (ZYLOPRIM ) 100 MG tablet   [facility]

## 2023-10-05 ENCOUNTER — Other Ambulatory Visit: Payer: Self-pay | Admitting: Family Medicine

## 2023-10-08 ENCOUNTER — Other Ambulatory Visit: Payer: Self-pay

## 2023-10-08 MED ORDER — POTASSIUM CHLORIDE CRYS ER 10 MEQ PO TBCR: EXTENDED_RELEASE_TABLET | ORAL | 6 refills | Status: AC

## 2023-10-09 ENCOUNTER — Other Ambulatory Visit: Payer: Self-pay | Admitting: Hematology and Oncology

## 2023-10-09 DIAGNOSIS — C7A8 Other malignant neuroendocrine tumors: Secondary | ICD-10-CM

## 2023-10-09 NOTE — Progress Notes (Signed)
 [facility] [phone]   [phone]  PROGRESS NOTE  Patient Care Team: Alphonsa Glendia LABOR, MD as PCP - General (Family Medicine) Debera Jayson MATSU, MD as PCP - Cardiology (Cardiology)  Hematological/Oncological History # Well Differentiated Neuroendocrine Tumor, GI origin. Grade 2.  11/29/2021: Patient underwent CT angiogram C/A/P prior to consideration of TAVR for severe aortic stenosis, noted to have bulky central mesenteric soft tissue mass that has increased in 2019.  Additionally there was new widespread mild to moderate retroperitoneal, retrocrural, mediastinal and bilateral hilar lymphadenopathy 12/15/2021: NM PET CT Scan showed hypermetabolic mesenteric mass with hypermetabolic adenopathy in the small bowel mesentery, abdominal retroperitoneum and retrocrural stations. 12/28/2021: IR guided biopsy of the mesenteric mass was consistent with well differentiated neuroendocrine tumor 01/10/2022: start of lanreotide 120mg  IM q 28 days.   Interval History:  Sabrina Deleon 73 y.o. female with medical history significant for newly diagnosed neuroendocrine tumor who presents for a follow up visit. The patient's last visit was on 07/18/2023. In the interim since the last visit she continued lanreotide treatment.  On exam today Sabrina Deleon reports it has been a hot summer.  She reports that she did have a terrible incident in the interim since her last visit where she developed a stomach bug and right upper quadrant pain.  She had a drop in appetite and was losing weight.  She reports that her weight has dropped to 153 pounds.  She notes that she has no nausea or vomiting but has had loose stools about 3 times per day.  She reports that she occasionally has some hot flashes and her face gets red.  She reports that she does have to sleep in the lift chair.  She notes her energy levels are low and she wants to sleep all the time.  She also did recently start Januvia   which may be contributing to her symptoms.  She otherwise denies any fevers, chills, sweats.  A full 10 point ROS is otherwise negative.  MEDICAL HISTORY:  Past Medical History:  Diagnosis Date   Anxiety    Aortic stenosis    Arthritis    Chronic kidney disease    Dr. Alphonsa LEOS- noted increase kidney function levels- took pt off Metformin  and started on Onglyza for Blood sugar control-pt being followed for this.   GERD (gastroesophageal reflux disease)    Gout    Hypertension    Hypothyroidism    Morbid obesity (HCC) 03/29/2018   Patient has elevated BMI with diabetes hyperlipidemia and hypertension   Pancreatitis    Type 2 diabetes mellitus (HCC)    Wears glasses    Wears partial dentures    Upper    SURGICAL HISTORY: Past Surgical History:  Procedure Laterality Date   BACK SURGERY     Lumbar   CARPAL TUNNEL RELEASE  11/02/2011   Procedure: CARPAL TUNNEL RELEASE;  Surgeon: Franky JONELLE Curia, MD;  Location: [facility];  Service: Orthopedics;  Laterality: Right;   CARPAL TUNNEL RELEASE Left 01/14/2015   Procedure: LEFT CARPAL TUNNEL RELEASE;  Surgeon: Franky Curia, MD;  Location: [facility];  Service: Orthopedics;  Laterality: Left;   CHOLECYSTECTOMY     laparoscopic   COLONOSCOPY     COLONOSCOPY N/A 11/12/2015   Procedure: COLONOSCOPY;  Surgeon: Claudis RAYMOND Rivet, MD;  Location: [facility];  Service: Endoscopy;  Laterality: N/A;  9:30   ESOPHAGOGASTRODUODENOSCOPY N/A 11/12/2015   Procedure: ESOPHAGOGASTRODUODENOSCOPY (EGD);  Surgeon: Claudis RAYMOND Rivet, MD;  Location: [facility];  Service: Endoscopy;  Laterality: N/A;   REVERSE SHOULDER ARTHROPLASTY Right 11/04/2018   REVERSE SHOULDER ARTHROPLASTY Right 11/04/2018   Procedure: REVERSE SHOULDER ARTHROPLASTY;  Surgeon: Carolee Lynwood JINNY DOUGLAS, MD;  Location: [facility];  Service: Orthopedics;  Laterality: Right;   RIGHT/LEFT HEART CATH AND CORONARY ANGIOGRAPHY N/A 10/18/2017   Procedure: RIGHT/LEFT HEART CATH  AND CORONARY ANGIOGRAPHY;  Surgeon: Verlin Lonni BIRCH, MD;  Location: [facility];  Service: Cardiovascular;  Laterality: N/A;   TOTAL HIP ARTHROPLASTY Left 03/22/2016   Procedure: LEFT TOTAL HIP ARTHROPLASTY ANTERIOR APPROACH;  Surgeon: Dempsey Moan, MD;  Location: [facility];  Service: Orthopedics;  Laterality: Left;    SOCIAL HISTORY: Social History   Socioeconomic History   Marital status: Married    Spouse name: Not on file   Number of children: Not on file   Years of education: Not on file   Highest education level: Some college, no degree  Occupational History   Not on file  Tobacco Use   Smoking status: Former    Current packs/day: 0.00    Types: Cigarettes    Quit date: 10/29/1981    Years since quitting: 41.9   Smokeless tobacco: Never  Vaping Use   Vaping status: Never Used  Substance and Sexual Activity   Alcohol use: Yes    Comment: occasionally   Drug use: No   Sexual activity: Not Currently  Other Topics Concern   Not on file  Social History Narrative   Not on file   Social Drivers of Health   Financial Resource Strain: Low Risk  (01/08/2023)   Overall Financial Resource Strain (CARDIA)    Difficulty of Paying Living Expenses: Not hard at all  Food Insecurity: No Food Insecurity (01/08/2023)   Hunger Vital Sign    Worried About Running Out of Food in the Last Year: Never true    Ran Out of Food in the Last Year: Never true  Transportation Needs: No Transportation Needs (01/08/2023)   PRAPARE - Administrator, Civil Service (Medical): No    Lack of Transportation (Non-Medical): No  Physical Activity: Unknown (01/08/2023)   Exercise Vital Sign    Days of Exercise per Week: 0 days    Minutes of Exercise per Session: Not on file  Recent Concern: Physical Activity - Inactive (01/08/2023)   Exercise Vital Sign    Days of Exercise per Week: 0 days    Minutes of Exercise per Session: 20 min  Stress: No Stress Concern Present  (01/08/2023)   [employer] of Occupational Health - Occupational Stress Questionnaire    Feeling of Stress : Only a little  Social Connections: Socially Integrated (01/08/2023)   Social Connection and Isolation Panel    Frequency of Communication with Friends and Family: More than three times a week    Frequency of Social Gatherings with Friends and Family: More than three times a week    Attends Religious Services: More than 4 times per year    Active Member of [employer] or Organizations: Yes    Attends Engineer, structural: More than 4 times per year    Marital Status: Married  Catering manager Violence: Not on file    FAMILY HISTORY: Family History  Problem Relation Age of Onset   Breast cancer Mother    Lung cancer Mother        likely recurrent breast cancer   Breast cancer Paternal Grandmother    Head & neck  cancer Nephew     ALLERGIES:  is allergic to bee venom.  MEDICATIONS:  Current Outpatient Medications  Medication Sig Dispense Refill   allopurinol  (ZYLOPRIM ) 100 MG tablet Take 1 tablet (100 mg total) by mouth 2 (two) times daily. 180 tablet 1   Cholecalciferol (VITAMIN D) 50 MCG (2000 UT) tablet Take 2,000 Units by mouth daily.     clonazePAM  (KLONOPIN ) 0.5 MG tablet take 1 tablet at bedtime as needed for insomnia 30 tablet 3   Colchicine  0.6 MG CAPS Take one cap po BID prn gout flare up 30 capsule 0   DULoxetine  (CYMBALTA ) 30 MG capsule TAKE 1 CAPSULE ONCE DAILY 90 capsule 2   ferrous sulfate  325 (65 FE) MG tablet Take 325 mg by mouth daily with breakfast.     furosemide  (LASIX ) 40 MG tablet TAKE 1 TABLET(40 MG) BY MOUTH DAILY 90 tablet 1   glucose blood (ONETOUCH ULTRA) test strip 1 each by Other route as needed for other. for testing 50 strip 5   glucose blood test strip TEASPOONSFUL ONCE DAILY AS DIRECTED     ipratropium (ATROVENT ) 0.03 % nasal spray Place 2 sprays into both nostrils every 12 (twelve) hours. 30 mL 12   ketoconazole  (NIZORAL ) 2 % cream  Apply 1 Application topically daily as needed (fungus). 60 g 0   levothyroxine  (SYNTHROID ) 125 MCG tablet Take 1 tablet (125 mcg total) by mouth daily before breakfast. 90 tablet 1   lisinopril  (ZESTRIL ) 2.5 MG tablet Take 1 tablet (2.5 mg total) by mouth daily. 90 tablet 1   lisinopril  (ZESTRIL ) 20 MG tablet TAKE 1 TABLET ONCE DAILY 90 tablet 0   MISC NATURAL PRODUCTS PO Take 1 tablet by mouth daily. Super Beets otc supplement     ondansetron  (ZOFRAN ) 8 MG tablet TAKE 1 TABLET(8 MG) BY MOUTH EVERY 8 HOURS AS NEEDED 30 tablet 0   pantoprazole  (PROTONIX ) 40 MG tablet TAKE 1 TABLET(40 MG) BY MOUTH DAILY 90 tablet 1   potassium chloride  (KLOR-CON  M) 10 MEQ tablet TAKE 1 TABLET BY MOUTH EVERY MONDAY, WEDNESDAY, AND FRIDAY 14 tablet 6   potassium chloride  (KLOR-CON ) 10 MEQ tablet take 1 tablet by mouth every monday, wednesday, and friday 14 tablet 0   rosuvastatin  (CRESTOR ) 10 MG tablet Take 1 tablet (10 mg total) by mouth daily. 90 tablet 1   sitaGLIPtin  (JANUVIA ) 50 MG tablet TAKE 1 TABLET(50 MG) BY MOUTH DAILY 30 tablet 5   No current facility-administered medications for this visit.    REVIEW OF SYSTEMS:   All other systems were reviewed with the patient and are negative.  PHYSICAL EXAMINATION: ECOG PERFORMANCE STATUS: 1 - Symptomatic but completely ambulatory  Vitals:   10/10/23 1149  BP: (!) 168/63  Pulse: (!) 54  Resp: 14  Temp: 97.7 F (36.5 C)  SpO2: 100%     Filed Weights   10/10/23 1149  Weight: 153 lb (69.4 kg)    GENERAL: Well-appearing elderly Caucasian female, alert, no distress and comfortable SKIN: skin color, texture, turgor are normal, no rashes or significant lesions EYES: conjunctiva are pink and non-injected, sclera clear LUNGS: clear to auscultation and percussion with normal breathing effort HEART: regular rate & rhythm and no murmurs and no lower extremity edema Musculoskeletal: no cyanosis of digits and no clubbing  PSYCH: alert & oriented x 3, fluent  speech NEURO: no focal motor/sensory deficits  LABORATORY DATA:  I have reviewed the data as listed    Latest Ref Rng & Units 10/10/2023  11:17 AM 07/18/2023   10:58 AM 05/23/2023   11:06 AM  CBC  WBC 4.0 - 10.5 K/uL 7.0  6.7  5.8   Hemoglobin 12.0 - 15.0 g/dL 88.9  89.1  89.7   Hematocrit 36.0 - 46.0 % 34.0  32.8  31.4   Platelets 150 - 400 K/uL 155  147  149        Latest Ref Rng & Units 10/10/2023   11:17 AM 07/18/2023   10:58 AM 05/23/2023   11:06 AM  CMP  Glucose 70 - 99 mg/dL 822  856  848   BUN 8 - 23 mg/dL 21  25  33   Creatinine 0.44 - 1.00 mg/dL 8.88  8.72  8.63   Sodium 135 - 145 mmol/L 137  140  139   Potassium 3.5 - 5.1 mmol/L 4.6  5.1  4.3   Chloride 98 - 111 mmol/L 101  103  103   CO2 22 - 32 mmol/L 32  33  31   Calcium  8.9 - 10.3 mg/dL 89.3  89.2  89.9   Total Protein 6.5 - 8.1 g/dL 7.5  7.2  7.2   Total Bilirubin 0.0 - 1.2 mg/dL 0.7  0.8  0.7   Alkaline Phos 38 - 126 U/L 77  74  72   AST 15 - 41 U/L 41  57  67   ALT 0 - 44 U/L 16  30  41    RADIOGRAPHIC STUDIES: No results found.  ASSESSMENT & PLAN Rawan R Grout 73 y.o. female with medical history significant for newly diagnosed neuroendocrine tumor who presents for a follow up visit.  #Metastatic Well Differentiated Neuroendocrine Tumor of the GI Tract -- CT imaging and PET CT scan confirmed widespread lymphadenopathy consistent with metastatic spread of well-differentiated neuroendocrine tumor. -- Biopsy confirms this is a well differentiated neuroendocrine tumor of the GI tract. -- Today we will proceed with lanreotide 120 mg IM q. 28 days. --labs today show white blood cell 7.0, Hgb 11.0, MCV 93.4, Plt 155  --Due for repeat CT imaging in Sept 2025. Last scan on 05/21/2023.  If stable continue every 6 month imaging.  Due to recent flare in symptoms would recommend ordering a CT scan now (earlier than anticipated). -- Have patient return to clinic in 12 weeks time with interval monthly shots.   #  Diarrhea -- Started with the patient's last shot.  She is having some looser stools up to 3/day. -- Encouraged patient to try probiotic therapy to help with her digestive upset. --recommend the addition of imodium, if ineffective will prescribe lomotil.   # Elevated LFTs --occurring periodically.  --continue to monitor   # Hyperglycemia-improved.  -- Appears to be under better control at this time.  Orders Placed This Encounter  Procedures   CT CHEST ABDOMEN PELVIS W CONTRAST    Standing Status:   Future    Expected Date:   10/23/2023    Expiration Date:   10/15/2024    If indicated for the ordered procedure, I authorize the administration of contrast media per Radiology protocol:   Yes    Does the patient have a contrast media/X-ray dye allergy?:   No    Preferred imaging location?:   [facility]    If indicated for the ordered procedure, I authorize the administration of oral contrast media per Radiology protocol:   Yes    All questions were answered. The patient knows to call the clinic with any problems,  questions or concerns.  I have spent a total of 30 minutes minutes of face-to-face and non-face-to-face time, preparing to see the patient, performing a medically appropriate examination, counseling and educating the patient, ordering tests/procedures, referring and communicating with other health care professionals, documenting clinical information in the electronic health record, and care coordination.   Norleen IVAR Kidney, MD Department of Hematology/Oncology [facility] Phone: [phone] Pager: [phone] Email: [email or web address]  10/16/2023 4:54 PM

## 2023-10-10 ENCOUNTER — Inpatient Hospital Stay: Payer: Medicare Other | Attending: Physician Assistant | Admitting: Hematology and Oncology

## 2023-10-10 ENCOUNTER — Inpatient Hospital Stay: Payer: Medicare Other

## 2023-10-10 ENCOUNTER — Inpatient Hospital Stay: Payer: Medicare Other | Attending: Physician Assistant

## 2023-10-10 VITALS — BP 168/63 | HR 54 | Temp 97.7°F | Resp 14 | Wt 153.0 lb

## 2023-10-10 DIAGNOSIS — R7989 Other specified abnormal findings of blood chemistry: Secondary | ICD-10-CM | POA: Insufficient documentation

## 2023-10-10 DIAGNOSIS — C7A098 Malignant carcinoid tumors of other sites: Secondary | ICD-10-CM | POA: Insufficient documentation

## 2023-10-10 DIAGNOSIS — K6389 Other specified diseases of intestine: Secondary | ICD-10-CM

## 2023-10-10 DIAGNOSIS — C7A8 Other malignant neuroendocrine tumors: Secondary | ICD-10-CM

## 2023-10-10 DIAGNOSIS — E1165 Type 2 diabetes mellitus with hyperglycemia: Secondary | ICD-10-CM | POA: Diagnosis not present

## 2023-10-10 DIAGNOSIS — C7B01 Secondary carcinoid tumors of distant lymph nodes: Secondary | ICD-10-CM | POA: Insufficient documentation

## 2023-10-10 DIAGNOSIS — R197 Diarrhea, unspecified: Secondary | ICD-10-CM | POA: Insufficient documentation

## 2023-10-10 LAB — CBC WITH DIFFERENTIAL (CANCER CENTER ONLY)
Abs Immature Granulocytes: 0.01 K/uL (ref 0.00–0.07)
Basophils Absolute: 0 K/uL (ref 0.0–0.1)
Basophils Relative: 0 %
Eosinophils Absolute: 0.2 K/uL (ref 0.0–0.5)
Eosinophils Relative: 3 %
HCT: 34 % — ABNORMAL LOW (ref 36.0–46.0)
Hemoglobin: 11 g/dL — ABNORMAL LOW (ref 12.0–15.0)
Immature Granulocytes: 0 %
Lymphocytes Relative: 17 %
Lymphs Abs: 1.2 K/uL (ref 0.7–4.0)
MCH: 30.2 pg (ref 26.0–34.0)
MCHC: 32.4 g/dL (ref 30.0–36.0)
MCV: 93.4 fL (ref 80.0–100.0)
Monocytes Absolute: 0.5 K/uL (ref 0.1–1.0)
Monocytes Relative: 7 %
Neutro Abs: 5.1 K/uL (ref 1.7–7.7)
Neutrophils Relative %: 73 %
Platelet Count: 155 K/uL (ref 150–400)
RBC: 3.64 MIL/uL — ABNORMAL LOW (ref 3.87–5.11)
RDW: 13.1 % (ref 11.5–15.5)
WBC Count: 7 K/uL (ref 4.0–10.5)
nRBC: 0 % (ref 0.0–0.2)

## 2023-10-10 LAB — CMP (CANCER CENTER ONLY)
ALT: 16 U/L (ref 0–44)
AST: 41 U/L (ref 15–41)
Albumin: 4.3 g/dL (ref 3.5–5.0)
Alkaline Phosphatase: 77 U/L (ref 38–126)
Anion gap: 4 — ABNORMAL LOW (ref 5–15)
BUN: 21 mg/dL (ref 8–23)
CO2: 32 mmol/L (ref 22–32)
Calcium: 10.6 mg/dL — ABNORMAL HIGH (ref 8.9–10.3)
Chloride: 101 mmol/L (ref 98–111)
Creatinine: 1.11 mg/dL — ABNORMAL HIGH (ref 0.44–1.00)
GFR, Estimated: 53 mL/min — ABNORMAL LOW (ref >60)
Glucose, Bld: 177 mg/dL — ABNORMAL HIGH (ref 70–99)
Potassium: 4.6 mmol/L (ref 3.5–5.1)
Sodium: 137 mmol/L (ref 135–145)
Total Bilirubin: 0.7 mg/dL (ref 0.0–1.2)
Total Protein: 7.5 g/dL (ref 6.5–8.1)

## 2023-10-10 LAB — LACTATE DEHYDROGENASE: LDH: 154 U/L (ref 98–192)

## 2023-10-10 MED ORDER — LANREOTIDE ACETATE 120 MG/0.5ML ~~LOC~~ SOLN
120.0000 mg | Freq: Once | SUBCUTANEOUS | Status: AC
  Administered 2023-10-10: 120 mg via SUBCUTANEOUS
  Filled 2023-10-10: qty 120

## 2023-10-12 ENCOUNTER — Other Ambulatory Visit: Payer: Self-pay | Admitting: Family Medicine

## 2023-10-12 LAB — CHROMOGRANIN A: Chromogranin A (ng/mL): 190.2 ng/mL — ABNORMAL HIGH (ref 0.0–101.8)

## 2023-10-16 ENCOUNTER — Encounter: Payer: Self-pay | Admitting: Hematology and Oncology

## 2023-10-24 ENCOUNTER — Ambulatory Visit (HOSPITAL_COMMUNITY)
Admission: RE | Admit: 2023-10-24 | Discharge: 2023-10-24 | Disposition: A | Discharge status: Home / Self Care | Source: Ambulatory Visit | Attending: Hematology and Oncology | Admitting: Hematology and Oncology

## 2023-10-24 ENCOUNTER — Other Ambulatory Visit: Payer: Self-pay | Admitting: Family Medicine

## 2023-10-24 DIAGNOSIS — R1909 Other intra-abdominal and pelvic swelling, mass and lump: Secondary | ICD-10-CM | POA: Diagnosis not present

## 2023-10-24 DIAGNOSIS — R590 Localized enlarged lymph nodes: Secondary | ICD-10-CM | POA: Diagnosis not present

## 2023-10-24 DIAGNOSIS — C7A8 Other malignant neuroendocrine tumors: Secondary | ICD-10-CM | POA: Diagnosis not present

## 2023-10-24 DIAGNOSIS — E1122 Type 2 diabetes mellitus with diabetic chronic kidney disease: Secondary | ICD-10-CM

## 2023-10-24 DIAGNOSIS — N2889 Other specified disorders of kidney and ureter: Secondary | ICD-10-CM | POA: Diagnosis not present

## 2023-10-24 MED ORDER — IOHEXOL 300 MG/ML  SOLN
100.0000 mL | Freq: Once | INTRAMUSCULAR | Status: AC | PRN
  Administered 2023-10-24: 100 mL via INTRAVENOUS

## 2023-10-29 ENCOUNTER — Telehealth: Payer: Self-pay

## 2023-10-29 NOTE — Telephone Encounter (Signed)
 Spoke with patient regarding recent CT scan that has not yet been resulted. Informed patient that a call will be made to [facility] Radiology to request review of the CT scan per her request. Patient voiced understanding.

## 2023-11-07 ENCOUNTER — Inpatient Hospital Stay: Attending: Physician Assistant

## 2023-11-07 ENCOUNTER — Ambulatory Visit: Payer: Self-pay | Admitting: *Deleted

## 2023-11-07 VITALS — BP 158/50 | HR 57 | Temp 97.5°F | Resp 16

## 2023-11-07 DIAGNOSIS — C7B01 Secondary carcinoid tumors of distant lymph nodes: Secondary | ICD-10-CM | POA: Insufficient documentation

## 2023-11-07 DIAGNOSIS — C7A098 Malignant carcinoid tumors of other sites: Secondary | ICD-10-CM | POA: Insufficient documentation

## 2023-11-07 DIAGNOSIS — C7A8 Other malignant neuroendocrine tumors: Secondary | ICD-10-CM

## 2023-11-07 MED ORDER — LANREOTIDE ACETATE 120 MG/0.5ML ~~LOC~~ SOLN
120.0000 mg | Freq: Once | SUBCUTANEOUS | Status: AC
  Administered 2023-11-07: 120 mg via SUBCUTANEOUS
  Filled 2023-11-07: qty 120

## 2023-11-07 NOTE — Telephone Encounter (Signed)
 TCT patient regarding recent CT scan. Spoke with her. Advised that her CT scan showed stable disease, with no spread or progression. We will continue on her current treatment.  She is concerned that she is losing weight and has some other symptoms that she thinks are related to the Januvia  her PCP put her on. She will talk to him about that next week. She knows to call with any concerns/questions.

## 2023-11-07 NOTE — Telephone Encounter (Signed)
-----   Message from Norleen ONEIDA Kidney IV sent at 11/07/2023  7:34 AM EDT ----- Please let Sabrina Deleon know that her CT scan showed stable disease, with no spread or progression. We will continue on her current treatment.  ----- Message ----- From: Interface, Rad Results In Sent: 11/06/2023   9:18 PM EDT To: Norleen ONEIDA Kidney [place]AD[place], MD

## 2023-11-09 ENCOUNTER — Ambulatory Visit

## 2023-11-09 NOTE — Progress Notes (Signed)
 Pharmacy Quality Measure Review  This patient is appearing on a report for being at risk of failing the adherence measure for cholesterol (statin) medications this calendar year.   Medication: rosuvastatin  10 mg daily Last fill date: 10/31/2023 for 30 day supply  Insurance report was not up to date. No action needed at this time.  Patient fills at [facility], which only fills for 30 day supply.   Woodie Jock, PharmD PGY1 Pharmacy Resident

## 2023-11-16 ENCOUNTER — Ambulatory Visit (INDEPENDENT_AMBULATORY_CARE_PROVIDER_SITE_OTHER): Admitting: Nurse Practitioner

## 2023-11-16 VITALS — BP 124/78 | HR 52 | Temp 97.7°F | Wt 148.0 lb

## 2023-11-16 DIAGNOSIS — D492 Neoplasm of unspecified behavior of bone, soft tissue, and skin: Secondary | ICD-10-CM

## 2023-11-16 MED ORDER — CLOBETASOL PROPIONATE 0.05 % EX CREA: 1.0000 | TOPICAL_CREAM | Freq: Two times a day (BID) | CUTANEOUS | 0 refills | Status: AC

## 2023-11-16 NOTE — Progress Notes (Signed)
 Error I have seen and examined this patient alongside the NP student. I have reviewed and verified the student note and agree with the assessment and plan.  Elveria Quarry, FNP

## 2023-11-16 NOTE — Progress Notes (Signed)
   Subjective:    Patient ID: Sabrina Deleon, female    DOB: 1950-12-29, 73 y.o.   MRN: [id number]  CC: Presents for Acute Visit for Rash  Rash This is a new problem. The current episode started more than 1 month ago (about 4 months ago). The problem is unchanged. The affected locations include the left buttock (within folds). The rash is characterized by burning, dryness and pain. She was exposed to nothing. Pertinent negatives include no cough or shortness of breath. Treatments tried: neosporin and diaper rash cream. The treatment provided mild relief.  Symptoms began after significant weight loss and increase in skin folds.      Review of Systems  Respiratory:  Negative for cough, shortness of breath and wheezing.   Cardiovascular:  Negative for chest pain.  Skin:  Positive for rash.       On L. Buttock near the gluteal fold       Objective:   Physical Exam Constitutional:      Appearance: Normal appearance.  Cardiovascular:     Rate and Rhythm: Normal rate.     Heart sounds: Murmur heard.  Skin:    General: Skin is warm and dry.     Findings: Rash present.     Comments: 0.5 cm, white, raised, circular lesion on the L buttock near gluteal fold. Minimal erythema noted.   Neurological:     Mental Status: She is alert and oriented to person, place, and time.  Psychiatric:        Mood and Affect: Mood normal.        Behavior: Behavior normal.        Thought Content: Thought content normal.       Today's Vitals   11/16/23 1117  BP: 124/78  Pulse: (!) 52  Temp: 97.7 F (36.5 C)  SpO2: 100%  Weight: 148 lb (67.1 kg)   Body mass index is 22.5 kg/m.       Assessment & Plan:   Problem List Items Addressed This Visit       Musculoskeletal and Integument   Atypical squamoproliferative skin lesion - Primary   Relevant Orders   Ambulatory referral to Dermatology    Meds ordered this encounter  Medications   clobetasol  cream (TEMOVATE ) 0.05 %    Sig: Apply 1  Application topically 2 (two) times daily.    Dispense:  30 g    Refill:  0    Supervising Provider:   ALPHONSA HAMILTON A [[id number]]   Patient was educated on applying clobetasol  cream bid as directed to lesion for the next few weeks.  Referral to Dermatology for evaluation of lesion.  Follow up appointment in 2 weeks with PCP.  I have seen and examined this patient alongside the NP student. I have reviewed and verified the student note and agree with the assessment and plan.  Elveria Quarry, FNP

## 2023-11-17 ENCOUNTER — Encounter: Payer: Self-pay | Admitting: Nurse Practitioner

## 2023-11-17 DIAGNOSIS — D492 Neoplasm of unspecified behavior of bone, soft tissue, and skin: Secondary | ICD-10-CM | POA: Insufficient documentation

## 2023-11-20 ENCOUNTER — Telehealth: Payer: Self-pay | Admitting: *Deleted

## 2023-11-20 NOTE — Telephone Encounter (Signed)
 Opened in error

## 2023-11-21 ENCOUNTER — Other Ambulatory Visit: Payer: Self-pay | Admitting: *Deleted

## 2023-11-21 ENCOUNTER — Telehealth: Payer: Self-pay | Admitting: *Deleted

## 2023-11-21 MED ORDER — ONDANSETRON HCL 8 MG PO TABS: 8.0000 mg | ORAL_TABLET | Freq: Three times a day (TID) | ORAL | 1 refills | Status: DC | PRN

## 2023-11-21 NOTE — Telephone Encounter (Signed)
 TCT patient  regarding her symptoms after her last lanreotide. Spoke with her on the phone for a few minutes but then we were disconnected. Unable to reach her again. Attempted calling her husband-no answer and no vm. Sent pt a MyChart message as well, asked her to call back tomorrow, 11/22/23. I also refilled her Zofran .

## 2023-11-22 ENCOUNTER — Other Ambulatory Visit: Payer: Self-pay

## 2023-11-22 ENCOUNTER — Inpatient Hospital Stay: Attending: Physician Assistant | Admitting: Physician Assistant

## 2023-11-22 ENCOUNTER — Inpatient Hospital Stay

## 2023-11-22 ENCOUNTER — Ambulatory Visit

## 2023-11-22 ENCOUNTER — Other Ambulatory Visit: Payer: Self-pay | Admitting: *Deleted

## 2023-11-22 VITALS — BP 119/77 | HR 74 | Temp 97.5°F | Resp 16 | Ht 68.0 in | Wt 145.7 lb

## 2023-11-22 DIAGNOSIS — C7A098 Malignant carcinoid tumors of other sites: Secondary | ICD-10-CM | POA: Insufficient documentation

## 2023-11-22 DIAGNOSIS — C7A8 Other malignant neuroendocrine tumors: Secondary | ICD-10-CM

## 2023-11-22 DIAGNOSIS — R1010 Upper abdominal pain, unspecified: Secondary | ICD-10-CM | POA: Insufficient documentation

## 2023-11-22 DIAGNOSIS — R112 Nausea with vomiting, unspecified: Secondary | ICD-10-CM | POA: Insufficient documentation

## 2023-11-22 DIAGNOSIS — K5900 Constipation, unspecified: Secondary | ICD-10-CM | POA: Diagnosis not present

## 2023-11-22 LAB — CMP (CANCER CENTER ONLY)
ALT: 27 U/L (ref 0–44)
AST: 53 U/L — ABNORMAL HIGH (ref 15–41)
Albumin: 4.5 g/dL (ref 3.5–5.0)
Alkaline Phosphatase: 75 U/L (ref 38–126)
Anion gap: 5 (ref 5–15)
BUN: 23 mg/dL (ref 8–23)
CO2: 33 mmol/L — ABNORMAL HIGH (ref 22–32)
Calcium: 11 mg/dL — ABNORMAL HIGH (ref 8.9–10.3)
Chloride: 100 mmol/L (ref 98–111)
Creatinine: 1.24 mg/dL — ABNORMAL HIGH (ref 0.44–1.00)
GFR, Estimated: 46 mL/min — ABNORMAL LOW (ref >60)
Glucose, Bld: 199 mg/dL — ABNORMAL HIGH (ref 70–99)
Potassium: 4.8 mmol/L (ref 3.5–5.1)
Sodium: 138 mmol/L (ref 135–145)
Total Bilirubin: 0.8 mg/dL (ref 0.0–1.2)
Total Protein: 7.8 g/dL (ref 6.5–8.1)

## 2023-11-22 LAB — CBC WITH DIFFERENTIAL (CANCER CENTER ONLY)
Abs Immature Granulocytes: 0.01 K/uL (ref 0.00–0.07)
Basophils Absolute: 0 K/uL (ref 0.0–0.1)
Basophils Relative: 1 %
Eosinophils Absolute: 0.3 K/uL (ref 0.0–0.5)
Eosinophils Relative: 4 %
HCT: 35.1 % — ABNORMAL LOW (ref 36.0–46.0)
Hemoglobin: 11.7 g/dL — ABNORMAL LOW (ref 12.0–15.0)
Immature Granulocytes: 0 %
Lymphocytes Relative: 18 %
Lymphs Abs: 1.1 K/uL (ref 0.7–4.0)
MCH: 30.8 pg (ref 26.0–34.0)
MCHC: 33.3 g/dL (ref 30.0–36.0)
MCV: 92.4 fL (ref 80.0–100.0)
Monocytes Absolute: 0.4 K/uL (ref 0.1–1.0)
Monocytes Relative: 6 %
Neutro Abs: 4.5 K/uL (ref 1.7–7.7)
Neutrophils Relative %: 71 %
Platelet Count: 158 K/uL (ref 150–400)
RBC: 3.8 MIL/uL — ABNORMAL LOW (ref 3.87–5.11)
RDW: 13.7 % (ref 11.5–15.5)
WBC Count: 6.3 K/uL (ref 4.0–10.5)
nRBC: 0 % (ref 0.0–0.2)

## 2023-11-22 NOTE — Progress Notes (Signed)
 Lab orders placed per Mallie ORN PA-C for [facility] appt today.

## 2023-11-22 NOTE — Progress Notes (Signed)
 Symptom Management Consult Note [facility]    Patient Care Team: Alphonsa Glendia LABOR, MD as PCP - General (Family Medicine) Debera Jayson MATSU, MD as PCP - Cardiology (Cardiology)    Name / MRN / DOB: Sabrina Deleon  [id number]  03-16-1951   Date of visit: 11/22/2023   Chief Complaint/Reason for visit: nausea   Current Therapy: lanreotide acetate    Last treatment: Treatment 24 on 11/07/23   ASSESSMENT AND PLAN Patient is a 73 y.o. female with oncologic history of Well Differentiated Neuroendocrine Tumor followed by Dr. Federico.  I have viewed most recent oncology note and lab work.  #Well Differentiated Neuroendocrine Tumor  - Next appointment with oncologist is 01/02/24  #Nausea and vomiting - Intermittent nausea and vomiting managed effectively with Zofran . No clear pattern related to food intake. - Benign abdominal exam. Acute on chronic per HPI. No new findings on recent CT [facility] on 10/24/23.  CMP similar to baseline values. Prescription for zofran  was sent in yesterday by oncologist.   #Constipation -Constipation possibly related to increased Zofran  use.  - Recommend using Miralax  more regularly to manage constipation. Can consider using over-the-counter stool softeners like Senokot or Colace if needed. - Symptoms not suggestive of SBO, had small BM today and is passing flatus.    Strict ED precautions discussed should symptoms worsen.   HEME/ONC HISTORY Oncology History   No history exists.      INTERVAL HISTORY  Discussed the use of AI scribe software for clinical note transcription with the patient, who gave verbal consent to proceed.    Sabrina Deleon is a 73 y.o. female with oncologic history of well differentiated neuroendocrine tumor presenting to [facility] today with chief complaint of nausea. Accompanied to clinic today by spouse who provides additional history.  She has been experiencing intermittent nausea for several months, with rare episodes  of emesis. No vomiting today. The nausea can last all day, and she sometimes forces herself to eat due to lack of appetite. Zofran , taken in the morning and occasionally in the afternoon, provides relief. She denies any fever, chills, urinary symptoms.  She has experienced constipation for the last few days, which she attributes to reduced food intake. Her last bowel movement was earlier today and she describes it as small, hard fragmented pieces. She occasionally uses Miralax , mixed in her coffee, but has not tried stool softeners like Senokot or Colace. She reports upper abdominal pain radiating to her sides and back, worsening over the last two to three months.  She reports that a CT scan was performed a month ago.   She started taking Januvia  about six months ago which is when her nausea worsened as well. She has appointment with PCP in 2 weeks to discuss medications.        ROS  All other systems are reviewed and are negative for acute change except as noted in the HPI.    Allergies  Allergen Reactions   Bee Venom Other (See Comments)     Past Medical History:  Diagnosis Date   Anxiety    Aortic stenosis    Arthritis    Chronic kidney disease    Dr. Alphonsa LEOS- noted increase kidney function levels- took pt off Metformin  and started on Onglyza for Blood sugar control-pt being followed for this.   GERD (gastroesophageal reflux disease)    Gout    Hypertension    Hypothyroidism    Morbid obesity (HCC) 03/29/2018   Patient has  elevated BMI with diabetes hyperlipidemia and hypertension   Pancreatitis    Type 2 diabetes mellitus (HCC)    Wears glasses    Wears partial dentures    Upper     Past Surgical History:  Procedure Laterality Date   BACK SURGERY     Lumbar   CARPAL TUNNEL RELEASE  11/02/2011   Procedure: CARPAL TUNNEL RELEASE;  Surgeon: Franky JONELLE Curia, MD;  Location: [facility];  Service: Orthopedics;  Laterality: Right;   CARPAL TUNNEL RELEASE  Left 01/14/2015   Procedure: LEFT CARPAL TUNNEL RELEASE;  Surgeon: Franky Curia, MD;  Location: [facility];  Service: Orthopedics;  Laterality: Left;   CHOLECYSTECTOMY     laparoscopic   COLONOSCOPY     COLONOSCOPY N/A 11/12/2015   Procedure: COLONOSCOPY;  Surgeon: Claudis RAYMOND Rivet, MD;  [facility];  Service: Endoscopy;  Laterality: N/A;  9:30   ESOPHAGOGASTRODUODENOSCOPY N/A 11/12/2015   Procedure: ESOPHAGOGASTRODUODENOSCOPY (EGD);  Surgeon: Claudis RAYMOND Rivet, MD;  Location: [facility];  Service: Endoscopy;  Laterality: N/A;   REVERSE SHOULDER ARTHROPLASTY Right 11/04/2018   REVERSE SHOULDER ARTHROPLASTY Right 11/04/2018   Procedure: REVERSE SHOULDER ARTHROPLASTY;  Surgeon: Carolee Lynwood JINNY DOUGLAS, MD;  Location: [facility];  Service: Orthopedics;  Laterality: Right;   RIGHT/LEFT HEART CATH AND CORONARY ANGIOGRAPHY N/A 10/18/2017   Procedure: RIGHT/LEFT HEART CATH AND CORONARY ANGIOGRAPHY;  Surgeon: Verlin Lonni BIRCH, MD;  Location: [facility];  Service: Cardiovascular;  Laterality: N/A;   TOTAL HIP ARTHROPLASTY Left 03/22/2016   Procedure: LEFT TOTAL HIP ARTHROPLASTY ANTERIOR APPROACH;  Surgeon: Dempsey Moan, MD;  Location: [facility];  Service: Orthopedics;  Laterality: Left;    Social History   Socioeconomic History   Marital status: Married    Spouse name: Not on file   Number of children: Not on file   Years of education: Not on file   Highest education level: Some college, no degree  Occupational History   Not on file  Tobacco Use   Smoking status: Former    Current packs/day: 0.00    Types: Cigarettes    Quit date: 10/29/1981    Years since quitting: 42.0   Smokeless tobacco: Never  Vaping Use   Vaping status: Never Used  Substance and Sexual Activity   Alcohol use: Yes    Comment: occasionally   Drug use: No   Sexual activity: Not Currently  Other Topics Concern   Not on file  Social History Narrative   Not on file   Social Drivers of  Health   Financial Resource Strain: Low Risk  (01/08/2023)   Overall Financial Resource Strain (CARDIA)    Difficulty of Paying Living Expenses: Not hard at all  Food Insecurity: No Food Insecurity (01/08/2023)   Hunger Vital Sign    Worried About Running Out of Food in the Last Year: Never true    Ran Out of Food in the Last Year: Never true  Transportation Needs: No Transportation Needs (01/08/2023)   PRAPARE - Administrator, Civil Service (Medical): No    Lack of Transportation (Non-Medical): No  Physical Activity: Unknown (01/08/2023)   Exercise Vital Sign    Days of Exercise per Week: 0 days    Minutes of Exercise per Session: Not on file  Recent Concern: Physical Activity - Inactive (01/08/2023)   Exercise Vital Sign    Days of Exercise per Week: 0 days    Minutes of Exercise per Session: 20  min  Stress: No Stress Concern Present (01/08/2023)   [employer] of Occupational Health - Occupational Stress Questionnaire    Feeling of Stress : Only a little  Social Connections: Socially Integrated (01/08/2023)   Social Connection and Isolation Panel    Frequency of Communication with Friends and Family: More than three times a week    Frequency of Social Gatherings with Friends and Family: More than three times a week    Attends Religious Services: More than 4 times per year    Active Member of [employer] or Organizations: Yes    Attends Engineer, structural: More than 4 times per year    Marital Status: Married  Catering manager Violence: Not on file    Family History  Problem Relation Age of Onset   Breast cancer Mother    Lung cancer Mother        likely recurrent breast cancer   Breast cancer Paternal Grandmother    Head & neck cancer Nephew      Current Outpatient Medications:    allopurinol  (ZYLOPRIM ) 100 MG tablet, Take 1 tablet (100 mg total) by mouth 2 (two) times daily., Disp: 180 tablet, Rfl: 1   Cholecalciferol (VITAMIN D) 50 MCG (2000  UT) tablet, Take 2,000 Units by mouth daily., Disp: , Rfl:    clobetasol  cream (TEMOVATE ) 0.05 %, Apply 1 Application topically 2 (two) times daily., Disp: 30 g, Rfl: 0   clonazePAM  (KLONOPIN ) 0.5 MG tablet, take 1 tablet at bedtime as needed for insomnia, Disp: 30 tablet, Rfl: 3   Colchicine  0.6 MG CAPS, Take one cap po BID prn gout flare up, Disp: 30 capsule, Rfl: 0   DULoxetine  (CYMBALTA ) 30 MG capsule, TAKE 1 CAPSULE ONCE DAILY, Disp: 90 capsule, Rfl: 2   ferrous sulfate  325 (65 FE) MG tablet, Take 325 mg by mouth daily with breakfast., Disp: , Rfl:    furosemide  (LASIX ) 40 MG tablet, TAKE 1 TABLET(40 MG) BY MOUTH DAILY, Disp: 90 tablet, Rfl: 1   glucose blood (ONETOUCH ULTRA) test strip, 1 each by Other route as needed for other. for testing, Disp: 50 strip, Rfl: 5   ipratropium (ATROVENT ) 0.03 % nasal spray, Place 2 sprays into both nostrils every 12 (twelve) hours., Disp: 30 mL, Rfl: 12   JANUVIA  50 MG tablet, TAKE 1 TABLET(50 MG) BY MOUTH DAILY, Disp: 30 tablet, Rfl: 5   ketoconazole  (NIZORAL ) 2 % cream, Apply 1 Application topically daily as needed (fungus)., Disp: 60 g, Rfl: 0   levothyroxine  (SYNTHROID ) 125 MCG tablet, Take 1 tablet (125 mcg total) by mouth daily before breakfast., Disp: 90 tablet, Rfl: 1   lisinopril  (ZESTRIL ) 2.5 MG tablet, Take 1 tablet (2.5 mg total) by mouth daily., Disp: 90 tablet, Rfl: 1   lisinopril  (ZESTRIL ) 20 MG tablet, TAKE 1 TABLET ONCE DAILY, Disp: 90 tablet, Rfl: 0   MISC NATURAL PRODUCTS PO, Take 1 tablet by mouth daily. Super Beets otc supplement, Disp: , Rfl:    ondansetron  (ZOFRAN ) 8 MG tablet, Take 1 tablet (8 mg total) by mouth every 8 (eight) hours as needed for nausea or vomiting., Disp: 30 tablet, Rfl: 1   pantoprazole  (PROTONIX ) 40 MG tablet, TAKE 1 TABLET(40 MG) BY MOUTH DAILY, Disp: 90 tablet, Rfl: 1   potassium chloride  (KLOR-CON  M) 10 MEQ tablet, TAKE 1 TABLET BY MOUTH EVERY MONDAY, WEDNESDAY, AND FRIDAY, Disp: 14 tablet, Rfl: 6   potassium  chloride (KLOR-CON ) 10 MEQ tablet, take 1 tablet by mouth every monday, wednesday, and  friday, Disp: 14 tablet, Rfl: 0   rosuvastatin  (CRESTOR ) 10 MG tablet, Take 1 tablet (10 mg total) by mouth daily., Disp: 90 tablet, Rfl: 1  PHYSICAL EXAM ECOG FS:1 - Symptomatic but completely ambulatory    Vitals:   11/22/23 1408  BP: 119/77  Pulse: 74  Resp: 16  Temp: (!) 97.5 F (36.4 C)  TempSrc: Temporal  SpO2: 100%  Weight: 145 lb 11.2 oz (66.1 kg)  Height: 5' 8 (1.727 m)   Physical Exam Vitals and nursing note reviewed.  Constitutional:      Appearance: She is not ill-appearing or toxic-appearing.  HENT:     Head: Normocephalic.     Mouth/Throat:     Mouth: Mucous membranes are moist.  Eyes:     Conjunctiva/sclera: Conjunctivae normal.  Cardiovascular:     Rate and Rhythm: Normal rate and regular rhythm.     Pulses: Normal pulses.     Heart sounds: Normal heart sounds.  Pulmonary:     Effort: Pulmonary effort is normal.     Breath sounds: Normal breath sounds.  Abdominal:     General: Bowel sounds are normal. There is no distension.     Palpations: Abdomen is soft.     Tenderness: There is no abdominal tenderness. There is no guarding.  Musculoskeletal:     Cervical back: Normal range of motion.  Skin:    General: Skin is warm and dry.  Neurological:     Mental Status: She is alert.        LABORATORY DATA I have reviewed the data as listed    Latest Ref Rng & Units 11/22/2023    1:58 PM 10/10/2023   11:17 AM 07/18/2023   10:58 AM  CBC  WBC 4.0 - 10.5 K/uL 6.3  7.0  6.7   Hemoglobin 12.0 - 15.0 g/dL 88.2  88.9  89.1   Hematocrit 36.0 - 46.0 % 35.1  34.0  32.8   Platelets 150 - 400 K/uL 158  155  147         Latest Ref Rng & Units 11/22/2023    1:58 PM 10/10/2023   11:17 AM 07/18/2023   10:58 AM  CMP  Glucose 70 - 99 mg/dL 800  822  856   BUN 8 - 23 mg/dL 23  21  25    Creatinine 0.44 - 1.00 mg/dL 8.75  8.88  8.72   Sodium 135 - 145 mmol/L 138  137  140    Potassium 3.5 - 5.1 mmol/L 4.8  4.6  5.1   Chloride 98 - 111 mmol/L 100  101  103   CO2 22 - 32 mmol/L 33  32  33   Calcium  8.9 - 10.3 mg/dL 88.9  89.3  89.2   Total Protein 6.5 - 8.1 g/dL 7.8  7.5  7.2   Total Bilirubin 0.0 - 1.2 mg/dL 0.8  0.7  0.8   Alkaline Phos 38 - 126 U/L 75  77  74   AST 15 - 41 U/L 53  41  57   ALT 0 - 44 U/L 27  16  30         RADIOGRAPHIC STUDIES (from last 24 hours if applicable) I have personally reviewed the radiological images as listed and agreed with the findings in the report. No results found.      Visit Diagnosis: 1. Neuroendocrine cancer (HCC)   2. Constipation, unspecified constipation type   3. Nausea and vomiting, unspecified vomiting type  No orders of the defined types were placed in this encounter.   All questions were answered. The patient knows to call the clinic with any problems, questions or concerns. No barriers to learning was detected.  A total of more than 30 minutes were spent on this encounter with face-to-face time and non-face-to-face time, including preparing to see the patient, ordering tests and/or medications, counseling the patient and coordination of care as outlined above.    Thank you for allowing me to participate in the care of this patient.    Solace Wendorff E  Walisiewicz, PA-C Department of Hematology/Oncology [facility] at [facility] Phone: [phone]  [phone]    11/22/2023 4:02 PM

## 2023-11-27 ENCOUNTER — Other Ambulatory Visit: Payer: Self-pay | Admitting: Family Medicine

## 2023-11-28 ENCOUNTER — Ambulatory Visit: Payer: Medicare Other | Attending: Cardiology

## 2023-11-28 DIAGNOSIS — I350 Nonrheumatic aortic (valve) stenosis: Secondary | ICD-10-CM

## 2023-11-29 LAB — ECHOCARDIOGRAM COMPLETE
AR max vel: 0.59 cm2
AV Area VTI: 0.53 cm2
AV Area mean vel: 0.48 cm2
AV Mean grad: 63 mmHg
AV Peak grad: 94.9 mmHg
AV Vena cont: 1 cm
Ao pk vel: 4.87 m/s
Area-P 1/2: 1.24 cm2
Calc EF: 68.9 %
MV M vel: 7.14 m/s
MV Peak grad: 203.9 mmHg
MV VTI: 0.87 cm2
P 1/2 time: 592 ms
S' Lateral: 3.4 cm
Single Plane A2C EF: 73.2 %
Single Plane A4C EF: 67.1 %

## 2023-12-03 ENCOUNTER — Ambulatory Visit: Payer: Self-pay | Admitting: Cardiology

## 2023-12-04 ENCOUNTER — Encounter: Payer: Self-pay | Admitting: Hematology and Oncology

## 2023-12-04 ENCOUNTER — Ambulatory Visit: Attending: Cardiology | Admitting: Cardiology

## 2023-12-04 ENCOUNTER — Encounter: Payer: Self-pay | Admitting: Cardiology

## 2023-12-04 VITALS — BP 100/60 | HR 66 | Ht 68.0 in | Wt 146.2 lb

## 2023-12-04 DIAGNOSIS — R001 Bradycardia, unspecified: Secondary | ICD-10-CM

## 2023-12-04 DIAGNOSIS — I25119 Atherosclerotic heart disease of native coronary artery with unspecified angina pectoris: Secondary | ICD-10-CM

## 2023-12-04 DIAGNOSIS — I342 Nonrheumatic mitral (valve) stenosis: Secondary | ICD-10-CM | POA: Diagnosis not present

## 2023-12-04 DIAGNOSIS — I350 Nonrheumatic aortic (valve) stenosis: Secondary | ICD-10-CM | POA: Diagnosis not present

## 2023-12-04 DIAGNOSIS — C7A8 Other malignant neuroendocrine tumors: Secondary | ICD-10-CM | POA: Diagnosis not present

## 2023-12-04 MED ORDER — LISINOPRIL 10 MG PO TABS: 10.0000 mg | ORAL_TABLET | Freq: Every day | ORAL | 3 refills | Status: AC

## 2023-12-04 NOTE — Patient Instructions (Addendum)
 Medication Instructions:  Your physician has recommended you make the following change in your medication:  Decrease lisinopril  to 10 mg daily Continue all other medications as prescribed  Labwork: none  Testing/Procedures: none  Follow-Up: Your physician recommends that you schedule a follow-up appointment in: 6 months  Any Other Special Instructions Will Be Listed Below (If Applicable).  If you need a refill on your cardiac medications before your next appointment, please call your pharmacy.

## 2023-12-04 NOTE — Progress Notes (Signed)
 Cardiology Office Note  Date: 12/04/2023   ID: Sabrina, Deleon 11-14-1950, MRN [id number]  History of Present Illness: Sabrina Deleon is a 73 y.o. female last seen in February.  She is here for a follow-up visit.  She reports NYHA class II dyspnea with basic activities.  No exertional chest pain.  Has had some lightheadedness when she stands, no palpitations or syncope.  Still undergoing treatment with lanreotide for neuroendocrine tumor.  Medications reviewed.  We discussed reducing her lisinopril  dose given recent low normal blood pressures and intermittent lightheadedness.  Do not want to be too aggressive with blood pressure lowering in the setting of her valvular heart disease.  We discussed the results of her recent echocardiogram.  She still does not want to pursue follow-up with Dr. Lucas for further discussion of surgical management of her valvular heart disease.  We have discussed the progressive nature.  I reviewed her ECG today which shows normal sinus rhythm with LVH/IVCD.  Physical Exam: VS:  BP 100/60   Pulse 66   Ht 5' 8 (1.727 m)   Wt 146 lb 3.2 oz (66.3 kg)   SpO2 98%   BMI 22.23 kg/m , BMI Body mass index is 22.23 kg/m.  Wt Readings from Last 3 Encounters:  12/04/23 146 lb 3.2 oz (66.3 kg)  11/22/23 145 lb 11.2 oz (66.1 kg)  11/16/23 148 lb (67.1 kg)    General: Patient appears comfortable at rest. HEENT: Conjunctiva and lids normal. Neck: Supple, no elevated JVP or carotid bruits. Lungs: Clear to auscultation, nonlabored breathing at rest. Cardiac: Regular rate and rhythm, no S3, 3/6 systolic murmur with absent second heart sound, no pericardial rub. Extremities: No pitting edema.  ECG:  An ECG dated 06/26/2022 was personally reviewed today and demonstrated:  Sinus bradycardia.  Labwork: 11/22/2023: ALT 27; AST 53; BUN 23; Creatinine 1.24; Hemoglobin 11.7; Platelet Count 158; Potassium 4.8; Sodium 138     Component Value Date/Time   CHOL 118  06/08/2023 1135   TRIG 80 06/08/2023 1135   HDL 59 06/08/2023 1135   CHOLHDL 2.0 06/08/2023 1135   LDLCALC 43 06/08/2023 1135   Other Studies Reviewed Today:  Echocardiogram 11/28/2023:  1. Left ventricular ejection fraction, by estimation, is 60 to 65%. The  left ventricle has normal function. The left ventricle has no regional  wall motion abnormalities. There is moderate left ventricular hypertrophy.  Left ventricular diastolic  parameters are indeterminate. Elevated left atrial pressure.   2. Right ventricular systolic function is normal. The right ventricular  size is normal. There is normal pulmonary artery systolic pressure.   3. Left atrial size was severely dilated.   4. The mitral valve is abnormal. Mild mitral valve regurgitation.  Moderate to severe mitral stenosis. The mean mitral valve gradient is 10.0  mmHg.Heart rate 60 bpm Severe mitral annular calcification.   5. The aortic valve has an indeterminant number of cusps. There is severe  calcifcation of the aortic valve. There is severe thickening of the aortic  valve. Aortic valve regurgitation is moderate. Severe aortic valve  stenosis. Aortic valve area, by VTI  measures 0.53 cm. Aortic valve mean gradient measures 63.0 mmHg.   6. The inferior vena cava is normal in size with greater than 50%  respiratory variability, suggesting right atrial pressure of 3 mmHg.   Assessment and Plan:  1.  Severe calcific aortic stenosis with mean gradient 63 mmHg and dimensionless index 0.17 by recent echocardiogram.  LVEF 60 to  65%.  Also moderate aortic regurgitation.  She had previously been evaluated for SAVR by Dr. Lucas, although has been very hesitant to pursue surgery and this remains the case today following discussion.  We have discussed the progressive nature of her valvular heart disease.   2.  Mild, nonobstructive CAD by cardiac catheterization in 2019 during her initial evaluation for aortic stenosis.  No obvious angina  at current level of activity.   3.  Mitral valve disease, recent follow-up echocardiogram shows moderate to severe mitral stenosis with mean MV gradient 10 mmHg in the setting of severe mitral annular calcification.  Mild mitral regurgitation noted.  This has progressed over time and complicates management of her aortic valve disease as well.  She would not be a good candidate for catheter-based intervention.   4.  Asymptomatic carotid artery disease, mild by follow-up carotid Dopplers in September 2024.  5.  History of primary hypertension.  Reducing lisinopril  to 10 mg daily to avoid hypotension in the setting of her valvular heart disease.  6.  Neuroendocrine tumor, continues to follow with oncology on lanreotide.  Disposition:  Follow up 6 months.  Signed, Jayson JUDITHANN Sierras, M.D., F.A.C.C. [facility] at [facility]

## 2023-12-05 ENCOUNTER — Inpatient Hospital Stay

## 2023-12-05 VITALS — BP 125/54 | HR 84 | Temp 97.8°F | Resp 18

## 2023-12-05 DIAGNOSIS — C7A098 Malignant carcinoid tumors of other sites: Secondary | ICD-10-CM | POA: Diagnosis not present

## 2023-12-05 DIAGNOSIS — R112 Nausea with vomiting, unspecified: Secondary | ICD-10-CM | POA: Diagnosis not present

## 2023-12-05 DIAGNOSIS — K5900 Constipation, unspecified: Secondary | ICD-10-CM | POA: Diagnosis not present

## 2023-12-05 DIAGNOSIS — R1010 Upper abdominal pain, unspecified: Secondary | ICD-10-CM | POA: Diagnosis not present

## 2023-12-05 DIAGNOSIS — C7A8 Other malignant neuroendocrine tumors: Secondary | ICD-10-CM

## 2023-12-05 MED ORDER — LANREOTIDE ACETATE 120 MG/0.5ML ~~LOC~~ SOLN
120.0000 mg | Freq: Once | SUBCUTANEOUS | Status: AC
  Administered 2023-12-05: 120 mg via SUBCUTANEOUS
  Filled 2023-12-05: qty 120

## 2023-12-11 ENCOUNTER — Ambulatory Visit: Admitting: Family Medicine

## 2023-12-11 ENCOUNTER — Encounter: Payer: Self-pay | Admitting: Family Medicine

## 2023-12-11 VITALS — BP 118/69 | HR 69 | Ht 68.0 in | Wt 145.0 lb

## 2023-12-11 DIAGNOSIS — H6122 Impacted cerumen, left ear: Secondary | ICD-10-CM

## 2023-12-11 DIAGNOSIS — R490 Dysphonia: Secondary | ICD-10-CM

## 2023-12-11 DIAGNOSIS — E785 Hyperlipidemia, unspecified: Secondary | ICD-10-CM | POA: Diagnosis not present

## 2023-12-11 DIAGNOSIS — Z8739 Personal history of other diseases of the musculoskeletal system and connective tissue: Secondary | ICD-10-CM

## 2023-12-11 DIAGNOSIS — F5101 Primary insomnia: Secondary | ICD-10-CM | POA: Diagnosis not present

## 2023-12-11 DIAGNOSIS — N1832 Chronic kidney disease, stage 3b: Secondary | ICD-10-CM | POA: Diagnosis not present

## 2023-12-11 DIAGNOSIS — Z7984 Long term (current) use of oral hypoglycemic drugs: Secondary | ICD-10-CM

## 2023-12-11 DIAGNOSIS — R634 Abnormal weight loss: Secondary | ICD-10-CM

## 2023-12-11 DIAGNOSIS — D692 Other nonthrombocytopenic purpura: Secondary | ICD-10-CM | POA: Diagnosis not present

## 2023-12-11 DIAGNOSIS — E039 Hypothyroidism, unspecified: Secondary | ICD-10-CM | POA: Diagnosis not present

## 2023-12-11 DIAGNOSIS — E1122 Type 2 diabetes mellitus with diabetic chronic kidney disease: Secondary | ICD-10-CM | POA: Diagnosis not present

## 2023-12-11 NOTE — Progress Notes (Signed)
   Subjective:    Patient ID: Sabrina Deleon, female    DOB: 1950-07-20, 73 y.o.   MRN: [id number]  HPI  6 month follow up  Has questions about Januvia  side effects Type 2 diabetes mellitus with stage 3b chronic kidney disease, without long-term current use of insulin  (HCC) - Plan: Hemoglobin A1c  Hypothyroidism, unspecified type - Plan: TSH + free T4  Hyperlipidemia, unspecified hyperlipidemia type - Plan: Lipid Panel  Primary insomnia  Senile purpura  Impacted cerumen of left ear - Plan: Ambulatory referral to ENT  History of gout - Plan: Uric Acid  Hoarseness - Plan: Ambulatory referral to ENT  Weight loss  Patient relates has difficult time hearing from her left ear despite using hearing aids She still has insomnia issues but when she takes benzodiazepine clonazepam  near bedtime it does help her She is trying to watch her diet and take her cholesterol medicine She also still deals with senile purpura She also relates hoarseness especially as the end of the day comes up She has been through cancer treatments and she does have intermittent epigastric and abdominal pains that radiate to her back but if she lays down for 2030 minutes it seems to get better she does not want any pain medicine at this point She has been dealing with weight loss but trying to take in adequate calories but has been very difficult on her because of her underlying cancer issue She also saw cardiology recently for a valve issue she does get short of breath with activity but ejection fraction looks good she does not want to have surgery on the valves at this point Review of Systems     Objective:   Physical Exam  General-in no acute distress Eyes-no discharge Lungs-respiratory rate normal, CTA CV-no murmurs,RRR Extremities skin warm dry no edema Neuro grossly normal Behavior normal, alert       Assessment & Plan:   1. Type 2 diabetes mellitus with stage 3b chronic kidney disease, without  long-term current use of insulin  (HCC) (Primary) Check A1c continue current approach healthy diet - Hemoglobin A1c  2. Hypothyroidism, unspecified type Continue thyroid  medicine recheck labs await results - TSH + free T4  3. Hyperlipidemia, unspecified hyperlipidemia type Continue medication goal is to have LDL below 70 healthy diet await lab - Lipid Panel  4. Primary insomnia May use clonazepam  at nighttime  5. Senile purpura Mild senile purpura noted on the arms not severe  6. Impacted cerumen of left ear Impaction of the left ear along with difficulty hearing referral to ENT - Ambulatory referral to ENT  7. History of gout History of gout check uric acid level - Uric Acid  8. Hoarseness Hoarseness gets worse as the day goes on has occurred more so since going through chemotherapy had ENT evaluate vocal cords - Ambulatory referral to ENT  9. Weight loss Weight loss secondary to cancer treatments She also has underlying valvular heart disease She is hanging in there the best she can and trying to eat well   She does have a fair amount of abdominal pain but states she does not want any pain medicines currently she states she is able to distract herself okay  Follow-up 6 months sooner if any problems

## 2023-12-12 ENCOUNTER — Ambulatory Visit: Payer: Self-pay | Admitting: Family Medicine

## 2023-12-12 LAB — LIPID PANEL
Chol/HDL Ratio: 2.2 ratio (ref 0.0–4.4)
Cholesterol, Total: 133 mg/dL (ref 100–199)
HDL: 61 mg/dL (ref >39)
LDL Chol Calc (NIH): 55 mg/dL (ref 0–99)
Triglycerides: 88 mg/dL (ref 0–149)
VLDL Cholesterol Cal: 17 mg/dL (ref 5–40)

## 2023-12-12 LAB — HEMOGLOBIN A1C
Est. average glucose Bld gHb Est-mCnc: 163 mg/dL
Hgb A1c MFr Bld: 7.3 % — ABNORMAL HIGH (ref 4.8–5.6)

## 2023-12-12 LAB — TSH+FREE T4
Free T4: 1.74 ng/dL (ref 0.82–1.77)
TSH: 0.276 u[IU]/mL — ABNORMAL LOW (ref 0.450–4.500)

## 2023-12-12 LAB — URIC ACID: Uric Acid: 5.1 mg/dL (ref 3.1–7.9)

## 2023-12-27 ENCOUNTER — Other Ambulatory Visit: Payer: Self-pay | Admitting: Family Medicine

## 2024-01-02 ENCOUNTER — Inpatient Hospital Stay: Admitting: Hematology and Oncology

## 2024-01-02 ENCOUNTER — Inpatient Hospital Stay

## 2024-01-02 ENCOUNTER — Encounter (INDEPENDENT_AMBULATORY_CARE_PROVIDER_SITE_OTHER): Payer: Self-pay | Admitting: Gastroenterology

## 2024-01-08 ENCOUNTER — Other Ambulatory Visit: Payer: Self-pay | Admitting: Physician Assistant

## 2024-01-08 DIAGNOSIS — C7A8 Other malignant neuroendocrine tumors: Secondary | ICD-10-CM

## 2024-01-09 ENCOUNTER — Inpatient Hospital Stay: Admitting: Physician Assistant

## 2024-01-09 ENCOUNTER — Inpatient Hospital Stay: Attending: Physician Assistant

## 2024-01-09 ENCOUNTER — Inpatient Hospital Stay

## 2024-01-09 VITALS — BP 138/77 | HR 53 | Temp 97.3°F | Resp 16 | Ht 68.0 in | Wt 143.0 lb

## 2024-01-09 DIAGNOSIS — R7989 Other specified abnormal findings of blood chemistry: Secondary | ICD-10-CM | POA: Diagnosis not present

## 2024-01-09 DIAGNOSIS — N189 Chronic kidney disease, unspecified: Secondary | ICD-10-CM | POA: Insufficient documentation

## 2024-01-09 DIAGNOSIS — C7A8 Other malignant neuroendocrine tumors: Secondary | ICD-10-CM

## 2024-01-09 DIAGNOSIS — B369 Superficial mycosis, unspecified: Secondary | ICD-10-CM | POA: Insufficient documentation

## 2024-01-09 DIAGNOSIS — C7A098 Malignant carcinoid tumors of other sites: Secondary | ICD-10-CM | POA: Diagnosis present

## 2024-01-09 DIAGNOSIS — D631 Anemia in chronic kidney disease: Secondary | ICD-10-CM | POA: Diagnosis not present

## 2024-01-09 DIAGNOSIS — B356 Tinea cruris: Secondary | ICD-10-CM

## 2024-01-09 LAB — CBC WITH DIFFERENTIAL (CANCER CENTER ONLY)
Abs Immature Granulocytes: 0.03 K/uL (ref 0.00–0.07)
Basophils Absolute: 0 K/uL (ref 0.0–0.1)
Basophils Relative: 1 %
Eosinophils Absolute: 0.2 K/uL (ref 0.0–0.5)
Eosinophils Relative: 2 %
HCT: 32.4 % — ABNORMAL LOW (ref 36.0–46.0)
Hemoglobin: 10.8 g/dL — ABNORMAL LOW (ref 12.0–15.0)
Immature Granulocytes: 0 %
Lymphocytes Relative: 16 %
Lymphs Abs: 1.1 K/uL (ref 0.7–4.0)
MCH: 31 pg (ref 26.0–34.0)
MCHC: 33.3 g/dL (ref 30.0–36.0)
MCV: 93.1 fL (ref 80.0–100.0)
Monocytes Absolute: 0.5 K/uL (ref 0.1–1.0)
Monocytes Relative: 7 %
Neutro Abs: 5.1 K/uL (ref 1.7–7.7)
Neutrophils Relative %: 74 %
Platelet Count: 139 K/uL — ABNORMAL LOW (ref 150–400)
RBC: 3.48 MIL/uL — ABNORMAL LOW (ref 3.87–5.11)
RDW: 13.7 % (ref 11.5–15.5)
WBC Count: 6.9 K/uL (ref 4.0–10.5)
nRBC: 0 % (ref 0.0–0.2)

## 2024-01-09 LAB — CMP (CANCER CENTER ONLY)
ALT: 15 U/L (ref 0–44)
AST: 38 U/L (ref 15–41)
Albumin: 4.1 g/dL (ref 3.5–5.0)
Alkaline Phosphatase: 70 U/L (ref 38–126)
Anion gap: 5 (ref 5–15)
BUN: 20 mg/dL (ref 8–23)
CO2: 32 mmol/L (ref 22–32)
Calcium: 10.9 mg/dL — ABNORMAL HIGH (ref 8.9–10.3)
Chloride: 99 mmol/L (ref 98–111)
Creatinine: 1.22 mg/dL — ABNORMAL HIGH (ref 0.44–1.00)
GFR, Estimated: 47 mL/min — ABNORMAL LOW (ref >60)
Glucose, Bld: 164 mg/dL — ABNORMAL HIGH (ref 70–99)
Potassium: 4.5 mmol/L (ref 3.5–5.1)
Sodium: 136 mmol/L (ref 135–145)
Total Bilirubin: 0.8 mg/dL (ref 0.0–1.2)
Total Protein: 7.3 g/dL (ref 6.5–8.1)

## 2024-01-09 MED ORDER — FLUCONAZOLE 150 MG PO TABS: 150.0000 mg | ORAL_TABLET | Freq: Once | ORAL | 0 refills | Status: AC

## 2024-01-09 MED ORDER — LANREOTIDE ACETATE 120 MG/0.5ML ~~LOC~~ SOLN
120.0000 mg | Freq: Once | SUBCUTANEOUS | Status: AC
  Administered 2024-01-09: 120 mg via SUBCUTANEOUS
  Filled 2024-01-09: qty 120

## 2024-01-09 NOTE — Progress Notes (Unsigned)
 [facility] [phone]   [phone]  PROGRESS NOTE  Patient Care Team: Alphonsa Glendia LABOR, MD as PCP - General (Family Medicine) Debera Jayson MATSU, MD as PCP - Cardiology (Cardiology)  Hematological/Oncological History # Well Differentiated Neuroendocrine Tumor, GI origin. Grade 2.  11/29/2021: Patient underwent CT angiogram C/A/P prior to consideration of TAVR for severe aortic stenosis, noted to have bulky central mesenteric soft tissue mass that has increased in 2019.  Additionally there was new widespread mild to moderate retroperitoneal, retrocrural, mediastinal and bilateral hilar lymphadenopathy 12/15/2021: NM PET CT Scan showed hypermetabolic mesenteric mass with hypermetabolic adenopathy in the small bowel mesentery, abdominal retroperitoneum and retrocrural stations. 12/28/2021: IR guided biopsy of the mesenteric mass was consistent with well differentiated neuroendocrine tumor 01/10/2022: start of lanreotide 120mg  IM q 28 days.   Interval History:  Sabrina Deleon 73 y.o. female with medical history significant for newly diagnosed neuroendocrine tumor who presents for a follow up visit. The patient's last visit was on 10/10/2023. In the interim since the last visit she continued lanreotide treatment.  On exam today Sabrina Deleon reports her energy and appetite are fairly unchanged since last visit.  She does have fatigue but can complete her daily activities on her own.  She reports having postprandial abdominal pain in the mid abdomen rating 5 out of 10 on a pain scale.  She reports the pain improves with lying down and is present when she is constipated.  She denies easy bruising or signs of active bleeding.  Patient reports a rash near her tailbone that goes around her rectum.  She reports the rashes itchy, erythematous without any discharge.  She otherwise denies any fevers, chills, sweats, nausea, vomiting, flushing or palpitations.  She has no other  complaints.  A full 10 point ROS is otherwise negative.  MEDICAL HISTORY:  Past Medical History:  Diagnosis Date   Anxiety    Aortic stenosis    Arthritis    Chronic kidney disease    Dr. Alphonsa LEOS- noted increase kidney function levels- took pt off Metformin  and started on Onglyza for Blood sugar control-pt being followed for this.   GERD (gastroesophageal reflux disease)    Gout    Hypertension    Hypothyroidism    Morbid obesity (HCC) 03/29/2018   Patient has elevated BMI with diabetes hyperlipidemia and hypertension   Pancreatitis    Type 2 diabetes mellitus (HCC)    Wears glasses    Wears partial dentures    Upper    SURGICAL HISTORY: Past Surgical History:  Procedure Laterality Date   BACK SURGERY     Lumbar   CARPAL TUNNEL RELEASE  11/02/2011   Procedure: CARPAL TUNNEL RELEASE;  Surgeon: Franky JONELLE Curia, MD;  Location: [facility];  Service: Orthopedics;  Laterality: Right;   CARPAL TUNNEL RELEASE Left 01/14/2015   Procedure: LEFT CARPAL TUNNEL RELEASE;  Surgeon: Franky Curia, MD;  Location: [facility];  Service: Orthopedics;  Laterality: Left;   CHOLECYSTECTOMY     laparoscopic   COLONOSCOPY     COLONOSCOPY N/A 11/12/2015   Procedure: COLONOSCOPY;  Surgeon: Claudis RAYMOND Rivet, MD;  Location: [facility];  Service: Endoscopy;  Laterality: N/A;  9:30   ESOPHAGOGASTRODUODENOSCOPY N/A 11/12/2015   Procedure: ESOPHAGOGASTRODUODENOSCOPY (EGD);  Surgeon: Claudis RAYMOND Rivet, MD;  Location: [facility];  Service: Endoscopy;  Laterality: N/A;   REVERSE SHOULDER ARTHROPLASTY Right 11/04/2018   REVERSE SHOULDER ARTHROPLASTY Right 11/04/2018   Procedure: REVERSE SHOULDER  ARTHROPLASTY;  Surgeon: Carolee Lynwood JINNY DOUGLAS, MD;  Location[facility];  Service: Orthopedics;  Laterality: Right;   RIGHT/LEFT HEART CATH AND CORONARY ANGIOGRAPHY N/A 10/18/2017   Procedure: RIGHT/LEFT HEART CATH AND CORONARY ANGIOGRAPHY;  Surgeon: Verlin Lonni BIRCH, MD;  Location: [facility];  Service: Cardiovascular;  Laterality: N/A;   TOTAL HIP ARTHROPLASTY Left 03/22/2016   Procedure: LEFT TOTAL HIP ARTHROPLASTY ANTERIOR APPROACH;  Surgeon: Dempsey Moan, MD;  Location: [facility];  Service: Orthopedics;  Laterality: Left;    SOCIAL HISTORY: Social History   Socioeconomic History   Marital status: Married    Spouse name: Not on file   Number of children: Not on file   Years of education: Not on file   Highest education level: Some college, no degree  Occupational History   Not on file  Tobacco Use   Smoking status: Former    Current packs/day: 0.00    Types: Cigarettes    Quit date: 10/29/1981    Years since quitting: 42.2   Smokeless tobacco: Never  Vaping Use   Vaping status: Never Used  Substance and Sexual Activity   Alcohol use: Yes    Comment: occasionally   Drug use: No   Sexual activity: Not Currently  Other Topics Concern   Not on file  Social History Narrative   Not on file   Social Drivers of Health   Financial Resource Strain: Low Risk  (01/08/2023)   Overall Financial Resource Strain (CARDIA)    Difficulty of Paying Living Expenses: Not hard at all  Food Insecurity: No Food Insecurity (01/08/2023)   Hunger Vital Sign    Worried About Running Out of Food in the Last Year: Never true    Ran Out of Food in the Last Year: Never true  Transportation Needs: No Transportation Needs (01/08/2023)   PRAPARE - Administrator, Civil Service (Medical): No    Lack of Transportation (Non-Medical): No  Physical Activity: Unknown (01/08/2023)   Exercise Vital Sign    Days of Exercise per Week: 0 days    Minutes of Exercise per Session: Not on file  Recent Concern: Physical Activity - Inactive (01/08/2023)   Exercise Vital Sign    Days of Exercise per Week: 0 days    Minutes of Exercise per Session: 20 min  Stress: No Stress Concern Present (01/08/2023)   [employer] of Occupational Health - Occupational Stress  Questionnaire    Feeling of Stress : Only a little  Social Connections: Socially Integrated (01/08/2023)   Social Connection and Isolation Panel    Frequency of Communication with Friends and Family: More than three times a week    Frequency of Social Gatherings with Friends and Family: More than three times a week    Attends Religious Services: More than 4 times per year    Active Member of [employer] or Organizations: Yes    Attends Engineer, structural: More than 4 times per year    Marital Status: Married  Catering manager Violence: Not on file    FAMILY HISTORY: Family History  Problem Relation Age of Onset   Breast cancer Mother    Lung cancer Mother        likely recurrent breast cancer   Breast cancer Paternal Grandmother    Head & neck cancer Nephew     ALLERGIES:  is allergic to bee venom.  MEDICATIONS:  Current Outpatient Medications  Medication Sig Dispense Refill   allopurinol  (ZYLOPRIM ) 100  MG tablet Take 1 tablet (100 mg total) by mouth 2 (two) times daily. 180 tablet 1   Cholecalciferol (VITAMIN D) 50 MCG (2000 UT) tablet Take 2,000 Units by mouth daily.     clobetasol  cream (TEMOVATE ) 0.05 % Apply 1 Application topically 2 (two) times daily. 30 g 0   clonazePAM  (KLONOPIN ) 0.5 MG tablet take 1 tablet at bedtime as needed for insomnia 30 tablet 0   Colchicine  0.6 MG CAPS Take one cap po BID prn gout flare up 30 capsule 0   DULoxetine  (CYMBALTA ) 30 MG capsule TAKE 1 CAPSULE ONCE DAILY 90 capsule 2   ferrous sulfate  325 (65 FE) MG tablet Take 325 mg by mouth daily with breakfast.     furosemide  (LASIX ) 40 MG tablet TAKE 1 TABLET(40 MG) BY MOUTH DAILY 90 tablet 1   glucose blood (ONETOUCH ULTRA) test strip 1 each by Other route as needed for other. for testing 50 strip 5   ipratropium (ATROVENT ) 0.03 % nasal spray Place 2 sprays into both nostrils every 12 (twelve) hours. 30 mL 12   JANUVIA  50 MG tablet TAKE 1 TABLET(50 MG) BY MOUTH DAILY 30 tablet 5    ketoconazole  (NIZORAL ) 2 % cream Apply 1 Application topically daily as needed (fungus). 60 g 0   levothyroxine  (SYNTHROID ) 125 MCG tablet Take 1 tablet (125 mcg total) by mouth daily before breakfast. 90 tablet 1   lisinopril  (ZESTRIL ) 10 MG tablet Take 1 tablet (10 mg total) by mouth daily. 90 tablet 3   MISC NATURAL PRODUCTS PO Take 1 tablet by mouth daily. Super Beets otc supplement     ondansetron  (ZOFRAN ) 8 MG tablet Take 1 tablet (8 mg total) by mouth every 8 (eight) hours as needed for nausea or vomiting. 30 tablet 1   pantoprazole  (PROTONIX ) 40 MG tablet TAKE 1 TABLET(40 MG) BY MOUTH DAILY 90 tablet 1   potassium chloride  (KLOR-CON  M) 10 MEQ tablet TAKE 1 TABLET BY MOUTH EVERY MONDAY, WEDNESDAY, AND FRIDAY 14 tablet 6   rosuvastatin  (CRESTOR ) 10 MG tablet Take 1 tablet (10 mg total) by mouth daily. 90 tablet 1   No current facility-administered medications for this visit.    REVIEW OF SYSTEMS:   All other systems were reviewed with the patient and are negative.  PHYSICAL EXAMINATION: ECOG PERFORMANCE STATUS: 1 - Symptomatic but completely ambulatory  Vitals:   01/09/24 1329  BP: 138/77  Pulse: (!) 53  Resp: 16  Temp: (!) 97.3 F (36.3 C)  SpO2: 100%     Filed Weights   01/09/24 1329  Weight: 143 lb (64.9 kg)    GENERAL: Well-appearing elderly Caucasian female, alert, no distress and comfortable SKIN: skin color, texture, turgor are normal, no rashes or significant lesions.  Erythematous rash noted around her rectal folds consistent with fungal process. EYES: conjunctiva are pink and non-injected, sclera clear LUNGS: clear to auscultation and percussion with normal breathing effort HEART: regular rate & rhythm and no murmurs and no lower extremity edema Musculoskeletal: no cyanosis of digits and no clubbing  PSYCH: alert & oriented x 3, fluent speech NEURO: no focal motor/sensory deficits  LABORATORY DATA:  I have reviewed the data as listed    Latest Ref Rng &  Units 01/09/2024    1:10 PM 11/22/2023    1:58 PM 10/10/2023   11:17 AM  CBC  WBC 4.0 - 10.5 K/uL 6.9  6.3  7.0   Hemoglobin 12.0 - 15.0 g/dL 89.1  88.2  88.9   Hematocrit  36.0 - 46.0 % 32.4  35.1  34.0   Platelets 150 - 400 K/uL 139  158  155        Latest Ref Rng & Units 11/22/2023    1:58 PM 10/10/2023   11:17 AM 07/18/2023   10:58 AM  CMP  Glucose 70 - 99 mg/dL 800  822  856   BUN 8 - 23 mg/dL 23  21  25    Creatinine 0.44 - 1.00 mg/dL 8.75  8.88  8.72   Sodium 135 - 145 mmol/L 138  137  140   Potassium 3.5 - 5.1 mmol/L 4.8  4.6  5.1   Chloride 98 - 111 mmol/L 100  101  103   CO2 22 - 32 mmol/L 33  32  33   Calcium  8.9 - 10.3 mg/dL 88.9  89.3  89.2   Total Protein 6.5 - 8.1 g/dL 7.8  7.5  7.2   Total Bilirubin 0.0 - 1.2 mg/dL 0.8  0.7  0.8   Alkaline Phos 38 - 126 U/L 75  77  74   AST 15 - 41 U/L 53  41  57   ALT 0 - 44 U/L 27  16  30     RADIOGRAPHIC STUDIES: No results found.  ASSESSMENT & PLAN Sabrina Deleon is a 73 y.o. y.o. female with medical history significant for newly diagnosed neuroendocrine tumor who presents for a follow up visit.  #Metastatic Well Differentiated Neuroendocrine Tumor of the GI Tract -- CT imaging and PET CT scan confirmed widespread lymphadenopathy consistent with metastatic spread of well-differentiated neuroendocrine tumor. -- Biopsy confirms this is a well differentiated neuroendocrine tumor of the GI tract. -- Today we will proceed with lanreotide 120 mg IM q. 28 days. --labs today show WBC 6.9, hemoglobin 10.8, platelet 139, creatinine stable at 1.22, LFTs normal. --Last CT CAP from 10/24/2023 shows stable disease without any evidence of progression.  Next CT scan will be due in February 2026. -- Have patient return to clinic in 12 weeks time with interval monthly shots.   # Diarrhea versus Constipation: -- Advised patient to take supportive medications including Imodium when she has diarrhea and stool softeners for constipation.    #  Elevated LFTs --occurring periodically.  --continue to monitor   #Fungal rash: -- Sent prescription for oral Diflucan  and advised to apply over-the-counter antifungal cream to affected area.  #CKD #normocytic anemia: --Overall creatinine and Hgb levels are stable.  --Monitor for now.    No orders of the defined types were placed in this encounter.   All questions were answered. The patient knows to call the clinic with any problems, questions or concerns.  I have spent a total of 30 minutes minutes of face-to-face and non-face-to-face time, preparing to see the patient, performing a medically appropriate examination, counseling and educating the patient, documenting clinical information in the electronic health record, and care coordination.   Johnston Police PA-C Dept of Hematology and Oncology [facility] Phone: [phone]   01/09/2024 1:46 PM

## 2024-01-11 ENCOUNTER — Encounter: Payer: Self-pay | Admitting: Hematology and Oncology

## 2024-01-11 LAB — CHROMOGRANIN A: Chromogranin A (ng/mL): 208.1 ng/mL — ABNORMAL HIGH (ref 0.0–101.8)

## 2024-01-21 ENCOUNTER — Other Ambulatory Visit: Payer: Self-pay

## 2024-01-21 ENCOUNTER — Telehealth: Payer: Self-pay | Admitting: *Deleted

## 2024-01-21 DIAGNOSIS — R112 Nausea with vomiting, unspecified: Secondary | ICD-10-CM

## 2024-01-21 NOTE — Progress Notes (Signed)
 Orders placed for lab appt on 01/23/2024 per Mallie ORN PA-C.

## 2024-01-21 NOTE — Telephone Encounter (Signed)
 TCT patient after receiving vm message requesting a call back. Spoke with her. She states that she is getting progressively SOB with moderate activity, has mid abdominal pain, nausea, vomiting bile and is very fatigued. This has been going on for the last 2 weeks. She is not sure what is going on. Questions if it is cardiac. Advised that we could see her in [facility] to help figure out what is causing her current symptoms. She is agreeable to being seen in [facility] Spoke with Kaitlyn  W. In [facility]  she can see her 01/23/24 @ 10 am. Pt made aware of appts.

## 2024-01-22 ENCOUNTER — Other Ambulatory Visit: Payer: Self-pay | Admitting: Family Medicine

## 2024-01-22 ENCOUNTER — Other Ambulatory Visit: Payer: Self-pay | Admitting: Hematology and Oncology

## 2024-01-23 ENCOUNTER — Inpatient Hospital Stay: Attending: Physician Assistant

## 2024-01-23 ENCOUNTER — Inpatient Hospital Stay (HOSPITAL_BASED_OUTPATIENT_CLINIC_OR_DEPARTMENT_OTHER): Admitting: Physician Assistant

## 2024-01-23 VITALS — BP 92/76 | HR 70 | Temp 97.9°F | Resp 16 | Wt 137.6 lb

## 2024-01-23 DIAGNOSIS — C7A098 Malignant carcinoid tumors of other sites: Secondary | ICD-10-CM | POA: Diagnosis present

## 2024-01-23 DIAGNOSIS — R1084 Generalized abdominal pain: Secondary | ICD-10-CM | POA: Diagnosis not present

## 2024-01-23 DIAGNOSIS — K219 Gastro-esophageal reflux disease without esophagitis: Secondary | ICD-10-CM | POA: Insufficient documentation

## 2024-01-23 DIAGNOSIS — G8929 Other chronic pain: Secondary | ICD-10-CM | POA: Diagnosis not present

## 2024-01-23 DIAGNOSIS — C7A8 Other malignant neuroendocrine tumors: Secondary | ICD-10-CM | POA: Diagnosis not present

## 2024-01-23 DIAGNOSIS — I080 Rheumatic disorders of both mitral and aortic valves: Secondary | ICD-10-CM | POA: Insufficient documentation

## 2024-01-23 DIAGNOSIS — R112 Nausea with vomiting, unspecified: Secondary | ICD-10-CM | POA: Diagnosis not present

## 2024-01-23 DIAGNOSIS — R0602 Shortness of breath: Secondary | ICD-10-CM | POA: Insufficient documentation

## 2024-01-23 DIAGNOSIS — R109 Unspecified abdominal pain: Secondary | ICD-10-CM | POA: Diagnosis not present

## 2024-01-23 LAB — CBC WITH DIFFERENTIAL (CANCER CENTER ONLY)
Abs Immature Granulocytes: 0.01 K/uL (ref 0.00–0.07)
Basophils Absolute: 0 K/uL (ref 0.0–0.1)
Basophils Relative: 1 %
Eosinophils Absolute: 0.2 K/uL (ref 0.0–0.5)
Eosinophils Relative: 3 %
HCT: 34.6 % — ABNORMAL LOW (ref 36.0–46.0)
Hemoglobin: 11.6 g/dL — ABNORMAL LOW (ref 12.0–15.0)
Immature Granulocytes: 0 %
Lymphocytes Relative: 21 %
Lymphs Abs: 1.2 K/uL (ref 0.7–4.0)
MCH: 31.1 pg (ref 26.0–34.0)
MCHC: 33.5 g/dL (ref 30.0–36.0)
MCV: 92.8 fL (ref 80.0–100.0)
Monocytes Absolute: 0.4 K/uL (ref 0.1–1.0)
Monocytes Relative: 7 %
Neutro Abs: 4 K/uL (ref 1.7–7.7)
Neutrophils Relative %: 68 %
Platelet Count: 193 K/uL (ref 150–400)
RBC: 3.73 MIL/uL — ABNORMAL LOW (ref 3.87–5.11)
RDW: 13.5 % (ref 11.5–15.5)
WBC Count: 5.9 K/uL (ref 4.0–10.5)
nRBC: 0 % (ref 0.0–0.2)

## 2024-01-23 LAB — CMP (CANCER CENTER ONLY)
ALT: 14 U/L (ref 0–44)
AST: 43 U/L — ABNORMAL HIGH (ref 15–41)
Albumin: 4.4 g/dL (ref 3.5–5.0)
Alkaline Phosphatase: 78 U/L (ref 38–126)
Anion gap: 5 (ref 5–15)
BUN: 22 mg/dL (ref 8–23)
CO2: 32 mmol/L (ref 22–32)
Calcium: 10.8 mg/dL — ABNORMAL HIGH (ref 8.9–10.3)
Chloride: 100 mmol/L (ref 98–111)
Creatinine: 1.32 mg/dL — ABNORMAL HIGH (ref 0.44–1.00)
GFR, Estimated: 43 mL/min — ABNORMAL LOW (ref >60)
Glucose, Bld: 168 mg/dL — ABNORMAL HIGH (ref 70–99)
Potassium: 4.8 mmol/L (ref 3.5–5.1)
Sodium: 137 mmol/L (ref 135–145)
Total Bilirubin: 0.7 mg/dL (ref 0.0–1.2)
Total Protein: 7.7 g/dL (ref 6.5–8.1)

## 2024-01-23 LAB — SAMPLE TO BLOOD BANK

## 2024-01-23 LAB — MAGNESIUM: Magnesium: 1.6 mg/dL — ABNORMAL LOW (ref 1.7–2.4)

## 2024-01-23 NOTE — Progress Notes (Signed)
 Symptom Management Consult Note [facility]    Patient Care Team: Alphonsa Glendia LABOR, MD as PCP - General (Family Medicine) Debera Jayson MATSU, MD as PCP - Cardiology (Cardiology)    Name / MRN / DOB: Sabrina Deleon  [id number]  [birth year]   Date of visit: 01/23/2024   Chief Complaint/Reason for visit: abdominal pain and shortness of breath   Current Therapy: Lanreotide   Last treatment:  Treatment 26 on 01/09/24    ASSESSMENT AND PLAN Patient is a 73 y.o. female with oncologic history of well differentiated neuroendocrine tumor, GI origin, grade 2 followed by Dr. Federico.  I have viewed most recent oncology note and lab work.  #Well Differentiated Neuroendocrine Tumor, GI origin. Grade 2  - Current treatment is Lanreotide injections q28 days that was started 01/10/22. - Next appointment with oncologist is 04/03/23   #Symptom management: abdominal pain and dyspnea - AE of Lanreotide include: abdominal pain, nausea, vomiting, diarrhea. Patient has zofran  at home.  -Acute on chronic abdominal pain with nausea and weight loss, differential includes GERD, pancreatic issues, cancer progression, AE of Lanreotide.  - CBC shows stable anemia. CMP shows no significant electrolyte derangement. Creatinine and AST consistent with previous labs. - Ordered CT scan of chest, abdomen, and pelvis for further evaluation of pain. - Recommended Pepcid on days not taking Protonix . Consider GI referral if CT scan shows no oncology related issues. - Patient not tachycardic or hypoxic. No associated chest pain. Cardiac exam reveals regular rate and rhythm, 3/6 systolic murmur, no pericardial rub. Similar to PE at cardiology visit. Consider cardiologist follow-up if CT scan unremarkable.  #Cerumen impaction, left ear - Cerumen impaction causing hearing difficulties, previous attempts to clear unsuccessful. - Recommended Debrox drops to soften earwax. - Advised follow-up if PCP hearing does  not improve.  Per chart review patient saw PCP on 12/11/23 and note includes that she has been experiencing intermittent epigastric and abdominal pains that radiate to her back. She also saw cardiology 12/04/23 for her mitral valve disease. Echo on 11/28/23 showing moderate to severe mitral stenosis with mean MV gradient 10 mmHg in the setting of severe mitral annular calcification. Per cardiologist: She would not be a good candidate for catheter-based intervention.     Strict ED precautions discussed should symptoms worsen.   HEME/ONC HISTORY Oncology History   No history exists.      INTERVAL HISTORY  Discussed the use of AI scribe software for clinical note transcription with the patient, who gave verbal consent to proceed.    Sabrina Deleon is a 73 y.o. female with oncologic history of well differentiated neuroendocrine tumor, GI origin, grade 2 presenting to [facility] today with chief complaint of abdominal pain and shortness of breath. Accompanied to clinic today by spouse who provides additional history.  She has been experiencing worsening shortness of breath over the past few weeks, enough to require her to lie down. Lying flat alleviates the shortness of breath. She has a history of mitral valve disease, and recalls that her cardiologist discussed that surgery was not a viable option and that her condition would be monitored. No cough or wheezing is reported, denies fever, chills, congestion, chest pain or leg swelling.  She describes persistent abdominal pain for the last three weeks, which is constant but worsens after eating. The pain occasionally presents as sharp and is located around her abdomen, radiating to her right upper abdomen. Lying down provides some relief. She has a history  of GERD and takes Protonix  every other day, but she does not notice a significant difference in pain on the days she takes it. Her diet is mostly bland foods, avoiding spicy foods, and she consumes a lot  of chicken. She has a history of gallstones and had her gallbladder removed several years ago.  She experiences nausea frequently, particularly in the mornings, and takes Zofran  before getting up, which helps alleviate the nausea. However, she sometimes feels nauseated after eating. She also reports chronic diarrhea, which she attributes to her gallbladder removal and possibly her current medication regimen, including Lantreotide injections started in October 2023 and Januvia  for blood sugar control. She has lost approximately six pounds since her last visit, which she attributes to decreased appetite and feeling unwell.     ROS  All other systems are reviewed and are negative for acute change except as noted in the HPI.    Allergies  Allergen Reactions   Bee Venom Other (See Comments)     Past Medical History:  Diagnosis Date   Anxiety    Aortic stenosis    Arthritis    Chronic kidney disease    Dr. Alphonsa LEOS- noted increase kidney function levels- took pt off Metformin  and started on Onglyza for Blood sugar control-pt being followed for this.   GERD (gastroesophageal reflux disease)    Gout    Hypertension    Hypothyroidism    Morbid obesity (HCC) 03/29/2018   Patient has elevated BMI with diabetes hyperlipidemia and hypertension   Pancreatitis    Type 2 diabetes mellitus (HCC)    Wears glasses    Wears partial dentures    Upper     Past Surgical History:  Procedure Laterality Date   BACK SURGERY     Lumbar   CARPAL TUNNEL RELEASE  11/02/2011   Procedure: CARPAL TUNNEL RELEASE;  Surgeon: Franky JONELLE Curia, MD;  Location: [facility];  Service: Orthopedics;  Laterality: Right;   CARPAL TUNNEL RELEASE Left 01/14/2015   Procedure: LEFT CARPAL TUNNEL RELEASE;  Surgeon: Franky Curia, MD;  Location: [facility];  Service: Orthopedics;  Laterality: Left;   CHOLECYSTECTOMY     laparoscopic   COLONOSCOPY     COLONOSCOPY N/A 11/12/2015   Procedure:  COLONOSCOPY;  Surgeon: Claudis RAYMOND Rivet, MD;  Location: [facility];  Service: Endoscopy;  Laterality: N/A;  9:30   ESOPHAGOGASTRODUODENOSCOPY N/A 11/12/2015   Procedure: ESOPHAGOGASTRODUODENOSCOPY (EGD);  Surgeon: Claudis RAYMOND Rivet, MD;  Location: [facility];  Service: Endoscopy;  Laterality: N/A;   REVERSE SHOULDER ARTHROPLASTY Right 11/04/2018   REVERSE SHOULDER ARTHROPLASTY Right 11/04/2018   Procedure: REVERSE SHOULDER ARTHROPLASTY;  Surgeon: Carolee Lynwood JINNY DOUGLAS, MD;  Location: [facility];  Service: Orthopedics;  Laterality: Right;   RIGHT/LEFT HEART CATH AND CORONARY ANGIOGRAPHY N/A 10/18/2017   Procedure: RIGHT/LEFT HEART CATH AND CORONARY ANGIOGRAPHY;  Surgeon: Verlin Lonni BIRCH, MD;  Location: [facility];  Service: Cardiovascular;  Laterality: N/A;   TOTAL HIP ARTHROPLASTY Left 03/22/2016   Procedure: LEFT TOTAL HIP ARTHROPLASTY ANTERIOR APPROACH;  Surgeon: Dempsey Moan, MD;  Location: [facility];  Service: Orthopedics;  Laterality: Left;    Social History   Socioeconomic History   Marital status: Married    Spouse name: Not on file   Number of children: Not on file   Years of education: Not on file   Highest education level: Some college, no degree  Occupational History   Not on file  Tobacco Use  Smoking status: Former    Current packs/day: 0.00    Types: Cigarettes    Quit date: 10/29/1981    Years since quitting: 42.2   Smokeless tobacco: Never  Vaping Use   Vaping status: Never Used  Substance and Sexual Activity   Alcohol use: Yes    Comment: occasionally   Drug use: No   Sexual activity: Not Currently  Other Topics Concern   Not on file  Social History Narrative   Not on file   Social Drivers of Health   Financial Resource Strain: Low Risk  (01/08/2023)   Overall Financial Resource Strain (CARDIA)    Difficulty of Paying Living Expenses: Not hard at all  Food Insecurity: No Food Insecurity (01/08/2023)   Hunger Vital Sign    Worried About Running  Out of Food in the Last Year: Never true    Ran Out of Food in the Last Year: Never true  Transportation Needs: No Transportation Needs (01/08/2023)   PRAPARE - Administrator, Civil Service (Medical): No    Lack of Transportation (Non-Medical): No  Physical Activity: Unknown (01/08/2023)   Exercise Vital Sign    Days of Exercise per Week: 0 days    Minutes of Exercise per Session: Not on file  Recent Concern: Physical Activity - Inactive (01/08/2023)   Exercise Vital Sign    Days of Exercise per Week: 0 days    Minutes of Exercise per Session: 20 min  Stress: No Stress Concern Present (01/08/2023)   [employer] of Occupational Health - Occupational Stress Questionnaire    Feeling of Stress : Only a little  Social Connections: Socially Integrated (01/08/2023)   Social Connection and Isolation Panel    Frequency of Communication with Friends and Family: More than three times a week    Frequency of Social Gatherings with Friends and Family: More than three times a week    Attends Religious Services: More than 4 times per year    Active Member of [employer] or Organizations: Yes    Attends Engineer, Structural: More than 4 times per year    Marital Status: Married  Catering Manager Violence: Not on file    Family History  Problem Relation Age of Onset   Breast cancer Mother    Lung cancer Mother        likely recurrent breast cancer   Breast cancer Paternal Grandmother    Head & neck cancer Nephew      Current Outpatient Medications:    allopurinol  (ZYLOPRIM ) 100 MG tablet, Take 1 tablet (100 mg total) by mouth 2 (two) times daily., Disp: 180 tablet, Rfl: 1   Cholecalciferol (VITAMIN D) 50 MCG (2000 UT) tablet, Take 2,000 Units by mouth daily., Disp: , Rfl:    clobetasol  cream (TEMOVATE ) 0.05 %, Apply 1 Application topically 2 (two) times daily., Disp: 30 g, Rfl: 0   clonazePAM  (KLONOPIN ) 0.5 MG tablet, take 1 tablet at bedtime as needed for insomnia, Disp:  30 tablet, Rfl: 0   Colchicine  0.6 MG CAPS, Take one cap po BID prn gout flare up, Disp: 30 capsule, Rfl: 0   DULoxetine  (CYMBALTA ) 30 MG capsule, TAKE 1 CAPSULE ONCE DAILY, Disp: 90 capsule, Rfl: 2   ferrous sulfate  325 (65 FE) MG tablet, Take 325 mg by mouth daily with breakfast., Disp: , Rfl:    furosemide  (LASIX ) 40 MG tablet, take 1 tablet(40 mg) by mouth daily, Disp: 90 tablet, Rfl: 0   glucose blood (ONETOUCH ULTRA)  test strip, 1 each by Other route as needed for other. for testing, Disp: 50 strip, Rfl: 5   ipratropium (ATROVENT ) 0.03 % nasal spray, Place 2 sprays into both nostrils every 12 (twelve) hours., Disp: 30 mL, Rfl: 12   JANUVIA  50 MG tablet, TAKE 1 TABLET(50 MG) BY MOUTH DAILY, Disp: 30 tablet, Rfl: 5   ketoconazole  (NIZORAL ) 2 % cream, Apply 1 Application topically daily as needed (fungus)., Disp: 60 g, Rfl: 0   levothyroxine  (SYNTHROID ) 125 MCG tablet, Take 1 tablet (125 mcg total) by mouth daily before breakfast., Disp: 90 tablet, Rfl: 1   lisinopril  (ZESTRIL ) 10 MG tablet, Take 1 tablet (10 mg total) by mouth daily., Disp: 90 tablet, Rfl: 3   MISC NATURAL PRODUCTS PO, Take 1 tablet by mouth daily. Super Beets otc supplement, Disp: , Rfl:    ondansetron  (ZOFRAN ) 8 MG tablet, take 1 tablet (8 MILLIGRAM total) by mouth every 8 (eight) hours as needed for nausea or vomiting., Disp: 30 tablet, Rfl: 0   pantoprazole  (PROTONIX ) 40 MG tablet, TAKE 1 TABLET(40 MG) BY MOUTH DAILY, Disp: 90 tablet, Rfl: 1   potassium chloride  (KLOR-CON  M) 10 MEQ tablet, TAKE 1 TABLET BY MOUTH EVERY MONDAY, WEDNESDAY, AND FRIDAY, Disp: 14 tablet, Rfl: 6   rosuvastatin  (CRESTOR ) 10 MG tablet, take 1 tablet (10 MILLIGRAM total) by mouth daily., Disp: 90 tablet, Rfl: 0  PHYSICAL EXAM ECOG FS:1 - Symptomatic but completely ambulatory    Vitals:   01/23/24 1040  BP: 92/76  Pulse: 70  Resp: 16  Temp: 97.9 F (36.6 C)  TempSrc: Temporal  SpO2: 100%  Weight: 137 lb 9 oz (62.4 kg)   Physical  Exam Vitals and nursing note reviewed.  Constitutional:      Appearance: She is not ill-appearing or toxic-appearing.  HENT:     Head: Normocephalic.     Right Ear: There is no impacted cerumen.     Left Ear: There is impacted cerumen.     Mouth/Throat:     Mouth: Mucous membranes are moist.  Eyes:     Conjunctiva/sclera: Conjunctivae normal.  Cardiovascular:     Rate and Rhythm: Normal rate and regular rhythm.     Pulses: Normal pulses.     Heart sounds: Murmur heard.  Pulmonary:     Effort: Pulmonary effort is normal. No respiratory distress.     Breath sounds: Normal breath sounds. No stridor. No wheezing or rales.  Abdominal:     General: There is no distension.     Tenderness: There is abdominal tenderness in the epigastric area.     Comments: No peritoneal signs  Musculoskeletal:     Cervical back: Normal range of motion.     Right lower leg: No edema.     Left lower leg: No edema.  Skin:    General: Skin is warm and dry.  Neurological:     Mental Status: She is alert.        LABORATORY DATA I have reviewed the data as listed    Latest Ref Rng & Units 01/23/2024   10:19 AM 01/09/2024    1:10 PM 11/22/2023    1:58 PM  CBC  WBC 4.0 - 10.5 K/uL 5.9  6.9  6.3   Hemoglobin 12.0 - 15.0 g/dL 88.3  89.1  88.2   Hematocrit 36.0 - 46.0 % 34.6  32.4  35.1   Platelets 150 - 400 K/uL 193  139  158  Latest Ref Rng & Units 01/23/2024   10:19 AM 01/09/2024    1:10 PM 11/22/2023    1:58 PM  CMP  Glucose 70 - 99 mg/dL 831  835  800   BUN 8 - 23 mg/dL 22  20  23    Creatinine 0.44 - 1.00 mg/dL 8.67  8.77  8.75   Sodium 135 - 145 mmol/L 137  136  138   Potassium 3.5 - 5.1 mmol/L 4.8  4.5  4.8   Chloride 98 - 111 mmol/L 100  99  100   CO2 22 - 32 mmol/L 32  32  33   Calcium  8.9 - 10.3 mg/dL 89.1  89.0  88.9   Total Protein 6.5 - 8.1 g/dL 7.7  7.3  7.8   Total Bilirubin 0.0 - 1.2 mg/dL 0.7  0.8  0.8   Alkaline Phos 38 - 126 U/L 78  70  75   AST 15 - 41 U/L 43  38   53   ALT 0 - 44 U/L 14  15  27         RADIOGRAPHIC STUDIES (from last 24 hours if applicable) I have personally reviewed the radiological images as listed and agreed with the findings in the report. No results found.      Visit Diagnosis: 1. Neuroendocrine cancer (HCC)   2. Nausea and vomiting, unspecified vomiting type   3. Generalized abdominal pain      Orders Placed This Encounter  Procedures   CT CHEST ABDOMEN PELVIS W CONTRAST    Standing Status:   Future    Expected Date:   01/24/2024    Expiration Date:   01/22/2025    If indicated for the ordered procedure, I authorize the administration of contrast media per Radiology protocol:   Yes    Does the patient have a contrast media/X-ray dye allergy?:   No    Preferred imaging location?:   [facility]    If indicated for the ordered procedure, I authorize the administration of oral contrast media per Radiology protocol:   Yes    All questions were answered. The patient knows to call the clinic with any problems, questions or concerns. No barriers to learning was detected.  A total of more than 30 minutes were spent on this encounter with face-to-face time and non-face-to-face time, including preparing to see the patient, ordering tests and/or medications, counseling the patient and coordination of care as outlined above.    Thank you for allowing me to participate in the care of this patient.    Karron Goens E  Walisiewicz, PA-C Department of Hematology/Oncology [facility] at [facility] Phone: [phone]  [phone]    01/23/2024 2:17 PM

## 2024-01-23 NOTE — Patient Instructions (Signed)
 Today, we discussed your recent symptoms of shortness of breath and abdominal pain. We reviewed your history of mitral valve disease and GERD, and noted your recent weight loss and nausea. We have ordered further imaging and made some adjustments to your medications to help manage your symptoms.  YOUR PLAN:  -ABDOMINAL PAIN WITH NAUSEA AND WEIGHT LOSS: Your abdominal pain, nausea, and weight loss could be due to several reasons, including GERD, pancreatic issues, or cancer-related causes. We have ordered a CT scan of your chest, abdomen, and pelvis to get a clearer picture. In the meantime, you should take over the counter Pepcid on the days you are not taking Protonix . If the CT scan does not show any cancer-related issues, we may refer you to a gastrointestinal specialist. - Once the CT scan is approved by your insurance we will notify you so that you can then call to schedule it. The Central Scheduling number is [phone].  -SHORTNESS OF BREATH: Your shortness of breath could be related to your heart or lungs. We have ordered a CT scan to check for any lung-related causes. If the scan does not show any issues, you may need to follow up with your cardiologist.  -MITRAL VALVE INSUFFICIENCY: Mitral valve insufficiency means your heart's mitral valve does not close properly, which can affect blood flow. Surgery is not recommended for your condition per the cardiology office note on 12/04/23, so it is important to manage your blood pressure. Please check your blood pressure daily at home and keep a log. If the top number is staying below 100 you should let your cardiologist know.  -GASTROESOPHAGEAL REFLUX DISEASE (GERD): GERD is a condition where stomach acid frequently flows back into the tube connecting your mouth and stomach. Since your symptoms are not significantly improving with your current medication, we recommend adding Pepcid on the days you are not taking Protonix .   -CERUMEN IMPACTION,  LEFT EAR: Cerumen impaction means you have a buildup of earwax in your left ear, which is causing hearing difficulties. We recommend using Debrox drops to soften the earwax. If your hearing does not improve, please follow up with us .

## 2024-01-24 ENCOUNTER — Inpatient Hospital Stay: Admitting: Dietician

## 2024-01-24 ENCOUNTER — Telehealth: Payer: Self-pay

## 2024-01-24 ENCOUNTER — Ambulatory Visit (HOSPITAL_COMMUNITY)
Admission: RE | Admit: 2024-01-24 | Discharge: 2024-01-24 | Disposition: A | Discharge status: Home / Self Care | Source: Ambulatory Visit | Attending: Physician Assistant | Admitting: Physician Assistant

## 2024-01-24 DIAGNOSIS — R112 Nausea with vomiting, unspecified: Secondary | ICD-10-CM | POA: Diagnosis present

## 2024-01-24 DIAGNOSIS — R1084 Generalized abdominal pain: Secondary | ICD-10-CM | POA: Diagnosis present

## 2024-01-24 DIAGNOSIS — C7A8 Other malignant neuroendocrine tumors: Secondary | ICD-10-CM | POA: Diagnosis present

## 2024-01-24 MED ORDER — IOHEXOL 300 MG/ML  SOLN
80.0000 mL | Freq: Once | INTRAMUSCULAR | Status: AC | PRN
  Administered 2024-01-24: 80 mL via INTRAVENOUS

## 2024-01-24 NOTE — Telephone Encounter (Signed)
 This RN called and spoke with [name] regarding scan results on behalf of Sabrina Deleon. Pt was made aware of results and recommended to f/u with PCP and/or cardiology concerning signs and symptoms. Pt verbalized understanding and was agreeable with plan. Pt expressed gratitude for the care provided in clinic on 01/23/2024 and for today's phone call.

## 2024-01-24 NOTE — Progress Notes (Signed)
 Nutrition Assessment  Reached out to patient at home telephone#.  Reason for Assessment: MST screen for weight loss.    ASSESSMENT: Patient is a 73 y.o. female with oncologic history of well differentiated neuroendocrine tumor, GI origin, grade 2 followed by Dr. Federico.  PMHx includes DM2, CKD, GERD, HTN, CAD,  IDA, HLP, and Osteopenia.  Patient reports abdominal pain so bad after eating she is vomiting past four months. Can't eat much "it won't go down."  Stretching out in her chair helps the pain.  Liquids easier to tolerate. Been drinking Latisha ONS with one scoop vanilla ice cream.  Loves fruit, especially honey crisp apples between meals, persimmons. Loves salads, raw vegetables, loves stews. Usually eats breakfast foods as brunch (sleeps late and has nausea in the mornings).  Eggs sandwich or cereal or mini bagel. loves stews, and soups will consider blending for better tolerance. Fluids: tea only when she eats out, decaf coffee once day, water  3 bottles a day, recently using some carbonated antioxidant sodas willing to d/c and assess effect on gas.   Medications: Januvia    Labs: 01/23/24  Ca 10.8, GFR 43, Creat 1.32, Hgb 11.6   Anthropometrics: weight loss 21# (13.3%) past 6 months    Height: 67 Weight:  01/23/24  137.6# 01/09/24  143# 12/11/23  145# UBW: 165# BMI: 20.92    Estimated Energy Needs   Kcals: 1900-2200 Protein: 63-76 (renal preservation) g Fluid: 2 L      NUTRITION DIAGNOSIS: Inadequate PO intake to meet increased nutrient needs, r/t cancer   INTERVENTION:  Relayed that nutrition services are wrap around service provided at no charge and encouraged continued communication if experiencing continued weight loss or any nutritional impact symptoms (NIS).  Encouraged her strong faith and power of prayer and prayer warriors (has long time best friend who is social support).  Educated on importance of adequate calorie and protein energy intake  with nutrient  dense foods when possible to maintain weight/strength.  Encouraged blended soups and smoothies for better tolerance.  Encouraged small frequent feeds and separating liquids from meals.  Suggested blending banana or applesauce with healthy fat in oral nutrition supplement to boost calories as she is using low calorie high pro formula would be more nutrient dense than ice cream.  Emailed tip for diarrhea and nausea contact information provided   MONITORING, EVALUATION, GOAL: weight, PO intake, Nutrition Impact Symptoms, labs Goal is weight maintenance  Next Visit: Remote 2 weeks  Micheline Craven, RDN, LDN Registered Dietitian, [facility] Part Time Remote (Usual office hours: Tuesday-Thursday) Cell: [phone]

## 2024-01-28 ENCOUNTER — Other Ambulatory Visit: Payer: Self-pay | Admitting: Nurse Practitioner

## 2024-01-28 ENCOUNTER — Telehealth: Payer: Self-pay | Admitting: Nurse Practitioner

## 2024-01-28 ENCOUNTER — Other Ambulatory Visit: Payer: Self-pay

## 2024-01-28 MED ORDER — CLONAZEPAM 0.5 MG PO TABS: ORAL_TABLET | ORAL | 2 refills | Status: AC

## 2024-01-28 NOTE — Telephone Encounter (Signed)
 Refill    clonazePAM  (KLONOPIN ) 0.5 MG tablet   [facility]

## 2024-01-30 ENCOUNTER — Inpatient Hospital Stay

## 2024-02-05 ENCOUNTER — Encounter: Payer: Self-pay | Admitting: Oncology

## 2024-02-06 ENCOUNTER — Inpatient Hospital Stay: Admitting: Dietician

## 2024-02-06 ENCOUNTER — Inpatient Hospital Stay

## 2024-02-06 VITALS — BP 131/58 | HR 67 | Resp 16

## 2024-02-06 DIAGNOSIS — C7A098 Malignant carcinoid tumors of other sites: Secondary | ICD-10-CM | POA: Diagnosis not present

## 2024-02-06 DIAGNOSIS — C7A8 Other malignant neuroendocrine tumors: Secondary | ICD-10-CM

## 2024-02-06 MED ORDER — LANREOTIDE ACETATE 120 MG/0.5ML ~~LOC~~ SOLN
120.0000 mg | Freq: Once | SUBCUTANEOUS | Status: AC
  Administered 2024-02-06: 120 mg via SUBCUTANEOUS
  Filled 2024-02-06: qty 0.5

## 2024-02-06 NOTE — Progress Notes (Signed)
 Nutrition Follow Up:  Reached out to patient at home telephone#.  Reason for Assessment: MST screen for weight loss.    ASSESSMENT: Patient is a 72 y.o. female with oncologic history of well differentiated neuroendocrine tumor, GI origin, grade 2 followed by Dr. Federico.  PMHx includes DM2, CKD, GERD, HTN, CAD,  IDA, HLP, and Osteopenia.  Patient reports Pepcid has helped quite a bit with her pain.  She states decreased nausea.  Pain still a concern, some days pain is bearable, overall not as debilitating.  Can't figure out any pattern with which foods if any are problematic.  Thinks overall she's able to eat a bit more. She had only one occurrence of vomiting which was spaghetti earlier this week.  Usual food now: Toast 2 eggs for breakfast, .. working well Sandwiches, fruit Fruit works very well and she's eating variety (applesauce, tangelos but not often, banana sometimes makes her burp, strawberries). Continues with [employer] "when she can get them down."   Fluids: remain the same she states she didn't think her carbonate beverages were r/t gas or bloating and still drinking.t  She admits she couldn't remember all that we talked about last visit and that she hasn't used her blender at all for soups or smoothies to improve digestion and tolerance.  Bowels now regular with use of senna.  Diarrhea resolved.  Medications: reviewed   Labs: no new labs since 01/23/24  Ca 10.8, GFR 43, Creat 1.32, Hgb 11.6, Mg    Anthropometrics: states weighed at home 134# today, prior weight loss 21# (13.3%) past 6 months    Height: 67 Weight:  02/06/24  134# at home 01/23/24  137.6# 01/09/24  143# 12/11/23  145# UBW: 165# BMI: 20.92    Estimated Energy Needs   Kcals: 1900-2200 Protein: 63-76 (renal preservation) g Fluid: 2 L      NUTRITION DIAGNOSIS: Inadequate PO intake to meet increased nutrient needs, r/t cancer   INTERVENTION:   Encouraged blended soups and smoothies for better  tolerance and palatability.  Emailed recipes.  Encouraged extra snacking with higher calorie fluids.  Reviewed magnesium sources of foods to increase. Emailed tip sheet.   MONITORING, EVALUATION, GOAL: weight, PO intake, Nutrition Impact Symptoms, labs Goal is weight maintenance  Next Visit: Remote next month  Micheline Craven, RDN, LDN Registered Dietitian, [facility] Part Time Remote (Usual office hours: Tuesday-Thursday) Cell: [phone]

## 2024-02-08 ENCOUNTER — Other Ambulatory Visit: Payer: Self-pay | Admitting: Family Medicine

## 2024-02-08 DIAGNOSIS — N1832 Chronic kidney disease, stage 3b: Secondary | ICD-10-CM

## 2024-02-12 ENCOUNTER — Other Ambulatory Visit: Payer: Self-pay | Admitting: Hematology and Oncology

## 2024-02-21 ENCOUNTER — Ambulatory Visit (INDEPENDENT_AMBULATORY_CARE_PROVIDER_SITE_OTHER)

## 2024-02-21 ENCOUNTER — Encounter (INDEPENDENT_AMBULATORY_CARE_PROVIDER_SITE_OTHER): Payer: Self-pay

## 2024-02-21 VITALS — BP 114/64 | HR 72 | Temp 98.0°F | Wt 134.0 lb

## 2024-02-21 DIAGNOSIS — H6122 Impacted cerumen, left ear: Secondary | ICD-10-CM

## 2024-02-21 DIAGNOSIS — J387 Other diseases of larynx: Secondary | ICD-10-CM

## 2024-02-21 MED ORDER — DEBROX 6.5 % OT SOLN: 5.0000 [drp] | Freq: Two times a day (BID) | OTIC | 0 refills | Status: AC

## 2024-02-25 NOTE — Progress Notes (Signed)
 HPI:   Discussed the use of AI scribe software for clinical note transcription with the patient, who gave verbal consent to proceed.  History of Present Illness Sabrina Deleon is a 73 year old female who presents with ear wax impaction and voice changes. She was referred by her family physician for ear wax impaction.  She has ear wax impaction in her left ear, confirmed by her family physician, causing significant hearing difficulties, especially when not using her hearing aids. The patient reports discomfort and occasional pain in the left ear. She uses Q-tips to clean her ears.  She has experienced a gradual decrease in her vocal range over the past couple of years. 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    PMH/Meds/All/SocHx/FamHx/ROS: Past Medical History:  Diagnosis Date   Anxiety    Aortic stenosis    Arthritis    Chronic kidney disease    Dr. Alphonsa LEOS- noted increase kidney function levels- took pt off Metformin  and started on Onglyza for Blood sugar control-pt being followed for this.   GERD (gastroesophageal reflux disease)    Gout    Hypertension    Hypothyroidism    Morbid obesity (HCC) 03/29/2018   Patient has elevated BMI with diabetes hyperlipidemia and hypertension   Pancreatitis    Type 2 diabetes mellitus (HCC)    Wears glasses    Wears partial dentures    Upper   Past Surgical History:  Procedure Laterality Date   BACK SURGERY     Lumbar   CARPAL TUNNEL RELEASE  11/02/2011   Procedure: CARPAL TUNNEL RELEASE;  Surgeon: Franky JONELLE Curia, MD;  Location: [facility];  Service: Orthopedics;  Laterality: Right;   CARPAL TUNNEL RELEASE Left 01/14/2015   Procedure: LEFT  CARPAL TUNNEL RELEASE;  Surgeon: Franky Curia, MD;  Location: [facility];  Service: Orthopedics;  Laterality: Left;   CHOLECYSTECTOMY     laparoscopic   COLONOSCOPY     COLONOSCOPY N/A 11/12/2015   Procedure: COLONOSCOPY;  Surgeon: Claudis RAYMOND Rivet, MD;  Location: [facility];  Service: Endoscopy;  Laterality: N/A;  9:30   ESOPHAGOGASTRODUODENOSCOPY N/A 11/12/2015   Procedure: ESOPHAGOGASTRODUODENOSCOPY (EGD);  Surgeon: Claudis RAYMOND Rivet, MD;  Location: [facility];  Service: Endoscopy;  Laterality: N/A;   REVERSE SHOULDER ARTHROPLASTY Right 11/04/2018   REVERSE SHOULDER ARTHROPLASTY Right 11/04/2018   Procedure: REVERSE SHOULDER ARTHROPLASTY;  Surgeon: Carolee Lynwood JINNY DOUGLAS, MD;  Location: [facility];  Service: Orthopedics;  Laterality: Right;   RIGHT/LEFT HEART CATH AND CORONARY ANGIOGRAPHY N/A 10/18/2017   Procedure: RIGHT/LEFT HEART CATH AND CORONARY ANGIOGRAPHY;  Surgeon: Verlin Lonni BIRCH, MD;  Location: [facility];  Service: Cardiovascular;  Laterality: N/A;   TOTAL HIP ARTHROPLASTY Left 03/22/2016   Procedure: LEFT TOTAL HIP ARTHROPLASTY ANTERIOR APPROACH;  Surgeon: Dempsey Moan, MD;  Location: [facility];  Service: Orthopedics;  Laterality: Left;   No family history of bleeding disorders, wound healing problems or difficulty with anesthesia.  Social Connections: Socially Integrated (01/08/2023)   Social Connection and Isolation Panel    Frequency of Communication with Friends and Family: More than three times a week    Frequency of Social  Gatherings with Friends and Family: More than three times a week    Attends Religious Services: More than 4 times per year    Active Member of Clubs or Organizations: Yes    Attends Engineer, Structural: More than 4 times per year    Marital Status: Married    Current Outpatient Medications:    allopurinol  (ZYLOPRIM ) 100 MG tablet, Take 1 tablet (100 mg total) by mouth 2 (two) times daily., Disp: 180 tablet, Rfl: 1    carbamide peroxide (DEBROX) 6.5 % OTIC solution, Place 5 drops into the left ear 2 (two) times daily for 7 days., Disp: 3.5 mL, Rfl: 0   Cholecalciferol (VITAMIN D) 50 MCG (2000 UT) tablet, Take 2,000 Units by mouth daily., Disp: , Rfl:    clobetasol  cream (TEMOVATE ) 0.05 %, Apply 1 Application topically 2 (two) times daily., Disp: 30 g, Rfl: 0   clonazePAM  (KLONOPIN ) 0.5 MG tablet, take 1 tablet at bedtime as needed for insomnia, Disp: 30 tablet, Rfl: 2   Colchicine  0.6 MG CAPS, Take one cap po BID prn gout flare up, Disp: 30 capsule, Rfl: 0   DULoxetine  (CYMBALTA ) 30 MG capsule, TAKE 1 CAPSULE ONCE DAILY, Disp: 90 capsule, Rfl: 2   ferrous sulfate  325 (65 FE) MG tablet, Take 325 mg by mouth daily with breakfast., Disp: , Rfl:    fluconazole  (DIFLUCAN ) 150 MG tablet, Take 150 mg by mouth once., Disp: , Rfl:    furosemide  (LASIX ) 40 MG tablet, take 1 tablet(40 mg) by mouth daily, Disp: 90 tablet, Rfl: 0   glucose blood (ONETOUCH ULTRA) test strip, 1 each by Other route as needed for other. for testing, Disp: 50 strip, Rfl: 5   ipratropium (ATROVENT ) 0.03 % nasal spray, Place 2 sprays into both nostrils every 12 (twelve) hours., Disp: 30 mL, Rfl: 12   JANUVIA  50 MG tablet, TAKE 1 TABLET ONCE DAILY, Disp: 30 tablet, Rfl: 0   ketoconazole  (NIZORAL ) 2 % cream, Apply 1 Application topically daily as needed (fungus)., Disp: 60 g, Rfl: 0   levothyroxine  (SYNTHROID ) 125 MCG tablet, Take 1 tablet (125 mcg total) by mouth daily before breakfast., Disp: 90 tablet, Rfl: 1   lisinopril  (ZESTRIL ) 10 MG tablet, Take 1 tablet (10 mg total) by mouth daily., Disp: 90 tablet, Rfl: 3   MISC NATURAL PRODUCTS PO, Take 1 tablet by mouth daily. Super Beets otc supplement, Disp: , Rfl:    ondansetron  (ZOFRAN ) 8 MG tablet, take 1 tablet (8 milligram total) by mouth every 8 (eight) hours as needed for nausea or vomiting., Disp: 30 tablet, Rfl: 0   pantoprazole  (PROTONIX ) 40 MG tablet, TAKE 1 TABLET(40 MG) BY MOUTH DAILY,  Disp: 90 tablet, Rfl: 1   potassium chloride  (KLOR-CON  M) 10 MEQ tablet, TAKE 1 TABLET BY MOUTH EVERY MONDAY, WEDNESDAY, AND FRIDAY, Disp: 14 tablet, Rfl: 6   potassium chloride  (KLOR-CON ) 10 MEQ tablet, Take by mouth., Disp: , Rfl:    rosuvastatin  (CRESTOR ) 10 MG tablet, take 1 tablet (10 MILLIGRAM total) by mouth daily., Disp: 90 tablet, Rfl: 0 A complete ROS was performed with pertinent positives/negatives noted in the HPI. The remainder of the ROS are negative.   Physical Exam:  BP 114/64 (BP Location: Right Arm, Patient Position: Sitting, Cuff Size: Normal)   Pulse 72   Temp 98 F (36.7 C)   Wt 134 lb (60.8 kg)   SpO2 98%   BMI 20.37 kg/m  General: Well developed, well nourished. No acute distress. Voice weak Head/Face:  Normocephalic. No sinus tenderness. Facial nerve intact and equal bilaterally. No facial lacerations. Eyes: PERRL, no scleral icterus or conjunctival hemorrhage. EOMI. Ears: No gross deformity. Normal external canal. Tympanic membrane in tact on right. Unable to visualize left TM due to cerumen impaction Hearing: Normal speech reception. Nose: No gross deformity or lesions. No purulent discharge. No turbinate hypertrophy. Mouth/Oropharynx: Lips without any lesions. No mucosal lesions within the oropharynx. No tonsillar enlargement, exudate, or lesions. Pharyngeal walls symmetrical. Uvula midline. Tongue midline without lesions. Larynx: See TFL if applicable Nasopharynx: See TFL if applicable Neck: Trachea midline. No masses. No thyromegaly or nodules palpated. No crepitus. Lymphatic: No lymphadenopathy in the neck. Respiratory: No stridor or distress. Room air. Cardiovascular: Regular rate and rhythm. Extremities: No edema or cyanosis. Warm and well-perfused. Skin: No scars or lesions on face or neck. Neurologic: CN II-XII grossly intact. Moving all extremities without gross abnormality. Other:  Independent Review of Additional Tests or  Records: None  Procedures: Flexible Fiberoptic Laryngoscopy Procedure Note  Date of procedure 02/25/2024 Pre-Op Diagnosis: decreased vocal range    Post Op Diagnosis: same Procedure: Flexible Fiberoptic Laryngoscopy CPT 31575 - Mod 25 Surgeon: Adah Malkin, D.O. Anesthesia: 4% Lidocaine  and Afrin  Findings:  - bilateral vocal cord motion without dysfunction - mild bilateral vocal cord atrophy without significant glottic gap Procedure Detail: After verbal consent was obtained from the patient, the patient was brought in an upright position, a fiberoptic nasal laryngoscope was then passed into the patient right nasal passage and left nasal passage, the right appeared to be more patent. It was passed along the floor of the nasal cavity to the nasopharynx. Torus tubarius was patent and the Fossa of Rosenmller was identified. The scope was then flexed caudally and advanced slowly through the nasopharynx, passed through the oropharynx, and down into the hypopharynx. The patient's oro- and nasopharynx were unremarkable with no signs of any gross lesions, edema, masses, or bleeding.  The base of tongue was visualized and no mass, ulceration or lesion was appreciated. The epiglottis did not demonstrate any mass, ulceration or lesion. The vallecula was also assessed with no mass, ulceration or lesion. The patient had good glottic closure upon phonation and no signs of aspiration or pooling of secretions. The patient was asked to inspire and expire with the true vocal folds vibrating normally and without evidence of vocal fold dysfunction. The true and false vocal cords, interarytenoid, AE folds, and arytenoids did not demonstrate any significant edema or erythema. The patient was then asked to valsalva, and the pyriform sinuses were assessed which were unremarkable. The airway was patent and there was no evidence of compromise. The scope was then slowly withdrawn from the patient. The patient tolerated the  procedure well and there were no complications.  Disposition: Stable  Impression & Plans: Assessment & Plan Impacted cerumen, left ear Impacted cerumen adhered to the eardrum, causing hearing difficulties. Removal attempt unsuccessful due to patient intolerance - Prescribed Debrox eardrops daily for one week to soften cerumen. - Scheduled follow-up in one week for ear cleaning.  Age-related dysphonia Decreased vocal range likely due to age-related changes in vocal cord structure. No abnormalities on examination. - Referred to speech language pathologist for voice therapy.  Follow-up in 1 week for cerumen removal.  Adah Malkin, DO [facility]

## 2024-02-26 ENCOUNTER — Telehealth (INDEPENDENT_AMBULATORY_CARE_PROVIDER_SITE_OTHER): Payer: Self-pay

## 2024-02-26 ENCOUNTER — Ambulatory Visit (INDEPENDENT_AMBULATORY_CARE_PROVIDER_SITE_OTHER)

## 2024-02-26 NOTE — Telephone Encounter (Signed)
 Patient called in to reschedule apt today due to not feeling well.

## 2024-03-05 ENCOUNTER — Inpatient Hospital Stay: Attending: Physician Assistant

## 2024-03-05 ENCOUNTER — Other Ambulatory Visit: Payer: Self-pay | Admitting: Family Medicine

## 2024-03-05 VITALS — BP 133/74 | HR 65 | Temp 98.0°F | Resp 16

## 2024-03-05 DIAGNOSIS — C7A098 Malignant carcinoid tumors of other sites: Secondary | ICD-10-CM | POA: Diagnosis present

## 2024-03-05 DIAGNOSIS — E3400 Carcinoid syndrome, unspecified: Secondary | ICD-10-CM | POA: Insufficient documentation

## 2024-03-05 DIAGNOSIS — C7A8 Other malignant neuroendocrine tumors: Secondary | ICD-10-CM

## 2024-03-05 DIAGNOSIS — N1832 Chronic kidney disease, stage 3b: Secondary | ICD-10-CM

## 2024-03-05 MED ORDER — LANREOTIDE ACETATE 120 MG/0.5ML ~~LOC~~ SOLN
120.0000 mg | Freq: Once | SUBCUTANEOUS | Status: AC
  Administered 2024-03-05: 13:00:00 120 mg via SUBCUTANEOUS
  Filled 2024-03-05: qty 0.5

## 2024-03-06 ENCOUNTER — Inpatient Hospital Stay: Attending: Physician Assistant | Admitting: Dietician

## 2024-03-06 ENCOUNTER — Telehealth: Payer: Self-pay

## 2024-03-06 DIAGNOSIS — E782 Mixed hyperlipidemia: Secondary | ICD-10-CM

## 2024-03-06 NOTE — Progress Notes (Signed)
 Pharmacy Quality Measure Review  This patient is appearing on a report for being at risk of failing the adherence measure for cholesterol (statin) medications this calendar year.   Medication: Rosuvastatin  10mg  Last fill date: 11/25 for 26 day supply  State she is adherent and has no issues with this medication. States she does pill packing/med adherence assistance through her current pharmacy, [facility] in [place], [place]. States this has worked well to keep her medications organized. All questions by the patient were answered.  Bhakti Labella, PharmD [facility] Pharmacist

## 2024-03-06 NOTE — Progress Notes (Signed)
 Nutrition Follow Up:  Reached out to patient at home telephone#.  ASSESSMENT: Patient is a 73 y.o. female with oncologic history of well differentiated neuroendocrine tumor, GI origin, grade 2 followed by Dr. Federico.  PMHx includes DM2, CKD, GERD, HTN, CAD,  IDA, HLD, and Osteopenia.  Patient reports more good days than bad. Lots of nausea and vomiting a few times per week. Vomiting early in morning.    Drinking strawberry Adkins protein drinks blends them with fruits and adds Stevia pack to make more palatable. FBS 130-150, no low blood sugars. Usual food now: Toast 2 eggs for breakfast, .. working well [employer], fruit Bowels still irregular with use of senna, but doesn't want diarrhea.  Medications: reviewed   Labs: no new labs since 01/23/24    Anthropometrics: states weighed at home 131# today, disappointment she can't gain some weight back  Height: 67 Weight:  12/18.25  131# 02/06/24  134# at home 01/23/24  137.6# 01/09/24  143# 12/11/23  145# UBW: 165# BMI: 20.92    Estimated Energy Needs   Kcals: 1900-2200 Protein: 63-76 (renal preservation) g Fluid: 2 L    NUTRITION DIAGNOSIS: Inadequate PO intake to meet increased nutrient needs, r/t cancer. ongoing  INTERVENTION:   Encouraged extra small snacks with healthy fats, loves humus and avocados.  Encouraged small frequent meals.  Limit Adkins shake to 1 per day, or half with blended fruits use at HS to allow room earlier.  Trial of 1/2 pop sickle early in morning to decrease nausea.  Fluids between feeds to goal    MONITORING, EVALUATION, GOAL: weight, PO intake, Nutrition Impact Symptoms, labs Goal is weight maintenance  Next Visit: Remote next month  Micheline Craven, RDN, LDN Registered Dietitian, [facility] Part Time Remote (Usual office hours: Tuesday-Thursday) Cell: [phone]

## 2024-03-08 ENCOUNTER — Other Ambulatory Visit: Payer: Self-pay | Admitting: Nurse Practitioner

## 2024-03-08 MED ORDER — CEPHALEXIN 500 MG PO CAPS: 500.0000 mg | ORAL_CAPSULE | Freq: Three times a day (TID) | ORAL | 0 refills | Status: AC

## 2024-03-12 ENCOUNTER — Ambulatory Visit (INDEPENDENT_AMBULATORY_CARE_PROVIDER_SITE_OTHER)

## 2024-03-24 ENCOUNTER — Ambulatory Visit: Admitting: Nurse Practitioner

## 2024-03-31 ENCOUNTER — Ambulatory Visit (INDEPENDENT_AMBULATORY_CARE_PROVIDER_SITE_OTHER)

## 2024-03-31 VITALS — BP 104/63 | HR 78 | Wt 129.0 lb

## 2024-03-31 DIAGNOSIS — H6122 Impacted cerumen, left ear: Secondary | ICD-10-CM

## 2024-03-31 DIAGNOSIS — J387 Other diseases of larynx: Secondary | ICD-10-CM

## 2024-03-31 DIAGNOSIS — H903 Sensorineural hearing loss, bilateral: Secondary | ICD-10-CM

## 2024-03-31 NOTE — Patient Instructions (Signed)
" °  VISIT SUMMARY: Today, you were seen for the management of impacted cerumen (earwax) in your right ear. You have a history of recurrent earwax buildup, which has caused discomfort and anxiety during previous cleanings. You have been using Debrox ear drops to soften the earwax before your visit. Additionally, you have an upcoming audiogram appointment to assess your hearing.  YOUR PLAN: -IMPACTED CERUMEN, RIGHT EAR: Impacted cerumen means that earwax has built up in your ear canal, causing blockage and potential hearing issues. Today, we successfully removed a large volume of earwax from your right ear using suction after softening it with Debrox. This has improved your hearing. We recommend continuing to use Debrox before future ear cleaning appointments and scheduling follow-up cleanings every six months. If the earwax buildup is minimal, we may extend the interval to nine or twelve months. Please bring a copy of your upcoming audiogram to your next visit.  INSTRUCTIONS: Please schedule a follow-up ear cleaning appointment in six months. If your earwax buildup is minimal, we may extend the interval to nine or twelve months. Also, remember to bring a copy of your upcoming audiogram to your next visit.  Call speech therapy to start treatment                       Contains text generated by [employer]bridge.                                 Contains text generated by Abridge.   "

## 2024-03-31 NOTE — Progress Notes (Addendum)
 Discussed the use of AI scribe software for clinical note transcription with the patient, who gave verbal consent to proceed.  History of Present Illness Sabrina Deleon is a 74 year old female with recurrent cerumen impaction who presents for management of impacted cerumen in the right >left ear.  Cerumen Impaction: - Recurrent cerumen impaction, predominantly affecting the right ear - Prior ear cleaning was difficult and uncomfortable, described as rough, rough - Significant anxiety regarding ear cleaning procedures - Used Debrox ear drops nightly for an extended period prior to this visit to soften cerumen - Return visit delayed due to personal circumstances - Wears a hearing aid, which was advised to remain in place during the visit  Hearing Loss: - No recent audiogram performed - Upcoming audiogram appointment scheduled - Advised to bring audiogram results to next visit  Otolaryngologic Symptoms: - No current nasal or throat symptoms  Voice Changes: [name] Stabs with decreased vocal range - Previously able to sing up to E above middle C, now only able to reach G and A comfortably - Attributes decreased range to reduced frequency of singing since staying home with her child, though continues to sing regularly  Physical Exam HEENT: Atraumatic, normocephalic. External ears with no lesions. Cerumen removed from left ear with suction. Right ear examined, no significant cerumen. Tympanic membrane with normal landmarks. Nose with midline septum and inferior turbinate hypertrophy.  Procedure: Bilateral ear microscopy and cerumen removal using microscope (CPT [phone]) - Mod 25 Pre-procedure diagnosis: Cerumen impaction external ear, left Post-procedure diagnosis: same Indication: left cerumen impaction; given patient's otologic complaints and history as well as for improved and comprehensive examination of external ear and tympanic membrane, bilateral otologic examination using microscope was  performed and impacted cerumen removed from the left  Procedure: Patient was placed semi-recumbent. Both ear canals were examined using the microscope with findings above. Cerumen removed on left using suction and currette with improvement in EAC examination and patency. Left: EAC was patent. TM was intact . Middle ear was aerated. Drainage: absent Right: EAC was patent. TM was intact . Middle ear was aerated . Drainage: absent Patient tolerated the procedure well. Reported hearing improvement. Assessment & Plan Impacted cerumen, left  ear Recurrent cerumen impaction effectively managed with Debrox and suction. Large volume removed, improving hearing. Minimal residual cerumen. - Removed impacted cerumen from the left ear via suction after Debrox softening. - Advised use of Debrox prior to future ear cleaning appointments. - Recommended follow-up ear cleaning every six months, with possible extension to nine or twelve months if cerumen accumulation is minimal. - Instructed her to bring a copy of her upcoming audiogram to the next visit for documentation.  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  SNHL - bring in copy of audiogram - continue HA

## 2024-04-01 NOTE — Progress Notes (Signed)
 " [name] [phone]   [phone]  PROGRESS NOTE  Patient Care Team: Alphonsa Glendia LABOR, MD as PCP - General (Family Medicine) Debera Jayson MATSU, MD as PCP - Cardiology (Cardiology)  Hematological/Oncological History # Well Differentiated Neuroendocrine Tumor, GI origin. Grade 2.  11/29/2021: Patient underwent CT angiogram C/A/P prior to consideration of TAVR for severe aortic stenosis, noted to have bulky central mesenteric soft tissue mass that has increased in 2019.  Additionally there was new widespread mild to moderate retroperitoneal, retrocrural, mediastinal and bilateral hilar lymphadenopathy 12/15/2021: NM PET CT Scan showed hypermetabolic mesenteric mass with hypermetabolic adenopathy in the small bowel mesentery, abdominal retroperitoneum and retrocrural stations. 12/28/2021: IR guided biopsy of the mesenteric mass was consistent with well differentiated neuroendocrine tumor 01/10/2022: start of lanreotide 120mg  IM q 28 days.   Interval History:  Sabrina Deleon 74 y.o. female with medical history significant for newly diagnosed neuroendocrine tumor who presents for a follow up visit. The patient's last visit was on 01/09/2024. In the interim since the last visit she continued lanreotide treatment.  On exam today Sabrina Deleon reports she has been well overall in the interim since our last visit with the exception of a bout of pneumonia and dehydration.  She reports her whole family has been affected.  She reports that she has been however tolerating her lanreotide treatments well with some occasional bouts of nausea and abdominal discomfort but no vomiting or diarrhea.  She reports that her stomach is little more sensitive and she has to be cautious about what she eats.  She reports that she has been losing some weight and is down about 3 pounds since December.  She reports that she reports her blood pressures are normally quite good and surprised by her  numbers today.  She reports she has also had some bouts of constipation.  Otherwise she denies any fevers, chills, sweats.  Full 10 point ROS is otherwise negative.  MEDICAL HISTORY:  Past Medical History:  Diagnosis Date   Anxiety    Aortic stenosis    Arthritis    Chronic kidney disease    Dr. Alphonsa LEOS- noted increase kidney function levels- took pt off Metformin  and started on Onglyza for Blood sugar control-pt being followed for this.   GERD (gastroesophageal reflux disease)    Gout    Hypertension    Hypothyroidism    Morbid obesity (HCC) 03/29/2018   Patient has elevated BMI with diabetes hyperlipidemia and hypertension   Pancreatitis    Type 2 diabetes mellitus (HCC)    Wears glasses    Wears partial dentures    Upper    SURGICAL HISTORY: Past Surgical History:  Procedure Laterality Date   BACK SURGERY     Lumbar   CARPAL TUNNEL RELEASE  11/02/2011   Procedure: CARPAL TUNNEL RELEASE;  Surgeon: Franky JONELLE Curia, MD;  Location: [facility];  Service: Orthopedics;  Laterality: Right;   CARPAL TUNNEL RELEASE Left 01/14/2015   Procedure: LEFT CARPAL TUNNEL RELEASE;  Surgeon: Franky Curia, MD;  Location: [facility];  Service: Orthopedics;  Laterality: Left;   CHOLECYSTECTOMY     laparoscopic   COLONOSCOPY     COLONOSCOPY N/A 11/12/2015   Procedure: COLONOSCOPY;  Surgeon: Claudis RAYMOND Rivet, MD;  Location: [facility];  Service: Endoscopy;  Laterality: N/A;  9:30   ESOPHAGOGASTRODUODENOSCOPY N/A 11/12/2015   Procedure: ESOPHAGOGASTRODUODENOSCOPY (EGD);  Surgeon: Claudis RAYMOND Rivet, MD;  Location: [facility];  Service: Endoscopy;  Laterality: N/A;   REVERSE SHOULDER ARTHROPLASTY Right 11/04/2018   REVERSE SHOULDER ARTHROPLASTY Right 11/04/2018   Procedure: REVERSE SHOULDER ARTHROPLASTY;  Surgeon: Carolee Lynwood JINNY DOUGLAS, MD;  Location: [facility];  Service: Orthopedics;  Laterality: Right;   RIGHT/LEFT HEART CATH AND CORONARY ANGIOGRAPHY N/A 10/18/2017    Procedure: RIGHT/LEFT HEART CATH AND CORONARY ANGIOGRAPHY;  Surgeon: Verlin Lonni BIRCH, MD;  Location: [facility];  Service: Cardiovascular;  Laterality: N/A;   TOTAL HIP ARTHROPLASTY Left 03/22/2016   Procedure: LEFT TOTAL HIP ARTHROPLASTY ANTERIOR APPROACH;  Surgeon: Dempsey Moan, MD;  Location: [facility];  Service: Orthopedics;  Laterality: Left;    SOCIAL HISTORY: Social History   Socioeconomic History   Marital status: Married    Spouse name: Not on file   Number of children: Not on file   Years of education: Not on file   Highest education level: Some college, no degree  Occupational History   Not on file  Tobacco Use   Smoking status: Former    Current packs/day: 0.00    Types: Cigarettes    Quit date: 10/29/1981    Years since quitting: 42.4   Smokeless tobacco: Never  Vaping Use   Vaping status: Never Used  Substance and Sexual Activity   Alcohol use: Yes    Comment: occasionally   Drug use: No   Sexual activity: Not Currently  Other Topics Concern   Not on file  Social History Narrative   Not on file   Social Drivers of Health   Tobacco Use: Medium Risk (02/21/2024)   Patient History    Smoking Tobacco Use: Former    Smokeless Tobacco Use: Never    Passive Exposure: Not on Actuary Strain: Low Risk (01/08/2023)   Overall Financial Resource Strain (CARDIA)    Difficulty of Paying Living Expenses: Not hard at all  Food Insecurity: No Food Insecurity (01/08/2023)   Hunger Vital Sign    Worried About Running Out of Food in the Last Year: Never true    Ran Out of Food in the Last Year: Never true  Transportation Needs: No Transportation Needs (01/08/2023)   PRAPARE - Administrator, Civil Service (Medical): No    Lack of Transportation (Non-Medical): No  Physical Activity: Unknown (01/08/2023)   Exercise Vital Sign    Days of Exercise per Week: 0 days    Minutes of Exercise per Session: Not on file  Recent Concern:  Physical Activity - Inactive (01/08/2023)   Exercise Vital Sign    Days of Exercise per Week: 0 days    Minutes of Exercise per Session: 20 min  Stress: No Stress Concern Present (01/08/2023)   [employer] of Occupational Health - Occupational Stress Questionnaire    Feeling of Stress : Only a little  Social Connections: Socially Integrated (01/08/2023)   Social Connection and Isolation Panel    Frequency of Communication with Friends and Family: More than three times a week    Frequency of Social Gatherings with Friends and Family: More than three times a week    Attends Religious Services: More than 4 times per year    Active Member of Clubs or Organizations: Yes    Attends Banker Meetings: More than 4 times per year    Marital Status: Married  Catering Manager Violence: Not on file  Depression (PHQ2-9): Low Risk (01/23/2024)   Depression (PHQ2-9)    PHQ-2 Score: 2  Recent Concern: Depression (PHQ2-9) - High Risk (  12/11/2023)   Depression (PHQ2-9)    PHQ-2 Score: 11  Alcohol Screen: Low Risk (01/08/2023)   Alcohol Screen    Last Alcohol Screening Score (AUDIT): 1  Housing: Low Risk (01/08/2023)   Housing    Last Housing Risk Score: 0  Utilities: Not on file  Health Literacy: Not on file    FAMILY HISTORY: Family History  Problem Relation Age of Onset   Breast cancer Mother    Lung cancer Mother        likely recurrent breast cancer   Breast cancer Paternal Grandmother    Head & neck cancer Nephew     ALLERGIES:  is allergic to bee venom.  MEDICATIONS:  Current Outpatient Medications  Medication Sig Dispense Refill   allopurinol  (ZYLOPRIM ) 100 MG tablet Take 1 tablet (100 mg total) by mouth 2 (two) times daily. 180 tablet 1   cephALEXin  (KEFLEX ) 500 MG capsule Take 1 capsule (500 mg total) by mouth 3 (three) times daily. 15 capsule 0   Cholecalciferol (VITAMIN D) 50 MCG (2000 UT) tablet Take 2,000 Units by mouth daily.     clobetasol  cream  (TEMOVATE ) 0.05 % Apply 1 Application topically 2 (two) times daily. 30 g 0   clonazePAM  (KLONOPIN ) 0.5 MG tablet take 1 tablet at bedtime as needed for insomnia 30 tablet 2   Colchicine  0.6 MG CAPS Take one cap po BID prn gout flare up 30 capsule 0   DULoxetine  (CYMBALTA ) 30 MG capsule take 1 capsule once daily 30 capsule 1   ferrous sulfate  325 (65 FE) MG tablet Take 325 mg by mouth daily with breakfast.     fluconazole  (DIFLUCAN ) 150 MG tablet Take 150 mg by mouth once.     furosemide  (LASIX ) 40 MG tablet take 1 tablet(40 mg) by mouth daily 90 tablet 0   glucose blood (ONETOUCH ULTRA) test strip 1 each by Other route as needed for other. for testing 50 strip 5   ipratropium (ATROVENT ) 0.03 % nasal spray Place 2 sprays into both nostrils every 12 (twelve) hours. 30 mL 12   JANUVIA  50 MG tablet take 1 tablet once daily 30 tablet 0   ketoconazole  (NIZORAL ) 2 % cream Apply 1 Application topically daily as needed (fungus). 60 g 0   levothyroxine  (SYNTHROID ) 125 MCG tablet Take 1 tablet (125 mcg total) by mouth daily before breakfast. 90 tablet 1   lisinopril  (ZESTRIL ) 10 MG tablet Take 1 tablet (10 mg total) by mouth daily. 90 tablet 3   MISC NATURAL PRODUCTS PO Take 1 tablet by mouth daily. Super Beets otc supplement     ondansetron  (ZOFRAN ) 8 MG tablet take 1 tablet (8 milligram total) by mouth every 8 (eight) hours as needed for nausea or vomiting. 30 tablet 0   pantoprazole  (PROTONIX ) 40 MG tablet TAKE 1 TABLET(40 MG) BY MOUTH DAILY 90 tablet 1   potassium chloride  (KLOR-CON  M) 10 MEQ tablet TAKE 1 TABLET BY MOUTH EVERY MONDAY, WEDNESDAY, AND FRIDAY 14 tablet 6   potassium chloride  (KLOR-CON ) 10 MEQ tablet Take by mouth.     rosuvastatin  (CRESTOR ) 10 MG tablet take 1 tablet (10 MILLIGRAM total) by mouth daily. 90 tablet 0   No current facility-administered medications for this visit.    REVIEW OF SYSTEMS:   All other systems were reviewed with the patient and are negative.  PHYSICAL  EXAMINATION: ECOG PERFORMANCE STATUS: 1 - Symptomatic but completely ambulatory  Vitals:   04/02/24 1513 04/02/24 1522  BP: (!) 149/54 (!) 117/57  Pulse: 65   Resp: 16   Temp: 97.7 F (36.5 C)   SpO2: 100%       Filed Weights   04/02/24 1513  Weight: 131 lb 4.8 oz (59.6 kg)     GENERAL: Well-appearing elderly Caucasian female, alert, no distress and comfortable SKIN: skin color, texture, turgor are normal, no rashes or significant lesions.  Erythematous rash noted around her rectal folds consistent with fungal process. EYES: conjunctiva are pink and non-injected, sclera clear LUNGS: clear to auscultation and percussion with normal breathing effort HEART: regular rate & rhythm and no murmurs and no lower extremity edema Musculoskeletal: no cyanosis of digits and no clubbing  PSYCH: alert & oriented x 3, fluent speech NEURO: no focal motor/sensory deficits  LABORATORY DATA:  I have reviewed the data as listed    Latest Ref Rng & Units 04/02/2024    2:42 PM 01/23/2024   10:19 AM 01/09/2024    1:10 PM  CBC  WBC 4.0 - 10.5 K/uL 10.2  5.9  6.9   Hemoglobin 12.0 - 15.0 g/dL 88.1  88.3  89.1   Hematocrit 36.0 - 46.0 % 35.0  34.6  32.4   Platelets 150 - 400 K/uL 212  193  139        Latest Ref Rng & Units 04/02/2024    2:42 PM 01/23/2024   10:19 AM 01/09/2024    1:10 PM  CMP  Glucose 70 - 99 mg/dL 837  831  835   BUN 8 - 23 mg/dL 25  22  20    Creatinine 0.44 - 1.00 mg/dL 8.65  8.67  8.77   Sodium 135 - 145 mmol/L 139  137  136   Potassium 3.5 - 5.1 mmol/L 4.6  4.8  4.5   Chloride 98 - 111 mmol/L 99  100  99   CO2 22 - 32 mmol/L 28  32  32   Calcium  8.9 - 10.3 mg/dL 89.3  89.1  89.0   Total Protein 6.5 - 8.1 g/dL 7.8  7.7  7.3   Total Bilirubin 0.0 - 1.2 mg/dL 0.7  0.7  0.8   Alkaline Phos 38 - 126 U/L 85  78  70   AST 15 - 41 U/L 61  43  38   ALT 0 - 44 U/L 24  14  15     RADIOGRAPHIC STUDIES: No results found.  ASSESSMENT & PLAN Sabrina Deleon is a 74 y.o. y.o.  female with medical history significant for newly diagnosed neuroendocrine tumor who presents for a follow up visit.  #Metastatic Well Differentiated Neuroendocrine Tumor of the GI Tract -- CT imaging and PET CT scan confirmed widespread lymphadenopathy consistent with metastatic spread of well-differentiated neuroendocrine tumor. -- Biopsy confirms this is a well differentiated neuroendocrine tumor of the GI tract. PLAN:  -- Today we will proceed with lanreotide 120 mg IM q. 28 days. --labs today show WBC 10.2, Hgb 11.8, MCV 92.8, Plt 212.  LFTs normal. --Last CT CAP from 10/24/2023 shows stable disease without any evidence of progression.  Next CT scan will be due in May 2026. -- Have patient return to clinic in 12 weeks time with interval monthly shots.   # Diarrhea versus Constipation: -- Advised patient to take supportive medications including Imodium when she has diarrhea and stool softeners for constipation.    # Elevated LFTs --occurring periodically.  --continue to monitor   #Fungal rash: -- Sent prescription for oral Diflucan  and advised to apply over-the-counter antifungal  cream to affected area.  #CKD #normocytic anemia: --Overall creatinine and Hgb levels are stable.  --Monitor for now.    No orders of the defined types were placed in this encounter.   All questions were answered. The patient knows to call the clinic with any problems, questions or concerns.  I have spent a total of 30 minutes minutes of face-to-face and non-face-to-face time, preparing to see the patient, performing a medically appropriate examination, counseling and educating the patient, documenting clinical information in the electronic health record, and care coordination.   Norleen IVAR Kidney, MD Department of Hematology/Oncology [facility] at [facility] Phone: [phone] Pager: [phone] Email: [email or web address]   04/08/2024 2:46 PM "

## 2024-04-02 ENCOUNTER — Inpatient Hospital Stay: Admitting: Hematology and Oncology

## 2024-04-02 ENCOUNTER — Inpatient Hospital Stay: Attending: Physician Assistant

## 2024-04-02 ENCOUNTER — Inpatient Hospital Stay

## 2024-04-02 VITALS — BP 117/57 | HR 65 | Temp 97.7°F | Resp 16 | Wt 131.3 lb

## 2024-04-02 DIAGNOSIS — C7A098 Malignant carcinoid tumors of other sites: Secondary | ICD-10-CM | POA: Diagnosis present

## 2024-04-02 DIAGNOSIS — C7A8 Other malignant neuroendocrine tumors: Secondary | ICD-10-CM | POA: Diagnosis not present

## 2024-04-02 DIAGNOSIS — D631 Anemia in chronic kidney disease: Secondary | ICD-10-CM | POA: Insufficient documentation

## 2024-04-02 DIAGNOSIS — R590 Localized enlarged lymph nodes: Secondary | ICD-10-CM | POA: Diagnosis not present

## 2024-04-02 DIAGNOSIS — R112 Nausea with vomiting, unspecified: Secondary | ICD-10-CM | POA: Diagnosis not present

## 2024-04-02 DIAGNOSIS — N189 Chronic kidney disease, unspecified: Secondary | ICD-10-CM | POA: Insufficient documentation

## 2024-04-02 LAB — CBC WITH DIFFERENTIAL (CANCER CENTER ONLY)
Abs Immature Granulocytes: 0.04 K/uL (ref 0.00–0.07)
Basophils Absolute: 0 K/uL (ref 0.0–0.1)
Basophils Relative: 0 %
Eosinophils Absolute: 0.1 K/uL (ref 0.0–0.5)
Eosinophils Relative: 1 %
HCT: 35 % — ABNORMAL LOW (ref 36.0–46.0)
Hemoglobin: 11.8 g/dL — ABNORMAL LOW (ref 12.0–15.0)
Immature Granulocytes: 0 %
Lymphocytes Relative: 13 %
Lymphs Abs: 1.3 K/uL (ref 0.7–4.0)
MCH: 31.3 pg (ref 26.0–34.0)
MCHC: 33.7 g/dL (ref 30.0–36.0)
MCV: 92.8 fL (ref 80.0–100.0)
Monocytes Absolute: 0.7 K/uL (ref 0.1–1.0)
Monocytes Relative: 6 %
Neutro Abs: 8 K/uL — ABNORMAL HIGH (ref 1.7–7.7)
Neutrophils Relative %: 80 %
Platelet Count: 212 K/uL (ref 150–400)
RBC: 3.77 MIL/uL — ABNORMAL LOW (ref 3.87–5.11)
RDW: 13.7 % (ref 11.5–15.5)
WBC Count: 10.2 K/uL (ref 4.0–10.5)
nRBC: 0 % (ref 0.0–0.2)

## 2024-04-02 LAB — CMP (CANCER CENTER ONLY)
ALT: 24 U/L (ref 0–44)
AST: 61 U/L — ABNORMAL HIGH (ref 15–41)
Albumin: 4.5 g/dL (ref 3.5–5.0)
Alkaline Phosphatase: 85 U/L (ref 38–126)
Anion gap: 12 (ref 5–15)
BUN: 25 mg/dL — ABNORMAL HIGH (ref 8–23)
CO2: 28 mmol/L (ref 22–32)
Calcium: 10.6 mg/dL — ABNORMAL HIGH (ref 8.9–10.3)
Chloride: 99 mmol/L (ref 98–111)
Creatinine: 1.34 mg/dL — ABNORMAL HIGH (ref 0.44–1.00)
GFR, Estimated: 42 mL/min — ABNORMAL LOW
Glucose, Bld: 162 mg/dL — ABNORMAL HIGH (ref 70–99)
Potassium: 4.6 mmol/L (ref 3.5–5.1)
Sodium: 139 mmol/L (ref 135–145)
Total Bilirubin: 0.7 mg/dL (ref 0.0–1.2)
Total Protein: 7.8 g/dL (ref 6.5–8.1)

## 2024-04-02 MED ORDER — ONDANSETRON HCL 8 MG PO TABS: 8.0000 mg | ORAL_TABLET | Freq: Three times a day (TID) | ORAL | 0 refills | Status: DC | PRN

## 2024-04-02 MED ORDER — LANREOTIDE ACETATE 120 MG/0.5ML ~~LOC~~ SOLN
120.0000 mg | Freq: Once | SUBCUTANEOUS | Status: AC
  Administered 2024-04-02: 120 mg via SUBCUTANEOUS
  Filled 2024-04-02: qty 0.5

## 2024-04-03 ENCOUNTER — Telehealth: Payer: Self-pay | Admitting: Physician Assistant

## 2024-04-03 NOTE — Telephone Encounter (Signed)
 Cannot reach pt after attempting to, multiple times. Left a voicemail regarding schedules appts.

## 2024-04-07 ENCOUNTER — Other Ambulatory Visit: Payer: Self-pay | Admitting: Family Medicine

## 2024-04-07 ENCOUNTER — Other Ambulatory Visit: Payer: Self-pay | Admitting: Hematology and Oncology

## 2024-04-07 DIAGNOSIS — N1832 Chronic kidney disease, stage 3b: Secondary | ICD-10-CM

## 2024-04-08 ENCOUNTER — Encounter: Payer: Self-pay | Admitting: Hematology and Oncology

## 2024-04-10 ENCOUNTER — Inpatient Hospital Stay: Admitting: Dietician

## 2024-04-10 NOTE — Progress Notes (Signed)
 Nutrition Follow Up:  Reached out to patient at home telephone#.  ASSESSMENT: Patient is a 74 y.o. female with oncologic history of well differentiated neuroendocrine tumor, GI origin, grade 2 followed by Dr. Federico.  PMHx includes DM2, CKD, GERD, HTN, CAD,  IDA, HLD, and Osteopenia.  Patient reports more good days than bad. Spouses had PNA and was hospitalized which had patient schedule for feeds altered.  She continues to lose weight.  She is not using the [employer] much anymore as they don't taste good.  Fruit and ice cream are the foods that taste the best.  Medications: reviewed   Labs:  04/02/24  BUN 25, Creat 1.34, GFR 42, Hgb 11.8  Anthropometrics: states weighed at home 127# today, disappointment she can't gain some weight back  Height: 67 Weight:  04/02/24  131.3 12/18.25  131# 02/06/24  134# at home 01/23/24  137.6# 01/09/24  143# 12/11/23  145# UBW: 165# BMI: 19.96    Estimated Energy Needs   Kcals: 1900-2200 Protein: 63-76 (renal preservation) g Fluid: 2 L    NUTRITION DIAGNOSIS: Inadequate PO intake to meet increased nutrient needs, r/t cancer. ongoing  INTERVENTION:   Encouraged extra small snacks with healthy fats, loves avocados, cottage cheese and fruit.  Encouraged small frequent meals.  High protein milkshake with extra dry milk powder  Fluids between feeds to goal.  Emailed snack ideas.   MONITORING, EVALUATION, GOAL: weight, PO intake, Nutrition Impact Symptoms, labs Goal is weight maintenance  Next Visit: Remote next month prefers appointments prior to 2:30 to allow pick up of grand kids  Micheline Craven, RDN, LDN Registered Dietitian, [facility] Part Time Remote (Usual office hours: Tuesday-Thursday) Cell: [phone]

## 2024-04-30 ENCOUNTER — Inpatient Hospital Stay

## 2024-04-30 ENCOUNTER — Inpatient Hospital Stay: Attending: Physician Assistant

## 2024-05-01 ENCOUNTER — Inpatient Hospital Stay: Admitting: Dietician

## 2024-05-13 ENCOUNTER — Ambulatory Visit: Admitting: Family Medicine

## 2024-05-28 ENCOUNTER — Inpatient Hospital Stay

## 2024-05-28 ENCOUNTER — Inpatient Hospital Stay: Attending: Physician Assistant

## 2024-06-03 ENCOUNTER — Ambulatory Visit: Admitting: Cardiology

## 2024-06-25 ENCOUNTER — Inpatient Hospital Stay: Admitting: Physician Assistant

## 2024-06-25 ENCOUNTER — Inpatient Hospital Stay

## 2024-06-25 ENCOUNTER — Inpatient Hospital Stay: Attending: Physician Assistant

## 2024-07-23 ENCOUNTER — Inpatient Hospital Stay

## 2024-07-23 ENCOUNTER — Inpatient Hospital Stay: Attending: Physician Assistant

## 2024-09-29 ENCOUNTER — Ambulatory Visit (INDEPENDENT_AMBULATORY_CARE_PROVIDER_SITE_OTHER)
